# Patient Record
Sex: Female | Born: 1935 | Race: White | Hispanic: No | Smoking: Never smoker
Health system: Southern US, Community
[De-identification: ages and names within clinical notes are randomized; demographics above are authoritative.]

## PROBLEM LIST (undated history)

## (undated) DIAGNOSIS — E119 Type 2 diabetes mellitus without complications: Secondary | ICD-10-CM

## (undated) DIAGNOSIS — K219 Gastro-esophageal reflux disease without esophagitis: Secondary | ICD-10-CM

## (undated) DIAGNOSIS — R203 Hyperesthesia: Secondary | ICD-10-CM

## (undated) DIAGNOSIS — J302 Other seasonal allergic rhinitis: Secondary | ICD-10-CM

## (undated) DIAGNOSIS — C50919 Malignant neoplasm of unspecified site of unspecified female breast: Secondary | ICD-10-CM

## (undated) DIAGNOSIS — Z803 Family history of malignant neoplasm of breast: Secondary | ICD-10-CM

## (undated) DIAGNOSIS — M1990 Unspecified osteoarthritis, unspecified site: Secondary | ICD-10-CM

## (undated) DIAGNOSIS — E538 Deficiency of other specified B group vitamins: Secondary | ICD-10-CM

## (undated) DIAGNOSIS — Z923 Personal history of irradiation: Secondary | ICD-10-CM

## (undated) DIAGNOSIS — I493 Ventricular premature depolarization: Secondary | ICD-10-CM

## (undated) DIAGNOSIS — C50911 Malignant neoplasm of unspecified site of right female breast: Secondary | ICD-10-CM

## (undated) DIAGNOSIS — Z800 Family history of malignant neoplasm of digestive organs: Secondary | ICD-10-CM

## (undated) DIAGNOSIS — I491 Atrial premature depolarization: Secondary | ICD-10-CM

## (undated) HISTORY — DX: Family history of malignant neoplasm of breast: Z80.3

## (undated) HISTORY — DX: Deficiency of other specified B group vitamins: E53.8

## (undated) HISTORY — DX: Atrial premature depolarization: I49.1

## (undated) HISTORY — DX: Ventricular premature depolarization: I49.3

## (undated) HISTORY — PX: CATARACT EXTRACTION: SUR2

## (undated) HISTORY — DX: Family history of malignant neoplasm of digestive organs: Z80.0

---
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
## 1977-04-05 HISTORY — PX: ABDOMINAL HYSTERECTOMY: SHX81

## 1998-06-05 ENCOUNTER — Other Ambulatory Visit: Admission: RE | Admit: 1998-06-05 | Discharge: 1998-06-05 | Payer: Self-pay | Admitting: Obstetrics and Gynecology

## 1998-10-02 ENCOUNTER — Ambulatory Visit (HOSPITAL_COMMUNITY): Admission: RE | Admit: 1998-10-02 | Discharge: 1998-10-02 | Payer: Self-pay | Admitting: Cardiovascular Disease

## 1999-06-08 ENCOUNTER — Other Ambulatory Visit: Admission: RE | Admit: 1999-06-08 | Discharge: 1999-06-08 | Payer: Self-pay | Admitting: Obstetrics and Gynecology

## 2000-06-09 ENCOUNTER — Other Ambulatory Visit: Admission: RE | Admit: 2000-06-09 | Discharge: 2000-06-09 | Payer: Self-pay | Admitting: Obstetrics and Gynecology

## 2001-02-17 ENCOUNTER — Encounter: Payer: Self-pay | Admitting: Internal Medicine

## 2001-02-17 ENCOUNTER — Ambulatory Visit (HOSPITAL_COMMUNITY): Admission: RE | Admit: 2001-02-17 | Discharge: 2001-02-17 | Payer: Self-pay | Admitting: Internal Medicine

## 2001-06-02 ENCOUNTER — Encounter: Payer: Self-pay | Admitting: Internal Medicine

## 2001-06-02 ENCOUNTER — Ambulatory Visit (HOSPITAL_COMMUNITY): Admission: RE | Admit: 2001-06-02 | Discharge: 2001-06-02 | Payer: Self-pay | Admitting: Internal Medicine

## 2001-06-13 ENCOUNTER — Other Ambulatory Visit: Admission: RE | Admit: 2001-06-13 | Discharge: 2001-06-13 | Payer: Self-pay | Admitting: Obstetrics and Gynecology

## 2001-06-29 ENCOUNTER — Encounter: Payer: Self-pay | Admitting: Surgery

## 2001-06-29 ENCOUNTER — Ambulatory Visit (HOSPITAL_COMMUNITY): Admission: RE | Admit: 2001-06-29 | Discharge: 2001-06-29 | Payer: Self-pay | Admitting: Cardiology

## 2001-06-30 ENCOUNTER — Encounter: Payer: Self-pay | Admitting: Surgery

## 2001-06-30 ENCOUNTER — Encounter: Admission: RE | Admit: 2001-06-30 | Discharge: 2001-06-30 | Payer: Self-pay | Admitting: Surgery

## 2001-07-05 ENCOUNTER — Encounter: Payer: Self-pay | Admitting: Surgery

## 2001-07-07 ENCOUNTER — Inpatient Hospital Stay (HOSPITAL_COMMUNITY): Admission: RE | Admit: 2001-07-07 | Discharge: 2001-07-09 | Payer: Self-pay | Admitting: Surgery

## 2001-07-07 ENCOUNTER — Encounter (INDEPENDENT_AMBULATORY_CARE_PROVIDER_SITE_OTHER): Payer: Self-pay | Admitting: *Deleted

## 2001-07-07 HISTORY — PX: CHOLECYSTECTOMY, LAPAROSCOPIC: SHX56

## 2001-07-07 HISTORY — PX: LAPAROSCOPIC APPENDECTOMY: SHX408

## 2001-09-11 ENCOUNTER — Encounter: Payer: Self-pay | Admitting: Emergency Medicine

## 2001-09-11 ENCOUNTER — Inpatient Hospital Stay (HOSPITAL_COMMUNITY): Admission: EM | Admit: 2001-09-11 | Discharge: 2001-09-12 | Payer: Self-pay | Admitting: Emergency Medicine

## 2002-12-24 ENCOUNTER — Emergency Department (HOSPITAL_COMMUNITY): Admission: EM | Admit: 2002-12-24 | Discharge: 2002-12-24 | Payer: Self-pay | Admitting: Emergency Medicine

## 2003-03-06 ENCOUNTER — Ambulatory Visit (HOSPITAL_COMMUNITY): Admission: RE | Admit: 2003-03-06 | Discharge: 2003-03-06 | Payer: Self-pay | Admitting: Internal Medicine

## 2004-02-07 ENCOUNTER — Ambulatory Visit: Payer: Self-pay | Admitting: Internal Medicine

## 2004-03-10 ENCOUNTER — Ambulatory Visit: Payer: Self-pay | Admitting: Cardiology

## 2004-04-05 HISTORY — PX: ESOPHAGEAL DILATION: SHX303

## 2004-06-01 ENCOUNTER — Ambulatory Visit: Payer: Self-pay | Admitting: Internal Medicine

## 2004-06-15 ENCOUNTER — Ambulatory Visit: Payer: Self-pay | Admitting: Internal Medicine

## 2004-06-18 ENCOUNTER — Ambulatory Visit: Payer: Self-pay | Admitting: Internal Medicine

## 2004-06-21 ENCOUNTER — Emergency Department (HOSPITAL_COMMUNITY): Admission: EM | Admit: 2004-06-21 | Discharge: 2004-06-21 | Payer: Self-pay | Admitting: Emergency Medicine

## 2004-06-26 ENCOUNTER — Encounter: Admission: RE | Admit: 2004-06-26 | Discharge: 2004-06-26 | Payer: Self-pay | Admitting: Internal Medicine

## 2004-06-30 ENCOUNTER — Ambulatory Visit: Payer: Self-pay | Admitting: Internal Medicine

## 2004-06-30 ENCOUNTER — Encounter (INDEPENDENT_AMBULATORY_CARE_PROVIDER_SITE_OTHER): Payer: Self-pay | Admitting: *Deleted

## 2004-06-30 ENCOUNTER — Ambulatory Visit (HOSPITAL_COMMUNITY): Admission: RE | Admit: 2004-06-30 | Discharge: 2004-06-30 | Payer: Self-pay | Admitting: Internal Medicine

## 2004-09-15 ENCOUNTER — Ambulatory Visit: Payer: Self-pay | Admitting: Internal Medicine

## 2005-01-08 ENCOUNTER — Ambulatory Visit: Payer: Self-pay | Admitting: Internal Medicine

## 2005-03-17 ENCOUNTER — Ambulatory Visit: Payer: Self-pay | Admitting: Internal Medicine

## 2005-03-25 ENCOUNTER — Ambulatory Visit: Payer: Self-pay | Admitting: Cardiology

## 2005-09-02 ENCOUNTER — Ambulatory Visit: Payer: Self-pay | Admitting: Internal Medicine

## 2005-09-16 ENCOUNTER — Ambulatory Visit: Payer: Self-pay | Admitting: Internal Medicine

## 2005-09-21 ENCOUNTER — Ambulatory Visit: Payer: Self-pay | Admitting: Internal Medicine

## 2005-12-07 ENCOUNTER — Ambulatory Visit: Payer: Self-pay | Admitting: Internal Medicine

## 2005-12-20 ENCOUNTER — Ambulatory Visit: Payer: Self-pay

## 2005-12-20 ENCOUNTER — Encounter: Payer: Self-pay | Admitting: Cardiovascular Disease

## 2006-01-05 ENCOUNTER — Ambulatory Visit: Payer: Self-pay | Admitting: Cardiology

## 2006-02-17 ENCOUNTER — Ambulatory Visit: Payer: Self-pay | Admitting: Internal Medicine

## 2006-03-31 ENCOUNTER — Ambulatory Visit: Payer: Self-pay | Admitting: Cardiology

## 2006-04-19 ENCOUNTER — Ambulatory Visit: Payer: Self-pay | Admitting: Internal Medicine

## 2006-04-19 LAB — CONVERTED CEMR LAB: Vitamin B-12: 373 pg/mL (ref 211–911)

## 2006-04-27 ENCOUNTER — Ambulatory Visit: Payer: Self-pay | Admitting: Internal Medicine

## 2006-05-26 ENCOUNTER — Ambulatory Visit: Payer: Self-pay | Admitting: Internal Medicine

## 2006-06-02 ENCOUNTER — Ambulatory Visit: Payer: Self-pay | Admitting: Cardiology

## 2006-06-02 LAB — CONVERTED CEMR LAB
Alkaline Phosphatase: 55 units/L (ref 39–117)
Bilirubin, Direct: 0.2 mg/dL (ref 0.0–0.3)
LDL Cholesterol: 105 mg/dL — ABNORMAL HIGH (ref 0–99)
Total CHOL/HDL Ratio: 3.9
VLDL: 23 mg/dL (ref 0–40)

## 2006-06-10 ENCOUNTER — Encounter (INDEPENDENT_AMBULATORY_CARE_PROVIDER_SITE_OTHER): Payer: Self-pay | Admitting: *Deleted

## 2006-06-10 ENCOUNTER — Encounter: Admission: RE | Admit: 2006-06-10 | Discharge: 2006-06-10 | Payer: Self-pay | Admitting: Obstetrics and Gynecology

## 2006-06-10 ENCOUNTER — Encounter (INDEPENDENT_AMBULATORY_CARE_PROVIDER_SITE_OTHER): Payer: Self-pay | Admitting: Diagnostic Radiology

## 2006-06-13 HISTORY — PX: BREAST BIOPSY: SHX20

## 2006-06-23 ENCOUNTER — Ambulatory Visit: Payer: Self-pay | Admitting: Internal Medicine

## 2006-06-23 ENCOUNTER — Encounter: Admission: RE | Admit: 2006-06-23 | Discharge: 2006-06-23 | Payer: Self-pay | Admitting: Obstetrics and Gynecology

## 2006-06-30 ENCOUNTER — Encounter: Admission: RE | Admit: 2006-06-30 | Discharge: 2006-06-30 | Payer: Self-pay | Admitting: General Surgery

## 2006-07-04 ENCOUNTER — Ambulatory Visit (HOSPITAL_BASED_OUTPATIENT_CLINIC_OR_DEPARTMENT_OTHER): Admission: RE | Admit: 2006-07-04 | Discharge: 2006-07-04 | Payer: Self-pay | Admitting: General Surgery

## 2006-07-04 ENCOUNTER — Encounter (INDEPENDENT_AMBULATORY_CARE_PROVIDER_SITE_OTHER): Payer: Self-pay | Admitting: Specialist

## 2006-07-04 ENCOUNTER — Encounter: Admission: RE | Admit: 2006-07-04 | Discharge: 2006-07-04 | Payer: Self-pay | Admitting: General Surgery

## 2006-07-04 ENCOUNTER — Encounter (INDEPENDENT_AMBULATORY_CARE_PROVIDER_SITE_OTHER): Payer: Self-pay | Admitting: General Surgery

## 2006-07-04 HISTORY — PX: BREAST LUMPECTOMY: SHX2

## 2006-07-21 ENCOUNTER — Ambulatory Visit: Payer: Self-pay | Admitting: Oncology

## 2006-07-27 LAB — COMPREHENSIVE METABOLIC PANEL
AST: 14 U/L (ref 0–37)
Albumin: 4.2 g/dL (ref 3.5–5.2)
Alkaline Phosphatase: 61 U/L (ref 39–117)
BUN: 14 mg/dL (ref 6–23)
Calcium: 9.3 mg/dL (ref 8.4–10.5)
Chloride: 105 mEq/L (ref 96–112)
Potassium: 3.9 mEq/L (ref 3.5–5.3)
Sodium: 140 mEq/L (ref 135–145)
Total Protein: 6.6 g/dL (ref 6.0–8.3)

## 2006-07-27 LAB — CBC WITH DIFFERENTIAL/PLATELET
Basophils Absolute: 0.1 10*3/uL (ref 0.0–0.1)
EOS%: 3.1 % (ref 0.0–7.0)
Eosinophils Absolute: 0.2 10*3/uL (ref 0.0–0.5)
HGB: 13.1 g/dL (ref 11.6–15.9)
MCH: 32.4 pg (ref 26.0–34.0)
NEUT#: 2.5 10*3/uL (ref 1.5–6.5)
RBC: 4.04 10*6/uL (ref 3.70–5.32)
RDW: 13.7 % (ref 11.3–14.5)
lymph#: 1.8 10*3/uL (ref 0.9–3.3)

## 2006-07-28 ENCOUNTER — Ambulatory Visit: Payer: Self-pay | Admitting: Internal Medicine

## 2006-08-01 ENCOUNTER — Encounter: Admission: RE | Admit: 2006-08-01 | Discharge: 2006-08-01 | Payer: Self-pay | Admitting: Oncology

## 2006-08-22 DIAGNOSIS — I6529 Occlusion and stenosis of unspecified carotid artery: Secondary | ICD-10-CM | POA: Insufficient documentation

## 2006-08-30 ENCOUNTER — Ambulatory Visit: Payer: Self-pay | Admitting: Internal Medicine

## 2006-09-05 ENCOUNTER — Ambulatory Visit: Payer: Self-pay | Admitting: Oncology

## 2006-09-07 ENCOUNTER — Encounter: Payer: Self-pay | Admitting: Internal Medicine

## 2006-09-30 ENCOUNTER — Ambulatory Visit: Payer: Self-pay | Admitting: Internal Medicine

## 2006-10-18 ENCOUNTER — Telehealth (INDEPENDENT_AMBULATORY_CARE_PROVIDER_SITE_OTHER): Payer: Self-pay | Admitting: *Deleted

## 2006-10-31 ENCOUNTER — Ambulatory Visit: Payer: Self-pay | Admitting: Internal Medicine

## 2006-11-22 ENCOUNTER — Telehealth (INDEPENDENT_AMBULATORY_CARE_PROVIDER_SITE_OTHER): Payer: Self-pay | Admitting: *Deleted

## 2006-11-24 ENCOUNTER — Ambulatory Visit: Payer: Self-pay | Admitting: Internal Medicine

## 2006-11-24 ENCOUNTER — Ambulatory Visit: Payer: Self-pay | Admitting: Oncology

## 2006-11-24 LAB — CONVERTED CEMR LAB: Vitamin B-12: 497 pg/mL (ref 211–911)

## 2006-11-25 ENCOUNTER — Telehealth (INDEPENDENT_AMBULATORY_CARE_PROVIDER_SITE_OTHER): Payer: Self-pay | Admitting: *Deleted

## 2006-11-29 ENCOUNTER — Telehealth (INDEPENDENT_AMBULATORY_CARE_PROVIDER_SITE_OTHER): Payer: Self-pay | Admitting: *Deleted

## 2006-12-01 ENCOUNTER — Ambulatory Visit: Payer: Self-pay | Admitting: Internal Medicine

## 2006-12-30 ENCOUNTER — Ambulatory Visit: Payer: Self-pay | Admitting: Internal Medicine

## 2007-01-02 ENCOUNTER — Ambulatory Visit: Payer: Self-pay | Admitting: Internal Medicine

## 2007-01-02 DIAGNOSIS — R7989 Other specified abnormal findings of blood chemistry: Secondary | ICD-10-CM | POA: Insufficient documentation

## 2007-01-02 DIAGNOSIS — I10 Essential (primary) hypertension: Secondary | ICD-10-CM

## 2007-01-02 DIAGNOSIS — E782 Mixed hyperlipidemia: Secondary | ICD-10-CM | POA: Insufficient documentation

## 2007-01-02 DIAGNOSIS — D649 Anemia, unspecified: Secondary | ICD-10-CM

## 2007-01-09 ENCOUNTER — Encounter (INDEPENDENT_AMBULATORY_CARE_PROVIDER_SITE_OTHER): Payer: Self-pay | Admitting: *Deleted

## 2007-01-27 ENCOUNTER — Ambulatory Visit: Payer: Self-pay | Admitting: Internal Medicine

## 2007-01-31 ENCOUNTER — Encounter: Admission: RE | Admit: 2007-01-31 | Discharge: 2007-01-31 | Payer: Self-pay | Admitting: Oncology

## 2007-02-01 ENCOUNTER — Ambulatory Visit: Payer: Self-pay | Admitting: Internal Medicine

## 2007-02-06 ENCOUNTER — Ambulatory Visit: Payer: Self-pay | Admitting: Oncology

## 2007-02-14 ENCOUNTER — Ambulatory Visit: Payer: Self-pay | Admitting: Internal Medicine

## 2007-02-14 DIAGNOSIS — L658 Other specified nonscarring hair loss: Secondary | ICD-10-CM | POA: Insufficient documentation

## 2007-02-14 DIAGNOSIS — E039 Hypothyroidism, unspecified: Secondary | ICD-10-CM | POA: Insufficient documentation

## 2007-02-15 ENCOUNTER — Encounter (INDEPENDENT_AMBULATORY_CARE_PROVIDER_SITE_OTHER): Payer: Self-pay | Admitting: *Deleted

## 2007-02-27 ENCOUNTER — Ambulatory Visit: Payer: Self-pay | Admitting: Internal Medicine

## 2007-03-08 ENCOUNTER — Ambulatory Visit: Payer: Self-pay | Admitting: Cardiology

## 2007-03-14 ENCOUNTER — Ambulatory Visit: Payer: Self-pay

## 2007-03-14 ENCOUNTER — Encounter: Payer: Self-pay | Admitting: Cardiology

## 2007-03-28 ENCOUNTER — Ambulatory Visit: Payer: Self-pay | Admitting: Internal Medicine

## 2007-04-17 ENCOUNTER — Ambulatory Visit: Payer: Self-pay | Admitting: Cardiology

## 2007-04-17 LAB — CONVERTED CEMR LAB
BUN: 11 mg/dL (ref 6–23)
CO2: 31 meq/L (ref 19–32)
Calcium: 9.5 mg/dL (ref 8.4–10.5)
Creatinine, Ser: 1 mg/dL (ref 0.4–1.2)
Glucose, Bld: 118 mg/dL — ABNORMAL HIGH (ref 70–99)
TSH: 1.3 microintl units/mL (ref 0.35–5.50)

## 2007-04-20 ENCOUNTER — Ambulatory Visit: Payer: Self-pay | Admitting: Cardiology

## 2007-04-27 ENCOUNTER — Ambulatory Visit: Payer: Self-pay | Admitting: Internal Medicine

## 2007-04-27 DIAGNOSIS — K5730 Diverticulosis of large intestine without perforation or abscess without bleeding: Secondary | ICD-10-CM | POA: Insufficient documentation

## 2007-04-27 DIAGNOSIS — K219 Gastro-esophageal reflux disease without esophagitis: Secondary | ICD-10-CM

## 2007-04-27 DIAGNOSIS — K222 Esophageal obstruction: Secondary | ICD-10-CM | POA: Insufficient documentation

## 2007-05-03 ENCOUNTER — Ambulatory Visit: Payer: Self-pay | Admitting: Cardiology

## 2007-05-04 ENCOUNTER — Ambulatory Visit: Payer: Self-pay | Admitting: Internal Medicine

## 2007-05-09 ENCOUNTER — Ambulatory Visit (HOSPITAL_COMMUNITY): Admission: RE | Admit: 2007-05-09 | Discharge: 2007-05-09 | Payer: Self-pay | Admitting: Internal Medicine

## 2007-05-15 ENCOUNTER — Ambulatory Visit: Payer: Self-pay | Admitting: Oncology

## 2007-05-16 ENCOUNTER — Encounter: Payer: Self-pay | Admitting: Internal Medicine

## 2007-05-16 ENCOUNTER — Ambulatory Visit: Payer: Self-pay | Admitting: Internal Medicine

## 2007-05-18 LAB — CBC WITH DIFFERENTIAL/PLATELET
Eosinophils Absolute: 0.1 10*3/uL (ref 0.0–0.5)
LYMPH%: 35 % (ref 14.0–48.0)
MONO#: 0.4 10*3/uL (ref 0.1–0.9)
NEUT#: 2.7 10*3/uL (ref 1.5–6.5)
Platelets: 144 10*3/uL — ABNORMAL LOW (ref 145–400)
RBC: 3.59 10*6/uL — ABNORMAL LOW (ref 3.70–5.32)
RDW: 12.5 % (ref 11.3–14.5)
WBC: 4.9 10*3/uL (ref 3.9–10.0)
lymph#: 1.7 10*3/uL (ref 0.9–3.3)

## 2007-05-19 LAB — COMPREHENSIVE METABOLIC PANEL
AST: 14 U/L (ref 0–37)
CO2: 27 mEq/L (ref 19–32)
Chloride: 107 mEq/L (ref 96–112)
Glucose, Bld: 120 mg/dL — ABNORMAL HIGH (ref 70–99)
Potassium: 4.1 mEq/L (ref 3.5–5.3)
Sodium: 140 mEq/L (ref 135–145)

## 2007-05-25 ENCOUNTER — Encounter: Payer: Self-pay | Admitting: Internal Medicine

## 2007-05-30 ENCOUNTER — Ambulatory Visit: Payer: Self-pay | Admitting: Internal Medicine

## 2007-05-30 LAB — CONVERTED CEMR LAB: Vitamin B-12: 428 pg/mL (ref 211–911)

## 2007-06-01 ENCOUNTER — Ambulatory Visit: Payer: Self-pay | Admitting: Internal Medicine

## 2007-06-01 LAB — CONVERTED CEMR LAB
HDL: 48 mg/dL (ref >39.0)
Total CHOL/HDL Ratio: 3.8
Triglycerides: 183 mg/dL — ABNORMAL HIGH (ref 0–149)

## 2007-06-07 ENCOUNTER — Encounter: Admission: RE | Admit: 2007-06-07 | Discharge: 2007-06-07 | Payer: Self-pay | Admitting: Obstetrics and Gynecology

## 2007-06-08 ENCOUNTER — Ambulatory Visit: Payer: Self-pay | Admitting: Internal Medicine

## 2007-06-27 ENCOUNTER — Ambulatory Visit: Payer: Self-pay | Admitting: Internal Medicine

## 2007-07-10 ENCOUNTER — Ambulatory Visit: Payer: Self-pay | Admitting: Oncology

## 2007-07-18 ENCOUNTER — Ambulatory Visit: Payer: Self-pay | Admitting: Cardiology

## 2007-07-26 ENCOUNTER — Ambulatory Visit: Payer: Self-pay | Admitting: Cardiology

## 2007-07-27 ENCOUNTER — Ambulatory Visit: Payer: Self-pay | Admitting: Internal Medicine

## 2007-08-29 ENCOUNTER — Ambulatory Visit: Payer: Self-pay | Admitting: Gastroenterology

## 2007-08-29 ENCOUNTER — Ambulatory Visit: Payer: Self-pay | Admitting: Cardiology

## 2007-08-29 DIAGNOSIS — E538 Deficiency of other specified B group vitamins: Secondary | ICD-10-CM

## 2007-09-29 ENCOUNTER — Ambulatory Visit: Payer: Self-pay | Admitting: Gastroenterology

## 2007-10-20 ENCOUNTER — Encounter: Payer: Self-pay | Admitting: Internal Medicine

## 2007-10-30 ENCOUNTER — Ambulatory Visit: Payer: Self-pay | Admitting: Internal Medicine

## 2007-11-30 ENCOUNTER — Ambulatory Visit: Payer: Self-pay | Admitting: Internal Medicine

## 2007-12-29 ENCOUNTER — Ambulatory Visit: Payer: Self-pay | Admitting: Internal Medicine

## 2008-01-03 ENCOUNTER — Ambulatory Visit: Payer: Self-pay | Admitting: Oncology

## 2008-01-05 LAB — COMPREHENSIVE METABOLIC PANEL
Albumin: 3.9 g/dL (ref 3.5–5.2)
Alkaline Phosphatase: 42 U/L (ref 39–117)
BUN: 13 mg/dL (ref 6–23)
CO2: 26 mEq/L (ref 19–32)
Glucose, Bld: 118 mg/dL — ABNORMAL HIGH (ref 70–99)
Potassium: 4.3 mEq/L (ref 3.5–5.3)
Total Bilirubin: 0.6 mg/dL (ref 0.3–1.2)

## 2008-01-05 LAB — CBC WITH DIFFERENTIAL/PLATELET
Basophils Absolute: 0 10*3/uL (ref 0.0–0.1)
Eosinophils Absolute: 0.1 10*3/uL (ref 0.0–0.5)
HGB: 12.1 g/dL (ref 11.6–15.9)
LYMPH%: 31.9 % (ref 14.0–48.0)
MCV: 94.1 fL (ref 81.0–101.0)
MONO%: 10 % (ref 0.0–13.0)
NEUT#: 3.3 10*3/uL (ref 1.5–6.5)
Platelets: 143 10*3/uL — ABNORMAL LOW (ref 145–400)

## 2008-01-05 LAB — CANCER ANTIGEN 27.29: CA 27.29: 24 U/mL (ref 0–39)

## 2008-01-11 ENCOUNTER — Encounter: Payer: Self-pay | Admitting: Internal Medicine

## 2008-01-26 ENCOUNTER — Ambulatory Visit: Payer: Self-pay | Admitting: Internal Medicine

## 2008-02-22 ENCOUNTER — Ambulatory Visit: Payer: Self-pay | Admitting: Cardiology

## 2008-02-26 ENCOUNTER — Ambulatory Visit: Payer: Self-pay | Admitting: Internal Medicine

## 2008-03-27 ENCOUNTER — Ambulatory Visit: Payer: Self-pay | Admitting: Internal Medicine

## 2008-04-26 ENCOUNTER — Ambulatory Visit: Payer: Self-pay | Admitting: Internal Medicine

## 2008-05-09 ENCOUNTER — Ambulatory Visit: Payer: Self-pay | Admitting: Internal Medicine

## 2008-05-15 ENCOUNTER — Encounter: Admission: RE | Admit: 2008-05-15 | Discharge: 2008-05-15 | Payer: Self-pay | Admitting: Otolaryngology

## 2008-05-15 ENCOUNTER — Encounter: Payer: Self-pay | Admitting: Internal Medicine

## 2008-06-07 ENCOUNTER — Encounter (INDEPENDENT_AMBULATORY_CARE_PROVIDER_SITE_OTHER): Payer: Self-pay | Admitting: Diagnostic Radiology

## 2008-06-07 ENCOUNTER — Encounter: Admission: RE | Admit: 2008-06-07 | Discharge: 2008-06-07 | Payer: Self-pay | Admitting: Oncology

## 2008-06-07 ENCOUNTER — Encounter: Payer: Self-pay | Admitting: Oncology

## 2008-06-07 ENCOUNTER — Encounter: Payer: Self-pay | Admitting: Internal Medicine

## 2008-06-13 ENCOUNTER — Ambulatory Visit: Payer: Self-pay | Admitting: Oncology

## 2008-06-13 LAB — COMPREHENSIVE METABOLIC PANEL
ALT: 16 U/L (ref 0–35)
Albumin: 3.6 g/dL (ref 3.5–5.2)
Alkaline Phosphatase: 40 U/L (ref 39–117)
Glucose, Bld: 96 mg/dL (ref 70–99)
Potassium: 4.2 mEq/L (ref 3.5–5.3)
Sodium: 138 mEq/L (ref 135–145)
Total Bilirubin: 0.6 mg/dL (ref 0.3–1.2)
Total Protein: 6.4 g/dL (ref 6.0–8.3)

## 2008-06-13 LAB — CBC WITH DIFFERENTIAL/PLATELET
Basophils Absolute: 0 10*3/uL (ref 0.0–0.1)
EOS%: 2 % (ref 0.0–7.0)
Eosinophils Absolute: 0.1 10*3/uL (ref 0.0–0.5)
HGB: 12.4 g/dL (ref 11.6–15.9)
MCH: 32.6 pg (ref 25.1–34.0)
MCV: 93.1 fL (ref 79.5–101.0)
MONO%: 10.4 % (ref 0.0–14.0)
NEUT#: 3.2 10*3/uL (ref 1.5–6.5)
RBC: 3.82 10*6/uL (ref 3.70–5.45)
RDW: 12.9 % (ref 11.2–14.5)
lymph#: 2.2 10*3/uL (ref 0.9–3.3)

## 2008-06-13 LAB — LACTATE DEHYDROGENASE: LDH: 135 U/L (ref 94–250)

## 2008-06-14 ENCOUNTER — Encounter: Admission: RE | Admit: 2008-06-14 | Discharge: 2008-06-14 | Payer: Self-pay | Admitting: Oncology

## 2008-06-21 ENCOUNTER — Encounter: Payer: Self-pay | Admitting: Internal Medicine

## 2008-06-24 ENCOUNTER — Encounter: Admission: RE | Admit: 2008-06-24 | Discharge: 2008-06-24 | Payer: Self-pay | Admitting: Surgery

## 2008-06-26 HISTORY — PX: BREAST LUMPECTOMY: SHX2

## 2008-06-27 HISTORY — PX: BREAST BIOPSY: SHX20

## 2008-06-28 ENCOUNTER — Encounter (INDEPENDENT_AMBULATORY_CARE_PROVIDER_SITE_OTHER): Payer: Self-pay | Admitting: Surgery

## 2008-06-28 ENCOUNTER — Encounter: Payer: Self-pay | Admitting: Cardiology

## 2008-06-28 ENCOUNTER — Ambulatory Visit (HOSPITAL_BASED_OUTPATIENT_CLINIC_OR_DEPARTMENT_OTHER): Admission: RE | Admit: 2008-06-28 | Discharge: 2008-06-28 | Payer: Self-pay | Admitting: Surgery

## 2008-06-28 ENCOUNTER — Encounter: Admission: RE | Admit: 2008-06-28 | Discharge: 2008-06-28 | Payer: Self-pay | Admitting: Surgery

## 2008-07-17 ENCOUNTER — Encounter: Payer: Self-pay | Admitting: Cardiology

## 2008-07-17 ENCOUNTER — Encounter: Payer: Self-pay | Admitting: Internal Medicine

## 2008-07-17 LAB — CBC WITH DIFFERENTIAL/PLATELET
Basophils Absolute: 0 10*3/uL (ref 0.0–0.1)
Eosinophils Absolute: 0.1 10*3/uL (ref 0.0–0.5)
HCT: 34.9 % (ref 34.8–46.6)
HGB: 12.1 g/dL (ref 11.6–15.9)
MCH: 32.7 pg (ref 25.1–34.0)
MONO#: 0.6 10*3/uL (ref 0.1–0.9)
NEUT#: 3 10*3/uL (ref 1.5–6.5)
NEUT%: 55.6 % (ref 38.4–76.8)
RDW: 13 % (ref 11.2–14.5)
WBC: 5.3 10*3/uL (ref 3.9–10.3)
lymph#: 1.7 10*3/uL (ref 0.9–3.3)

## 2008-07-17 LAB — COMPREHENSIVE METABOLIC PANEL
Albumin: 4 g/dL (ref 3.5–5.2)
BUN: 12 mg/dL (ref 6–23)
CO2: 25 mEq/L (ref 19–32)
Calcium: 9.3 mg/dL (ref 8.4–10.5)
Chloride: 103 mEq/L (ref 96–112)
Creatinine, Ser: 1.03 mg/dL (ref 0.40–1.20)
Glucose, Bld: 149 mg/dL — ABNORMAL HIGH (ref 70–99)
Potassium: 3.8 mEq/L (ref 3.5–5.3)

## 2008-07-17 LAB — CANCER ANTIGEN 27.29: CA 27.29: 26 U/mL (ref 0–39)

## 2008-07-24 ENCOUNTER — Ambulatory Visit: Payer: Self-pay | Admitting: Genetic Counselor

## 2008-08-02 ENCOUNTER — Telehealth: Payer: Self-pay | Admitting: Cardiology

## 2008-08-07 ENCOUNTER — Encounter: Admission: RE | Admit: 2008-08-07 | Discharge: 2008-08-07 | Payer: Self-pay | Admitting: Obstetrics and Gynecology

## 2008-08-07 ENCOUNTER — Encounter: Payer: Self-pay | Admitting: Internal Medicine

## 2008-08-14 ENCOUNTER — Telehealth (INDEPENDENT_AMBULATORY_CARE_PROVIDER_SITE_OTHER): Payer: Self-pay | Admitting: *Deleted

## 2008-08-15 ENCOUNTER — Ambulatory Visit: Payer: Self-pay | Admitting: Internal Medicine

## 2008-08-15 LAB — CONVERTED CEMR LAB: Vit D, 25-Hydroxy: 25 ng/mL — ABNORMAL LOW (ref 30–89)

## 2008-08-18 LAB — CONVERTED CEMR LAB: Calcium: 9.3 mg/dL (ref 8.4–10.5)

## 2008-08-19 ENCOUNTER — Encounter (INDEPENDENT_AMBULATORY_CARE_PROVIDER_SITE_OTHER): Payer: Self-pay | Admitting: *Deleted

## 2008-08-19 DIAGNOSIS — I4949 Other premature depolarization: Secondary | ICD-10-CM | POA: Insufficient documentation

## 2008-10-04 ENCOUNTER — Ambulatory Visit: Payer: Self-pay | Admitting: Genetic Counselor

## 2008-10-09 ENCOUNTER — Ambulatory Visit: Payer: Self-pay | Admitting: Oncology

## 2008-10-09 LAB — CBC WITH DIFFERENTIAL/PLATELET
Basophils Absolute: 0 10*3/uL (ref 0.0–0.1)
Eosinophils Absolute: 0.2 10*3/uL (ref 0.0–0.5)
HGB: 12.3 g/dL (ref 11.6–15.9)
MCV: 94 fL (ref 79.5–101.0)
MONO#: 0.6 10*3/uL (ref 0.1–0.9)
MONO%: 10.2 % (ref 0.0–14.0)
NEUT#: 2.8 10*3/uL (ref 1.5–6.5)
RDW: 13.3 % (ref 11.2–14.5)
WBC: 5.4 10*3/uL (ref 3.9–10.3)

## 2008-10-10 LAB — CANCER ANTIGEN 27.29: CA 27.29: 24 U/mL (ref 0–39)

## 2008-10-10 LAB — VITAMIN D 25 HYDROXY (VIT D DEFICIENCY, FRACTURES): Vit D, 25-Hydroxy: 31 ng/mL (ref 30–89)

## 2008-10-16 ENCOUNTER — Encounter: Payer: Self-pay | Admitting: Cardiology

## 2008-10-16 ENCOUNTER — Telehealth: Payer: Self-pay | Admitting: Internal Medicine

## 2008-11-04 ENCOUNTER — Ambulatory Visit: Payer: Self-pay | Admitting: Internal Medicine

## 2008-11-05 LAB — CONVERTED CEMR LAB: Vitamin B-12: 335 pg/mL (ref 211–911)

## 2008-11-06 ENCOUNTER — Ambulatory Visit: Payer: Self-pay | Admitting: Internal Medicine

## 2008-11-28 ENCOUNTER — Encounter (INDEPENDENT_AMBULATORY_CARE_PROVIDER_SITE_OTHER): Payer: Self-pay | Admitting: *Deleted

## 2008-12-10 ENCOUNTER — Ambulatory Visit: Payer: Self-pay | Admitting: Internal Medicine

## 2009-01-09 ENCOUNTER — Ambulatory Visit: Payer: Self-pay | Admitting: Internal Medicine

## 2009-02-05 ENCOUNTER — Encounter: Payer: Self-pay | Admitting: Internal Medicine

## 2009-02-07 ENCOUNTER — Ambulatory Visit: Payer: Self-pay | Admitting: Internal Medicine

## 2009-02-24 ENCOUNTER — Ambulatory Visit: Payer: Self-pay | Admitting: Cardiology

## 2009-03-10 ENCOUNTER — Ambulatory Visit: Payer: Self-pay | Admitting: Internal Medicine

## 2009-04-03 ENCOUNTER — Ambulatory Visit: Payer: Self-pay | Admitting: Oncology

## 2009-04-07 ENCOUNTER — Ambulatory Visit: Payer: Self-pay | Admitting: Internal Medicine

## 2009-04-08 ENCOUNTER — Encounter: Payer: Self-pay | Admitting: Internal Medicine

## 2009-04-08 LAB — COMPREHENSIVE METABOLIC PANEL
AST: 15 U/L (ref 0–37)
Albumin: 4 g/dL (ref 3.5–5.2)
Alkaline Phosphatase: 55 U/L (ref 39–117)
BUN: 16 mg/dL (ref 6–23)
Calcium: 9 mg/dL (ref 8.4–10.5)
Creatinine, Ser: 1.01 mg/dL (ref 0.40–1.20)
Glucose, Bld: 166 mg/dL — ABNORMAL HIGH (ref 70–99)
Potassium: 3.9 mEq/L (ref 3.5–5.3)

## 2009-04-08 LAB — CBC WITH DIFFERENTIAL/PLATELET
Basophils Absolute: 0 10*3/uL (ref 0.0–0.1)
EOS%: 3.7 % (ref 0.0–7.0)
Eosinophils Absolute: 0.2 10*3/uL (ref 0.0–0.5)
HCT: 35 % (ref 34.8–46.6)
HGB: 12.2 g/dL (ref 11.6–15.9)
MCH: 32.8 pg (ref 25.1–34.0)
MCV: 93.7 fL (ref 79.5–101.0)
MONO%: 10.1 % (ref 0.0–14.0)
NEUT#: 2.8 10*3/uL (ref 1.5–6.5)
NEUT%: 47.8 % (ref 38.4–76.8)
Platelets: 170 10*3/uL (ref 145–400)

## 2009-04-10 ENCOUNTER — Ambulatory Visit: Payer: Self-pay | Admitting: Internal Medicine

## 2009-04-24 ENCOUNTER — Encounter: Payer: Self-pay | Admitting: Internal Medicine

## 2009-05-08 ENCOUNTER — Telehealth: Payer: Self-pay | Admitting: Internal Medicine

## 2009-05-12 ENCOUNTER — Ambulatory Visit: Payer: Self-pay | Admitting: Internal Medicine

## 2009-05-13 LAB — CONVERTED CEMR LAB: Vitamin B-12: 478 pg/mL (ref 211–911)

## 2009-06-09 ENCOUNTER — Encounter: Admission: RE | Admit: 2009-06-09 | Discharge: 2009-06-09 | Payer: Self-pay | Admitting: Oncology

## 2009-06-10 ENCOUNTER — Ambulatory Visit: Payer: Self-pay | Admitting: Internal Medicine

## 2009-06-11 ENCOUNTER — Ambulatory Visit: Payer: Self-pay | Admitting: Internal Medicine

## 2009-06-11 DIAGNOSIS — E1169 Type 2 diabetes mellitus with other specified complication: Secondary | ICD-10-CM | POA: Insufficient documentation

## 2009-06-11 DIAGNOSIS — E119 Type 2 diabetes mellitus without complications: Secondary | ICD-10-CM

## 2009-06-11 LAB — CONVERTED CEMR LAB
Cholesterol: 198 mg/dL (ref 0–200)
Glucose, Bld: 132 mg/dL — ABNORMAL HIGH (ref 70–99)
LDL Cholesterol: 117 mg/dL — ABNORMAL HIGH (ref 0–99)
Triglycerides: 142 mg/dL (ref 0.0–149.0)

## 2009-06-20 ENCOUNTER — Encounter: Payer: Self-pay | Admitting: Internal Medicine

## 2009-06-24 ENCOUNTER — Telehealth: Payer: Self-pay | Admitting: Cardiology

## 2009-07-02 ENCOUNTER — Telehealth: Payer: Self-pay | Admitting: Internal Medicine

## 2009-07-03 ENCOUNTER — Ambulatory Visit: Payer: Self-pay | Admitting: Internal Medicine

## 2009-07-03 DIAGNOSIS — K5900 Constipation, unspecified: Secondary | ICD-10-CM | POA: Insufficient documentation

## 2009-07-03 DIAGNOSIS — K6289 Other specified diseases of anus and rectum: Secondary | ICD-10-CM

## 2009-07-04 LAB — CONVERTED CEMR LAB
Basophils Relative: 0.8 % (ref 0.0–3.0)
Eosinophils Relative: 2.1 % (ref 0.0–5.0)
HCT: 38.9 % (ref 36.0–46.0)
Hemoglobin: 13.1 g/dL (ref 12.0–15.0)
Lymphs Abs: 1.7 10*3/uL (ref 0.7–4.0)
Monocytes Relative: 10.1 % (ref 3.0–12.0)
Platelets: 165 10*3/uL (ref 150.0–400.0)
RBC: 4.04 M/uL (ref 3.87–5.11)
WBC: 5.5 10*3/uL (ref 4.5–10.5)

## 2009-07-11 ENCOUNTER — Ambulatory Visit: Payer: Self-pay | Admitting: Internal Medicine

## 2009-07-30 ENCOUNTER — Ambulatory Visit: Payer: Self-pay | Admitting: Internal Medicine

## 2009-08-07 ENCOUNTER — Encounter: Payer: Self-pay | Admitting: Internal Medicine

## 2009-08-11 ENCOUNTER — Ambulatory Visit: Payer: Self-pay | Admitting: Internal Medicine

## 2009-08-27 ENCOUNTER — Encounter: Payer: Self-pay | Admitting: Internal Medicine

## 2009-09-09 ENCOUNTER — Ambulatory Visit: Payer: Self-pay | Admitting: Internal Medicine

## 2009-10-02 ENCOUNTER — Ambulatory Visit: Payer: Self-pay | Admitting: Oncology

## 2009-10-07 LAB — CBC WITH DIFFERENTIAL/PLATELET
BASO%: 0.6 % (ref 0.0–2.0)
Eosinophils Absolute: 0.2 10*3/uL (ref 0.0–0.5)
HCT: 34.6 % — ABNORMAL LOW (ref 34.8–46.6)
MCHC: 35.1 g/dL (ref 31.5–36.0)
MONO#: 0.6 10*3/uL (ref 0.1–0.9)
NEUT#: 2.5 10*3/uL (ref 1.5–6.5)
Platelets: 145 10*3/uL (ref 145–400)
RBC: 3.7 10*6/uL (ref 3.70–5.45)
WBC: 5.1 10*3/uL (ref 3.9–10.3)
lymph#: 1.8 10*3/uL (ref 0.9–3.3)

## 2009-10-08 LAB — COMPREHENSIVE METABOLIC PANEL
ALT: 13 U/L (ref 0–35)
AST: 17 U/L (ref 0–37)
Alkaline Phosphatase: 62 U/L (ref 39–117)
Sodium: 141 mEq/L (ref 135–145)
Total Bilirubin: 0.7 mg/dL (ref 0.3–1.2)
Total Protein: 6.5 g/dL (ref 6.0–8.3)

## 2009-10-08 LAB — VITAMIN D 25 HYDROXY (VIT D DEFICIENCY, FRACTURES): Vit D, 25-Hydroxy: 35 ng/mL (ref 30–89)

## 2009-10-10 ENCOUNTER — Ambulatory Visit: Payer: Self-pay | Admitting: Internal Medicine

## 2009-10-14 ENCOUNTER — Encounter: Payer: Self-pay | Admitting: Cardiology

## 2009-10-21 ENCOUNTER — Telehealth: Payer: Self-pay | Admitting: Internal Medicine

## 2009-10-28 ENCOUNTER — Ambulatory Visit: Payer: Self-pay | Admitting: Internal Medicine

## 2009-11-11 ENCOUNTER — Ambulatory Visit: Payer: Self-pay | Admitting: Internal Medicine

## 2009-11-12 LAB — CONVERTED CEMR LAB: Vitamin B-12: 425 pg/mL (ref 211–911)

## 2009-11-13 ENCOUNTER — Ambulatory Visit: Payer: Self-pay | Admitting: Internal Medicine

## 2009-12-10 ENCOUNTER — Ambulatory Visit: Payer: Self-pay | Admitting: Internal Medicine

## 2009-12-10 DIAGNOSIS — B0229 Other postherpetic nervous system involvement: Secondary | ICD-10-CM

## 2009-12-11 ENCOUNTER — Encounter: Payer: Self-pay | Admitting: Internal Medicine

## 2009-12-11 ENCOUNTER — Encounter: Admission: RE | Admit: 2009-12-11 | Discharge: 2009-12-11 | Payer: Self-pay | Admitting: Otolaryngology

## 2009-12-12 ENCOUNTER — Ambulatory Visit: Payer: Self-pay | Admitting: Internal Medicine

## 2009-12-19 ENCOUNTER — Telehealth: Payer: Self-pay | Admitting: Internal Medicine

## 2009-12-22 ENCOUNTER — Ambulatory Visit: Payer: Self-pay | Admitting: Internal Medicine

## 2009-12-22 DIAGNOSIS — R42 Dizziness and giddiness: Secondary | ICD-10-CM

## 2009-12-24 ENCOUNTER — Ambulatory Visit: Payer: Self-pay | Admitting: Cardiology

## 2009-12-25 ENCOUNTER — Telehealth: Payer: Self-pay | Admitting: Internal Medicine

## 2009-12-30 ENCOUNTER — Encounter: Admission: RE | Admit: 2009-12-30 | Discharge: 2009-12-30 | Payer: Self-pay | Admitting: Internal Medicine

## 2010-01-01 ENCOUNTER — Encounter: Payer: Self-pay | Admitting: Internal Medicine

## 2010-01-06 ENCOUNTER — Encounter: Payer: Self-pay | Admitting: Internal Medicine

## 2010-01-06 ENCOUNTER — Encounter
Admission: RE | Admit: 2010-01-06 | Discharge: 2010-03-25 | Payer: Self-pay | Source: Home / Self Care | Attending: Internal Medicine | Admitting: Internal Medicine

## 2010-01-15 ENCOUNTER — Ambulatory Visit: Payer: Self-pay | Admitting: Internal Medicine

## 2010-01-31 ENCOUNTER — Encounter: Payer: Self-pay | Admitting: Internal Medicine

## 2010-02-03 ENCOUNTER — Encounter: Payer: Self-pay | Admitting: Internal Medicine

## 2010-02-13 ENCOUNTER — Ambulatory Visit: Payer: Self-pay | Admitting: Internal Medicine

## 2010-02-19 ENCOUNTER — Encounter: Payer: Self-pay | Admitting: Internal Medicine

## 2010-03-02 ENCOUNTER — Encounter: Payer: Self-pay | Admitting: Cardiology

## 2010-03-02 ENCOUNTER — Ambulatory Visit: Payer: Self-pay | Admitting: Cardiology

## 2010-03-13 ENCOUNTER — Ambulatory Visit: Payer: Self-pay | Admitting: Internal Medicine

## 2010-04-14 ENCOUNTER — Ambulatory Visit
Admission: RE | Admit: 2010-04-14 | Discharge: 2010-04-14 | Payer: Self-pay | Source: Home / Self Care | Attending: Internal Medicine | Admitting: Internal Medicine

## 2010-05-03 LAB — CONVERTED CEMR LAB
Cholesterol, target level: 200 mg/dL
Cholesterol: 217 mg/dL (ref 0–200)
HDL: 56.2 mg/dL (ref >39.0)
Hgb A1c MFr Bld: 5.6 % (ref 4.6–6.0)
LDL Goal: 160 mg/dL
Triglycerides: 199 mg/dL — ABNORMAL HIGH (ref 0–149)
VLDL: 40 mg/dL (ref 0–40)

## 2010-05-06 ENCOUNTER — Other Ambulatory Visit: Payer: Self-pay

## 2010-05-06 ENCOUNTER — Ambulatory Visit: Admit: 2010-05-06 | Payer: Self-pay | Admitting: Internal Medicine

## 2010-05-07 NOTE — Assessment & Plan Note (Signed)
Summary: B12 SHOT  Nurse Visit   Allergies: 1)  ! Levaquin 2)  ! Pcn 3)  ! Cephalexin 4)  ! Macrodantin 5)  ! Prevacid 6)  ! Vioxx 7)  ! * Codiene 8)  ! * Statins  Medication Administration  Injection # 1:    Medication: Vit B12 1000 mcg    Diagnosis: B12 DEFICIENCY (ICD-266.2)    Route: IM    Site: L deltoid    Exp Date: 07/2011    Lot #: [id number]    Mfr: [employer]    Comments: pt to schedule next monthly b12 at front desk, pt also ask for refill.  I gave Ronny Bacon the information to refill    Patient tolerated injection without complications    Given by: Chales Abrahams CMA Duncan Dull) (December 12, 2009 10:42 AM)  Orders Added: 1)  Vit B12 1000 mcg [J3420]

## 2010-05-07 NOTE — Assessment & Plan Note (Signed)
Summary: MONTHLY B12 SHOT...LSW.  Nurse Visit   Allergies: 1)  ! Levaquin 2)  ! Pcn 3)  ! Cephalexin 4)  ! Macrodantin 5)  ! Prevacid 6)  ! Vioxx 7)  ! * Codiene 8)  ! * Statins  Medication Administration  Injection # 1:    Medication: Vit B12 1000 mcg    Diagnosis: B12 DEFICIENCY (ICD-266.2)    Route: IM    Site: L deltoid    Exp Date: 05/07/2011    Lot #: [id number]    Mfr: [employer]    Patient tolerated injection without complications    Given by: Harlow Mares CMA (AAMA) (Aug 11, 2009 10:08 AM)  Orders Added: 1)  Vit B12 1000 mcg [J3420]

## 2010-05-07 NOTE — Progress Notes (Signed)
Summary: labwork  Phone Note Call from Patient Call back at Home Phone [phone]   Caller: Patient Call For: Dr. Juanda Chance Reason for Call: Talk to Nurse Summary of Call: would like to have labwork for B12 levels before getting Februay injection Initial call taken by: Vallarie Mare,  May 08, 2009 2:32 PM  Follow-up for Phone Call        OK. B 12 level can be drawn prior to her injection. Orders in IDX and patient advised. Follow-up by: Hortense Ramal CMA Duncan Dull),  May 08, 2009 4:46 PM

## 2010-05-07 NOTE — Assessment & Plan Note (Signed)
Summary: RECTAL PAIN, BURNING AND ITCHING           Tami Jacobson   History of Present Illness Visit Type: Follow-up Visit Primary GI MD: [name] MD Primary Provider: Newt Lukes MD Requesting Provider: n/a Chief Complaint: Rectal pain, burning & itching : hemorrhoid History of Present Illness:   This is a 77 year old white female with vitamin B12 deficiency, irritable bowel syndrome and most recently rectal pain and itching. She describes intense pruritus at the rectal orifice, especially at night. She has used Analpram cream twice a day with some improvement. She called last week in distress complaining of rectal soreness. She has had burning with bowel movements. Her bowel habits are regular, taking fiber supplements and MiraLax. She had Hemoccult-positive stool on a home screening by Dr. Ambrose Mantle earlier this year. An exam by the physicians assistant in our office did not reproduce  positive results. The stools were Hemoccult negative at that point. She has a positive family history of colon cancer and her last colonoscopy in February 2009 showed diverticulosis but no polyps. There is a history of breast cancer in 2008.   GI Review of Systems      Denies abdominal pain, acid reflux, belching, bloating, chest pain, dysphagia with liquids, dysphagia with solids, heartburn, loss of appetite, nausea, vomiting, vomiting blood, weight loss, and  weight gain.      Reports hemorrhoids and  rectal pain.     Denies anal fissure, black tarry stools, change in bowel habit, constipation, diarrhea, diverticulosis, fecal incontinence, heme positive stool, irritable bowel syndrome, jaundice, light color stool, liver problems, and  rectal bleeding.    Current Medications (verified): 1)  Toprol Xl 50 Mg  Tb24 (Metoprolol Succinate) .Marland Kitchen.. 1 Tab By Mouth Once Daily 2)  Sm Vitamin B-12 Tr 1000 Mcg  Tbcr (Cyanocobalamin) .Marland Kitchen.. 1042mcg/1cc Im Qmonth 3)  Prilosec 20 Mg  Cpdr (Omeprazole) .... Take 1 Tablet By  Mouth Once A Day 4)  Flecainide Acetate 50 Mg Tabs (Flecainide Acetate) .Marland Kitchen.. 1 Tab By Mouth Two Times A Day 5)  Anastrozole 1 Mg Tabs (Anastrozole) .Marland Kitchen.. 1 Tab By Mouth Once Daily 6)  Analpram-Hc 1-2.5 % Crea (Hydrocortisone Ace-Pramoxine) .... Apply To Affected Area of Rectum Two Times A Day As Needed 7)  Clobetasol Propionate 0.05 % Crea (Clobetasol Propionate) .... Use On Rash As Needed  Allergies (verified): 1)  ! Levaquin 2)  ! Pcn 3)  ! Cephalexin 4)  ! Macrodantin 5)  ! Prevacid 6)  ! Vioxx 7)  ! * Codiene 8)  ! * Statins  Past History:  Past Medical History: Reviewed history from 06/10/2009 and no changes required. 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 Cath neg 2000; esophageal stricture 2006 Colon polypectomy Lumpectomy X2 Hysterectomy - 1979 Appendectomy - 2002 Tonsillectomy - 1942 Gall Bladder - 2002  Family History: Reviewed history from 06/08/2007 and no changes required. family history coronary artery bypass graft family history DVT family history neoplasm, colon father suicide sister breast cancer has three sisters with breast cancer mother colon cancer sister diverticulitis maternal uncle CABG  Social History: Reviewed history from 06/10/2009 and no changes required.  Never Smoked Alcohol use-no Retired [employer] operational officer Married, lives with spouse Regular Exercise - yes Drug Use - no  Review of Systems       The patient  complains of allergy/sinus, anemia, arthritis/joint pain, back pain, heart rhythm changes, and sleeping problems.  The patient denies anxiety-new, blood in urine, breast changes/lumps, change in vision, confusion, cough, coughing up blood, depression-new, fainting, fatigue, fever, headaches-new, hearing problems, heart murmur, itching, menstrual pain, muscle pains/cramps, night sweats, nosebleeds, pregnancy symptoms, shortness of breath, skin rash, sore throat, swelling of feet/legs, swollen lymph glands, thirst - excessive , urination - excessive , urination changes/pain, urine leakage, vision changes, and voice change.         Pertinent positive and negative review of systems were noted in the above HPI. All other ROS was otherwise negative.   Vital Signs:  Patient profile:   77 year old female Height:      63 inches Weight:      160 pounds BMI:     28.45 Pulse rate:   72 / minute Pulse rhythm:   regular BP sitting:   110 / 58  (left arm) Cuff size:   regular  Vitals Entered By: June McMurray CMA Duncan Dull) (October 28, 2009 10:10 AM)  Physical Exam  General:  Well developed, well nourished, no acute distress. Head:  small circular area on the occipital area on the base of the scalp which looks like psoriasis. It measures 3 cm in diameter and it is scaly and red. Eyes:  PERRLA, no icterus. Mouth:  No deformity or lesions, dentition normal. Neck:  Supple; no masses or thyromegaly. Lungs:  Clear throughout to auscultation. Heart:  Regular rate and rhythm; no murmurs, rubs,  or bruits. Abdomen:  Soft, nontender and nondistended. No masses, hepatosplenomegaly or hernias noted. Normal bowel sounds. Rectal:  rectal and anoscopic exam reveals excoriated dry perianal mucosa outside of the rectal os with no active bleeding. There is no exudate or weeping. Anal canal is normal, there are 2 small first-grade hemorrhoids internally which are not active. A small amount of stool specimen is Hemoccult  negative. Extremities:  No clubbing, cyanosis, edema or deformities noted. Skin:  Intact without significant lesions or rashes. Psych:  Alert and cooperative. Normal mood and affect.   Impression & Recommendations:  Problem # 1:  FAMILY HX COLON CANCER (ICD-V16.0)  Hemoccult-positive stool on recent home  exam by Dr. Betsy Pries, we were not able to reproduce it in  the office. Possibly related to anal irritation. Patient is quite concerned about the possibility of colon polyps or colon cancer and for that reason we will go ahead and schedule her for a colonoscopy.  Orders: Colonoscopy (Colon)  Problem # 2:  RECTAL PAIN (ZOX-096.04)  Patient has rectal pruritus and irritation with  excoriation around the rectum with a similar lesion at  the  base of the skull. We need to rule out psoriasis around the rectum. Patient has an appointment with Dr. Karlyn Agee in dermatology for her skin lesion on the head. I have asked her to bring it to Dr. Edward Qualia attention as well. She will continue on Analpram cream 3 times a day and use Anusol-HC suppositories for small hemorrhoids.  Orders: Colonoscopy (Colon)  Patient Instructions: 1)  continue Analpram cream 2.5% 3 times a day. 2)  Anusol-HC suppositories one q.h.s. 3)  Colonoscopy. 4)  Keep appointment with Dr. Yetta Barre for skin lesions to rule out psoriasis. 5)  Copy sent to : Dr T.Henley, Dr D.Jones,  Derm 6)  The medication list was reviewed and reconciled.  All changed / newly prescribed medications were explained.  A complete medication list was provided to the patient / caregiver. Prescriptions: ANUSOL-HC 25 MG SUPP (HYDROCORTISONE ACETATE) Insert 1 suppository into rectum at bedtime  #12 x 1   Entered by:   Lamona Curl CMA (AAMA)   Authorized by:   Hart Carwin MD   Signed by:   Lamona Curl CMA (AAMA) on 10/28/2009   Method used:   Electronically to        [facility] (retail)       [address]       [place], [place]  [address]       Ph: [phone]       Fax: [phone]   RxID:   [id number] ANALPRAM-HC 1-2.5 % CREA (HYDROCORTISONE ACE-PRAMOXINE) Apply to affected area of rectum three times a day as needed  #30 grams x 1   Entered by:   Lamona Curl CMA (AAMA)   Authorized by:   Hart Carwin MD   Signed by:   Lamona Curl CMA (AAMA) on 10/28/2009   Method used:   Electronically to        [facility] (retail)       [address]       [place], [place]  [address]       Ph: [phone]       Fax: [phone]   RxID:   [id number] DULCOLAX 5 MG  TBEC (BISACODYL) Day before procedure take 2 at 3pm and 2 at 8pm.  #4 x 0   Entered by:   Lamona Curl CMA (AAMA)   Authorized by:   Hart Carwin MD   Signed by:   Lamona Curl CMA (AAMA) on 10/28/2009   Method used:   Electronically to        [facility] (retail)       [address]       [place], [place]  [address]       Ph: [phone]       Fax: [phone]   RxID:   [id number] REGLAN 10 MG  TABS (METOCLOPRAMIDE HCL) As per prep instructions.  #2 x 0   Entered by:   Lamona Curl CMA (AAMA)   Authorized by:   Hart Carwin MD   Signed by:   Lamona Curl CMA (AAMA) on 10/28/2009   Method used:   Electronically to        [facility] (retail)       [address]       [place], [place]  [address]       Ph: [phone]       Fax: [phone]   RxID:   [id number] MIRALAX   POWD (POLYETHYLENE GLYCOL 3350) As per prep  instructions.  #255 grams x 0   Entered by:   Lamona Curl CMA (AAMA)   Authorized by:   Hart Carwin MD   Signed by:   Lamona Curl CMA (AAMA) on 10/28/2009   Method used:   Electronically to        [facility] (retail)       [address]  [place], [place]  [address]       Ph: [phone]       Fax:  [phone]   RxID:   [id number]

## 2010-05-07 NOTE — Progress Notes (Signed)
Summary: MRI  ---- Converted from flag ---- ---- 12/25/2009 10:09 AM, Verdell Face wrote: Valentina Gu,  Please call Marzelle @[phone] regarding MRI. She said she spoke w/you yesterday & you told her to call her back re: MRI.  Thanks, Elnita Maxwell ------------------------------  Phone Note Other Incoming   Summary of Call: ok - will clarify order  for open MRI brain (ordered 9/21) - thanks Initial call taken by: Newt Lukes MD,  December 25, 2009 11:51 AM    Additional Follow-up for Phone Call Additional follow up Details #2::    Called pt back she states she would like to go ahead and get the MRI done, but she wanted to let md know that she will need a open MRI she is claustophobic Follow-up by: Orlan Leavens RMA,  December 25, 2009 11:17 AM

## 2010-05-07 NOTE — Miscellaneous (Signed)
Summary: PT/[place]  PT/[place]   Imported By: Sherian Rein 01/13/2010 08:58:02  _____________________________________________________________________  External Attachment:    Type:   Image     Comment:   External Document

## 2010-05-07 NOTE — Letter (Signed)
Summary: [facility]   Imported By: Lanelle Bal 04/01/2010 09:33:01  _____________________________________________________________________  External Attachment:    Type:   Image     Comment:   External Document

## 2010-05-07 NOTE — Progress Notes (Signed)
Summary: TRIAGE  Phone Note Call from Patient Call back at Home Phone [phone]   Caller: Patient Call For: Dr. Juanda Chance Reason for Call: Talk to Nurse Summary of Call: rectal pain, itching, burning. Initial call taken by: Vallarie Mare,  October 21, 2009 9:38 AM  Follow-up for Phone Call        Last OV 07-30-09.  Pt. c/o 2 monthes of rectal itching,pain and burning. Rectum is very raw. Pt. states she gets a bit better, but then it gets worse again. Pt. sees occ. blood on the tissue. Pt. has used Analpram cream, done sitz baths and tried to use Calmoseptine ointment. She takes Benefiber QAM and Metamucil QPM. "I would like for her to check me!"   DR.Jimi Giza PLEASE ADVISE  Follow-up by: Laureen Ochs LPN,  October 21, 2009 10:49 AM  Additional Follow-up for Phone Call Additional follow up Details #1::        OK I will check her but not an emergency. First available Additional Follow-up by: Hart Carwin MD,  October 21, 2009 1:04 PM     Appended Document: Domingo Dimes Above MD orders reviewed with patient. She will see Dr.Nyiah Pianka on 10-28-09 at 10am. Pt. instructed to call back as needed.

## 2010-05-07 NOTE — Assessment & Plan Note (Signed)
Summary: MONTHLY B-12 SHOT...LSW.  Nurse Visit   Allergies: 1)  ! Levaquin 2)  ! Pcn 3)  ! Cephalexin 4)  ! Macrodantin 5)  ! Prevacid 6)  ! Vioxx 7)  ! * Codiene 8)  ! * Statins  Medication Administration  Injection # 1:    Medication: Vit B12 1000 mcg    Diagnosis: B12 DEFICIENCY (ICD-266.2)    Route: IM    Site: R deltoid    Exp Date: 2/13    Lot #: [id number]    Mfr: [employer]    Patient tolerated injection without complications    Given by: Milford Cage NCMA (July 11, 2009 10:30 AM)  Orders Added: 1)  Vit B12 1000 mcg [J3420]

## 2010-05-07 NOTE — Progress Notes (Signed)
Summary: speak to nurse/calling about a Rx  Phone Note Call from Patient Call back at Home Phone [phone]   Reason for Call: Talk to Nurse Summary of Call: request to speak to nurse, would not state why Initial call taken by: Migdalia Dk,  June 24, 2009 8:38 AM  Follow-up for Phone Call        spoke with pt, when she received her toprol from [facility] it was metoprolol instead. pt does not want the generic. will resend the script with dispense as written. Deliah Goody, RN  June 24, 2009 12:11 PM      Prescriptions: TOPROL XL 50 MG  TB24 (METOPROLOL SUCCINATE) 1 tab by mouth once daily  #90 x 3   Entered by:   Deliah Goody, RN   Authorized by:   Ferman Hamming, MD, [facility]   Signed by:   Deliah Goody, RN on 06/24/2009   Method used:   Electronically to        [facility] (mail-order)             ,          Ph: [phone]       Fax: [phone]   RxID:   [id number]

## 2010-05-07 NOTE — Letter (Signed)
Summary: [facility] Instructions  [facility]  [address] [place], [place] [address]   Phone: [phone]  Fax: [phone]       Tami Jacobson    1927-08-15    MRN: [id number]       Procedure Day /Date: 11/13/09 Thursday     Arrival Time: 1:30 pm     Procedure Time: 2:30 pm     Location of Procedure:                    _x _  [facility] (4th Floor)  PREPARATION FOR COLONOSCOPY WITH MIRALAX  Starting 5 days prior to your procedure (11/09/09) do not eat nuts, seeds, popcorn, corn, beans, peas,  salads, or any raw vegetables.  Do not take any fiber supplements (e.g. Metamucil, Citrucel, and Benefiber). ____________________________________________________________________________________________________   THE DAY BEFORE YOUR PROCEDURE         DATE: 11/12/09 DAY: Wednesday  1   Drink clear liquids the entire day-NO SOLID FOOD  2   Do not drink anything colored red or purple.  Avoid juices with pulp.  No orange juice.  3   Drink at least 64 oz. (8 glasses) of fluid/clear liquids during the day to prevent dehydration and help the prep work efficiently.  CLEAR LIQUIDS INCLUDE: Water Jello Ice Popsicles Tea (sugar ok, no milk/cream) Powdered fruit flavored drinks Coffee (sugar ok, no milk/cream) Gatorade Juice: apple, white grape, white cranberry  Lemonade Clear bullion, consomm, broth Carbonated beverages (any kind) Strained chicken noodle soup Hard Candy  4   Mix the entire bottle of Miralax with 64 oz. of Gatorade/Powerade in the morning and put in the refrigerator to chill.  5   At 3:00 pm take 2 Dulcolax/Bisacodyl tablets.  6   At 4:30 pm take one Reglan/Metoclopramide tablet.  7  Starting at 5:00 pm drink one 8 oz glass of the Miralax mixture every 15-20 minutes until you have finished drinking the entire 64 oz.  You should finish drinking prep around 7:30 or 8:00 pm.  8   If you are nauseated, you may take the 2nd Reglan/Metoclopramide  tablet at 6:30 pm.        9    At 8:00 pm take 2 more DULCOLAX/Bisacodyl tablets.        THE DAY OF YOUR PROCEDURE      DATE:  11/13/09 DAY: Thursday  You may drink clear liquids until 12:30 pm  (2 HOURS BEFORE PROCEDURE).   MEDICATION INSTRUCTIONS  Unless otherwise instructed, you should take regular prescription medications with a small sip of water as early as possible the morning of your procedure.        OTHER INSTRUCTIONS  You will need a responsible adult at least 75 years of age to accompany you and drive you home.   This person must remain in the waiting room during your procedure.  Wear loose fitting clothing that is easily removed.  Leave jewelry and other valuables at home.  However, you may wish to bring a book to read or an iPod/MP3 player to listen to music as you wait for your procedure to start.  Remove all body piercing jewelry and leave at home.  Total time from sign-in until discharge is approximately 2-3 hours.  You should go home directly after your procedure and rest.  You can resume normal activities the day after your procedure.  The day of your procedure you should not:   Drive   Make  legal decisions   Operate machinery   Drink alcohol   Return to work  You will receive specific instructions about eating, activities and medications before you leave.   The above instructions have been reviewed and explained to me by   Lamona Curl CMA Duncan Dull)  October 28, 2009 11:25 AM     I fully understand and can verbalize these instructions _____________________________ Date 10/28/09

## 2010-05-07 NOTE — Assessment & Plan Note (Signed)
Summary: MONTHLY B12 SHOT...LSW.  Nurse Visit   Allergies: 1)  ! Levaquin 2)  ! Pcn 3)  ! Cephalexin 4)  ! Macrodantin 5)  ! Prevacid 6)  ! Vioxx 7)  ! * Codiene 8)  ! * Statins  Medication Administration  Injection # 1:    Medication: Vit B12 1000 mcg    Diagnosis: B12 DEFICIENCY (ICD-266.2)    Route: IM    Site: L deltoid    Exp Date: 10/12    Lot #: [id number]    Mfr: [employer]    Patient tolerated injection without complications    Given by: Hortense Ramal CMA Duncan Dull) (April 10, 2009 9:35 AM)  Orders Added: 1)  Vit B12 1000 mcg [J3420]

## 2010-05-07 NOTE — Assessment & Plan Note (Signed)
Summary: monthly b12 inj.  Nurse Visit   Allergies: 1)  ! Levaquin 2)  ! Pcn 3)  ! Cephalexin 4)  ! Macrodantin 5)  ! Prevacid 6)  ! Vioxx 7)  ! * Codiene 8)  ! * Statins  Medication Administration  Injection # 1:    Medication: Vit B12 1000 mcg    Diagnosis: B12 DEFICIENCY (ICD-266.2)    Route: IM    Site: L deltoid    Exp Date: 11/12    Lot #: [id number]    Mfr: [employer]    Patient tolerated injection without complications    Given by: Milford Cage NCMA (May 12, 2009 9:50 AM)  Orders Added: 1)  Vit B12 1000 mcg [J3420]

## 2010-05-07 NOTE — Procedures (Signed)
Summary: Colonoscopy  Patient: Tami Jacobson Note: All result statuses are Final unless otherwise noted.  Tests: (1) Colonoscopy (COL)   COL Colonoscopy           DONE     [facility]     [address]     [place], [place]  [address]           COLONOSCOPY PROCEDURE REPORT           PATIENT:  [name], [name]  MR#:  [id number]     BIRTHDATE:  December 11, 1927, 82 yrs. old  GENDER:  female     ENDOSCOPIST:  Hedwig Morton. Juanda Chance, MD     REF. BY:  Tracey Harries, M.D.     PROCEDURE DATE:  11/13/2009     PROCEDURE:  Colonoscopy 08657     ASA CLASS:  Class II     INDICATIONS:  F hx of colon cancer,     heme positive stool on home test     pruritus recti     MEDICATIONS:   Versed 9 mg, Fentanyl 75 mcg           DESCRIPTION OF PROCEDURE:   After the risks benefits and     alternatives of the procedure were thoroughly explained, informed     consent was obtained.  Digital rectal exam was performed and     revealed no rectal masses.   The LB [id number] endoscope     was introduced through the anus and advanced to the cecum, which     was identified by both the appendix and ileocecal valve, without     limitations.  The quality of the prep was excellent, using     MiraLax.  The instrument was then slowly withdrawn as the colon     was fully examined.     <<PROCEDUREIMAGES>>           FINDINGS:  Moderate diverticulosis was found in the sigmoid colon     (see image4). narrow lumen of the sigmoid colon, unable to pass     adult scope, but pediatric scope passed  This was otherwise a     normal examination of the colon (see image5, image3, image2, and     image1).   Retroflexed views in the rectum revealed no     abnormalities.    The scope was then withdrawn from the patient     and the procedure completed.           COMPLICATIONS:  None     ENDOSCOPIC IMPRESSION:     1) Moderate diverticulosis in the sigmoid colon     2) Otherwise normal examination     nothing to account for  heme positive stool,     suspect ano-rwctal source     RECOMMENDATIONS:     continue Analpram cream 2.5 %, 15 gm, apply bid, 2 refills     REPEAT EXAM:  In 10 year(s) for.           ______________________________     Hedwig Morton. Juanda Chance, MD           CC:           n.     eSIGNED:   Hedwig Morton. Brodie at 11/13/2009 03:15 PM           [name], [id number]  Note: An exclamation mark (!) indicates a result that was not dispersed into the flowsheet. Document Creation Date: 11/13/2009 3:15 PM _______________________________________________________________________  (  1) Order result status: Final Collection or observation date-time: 11/13/2009 15:05 Requested date-time:  Receipt date-time:  Reported date-time:  Referring Physician:   Ordering Physician: [name] [phone]) Specimen Source:  Source: [name] Order Number: [id number] Lab site:   Appended Document: Colonoscopy    Clinical Lists Changes  Observations: Added new observation of COLONNXTDUE: 11/04/2019 (11/13/2009 16:45)

## 2010-05-07 NOTE — Letter (Signed)
Summary: [facility]  [facility]   Imported By: Lennie Odor 03/04/2010 14:30:24  _____________________________________________________________________  External Attachment:    Type:   Image     Comment:   External Document

## 2010-05-07 NOTE — Miscellaneous (Signed)
Summary: Discharge from PT/[place]  Discharge from PT/[place]   Imported By: Sherian Rein 02/11/2010 09:59:50  _____________________________________________________________________  External Attachment:    Type:   Image     Comment:   External Document

## 2010-05-07 NOTE — Assessment & Plan Note (Signed)
Summary: B12 SHOT  Nurse Visit   Allergies: 1)  ! Levaquin 2)  ! Pcn 3)  ! Cephalexin 4)  ! Macrodantin 5)  ! Prevacid 6)  ! Vioxx 7)  ! * Codiene 8)  ! * Statins  Medication Administration  Injection # 1:    Medication: Vit B12 1000 mcg    Diagnosis: B12 DEFICIENCY (ICD-266.2)    Route: IM    Site: L deltoid    Exp Date: 4/13    Lot #: [id number]    Mfr: [employer]    Patient tolerated injection without complications    Given by: Karen Kitchens Nelson-Smith CMA (AAMA) (November 11, 2009 10:04 AM)  Orders Added: 1)  Vit B12 1000 mcg [J3420]

## 2010-05-07 NOTE — Assessment & Plan Note (Signed)
Summary: MONTHLY B12 SHOT  Nurse Visit   Allergies: 1)  ! Levaquin 2)  ! Pcn 3)  ! Cephalexin 4)  ! Macrodantin 5)  ! Prevacid 6)  ! Vioxx 7)  ! * Codiene 8)  ! * Statins  Medication Administration  Injection # 1:    Medication: Vit B12 1000 mcg    Diagnosis: B12 DEFICIENCY (ICD-266.2)    Route: IM    Site: R deltoid    Exp Date: 01/04/2012    Lot #: [id number]    Mfr: [employer]    Patient tolerated injection without complications    Given by: Graciella Freer RN (April 14, 2010 10:45 AM)  Orders Added: 1)  Vit B12 1000 mcg [J3420]  Patient taken to scheduler for next B12 injection. Lab entered for next serum B12 level. Graciella Freer RN  April 14, 2010 10:55 AM

## 2010-05-07 NOTE — Assessment & Plan Note (Signed)
Summary: STILL HAVING NAUSEA, DIZZINESS, EARACHE /NWS   Vital Signs:  Patient profile:   77 year old female Height:      63 inches (160.02 cm) Weight:      151.12 pounds (68.69 kg) O2 Sat:      94 % on Room air Temp:     98.2 degrees F (36.78 degrees C) oral Pulse rate:   76 / minute BP sitting:   140 / 72  (left arm) Cuff size:   regular  Vitals Entered By: Orlan Leavens RMA (December 22, 2009 11:17 AM)  O2 Flow:  Room air CC: Ongoing dizziness and (L) earache Is Patient Diabetic? Yes Did you bring your meter with you today? No Pain Assessment Patient in pain? yes     Location: (L) ear Type: sharp/aching   Primary Care Provider:  Newt Lukes MD  CC:  Ongoing dizziness and (L) earache.  History of Present Illness: here for f/u head pain - onset 16d ago - seen here 10d ago for same pain located behind and inside left ear course is progressively worse since onset- pain is constant and pain is severe 9/10 -  symptoms not improved with aleve, meclizine or gabapentin (for poss herpetic pain) saw ENT 17d ago - s/p zpack for throat w/o change or improvement in ear symptoms and pain mild hearing change "plugged and full feeling" - no central HA or vision change - no fever or rash or blisters - a/w nausea and vertigo with any head movement - dizziness improved with rest  reviewed chronic med issues: 1) breast cancer - initial dx 3/08 on left - then right side 3/10 - tol armidex following lumpectomy - s/p XRT - follows with onc regularly - mammo at breast center -  2) HTN - reports compliance with ongoing medical treatment and no changes in medication dose or frequency. denies adverse side effects related to current therapy. symptoms of PVCs managed with flecanide  3) GERD with B12 defic - follows closely with GI - regular shots for B12 replacment and takes PPI once daily - reports compliance with ongoing medical treatment and no changes in medication dose or frequency.  denies adverse side effects related to current therapy.   4) hx hypothyroid - on synthroid > 18yr but none in past 6-57yr - denies sking or weight changes , no depression or mood changes- normal TSH 06/2009  5) dyslipidemia - intol of all statins - no specific med tx at this time - tries to monitor with diet and exercise, esp with spouse's DM diet  Current Medications (verified): 1)  Toprol Xl 50 Mg  Tb24 (Metoprolol Succinate) .Marland Kitchen.. 1 Tab By Mouth Once Daily 2)  Sm Vitamin B-12 Tr 1000 Mcg  Tbcr (Cyanocobalamin) .Marland Kitchen.. 1089mcg/1cc Im Qmonth 3)  Prilosec 20 Mg  Cpdr (Omeprazole) .... Take 1 Tablet By Mouth Once A Day 4)  Flecainide Acetate 50 Mg Tabs (Flecainide Acetate) .Marland Kitchen.. 1 Tab By Mouth Two Times A Day 5)  Anastrozole 1 Mg Tabs (Anastrozole) .Marland Kitchen.. 1 Tab By Mouth Once Daily 6)  Analpram-Hc 1-2.5 % Crea (Hydrocortisone Ace-Pramoxine) .... Apply To Affected Area of Rectum Three Times A Day As Needed 7)  Clobetasol Propionate 0.05 % Crea (Clobetasol Propionate) .... Use On Rash As Needed 8)  Anusol-Hc 25 Mg Supp (Hydrocortisone Acetate) .... Insert 1 Suppository Into Rectum At Bedtime 9)  Analpram-Hc Singles 1-2.5 %  Crea (Hydrocortisone Ace-Pramoxine) .... Apply To Rectum 2-3 Times Per Day Analpram Cream 2.5% ,15gm ,  Apply Two Times A Day 10)  Aleve 220 Mg Tabs (Naproxen Sodium) .... Take As Needed 11)  Gabapentin 100 Mg Caps (Gabapentin) .Marland Kitchen.. 1 By Mouth Three Times A Day (Or As Directed) 12)  Meclizine Hcl 12.5 Mg Tabs (Meclizine Hcl) .Marland Kitchen.. 1 By Mouth Every 4 Hours As Needed  For Dizzy Symptoms  Allergies (verified): 1)  ! Levaquin 2)  ! Pcn 3)  ! Cephalexin 4)  ! Macrodantin 5)  ! Prevacid 6)  ! Vioxx 7)  ! * Codiene 8)  ! * Statins  Past History:  Past Medical History: PREMATURE VENTRICULAR CONTRACTIONS  B12 DEFICIENCY  CARCINOMA IN SITU, BREAST R 3/08, L 3/10 ESOPHAGEAL STRICTURE, hx of DIVERTICULOSIS, COLON  HYPOTHYROID, hx of HYPERTENSION HYPERLIPIDEMIA -  intol of all  statins ANEMIA-NOS    MD roster: onc - [facility] - D brodie card -crenshaw gyn -henley uro -peterson gen surg - newman ent - newman  Review of Systems  The patient denies fever, vision loss, hoarseness, syncope, abdominal pain, suspicious skin lesions, transient blindness, and enlarged lymph nodes.    Physical Exam  General:  alert, well-developed, well-nourished, and cooperative to examination.   dtr at side Eyes:  vision grossly intact; pupils equal, round and reactive to light.  conjunctiva and lids normal.   no nystgmus at rest Ears:  normal pinnae bilaterally, without erythema, or swelling;  tenderness to palpation of left tragus. R TM clear, without effusion, or cerumen impaction. L TM with redness, no hazy or effusion evident; Hearing grossly normal bilaterally - Mouth:  teeth and gums in good repair; mucous membranes moist, without lesions or ulcers. oropharynx clear without exudate, no erythema.  Lungs:  normal respiratory effort, no intercostal retractions or use of accessory muscles; normal breath sounds bilaterally - no crackles and no wheezes.    Heart:  normal rate, regular rhythm, no murmur, and no rub. BLE without edema.  Neurologic:  alert & oriented X3 and cranial nerves II-XII symetrically intact.  strength normal in all extremities, sensation intact to light touch, and gait cautious due to vertigo (no nystagmus) speech fluent without dysarthria or aphasia; follows commands with good comprehension.    Impression & Recommendations:  Problem # 1:  DIZZINESS (ICD-780.4) in setting of progressive ear pain - now a/w nausea and vertigo (prev just left ear pain) s/p zpack - no obvious infx souce - no relief of pain or dizziness w/ gabapentin, meclizine tx w/ pred pack for eustatian dysfx (hold 2nd course abx given nontox status and intol of various abx) check head ct -  recent ENT eval for same - necck ct 11/11/09 reviewed - no abn-  refer to PT for vestib rehab given  progressive dizzy symptoms  reviewed w/ pt and dtr who agree to same consider re-eval by ENT (newman) if still not improved The following medications were removed from the medication list:    Meclizine Hcl 12.5 Mg Tabs (Meclizine hcl) .Marland Kitchen... 1 by mouth every 4 hours as needed  for dizzy symptoms  Orders: Prescription Created Electronically [phone]) Physical Therapy Referral (PT) Misc. Referral (Misc. Ref)  Problem # 2:  HYPERTENSION, ESSENTIAL NOS (ICD-401.9)  Her updated medication list for this problem includes:    Toprol Xl 50 Mg Tb24 (Metoprolol succinate) .Marland Kitchen... 1 tab by mouth once daily  BP today: 140/72 Prior BP: 120/72 (12/10/2009)  Prior 10 Yr Risk Heart Disease: Not enough information (06/08/2007)  Labs Reviewed: K+: 3.9 (04/17/2007) Creat: : 1.0 (04/17/2007)   Chol: 198 (  06/11/2009)   HDL: 53.10 (06/11/2009)   LDL: 117 (06/11/2009)   TG: 142.0 (06/11/2009)  Problem # 3:  HYPERLIPIDEMIA (ICD-272.2)  intol of all statins - cont diet and exercise efforts to control same Labs Reviewed: SGOT: 26 (06/02/2006)   SGPT: 23 (06/02/2006)  Lipid Goals: Chol Goal: 200 (01/02/2007)   HDL Goal: 40 (01/02/2007)   LDL Goal: 130 (06/08/2007)   TG Goal: 150 (01/02/2007)  Prior 10 Yr Risk Heart Disease: Not enough information (06/08/2007)   HDL:53.10 (06/11/2009), 48.0 (06/01/2007)  LDL:117 (06/11/2009), 97 (16/01/9603)  Chol:198 (06/11/2009), 182 (06/01/2007)  Trig:142.0 (06/11/2009), 183 (06/01/2007)  Problem # 4:  DIABETES MELLITUS, TYPE II (ICD-250.00) hx same - diet controlled Labs Reviewed: Creat: 1.0 (04/17/2007)    Reviewed HgBA1c results: 5.6 (01/02/2007)  Complete Medication List: 1)  Toprol Xl 50 Mg Tb24 (Metoprolol succinate) .Marland Kitchen.. 1 tab by mouth once daily 2)  Sm Vitamin B-12 Tr 1000 Mcg Tbcr (Cyanocobalamin) .Marland Kitchen.. 1063mcg/1cc im qmonth 3)  Prilosec 20 Mg Cpdr (Omeprazole) .... Take 1 tablet by mouth once a day 4)  Flecainide Acetate 50 Mg Tabs (Flecainide acetate) .Marland Kitchen..  1 tab by mouth two times a day 5)  Anastrozole 1 Mg Tabs (Anastrozole) .Marland Kitchen.. 1 tab by mouth once daily 6)  Analpram-hc 1-2.5 % Crea (Hydrocortisone ace-pramoxine) .... Apply to affected area of rectum three times a day as needed 7)  Clobetasol Propionate 0.05 % Crea (Clobetasol propionate) .... Use on rash as needed 8)  Anusol-hc 25 Mg Supp (Hydrocortisone acetate) .... Insert 1 suppository into rectum at bedtime 9)  Analpram-hc Singles 1-2.5 % Crea (Hydrocortisone ace-pramoxine) .... Apply to rectum 2-3 times per day analpram cream 2.5% ,15gm , apply two times a day 10)  Aleve 220 Mg Tabs (Naproxen sodium) .... Take as needed 11)  Prednisone (pak) 5 Mg Tabs (Prednisone) .... As directed x 6 days  Patient Instructions: 1)  it was good to see you today. 2)  prior ct neck 12/11/09 and ct sinus 05/15/08 reviewed today 3)  stop gabapentin and meclizine 4)  we'll make referral to physcial therapy vestibular program and for head ct scan. Our office will contact you regarding these appointments once made.  5)  use pred pak for inflammation as discussed - your prescription has been electronically submitted to your pharmacy. Please take as directed. Contact our office if you believe you're having problems with the medication(s).  6)  will call once head ct results reviewed 7)  if your symptoms continue to worsen (pain, fever, etc), or if you are unable take anything by mouth (pills, fluids, etc), you should go to the emergency room for further evaluation and treatment.  Prescriptions: PREDNISONE (PAK) 5 MG TABS (PREDNISONE) as directed x 6 days  #1 x 0   Entered and Authorized by:   Newt Lukes MD   Signed by:   Newt Lukes MD on 12/22/2009   Method used:   Electronically to        [facility] (retail)       [address]       [place], [place]  [address]       Ph: [phone]       Fax: [phone]   RxID:   [id number]

## 2010-05-07 NOTE — Assessment & Plan Note (Signed)
Summary: RECTAL PAIN, POSITIVE HEMOCCULTS       (DR.BRODIE PT.)    DEB...   History of Present Illness Visit Type: Follow-up Visit Primary GI MD: [name] MD Primary Provider: Newt Lukes MD Requesting Provider: n/a Chief Complaint: Rectal pain and positive hemoccults History of Present Illness:   Patient followed by Dr. Juanda Chance for history of diverticulosis, esophageal stricture, constipation and family history of colon cancer and intermittent rectal pain. Last seen Jan. 2009. She is here today for recurrent rectal pain, started two weeks ago. Pain is dull, predominantly left sided. It is constant during the day, interestinly it is not related to defecation.  Saw GYN two weeks ago, told heme positive but patient tells me she was having problems with hemorrhoids at the time.   Patient has chronic constipation and takes Miralax when needed. Had been doing okay without Miralax until a few months ago. She currently takes Miralax 2-3 times a week. Gets daily urge to defecate but doesn't adequately empty her bowels.  Stools volume is small.  Feels like "something is in the way". She has seen a small amount of blood when wiping.  Left breast cancer in 2008 and then right breast in 2010.  On oral chemo.   GI Review of Systems      Denies abdominal pain, acid reflux, belching, bloating, chest pain, dysphagia with liquids, dysphagia with solids, heartburn, loss of appetite, nausea, vomiting, vomiting blood, weight loss, and  weight gain.      Reports hemorrhoids, rectal bleeding, and  rectal pain.     Denies anal fissure, black tarry stools, change in bowel habit, constipation, diarrhea, diverticulosis, fecal incontinence, heme positive stool, irritable bowel syndrome, jaundice, light color stool, and  liver problems.    Current Medications (verified): 1)  Toprol Xl 50 Mg  Tb24 (Metoprolol Succinate) .Marland Kitchen.. 1 Tab By Mouth Once Daily 2)  Sm Vitamin B-12 Tr 1000 Mcg  Tbcr (Cyanocobalamin)  .Marland Kitchen.. 1047mcg/1cc Im Qmonth 3)  Prilosec 20 Mg  Cpdr (Omeprazole) .... Take 1 Tablet By Mouth Once A Day 4)  Flecainide Acetate 50 Mg Tabs (Flecainide Acetate) .Marland Kitchen.. 1 Tab By Mouth Two Times A Day 5)  Anastrozole 1 Mg Tabs (Anastrozole) .Marland Kitchen.. 1 Tab By Mouth Once Daily 6)  Analpram-Hc 1-2.5 % Crea (Hydrocortisone Ace-Pramoxine) .... Apply To Affected Area of Rectum Two Times A Day As Needed 7)  Clobetasol Propionate 0.05 % Crea (Clobetasol Propionate) .... Use On Rash As Needed  Allergies (verified): 1)  ! Levaquin 2)  ! Pcn 3)  ! Cephalexin 4)  ! Macrodantin 5)  ! Prevacid 6)  ! Vioxx 7)  ! * Codiene 8)  ! * Statins  Past History:  Past Medical History: Last updated: 06/10/2009 PREMATURE VENTRICULAR CONTRACTIONS  B12 DEFICIENCY (ICD-266.2) HYPERLIPIDEMIA (ICD-272.2) CARCINOMA IN SITU, BREAST (ICD-233.0) HEMORRHOIDS, INTERNAL (ICD-455.0) ESOPHAGEAL STRICTURE (ICD-530.3) DIVERTICULOSIS, COLON (ICD-562.10) OTHER ALOPECIA (ICD-704.09) HYPOTHYROIDISM (ICD-244.9) HYPERTENSION, ESSENTIAL NOS (ICD-401.9) HYPERLIPIDEMIA (ICD-272.2) ANEMIA-NOS (ICD-285.9)  MD rooster: onc - [facility] - D brodie card -crenshaw gyn -henley uro -peterson gen surg - newman  Past Surgical History: Last updated: 02/24/2009 Cath neg 2000; esophageal stricture 2006 Colon polypectomy Lumpectomy X2 Hysterectomy - 1979 Appendectomy - 2002 Tonsillectomy - 1942 Gall Bladder - 2002  Family History: Last updated: 06/08/2007 family history coronary artery bypass graft family history DVT family history neoplasm, colon father suicide sister breast cancer has three sisters with breast cancer mother colon cancer sister diverticulitis maternal uncle CABG  Social History: Last updated:  06/10/2009 Never Smoked Alcohol use-no Retired [employer] operational officer Married, lives with spouse Regular Exercise - yes Drug Use - no  Review of Systems       The patient complains of arthritis/joint pain and shortness of  breath.  The patient denies allergy/sinus, anemia, anxiety-new, blood in urine, breast changes/lumps, change in vision, confusion, cough, coughing up blood, depression-new, fainting, fatigue, fever, headaches-new, hearing problems, heart murmur, heart rhythm changes, itching, menstrual pain, muscle pains/cramps, night sweats, nosebleeds, pregnancy symptoms, skin rash, sleeping problems, sore throat, swelling of feet/legs, swollen lymph glands, thirst - excessive , urination - excessive , urination changes/pain, urine leakage, vision changes, and voice change.    Vital Signs:  Patient profile:   77 year old female Height:      63 inches Weight:      160 pounds BMI:     28.45 BSA:     1.76 Pulse rate:   62 / minute Pulse rhythm:   regular BP sitting:   140 / 68  (left arm)  Vitals Entered By: Merri Ray CMA (AAMA) (July 03, 2009 10:20 AM)  Physical Exam  General:  Well developed, well nourished, no acute distress. Head:  Normocephalic and atraumatic. Eyes:  Conjunctiva pink, no icterus.  Mouth:  No oral lesions. Tongue moist.  Neck:  no obvious masses  Lungs:  Clear throughout to auscultation. Heart:  Regular rate and rhythm; no murmurs, rubs,  or bruits. Abdomen:  Abdomen soft, nontender, nondistended. No obvious masses or hepatomegaly.Normal bowel sounds.  Rectal:  No hemorrhoids seen. No external or internal masses appreciated. Small amount light brown, heme negative stool Extremities:  No palmar erythema, no edema.  Neurologic:  Alert and  oriented x4;  grossly normal neurologically. Skin:  Intact without significant lesions or rashes. Cervical Nodes:  No significant cervical adenopathy. Psych:  Alert and cooperative. Normal mood and affect.   Impression & Recommendations:  Problem # 1:  RECTAL PAIN (ZOX-096.04) Assessment Comment Only Rectal pain and has seen small amount of blood on tissue. Tender just inside anus (left side). Nothing obvious on exam. Maybe there  is a small fissure there that I was unable to visualize.  Will treat constipation.  Needs to use Analpram intrarectally (sounds like she wasn't ). Bleeding likely perianal. Will check CBC though suspect it will be fine. See #2. Follow up with Dr. Juanda Chance in a few weeks. If pain doesn't resolve, consider flexible sigmoidoscopy. Orders: TLB-CBC Platelet - w/Differential ([id number])  Problem # 2:  CONSTIPATION (ICD-564.00) History of functional constipation. She complains of incomplete bowel evacuation. Digital rectal exam negative. She had a normal colonoscopy to cecum with good prep in 2009. Recently stopped Metamucil. Advised resumption of fiber, she will try Benefiber.  Increase Miralax to once daily.  Problem # 3:  CARCINOMA IN SITU, BREAST (ICD-233.0) Assessment: Comment Only Left Breast in 2008 and right breast in 2010.   Problem # 4:  FAMILY HX COLON CANCER (ICD-V16.0) Assessment: Comment Only Up to date on screening. Next exam per Dr. Juanda Chance.   Patient Instructions: 1)  Your physician has requested that you have the following labwork done today: Please go to basement level lab. 2)  Start daily Benefber 3)  Increase Miralax to once daily.  4)  Analpram with applicator  once a ngith for 7-10 days.  5)  Irritable Bowel Syndrome brochure. 6)  Copy sent to : Otho Najjar, MD 7)  The medication list was reviewed and reconciled.  All changed / newly prescribed medications were  explained.  A complete medication list was provided to the patient / caregiver.

## 2010-05-07 NOTE — Assessment & Plan Note (Signed)
Summary: MONTHLY B12 SHOT...LSW.  Nurse Visit   Allergies: 1)  ! Levaquin 2)  ! Pcn 3)  ! Cephalexin 4)  ! Macrodantin 5)  ! Prevacid 6)  ! Vioxx 7)  ! * Codiene 8)  ! * Statins  Medication Administration  Injection # 1:    Medication: Vit B12 1000 mcg    Diagnosis: ANEMIA-NOS (ICD-285.9)    Route: IM    Site: R deltoid    Exp Date: 11/2010    Lot #: [id number]    Mfr: [employer]    Comments: pt to schedule next monthly B12 at front desk    Patient tolerated injection without complications    Given by: Chales Abrahams CMA Duncan Dull) (June 10, 2009 9:14 AM)  Orders Added: 1)  Vit B12 1000 mcg [J3420]

## 2010-05-07 NOTE — Assessment & Plan Note (Signed)
Summary: b12 injection  Nurse Visit   Allergies: 1)  ! Levaquin 2)  ! Pcn 3)  ! Cephalexin 4)  ! Macrodantin 5)  ! Prevacid 6)  ! Vioxx 7)  ! * Codiene 8)  ! * Statins  Medication Administration  Injection # 1:    Medication: Vit B12 1000 mcg    Diagnosis: B12 DEFICIENCY (ICD-266.2)    Route: IM    Site: L deltoid    Exp Date: 10/2011    Lot #: [id number]    Mfr: [employer]    Comments: pt to schedule next monthly b 12 at front desk    Patient tolerated injection without complications    Given by: Chales Abrahams CMA Duncan Dull) (January 15, 2010 9:53 AM)  Orders Added: 1)  Vit B12 1000 mcg [J3420]

## 2010-05-07 NOTE — Miscellaneous (Signed)
Summary: Prilosec Rx  Prescription faxed to [facility] per her request @ fax [phone]. Dottie Nelson-Smith CMA (AAMA)  December 12, 2009 11:24 AM   Clinical Lists Changes  Medications: Changed medication from PRILOSEC 20 MG  CPDR (OMEPRAZOLE) Take 1 tablet by mouth once a day to PRILOSEC 20 MG  CPDR (OMEPRAZOLE) Take 1 tablet by mouth once a day - Signed Rx of PRILOSEC 20 MG  CPDR (OMEPRAZOLE) Take 1 tablet by mouth once a day;  #90 x 2;  Signed;  Entered by: Lamona Curl CMA (AAMA);  Authorized by: Hart Carwin MD;  Method used: Printed then faxed to [facility] [address] [place], [place], [place]  [address], Ph: [phone], Fax: [phone]    Prescriptions: PRILOSEC 20 MG  CPDR (OMEPRAZOLE) Take 1 tablet by mouth once a day  #90 x 2   Entered by:   Lamona Curl CMA (AAMA)   Authorized by:   Hart Carwin MD   Signed by:   Lamona Curl CMA (AAMA) on 12/12/2009   Method used:   Printed then faxed to ...       [facility]       [address]       [place], [place]  [address]       Ph: [phone]       Fax: [phone]   RxID:   [id number]

## 2010-05-07 NOTE — Letter (Signed)
Summary: [facility]   Imported By: Lanelle Bal 09/11/2009 13:11:22  _____________________________________________________________________  External Attachment:    Type:   Image     Comment:   External Document

## 2010-05-07 NOTE — Miscellaneous (Signed)
Summary: Prilosec refill  Clinical Lists Changes  Medications: Changed medication from PRILOSEC 20 MG  CPDR (OMEPRAZOLE) Take 1 tablet by mouth once a day to PRILOSEC 20 MG  CPDR (OMEPRAZOLE) Take 1 tablet by mouth once a day - Signed Rx of PRILOSEC 20 MG  CPDR (OMEPRAZOLE) Take 1 tablet by mouth once a day;  #90 x 3;  Signed;  Entered by: Christie Nottingham CMA (AAMA);  Authorized by: Hart Carwin MD;  Method used: Printed then faxed to [facility] [address], [place], [place], [place]  [address], Ph: [phone], Fax: [phone]    Prescriptions: PRILOSEC 20 MG  CPDR (OMEPRAZOLE) Take 1 tablet by mouth once a day  #90 x 3   Entered by:   Christie Nottingham CMA (AAMA)   Authorized by:   Hart Carwin MD   Signed by:   Christie Nottingham CMA (AAMA) on 09/09/2009   Method used:   Printed then faxed to ...       [facility]       [address]       [place], [place]  [address]       Ph: [phone]       Fax: [phone]   RxID:   [id number]   Appended Document: Prilosec refill d/c'ed this prescription at [facility] today as patient would rather have prescription from [facility] mail in pharmacy.

## 2010-05-07 NOTE — Assessment & Plan Note (Signed)
Summary: flu /kdc   Vital Signs:  Patient profile:   77 year old female Height:      63 inches Weight:      159 pounds O2 Sat:      98 % on Room air Temp:     98.2 degrees F oral Pulse rate:   72 / minute Resp:     17 per minute BP sitting:   150 / 90  (left arm)  Vitals Entered By: Jeremy Johann CMA (April 07, 2009 12:41 PM)  O2 Flow:  Room air CC: cough, chest congestion x5d, URI symptoms Comments REVIEWED MED LIST, PATIENT AGREED DOSE AND INSTRUCTION CORRECT    CC:  cough, chest congestion x5d, and URI symptoms.  History of Present Illness: See BP ; she has been taking decongestants for present illness. BP usually 120/60+  @ home. Onset 12/29/201 as URI .  The patient reports nasal congestion, clear nasal discharge, productive cough (swallowed), and sick contact( husband), but denies sore throat and earache.  Associated symptoms include wheezing.  The patient denies fever, stiff neck, dyspnea, rash, vomiting, and diarrhea.  The patient also reports itchy watery eyes, sneezing, and headache.  The patient denies muscle aches and severe fatigue.  The patient denies the following risk factors for Strep sinusitis: unilateral facial pain, unilateral nasal discharge, tooth pain, Strep exposure, and tender adenopathy.    Allergies: 1)  ! Levaquin 2)  ! Pcn 3)  ! Cephalexin 4)  ! Macrodantin 5)  ! Prevacid 6)  ! Vioxx 7)  ! * Codiene 8)  ! * Statins  Physical Exam  General:  well-nourished,in no acute distress; alert,appropriate and cooperative throughout examination Ears:  External ear exam shows no significant lesions or deformities.  Otoscopic examination reveals clear canals, tympanic membranes are intact bilaterally without bulging, retraction, inflammation or discharge. Hearing is grossly normal bilaterally. Some wax bilaterally Nose:  External nasal examination shows no deformity or inflammation. Nasal mucosa are pink and moist without lesions or exudates. Mouth:  Oral  mucosa and oropharynx without lesions or exudates.  Teeth in good repair.Minimal hoarseness Lungs:  Normal respiratory effort, chest expands symmetrically. Lungs are clear to auscultation, no  wheezes. Cervical Nodes:  No lymphadenopathy noted Axillary Nodes:  No palpable lymphadenopathy   Impression & Recommendations:  Problem # 1:  URI (ICD-465.9)  Her updated medication list for this problem includes:    Benzonatate 100 Mg Caps (Benzonatate) .Marland Kitchen... 1 q 8 hrs as needed cough  Problem # 2:  BRONCHITIS-ACUTE (ICD-466.0)  Her updated medication list for this problem includes:    Smz-tmp Ds 800-160 Mg Tabs (Sulfamethoxazole-trimethoprim) .Marland Kitchen... 1 two times a day woth 8 0z water    Benzonatate 100 Mg Caps (Benzonatate) .Marland Kitchen... 1 q 8 hrs as needed cough  Complete Medication List: 1)  Toprol Xl 50 Mg Tb24 (Metoprolol succinate) .Marland Kitchen.. 1 tab by mouth once daily 2)  Sm Vitamin B-12 Tr 1000 Mcg Tbcr (Cyanocobalamin) .Marland Kitchen.. 1065mcg/1cc im qmonth 3)  Prilosec 20 Mg Cpdr (Omeprazole) .... Take 1 tablet by mouth once a day 4)  Flecainide Acetate 50 Mg Tabs (Flecainide acetate) .Marland Kitchen.. 1 tab by mouth two times a day 5)  Anastrozole 1 Mg Tabs (Anastrozole) .Marland Kitchen.. 1 tab by mouth once daily 6)  Analpram-hc 1-2.5 % Crea (Hydrocortisone ace-pramoxine) .... Apply to affected area of rectum two times a day as needed 7)  Smz-tmp Ds 800-160 Mg Tabs (Sulfamethoxazole-trimethoprim) .Marland Kitchen.. 1 two times a day woth 8 0z water 8)  Benzonatate 100 Mg Caps (Benzonatate) .Marland Kitchen.. 1 q 8 hrs as needed cough  Patient Instructions: 1)  Drink as much fluid as you can tolerate for the next few days. Neti Rinse or pot once daily as needed followed by Nasonex  until sinuses clear  Prescriptions: BENZONATATE 100 MG CAPS (BENZONATATE) 1 q 8 hrs as needed cough  #15 x 0   Entered and Authorized by:   Marga Melnick MD   Signed by:   Marga Melnick MD on 04/07/2009   Method used:   Faxed to ...       [facility]       [address]       [place], [place]  [address]       Ph: [phone]       Fax: [phone]   RxID:   [id number] SMZ-TMP DS 800-160 MG TABS (SULFAMETHOXAZOLE-TRIMETHOPRIM) 1 two times a day woth 8 0z water  #14 x 0   Entered and Authorized by:   Marga Melnick MD   Signed by:   Marga Melnick MD on 04/07/2009   Method used:   Faxed to ...       [facility]       [address]       [place], [place]  [address]       Ph: [phone]       Fax: [phone]   RxID:   [id number]

## 2010-05-07 NOTE — Progress Notes (Signed)
  Phone Note Call from Patient Call back at Home Phone [phone]   Caller: Daughter Summary of Call: Pt's daughter Jacki Cones called stating that pt is experiencing severe dizziness and nausea and is unable to get out of bed.  Pt is asking for MD's advisement Initial call taken by: Brenton Grills MA,  December 19, 2009 10:38 AM  Follow-up for Phone Call        can try low dose meclizine -erx done- if not improved with meds or if getting worse (pain, fever, unable take anything by mouth, etc) should go to the emergency room for further evaluation and treatment.  Follow-up by: Newt Lukes MD,  December 19, 2009 12:22 PM  Additional Follow-up for Phone Call Additional follow up Details #1::        informed daughter of MD's advisement and that rx was sent in Additional Follow-up by: Brenton Grills MA,  December 19, 2009 1:29 PM    New/Updated Medications: MECLIZINE HCL 12.5 MG TABS (MECLIZINE HCL) 1 by mouth every 4 hours as needed  for dizzy symptoms Prescriptions: MECLIZINE HCL 12.5 MG TABS (MECLIZINE HCL) 1 by mouth every 4 hours as needed  for dizzy symptoms  #30 x 0   Entered and Authorized by:   Newt Lukes MD   Signed by:   Newt Lukes MD on 12/19/2009   Method used:   Electronically to        [facility]ret[facility])       [address]       [place], [place]  [address]       Ph: [phone]       Fax: [phone]   RxID:   [id number]

## 2010-05-07 NOTE — Letter (Signed)
[facility]   Imported By: Roderic Ovens 11/14/2009 11:27:33  _____________________________________________________________________  External Attachment:    Type:   Image     Comment:   External Document

## 2010-05-07 NOTE — Letter (Signed)
[facility]   Imported By: Lanelle Bal 05/01/2009 16:12:26  _____________________________________________________________________  External Attachment:    Type:   Image     Comment:   External Document

## 2010-05-07 NOTE — Assessment & Plan Note (Signed)
Summary: MONTHLY B12 SHOT...LSW.  Nurse Visit   Medication Administration  Injection # 1:    Medication: Vit B12 1000 mcg    Diagnosis: B12 DEFICIENCY (ICD-266.2)    Route: IM    Site: R deltoid    Exp Date: 11/2011    Lot #: [id number]    Mfr: [employer]    Comments: PT WILL RETRUN ON 12/9 FOR NXT INJECTION    Patient tolerated injection without complications    Given by: Francee Piccolo CMA Duncan Dull) (February 13, 2010 10:48 AM)  Orders Added: 1)  Vit B12 1000 mcg [J3420]

## 2010-05-07 NOTE — Assessment & Plan Note (Signed)
Summary: MONTHLY B12 SHOT...LSW.  Nurse Visit   Allergies: 1)  ! Levaquin 2)  ! Pcn 3)  ! Cephalexin 4)  ! Macrodantin 5)  ! Prevacid 6)  ! Vioxx 7)  ! * Codiene 8)  ! * Statins  Medication Administration  Injection # 1:    Medication: Vit B12 1000 mcg    Diagnosis: B12 DEFICIENCY (ICD-266.2)    Route: IM    Site: R deltoid    Exp Date: 07/05/2011    Lot #: [id number]    Comments: [employer]     Patient tolerated injection without complications    Given by: Christie Nottingham CMA (AAMA) (September 09, 2009 9:58 AM)  Orders Added: 1)  Vit B12 1000 mcg [J3420]

## 2010-05-07 NOTE — Letter (Signed)
Summary: [facility]  [facility]   Imported By: Sherian Rein 08/11/2009 09:50:34  _____________________________________________________________________  External Attachment:    Type:   Image     Comment:   External Document

## 2010-05-07 NOTE — Letter (Signed)
Summary: Daiva Huge MD  Daiva Huge MD   Imported By: Lester [place] 01/07/2010 09:20:56  _____________________________________________________________________  External Attachment:    Type:   Image     Comment:   External Document

## 2010-05-07 NOTE — Assessment & Plan Note (Signed)
Summary: 6 MTH FU STC   Vital Signs:  Patient profile:   77 year old female Height:      63 inches (160.02 cm) Weight:      154.12 pounds (70.05 kg) O2 Sat:      98 % on Room air Temp:     97.6 degrees F (36.44 degrees C) oral Pulse rate:   70 / minute BP sitting:   120 / 72  (left arm) Cuff size:   regular  Vitals Entered By: Orlan Leavens RMA (December 10, 2009 10:22 AM)  O2 Flow:  Room air CC: 6 month follow-up Is Patient Diabetic? No Pain Assessment Patient in pain? yes     Location: (L) ear Type: sharp Comments Pt states she saw ENT yesterday was rx z-pack, bu t ear is in very painful x's 5 days   Primary Care Provider:  Newt Lukes MD  CC:  6 month follow-up.  History of Present Illness: here for f/u  1) breast cancer - initial dx 3/08 on left - then right side 3/10 - tol armidex following lumpectomy - s/p XRT - follows with onc regularly - mammo at breast center -  2) HTN - reports compliance with ongoing medical treatment and no changes in medication dose or frequency. denies adverse side effects related to current therapy. symptoms of PVCs managed with flecanide  3) GERD with B12 defic - follows closely with GI - regular shots for B12 replacment and takes PPI once daily - reports compliance with ongoing medical treatment and no changes in medication dose or frequency. denies adverse side effects related to current therapy.   4) hx hypothyroid - on synthroid > 37yr but none in past 6-23yr - denies sking or weight changes , no depression or mood changes- normal TSH 06/2009  5) dyslipidemia - intol of all statins - no specific med tx at this time - tries to monitor with diet and exercise, esp with spouse's DM diet   c/o new head pain - located behind left ear - also now involves the left ear onset 5 days ago - course is progressively worse - pain is described at shooting burning stabs of pain - nearly constant now pain is severe 9/10 - not improved with  aleve saw ENT yest for same - told ear ok but given zpack for throat..? (per pt) no hearing change - no central HA or vision change - +hx similar pain when had shingles in same location in 1960s-- no fever or rash or blisters -  Clinical Review Panels:  Lipid Management   Cholesterol:  198 (06/11/2009)   LDL (bad choesterol):  117 (06/11/2009)   HDL (good cholesterol):  53.10 (06/11/2009)  Diabetes Management   HgBA1C:  5.6 (01/02/2007)   Creatinine:  1.0 (04/17/2007)   Last Flu Vaccine:  Historical (01/03/2009)   Last Pneumovax:  Pneumovax (02/16/2002)  CBC   WBC:  5.5 (07/03/2009)   RBC:  4.04 (07/03/2009)   Hgb:  13.1 (07/03/2009)   Hct:  38.9 (07/03/2009)   Platelets:  165.0 (07/03/2009)   MCV  96.4 (07/03/2009)   MCHC  33.5 (07/03/2009)   RDW  13.6 (07/03/2009)   PMN:  56.1 (07/03/2009)   Lymphs:  30.9 (07/03/2009)   Monos:  10.1 (07/03/2009)   Eosinophils:  2.1 (07/03/2009)   Basophil:  0.8 (07/03/2009)   Current Medications (verified): 1)  Toprol Xl 50 Mg  Tb24 (Metoprolol Succinate) .Marland Kitchen.. 1 Tab By Mouth Once Daily 2)  Sm  Vitamin B-12 Tr 1000 Mcg  Tbcr (Cyanocobalamin) .Marland Kitchen.. 1044mcg/1cc Im Qmonth 3)  Prilosec 20 Mg  Cpdr (Omeprazole) .... Take 1 Tablet By Mouth Once A Day 4)  Flecainide Acetate 50 Mg Tabs (Flecainide Acetate) .Marland Kitchen.. 1 Tab By Mouth Two Times A Day 5)  Anastrozole 1 Mg Tabs (Anastrozole) .Marland Kitchen.. 1 Tab By Mouth Once Daily 6)  Analpram-Hc 1-2.5 % Crea (Hydrocortisone Ace-Pramoxine) .... Apply To Affected Area of Rectum Three Times A Day As Needed 7)  Clobetasol Propionate 0.05 % Crea (Clobetasol Propionate) .... Use On Rash As Needed 8)  Anusol-Hc 25 Mg Supp (Hydrocortisone Acetate) .... Insert 1 Suppository Into Rectum At Bedtime 9)  Analpram-Hc Singles 1-2.5 %  Crea (Hydrocortisone Ace-Pramoxine) .... Apply To Rectum 2-3 Times Per Day Analpram Cream 2.5% ,15gm , Apply Two Times A Day 10)  Azithromycin 250 Mg Tabs (Azithromycin) .... Take 1 By Mouth Once  Daily For 5 Days 11)  Aleve 220 Mg Tabs (Naproxen Sodium) .... Take As Needed  Allergies (verified): 1)  ! Levaquin 2)  ! Pcn 3)  ! Cephalexin 4)  ! Macrodantin 5)  ! Prevacid 6)  ! Vioxx 7)  ! * Codiene 8)  ! * Statins  Past History:  Past Medical History: PREMATURE VENTRICULAR CONTRACTIONS  B12 DEFICIENCY  CARCINOMA IN SITU, BREAST R 3/08, L 3/10 ESOPHAGEAL STRICTURE, hx of DIVERTICULOSIS, COLON  HYPOTHYROID, hx of HYPERTENSION HYPERLIPIDEMIA -  intol of all statins ANEMIA-NOS   MD roster: onc - [facility] - D brodie card -crenshaw gyn -henley uro -peterson gen surg - newman ent - newman  Review of Systems  The patient denies fever, weight loss, chest pain, peripheral edema, and abdominal pain.    Physical Exam  General:  alert, well-developed, well-nourished, and cooperative to examination.    Ears:  normal pinnae bilaterally, without erythema, swelling, or tenderness to palpation. TMs clear, without effusion, or cerumen impaction. Hearing grossly normal bilaterally - Mouth:  teeth and gums in good repair; mucous membranes moist, without lesions or ulcers. oropharynx clear without exudate, no erythema.  Lungs:  normal respiratory effort, no intercostal retractions or use of accessory muscles; normal breath sounds bilaterally - no crackles and no wheezes.    Heart:  normal rate, regular rhythm, no murmur, and no rub. BLE without edema.  Neurologic:  alert & oriented X3 and cranial nerves II-XII symetrically intact.  strength normal in all extremities, sensation intact to light touch, and gait normal. speech fluent without dysarthria or aphasia; follows commands with good comprehension.  Skin:  no vesicles or rash along site of pain behind left ear or involving pinnae Psych:  Oriented X3, memory intact for recent and remote, normally interactive, good eye contact, not anxious appearing, not depressed appearing, and not agitated.      Impression &  Recommendations:  Problem # 1:  POSTHERPETIC NEURALGIA (ICD-053.19)  despite lack of typical rash, pt pain syndrome very c/w herpetic neuralgia - esp with hx same (remote) start neurontin for neuropathic pain and watch for vesicles - will add valtrex if any appear - ok to use aleve if desired and cont zpack as per ENT - no neuro deficits on exam   Orders: Prescription Created Electronically [phone])  Problem # 2:  HYPERTENSION, ESSENTIAL NOS (ICD-401.9)  Her updated medication list for this problem includes:    Toprol Xl 50 Mg Tb24 (Metoprolol succinate) .Marland Kitchen... 1 tab by mouth once daily  BP today: 120/72 Prior BP: 110/58 (10/28/2009)  Prior 10 Yr Risk  Heart Disease: Not enough information (06/08/2007)  Labs Reviewed: K+: 3.9 (04/17/2007) Creat: : 1.0 (04/17/2007)   Chol: 198 (06/11/2009)   HDL: 53.10 (06/11/2009)   LDL: 117 (06/11/2009)   TG: 142.0 (06/11/2009)  Problem # 3:  HYPERLIPIDEMIA (ICD-272.2) intol of all statins - cont diet and exercise efforts to control same Labs Reviewed: SGOT: 26 (06/02/2006)   SGPT: 23 (06/02/2006)  Lipid Goals: Chol Goal: 200 (01/02/2007)   HDL Goal: 40 (01/02/2007)   LDL Goal: 130 (06/08/2007)   TG Goal: 150 (01/02/2007)  Prior 10 Yr Risk Heart Disease: Not enough information (06/08/2007)   HDL:53.10 (06/11/2009), 48.0 (06/01/2007)  LDL:117 (06/11/2009), 97 (29/56/2130)  Chol:198 (06/11/2009), 182 (06/01/2007)  Trig:142.0 (06/11/2009), 183 (06/01/2007)  Problem # 4:  CARCINOMA IN SITU, BREAST (ICD-233.0)  Left Breast in 06/2006 and right breast in 06/2008.  cont to follow with onc as ongoing - cont armidex  Problem # 5:  PREMATURE VENTRICULAR CONTRACTIONS (ICD-427.69)  Her updated medication list for this problem includes:    Toprol Xl 50 Mg Tb24 (Metoprolol succinate) .Marland Kitchen... 1 tab by mouth once daily    Flecainide Acetate 50 Mg Tabs (Flecainide acetate) .Marland Kitchen... 1 tab by mouth two times a day  well controlled symptoms - cont same per  cards  Echocardiogram:  -  Overall left ventricular systolic function was vigorous. Left         ventricular ejection fraction was estimated , range being 70         % to 75 %. There were no left ventricular regional wall         motion abnormalities. Left ventricular wall thickness was         mildly increased. Features were consistent with mild         diastolic dysfunction.   -  There was overall mild pulmonic regurgitation. It is possible         that there are two distinct jets, one very eccentric, the         other central. Could also consider the possibility of a         remnant PDA or prominent flow seen in a coronary. Does not         look significantly different compared to prior study from         2007. (03/14/2007)  Labs Reviewed: Na: 142 (04/17/2007)   K+: 3.9 (04/17/2007)   CL: 106 (04/17/2007)   HCO3: 31 (04/17/2007) Mg: 2.0 (04/17/2007)   Ca: 9.3 (08/15/2008)   TSH: 2.15 (06/01/2007)   HCO3: 31 (04/17/2007)  Complete Medication List: 1)  Toprol Xl 50 Mg Tb24 (Metoprolol succinate) .Marland Kitchen.. 1 tab by mouth once daily 2)  Sm Vitamin B-12 Tr 1000 Mcg Tbcr (Cyanocobalamin) .Marland Kitchen.. 1068mcg/1cc im qmonth 3)  Prilosec 20 Mg Cpdr (Omeprazole) .... Take 1 tablet by mouth once a day 4)  Flecainide Acetate 50 Mg Tabs (Flecainide acetate) .Marland Kitchen.. 1 tab by mouth two times a day 5)  Anastrozole 1 Mg Tabs (Anastrozole) .Marland Kitchen.. 1 tab by mouth once daily 6)  Analpram-hc 1-2.5 % Crea (Hydrocortisone ace-pramoxine) .... Apply to affected area of rectum three times a day as needed 7)  Clobetasol Propionate 0.05 % Crea (Clobetasol propionate) .... Use on rash as needed 8)  Anusol-hc 25 Mg Supp (Hydrocortisone acetate) .... Insert 1 suppository into rectum at bedtime 9)  Analpram-hc Singles 1-2.5 % Crea (Hydrocortisone ace-pramoxine) .... Apply to rectum 2-3 times per day analpram cream 2.5% ,15gm , apply two times a day  10)  Azithromycin 250 Mg Tabs (Azithromycin) .... Take 1 by mouth once daily for 5  days 11)  Aleve 220 Mg Tabs (Naproxen sodium) .... Take as needed 12)  Gabapentin 100 Mg Caps (Gabapentin) .Marland Kitchen.. 1 by mouth three times a day (or as directed)  Patient Instructions: 1)  it was good to see you today. 2)  use gabapentin for the nerve pain and let us know if you see any shingles blisters in this affected area or if different pain medication is needed 3)  your prescription has been electronically submitted to your pharmacy. Please take as directed. Contact our office if you believe you're having problems with the medication(s).  4)  Please schedule a follow-up appointment in 6 months for review, sooner if problems.  will check cholesterol and thyroid at that time so come in fasting Prescriptions: GABAPENTIN 100 MG CAPS (GABAPENTIN) 1 by mouth three times a day (or as directed)  #90 x 1   Entered and Authorized by:   Newt Lukes MD   Signed by:   Newt Lukes MD on 12/10/2009   Method used:   Electronically to        [facility] (ret[facility])       [address]       [place], [place]  [address]       Ph: [phone]       Fax: [phone]   RxID:   [id number]

## 2010-05-07 NOTE — Assessment & Plan Note (Signed)
Summary: MONTHLY B12/266.2//SP  Nurse Visit   Allergies: 1)  ! Levaquin 2)  ! Pcn 3)  ! Cephalexin 4)  ! Macrodantin 5)  ! Prevacid 6)  ! Vioxx 7)  ! * Codiene 8)  ! * Statins  Medication Administration  Injection # 1:    Medication: Vit B12 1000 mcg    Diagnosis: B12 DEFICIENCY (ICD-266.2)    Route: IM    Site: L deltoid    Exp Date: 10/13    Lot #: [id number]    Mfr: [employer]    Patient tolerated injection without complications    Given by: Lamona Curl CMA (AAMA) (March 13, 2010 10:37 AM)  Orders Added: 1)  Vit B12 1000 mcg [J3420]

## 2010-05-07 NOTE — Assessment & Plan Note (Signed)
Summary: MONTHLY B12 SHOT...LSW.  Nurse Visit   Allergies: 1)  ! Levaquin 2)  ! Pcn 3)  ! Cephalexin 4)  ! Macrodantin 5)  ! Prevacid 6)  ! Vioxx 7)  ! * Codiene 8)  ! * Statins  Medication Administration  Injection # 1:    Medication: Vit B12 1000 mcg    Diagnosis: B12 DEFICIENCY (ICD-266.2)    Route: IM    Site: L deltoid    Exp Date: 4/13    Lot #: [id number]    Mfr: [employer]    Patient tolerated injection without complications    Given by: Milford Cage NCMA (October 10, 2009 10:52 AM)  Orders Added: 1)  Vit B12 1000 mcg [J3420]

## 2010-05-07 NOTE — Progress Notes (Signed)
Summary: Triage-Rectal pain  Phone Note Call from Patient Call back at Home Phone [phone]   Caller: Patient Call For: Dr. Juanda Chance Reason for Call: Talk to Nurse Summary of Call: Dr. Ebony Hail office called and informed pt. he found some blood in hemmocult. Would like to discuss. Initial call taken by: Karna Christmas,  July 02, 2009 4:35 PM  Follow-up for Phone Call        Pt. states Dr.Henley wants her to see Dr.Anara Cowman. Pt. also states she has a hemorrhoid, "On my left side of my rectum, on the outside."  Has occ. blood with bm's, increased rectal pain, burning and itching. "It's very painful to the touch, for about a month now" Does feel better today, has been using Analpram cream every day for two years. Wants to be seen ASAP. Pt. will see Willette Cluster NP  on 07-03-09 at 10am. Pt. will have Dr.Henley fax notes over. Follow-up by: Laureen Ochs LPN,  July 02, 2009 4:45 PM

## 2010-05-07 NOTE — Assessment & Plan Note (Signed)
Summary: F1Y/DM   Referring Provider:  n/a Primary Provider:  Newt Lukes MD   History of Present Illness: Tami Jacobson is a pleasant  female who has a history of PACs and PVCs with normal LV function.  She had significant palpitations in the past despite beta-blockade.  She is therefore on flecainide 50 mg p.o. b.i.d. A previous catheterization in June 2000 showed normal coronary arteries and normal LV function. Last Myoview in October of 2004 showed an ejection fraction of 70% with apical thinning and probable normal perfusion. Last echocardiogram in December of 2008 showed normal LV function with an ejection fraction of 70-75%, trivial mitral regurgitation and tricuspid regurgitation. Carotid Dopplers in September of 2007 showed normal carotid arteries.  I last saw her in November of 2010. Since then she has dyspnea with more extreme activities but not with routine activities. It is relieved with rest. It is not associated with chest pain. NO Orthopnea, PND, pedal edema, syncope or exertional chest pain. She occasionally feels her heart "skip" but this is predominantly when she drinks significant amounts of caffeine. They are nonsustained and not particularly bothersome.  Current Medications (verified): 1)  Toprol Xl 50 Mg  Tb24 (Metoprolol Succinate) .Marland Kitchen.. 1 Tab By Mouth Once Daily 2)  Sm Vitamin B-12 Tr 1000 Mcg  Tbcr (Cyanocobalamin) .Marland Kitchen.. 1010mcg/1cc Im Qmonth 3)  Prilosec 20 Mg  Cpdr (Omeprazole) .... Take 1 Tablet By Mouth Once A Day 4)  Flecainide Acetate 50 Mg Tabs (Flecainide Acetate) .Marland Kitchen.. 1 Tab By Mouth Two Times A Day 5)  Anastrozole 1 Mg Tabs (Anastrozole) .Marland Kitchen.. 1 Tab By Mouth Once Daily 6)  Analpram-Hc 1-2.5 % Crea (Hydrocortisone Ace-Pramoxine) .... Apply To Affected Area of Rectum Three Times A Day As Needed  Allergies: 1)  ! Levaquin 2)  ! Pcn 3)  ! Cephalexin 4)  ! Macrodantin 5)  ! Prevacid 6)  ! Vioxx 7)  ! * Codiene 8)  ! * Statins  Past History:  Past Medical  History: Reviewed history from 12/22/2009 and no changes required. 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 Cath neg 2000; esophageal stricture 2006 Colon polypectomy Lumpectomy X2 Hysterectomy - 1979 Appendectomy - 2002 Tonsillectomy - 1942 Gall Bladder - 2002  Social History: Reviewed history from 06/10/2009 and no changes required. 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 Remaining systems are negative.   Vital Signs:  Patient profile:   77 year old female Height:      63 inches Weight:      148 pounds BMI:     26.31 Pulse rate:   69 / minute Resp:     16 per minute BP sitting:   115 / 53  (left arm)  Vitals Entered By: Kem Parkinson (March 02, 2010 9:44 AM)  Physical Exam  General:  Well-developed well-nourished in no acute distress.  Skin is warm and dry.  HEENT is normal.  Neck is supple. No thyromegaly.  Chest is clear to auscultation with normal expansion.  Cardiovascular exam is regular rate and rhythm.  Abdominal exam nontender or distended. No masses palpated. Extremities show no edema. neuro grossly intact    EKG  Procedure date:  03/02/2010  Findings:      Sinus rhythm, first degree AV block, RV conduction delay, cannot rule out prior inferior infarct.  Impression &  Recommendations:  Problem # 1:  HYPERTENSION, ESSENTIAL NOS (ICD-401.9) Blood pressure controlled on present medications. Will continue. Her updated medication list for this problem includes:    Toprol Xl 50 Mg Tb24 (Metoprolol succinate) .Marland Kitchen... 1 tab by mouth once daily  Problem # 2:  DIABETES MELLITUS, TYPE II (ICD-250.00)  Problem # 3:  HYPERLIPIDEMIA (ICD-272.2)  Problem # 4:  PREMATURE VENTRICULAR CONTRACTIONS (ICD-427.69) Continue Toprol and flecainide. Palpitations controlled on these medications. Her updated medication list for this problem includes:    Toprol Xl 50 Mg Tb24 (Metoprolol succinate) .Marland Kitchen... 1 tab by mouth once daily    Flecainide Acetate 50 Mg Tabs (Flecainide acetate) .Marland Kitchen... 1 tab by mouth two times a day  Problem # 5:  HYPOTHYROIDISM (ICD-244.9)

## 2010-05-07 NOTE — Miscellaneous (Signed)
Summary: analpram order  Clinical Lists Changes  Medications: Added new medication of ANALPRAM-HC SINGLES 1-2.5 %  CREA (HYDROCORTISONE ACE-PRAMOXINE) Apply to rectum 2-3 times per day analpram cream 2.5% ,15gm , apply two times a day - Signed Rx of ANALPRAM-HC SINGLES 1-2.5 %  CREA (HYDROCORTISONE ACE-PRAMOXINE) Apply to rectum 2-3 times per day analpram cream 2.5% ,15gm , apply two times a day;  #1 x 2;  Signed;  Entered by: Alease Frame RN;  Authorized by: Hart Carwin MD;  Method used: Electronically to [facility] [address] [place], [place], [place]  [address], Ph: [phone], Fax: [phone] Observations: Added new observation of ALLERGY REV: Done (11/13/2009 15:37)    Prescriptions: ANALPRAM-HC SINGLES 1-2.5 %  CREA (HYDROCORTISONE ACE-PRAMOXINE) Apply to rectum 2-3 times per day analpram cream 2.5% ,15gm , apply two times a day  #1 x 2   Entered by:   Alease Frame RN   Authorized by:   Hart Carwin MD   Signed by:   Alease Frame RN on 11/13/2009   Method used:   Electronically to        [facility] (retail)       [address]       [place], [place]  [address]       Ph: [phone]       Fax: [phone]   RxID:   [id number]

## 2010-05-07 NOTE — Assessment & Plan Note (Signed)
Summary: NEW / MEDICARE/ TRANSFER FROM DR HOPPER/ NWS #   Vital Signs:  Patient profile:   77 year old female Height:      63 inches (160.02 cm) Weight:      162.2 pounds (73.73 kg) O2 Sat:      97 % on Room air Temp:     97.2 degrees F (36.22 degrees C) oral Pulse rate:   66 / minute BP sitting:   128 / 68  (left arm) Cuff size:   regular  Vitals Entered By: Orlan Leavens (June 10, 2009 9:40 AM)  O2 Flow:  Room air CC: New patient Is Patient Diabetic? No Pain Assessment Patient in pain? no        Primary Care Provider:  Newt Lukes MD  CC:  New patient.  History of Present Illness: new pt to me - know to our division as recently followed by colleuge dr. hopper at [facility]  1) breast cancer - initial dx 3/08 on left - then right side 3/10 - tol armidex following lumpectomy - s/p XRT - follows with onc regularly - mammo at breast center -  2) HTN - reports compliance with ongoing medical treatment and no changes in medication dose or frequency. denies adverse side effects related to current therapy. symptoms of PVCs managed with flecanide  3) GERD with B12 defic - follows closely with GI - regular shots for B12 replacment and takes PPI once daily - reports compliance with ongoing medical treatment and no changes in medication dose or frequency. denies adverse side effects related to current therapy.   4) hx hypothyroid - on synthroid > 30yr but none in past 6-60yr - denies sking or weight changes , no depression or mood changes- would like lab check on status of thyroid condition  5) dyslipidemia - intol of all statins - no specific med tx at this time but would like chol lab check - not fasting this AM   Clinical Review Panels:  Prevention   Last Mammogram:  No specific mammographic evidence of malignancy.  Location: [facility].    (06/09/2009)   Last Colonoscopy:  Location:  [facility].  Results: Diverticulosis.    (05/16/2007)  Immunizations   Last Tetanus Booster:  Td (06/10/2009)   Last Flu Vaccine:  Historical (01/03/2009)   Last Pneumovax:  Pneumovax (02/16/2002)  Lipid Management   Cholesterol:  182 (06/01/2007)   LDL (bad choesterol):  97 (06/01/2007)   HDL (good cholesterol):  48.0 (06/01/2007)  Complete Metabolic Panel   Glucose:  118 (04/17/2007)   Sodium:  142 (04/17/2007)   Potassium:  3.9 (04/17/2007)   Chloride:  106 (04/17/2007)   CO2:  31 (04/17/2007)   BUN:  11 (04/17/2007)   Creatinine:  1.0 (04/17/2007)   Albumin:  3.9 (06/02/2006)   Total Protein:  7.0 (06/02/2006)   Calcium:  9.3 (08/15/2008)   Total Bili:  1.0 (06/02/2006)   Alk Phos:  55 (06/02/2006)   SGPT (ALT):  23 (06/02/2006)   SGOT (AST):  26 (06/02/2006)   Current Medications (verified): 1)  Toprol Xl 50 Mg  Tb24 (Metoprolol Succinate) .Marland Kitchen.. 1 Tab By Mouth Once Daily 2)  Sm Vitamin B-12 Tr 1000 Mcg  Tbcr (Cyanocobalamin) .Marland Kitchen.. 1019mcg/1cc Im Qmonth 3)  Prilosec 20 Mg  Cpdr (Omeprazole) .... Take 1 Tablet By Mouth Once A Day 4)  Flecainide Acetate 50 Mg Tabs (Flecainide Acetate) .Marland Kitchen.. 1 Tab By Mouth Two Times A Day 5)  Anastrozole  1 Mg Tabs (Anastrozole) .Marland Kitchen.. 1 Tab By Mouth Once Daily 6)  Analpram-Hc 1-2.5 % Crea (Hydrocortisone Ace-Pramoxine) .... Apply To Affected Area of Rectum Two Times A Day As Needed  Allergies (verified): 1)  ! Levaquin 2)  ! Pcn 3)  ! Cephalexin 4)  ! Macrodantin 5)  ! Prevacid 6)  ! Vioxx 7)  ! * Codiene 8)  ! * Statins  Past History:  Past Medical History: PREMATURE VENTRICULAR CONTRACTIONS  B12 DEFICIENCY (ICD-266.2) HYPERLIPIDEMIA (ICD-272.2) CARCINOMA IN SITU, BREAST (ICD-233.0) HEMORRHOIDS, INTERNAL (ICD-455.0) ESOPHAGEAL STRICTURE (ICD-530.3) DIVERTICULOSIS, COLON (ICD-562.10) OTHER ALOPECIA (ICD-704.09) HYPOTHYROIDISM (ICD-244.9) HYPERTENSION, ESSENTIAL NOS (ICD-401.9) HYPERLIPIDEMIA (ICD-272.2) ANEMIA-NOS (ICD-285.9)  MD rooster: onc - [facility] - D  brodie card -[facility]shaw gyn -[facility] uro -[facility] gen surg - newman  Family History: Reviewed history from 06/08/2007 and no changes required. 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 I have reviewed all other systems and they were negative.   Physical Exam  General:  alert, well-developed, well-nourished, and cooperative to examination.    Head:  Normocephalic and atraumatic without obvious abnormalities. No apparent alopecia or balding. Eyes:  vision grossly intact; pupils equal, round and reactive to light.  conjunctiva and lids normal.    Ears:  normal pinnae bilaterally, without erythema, swelling, or tenderness to palpation. TMs clear, without effusion, or cerumen impaction. Hearing grossly normal bilaterally  Mouth:  teeth and gums in good repair; mucous membranes moist, without lesions or ulcers. oropharynx clear without exudate, no erythema.  Lungs:  normal respiratory effort, no intercostal retractions or use of accessory muscles; normal breath sounds bilaterally - no crackles and no wheezes.    Heart:  normal rate, regular rhythm, no murmur, and no rub. BLE without edema.  Abdomen:  soft, non-tender, normal bowel sounds, no distention; no masses and no appreciable hepatomegaly or splenomegaly.   Genitalia:  defer to gyn Msk:  No deformity or scoliosis noted of thoracic or lumbar spine.   Neurologic:  alert & oriented X3 and cranial nerves II-XII symetrically intact.  strength normal in all extremities, sensation intact to light touch, and gait normal. speech fluent without dysarthria or aphasia; follows commands with  good comprehension.  Skin:  no rashes, vesicles, ulcers, or erythema. No nodules or irregularity to palpation.  Psych:  Oriented X3, memory intact for recent and remote, normally interactive, good eye contact, not anxious appearing, not depressed appearing, and not agitated.      Impression & Recommendations:  Problem # 1:  HYPERLIPIDEMIA (ICD-272.2)  will plan for FLP in AM when returns fasting -  intol of statins - consider alt tx as needed   Labs Reviewed: SGOT: 26 (06/02/2006)   SGPT: 23 (06/02/2006)  Lipid Goals: Chol Goal: 200 (01/02/2007)   HDL Goal: 40 (01/02/2007)   LDL Goal: 130 (06/08/2007)   TG Goal: 150 (01/02/2007)  Prior 10 Yr Risk Heart Disease: Not enough information (06/08/2007)   HDL:48.0 (06/01/2007), 56.2 (01/02/2007)  LDL:97 (06/01/2007), DEL (54/12/8117)  Chol:182 (06/01/2007), 217 (01/02/2007)  Trig:183 (06/01/2007), 199 (01/02/2007)  Problem # 2:  PREMATURE VENTRICULAR CONTRACTIONS (ICD-427.69)  well controlled symptoms - cont same per cards Her updated medication list  for this problem includes:    Toprol Xl 50 Mg Tb24 (Metoprolol succinate) .Marland Kitchen... 1 tab by mouth once daily    Flecainide Acetate 50 Mg Tabs (Flecainide acetate) .Marland Kitchen... 1 tab by mouth two times a day  Echocardiogram:  -  Overall left ventricular systolic function was vigorous. Left         ventricular ejection fraction was estimated , range being 70         % to 75 %. There were no left ventricular regional wall         motion abnormalities. Left ventricular wall thickness was         mildly increased. Features were consistent with mild         diastolic dysfunction.   -  There was overall mild pulmonic regurgitation. It is possible         that there are two distinct jets, one very eccentric, the         other central. Could also consider the possibility of a         remnant PDA or prominent flow seen in a coronary. Does not         look significantly different compared to prior study  from         2007. (03/14/2007)  Labs Reviewed: Na: 142 (04/17/2007)   K+: 3.9 (04/17/2007)   CL: 106 (04/17/2007)   HCO3: 31 (04/17/2007) Mg: 2.0 (04/17/2007)   Ca: 9.3 (08/15/2008)   TSH: 2.15 (06/01/2007)   HCO3: 31 (04/17/2007)  Problem # 3:  CARCINOMA IN SITU, BREAST (ICD-233.0) on anastrozole - med mgmt as per onc ongoing - mammo just done - no abn identified  Problem # 4:  HYPOTHYROIDISM (ICD-244.9)  hx same but off meds for > 62yr - check TSH with labs in AM  Labs Reviewed: TSH: 2.15 (06/01/2007)    HgBA1c: 5.6 (01/02/2007) Chol: 182 (06/01/2007)   HDL: 48.0 (06/01/2007)   LDL: 97 (06/01/2007)   TG: 183 (06/01/2007)  Problem # 5:  HYPERTENSION, ESSENTIAL NOS (ICD-401.9)  Her updated medication list for this problem includes:    Toprol Xl 50 Mg Tb24 (Metoprolol succinate) .Marland Kitchen... 1 tab by mouth once daily  BP today: 128/68 Prior BP: 150/90 (04/07/2009)  Prior 10 Yr Risk Heart Disease: Not enough information (06/08/2007)  Labs Reviewed: K+: 3.9 (04/17/2007) Creat: : 1.0 (04/17/2007)   Chol: 182 (06/01/2007)   HDL: 48.0 (06/01/2007)   LDL: 97 (06/01/2007)   TG: 183 (06/01/2007)  Problem # 6:  SCREENING FOR DIABETES MELLITUS (ICD-V77.1) check glc in AM at pt request  Complete Medication List: 1)  Toprol Xl 50 Mg Tb24 (Metoprolol succinate) .Marland Kitchen.. 1 tab by mouth once daily 2)  Sm Vitamin B-12 Tr 1000 Mcg Tbcr (Cyanocobalamin) .Marland Kitchen.. 1074mcg/1cc im qmonth 3)  Prilosec 20 Mg Cpdr (Omeprazole) .... Take 1 tablet by mouth once a day 4)  Flecainide Acetate 50 Mg Tabs (Flecainide acetate) .Marland Kitchen.. 1 tab by mouth two times a day 5)  Anastrozole 1 Mg Tabs (Anastrozole) .Marland Kitchen.. 1 tab by mouth once daily 6)  Analpram-hc 1-2.5 % Crea (Hydrocortisone ace-pramoxine) .... Apply to affected area of rectum two times a day as needed  Other Orders: TD Toxoids IM 7 YR + (81191) Admin 1st Vaccine (47829)  Patient Instructions: 1)  it was good to see you today. 2)  contine all medications as  ongoing - no changes - 3)  labs in AM - lipids (272.2), TSH (244.9), and glucose (v77.1) - - your  results will be posted on the phone tree for review in 48-72 hours from the time of test completion; call [phone] and enter your 9 digit MRN (listed above on this page, just below your name); if any changes need to be made or there are abnormal results, you will be contacted directly.  4)  Please schedule a follow-up appointment in 6 months for review, sooner if problems.    Immunization History:  Influenza Immunization History:    Influenza:  historical (01/03/2009)  Immunizations Administered:  Tetanus Vaccine:    Vaccine Type: Td    Site: left deltoid    Mfr: Sanofi Pasteur    Dose: 0.5 ml    Route: IM    Given by: Orlan Leavens    Exp. Date: 02/18/2011    Lot #: [id number]    VIS given: 06/10/09    Colonoscopy  Procedure date:  05/16/2007  Findings:      Location:  [facility].  Results: Diverticulosis.         EGD  Procedure date:  05/16/2007  Findings:      Location: [facility]  Findings: Stricture:    Mammogram  Procedure date:  06/09/2009  Findings:      No specific mammographic evidence of malignancy.  Location: [facility].     Bone Density  Procedure date:  08/07/2008  Findings:      Location:  [facility].    AP Lumbar spine T-score -1.3 Z-score 1.3  Left femur neck T-score -1.5 Z-score 0.7

## 2010-05-07 NOTE — Assessment & Plan Note (Signed)
Summary: F/U Rectal pain, saw NP   History of Present Illness Visit Type: Follow-up Visit Primary GI MD: [name] MD Primary Provider: Newt Lukes MD Requesting Provider: n/a Chief Complaint: Follow up, some rectal pain, irritation History of Present Illness:   Ms Larocque is a 77 year old female with a history of diverticulosis, esophageal stricture, constipation and a family history of colon cancer and intermittent rectal pain. Patient was recently seen as an acute work in by Willette Cluster, NP for recurrent rectal pain which had occurred for two weeks. The pain was described as dull and constant and was predominantly left sided. Patient stated that at her GYN appointment previously she was found to be heme positive. However patient told us that she was having problems with hemorrhoids at the time. Patient has chronic constipation and was taking Miralax when needed; normally 2-3 times a week. Patient stated that she had a daily urge to defecate but felt that she was not adequately emptying her bowels. She has the feeling of "something being in the way". After patient's visit with Gunnar Fusi, she was asked to begin benefiber and increase her Miralax to 1 scoop daily. She also also given analpram to use for rectal irritation. A blood count completed at the time of her visit was normal. Of note, patient had left breast cancer in 2008 and then right breast cancer in 2010.  She is currently on oral chemotherapy. Patient comes today for a follow up of her pain. She continues to have some rectal pain and irritation but now denies any rectal bleeding. Her bowel habits are regular taking Benefiber 2 teaspoons in the morning and Metamucil 1 tablespoon at night.    GI Review of Systems      Denies abdominal pain, acid reflux, belching, bloating, chest pain, dysphagia with liquids, dysphagia with solids, heartburn, loss of appetite, nausea, vomiting, vomiting blood, weight loss, and  weight gain.     Reports rectal pain.     Denies anal fissure, black tarry stools, change in bowel habit, constipation, diarrhea, diverticulosis, fecal incontinence, heme positive stool, hemorrhoids, irritable bowel syndrome, jaundice, light color stool, liver problems, and  rectal bleeding.    Current Medications (verified): 1)  Toprol Xl 50 Mg  Tb24 (Metoprolol Succinate) .Marland Kitchen.. 1 Tab By Mouth Once Daily 2)  Sm Vitamin B-12 Tr 1000 Mcg  Tbcr (Cyanocobalamin) .Marland Kitchen.. 1076mcg/1cc Im Qmonth 3)  Prilosec 20 Mg  Cpdr (Omeprazole) .... Take 1 Tablet By Mouth Once A Day 4)  Flecainide Acetate 50 Mg Tabs (Flecainide Acetate) .Marland Kitchen.. 1 Tab By Mouth Two Times A Day 5)  Anastrozole 1 Mg Tabs (Anastrozole) .Marland Kitchen.. 1 Tab By Mouth Once Daily 6)  Analpram-Hc 1-2.5 % Crea (Hydrocortisone Ace-Pramoxine) .... Apply To Affected Area of Rectum Two Times A Day As Needed 7)  Clobetasol Propionate 0.05 % Crea (Clobetasol Propionate) .... Use On Rash As Needed  Allergies (verified): 1)  ! Levaquin 2)  ! Pcn 3)  ! Cephalexin 4)  ! Macrodantin 5)  ! Prevacid 6)  ! Vioxx 7)  ! * Codiene 8)  ! * Statins  Past History:  Past Medical History: Last updated: 06/10/2009 PREMATURE VENTRICULAR CONTRACTIONS  B12 DEFICIENCY (ICD-266.2) HYPERLIPIDEMIA (ICD-272.2) CARCINOMA IN SITU, BREAST (ICD-233.0) HEMORRHOIDS, INTERNAL (ICD-455.0) ESOPHAGEAL STRICTURE (ICD-530.3) DIVERTICULOSIS, COLON (ICD-562.10) OTHER ALOPECIA (ICD-704.09) HYPOTHYROIDISM (ICD-244.9) HYPERTENSION, ESSENTIAL NOS (ICD-401.9) HYPERLIPIDEMIA (ICD-272.2) ANEMIA-NOS (ICD-285.9)  MD rooster: onc - [facility] - D Eylin Pontarelli card -[facility] -[facility] -[facility] - [facility]  Past Surgical History: Last updated: 02/24/2009 Cath neg 2000; esophageal stricture 2006 Colon polypectomy Lumpectomy X2 Hysterectomy - 1979 Appendectomy - 2002 Tonsillectomy - 1942 Gall Bladder - 2002  Family History: Last updated: 06/08/2007 family history coronary artery bypass  graft family history DVT family history neoplasm, colon father suicide sister breast cancer has three sisters with breast cancer mother colon cancer sister diverticulitis maternal uncle CABG  Social History: Last updated: 06/10/2009 Never Smoked Alcohol use-no Retired Dipl[employer] Services operational officer Married, lives with spouse Regular Exercise - yes Drug Use - no  Review of Systems  The patient denies allergy/sinus, anemia, anxiety-new, arthritis/joint pain, back pain, blood in urine, breast changes/lumps, change in vision, confusion, cough, coughing up blood, depression-new, fainting, fatigue, fever, headaches-new, hearing problems, heart murmur, heart rhythm changes, itching, menstrual pain, muscle pains/cramps, night sweats, nosebleeds, pregnancy symptoms, shortness of breath, skin rash, sleeping problems, sore throat, swelling of feet/legs, swollen lymph glands, thirst - excessive , urination - excessive , urination changes/pain, urine leakage, vision changes, and voice change.         Pertinent positive and negative review of systems were noted in the above HPI. All other ROS was otherwise negative.   Vital Signs:  Patient profile:   77 year old female Height:      63 inches Weight:      160 pounds BMI:     28.45 BSA:     1.76 Pulse rate:   60 / minute Pulse rhythm:   regular BP sitting:   126 / 62  (left arm)  Vitals Entered By: Merri Ray CMA Duncan Dull) (July 30, 2009 8:40 AM)  Physical Exam  General:  Well developed, well nourished, no acute distress. Eyes:  PERRLA, no icterus. Mouth:  No deformity or lesions, dentition normal. Neck:  Supple; no masses or thyromegaly. Lungs:  Clear throughout to auscultation. Heart:  Regular rate and rhythm; no murmurs, rubs,  or bruits. Abdomen:  Soft, nontender and nondistended. No masses, hepatosplenomegaly or hernias noted. Normal bowel sounds. Rectal:  normal perianal area, anoscopic exam reveals small first grade hemorrhoids within the anal  canal. There is no active bleeding and there is no definite fissure. Rectal mucosa appears normal, stool is Hemoccult negative. Extremities:  No clubbing, cyanosis, edema or deformities noted. Skin:  Intact without significant lesions or rashes. Psych:  Alert and cooperative. Normal mood and affect.   Impression & Recommendations:  Problem # 1:  FAMILY HX COLON CANCER (ICD-V16.0) Patient will be due for a recall colonoscopy in February 2014.  Problem # 2:  RECTAL PAIN (QMV-784.69) Patient has periodic rectal irritation due to hemorrhoids and nonspecific irritation from straining. She will alternate Analpram cream with  Calmoseptine ointment.  Problem # 3:  B12 DEFICIENCY (ICD-266.2) She is on monthly B12 injections. She will continue on 1,000 mcg IM monthly.I have informed pt that  the drug manufacturing company stopped making B12 and there is a impending shortage of B12  Patient Instructions: 1)  Calmoseptine ointment p.r.n. rectal irritation. 2)  Analpram cream p.r.n. rectal irritation. 3)  Recall colonoscopy February 2014. 4)  Continue Vitamin B12 1,000 mcg IM monthly. 5)  continue fiber supplements- Benefiber and Metamucil. 6)  Copy sent to : Dr Felicity Coyer 7)  The medication list was reviewed and reconciled.  All changed / newly prescribed medications were explained.  A complete medication list was provided to the patient / caregiver.

## 2010-05-13 ENCOUNTER — Other Ambulatory Visit: Payer: Self-pay | Admitting: Obstetrics and Gynecology

## 2010-05-13 DIAGNOSIS — Z1239 Encounter for other screening for malignant neoplasm of breast: Secondary | ICD-10-CM

## 2010-05-15 ENCOUNTER — Encounter (INDEPENDENT_AMBULATORY_CARE_PROVIDER_SITE_OTHER): Payer: Self-pay | Admitting: *Deleted

## 2010-05-15 ENCOUNTER — Encounter (INDEPENDENT_AMBULATORY_CARE_PROVIDER_SITE_OTHER): Payer: Medicare Other

## 2010-05-15 ENCOUNTER — Other Ambulatory Visit: Payer: 59

## 2010-05-15 ENCOUNTER — Other Ambulatory Visit: Payer: Self-pay | Admitting: Internal Medicine

## 2010-05-15 ENCOUNTER — Encounter: Payer: Self-pay | Admitting: Internal Medicine

## 2010-05-15 DIAGNOSIS — E538 Deficiency of other specified B group vitamins: Secondary | ICD-10-CM

## 2010-05-15 LAB — VITAMIN B12: Vitamin B-12: 625 pg/mL (ref 211–911)

## 2010-05-21 NOTE — Assessment & Plan Note (Signed)
Summary: MONTHLY B12 SHOT....YF  Nurse Visit   Allergies: 1)  ! Levaquin 2)  ! Pcn 3)  ! Cephalexin 4)  ! Macrodantin 5)  ! Prevacid 6)  ! Vioxx 7)  ! * Codiene 8)  ! * Statins  Medication Administration  Injection # 1:    Medication: Vit B12 1000 mcg    Diagnosis: B12 DEFICIENCY (ICD-266.2)    Route: IM    Site: L deltoid    Exp Date: 02/2012    Lot #: [id number]    Mfr: American Regent    Patient tolerated injection without complications    Given by: Milford Cage NCMA (May 15, 2010 11:30 AM)  Orders Added: 1)  Vit B12 1000 mcg [J3420]

## 2010-06-12 ENCOUNTER — Encounter: Payer: Self-pay | Admitting: Internal Medicine

## 2010-06-12 ENCOUNTER — Encounter (INDEPENDENT_AMBULATORY_CARE_PROVIDER_SITE_OTHER): Payer: Medicare Other

## 2010-06-12 DIAGNOSIS — E538 Deficiency of other specified B group vitamins: Secondary | ICD-10-CM

## 2010-06-15 ENCOUNTER — Ambulatory Visit (INDEPENDENT_AMBULATORY_CARE_PROVIDER_SITE_OTHER): Payer: Medicare Other | Admitting: Internal Medicine

## 2010-06-15 ENCOUNTER — Encounter: Payer: Self-pay | Admitting: Internal Medicine

## 2010-06-15 ENCOUNTER — Other Ambulatory Visit: Payer: Medicare Other

## 2010-06-15 ENCOUNTER — Other Ambulatory Visit: Payer: Self-pay | Admitting: Internal Medicine

## 2010-06-15 DIAGNOSIS — E039 Hypothyroidism, unspecified: Secondary | ICD-10-CM

## 2010-06-15 DIAGNOSIS — E119 Type 2 diabetes mellitus without complications: Secondary | ICD-10-CM

## 2010-06-15 DIAGNOSIS — I10 Essential (primary) hypertension: Secondary | ICD-10-CM

## 2010-06-15 LAB — HEMOGLOBIN A1C: Hgb A1c MFr Bld: 6.6 % — ABNORMAL HIGH (ref 4.6–6.5)

## 2010-06-15 LAB — TSH: TSH: 2.56 u[IU]/mL (ref 0.35–5.50)

## 2010-06-16 ENCOUNTER — Ambulatory Visit
Admission: RE | Admit: 2010-06-16 | Discharge: 2010-06-16 | Disposition: A | Discharge status: Home / Self Care | Payer: Medicare Other | Source: Ambulatory Visit | Attending: Obstetrics and Gynecology | Admitting: Obstetrics and Gynecology

## 2010-06-16 DIAGNOSIS — Z1239 Encounter for other screening for malignant neoplasm of breast: Secondary | ICD-10-CM

## 2010-06-16 NOTE — Assessment & Plan Note (Signed)
Summary: Vit B 12 injection, Angeldejesus Callaham/Regina  Nurse Visit   Allergies: 1)  ! Levaquin 2)  ! Pcn 3)  ! Cephalexin 4)  ! Macrodantin 5)  ! Prevacid 6)  ! Vioxx 7)  ! * Codiene 8)  ! * Statins  Medication Administration  Injection # 1:    Medication: Vit B12 1000 mcg    Diagnosis: B12 DEFICIENCY (ICD-266.2)    Route: IM    Site: R deltoid    Exp Date: 11/13    Lot #: [id number]    Mfr: American Regent    Patient tolerated injection without complications    Given by: Lamona Curl CMA (AAMA) (June 12, 2010 9:38 AM)  Orders Added: 1)  Vit B12 1000 mcg [J3420]

## 2010-06-23 NOTE — Assessment & Plan Note (Signed)
Summary: 6 MOS FU/ # / CD   Vital Signs:  Patient profile:   77 year old female Weight:      155.4 pounds (70.64 kg) O2 Sat:      95 % on Room air Temp:     98.8 degrees F (37.11 degrees C) oral Pulse rate:   65 / minute BP sitting:   112 / 72  (left arm) Cuff size:   regular  Vitals Entered By: Orlan Leavens RMA (June 15, 2010 8:46 AM)  O2 Flow:  Room air CC: 6 month follow-up Is Patient Diabetic? No Pain Assessment Patient in pain? no        Primary Care Provider:  Newt Lukes MD  CC:  6 month follow-up.  History of Present Illness: here for f/u   1) breast cancer - initial dx 3/08 on left - then right side 3/10 - tol armidex following lumpectomy - s/p XRT - follows with onc regularly - mammo at breast center -  2) HTN - reports compliance with ongoing medical treatment and no changes in medication dose or frequency. denies adverse side effects related to current therapy. symptoms of PVCs managed with flecanide  3) GERD with B12 defic - follows closely with GI - regular shots for B12 replacment and takes PPI once daily - reports compliance with ongoing medical treatment and no changes in medication dose or frequency. denies adverse side effects related to current therapy.   4) hx hypothyroid - on synthroid > 5yr but none in past 6-105yr - denies sking or weight changes , no depression or mood changes- normal TSH 06/2009  5) dyslipidemia - intol of all statins - no specific med tx at this time - tries to monitor with diet and exercise, esp with spouse's DM diet  6) left ear and head pain summer 2011- mri brain unremarkable - following with ent - tube in ear by ent and PT -   7) DM2 - diet controlled - infreq home cbg check - reports compliance with ongoing medical treatment and no changes in medication dose or frequency. denies adverse side effects related to current therapy.   Clinical Review Panels:  Immunizations   Last Tetanus Booster:  Td (06/10/2009)  Last Flu Vaccine:  Historical (01/03/2009)   Last Pneumovax:  Pneumovax (02/16/2002)  Lipid Management   Cholesterol:  198 (06/11/2009)   LDL (bad choesterol):  117 (06/11/2009)   HDL (good cholesterol):  53.10 (06/11/2009)  CBC   WBC:  5.5 (07/03/2009)   RBC:  4.04 (07/03/2009)   Hgb:  13.1 (07/03/2009)   Hct:  38.9 (07/03/2009)   Platelets:  165.0 (07/03/2009)   MCV  96.4 (07/03/2009)   MCHC  33.5 (07/03/2009)   RDW  13.6 (07/03/2009)   PMN:  56.1 (07/03/2009)   Lymphs:  30.9 (07/03/2009)   Monos:  10.1 (07/03/2009)   Eosinophils:  2.1 (07/03/2009)   Basophil:  0.8 (07/03/2009)  Complete Metabolic Panel   Glucose:  132 (06/11/2009)   Sodium:  142 (04/17/2007)   Potassium:  3.9 (04/17/2007)   Chloride:  106 (04/17/2007)   CO2:  31 (04/17/2007)   BUN:  11 (04/17/2007)   Creatinine:  1.0 (04/17/2007)   Albumin:  3.9 (06/02/2006)   Total Protein:  7.0 (06/02/2006)   Calcium:  9.3 (08/15/2008)   Total Bili:  1.0 (06/02/2006)   Alk Phos:  55 (06/02/2006)   SGPT (ALT):  23 (06/02/2006)   SGOT (AST):  26 (06/02/2006)   Current Medications (verified):  1)  Toprol Xl 50 Mg  Tb24 (Metoprolol Succinate) .Marland Kitchen.. 1 Tab By Mouth Once Daily 2)  Sm Vitamin B-12 Tr 1000 Mcg  Tbcr (Cyanocobalamin) .Marland Kitchen.. 1082mcg/1cc Im Qmonth 3)  Prilosec 20 Mg  Cpdr (Omeprazole) .... Take 1 Tablet By Mouth Once A Day 4)  Flecainide Acetate 50 Mg Tabs (Flecainide Acetate) .Marland Kitchen.. 1 Tab By Mouth Two Times A Day 5)  Anastrozole 1 Mg Tabs (Anastrozole) .Marland Kitchen.. 1 Tab By Mouth Once Daily 6)  Analpram-Hc 1-2.5 % Crea (Hydrocortisone Ace-Pramoxine) .... Apply To Affected Area of Rectum Three Times A Day As Needed 7)  Cyanocobalamin 1000 Mcg/ml Soln (Cyanocobalamin) .... Take 1 Injection  Every Month  Allergies (verified): 1)  ! Levaquin 2)  ! Pcn 3)  ! Cephalexin 4)  ! Macrodantin 5)  ! Prevacid 6)  ! Vioxx 7)  ! * Codiene 8)  ! * Statins  Past History:  Past Medical History: PREMATURE VENTRICULAR  CONTRACTIONS  B12 DEFICIENCY  CARCINOMA IN SITU, BREAST R 3/08, L 3/10 ESOPHAGEAL STRICTURE, hx  DIVERTICULOSIS, COLON  HYPOTHYROID, hx of HYPERTENSION HYPERLIPIDEMIA -  intol of all statins ANEMIA-NOS    MD roster: onc - [facility] - D [facility] card -[facility] gyn -[facility] uro -[facility] gen surg - [facility] ent - [facility]  Review of Systems  The patient denies weight loss, chest pain, and abdominal pain.    Physical Exam  General:  alert, well-developed, well-nourished, and cooperative to examination.   spouse at side Lungs:  normal respiratory effort, no intercostal retractions or use of accessory muscles; normal breath sounds bilaterally - no crackles and no wheezes.    Heart:  normal rate, regular rhythm, no murmur, and no rub. BLE without edema.  Neurologic:  alert & oriented X3 and cranial nerves II-XII symetrically intact.  strength normal in all extremities, sensation intact to light touch, and gait cautious due to vertigo (no nystagmus) speech fluent without dysarthria or aphasia; follows commands with good comprehension.  Psych:  Oriented X3, memory intact for recent and remote, normally interactive, good eye contact, not anxious appearing, not depressed appearing, and not agitated.      Impression & Recommendations:  Problem # 1:  DIABETES MELLITUS, TYPE II (ICD-250.00)  Orders: TLB-A1C / Hgb A1C (Glycohemoglobin) (83036-A1C)  hx same - diet controlled  Labs Reviewed: Creat: 1.0 (04/17/2007)    Reviewed HgBA1c results: 5.6 (01/02/2007)  Problem # 2:  HYPOTHYROIDISM (ICD-244.9)  Orders: TLB-TSH (Thyroid Stimulating Hormone) (84443-TSH)  hx same but off meds for > 4yr - check TSH with labs in AM  Labs Reviewed: TSH: 1.45 (06/11/2009)    HgBA1c: 5.6 (01/02/2007) Chol: 198 (06/11/2009)   HDL: 53.10 (06/11/2009)   LDL: 117 (06/11/2009)   TG: 142.0 (06/11/2009)  Problem # 3:  HYPERTENSION, ESSENTIAL NOS (ICD-401.9)  Her updated medication list for this  problem includes:    Toprol Xl 50 Mg Tb24 (Metoprolol succinate) .Marland Kitchen... 1 tab by mouth once daily  Blood pressure controlled on present medications. Will continue.  BP today: 112/72 Prior BP: 115/53 (03/02/2010)  Prior 10 Yr Risk Heart Disease: Not enough information (06/08/2007)  Labs Reviewed: K+: 3.9 (04/17/2007) Creat: : 1.0 (04/17/2007)   Chol: 198 (06/11/2009)   HDL: 53.10 (06/11/2009)   LDL: 117 (06/11/2009)   TG: 142.0 (06/11/2009)  Complete Medication List: 1)  Toprol Xl 50 Mg Tb24 (Metoprolol succinate) .Marland Kitchen.. 1 tab by mouth once daily 2)  Sm Vitamin B-12 Tr 1000 Mcg Tbcr (Cyanocobalamin) .Marland Kitchen.. 1020mcg/1cc im qmonth 3)  Prilosec  20 Mg Cpdr (Omeprazole) .... Take 1 tablet by mouth once a day 4)  Flecainide Acetate 50 Mg Tabs (Flecainide acetate) .Marland Kitchen.. 1 tab by mouth two times a day 5)  Anastrozole 1 Mg Tabs (Anastrozole) .Marland Kitchen.. 1 tab by mouth once daily 6)  Analpram-hc 1-2.5 % Crea (Hydrocortisone ace-pramoxine) .... Apply to affected area of rectum three times a day as needed 7)  Cyanocobalamin 1000 Mcg/ml Soln (Cyanocobalamin) .... Take 1 injection  every month 8)  Aleve 220 Mg Tabs (Naproxen sodium) .Marland Kitchen.. 1-2 by mouth two times a day as needed for arthritis  Patient Instructions: 1)  it was good to see you today. 2)  test(s) ordered today - your results will be posted on the phone tree for review in 48-72 hours from the time of test completion; call [phone] and enter your 9 digit MRN (listed above on this page, just below your name); if any changes need to be made or there are abnormal results, you will be contacted directly.  3)  ok to use aleve as needed for arthritis symptoms  4)  Please schedule a follow-up appointment in 6-12 months for check on thyroid, sugar and blood pressure review, call sooner if problems.    Orders Added: 1)  Est. Patient Level IV [56213] 2)  TLB-TSH (Thyroid Stimulating Hormone) [84443-TSH] 3)  TLB-A1C / Hgb A1C (Glycohemoglobin)  [83036-A1C]  Appended Document: 6 MOS FU/ # / CD    Clinical Lists Changes  Medications: Removed medication of CYANOCOBALAMIN 1000 MCG/ML SOLN (CYANOCOBALAMIN) take 1 injection  every month

## 2010-07-09 ENCOUNTER — Ambulatory Visit (INDEPENDENT_AMBULATORY_CARE_PROVIDER_SITE_OTHER): Payer: Medicare Other | Admitting: Internal Medicine

## 2010-07-09 DIAGNOSIS — E538 Deficiency of other specified B group vitamins: Secondary | ICD-10-CM

## 2010-07-09 MED ORDER — CYANOCOBALAMIN 1000 MCG/ML IJ SOLN
1000.0000 ug | INTRAMUSCULAR | Status: DC
  Administered 2010-07-09 – 2011-04-22 (×11): 1000 ug via INTRAMUSCULAR

## 2010-07-16 ENCOUNTER — Other Ambulatory Visit: Payer: Self-pay | Admitting: Oncology

## 2010-07-16 ENCOUNTER — Encounter (HOSPITAL_BASED_OUTPATIENT_CLINIC_OR_DEPARTMENT_OTHER): Payer: Medicare Other | Admitting: Oncology

## 2010-07-16 DIAGNOSIS — Z170 Estrogen receptor positive status [ER+]: Secondary | ICD-10-CM

## 2010-07-16 DIAGNOSIS — C50319 Malignant neoplasm of lower-inner quadrant of unspecified female breast: Secondary | ICD-10-CM

## 2010-07-16 LAB — CBC WITH DIFFERENTIAL/PLATELET
BASO%: 1.1 % (ref 0.0–2.0)
Basophils Absolute: 0.1 10*3/uL (ref 0.0–0.1)
EOS%: 1.9 % (ref 0.0–7.0)
Eosinophils Absolute: 0.1 10*3/uL (ref 0.0–0.5)
HCT: 37.8 % (ref 34.8–46.6)
HGB: 13.2 g/dL (ref 11.6–15.9)
MONO#: 0.5 10*3/uL (ref 0.1–0.9)
MONO%: 8.7 % (ref 0.0–14.0)
NEUT#: 2.8 10*3/uL (ref 1.5–6.5)
NEUT%: 51.9 % (ref 38.4–76.8)
Platelets: 155 10*3/uL (ref 145–400)
RBC: 4.1 10*6/uL (ref 3.70–5.45)
WBC: 5.3 10*3/uL (ref 3.9–10.3)

## 2010-07-16 LAB — COMPREHENSIVE METABOLIC PANEL
AST: 17 U/L (ref 0–37)
Albumin: 3.9 g/dL (ref 3.5–5.2)
Alkaline Phosphatase: 73 U/L (ref 39–117)
BUN: 16 mg/dL (ref 6–23)
Glucose, Bld: 117 mg/dL — ABNORMAL HIGH (ref 70–99)
Potassium: 4.3 mEq/L (ref 3.5–5.3)
Sodium: 140 mEq/L (ref 135–145)
Total Bilirubin: 0.6 mg/dL (ref 0.3–1.2)

## 2010-07-24 ENCOUNTER — Encounter (HOSPITAL_BASED_OUTPATIENT_CLINIC_OR_DEPARTMENT_OTHER): Payer: Medicare Other | Admitting: Oncology

## 2010-07-24 ENCOUNTER — Other Ambulatory Visit: Payer: Self-pay | Admitting: Oncology

## 2010-07-24 DIAGNOSIS — Z853 Personal history of malignant neoplasm of breast: Secondary | ICD-10-CM

## 2010-07-24 DIAGNOSIS — C50319 Malignant neoplasm of lower-inner quadrant of unspecified female breast: Secondary | ICD-10-CM

## 2010-07-24 DIAGNOSIS — Z170 Estrogen receptor positive status [ER+]: Secondary | ICD-10-CM

## 2010-07-24 DIAGNOSIS — C50919 Malignant neoplasm of unspecified site of unspecified female breast: Secondary | ICD-10-CM

## 2010-08-05 ENCOUNTER — Telehealth: Payer: Self-pay | Admitting: Internal Medicine

## 2010-08-05 NOTE — Telephone Encounter (Signed)
Patient calling to report for the last week, she has had acid reflux and burning. She had been on Prilosec daily but she stopped it for a few days and tried ranitidine, liquid antacid and Tums. This did not help and she restarted the Prilosec daily. C/O a lot of burping and after she eats, she "feels yucky and has nausea." Denies vomiting. Patient will increase Prilosec to BID. She states she is coming for a Vit B 12 shot on Monday and has OV on 08/21/10. Please, advise if any other suggestions.

## 2010-08-05 NOTE — Telephone Encounter (Signed)
Reviewed and agree with Prelosec 20 mg po bid

## 2010-08-06 NOTE — Telephone Encounter (Signed)
Patient notified that Dr. Juanda Chance does agree for her to increase the Prilosec to BID. Patient to call for further problems.

## 2010-08-10 ENCOUNTER — Ambulatory Visit (INDEPENDENT_AMBULATORY_CARE_PROVIDER_SITE_OTHER): Payer: Medicare Other | Admitting: Internal Medicine

## 2010-08-10 ENCOUNTER — Ambulatory Visit
Admission: RE | Admit: 2010-08-10 | Discharge: 2010-08-10 | Disposition: A | Discharge status: Home / Self Care | Payer: Medicare Other | Source: Ambulatory Visit | Attending: Oncology | Admitting: Oncology

## 2010-08-10 DIAGNOSIS — C50919 Malignant neoplasm of unspecified site of unspecified female breast: Secondary | ICD-10-CM

## 2010-08-10 DIAGNOSIS — E538 Deficiency of other specified B group vitamins: Secondary | ICD-10-CM

## 2010-08-11 ENCOUNTER — Other Ambulatory Visit: Payer: Self-pay | Admitting: *Deleted

## 2010-08-11 MED ORDER — METOPROLOL SUCCINATE ER 50 MG PO TB24: 50.0000 mg | ORAL_TABLET | Freq: Every day | ORAL | Status: DC

## 2010-08-12 ENCOUNTER — Telehealth: Payer: Self-pay | Admitting: Cardiology

## 2010-08-12 NOTE — Telephone Encounter (Signed)
Pt wants to know why her medication Toprol  was changed to gentric metoprolol  Wasn't notified of this. Pt aware that debra is not in the office today.

## 2010-08-12 NOTE — Telephone Encounter (Signed)
Spoke with pt, questions regarding toprol answered Deliah Goody

## 2010-08-18 NOTE — Op Note (Signed)
NAMECHOUA, IKNER              ACCOUNT NO.:  [id number]   MEDICAL RECORD NO.:  [id number]          PATIENT TYPE:  AMB   LOCATION:  [facility]                          FACILITY:  [facility]   PHYSICIAN:  Sandria Bales. Ezzard Standing, M.D.  DATE OF BIRTH:  05-18-1927   DATE OF PROCEDURE:  06/26/2008  DATE OF DISCHARGE:                               OPERATIVE REPORT   Date of Surgery ?   PREOPERATIVE DIAGNOSIS:  Right breast carcinoma 9 o'clock position.   POSTOPERATIVE DIAGNOSIS:  Right breast carcinoma 9 o'clock position,  negative sentinel lymph node biopsy (touch prep negative per Dr. Delila Spence)   PROCEDURE:  Right breast needle localized lumpectomy, right axillary  sentinel lymph node biopsy.   SURGEON:  Sandria Bales. Ezzard Standing, MD   FIRST ASSISTANT:  Dannielle Burn, PA student.   ANESTHESIA:  General endotracheal with 30 mL of 0.25% Marcaine.   COMPLICATIONS:  None.   INDICATION FOR PROCEDURE:  Ms. Sigman is a 77 year old white female,  patient of Dr. Marga Melnick, who actually had a left breast carcinoma  treated by Dr. Kendrick Ranch in March 2008 with a lumpectomy.  She saw Dr.  Darnelle Catalan who had her on tamoxifen, and saw Dr. Margaretmary Dys, but the  decision was not to do any further radiation therapy.   With this new breast cancer, I reviewed the options for treatment which  include mastectomy and lumpectomy.  Again, I think she is a good  candidate for lumpectomy and talked about the indications and potential  complications of lumpectomy and sentinel lymph node biopsy.   The risks include bleeding, infection, nerve injury, recurrence of the  tumor, and if her axillary sentinel is positive, then I would go ahead  and do full axillary node dissection on her.   OPERATIVE NOTE:  The patient was placed in a supine position after  general endotracheal anesthetic.  She had a wire coming out of her right  breast.  I marked the right breast.  Her right breast had been injected  with 1 mCi of technetium  sulfa colloid just using the NeoProbe alone.  I  found a fairly hot spot in the axilla, so I did not inject her breast  with methylene blue.   I draped her breast and axilla, did a time-out identifying the patient  and procedure.   I started my dissection in the axilla where I identified a hot lymph  node with counts of close to 600, background was maybe 5-10, and this  was sent for touch preps.  Touch prep per Dr. Tammi Sou was negative.  I controlled hemostasis primarily with Bovie electrocautery.   I then turned my attention to the right breast to clip specimen and  specimen were about 4 cm in from the skin.  The wire actually came out  fairly lateral at about maybe 8 o'clock position of the right breast.  I  cut out a block of breast tissue approximately 6-7 cm long and about 4  cm in diameter and went down to the chest wall.   I marked this specimen with a paint for orientation.  Specimen mammogram  confirmed the clip and the wire were well within the middle of the  specimen.   I took out 2 additional pieces of tissue, one was in axillary fat pad  which was not, this will be sent separately and then I went and took the  cranial margin of what I had excised because there was just some firm  breast tissue that it did not look grossly malignant.   This was marked also with paint and a long suture medial.   Each wound was irrigated with saline, infiltrated with 30 mL of 0.25%  Marcaine plain and subcutaneous tissues were closed with 3-0 Vicryl  suture and the skin closed with 5-0 Monocryl suture, painted with  Dermabond, and sterilely dressed with 4x4s and a pressure dressing of an  Ace.   The patient tolerated the procedure well.  Sponge and needle counts were  correct at the end of the case.      Sandria Bales. Ezzard Standing, M.D.  Electronically Signed     DHN/MEDQ  D:  06/28/2008  T:  06/29/2008  Job:  [id number]   cc:   Titus Dubin. Alwyn Ren, MD,FACP,FCCP  Malachi Pro. Ambrose Mantle,  M.D.  Maretta Bees. Vonita Moss, M.D.  Valentino Hue. Magrinat, M.D.  Artist Pais Kathrynn Running, M.D.  Norva Pavlov, M.D.

## 2010-08-18 NOTE — Assessment & Plan Note (Signed)
[facility]                            CARDIOLOGY OFFICE NOTE   WAYNESHA, RAMMEL                     MRN:          [id number]  DATE:05/03/2007                            DOB:          1928-03-31    Mrs. Mullenax is very pleasant 77 year old female who I have seen the  past for palpitations and question pulmonic valve abnormality.  Her LV  function is normal.  When I last saw her on April 17, 2007, she was  having worsen palpitations.  We did check electrolytes in her potassium  was 3.9 with magnesium 2.0.  TSH was normal.  We did a Holter monitor  and this showed sinus with occasional PAC and PVC.  Her heart rate was  in the upper 50s to 60 range.  Since then she does continue to have  occasional palpitations that are described as flutter.  She did have  these while the monitor was in place and we did see PVCs.  There are no  sustained arrhythmias.  She has not had syncope, shortness of breath or  chest pain.  She occasionally feels fatigued.  Her medications include  50 mg p.o. b.i.d. vitamin B12 injections and Zantac 150 mg p.o. daily.   PHYSICAL EXAM:  Today shows a blood pressure 108/62 and pulse is 65.  She weighs 149 pounds.  HEENT:  Normal.  NECK:  Supple.  CHEST:  Clear.  CARDIOVASCULAR:  Regular rate.  Her abdominal exam is benign.  EXTREMITIES:  Show no edema.   DIAGNOSIS:  1. Palpitations - Holter monitor shows this is secondary to PVCs.  She      will continue on the higher dose of Toprol and this has improved      her symptoms although not completely alleviating.  I have reassured      her that the setting of normal LV function, these were benign.  If      they become more problematic in the future then we could consider      the addition of an antiarrhythmic such as flecainide.  However, I      would like to avoid that if possible.  2. Premature ventricular contractions - she will continue on beta      blocker as described  in #1.  3. Question of pulmonic valve abnormality previously - her repeat      echocardiogram showed no significant change and right heart was      normal.  I do not think we need pursue this further.  4. History of hypothyroidism - a TSH was normal.  5. Hyperlipidemia - per primary care physician.   She will see [place] back in 6 months.     Madolyn Frieze Jens Som, MD, [facility]  Electronically Signed    BSC/MedQ  DD: 05/03/2007  DT: 05/03/2007  Job #: [id number]

## 2010-08-18 NOTE — Assessment & Plan Note (Signed)
[facility]                            CARDIOLOGY OFFICE NOTE   [name], [name]                     MRN:          [id number]  DATE:08/29/2007                            DOB:          1927-05-12    Tami Jacobson is a pleasant female who has a history of palpitations felt  secondary to PACs and PVCs with normal LV function.  When I last saw  her, she continued to complain of palpitations despite beta blockade.  We therefore added flecainide 50 mg p.o. b.i.d.  An exercise treadmill  on July 26, 2007 showed no exercise-induced arrhythmias.  We did  decrease her Toprol at that time from 50 mg p.o. b.i.d. to 50 mg p.o.  daily secondary to fatigue.  Note, the patient has had a TSH and  electrolytes checked previously that were normal.  Since I started her  on the flecainide, she feels much better.  She has not had any  palpitations since then.  There is no chest pain, shortness of breath,  presyncope or pedal edema.   MEDICATIONS:  1. Vitamin B12 injections.  2. Prilosec.  3. Toprol-XL 50 mg daily.  4. Flecainide 50 mg p.o. b.i.d.   PHYSICAL EXAMINATION:  VITAL SIGNS:  Blood pressure of 113/62, pulse 57.  She weighs 150 pounds.  HEENT:  Normal.  NECK:  Supple.  CHEST:  Clear.  CARDIOVASCULAR:  Regular rhythm.  ABDOMEN:  No tenderness.  EXTREMITIES:  No edema.   DIAGNOSES:  1. Palpitations secondary to premature atrial contractions and      premature ventricular contractions.  The patient will continue on      her Toprol-XL at 50 mg daily and flecainide 50 mg by mouth twice a      day.  The flecainide has markedly improved her symptoms.  Note,  a      previous exercise treadmill has shown no exercise-induced      arrhythmias after initiating the flecainide.  2. Premature ventricular contractions.  As per #.  3. Question history of pulmonic valve abnormality.  Her repeat      echocardiogram recently showed no significant change, and her  right      heart was normal.  We will not pursue this further.  4. Hypothyroidism.  Management per her primary care physician.  5. Hyperlipidemia.  Management per her primary care physician.   We will see her back in 6 months.     Madolyn Frieze Jens Som, MD, [facility]  Electronically Signed    BSC/MedQ  DD: 08/29/2007  DT: 08/29/2007  Job #: [id number]

## 2010-08-18 NOTE — Assessment & Plan Note (Signed)
[facility]                         GASTROENTEROLOGY OFFICE NOTE   ADAJAH, COCKING                     MRN:          [id number]  DATE:05/04/2007                            DOB:          1927-05-06    Tami Jacobson is a very nice 77 year old white female with a family  history of colon cancer, severe diverticulosis of the left colon,  history of gastroesophageal reflux disease with esophageal stricture,  dilated November 2002, last dilatation March 2008.  She was just  diagnosed with intraductal carcinoma of the breast in March 2008, but  has not had any treatment.  She has left carotid bruit, hypothyroidism,  hyperlipidemia, and mitral valve prolapse.  We have done numerous  colonoscopies on her, the last one was in 2003.  Flexible sigmoidoscopy  was done in December 2004, confirming presence of internal hemorrhoids.   She is here today with 2 concerns:  One is epigastric pain for the past  3 to 4 weeks which occurs regardless of her taking Ranitidine 150 mg a  day.  She has most of the pain before meals.  The pain sometimes bothers  her when she goes to bed.  There has been some nausea, but no vomiting.  Tami Jacobson had a laparoscopic cholecystectomy and internal appendectomy  in April 2003.  She has had some intentional weight loss over the past  year of about 15 to 20 pounds.   Another concern of hers is rectal pain which has bothered her when she  is having a bowel movement which are somewhat constipated.  She has  taken MiraLax for the constipation.  We have seen internal hemorrhoids  on the last exam in 2004.  She is due for colonoscopy this year.   MEDICATIONS:  1. Toprol XL 50 mg p.o. b.i.d.  2. Ranitidine 150  mg daily.  3. B12 injection monthly.  4. Tamoxifen 20 mg daily.   PHYSICAL EXAMINATION:  Blood pressure 126/72, pulse 78, and weight 148  pounds.  She was somewhat anxious, teary eyed.  LUNGS:  Clear to auscultation.  NECK:   Supple with no lymphadenopathy.  COR:  Normal S1, normal S2.  ABDOMEN:  Soft but tender in the midline and the epigastrium above the  umbilicus.  The pain did not radiate to left or right upper quadrants.  Liver edge was at costal margin.  Post laparoscopic cholecystectomy  scars.  Lower abdomen was normal.  No pain or distention.  RECTAL EXAM/ANOSCOPIC EXAM:  She had external hemorrhoidal tag, normal  rectal tone, and also 1st grade, bluish internal hemorrhoids which were  somewhat edematous.  There was no bleeding, stool was Hemoccult  negative.   IMPRESSION:  44. A 77 year old white female with symptomatic internal hemorrhoids      which were 1st grade hemorrhoids.  These are chronic.  2. Functional constipation.  3. Severe left colon diverticulosis confirmed on colonoscopy 2004.  4. Family history of colon cancer in a direct relative.  Patient due      for colonoscopy this year.  5. Onset of epigastric pain, not responsive to Zantac.  Rule out  functional pain, rule out peptic ulcer.  6. Status post remote cholecystectomy.  7. History of esophageal stricture.  8. Newly diagnosed intraductal breast carcinoma in March of 2008.  The      patient is currently on Tamoxifen.   PLAN:  1. Upper abdominal ultrasound to look at the pancreas.  2. Esophagogastroduodenoscopy and colonoscopy scheduled.  3. Switch from Zantac to Prilosec 20 mg daily.  4. Anusol HC suppositories two h.s.  I would consider banding or      injecting internal hemorrhoids at the time of colonoscopy.     Hedwig Morton. Juanda Chance, MD  Electronically Signed    DMB/MedQ  DD: 05/04/2007  DT: 05/04/2007  Job #: [id number]   cc:   Titus Dubin. Alwyn Ren, MD,FACP,FCCP

## 2010-08-18 NOTE — Procedures (Signed)
[facility]                              EXERCISE TREADMILL   ZALEA, PETE                     MRN:          [id number]  DATE:07/26/2007                            DOB:          May 10, 1927    Mrs. Prest is a 77 year old female who has been having problems with  palpitations secondary to PVCs.  This is indeed despite Toprol therapy.  We, therefore, recently added flecainide to see if this would improve  her symptoms.  This study is performed to exclude exercise-induced  arrhythmia following and the addition of flecainide.   The patient exercised for a duration of 3 minutes and 32 seconds on  Bruce protocol which is equivalent of 5.2 METS.  Her heart rate  increased from resting of 59 to a maximum of 86 which was 60% of her  predicted maximum.  Blood pressure increased from 47/61 to 179/59.  There was no chest pain during the study.  There is no exercise-induced  arrhythmias.  There were no ST changes.  The study was terminated  secondary to fatigue.   FINAL INTERPRETATION:  Exercise treadmill with no evidence of exercise-  induced arrhythmias.  There was also no chest pain and no electrographic  changes.   The patient was also complaining of some fatigue today.  I have asked  her to decrease Toprol to 50 mg once daily to see if this improves her  symptoms.  She will follow up in May as scheduled.     Madolyn Frieze Jens Som, MD, [facility]  Electronically Signed    BSC/MedQ  DD: 07/26/2007  DT: 07/26/2007  Job #: [id number]

## 2010-08-18 NOTE — Assessment & Plan Note (Signed)
[facility]                            CARDIOLOGY OFFICE NOTE   ALIAYAH, TYER                     MRN:          [id number]  DATE:02/22/2008                            DOB:          [birth year]    Mrs. Batton is a pleasant 77 year old female who has a history of PACs  and PVCs with normal LV function.  He recalls significant palpitations  in the past despite beta-blockade.  We therefore back in the spring  added flecainide 50 mg p.o. b.i.d. and her symptoms have improved  markedly.  She occasionally feels her heart skip, but these are not  sustained and they are bearable.  They are much improved compared to  previous.  She denies any dyspnea, chest pain, or syncope.  There is no  pedal edema.   MEDICATIONS:  1. Vitamin B12 injections.  2. Prilosec.  3. Toprol 50 mg daily.  4. Flecainide 50 mg p.o. b.i.d.   PHYSICAL EXAMINATION:  VITAL SIGNS:  Today shows a blood pressure  100/60, pulse is 57.  She weighs 150 pounds.  HEENT:  Normal.  NECK:  Supple.  CHEST:  Clear.  CARDIOVASCULAR:  Regular rate and rhythm.  ABDOMEN:  No tenderness.  EXTREMITIES:  No edema.   Her electrocardiogram shows a sinus rhythm at a rate of 57.  The axis is  normal.  There are no significant ST changes noted.   DIAGNOSES:  1. Palpitations - these are much improved on the present medication.      She will continue with her Toprol and flecainide.  2. Premature ventricular contractions - these are much improved on the      present medication.  She will continue with her Toprol and      flecainide.  3. Question history of pulmonic valve abnormality - her repeat      echocardiogram recently showed no significant change and her right      heart was normal and we are not pursuing this further.  4. Hypothyroidism - management per her primary care physician.  5. Hyperlipidemia - management per her primary care physician.   We will see her back in 1 year.     Madolyn Frieze Jens Som, MD, [facility]  Electronically Signed    BSC/MedQ  DD: 02/22/2008  DT: 02/22/2008  Job #: [id number]

## 2010-08-18 NOTE — Assessment & Plan Note (Signed)
[facility]                            CARDIOLOGY OFFICE NOTE   Tami, Jacobson                     MRN:          [id number]  DATE:03/08/2007                            DOB:          10/15/1927    Tami Jacobson returns for followup today.  She had an echocardiogram  performed on December 20, 2005 that showed normal LV function.  There  was mild mitral regurgitation, mild aortic root dilatation.  Dr. Eden Emms  interpreted the study and was concerned about a possible color flow jet  distal to the pulmonic valve.  I did review this and we have elected to  plan followup echocardiogram 12 months after the last one.  Note, there  is a question of a history of mitral valve prolapse as well.  When I  last saw her she had been having problems with visual disturbances.  She  was seen by Dr. Nash Shearer and does not recall any significant need for  further evaluation per her report.  I did not have those records  available.  Since I last saw her she has been diagnosed with breast  cancer and had a lumpectomy.  She has otherwise done well.  No dyspnea,  chest pain, recurrent palpitations, pedal edema or syncope.   PRESENT MEDICATIONS:  1. Toprol 50 mg p.o. daily.  2. Vitamin B-12.  3. Zantac.  4. Trimethoprim.   PHYSICAL EXAMINATION:  Shows a blood pressure of 137/65 and her pulse is  68.  She weighs 146 pounds.  HEENT:  Normal.  NECK:  Supple.  CHEST:  Clear.  CARDIOVASCULAR:  Reveals a regular rate and rhythm.  ABDOMINAL:  Shows no tenderness.  EXTREMITIES:  Show no edema.   DIAGNOSES:  1. Question pulmonic valve abnormality -- We will plan to repeat her      echocardiogram.  I did review the previous echocardiogram, was not      convinced there was any significant abnormality.  This would also      help re-assess the mitral valve.  2. History of visual disturbances, question transient ischemic attacks      -- Apparently this has been evaluated by  Neurology and the issue      has resolved.  3. History of palpitations.  4. History of hypothyroidism.  5. Hyperlipidemia.   We will see her back on an as-needed basis.     Madolyn Frieze Jens Som, MD, [facility]  Electronically Signed    BSC/MedQ  DD: 03/08/2007  DT: 03/08/2007  Job #: [id number]

## 2010-08-18 NOTE — Assessment & Plan Note (Signed)
[facility]                            CARDIOLOGY OFFICE NOTE   ALIZZA, SACRA                     MRN:          [id number]  DATE:07/18/2007                            DOB:          02-Oct-1927    Tami Jacobson is a pleasant female whom I have seen in the past for  palpitations and questionable pulmonic valve abnormality.  Her LV  function is preserved.  A previous Holter monitor has shown PACs and  PVCs that correlate with her palpitations.  I last saw her on May 03, 2007.  Over the past 2 weeks she has had worsening palpitations.  The continue to be described as a skip.  Approximately 10 days ago she  had a prolonged period of time where she was experiencing palpitations.  These are continued to be described as a skip.  There is no chest pain,  shortness of breath, presyncope or syncope.  Her medications at present  include Toprol 50 mg p.o. b.i.d., vitamin B12 injections, and Prilosec.   PHYSICAL EXAM:  Today shows a blood pressure of 108/66 and pulse is 63.  She weighs 150 pounds.  HEENT:  Normal.  NECK:  Supple.  CHEST:  Clear.  CARDIOVASCULAR:  Regular rate.  ABDOMEN:  Exam shows no tenderness.  EXTREMITIES:  Showed no edema.   Her electrocardiogram today shows a sinus rhythm at a rate of 63.  There  are no significant ST changes.   DIAGNOSES:  1. Continued palpitations - Tami Jacobson is continued to be bothered      with palpitations.  We have documented previously that these are      PACs and PVCs.  She is on 50 mg Toprol twice daily and blood      pressure is borderline.  I would be hesitant to increase this      further.  Given that she continues to be symptomatic, she would      like to try flecainide 50 mg p.o. b.i.d.  Hopefully, this will      suppress her PACs and PVCs.  I will schedule her for a treadmill      early next week to exclude exercise-induced arrhythmias given the      addition of the flecainide.  Note, her  previous TSH, electrolytes      were normal.  Also note her LV function is normal.  There is no      history of documented coronary disease.  2. Premature ventricular contractions - as per above.  3. Question pulmonic valve abnormality - her repeat echocardiogram      recently showed no significant change and the right heart was      normal.  We will not pursue this further.  4. Hypothyroidism.  5. Hyperlipidemia.   She will see [place] back in 6 to 8 weeks.     Madolyn Frieze Jens Som, MD, [facility]  Electronically Signed    BSC/MedQ  DD: 07/18/2007  DT: 07/18/2007  Job #: [id number]

## 2010-08-18 NOTE — Assessment & Plan Note (Signed)
[facility]                            CARDIOLOGY OFFICE NOTE   ANASHA, PERFECTO                     MRN:          [id number]  DATE:04/17/2007                            DOB:          01-23-1928    Mrs. Curnutt is very pleasant female that I have seen in the past for a  question pulmonic valve abnormality as well as palpitations.  I last saw  on March 08, 2007.  At that time we repeated her echocardiogram.  This  was interpreted by Dr. Diona Browner.  LV function was vigorous with an  ejection fraction of 70-75%.  There was mild thickening of the mitral  valve involving the anterior and posterior leaflets with trivial mitral  regurgitation.  There was mild pulmonic insufficiency.  It was felt that  there were possibly 2 distinct jets with 1 being various eccentric.  They also stated that it could be a possible remnant of a PDA or a  problem with flow in the coronary artery.  However it was unchanged.  Of  note, the right atrium was normal in size as was the right ventricle.  The patient has scheduled followup today.  She is having worsening  palpitations.  She describes these as a heart stop followed by near-  syncope.  They are nonsustained.  There is no associated dyspnea on  exertion, orthopnea, PND, syncope or chest pain.  She did present to the  office on Thursday of last week due to these episodes.  Electrocardiogram was performed on April 13, 2007.  She had a normal  sinus rhythm at a rate of 60.  Prior inferior infarct cannot be  excluded.   NOTE:  The inferior Qs have been present previously.  Also note that she  has had a cardiac catheterization in June 2000 that showed normal  coronary arteries in the last Myoview in October 2004 showed an ejection  fraction of 70% and probable normal perfusion.   PRESENT MEDICATIONS:  1. Include Toprol 50 mg p.o. daily.  2. Vitamin B12.  3. Zantac 150 mg p.o. daily.   PHYSICAL EXAM:  Today shows a  blood pressure of 130/70 and pulse is 64.  She weighs 148 pounds.  HEENT:  Normal.  NECK:  Supple.  CHEST:  Clear.  CARDIOVASCULAR:  Regular rate and rhythm.  ABDOMEN:  Exam shows no tenderness.  EXTREMITIES:  Show no edema.   DIAGNOSIS:  1. Recurrent palpitations - These sound to be probable premature      ventricular contractions.  She is having these on a daily basis.      We will therefore proceed with 48-hour Holter monitor to more fully      evaluate.  Note:  She has also increased her Toprol to 50 mg p.o.      b.i.d. and I think this is appropriate.  We can increase this      further as needed for symptomatic relief as tolerated by pulse and      blood pressure.  Note:  We will also plan to repeat TSH, magnesium  and potassium.  She did have an echocardiogram recently showed      normal LV function.  I have explained that in the setting of normal      LV function PVCs are benign.  2. Question of pulmonic valve abnormality - her repeat echocardiogram      did not show any significant change.  I do not think we need to      pursue this further.  3. History of hypothyroidism.  4. Hyperlipidemia - per primary care physician.   I will see back in 6 weeks.     Madolyn Frieze Jens Som, MD, [facility]  Electronically Signed    BSC/MedQ  DD: 04/17/2007  DT: 04/17/2007  Job #: [id number]

## 2010-08-21 ENCOUNTER — Ambulatory Visit (INDEPENDENT_AMBULATORY_CARE_PROVIDER_SITE_OTHER): Payer: Medicare Other | Admitting: Internal Medicine

## 2010-08-21 ENCOUNTER — Encounter: Payer: Self-pay | Admitting: Internal Medicine

## 2010-08-21 VITALS — BP 128/64 | HR 58 | Ht 63.0 in | Wt 154.0 lb

## 2010-08-21 DIAGNOSIS — R195 Other fecal abnormalities: Secondary | ICD-10-CM

## 2010-08-21 NOTE — Assessment & Plan Note (Signed)
[place] [facility]                              CARDIOLOGY OFFICE NOTE   Tami Jacobson, Tami Jacobson                     MRN:          [id number]  DATE:01/05/2006                            DOB:          Sep 27, 1927    HISTORY OF PRESENT ILLNESS:  Tami Jacobson is a very pleasant female who has  a history of atypical chest pain, palpitations with a negative evaluation in  the past.  In August, she had transient blurred vision times 15 minutes.  There was no dysarthria, loss of strength or sensation to her extremities or  other cardiac symptoms.  There was no palpitations.  She also had two  separate episodes on a day in September.  She has been seen by an  ophthalmologist (Dr. Harlin Heys) and felt potentially to have a TIA.  Note,  each episode was similar and occurred in the same location with no other  associated symptoms.  She had carotid Doppler's performed which showed  normal coronary arteries bilaterally on December 20, 2005.  An  echocardiogram was performed on December 20, 2005, that showed normal LV  function with mild LVH.  There was mild aortic root dilatation.  Dr. Eden Emms  interpreted the study and said that there was a color flow signal just  distal and perpendicular to the pulmonary valve, but looked different.  She has had no further problems since then.  Dr. Alwyn Ren asked that we  evaluated based on the echocardiogram.   MEDICATIONS:  1. Toprol 50 mg p.o. daily.  2. Zantac 150 mg p.o. daily.  3. Vitamin B12 and fish oil.   PHYSICAL EXAMINATION:  VITAL SIGNS:  Blood pressure 138/64, pulse 63, weight  163 pounds.  NECK:  Supple.  No bruits.  CHEST:  Clear.  CARDIOVASCULAR:  Regular rate and rhythm.  I could not appreciate murmurs,  rubs or gallops.  ABDOMEN:  No pulsatile masses.  No bruits.  EXTREMITIES:  No edema.   STUDIES:  Electrocardiogram shows a sinus rhythm at a rate of 63.  Prior  inferior infarct could not be excluded.  It is  unchanged from previous.   DIAGNOSES:  1. Question recent transient ischemic attack.  2. History of palpitations.  3. History of hyperthyroidism.   PLAN:  Tami Jacobson presents with transient digital disturbances.  There is  a question of a TIA.  Carotid Doppler's are normal.  Echocardiogram showed  normal LV function.  There is question of an abnormality of the pulmonic  valve.  I will review the echocardiogram and make a decision about further  need for a TEE, etc.  We will also have neurologist evaluate the patient for  question of TIA.  I have asked her to continue with the present medications  but begin aspirin  325 mg p.o. daily.  I will see her back in approximately three months after  she is seen by the neurologist.  She may need a MRI or CT, but I will leave  this to them.            ______________________________  Madolyn Frieze. Jens Som, MD, [facility]  BSC/MedQ  DD:  01/05/2006  DT:  01/06/2006  Job #:  [id number]   cc:   Titus Dubin. Alwyn Ren, MD,FACP,FCCP

## 2010-08-21 NOTE — Op Note (Signed)
. [facility]  Patient:    Tami Jacobson, Tami Jacobson Visit Number: [id number] MRN: [id number]          Service Type: DSU Location: [id number] Attending Physician:  Shelly Rubenstein Dictated by:   Abigail Miyamoto, M.D. Proc. Date: 07/07/01 Admit Date:  07/07/2001                             Operative Report  PREOPERATIVE DIAGNOSES: 1. Biliary dyskinesia. 2. Chronic appendicitis.  POSTOPERATIVE DIAGNOSES: 1. Biliary dyskinesia. 2. Chronic appendicitis.  PROCEDURES: 1. Laparoscopic cholecystectomy. 2. Laparoscopic appendectomy.  SURGEON: Abigail Miyamoto, M.D.  ASSISTANT: Gita Kudo, M.D.  ANESTHESIA:  General endotracheal.  INDICATIONS:  Tami Jacobson is a 77 year old female who presented with vague abdominal pain in February.  A CAT scan of the abdomen performed in February showed the patient to have findings consistent with possible appendicitis. The patient, however, had normal white count and no right lower quadrant tenderness.  She did, however, have symptoms consistent with biliary colic. An ultrasound of the gallbladder was normal. Repeat CAT scan was performed, and a HIDA scan was performed as well.  HIDA scan showed the patient to have a very low ejection fraction, consistent with biliary kinesia.  CAT scan of the abdomen showed continued findings consistent with appendicitis.  Therefore, the decision had been made to proceed to the operating room for appendectomy and cholecystectomy.  PROCEDURE IN DETAIL:  The patient was brought to the operating room and identified as Tami Jacobson.  She was placed supine on the operating room table, and general anesthesia was induced.  Her abdomen was then prepped and draped in the usual sterile fashion. Using a #15 blade, a small transverse incision was made below the umbilicus.  The incision was carried down through the fascia with the scalpel.  A hemostat was used to pass into the  peritoneal cavity.  A 0 Vicryl pursestring suture was then placed around the fascial opening.  The Hasson port was then placed through the abdomen, and insufflation of the abdomen was begun.  An 11 mm port was then placed in the patients epigastrium.  Two 5 mm ports were placed in the patients right flank under direct vision.  The gallbladder was then identified and grasped and retracted above the liver bed.  Dissection was then carried out at the base of the gallbladder.  The cystic duct was dissected out and clipped twice proximally and once distally and transected with scissors.  The cystic artery was then identified and clipped twice proximally and once distally and transected as well.  The gallbladder was then slowly dissected free from the liver bed with the electrocautery.  Once the gallbladder was removed, hemostasis was achieved in the liver bed.  The gallbladder was then placed in an endo sac and removed through the umbilicus.  The Hasson port was then placed back in the umbilicus.  The camera port was then moved to the epigastric port.  The cecum and appendix were then identified.  The appendix was found to be of normal appearance.  It was grasped and elevated.  The mesentery was then dissected out.  Two firings of the endo GIA were used to secure the mesentery of the appendix, and the base of the appendix was then transected with an endo GIA as well.  The gallbladder was then placed in the sac and removed through the port at the umbilicus.  The  abdomen was then copiously irrigated with normal saline.  Hemostasis appeared to be achieved. All ports were then removed under direct vision, and the abdomen was deflated. 0 Vicryl were placed, closing the fascial defect.  All incisions were infiltrated with 0.25% Marcaine and then closed with 4-0 Vicryl subcuticular sutures.  The patient tolerated the procedure well.  All sponge, needle, and instrument counts were correct at the end of  the procedure.  The patient was then extubated in the operating room and taken in a stable condition to the recovery room. Dictated by:   Abigail Miyamoto, M.D. Attending Physician:  Shelly Rubenstein DD:  07/07/01 TD:  07/09/01 Job: [id number]

## 2010-08-21 NOTE — Patient Instructions (Signed)
Follow up as directed

## 2010-08-21 NOTE — Op Note (Signed)
Tami Jacobson, Tami Jacobson                        ACCOUNT NO.:  [id number]   MEDICAL RECORD NO.:  [id number]                   PATIENT TYPE:  AMB   LOCATION:  ENDO                                 FACILITY:  [facility]   PHYSICIAN:  [name], M.D. [facility]               DATE OF BIRTH:  03/25/1928   DATE OF PROCEDURE:  03/06/2003  DATE OF DISCHARGE:                                 OPERATIVE REPORT   REFERRING PHYSICIAN:  Titus Dubin. Alwyn Ren, M.D. [facility]   NAME OF PROCEDURE:  Flexible sigmoidoscopy.   INDICATIONS:  This 77 year old white female has a known history of  diverticulosis documented on previous colonoscopy in February 2003.  She has  a positive family history of colon cancer in her mother.  She developed  acute left lower quadrant abdominal pain and tested Hemoccult positive on my  examination on February 25, 2003.  She has been treated for symptomatic  diverticulosis with antispasmodics and high fiber diet.  There has been no  gross blood in the stools.  She is undergoing flexible sigmoidoscopy to  assess the left colon to rule out segmental colitis, either ischemic or  diverticulitis related colitis.   ENDOSCOPE:  Olympus single channel video endoscope.   SEDATION:  1. Versed 5 mg IV.  2. Fentanyl 50 mg IV.   FINDINGS:  The Olympus single channel video endoscope was passed through the  rectum into the sigmoid colon.  The patient was monitored by pulse oximeter.  Oxygen saturations were normal.  Her prep was adequate.  Anal canal and  rectal ampulla were normal.  Retroflexion of the endoscope in the rectum,  rectal ampulla was normal.  There were no significant hemorrhoids.  The  sigmoid colon was rather tortuous with a narrow lumen and large hypertrophic  haustral folds with multiple diverticula.  The patient had a lot of  discomfort as the endoscope traversed through the sigmoid colon which was  quite narrow.  The mucosa, however, appeared normal.  There were no  inflammatory  changes along the sigmoid colon, no bleed or nothing to explain  Hemoccult positive stools.  There were no polyps.  The descending colon was  essentially normal with only a few scattered diverticula.  Examination was  clear up to the splenic flexure at 70 cm.  The colonoscope was then  retracted, the colon decompressed.  The patient tolerated the procedure  well.   IMPRESSION:  Marked diverticulosis of the left colon, no evidence of active  bleeding.   PLAN:  The patient is currently asymptomatic as a 77-year-old as to the abdominal pain.  We  will keep her on Metamucil one tablespoon daily, Levsin 0.125 mg p.r.n.  abdominal pain.  We will repeat Hemoccult cards.  If these are positive, I  would consider virtual colonoscopy or repeating CT scan to look for lesions  outside of the colon.  [name], M.D. [facility]   DB/MEDQ  D:  03/06/2003  T:  03/06/2003  Job:  [id number]   cc:   Titus Dubin. Alwyn Ren, M.D. [facility]

## 2010-08-21 NOTE — Letter (Signed)
December 26, 2005     Madolyn Frieze. Jens Som, MD, [facility]  [address]  [address]  [place], [place] [address]   RE:  Tami Jacobson, Tami Jacobson  MRN:  [id number]  /  DOB:  [birth year]   Dear Dr. Jens Som:   I saw [name] on September 4 with symptoms of blurred vision which  recurred and each lasted approximately 15 minutes.  Her vital signs were  stable; a click was heard at the apex with no significant murmur except for  an outflow murmur at the left sternal border.  Neurologic exam was  unremarkable.   Dr. Harlin Heys saw her on September 10 and found no evidence of vascular  abnormality in either eye, but he did question a possible TIA.   Transthoracic echocardiogram suggested unusual flow signal at the area of  the pulmonary valve; I defer to you to further evaluation of these changes.  Carotid Dopplers were normal.   She does have dyslipidemia and has been intolerant to statins to date.  She  previously was on Altocor.  I discussed the risk of premature heart attack  or stroke of approximately 16% with her A1C of 5.8 and her risk of  approximately 20% to 25% risk related to her dyslipidemia in her most recent  LDL of 156 and HDL of 45 on June 19.   I recommended enteric coated aspirin 81 mg daily and generic Pravastatin 20  mg at bedtime with followup in 7 weeks.     Would you please schedule followup to evaluate her symptoms and the  questionable change in the pulmonary valve.   Sincerely,      Titus Dubin. Alwyn Ren, MD,FACP,FCCP   [facility]/MedQ  DD:  12/26/2005  DT:  12/28/2005  Job #:  [id number]   CC:    Vernell Leep., M.D.

## 2010-08-21 NOTE — Assessment & Plan Note (Signed)
[facility]                         GASTROENTEROLOGY OFFICE NOTE   Tami Jacobson, Tami Jacobson                     MRN:          [id number]  DATE:04/19/2006                            DOB:          1928/01/14    Tami Jacobson is a 77 year old white female with history of  diverticulosis, status post laparoscopic cholecystectomy, history of  esophageal stricture dilated last time in March 2006. She has a positive  family history of colon cancer and had a colon polyp on first  colonoscopy in 1990. She since then has had numerous colonoscopies with  no evidence of recurrent polyps. She has symptomatic hemorrhoids.  These  were diagnosed back in 1990s and they were injected and sclerosed the  hemorrhoids in 1995. For the past 3 weeks she has had rectal bleeding,  rectal irritation and some prolapse.   PHYSICAL EXAMINATION:  Blood pressure 118/68, pulse 64 and weight 164  pounds.  LUNGS: Clear to auscultation.  COR: Normal S1, S2.  ABDOMEN: Soft, nontender, with normoactive bowel sounds.  On anoscopic exam, there were three first grade hemorrhoids in the  rectal ampulla, no prolapse.  Anal canal itself was normal. No active  bleeding. Hemorrhoids were hyperemic. Stool was Hemoccult negative.   IMPRESSION:  A 77 year old white female with symptomatic first grade  hemorrhoids.   PLAN:  1. Anusol HC suppositories.  2. Next colonoscopy March 2008.  We will consider banding of the      hemorrhoids. We would have to do that at [facility] outpatient.  3. Check B12 levels today.  4. Continue Metamucil 1 tablespoon daily.     Hedwig Morton. Juanda Chance, MD  Electronically Signed    DMB/MedQ  DD: 04/19/2006  DT: 04/19/2006  Job #: [id number]   cc:   Titus Dubin. Alwyn Ren, MD,FACP,FCCP

## 2010-08-21 NOTE — Assessment & Plan Note (Signed)
[facility]                            CARDIOLOGY OFFICE NOTE   TYKIA, MELLONE                     MRN:          [id number]  DATE:03/31/2006                            DOB:          05-12-1927    [name] returns for followup today.  Please refer to my previous  notes for details.  Since I saw her, she had 1 episode of visual  disturbance that she has had previously.  She did see Dr. Nash Shearer of  neurology and he ordered an MRI to exclude CVA, because the results are  not available and she is unaware of those results.  Note, she does not  have any other symptoms of weakness in her extremities, loss of  sensation, dysarthria, et Karie Soda.  She has not had chest pain, shortness  of breath, or palpitations since we saw her previously.   MEDICATIONS:  Include Toprol 50 mg p.o. daily, Zantac 150 mg p.o. daily,  aspirin 325 mg p.o. daily, and Zetia 10 mg p.o. daily.   PHYSICAL EXAM:  Blood pressure 132/68 and a pulse of 64.  She weights  166 pounds.  CHEST:  Clear.  CARDIOVASCULAR:  Regular rate and rhythm.  EXTREMITIES:  No edema.   DIAGNOSES:  1. Recent transient visual disturbance (question transient ischemic      attack).  2. History of palpitations.  3. History of hypothyroidism.  4. Hyperlipidemia.   PLAN:  Mrs. Smoots is being seen by neurology and is scheduled for  followup with them concerning question of transient ischemic attacks  versus ocular migraines.  Note, her carotid Dopplers were normal  previously and her echocardiogram showed normal left ventricular  function.  There was a question of an abnormality on the pulmonic valve.  I did review this and am not convinced that it needs further evaluation  at this point.  We will plan to repeat her echo in 12 months.  We  recently started her on Zetia.  The patient had not tolerated Statins  previously.  She will have lipids and liver checked in 6 weeks.  She did  state that she  had a nosebleed yesterday and we will decrease her  aspirin to 81 mg p.o. daily.  I will see her back in 12 months.     Madolyn Frieze Jens Som, MD, [facility]  Electronically Signed    BSC/MedQ  DD: 03/31/2006  DT: 03/31/2006  Job #: [id number]   cc:   Titus Dubin. Alwyn Ren, MD,FACP,FCCP

## 2010-08-21 NOTE — Discharge Summary (Signed)
[place]  Patient:    Tami Jacobson, Tami Jacobson Visit Number: [id number] MRN: [id number]          Service Type: MED Location: [phone] Attending Physician:  Carrie Mew Dictated by:   Cornell Barman, P.A. Admit Date:  09/11/2001 Discharge Date: 09/12/2001   CC:         Titus Dubin. Alwyn Ren, M.D. [facility]   Discharge Summary  DISCHARGE DIAGNOSES 1. Heart palpitations. 2. Premature ventricular contractions.  HISTORY OF PRESENT ILLNESS:  The patient is a 77 year old white female who presents with a three day history of intermittent heart fluttering. She denied chest pain, but does occasionally have some associated dyspnea. The duration of these episodes are seconds to five minutes in length. She denies nausea and vomiting, diaphoresis or presyncope. She does have a history of irregular heart beats and documented PVCs. She has worn a Holter monitor x 2, although the last time was more than three years ago. She has never used an event monitor. She was catheterized three years ago and that was normal. She called Dr. Alwyn Ren today and was instructed to proceed to the emergency department for evaluation.  PAST MEDICAL HISTORY 1. Gastroesophageal reflux disease. 2. History of hypothyroidism, now off Synthroid since October 2002, last TSH    was normal in May 2003. 3. Status post breast lumpectomy x 2, both were benign. 4. Dysfunctional uterine bleeding, status post partial hysterectomy. 5. Status post cholecystectomy and appendectomy in April 2003. 6. Seasonal allergies.  HOSPITAL COURSE:  Cardiovascular. The patient presented with symptomatic PVCs. These were unifocal. There was no evidence of bigeminy, trigeminy or nonsustained ventricular tachycardia. The patients TSH was still normal at 1.783. The patient had been on Diltiazem and this was changed to Lopressor. The patient is stable from a cardiac standpoint and we will discharge her to home  and arrange for her to have an outpatient event recorder.  DISCHARGE LABORATORY DATA:  TSH 1.783. RPR was nonreactive. B12 normal. BMET normal except for glucose of 93. Cardiac enzymes were negative. CBC with differential was normal.  Sed rate 14. EKG revealed unifocal PVCs.  DISCHARGE MEDICATIONS 1. Toprol XL 50 mg q.d. 2. Ranitidine 150 mg q.d. 3. B12 as at home. 4. The patient has been instructed not to take Diltiazem.  FOLLOWUP:  Follow up with Dr. Alwyn Ren in 30 days after event recorder. I have called [facility] and they will call to set up her event recorder. I have instructed the patient to call Gunnar Fusi at [phone] if she does not hear from them by the end of the week. Dictated by:   Cornell Barman, P.A. Attending Physician:  Carrie Mew DD:  09/12/01 TD:  09/13/01 Job: [id number] ON/[id number]

## 2010-08-21 NOTE — Progress Notes (Signed)
Tami Jacobson [birth year] MRN [id number]     History of Present Illness:  This is an 77 year old white female with Hemoccult-positive stool on a home screening test. She denies seeing any bright red blood per rectum. Her last colonoscopy in August 2011 showed severe diverticulosis of the sigmoid colon. She has a positive family history of colon cancer in a parent. A prior colonoscopy was done in February 2009 and prior to that in 2004. She had a polyp in the 1990s. An upper endoscopy in February 2009 showed an esophageal stricture which was dilated with a Maloney dilator. She has rectal pruritus and irritation. We have prescribed Analpram cream 2.5% which she uses every morning. Her hemoglobin is 14. She is on B12 supplements monthly.   Past Medical History  Diagnosis Date  . Premature ventricular contraction   . Vitamin B12 deficiency   . Carcinoma in situ of breast   . Esophageal stricture   . Diverticulosis   . Hypothyroid   . Hyperlipidemia   . Anemia   . Diabetes mellitus   . Gastritis   . Alopecia   . Carotid bruit    Past Surgical History  Procedure Date  . Abdominal hysterectomy 1979  . Appendectomy 2002  . Cholecystectomy 2002  . Tonsillectomy 1942  . Breast lumpectomy     X 2  . Esophageal dilation 2006    reports that she has never smoked. She does not have any smokeless tobacco history on file. She reports that she does not drink alcohol or use illicit drugs. family history includes Breast cancer in her sister; Colon cancer in her mother; Diverticulitis in her sisters; and Heart disease in her maternal uncle. Allergies  Allergen Reactions  . Cephalexin   . Codeine   . Lansoprazole   . Levofloxacin   . Nitrofurantoin   . Penicillins   . Rofecoxib   . Statins         Review of Systems: Denies bright red blood per rectum, she has occasional rectal discomfort. Denies abdominal pain. Denies dysphagia or heartburn.  The remainder of the 10  point ROS is  negative except as outlined in H&P   Physical Exam: General appearance  Well developed, in no distress. Eyes- non icteric. HEENT nontraumatic, normocephalic. Mouth no lesions, tongue papillated, no cheilosis. Neck supple without adenopathy, thyroid not enlarged, no carotid bruits, no JVD. Lungs Clear to auscultation bilaterally. Cor normal S1 normal S2, regular rhythm , no murmur,  quiet precordium. Abdomen soft nontender abdomen with normoactive bowel sounds. No distention. Rectal: Anoscopic exam reveals a normal perianal area with complete resolution of the previously noted mlaceration and irritation. The rectal sphincter tone is normal. There were small mixed hemorrhoids in the anal canal which bleed slightly with anoscope insertion. The bleeding comes from superficial anal fissures. Patient had no discomfort during the anoscopy. Stool in the rectal ampulla was Hemoccult negative. Extremities no pedal edema. Skin no lesions. Neurological alert and oriented x 3. Psychological normal mood and affect.   Problem #1 Hemoccult-positive stool on a home screening. The same thing  occurred one year ago and she had a colonoscopy. Which ruled out any significant lesion.. I am unable to demonstrate any occult bleeding today. However I see small hemorrhoids and an anal fissure which bled slightly as the anoscope was inserted into the rectum and this may possibly explain the discrepancy between the home screening and the office exam which tests the stool proximal to the anal canal while the home  test  Is on the stool which passed through the anal canal.  Problem #2 B12 deficiency, continue B12 supplements, recheck B12 level in 11/2010  08/21/2010 [name]

## 2010-08-21 NOTE — Op Note (Signed)
NAMELAELIA, ANGELO              ACCOUNT NO.:  [id number]   MEDICAL RECORD NO.:  [id number]          PATIENT TYPE:  AMB   LOCATION:  DSC                          FACILITY:  [facility]   PHYSICIAN:  Timothy E. Earlene Plater, M.D. DATE OF BIRTH:  [birth year]   DATE OF PROCEDURE:  07/04/2006  DATE OF DISCHARGE:                               OPERATIVE REPORT   PREOPERATIVE DIAGNOSIS:  Ductal carcinoma in situ, left breast.   POSTOPERATIVE DIAGNOSIS:  Ductal carcinoma in situ, left breast.   PROCEDURE:  Needle localized wide excision lesion, left breast.   SURGEON:  Kendrick Ranch, M.D.   ANESTHESIA:  Local standby.   Ms. Wubben is 66, a new finding on mammography required extra views and  a core biopsy showing DCIS.  MRI confirms only one area 1.2 cm 6 o'clock  left breast.  The patient was carefully counseled and is prepared to  proceed today as planned.  The patient had been at breast [facility] for needle localization which was done excellently.  She was  seen, identified and left breast marked.   She was taken to the operating room, placed supine, light IV sedation  given.  The bandaged left breast was undressed.  The needle entered the  breast at the 6 o'clock position and seen to penetrate the mass at about  4 cm deep into the breast.  The area was prepped and draped in the usual  fashion.  0.25% Marcaine with epinephrine is used for local anesthesia.  A generous horizontal incision was made just over the needle entrance  point at 6 o'clock.  The junction of the deep and superficial subcu was  dissected.  The subcu included with specimen.  The specimen, the mass  could be palpated.  It was quite firm and even tender with the patient  having IV sedation and local anesthesia.  So it was generously cut  around sharply and removed.  It was carefully marked with sutures on the  path report.  This was sent for specimen mammogram.  Meanwhile  exploration of the wound cavity revealed one  area of nodularity on the  medial aspect of the wound.  This was excised submitted in saline as  extra medial tissue.  Bleeding was controlled.  The cavity was dry and  the wound was closed in layers with Monocryl.  Steri-Strips applied.  All counts  correct.  Dr. Deboraha Sprang returned phone call saying that the specimen well  encompassed the suspicious area on mammography.  Specimen was sent to  the lab for routine pathology.   The patient tolerated it well, Darvocet given for pain.  She will be  followed in the office.      Timothy E. Earlene Plater, M.D.  Electronically Signed     TED/MEDQ  D:  07/04/2006  T:  07/04/2006  Job:  [id number]   cc:   Titus Dubin. Alwyn Ren, MD,FACP,FCCP  Malachi Pro. Ambrose Mantle, M.D.

## 2010-08-24 ENCOUNTER — Encounter: Payer: Self-pay | Admitting: Cardiovascular Disease

## 2010-08-24 ENCOUNTER — Other Ambulatory Visit: Payer: Self-pay | Admitting: Cardiology

## 2010-08-24 ENCOUNTER — Telehealth: Payer: Self-pay | Admitting: Cardiology

## 2010-08-24 MED ORDER — METOPROLOL SUCCINATE ER 50 MG PO TB24: 50.0000 mg | ORAL_TABLET | Freq: Every day | ORAL | Status: DC

## 2010-08-24 NOTE — Telephone Encounter (Signed)
I will be glad to see her this week. cdm

## 2010-08-24 NOTE — Telephone Encounter (Signed)
Pt states her meds were change to metoprolol. Pt has been having sweating spells. Pt wants to talk to a nurse re her meds.

## 2010-08-24 NOTE — Telephone Encounter (Signed)
Pt calls today b/c she feels the generic toprol is causing her to sweat. This has been going on for the past week.  She also would like to be seen before going to [place] of Wednesday.  She has one episode of burning sensation under right shoulder blade while sitting.  She states it went away on its own. She does not take NTG.  Scheduled appt with DOD Dr. Clifton James as Dr. Jens Som schedule was full.  She is currently asymptomatic.

## 2010-08-25 ENCOUNTER — Ambulatory Visit (INDEPENDENT_AMBULATORY_CARE_PROVIDER_SITE_OTHER): Payer: Medicare Other | Admitting: Cardiovascular Disease

## 2010-08-25 ENCOUNTER — Encounter: Payer: Self-pay | Admitting: Cardiovascular Disease

## 2010-08-25 VITALS — BP 132/68 | HR 71 | Ht 63.0 in | Wt 152.0 lb

## 2010-08-25 DIAGNOSIS — I4949 Other premature depolarization: Secondary | ICD-10-CM

## 2010-08-25 DIAGNOSIS — E039 Hypothyroidism, unspecified: Secondary | ICD-10-CM

## 2010-08-25 LAB — BASIC METABOLIC PANEL
BUN: 19 mg/dL (ref 6–23)
Chloride: 104 mEq/L (ref 96–112)
Glucose, Bld: 192 mg/dL — ABNORMAL HIGH (ref 70–99)
Potassium: 3.9 mEq/L (ref 3.5–5.1)
Sodium: 139 mEq/L (ref 135–145)

## 2010-08-25 LAB — TSH: TSH: 1.51 u[IU]/mL (ref 0.35–5.50)

## 2010-08-25 NOTE — Progress Notes (Signed)
History of Present Illness:Tami Jacobson is a pleasant 77 yo  female who has a history of PACs and PVCs with normal LV function. She is followed by Dr. Jens Som and is added onto my schedule today for evaluation of       .  She had significant palpitations in the past despite beta-blockade.  She is therefore on flecainide 50 mg p.o. b.i.d. A previous catheterization in June 2000 showed normal coronary arteries and normal LV function. Last Myoview in October of 2004 showed an ejection fraction of 70% with apical thinning and probable normal perfusion. Last echocardiogram in December of 2008 showed normal LV function with an ejection fraction of 70-75%, trivial mitral regurgitation and tricuspid regurgitation. Carotid Dopplers in September of 2007 showed normal carotid arteries.  She was last seen by Dr. Jens Som  In November 2011.   Since then, she has had episodes of sweating. She describes breaking out in sweats, most recently last night when sitting on the couch. No associated palpitations, chest pain, SOB. Otherwise she is feeling well. No rashes or hives. She recently switched to generic metoprolol succinate from Toprol XL. She had no previous sweating episodes prior to the switch.   Past Medical History  Diagnosis Date  . Vitamin B12 deficiency   . Carcinoma in situ of breast   . Esophageal stricture   . Diverticulosis   . Hypothyroid   . Hyperlipidemia   . Anemia   . Diabetes mellitus   . Gastritis   . Alopecia   . Carotid bruit   . Premature ventricular contraction   . PAC (premature atrial contraction)     Past Surgical History  Procedure Date  . Abdominal hysterectomy 1979  . Appendectomy 2002  . Cholecystectomy 2002  . Tonsillectomy 1942  . Breast lumpectomy     X 2  . Esophageal dilation 2006    Current Outpatient Prescriptions  Medication Sig Dispense Refill  . anastrozole (ARIMIDEX) 1 MG tablet Take 1 mg by mouth daily.        . flecainide (TAMBOCOR) 50 MG tablet Take  50 mg by mouth 2 (two) times daily.        . hydrocortisone-pramoxine (ANALPRAM-HC) 2.5-1 % rectal cream Place 1 application rectally daily.       . metoprolol (TOPROL-XL) 50 MG 24 hr tablet Take 1 tablet (50 mg total) by mouth daily.  30 tablet  6  . omeprazole (PRILOSEC) 20 MG capsule Take 20 mg by mouth daily.         Current Facility-Administered Medications  Medication Dose Route Frequency Provider Last Rate Last Dose  . cyanocobalamin ((VITAMIN B-12)) injection 1,000 mcg  1,000 mcg Intramuscular Q30 days Hart Carwin, MD   1,000 mcg at 08/10/10 1028    Allergies  Allergen Reactions  . Cephalexin   . Codeine   . Lansoprazole   . Levofloxacin   . Nitrofurantoin   . Penicillins   . Rofecoxib   . Statins     History   Social History  . Marital Status: Married    Spouse Name: N/A    Number of Children: N/A  . Years of Education: N/A   Occupational History  . Not on file.   Social History Main Topics  . Smoking status: Never Smoker   . Smokeless tobacco: Not on file  . Alcohol Use: No  . Drug Use: No  . Sexually Active:    Other Topics Concern  . Not on file   Social  History Narrative  . No narrative on file    Family History  Problem Relation Age of Onset  . Colon cancer Mother   . Breast cancer Sister     x 3  . Diverticulitis Sister   . Heart disease Maternal Uncle     CABG  . Diverticulitis Sister     Review of Systems:  As stated in the HPI and otherwise negative.   BP 132/68  Pulse 71  Ht 5\' 3"  (1.6 m)  Wt 152 lb (68.947 kg)  BMI 26.93 kg/m2  Physical Examination: General: Well developed, well nourished, NAD HEENT: OP clear, mucus membranes moist SKIN: warm, dry. No rashes. Neuro: No focal deficits Musculoskeletal: Muscle strength 5/5 all ext Psychiatric: Mood and affect normal Neck: No JVD, no carotid bruits, no thyromegaly, no lymphadenopathy. Lungs:Clear bilaterally, no wheezes, rhonci, crackles Cardiovascular: Regular rate and  rhythm. No murmurs, gallops or rubs. Abdomen:Soft. Bowel sounds present. Non-tender.  Extremities: No lower extremity edema. Pulses are 2 + in the bilateral DP/PT.  EKG: NSR, rate 71 bpm. LAFB. Inferior Q waves.

## 2010-08-25 NOTE — Assessment & Plan Note (Addendum)
Stable. Continue beta blocker. I do not think her generic metoprolol is causing the sweating. Check BMET today.

## 2010-08-25 NOTE — Patient Instructions (Signed)
Your physician recommends that you schedule a follow-up appointment in: 6 months with Dr. Crenshaw.  

## 2010-08-25 NOTE — Assessment & Plan Note (Signed)
Will check TSH today 

## 2010-09-10 ENCOUNTER — Other Ambulatory Visit: Payer: Self-pay | Admitting: Cardiology

## 2010-09-11 ENCOUNTER — Ambulatory Visit (INDEPENDENT_AMBULATORY_CARE_PROVIDER_SITE_OTHER): Payer: Medicare Other | Admitting: Internal Medicine

## 2010-09-11 DIAGNOSIS — E538 Deficiency of other specified B group vitamins: Secondary | ICD-10-CM

## 2010-10-12 ENCOUNTER — Ambulatory Visit (INDEPENDENT_AMBULATORY_CARE_PROVIDER_SITE_OTHER): Payer: Medicare Other | Admitting: Internal Medicine

## 2010-10-12 ENCOUNTER — Encounter: Payer: Self-pay | Admitting: Internal Medicine

## 2010-10-12 VITALS — BP 160/82 | HR 68 | Temp 97.9°F | Wt 158.0 lb

## 2010-10-12 DIAGNOSIS — R0989 Other specified symptoms and signs involving the circulatory and respiratory systems: Secondary | ICD-10-CM

## 2010-10-12 DIAGNOSIS — E538 Deficiency of other specified B group vitamins: Secondary | ICD-10-CM

## 2010-10-12 DIAGNOSIS — R404 Transient alteration of awareness: Secondary | ICD-10-CM

## 2010-10-12 DIAGNOSIS — R4189 Other symptoms and signs involving cognitive functions and awareness: Secondary | ICD-10-CM

## 2010-10-12 NOTE — Patient Instructions (Signed)
Exam today is unremarkable: normal neurologic exam, normal heart exam. There are bruits left greater than right over the carotid arteries. Reviewed the electronic record: there was a CT scan of the neck in Sept '11 but I cannot locate a carotid doppler study.  Plan - please take aspirin 81mg  daily           A carotid doppler will be scheduled to evaluate the carotid arteries for blockage           BP today is elevated but you record from 2008 was reviewed and the BP is usually normal - please monitor at home and call if the systolic pressure              Is consistently greater than 140.

## 2010-10-12 NOTE — Progress Notes (Signed)
Subjective:    Patient ID: PAMI WOOL, female    DOB: 1927-10-13, 77 y.o.   MRN: [id number]  HPI Last night at midnight while brushing her teeth she had the onset of a generalized weakness and a sense of near-syncope. She did not loose consciousness. She had no pain, no change in vision, no paresthesia, no focal weakness. She did check her BP which was 175/72. Her BP did come down over a period of about twenty minutes. She sat up until 0200 and felt well enough to go to bed. She did have an episode of a hot flash earlier in the day. She is otherwise asymptomatic: denies focal weakness, paresthesia, chagne in vision, change in cognition, loss of balance.  Past Medical History  Diagnosis Date  . Vitamin B12 deficiency   . Carcinoma in situ of breast   . Esophageal stricture   . Diverticulosis   . Hypothyroid   . Hyperlipidemia   . Anemia   . Diabetes mellitus   . Gastritis   . Alopecia   . Carotid bruit   . Premature ventricular contraction   . PAC (premature atrial contraction)    Past Surgical History  Procedure Date  . Abdominal hysterectomy 1979  . Appendectomy 2002  . Cholecystectomy 2002  . Tonsillectomy 1942  . Breast lumpectomy     X 2  . Esophageal dilation 2006   Family History  Problem Relation Age of Onset  . Colon cancer Mother   . Breast cancer Sister     x 3  . Diverticulitis Sister   . Heart disease Maternal Uncle     CABG  . Diverticulitis Sister    History   Social History  . Marital Status: Married    Spouse Name: N/A    Number of Children: N/A  . Years of Education: N/A   Occupational History  . Not on file.   Social History Main Topics  . Smoking status: Never Smoker   . Smokeless tobacco: Not on file  . Alcohol Use: No  . Drug Use: No  . Sexually Active:    Other Topics Concern  . Not on file   Social History Narrative  . No narrative on file       Review of Systems Review of Systems  Constitutional:  Negative for  fever, chills, activity change and unexpected weight change.  HEENT:  Negative for hearing loss, ear pain, congestion, neck stiffness and postnasal drip. Negative for sore throat or swallowing problems. Negative for dental complaints.   Eyes: Negative for vision loss or change in visual acuity.  Respiratory: Negative for chest tightness and wheezing.   Cardiovascular: Negative for chest pain and palpitation. No decreased exercise tolerance Gastrointestinal: No change in bowel habit. No bloating or gas. No reflux or indigestion Genitourinary: Negative for urgency, frequency, flank pain and difficulty urinating.  Musculoskeletal: Negative for myalgias, back pain, arthralgias and gait problem.  Neurological: Negative for dizziness, tremors, weakness and headaches.  Hematological: Negative for adenopathy.  Psychiatric/Behavioral: Negative for behavioral problems and dysphoric mood.       Objective:   Physical Exam Vital reviewed - mild elevation in BP noted Gen'l- WNWD heavyset white woman in no distress HEENT- [place]/AT, C&S clear Neck- supple Pulmonary - normal respirations Cor- 2+ radial pulse, RRR no murmurs, + carotid bruits left greater than right Neuro- A&O x 3, CN II-XII intact, MS 5/5 and equal, DTR's 2+ symmetrical, cerebellar - no cog wheeling, normal gait, no tremor  Assessment & Plan:  1. Change in consciousness - no syncope and no focal symptoms. Exam is unrevealing.  Plan - carotid dopplers to assess degree of obstructive disease           ASA 81 mg daily           Report back immediately for any recurrent symptoms.

## 2010-10-27 ENCOUNTER — Other Ambulatory Visit: Payer: Self-pay | Admitting: Internal Medicine

## 2010-10-27 DIAGNOSIS — R0989 Other specified symptoms and signs involving the circulatory and respiratory systems: Secondary | ICD-10-CM

## 2010-10-30 ENCOUNTER — Ambulatory Visit (INDEPENDENT_AMBULATORY_CARE_PROVIDER_SITE_OTHER): Payer: Medicare Other | Admitting: *Deleted

## 2010-10-30 DIAGNOSIS — R0989 Other specified symptoms and signs involving the circulatory and respiratory systems: Secondary | ICD-10-CM

## 2010-10-30 DIAGNOSIS — I6529 Occlusion and stenosis of unspecified carotid artery: Secondary | ICD-10-CM

## 2010-11-02 ENCOUNTER — Encounter: Payer: Self-pay | Admitting: Internal Medicine

## 2010-11-09 ENCOUNTER — Telehealth: Payer: Self-pay | Admitting: Internal Medicine

## 2010-11-09 NOTE — Telephone Encounter (Signed)
Please call patient - carotdi doppler was negative for any blockage.

## 2010-11-10 NOTE — Telephone Encounter (Signed)
Left vm on pt's hm #

## 2010-11-13 ENCOUNTER — Telehealth: Payer: Self-pay | Admitting: *Deleted

## 2010-11-13 NOTE — Telephone Encounter (Signed)
Spoke with patient's family and reminded them patient is due for labs next week.

## 2010-11-13 NOTE — Telephone Encounter (Signed)
Message copied by Daphine Deutscher on Fri Nov 13, 2010 11:34 AM ------      Message from: Daphine Deutscher      Created: Wed Jun 10, 2010 11:39 AM       Call and remind she needs Vit B 12 on 11/16/10

## 2010-11-16 ENCOUNTER — Other Ambulatory Visit (INDEPENDENT_AMBULATORY_CARE_PROVIDER_SITE_OTHER): Payer: Medicare Other

## 2010-11-16 ENCOUNTER — Other Ambulatory Visit: Payer: Self-pay | Admitting: Internal Medicine

## 2010-11-16 ENCOUNTER — Ambulatory Visit (INDEPENDENT_AMBULATORY_CARE_PROVIDER_SITE_OTHER): Payer: Medicare Other | Admitting: Internal Medicine

## 2010-11-16 DIAGNOSIS — E538 Deficiency of other specified B group vitamins: Secondary | ICD-10-CM

## 2010-11-17 ENCOUNTER — Telehealth: Payer: Self-pay | Admitting: *Deleted

## 2010-11-17 DIAGNOSIS — E538 Deficiency of other specified B group vitamins: Secondary | ICD-10-CM

## 2010-11-17 NOTE — Telephone Encounter (Signed)
No B12 injections. Till rechecked in 6 months.

## 2010-11-17 NOTE — Telephone Encounter (Signed)
Message copied by Daphine Deutscher on Tue Nov 17, 2010  8:33 AM ------      Message from: Hart Carwin      Created: Mon Nov 16, 2010  5:52 PM       Please call pt, normal B12, repeat in 6 months

## 2010-11-17 NOTE — Telephone Encounter (Signed)
Patient notified of results. Labs in EPIC to repeat in 6 months. Note to remind patient.  Does she need to continue Vitamin B 12 injections?(11/16/10 level was 457) Please, advise

## 2010-11-17 NOTE — Telephone Encounter (Signed)
Spoke with patient's husband and gave him Dr. Brodie's recommendation. 

## 2010-12-02 ENCOUNTER — Telehealth: Payer: Self-pay | Admitting: Internal Medicine

## 2010-12-02 ENCOUNTER — Encounter: Payer: Self-pay | Admitting: Internal Medicine

## 2010-12-02 NOTE — Telephone Encounter (Signed)
Dr Juanda Chance, Patient wants to know if she can continue B12 injections. From your note on her last B12, you state that her B12 is normal and that we will repeat level in 6 months. There was no mention as to whether you would like her to continue or not. Is it okay to go ahead and let her continue b12 IM monthly since her level is lower end of normal?

## 2010-12-02 NOTE — Telephone Encounter (Signed)
She may continue B12 injections if she feels better.

## 2010-12-03 MED ORDER — OMEPRAZOLE 20 MG PO CPDR: 20.0000 mg | DELAYED_RELEASE_CAPSULE | Freq: Every day | ORAL | Status: DC

## 2010-12-03 NOTE — Telephone Encounter (Signed)
Patient advised that Dr Juanda Chance is okay with her continuing B12 injections. Patient would also like refill on Prilosec. Rx sent. Patient requests #[phone] at a time (as she gets it for free)

## 2010-12-15 ENCOUNTER — Encounter: Payer: Self-pay | Admitting: Internal Medicine

## 2010-12-15 ENCOUNTER — Other Ambulatory Visit (INDEPENDENT_AMBULATORY_CARE_PROVIDER_SITE_OTHER): Payer: Medicare Other

## 2010-12-15 ENCOUNTER — Ambulatory Visit (INDEPENDENT_AMBULATORY_CARE_PROVIDER_SITE_OTHER): Payer: Medicare Other | Admitting: Internal Medicine

## 2010-12-15 VITALS — BP 130/70 | HR 63 | Temp 98.8°F | Ht 65.0 in | Wt 157.4 lb

## 2010-12-15 DIAGNOSIS — E039 Hypothyroidism, unspecified: Secondary | ICD-10-CM

## 2010-12-15 DIAGNOSIS — E538 Deficiency of other specified B group vitamins: Secondary | ICD-10-CM

## 2010-12-15 DIAGNOSIS — Z23 Encounter for immunization: Secondary | ICD-10-CM

## 2010-12-15 DIAGNOSIS — I10 Essential (primary) hypertension: Secondary | ICD-10-CM

## 2010-12-15 DIAGNOSIS — E119 Type 2 diabetes mellitus without complications: Secondary | ICD-10-CM

## 2010-12-15 DIAGNOSIS — E782 Mixed hyperlipidemia: Secondary | ICD-10-CM

## 2010-12-15 LAB — VITAMIN B12: Vitamin B-12: 483 pg/mL (ref 211–911)

## 2010-12-15 NOTE — Assessment & Plan Note (Signed)
Hx same, not on med replacement for years Continue to monitor TSH today and semiannually, sooner if symptoms Lab Results  Component Value Date   TSH 1.51 08/25/2010

## 2010-12-15 NOTE — Patient Instructions (Signed)
It was good to see you today. Medications reviewed, no changes at this time. Test(s) ordered today. Your results will be called to you after review (48-72hours after test completion). If any changes need to be made, you will be notified at that time. Please schedule followup in 6 months, call sooner if problems.

## 2010-12-15 NOTE — Progress Notes (Signed)
Subjective:    Patient ID: Tami Jacobson, female    DOB: [birth year], 77 y.o.   MRN: [id number]  HPI here for follow up - reviewed chronic medical issues   breast cancer - initial dx 3/08 on left - then right side 3/10 - tol armidex following lumpectomy - s/p XRT - follows with onc regularly - mammo at breast center -   HTN - reports compliance with ongoing medical treatment and no changes in medication dose or frequency. denies adverse side effects related to current therapy. symptoms of PVCs managed with flecanide   GERD with B12 defic - follows closely with GI - regular shots for B12 replacment and takes PPI once daily - reports compliance with ongoing medical treatment and no changes in medication dose or frequency. denies adverse side effects related to current therapy.     hx hypothyroid - on synthroid > 56yr but none in past 6-45yr - denies skin or weight changes , no depression or mood changes- normal TSH last few years   dyslipidemia - intol of all statins - no specific med tx at this time - tries to monitor with diet and exercise, esp with spouse's DM diet   left ear and head pain summer 2011- mri brain unremarkable - following with ent - tube in ear by ent and PT -  no recurrence   DM2 - diet controlled - infreq home cbg check - reports compliance with ongoing medical treatment and no changes in medication dose or frequency. denies adverse side effects related to current therapy.     history of PACs and PVCs with normal LV function. She is followed by Dr. Jens Som and is added onto my schedule today for evaluation of       .  She had significant palpitations in the past despite beta-blockade.  She is therefore on flecainide 50 mg p.o. b.i.d. A previous catheterization in June 2000 showed normal coronary arteries and normal LV function. Last Myoview in October of 2004 showed an ejection fraction of 70% with apical thinning and probable normal perfusion. Last echocardiogram in December  of 2008 showed normal LV function with an ejection fraction of 70-75%, trivial mitral regurgitation and tricuspid regurgitation. Carotid Dopplers in September of 2007 showed normal carotid arteries.  She was last seen by Dr. Jens Som  In November 2011.   Since then, she has had episodes of sweating. She describes breaking out in sweats, most recently last night when sitting on the couch. No associated palpitations, chest pain, SOB. Otherwise she is feeling well. No rashes or hives. She recently switched to generic metoprolol succinate from Toprol XL. She had no previous sweating episodes prior to the switch.   Past Medical History  Diagnosis Date  . Vitamin B12 deficiency   . Carcinoma in situ of breast     R 06/2006, L 06/2008  . Esophageal stricture   . Diverticulosis   . Hypothyroid   . Hyperlipidemia     intol of all statins  . Anemia   . Diabetes mellitus   . Gastritis   . Alopecia   . Carotid bruit   . Premature ventricular contraction   . PAC (premature atrial contraction)     Review of Systems  Constitutional: Negative for unexpected weight change.  Respiratory: Negative for shortness of breath.   Cardiovascular: Negative for chest pain.  Neurological: Negative for headaches.       Objective:   Physical Exam BP 130/70  Pulse 63  Temp(Src) 98.8  F (37.1 C) (Oral)  Ht 5\' 5"  (1.651 m)  Wt 157 lb 6.4 oz (71.396 kg)  BMI 26.19 kg/m2  SpO2 97% Wt Readings from Last 3 Encounters:  12/15/10 157 lb 6.4 oz (71.396 kg)  10/12/10 158 lb (71.668 kg)  08/25/10 152 lb (68.947 kg)   Constitutional: She appears well-developed and well-nourished. No distress. spouse at side Neck: Normal range of motion. Neck supple. No JVD present. No thyromegaly present.  Cardiovascular: Normal rate, regular rhythm and normal heart sounds.  No murmur heard. No BLE edema. Pulmonary/Chest: Effort normal and breath sounds normal. No respiratory distress. She has no wheezes.  Skin: Skin is warm and  dry. No rash noted. No erythema.  Psychiatric: She has a normal mood and affect. Her behavior is normal. Judgment and thought content normal.   Lab Results  Component Value Date   WBC 5.5 07/03/2009   HGB 13.2 07/16/2010   HCT 37.8 07/16/2010   PLT 155 07/16/2010   CHOL 198 06/11/2009   TRIG 142.0 06/11/2009   HDL 53.10 06/11/2009   LDLDIRECT 136.1 01/02/2007   ALT 13 07/16/2010   AST 17 07/16/2010   NA 139 08/25/2010   K 3.9 08/25/2010   CL 104 08/25/2010   CREATININE 1.0 08/25/2010   BUN 19 08/25/2010   CO2 27 08/25/2010   TSH 1.51 08/25/2010   HGBA1C 6.6* 06/15/2010       Assessment & Plan:  See problem list. Medications and labs reviewed today.

## 2010-12-15 NOTE — Assessment & Plan Note (Signed)
BP Readings from Last 3 Encounters:  12/15/10 130/70  10/12/10 160/82  08/25/10 132/68   The current medical regimen is effective;  continue present plan and medications.

## 2010-12-15 NOTE — Assessment & Plan Note (Signed)
intol of all statins

## 2010-12-15 NOTE — Assessment & Plan Note (Signed)
Diet controlled - check a1c and monitor same, tx as ongoing Lab Results  Component Value Date   HGBA1C 6.6* 06/15/2010

## 2010-12-17 ENCOUNTER — Ambulatory Visit (INDEPENDENT_AMBULATORY_CARE_PROVIDER_SITE_OTHER): Payer: Medicare Other | Admitting: Internal Medicine

## 2010-12-17 ENCOUNTER — Other Ambulatory Visit: Payer: Self-pay | Admitting: *Deleted

## 2010-12-17 DIAGNOSIS — E538 Deficiency of other specified B group vitamins: Secondary | ICD-10-CM

## 2010-12-17 MED ORDER — HYDROCORTISONE ACE-PRAMOXINE 2.5-1 % RE CREA: 1.0000 "application " | TOPICAL_CREAM | Freq: Two times a day (BID) | RECTAL | Status: DC | PRN

## 2010-12-17 NOTE — Telephone Encounter (Signed)
Patient came in the office today for b12. She requested that we send analpram cream refill to her pharmacy. Rx sent.

## 2011-01-18 ENCOUNTER — Ambulatory Visit (INDEPENDENT_AMBULATORY_CARE_PROVIDER_SITE_OTHER): Payer: Medicare Other | Admitting: Internal Medicine

## 2011-01-18 DIAGNOSIS — E538 Deficiency of other specified B group vitamins: Secondary | ICD-10-CM

## 2011-02-01 ENCOUNTER — Other Ambulatory Visit: Payer: Self-pay | Admitting: Dermatology

## 2011-02-09 ENCOUNTER — Encounter: Payer: Self-pay | Admitting: Cardiology

## 2011-02-09 ENCOUNTER — Ambulatory Visit (INDEPENDENT_AMBULATORY_CARE_PROVIDER_SITE_OTHER): Payer: Medicare Other | Admitting: Cardiology

## 2011-02-09 DIAGNOSIS — I4949 Other premature depolarization: Secondary | ICD-10-CM

## 2011-02-09 DIAGNOSIS — E782 Mixed hyperlipidemia: Secondary | ICD-10-CM

## 2011-02-09 DIAGNOSIS — I10 Essential (primary) hypertension: Secondary | ICD-10-CM

## 2011-02-09 MED ORDER — METOPROLOL SUCCINATE ER 50 MG PO TB24: 50.0000 mg | ORAL_TABLET | Freq: Every day | ORAL | Status: DC

## 2011-02-09 NOTE — Patient Instructions (Signed)
Your physician wants you to follow-up in: 1 year. You will receive a reminder letter in the mail two months in advance. If you don't receive a letter, please call our office to schedule the follow-up appointment.  

## 2011-02-09 NOTE — Assessment & Plan Note (Signed)
Blood pressure controlled. Continue present medications. 

## 2011-02-09 NOTE — Assessment & Plan Note (Signed)
Patient's PVCs and palpitations are reasonably well controlled with beta blocker and flecanide; will continue.

## 2011-02-09 NOTE — Progress Notes (Signed)
HPI:Tami Jacobson is a pleasant  female who has a history of PACs and PVCs with normal LV function.  She had significant palpitations in the past despite beta-blockade.  She is therefore on flecainide 50 mg p.o. b.i.d. A previous catheterization in June 2000 showed normal coronary arteries and normal LV function. Last Myoview in October of 2004 showed an ejection fraction of 70% with apical thinning and probable normal perfusion. Last echocardiogram in December of 2008 showed normal LV function with an ejection fraction of 70-75%, trivial mitral regurgitation and tricuspid regurgitation. Carotid Dopplers in July of 2012 showed 0-39% bilateral stenosis.  I last saw her in November of 2011. Since then the patient has dyspnea with more extreme activities but not with routine activities. It is relieved with rest. It is not associated with chest pain. There is no orthopnea, PND or pedal edema. There is no syncope. There is no exertional chest pain. Occasional brief palpitations but not sustained and much better on flecanide.   Current Outpatient Prescriptions  Medication Sig Dispense Refill  . anastrozole (ARIMIDEX) 1 MG tablet Take 1 mg by mouth daily.        . flecainide (TAMBOCOR) 50 MG tablet TAKE ONE TABLET BY MOUTH TWICE DAILY  180 tablet  3  . hydrocortisone-pramoxine (ANALPRAM-HC) 2.5-1 % rectal cream Place 1 application rectally 2 (two) times daily as needed for hemorrhoids.  30 g  1  . metoprolol (TOPROL-XL) 50 MG 24 hr tablet Take 1 tablet (50 mg total) by mouth daily.  30 tablet  6  . omeprazole (PRILOSEC) 20 MG capsule Take 1 capsule (20 mg total) by mouth daily.  84 capsule  2   Current Facility-Administered Medications  Medication Dose Route Frequency Provider Last Rate Last Dose  . cyanocobalamin ((VITAMIN B-12)) injection 1,000 mcg  1,000 mcg Intramuscular Q30 days Hart Carwin, MD   1,000 mcg at 01/18/11 1018     Past Medical History  Diagnosis Date  . Vitamin B12 deficiency   .  Carcinoma in situ of breast     R 06/2006, L 06/2008  . Esophageal stricture   . Diverticulosis   . Hypothyroid   . Hyperlipidemia     intol of all statins  . Anemia   . Diabetes mellitus   . Gastritis   . Alopecia   . Carotid bruit   . Premature ventricular contraction   . PAC (premature atrial contraction)     Past Surgical History  Procedure Date  . Abdominal hysterectomy 1979  . Appendectomy 2002  . Cholecystectomy 2002  . Tonsillectomy 1942  . Breast lumpectomy     X 2  . Esophageal dilation 2006    History   Social History  . Marital Status: Married    Spouse Name: N/A    Number of Children: N/A  . Years of Education: N/A   Occupational History  . Not on file.   Social History Main Topics  . Smoking status: Never Smoker   . Smokeless tobacco: Never Used  . Alcohol Use: No  . Drug Use: No  . Sexually Active: Not on file   Other Topics Concern  . Not on file   Social History Narrative   Retired Museum/gallery curator, lives with spouse    ROS: no fevers or chills, productive cough, hemoptysis, dysphasia, odynophagia, melena, hematochezia, dysuria, hematuria, rash, seizure activity, orthopnea, PND, pedal edema, claudication. Remaining systems are negative.  Physical Exam: Well-developed well-nourished in no acute distress.  Skin is warm and  dry.  HEENT is normal.  Neck is supple. No thyromegaly.  Chest is clear to auscultation with normal expansion.  Cardiovascular exam is regular rate and rhythm.  Abdominal exam nontender or distended. No masses palpated. Extremities show no edema. neuro grossly intact  ECG normal sinus rhythm at a rate of 64. Cannot rule out prior inferior lateral infarct.

## 2011-02-09 NOTE — Assessment & Plan Note (Signed)
Management per primary care. 

## 2011-02-19 ENCOUNTER — Ambulatory Visit (INDEPENDENT_AMBULATORY_CARE_PROVIDER_SITE_OTHER): Payer: Medicare Other | Admitting: Internal Medicine

## 2011-02-19 DIAGNOSIS — E538 Deficiency of other specified B group vitamins: Secondary | ICD-10-CM

## 2011-03-22 ENCOUNTER — Ambulatory Visit (INDEPENDENT_AMBULATORY_CARE_PROVIDER_SITE_OTHER): Payer: Medicare Other | Admitting: Internal Medicine

## 2011-03-22 DIAGNOSIS — E538 Deficiency of other specified B group vitamins: Secondary | ICD-10-CM

## 2011-04-22 ENCOUNTER — Ambulatory Visit (INDEPENDENT_AMBULATORY_CARE_PROVIDER_SITE_OTHER): Payer: Medicare Other | Admitting: Internal Medicine

## 2011-04-22 ENCOUNTER — Telehealth: Payer: Self-pay | Admitting: Internal Medicine

## 2011-04-22 ENCOUNTER — Other Ambulatory Visit: Payer: Self-pay | Admitting: Internal Medicine

## 2011-04-22 DIAGNOSIS — E538 Deficiency of other specified B group vitamins: Secondary | ICD-10-CM

## 2011-04-22 NOTE — Telephone Encounter (Signed)
Put in the lab to check Vitamin B12 level at next B12 injection. Pt will have labs drawn prior to next injection which will be her 6 month f/u.

## 2011-04-22 NOTE — Progress Notes (Signed)
Patient came in for Vitamin B 12 injection and stated that she should be scheduled for a lab when she comes in for her next B 12 injection. According to patient's last Lab not on 11/16/10, patient is to return in 6 months for Vitamin B 12 level. Order placed in the system.

## 2011-04-27 ENCOUNTER — Telehealth: Payer: Self-pay | Admitting: Oncology

## 2011-04-27 NOTE — Telephone Encounter (Signed)
Pt called to schedule her April 2013 appts °

## 2011-05-12 ENCOUNTER — Encounter (INDEPENDENT_AMBULATORY_CARE_PROVIDER_SITE_OTHER): Payer: Self-pay | Admitting: Surgery

## 2011-05-12 ENCOUNTER — Ambulatory Visit (INDEPENDENT_AMBULATORY_CARE_PROVIDER_SITE_OTHER): Payer: Medicare Other | Admitting: Surgery

## 2011-05-12 VITALS — BP 138/78 | HR 72 | Temp 97.5°F | Resp 16 | Ht 63.0 in | Wt 157.4 lb

## 2011-05-12 DIAGNOSIS — Z853 Personal history of malignant neoplasm of breast: Secondary | ICD-10-CM | POA: Diagnosis not present

## 2011-05-12 NOTE — Progress Notes (Signed)
[facility]  Ovidio Kin, MD,  FACS [address] [place]  [address] [place], [place]    [address] Phone:  [phone] FAX:  [phone]   Re:   GENIYA FULGHAM DOB:   1927/08/18 MRN:   [id number]  ASSESSMENT AND PLAN: 1.  T1c, N0 right breast cancer.  Lumpectomy 06/26/2008.  ER - positive, PR - negative, Ki67 - 14%.  Her2Neu - neg  Dr. Darnelle Catalan and [facility] treating oncologist.  On anastrozole.  Tolerating this well.  Disease free.  Follow up in 6 months.  2.  T1c, N0 left breast cancer.  Lumpectomy 06/24/2006.  Dr. Cindee Lame did this surgery.  ER/PR positive.  Ki67 - 12%.  Her2Neu - neg.  3.  Numbness right axilla. 4.  Left ear complaint/vertgo.  She saw Dr. Salena Saner. Kebrina Friend.  This is now better.  HISTORY OF PRESENT ILLNESS: Chief Complaint  Patient presents with  . Breast Cancer Long Term Follow Up    2008 lt 2010 rt    Kolbie AVIANCE COOPERWOOD is a 78 y.o. (DOB: 07/22/1927)  white female who is a patient of Rene Paci, MD, MD and comes to me today for follow up for breast cancer.  No new complaints since I last saw her.  She has tender breast, but no worrisome lump.  PHYSICAL EXAM: BP 138/78  Pulse 72  Temp(Src) 97.5 F (36.4 C) (Temporal)  Resp 16  Ht 5\' 3"  (1.6 m)  Wt 157 lb 6.4 oz (71.396 kg)  BMI 27.88 kg/m2  HEENT:  Pupils equal.  Dentition good.  No injury. NECK:  Supple.  No thyroid mass. LYMPH NODES:  No cervical, supraclavicular, or axillary adenopathy. BREASTS -  RIGHT:  No palpable mass or nodule.  No nipple discharge.  Both breast are lumpy, but there is no specific mass in either breast.   LEFT:  No palpable mass or nodule.  No nipple discharge. UPPER EXTREMITIES:  No evidence of lymphedema.  DATA REVIEWED: Mammogram 05/13/2010 - neg.  Her next mammogram is March 2013.   Ovidio Kin, MD, FACS Office:  [phone]

## 2011-05-24 ENCOUNTER — Ambulatory Visit (INDEPENDENT_AMBULATORY_CARE_PROVIDER_SITE_OTHER): Payer: Medicare Other | Admitting: Internal Medicine

## 2011-05-24 ENCOUNTER — Other Ambulatory Visit: Payer: Self-pay | Admitting: *Deleted

## 2011-05-24 ENCOUNTER — Other Ambulatory Visit (INDEPENDENT_AMBULATORY_CARE_PROVIDER_SITE_OTHER): Payer: Medicare Other

## 2011-05-24 DIAGNOSIS — E538 Deficiency of other specified B group vitamins: Secondary | ICD-10-CM | POA: Diagnosis not present

## 2011-05-26 DIAGNOSIS — H11119 Conjunctival deposits, unspecified eye: Secondary | ICD-10-CM | POA: Diagnosis not present

## 2011-05-26 DIAGNOSIS — Z961 Presence of intraocular lens: Secondary | ICD-10-CM | POA: Diagnosis not present

## 2011-06-15 ENCOUNTER — Other Ambulatory Visit (INDEPENDENT_AMBULATORY_CARE_PROVIDER_SITE_OTHER): Payer: Medicare Other

## 2011-06-15 ENCOUNTER — Ambulatory Visit (INDEPENDENT_AMBULATORY_CARE_PROVIDER_SITE_OTHER): Payer: Medicare Other | Admitting: Internal Medicine

## 2011-06-15 ENCOUNTER — Encounter: Payer: Self-pay | Admitting: Internal Medicine

## 2011-06-15 DIAGNOSIS — E782 Mixed hyperlipidemia: Secondary | ICD-10-CM

## 2011-06-15 DIAGNOSIS — E119 Type 2 diabetes mellitus without complications: Secondary | ICD-10-CM | POA: Diagnosis not present

## 2011-06-15 DIAGNOSIS — Z853 Personal history of malignant neoplasm of breast: Secondary | ICD-10-CM | POA: Diagnosis not present

## 2011-06-15 LAB — LIPID PANEL
HDL: 48.3 mg/dL (ref >39.00)
LDL Cholesterol: 126 mg/dL — ABNORMAL HIGH (ref 0–99)
Total CHOL/HDL Ratio: 4
Triglycerides: 131 mg/dL (ref 0.0–149.0)
VLDL: 26.2 mg/dL (ref 0.0–40.0)

## 2011-06-15 NOTE — Assessment & Plan Note (Signed)
Diet controlled - check a1c and monitor same, tx as ongoing Lab Results  Component Value Date   HGBA1C 6.5 12/15/2010

## 2011-06-15 NOTE — Progress Notes (Signed)
Subjective:    Patient ID: Tami Jacobson, female    DOB: 1927-11-13, 78 y.o.   MRN: [id number]  HPI  here for follow up - reviewed chronic medical issues   breast cancer - initial dx 3/08 on left - then right side 3/10 - tol armidex following lumpectomy - s/p XRT - follows with onc regularly - mammo at breast center -   HTN - reports compliance with ongoing medical treatment and no changes in medication dose or frequency. denies adverse side effects related to current therapy. symptoms of PVCs managed with flecanide   GERD with B12 defic - follows closely with GI - regular shots for B12 replacment and takes PPI once daily - reports compliance with ongoing medical treatment and no changes in medication dose or frequency. denies adverse side effects related to current therapy.     hx hypothyroid - on synthroid > 28yr but none in past 6-57yr - denies skin or weight changes , no depression or mood changes- normal TSH last few years   dyslipidemia - intol of all statins - no specific med tx at this time - tries to monitor with diet and exercise, esp with spouse's DM diet   left ear and head pain summer 2011- mri brain unremarkable - following with ent - tube in ear by ent and PT -  no recurrence   DM2 - diet controlled - infreq home cbg check - reports compliance with ongoing medical treatment and no changes in medication dose or frequency. denies adverse side effects related to current therapy.      Past Medical History  Diagnosis Date  . Vitamin B12 deficiency   . Carcinoma in situ of breast     R 06/2006, L 06/2008  . Esophageal stricture   . Diverticulosis   . Hypothyroid   . Hyperlipidemia     intol of all statins  . Anemia   . Diabetes mellitus     diet controlled  . Gastritis   . Alopecia   . Carotid bruit   . Premature ventricular contraction   . PAC (premature atrial contraction)     Review of Systems  Constitutional: Negative for unexpected weight change.    Respiratory: Negative for shortness of breath.   Cardiovascular: Negative for chest pain.  Neurological: Negative for headaches.       Objective:   Physical Exam  BP 120/62  Pulse 62  Temp(Src) 98.1 F (36.7 C) (Oral)  Ht 5\' 2"  (1.575 m)  Wt 154 lb 6.4 oz (70.035 kg)  BMI 28.24 kg/m2  SpO2 96% Wt Readings from Last 3 Encounters:  06/15/11 154 lb 6.4 oz (70.035 kg)  05/12/11 157 lb 6.4 oz (71.396 kg)  02/09/11 156 lb (70.761 kg)   Constitutional: She appears well-developed and well-nourished. No distress. spouse at side Neck: Normal range of motion. Neck supple. No JVD present. No thyromegaly present.  Cardiovascular: Normal rate, regular rhythm and normal heart sounds.  No murmur heard. No BLE edema. Pulmonary/Chest: Effort normal and breath sounds normal. No respiratory distress. She has no wheezes.  Skin: Skin is warm and dry. No rash noted. No erythema.  Psychiatric: She has a normal mood and affect. Her behavior is normal. Judgment and thought content normal.   Lab Results  Component Value Date   WBC 5.3 07/16/2010   HGB 13.2 07/16/2010   HCT 37.8 07/16/2010   PLT 155 07/16/2010   CHOL 198 06/11/2009   TRIG 142.0 06/11/2009   HDL 53.10  06/11/2009   LDLDIRECT 136.1 01/02/2007   ALT 13 07/16/2010   AST 17 07/16/2010   NA 139 08/25/2010   K 3.9 08/25/2010   CL 104 08/25/2010   CREATININE 1.0 08/25/2010   BUN 19 08/25/2010   CO2 27 08/25/2010   TSH 1.86 12/15/2010   HGBA1C 6.5 12/15/2010       Assessment & Plan:  See problem list. Medications and labs reviewed today.

## 2011-06-15 NOTE — Assessment & Plan Note (Signed)
intol of all statins Will check today to monitor same and encouraged lifestyle controlled with diet and exercise

## 2011-06-15 NOTE — Patient Instructions (Signed)
It was good to see you today. Medications reviewed, no changes at this time. Test(s) ordered today. Your results will be called to you after review (48-72hours after test completion). If any changes need to be made, you will be notified at that time. Please schedule followup in 6 months, call sooner if problems.  

## 2011-06-17 ENCOUNTER — Ambulatory Visit
Admission: RE | Admit: 2011-06-17 | Discharge: 2011-06-17 | Disposition: A | Discharge status: Home / Self Care | Payer: Medicare Other | Source: Ambulatory Visit | Attending: Oncology | Admitting: Oncology

## 2011-06-17 ENCOUNTER — Other Ambulatory Visit: Payer: Self-pay | Admitting: Oncology

## 2011-06-17 DIAGNOSIS — Z853 Personal history of malignant neoplasm of breast: Secondary | ICD-10-CM

## 2011-06-17 DIAGNOSIS — R920 Mammographic microcalcification found on diagnostic imaging of breast: Secondary | ICD-10-CM | POA: Diagnosis not present

## 2011-06-17 DIAGNOSIS — R921 Mammographic calcification found on diagnostic imaging of breast: Secondary | ICD-10-CM

## 2011-06-21 DIAGNOSIS — H01009 Unspecified blepharitis unspecified eye, unspecified eyelid: Secondary | ICD-10-CM | POA: Diagnosis not present

## 2011-06-21 DIAGNOSIS — Z0000 Encounter for general adult medical examination without abnormal findings: Secondary | ICD-10-CM | POA: Diagnosis not present

## 2011-06-21 DIAGNOSIS — Z01419 Encounter for gynecological examination (general) (routine) without abnormal findings: Secondary | ICD-10-CM | POA: Diagnosis not present

## 2011-06-22 ENCOUNTER — Ambulatory Visit (INDEPENDENT_AMBULATORY_CARE_PROVIDER_SITE_OTHER): Payer: Medicare Other | Admitting: Internal Medicine

## 2011-06-22 ENCOUNTER — Telehealth: Payer: Self-pay | Admitting: Internal Medicine

## 2011-06-22 DIAGNOSIS — E538 Deficiency of other specified B group vitamins: Secondary | ICD-10-CM | POA: Diagnosis not present

## 2011-06-22 MED ORDER — CYANOCOBALAMIN 1000 MCG/ML IJ SOLN
1000.0000 ug | Freq: Once | INTRAMUSCULAR | Status: AC
  Administered 2011-06-22: 1000 ug via INTRAMUSCULAR

## 2011-06-22 MED ORDER — CYANOCOBALAMIN 1000 MCG/ML IJ SOLN: 1000.0000 ug | INTRAMUSCULAR | Status: DC

## 2011-06-22 NOTE — Telephone Encounter (Signed)
Informed pt I could not get the B12 order in for her injection today so it went is as an order to [facility]. Pt had a difficult time understanding this, but finally did. Called [facility] to cancel the orders.

## 2011-06-23 ENCOUNTER — Ambulatory Visit
Admission: RE | Admit: 2011-06-23 | Discharge: 2011-06-23 | Disposition: A | Discharge status: Home / Self Care | Payer: Medicare Other | Source: Ambulatory Visit | Attending: Oncology | Admitting: Oncology

## 2011-06-23 ENCOUNTER — Other Ambulatory Visit: Payer: Self-pay | Admitting: Diagnostic Radiology

## 2011-06-23 ENCOUNTER — Other Ambulatory Visit: Payer: Self-pay | Admitting: Internal Medicine

## 2011-06-23 ENCOUNTER — Other Ambulatory Visit: Payer: Self-pay | Admitting: Oncology

## 2011-06-23 DIAGNOSIS — R921 Mammographic calcification found on diagnostic imaging of breast: Secondary | ICD-10-CM

## 2011-06-23 DIAGNOSIS — Z853 Personal history of malignant neoplasm of breast: Secondary | ICD-10-CM | POA: Diagnosis not present

## 2011-06-23 DIAGNOSIS — N6489 Other specified disorders of breast: Secondary | ICD-10-CM | POA: Diagnosis not present

## 2011-06-23 DIAGNOSIS — N6089 Other benign mammary dysplasias of unspecified breast: Secondary | ICD-10-CM | POA: Diagnosis not present

## 2011-06-24 ENCOUNTER — Ambulatory Visit
Admission: RE | Admit: 2011-06-24 | Discharge: 2011-06-24 | Disposition: A | Discharge status: Home / Self Care | Payer: Medicare Other | Source: Ambulatory Visit | Attending: Oncology | Admitting: Oncology

## 2011-06-24 DIAGNOSIS — N6489 Other specified disorders of breast: Secondary | ICD-10-CM | POA: Diagnosis not present

## 2011-06-24 DIAGNOSIS — Z853 Personal history of malignant neoplasm of breast: Secondary | ICD-10-CM | POA: Diagnosis not present

## 2011-06-24 DIAGNOSIS — R921 Mammographic calcification found on diagnostic imaging of breast: Secondary | ICD-10-CM

## 2011-06-30 ENCOUNTER — Telehealth (INDEPENDENT_AMBULATORY_CARE_PROVIDER_SITE_OTHER): Payer: Self-pay | Admitting: Surgery

## 2011-06-30 ENCOUNTER — Ambulatory Visit (INDEPENDENT_AMBULATORY_CARE_PROVIDER_SITE_OTHER): Payer: Medicare Other | Admitting: Surgery

## 2011-06-30 ENCOUNTER — Encounter (INDEPENDENT_AMBULATORY_CARE_PROVIDER_SITE_OTHER): Payer: Self-pay | Admitting: Surgery

## 2011-06-30 ENCOUNTER — Other Ambulatory Visit (HOSPITAL_COMMUNITY): Payer: Self-pay | Admitting: *Deleted

## 2011-06-30 ENCOUNTER — Other Ambulatory Visit (INDEPENDENT_AMBULATORY_CARE_PROVIDER_SITE_OTHER): Payer: Self-pay | Admitting: Surgery

## 2011-06-30 VITALS — BP 138/72 | HR 76 | Temp 98.4°F | Resp 16 | Ht 63.0 in | Wt 156.0 lb

## 2011-06-30 DIAGNOSIS — Z853 Personal history of malignant neoplasm of breast: Secondary | ICD-10-CM

## 2011-06-30 NOTE — Progress Notes (Signed)
[facility]  Jethro Radke, MD,  FACS [address]  [address] [place], [place]    [address] Phone:  [phone] FAX:  [phone]   Re:   Kaedynce N Maners DOB:   08/24/1927 MRN:   [id number]  ASSESSMENT AND PLAN: 1.  T1c, N0 right breast cancer.  Lumpectomy 06/26/2008.  ER - positive, PR - negative, Ki67 - 14%.  Her2Neu - neg  Dr. Magrinat and [facility] treating oncologist.  On anastrozole.  Tolerating this well.   2.  T1c, N0 left breast cancer.  Lumpectomy 06/24/2006.  Dr. T. Davis did this surgery.  ER/PR positive.  Ki67 - 12%.  Her2Neu - neg.  3.  Numbness right axilla. 4.  Left ear complaint/vertgo.  She saw Dr. C. Jaidan Stachnik.  This is now better. 5.  Left breast atypical lobular hyperplasia.  12 o'clock left breast on mammogram 06/17/2011.  Discussed needle loc biopsy with patient.  Will schedule at a time convenient with her. 6.  Hypertension. 7.  GERD. 8.  Hypothyroid - replacement.  HISTORY OF PRESENT ILLNESS: Chief Complaint  Patient presents with  . Breast Problem    new pt- eval atypical lobular hyperplasia    Vineta N Tsosie is a 78 y.o. (DOB: 06/28/1927)  white female who is a patient of Valerie Leschber, MD, MD and comes to me today for follow up for breast cancer.    She had a mammogram on 06/17/2011 that showed some microcalcifications at 12 o'clock in the left breast.  This prompted a biopsy of the left breast.  Has a biopsy of her left breast that shows ADH.  PHYSICAL EXAM: BP 138/72  Pulse 76  Temp(Src) 98.4 F (36.9 C) (Temporal)  Resp 16  Ht 5' 3" (1.6 m)  Wt 156 lb (70.761 kg)  BMI 27.63 kg/m2  HEENT:  Pupils equal.  Dentition good.  No injury. NECK:  Supple.  No thyroid mass. LYMPH NODES:  No cervical, supraclavicular, or axillary adenopathy. LUNGS:  Clear. HEART:  RRR. BREASTS -  RIGHT:  No palpable mass or nodule.  No nipple discharge.  Both breast are lumpy, but there is no specific mass in either breast.   LEFT:   No palpable mass or nodule.  No nipple discharge. UPPER EXTREMITIES:  No evidence of lymphedema.  DATA REVIEWED: Mammogram 06/17/2011 and path report.  Daeron Carreno, MD, FACS Office:  [phone]  

## 2011-06-30 NOTE — Telephone Encounter (Signed)
I called the pt and scheduled her for an appointment for 4/10 at 12:15.

## 2011-07-01 ENCOUNTER — Encounter (HOSPITAL_COMMUNITY): Payer: Self-pay | Admitting: Pharmacy Technician

## 2011-07-01 ENCOUNTER — Encounter (HOSPITAL_COMMUNITY)
Admission: RE | Admit: 2011-07-01 | Discharge: 2011-07-01 | Disposition: A | Discharge status: Home / Self Care | Payer: Medicare Other | Source: Ambulatory Visit | Attending: Anesthesiology | Admitting: Anesthesiology

## 2011-07-01 ENCOUNTER — Encounter (HOSPITAL_COMMUNITY): Payer: Self-pay

## 2011-07-01 ENCOUNTER — Encounter (HOSPITAL_COMMUNITY)
Admission: RE | Admit: 2011-07-01 | Discharge: 2011-07-01 | Disposition: A | Discharge status: Home / Self Care | Payer: Medicare Other | Source: Ambulatory Visit | Attending: Surgery | Admitting: Surgery

## 2011-07-01 DIAGNOSIS — Z01812 Encounter for preprocedural laboratory examination: Secondary | ICD-10-CM | POA: Diagnosis not present

## 2011-07-01 DIAGNOSIS — N6089 Other benign mammary dysplasias of unspecified breast: Secondary | ICD-10-CM | POA: Diagnosis not present

## 2011-07-01 DIAGNOSIS — Z853 Personal history of malignant neoplasm of breast: Secondary | ICD-10-CM | POA: Diagnosis not present

## 2011-07-01 DIAGNOSIS — K219 Gastro-esophageal reflux disease without esophagitis: Secondary | ICD-10-CM | POA: Diagnosis not present

## 2011-07-01 DIAGNOSIS — E119 Type 2 diabetes mellitus without complications: Secondary | ICD-10-CM | POA: Diagnosis not present

## 2011-07-01 DIAGNOSIS — Z01811 Encounter for preprocedural respiratory examination: Secondary | ICD-10-CM | POA: Diagnosis not present

## 2011-07-01 DIAGNOSIS — Z01818 Encounter for other preprocedural examination: Secondary | ICD-10-CM | POA: Diagnosis not present

## 2011-07-01 DIAGNOSIS — I10 Essential (primary) hypertension: Secondary | ICD-10-CM | POA: Diagnosis not present

## 2011-07-01 HISTORY — DX: Unspecified osteoarthritis, unspecified site: M19.90

## 2011-07-01 HISTORY — DX: Gastro-esophageal reflux disease without esophagitis: K21.9

## 2011-07-01 LAB — CBC
Hemoglobin: 12.7 g/dL (ref 12.0–15.0)
MCH: 31.4 pg (ref 26.0–34.0)
MCHC: 34 g/dL (ref 30.0–36.0)
MCV: 92.3 fL (ref 78.0–100.0)
Platelets: 157 10*3/uL (ref 150–400)

## 2011-07-01 LAB — BASIC METABOLIC PANEL
BUN: 16 mg/dL (ref 6–23)
Chloride: 102 mEq/L (ref 96–112)
Creatinine, Ser: 1 mg/dL (ref 0.50–1.10)
GFR calc Af Amer: 60 mL/min — ABNORMAL LOW (ref >90)
Glucose, Bld: 168 mg/dL — ABNORMAL HIGH (ref 70–99)
Potassium: 4 mEq/L (ref 3.5–5.1)

## 2011-07-01 LAB — SURGICAL PCR SCREEN: MRSA, PCR: NEGATIVE

## 2011-07-01 MED ORDER — CEFAZOLIN SODIUM 1-5 GM-% IV SOLN
1.0000 g | INTRAVENOUS | Status: AC
  Administered 2011-07-02: 1 g via INTRAVENOUS
  Filled 2011-07-01: qty 50

## 2011-07-01 MED ORDER — CHLORHEXIDINE GLUCONATE 4 % EX LIQD: 1.0000 "application " | Freq: Once | CUTANEOUS | Status: DC

## 2011-07-01 NOTE — Pre-Procedure Instructions (Signed)
20 Tami Jacobson  07/01/2011   Your procedure is scheduled on:  March 29  Report to [facility] at After needle localization appt.  Call this number if you have problems the morning of surgery: [phone]   Remember:   Do not eat food:After Midnight.  May have clear liquids: up to 4 Hours before arrival.  Clear liquids include soda, tea, black coffee, apple or grape juice, broth.  Take these medicines the morning of surgery with A SIP OF WATER: Metoprolol, Prilosec, Anastrozle   Do not wear jewelry, make-up or nail polish.  Do not wear lotions, powders, or perfumes. You may wear deodorant.  Do not shave 48 hours prior to surgery.  Do not bring valuables to the hospital.  Contacts, dentures or bridgework may not be worn into surgery.  Leave suitcase in the car. After surgery it may be brought to your room.  For patients admitted to the hospital, checkout time is 11:00 AM the day of discharge.   Patients discharged the day of surgery will not be allowed to drive home.  Name and phone number of your driver: husband  Special Instructions: CHG Shower Use Special Wash: 1/2 bottle night before surgery and 1/2 bottle morning of surgery.   Please read over the following fact sheets that you were given: Pain Booklet, Coughing and Deep Breathing and Surgical Site Infection Prevention

## 2011-07-02 ENCOUNTER — Encounter (HOSPITAL_COMMUNITY): Payer: Self-pay | Admitting: Anesthesiology

## 2011-07-02 ENCOUNTER — Ambulatory Visit (HOSPITAL_COMMUNITY)
Admission: RE | Admit: 2011-07-02 | Discharge: 2011-07-02 | Disposition: A | Discharge status: Home / Self Care | Payer: Medicare Other | Source: Ambulatory Visit | Attending: Surgery | Admitting: Surgery

## 2011-07-02 ENCOUNTER — Ambulatory Visit
Admission: RE | Admit: 2011-07-02 | Discharge: 2011-07-02 | Disposition: A | Discharge status: Home / Self Care | Payer: Medicare Other | Source: Ambulatory Visit | Attending: Surgery | Admitting: Surgery

## 2011-07-02 ENCOUNTER — Ambulatory Visit (HOSPITAL_COMMUNITY): Payer: Medicare Other | Admitting: Anesthesiology

## 2011-07-02 ENCOUNTER — Encounter (HOSPITAL_COMMUNITY)
Admission: RE | Disposition: A | Discharge status: Home / Self Care | Payer: Self-pay | Source: Ambulatory Visit | Attending: Surgery

## 2011-07-02 DIAGNOSIS — E119 Type 2 diabetes mellitus without complications: Secondary | ICD-10-CM | POA: Insufficient documentation

## 2011-07-02 DIAGNOSIS — Z853 Personal history of malignant neoplasm of breast: Secondary | ICD-10-CM | POA: Insufficient documentation

## 2011-07-02 DIAGNOSIS — I10 Essential (primary) hypertension: Secondary | ICD-10-CM | POA: Diagnosis not present

## 2011-07-02 DIAGNOSIS — D4860 Neoplasm of uncertain behavior of unspecified breast: Secondary | ICD-10-CM | POA: Diagnosis not present

## 2011-07-02 DIAGNOSIS — N6019 Diffuse cystic mastopathy of unspecified breast: Secondary | ICD-10-CM | POA: Diagnosis not present

## 2011-07-02 DIAGNOSIS — N6089 Other benign mammary dysplasias of unspecified breast: Secondary | ICD-10-CM | POA: Diagnosis not present

## 2011-07-02 DIAGNOSIS — K219 Gastro-esophageal reflux disease without esophagitis: Secondary | ICD-10-CM | POA: Insufficient documentation

## 2011-07-02 DIAGNOSIS — Z01812 Encounter for preprocedural laboratory examination: Secondary | ICD-10-CM | POA: Insufficient documentation

## 2011-07-02 DIAGNOSIS — Z01818 Encounter for other preprocedural examination: Secondary | ICD-10-CM | POA: Insufficient documentation

## 2011-07-02 DIAGNOSIS — N62 Hypertrophy of breast: Secondary | ICD-10-CM | POA: Diagnosis not present

## 2011-07-02 HISTORY — PX: BREAST BIOPSY: SHX20

## 2011-07-02 HISTORY — PX: BREAST EXCISIONAL BIOPSY: SUR124

## 2011-07-02 LAB — GLUCOSE, CAPILLARY: Glucose-Capillary: 169 mg/dL — ABNORMAL HIGH (ref 70–99)

## 2011-07-02 SURGERY — BREAST BIOPSY WITH NEEDLE LOCALIZATION
Anesthesia: General | Site: Breast | Laterality: Left | Wound class: Clean

## 2011-07-02 MED ORDER — HYDROMORPHONE HCL PF 1 MG/ML IJ SOLN: 0.2500 mg | INTRAMUSCULAR | Status: DC | PRN

## 2011-07-02 MED ORDER — DEXTROSE 5 % IV SOLN
INTRAVENOUS | Status: DC | PRN
  Administered 2011-07-02: 11:00:00 via INTRAVENOUS

## 2011-07-02 MED ORDER — SUCCINYLCHOLINE CHLORIDE 20 MG/ML IJ SOLN
INTRAMUSCULAR | Status: DC | PRN
  Administered 2011-07-02: 30 mg via INTRAVENOUS

## 2011-07-02 MED ORDER — MIDAZOLAM HCL 5 MG/5ML IJ SOLN
INTRAMUSCULAR | Status: DC | PRN
  Administered 2011-07-02: 2 mg via INTRAVENOUS

## 2011-07-02 MED ORDER — LACTATED RINGERS IV SOLN
INTRAVENOUS | Status: DC
  Administered 2011-07-02: 11:00:00 via INTRAVENOUS

## 2011-07-02 MED ORDER — ONDANSETRON HCL 4 MG/2ML IJ SOLN
INTRAMUSCULAR | Status: DC | PRN
  Administered 2011-07-02: 4 mg via INTRAVENOUS

## 2011-07-02 MED ORDER — FENTANYL CITRATE 0.05 MG/ML IJ SOLN
INTRAMUSCULAR | Status: DC | PRN
  Administered 2011-07-02 (×2): 50 ug via INTRAVENOUS

## 2011-07-02 MED ORDER — LIDOCAINE HCL 4 % MT SOLN
OROMUCOSAL | Status: DC | PRN
  Administered 2011-07-02: 160 mL via TOPICAL

## 2011-07-02 MED ORDER — 0.9 % SODIUM CHLORIDE (POUR BTL) OPTIME
TOPICAL | Status: DC | PRN
  Administered 2011-07-02: 1000 mL

## 2011-07-02 MED ORDER — HYDROCODONE-ACETAMINOPHEN 5-325 MG PO TABS: 1.0000 | ORAL_TABLET | Freq: Four times a day (QID) | ORAL | Status: AC | PRN

## 2011-07-02 MED ORDER — PROPOFOL 10 MG/ML IV EMUL
INTRAVENOUS | Status: DC | PRN
  Administered 2011-07-02: 100 mg via INTRAVENOUS

## 2011-07-02 MED ORDER — EPHEDRINE SULFATE 50 MG/ML IJ SOLN
INTRAMUSCULAR | Status: DC | PRN
  Administered 2011-07-02: 5 mg via INTRAVENOUS

## 2011-07-02 MED ORDER — ONDANSETRON HCL 4 MG/2ML IJ SOLN: 4.0000 mg | Freq: Once | INTRAMUSCULAR | Status: DC | PRN

## 2011-07-02 MED ORDER — BUPIVACAINE LIPOSOME 1.3 % IJ SUSP
INTRAMUSCULAR | Status: DC | PRN
  Administered 2011-07-02: 20 mL

## 2011-07-02 MED ORDER — LACTATED RINGERS IV SOLN
INTRAVENOUS | Status: DC | PRN
  Administered 2011-07-02 (×2): via INTRAVENOUS

## 2011-07-02 MED ORDER — DEXAMETHASONE SODIUM PHOSPHATE 4 MG/ML IJ SOLN
INTRAMUSCULAR | Status: DC | PRN
  Administered 2011-07-02: 4 mg via INTRAVENOUS

## 2011-07-02 MED ORDER — BUPIVACAINE LIPOSOME 1.3 % IJ SUSP
20.0000 mL | Freq: Once | INTRAMUSCULAR | Status: DC
  Filled 2011-07-02: qty 20

## 2011-07-02 SURGICAL SUPPLY — 44 items
APPLIER CLIP 9.375 MED OPEN (MISCELLANEOUS)
BINDER BREAST LRG (GAUZE/BANDAGES/DRESSINGS) IMPLANT
BINDER BREAST XLRG (GAUZE/BANDAGES/DRESSINGS) ×2 IMPLANT
CANISTER SUCTION 2500CC (MISCELLANEOUS) ×2 IMPLANT
CHLORAPREP W/TINT 26ML (MISCELLANEOUS) ×2 IMPLANT
CLIP APPLIE 9.375 MED OPEN (MISCELLANEOUS) IMPLANT
CLOTH BEACON ORANGE TIMEOUT ST (SAFETY) ×2 IMPLANT
COVER SURGICAL LIGHT HANDLE (MISCELLANEOUS) ×2 IMPLANT
DECANTER SPIKE VIAL GLASS SM (MISCELLANEOUS) ×2 IMPLANT
DERMABOND ADVANCED (GAUZE/BANDAGES/DRESSINGS) ×1
DERMABOND ADVANCED .7 DNX12 (GAUZE/BANDAGES/DRESSINGS) ×1 IMPLANT
DEVICE DUBIN SPECIMEN MAMMOGRA (MISCELLANEOUS) ×2 IMPLANT
DRAPE LAPAROSCOPIC ABDOMINAL (DRAPES) ×2 IMPLANT
DRAPE UTILITY 15X26 (DRAPE) ×2 IMPLANT
ELECT CAUTERY BLADE 6.4 (BLADE) ×2 IMPLANT
ELECT COATED BLADE 2.86 ST (ELECTRODE) ×2 IMPLANT
ELECT REM PT RETURN 9FT ADLT (ELECTROSURGICAL) ×2
ELECTRODE REM PT RTRN 9FT ADLT (ELECTROSURGICAL) ×1 IMPLANT
GLOVE BIOGEL PI IND STRL 6.5 (GLOVE) ×1 IMPLANT
GLOVE BIOGEL PI INDICATOR 6.5 (GLOVE) ×1
GLOVE ECLIPSE 8.0 STRL XLNG CF (GLOVE) ×2 IMPLANT
GLOVE SURG SIGNA 7.5 PF LTX (GLOVE) ×4 IMPLANT
GLOVE SURG SS PI 6.5 STRL IVOR (GLOVE) ×2 IMPLANT
GOWN PREVENTION PLUS XLARGE (GOWN DISPOSABLE) ×2 IMPLANT
GOWN STRL NON-REIN LRG LVL3 (GOWN DISPOSABLE) ×2 IMPLANT
KIT BASIN OR (CUSTOM PROCEDURE TRAY) ×2 IMPLANT
KIT MARKER MARGIN INK (KITS) ×2 IMPLANT
KIT ROOM TURNOVER OR (KITS) ×2 IMPLANT
NEEDLE HYPO 25GX1X1/2 BEV (NEEDLE) ×2 IMPLANT
NS IRRIG 1000ML POUR BTL (IV SOLUTION) ×2 IMPLANT
PACK GENERAL/GYN (CUSTOM PROCEDURE TRAY) ×2 IMPLANT
PAD ARMBOARD 7.5X6 YLW CONV (MISCELLANEOUS) ×2 IMPLANT
SPONGE GAUZE 4X4 12PLY (GAUZE/BANDAGES/DRESSINGS) ×4 IMPLANT
SPONGE LAP 4X18 X RAY DECT (DISPOSABLE) ×2 IMPLANT
STAPLER VISISTAT 35W (STAPLE) IMPLANT
SUT MNCRL AB 4-0 PS2 18 (SUTURE) ×2 IMPLANT
SUT SILK 2 0 SH (SUTURE) IMPLANT
SUT VIC AB 3-0 SH 18 (SUTURE) ×2 IMPLANT
SUT VICRYL AB 3 0 TIES (SUTURE) IMPLANT
SYR CONTROL 10ML LL (SYRINGE) ×2 IMPLANT
TAPE CLOTH SURG 4X10 WHT LF (GAUZE/BANDAGES/DRESSINGS) ×2 IMPLANT
TOWEL OR 17X24 6PK STRL BLUE (TOWEL DISPOSABLE) ×2 IMPLANT
TOWEL OR 17X26 10 PK STRL BLUE (TOWEL DISPOSABLE) ×2 IMPLANT
WATER STERILE IRR 1000ML POUR (IV SOLUTION) IMPLANT

## 2011-07-02 NOTE — Discharge Instructions (Signed)
DISCHARGE INSTRUCTIONS TO PATIENT  Return to work on:  N/A  Activity:  Driving - may drive tomorrow, if doing well.   Lifting - no limit  Wound Care:   Leave bandage for 2 days.  Then may remove bandage and shower.  Diet:  As tolerated  Follow up appointment:  Call Dr. Allene Pyo office [facility]) at [phone] for an appointment in about 2 weeks.  Medications and dosages:  Resume your home medications.  You have a prescription for:  Vicodin.  Call Dr. Ezzard Standing or his office  [phone]) if you have:  Temperature greater than 100.4,  Persistent nausea and vomiting,  Severe uncontrolled pain,  Redness, tenderness, or signs of infection (pain, swelling, redness, odor or green/yellow discharge around the site),  Difficulty breathing, headache or visual disturbances,  Any other questions or concerns you may have after discharge.  In an emergency, call 911 or go to an Emergency Department at a nearby hospital.  Instructions Following General Anesthetic, Adult A nurse specialized in giving anesthesia (anesthetist) or a doctor specialized in giving anesthesia (anesthesiologist) gave you a medicine that made you sleep while a procedure was performed. For as long as 24 hours following this procedure, you may feel:  Dizzy.   Weak.   Drowsy.  AFTER THE PROCEDURE After surgery, you will be taken to the recovery area where a nurse will monitor your progress. You will be allowed to go home when you are awake, stable, taking fluids well, and without complications. For the first 24 hours following an anesthetic:  Have a responsible person with you.   Do not drive a car. If you are alone, do not take public transportation.   Do not drink alcohol.   Do not take medicine that has not been prescribed by your caregiver.   Do not sign important papers or make important decisions.   You may resume normal diet and activities as directed.   Change bandages (dressings) as  directed.   Only take over-the-counter or prescription medicines for pain, discomfort, or fever as directed by your caregiver.  If you have questions or problems that seem related to the anesthetic, call the hospital and ask for the anesthetist or anesthesiologist on call. SEEK IMMEDIATE MEDICAL CARE IF:   You develop a rash.   You have difficulty breathing.   You have chest pain.   You develop any allergic problems.  Document Released: 06/28/2000 Document Revised: 03/11/2011 Document Reviewed: 02/06/2007 [facility] Patient Information 2012 [place], [place].

## 2011-07-02 NOTE — Anesthesia Preprocedure Evaluation (Addendum)
Anesthesia Evaluation  Patient identified by MRN, date of birth, ID band Patient awake    Reviewed: Allergy & Precautions, H&P , NPO status , Patient's Chart, lab work & pertinent test results, reviewed documented beta blocker date and time   Airway Mallampati: II TM Distance: >3 FB Neck ROM: Full    Dental  (+) Teeth Intact and Dental Advisory Given   Pulmonary neg pulmonary ROS,          Cardiovascular hypertension, Pt. on home beta blockers and Pt. on medications + dysrhythmias Rhythm:regular Rate:Normal     Neuro/Psych negative neurological ROS  negative psych ROS   GI/Hepatic Neg liver ROS, GERD-  Medicated and Controlled,  Endo/Other  Diabetes mellitus-, Well Controlled, Type 2  Renal/GU negative Renal ROS  negative genitourinary   Musculoskeletal  (+) Arthritis -, Osteoarthritis,    Abdominal   Peds  Hematology negative hematology ROS (+)   Anesthesia Other Findings   Reproductive/Obstetrics negative OB ROS                         Anesthesia Physical Anesthesia Plan  ASA: III  Anesthesia Plan: General   Post-op Pain Management:    Induction: Intravenous  Airway Management Planned: Oral ETT and LMA  Additional Equipment:   Intra-op Plan:   Post-operative Plan: Extubation in OR  Informed Consent: I have reviewed the patients History and Physical, chart, labs and discussed the procedure including the risks, benefits and alternatives for the proposed anesthesia with the patient or authorized representative who has indicated his/her understanding and acceptance.   Dental advisory given  Plan Discussed with: CRNA, Anesthesiologist and Surgeon  Anesthesia Plan Comments:        Anesthesia Quick Evaluation

## 2011-07-02 NOTE — Preoperative (Signed)
Beta Blockers   Reason not to administer Beta Blockers:Not Applicable, Taken this am per patient 

## 2011-07-02 NOTE — Anesthesia Procedure Notes (Signed)
Procedure Name: Intubation Date/Time: 07/02/2011 11:45 AM Performed by: Tylyn Stankovich S Pre-anesthesia Checklist: Patient identified, Emergency Drugs available, Suction available, Patient being monitored and Timeout performed Patient Re-evaluated:Patient Re-evaluated prior to inductionOxygen Delivery Method: Circle system utilized Preoxygenation: Pre-oxygenation with 100% oxygen Intubation Type: IV induction Ventilation: Mask ventilation without difficulty Laryngoscope Size: Mac and 3 Grade View: Grade I Tube type: Oral Tube size: 7.0 mm Number of attempts: 1 Airway Equipment and Method: Stylet Placement Confirmation: ETT inserted through vocal cords under direct vision,  positive ETCO2 and breath sounds checked- equal and bilateral Secured at: 21 cm Tube secured with: Tape Dental Injury: Teeth and Oropharynx as per pre-operative assessment

## 2011-07-02 NOTE — Interval H&P Note (Signed)
History and Physical Interval Note:  07/02/2011 11:00 AM  Tami Jacobson  has presented today for surgery, with the diagnosis of left breast atypia  The various methods of treatment have been discussed with the patient and family. Her husband and daughter are with her today.  After consideration of risks, benefits and other options for treatment, the patient has consented to  Procedure(s) (LRB): BREAST BIOPSY WITH NEEDLE LOCALIZATION (Left) as a surgical intervention .  The patients' history has been reviewed, patient examined, no change in status, stable for surgery.    I have reviewed the patients' chart and labs.  Questions were answered to the patient's satisfaction.     Anapaula Severt H

## 2011-07-02 NOTE — Transfer of Care (Signed)
Immediate Anesthesia Transfer of Care Note  Patient: Tami Jacobson  Procedure(s) Performed: Procedure(s) (LRB): BREAST BIOPSY WITH NEEDLE LOCALIZATION (Left)  Patient Location: PACU  Anesthesia Type: General  Level of Consciousness: awake, alert  and oriented  Airway & Oxygen Therapy: Patient Spontanous Breathing and Patient connected to nasal cannula oxygen  Post-op Assessment: Report given to PACU RN and Post -op Vital signs reviewed and stable  Post vital signs: Reviewed and stable  Complications: No apparent anesthesia complications

## 2011-07-02 NOTE — Anesthesia Postprocedure Evaluation (Signed)
  Anesthesia Post-op Note  Patient: Tami Jacobson  Procedure(s) Performed: Procedure(s) (LRB): BREAST BIOPSY WITH NEEDLE LOCALIZATION (Left)  Patient Location: PACU  Anesthesia Type: General  Level of Consciousness: awake, alert , oriented and patient cooperative  Airway and Oxygen Therapy: Patient Spontanous Breathing and Patient connected to nasal cannula oxygen  Post-op Pain: mild  Post-op Assessment: Post-op Vital signs reviewed, Patient's Cardiovascular Status Stable, Respiratory Function Stable, Patent Airway, No signs of Nausea or vomiting and Pain level controlled  Post-op Vital Signs: stable  Complications: No apparent anesthesia complications

## 2011-07-02 NOTE — H&P (View-Only) (Signed)
[facility]  Ovidio Kin, MD,  FACS [address]  [address] [place], [place]    [address] Phone:  [phone] FAX:  [phone]   Re:   Tami Jacobson DOB:   [birth year] MRN:   [id number]  ASSESSMENT AND PLAN: 1.  T1c, N0 right breast cancer.  Lumpectomy 06/26/2008.  ER - positive, PR - negative, Ki67 - 14%.  Her2Neu - neg  Dr. Darnelle Catalan and [facility] treating oncologist.  On anastrozole.  Tolerating this well.   2.  T1c, N0 left breast cancer.  Lumpectomy 06/24/2006.  Dr. Cindee Lame did this surgery.  ER/PR positive.  Ki67 - 12%.  Her2Neu - neg.  3.  Numbness right axilla. 4.  Left ear complaint/vertgo.  She saw Dr. Salena Saner. Tami Jacobson.  This is now better. 5.  Left breast atypical lobular hyperplasia.  12 o'clock left breast on mammogram 06/17/2011.  Discussed needle loc biopsy with patient.  Will schedule at a time convenient with her. 6.  Hypertension. 7.  GERD. 8.  Hypothyroid - replacement.  HISTORY OF PRESENT ILLNESS: Chief Complaint  Patient presents with  . Breast Problem    new pt- eval atypical lobular hyperplasia    Tami Jacobson is a 78 y.o. (DOB: 1927/04/06)  white female who is a patient of Rene Paci, MD, MD and comes to me today for follow up for breast cancer.    She had a mammogram on 06/17/2011 that showed some microcalcifications at 12 o'clock in the left breast.  This prompted a biopsy of the left breast.  Has a biopsy of her left breast that shows ADH.  PHYSICAL EXAM: BP 138/72  Pulse 76  Temp(Src) 98.4 F (36.9 C) (Temporal)  Resp 16  Ht 5\' 3"  (1.6 m)  Wt 156 lb (70.761 kg)  BMI 27.63 kg/m2  HEENT:  Pupils equal.  Dentition good.  No injury. NECK:  Supple.  No thyroid mass. LYMPH NODES:  No cervical, supraclavicular, or axillary adenopathy. LUNGS:  Clear. HEART:  RRR. BREASTS -  RIGHT:  No palpable mass or nodule.  No nipple discharge.  Both breast are lumpy, but there is no specific mass in either breast.   LEFT:   No palpable mass or nodule.  No nipple discharge. UPPER EXTREMITIES:  No evidence of lymphedema.  DATA REVIEWED: Mammogram 06/17/2011 and path report.  Ovidio Kin, MD, FACS Office:  [phone]

## 2011-07-03 NOTE — Op Note (Signed)
[name], Tami Jacobson NO.:  [id number]  MEDICAL RECORD NO.:  [id number]  LOCATION:  [facility]                         FACILITY:  [facility]  PHYSICIAN:  Sandria Bales. Ezzard Standing, M.D.  DATE OF BIRTH:  1928-01-11  DATE OF PROCEDURE:  07/02/2011                              OPERATIVE REPORT   PREOPERATIVE DIAGNOSIS:  Atypical lobular hyperplasia at the 12 o'clock position in the left breast.  POSTOPERATIVE DIAGNOSIS:  Atypical lobular hyperplasia, 12 o'clock position in left breast; final pathology pending.  PROCEDURE:  Wire localization left breast biopsy.  SURGEON:  Sandria Bales. Ezzard Standing, M.D.  ASSISTANT:  No first assistant.  ANESTHESIA:  General endotracheal, supervised by Dr. Diamantina Monks.  Local was 20 cc of Exparel.  ESTIMATED BLOOD LOSS:  Minimal.  COMPLICATIONS:  None.  INDICATION FOR PROCEDURE:  [name] is as an 78 year old white female who sees Dr. Rene Paci.  She has had bilateral breast cancers with her left breast cancer in March 2008 and the right breast cancer in March 2010, both treated with lumpectomy.  She has been doing well and on a recent mammogram, noticed some microcalcifications in the trocar position of the left breast.  She underwent a biopsy, which showed evidence of a fibroadenoma and atypical lobular hyperplasia, comes in for wider excision of this area.  OPERATIVE NOTE: The patient had a wire placed at the [facility].  She presented to the [facility], was taken to room #2, where she had a general endotracheal anesthetic supervised by Dr. Diamantina Monks.  Her left breast was prepped with ChloraPrep, sterilely draped.    A time-out was held and the surgical checklist run.  I then did an elliptical incision excising a block of breast tissue about 4 x 5 cm, excising the wire in its entirety.  The specimen mammogram confirmed the wire and clip on the middle of the specimen. The specimen was painted with 6 color paint kit for  orientation purposes and sent to Pathology.  The wound was then irrigated.  Hemostasis controlled with Bovie electrocautery and 3-0 Vicryl pop offs.  Subcutaneous tissues and breasts were infiltrated about 20 mL of 0.25% Marcaine, then closed the wound with 3-0 Vicryl sutures and the skin with a 5-0 Vicryl painted with Dermabond.  She will check back with me in about 2 weeks for wound check.  Her sponge and needle counts were reported to be correct.  She was transferred to recovery room in good condition.   Sandria Bales. Ezzard Standing, M.D., FACS   DHN/MEDQ  D:  07/02/2011  T:  07/03/2011  Job:  [id number]  cc:   Vikki Ports A. Felicity Coyer, MD [address] [place], [place] [address]  Lowella Dell, M.D. Fax: [phone]  Madolyn Frieze. Jens Som, MD, [facility]  Dr. Kathrynn Running

## 2011-07-06 ENCOUNTER — Encounter (HOSPITAL_COMMUNITY): Payer: Self-pay | Admitting: Surgery

## 2011-07-08 ENCOUNTER — Other Ambulatory Visit (HOSPITAL_BASED_OUTPATIENT_CLINIC_OR_DEPARTMENT_OTHER): Payer: Medicare Other

## 2011-07-08 DIAGNOSIS — C50519 Malignant neoplasm of lower-outer quadrant of unspecified female breast: Secondary | ICD-10-CM | POA: Diagnosis not present

## 2011-07-08 DIAGNOSIS — Z170 Estrogen receptor positive status [ER+]: Secondary | ICD-10-CM | POA: Diagnosis not present

## 2011-07-08 DIAGNOSIS — C50319 Malignant neoplasm of lower-inner quadrant of unspecified female breast: Secondary | ICD-10-CM

## 2011-07-08 LAB — COMPREHENSIVE METABOLIC PANEL
ALT: 15 U/L (ref 0–35)
Albumin: 4.4 g/dL (ref 3.5–5.2)
CO2: 24 mEq/L (ref 19–32)
Calcium: 9.5 mg/dL (ref 8.4–10.5)
Chloride: 103 mEq/L (ref 96–112)
Creatinine, Ser: 1.05 mg/dL (ref 0.50–1.10)
Potassium: 4.1 mEq/L (ref 3.5–5.3)
Sodium: 139 mEq/L (ref 135–145)
Total Protein: 6.9 g/dL (ref 6.0–8.3)

## 2011-07-08 LAB — CBC WITH DIFFERENTIAL/PLATELET
Eosinophils Absolute: 0.2 10*3/uL (ref 0.0–0.5)
LYMPH%: 32 % (ref 14.0–49.7)
MONO#: 0.5 10*3/uL (ref 0.1–0.9)
NEUT#: 3.6 10*3/uL (ref 1.5–6.5)
Platelets: 165 10*3/uL (ref 145–400)
RBC: 4.18 10*6/uL (ref 3.70–5.45)
WBC: 6.3 10*3/uL (ref 3.9–10.3)
nRBC: 0 % (ref 0–0)

## 2011-07-14 ENCOUNTER — Ambulatory Visit (INDEPENDENT_AMBULATORY_CARE_PROVIDER_SITE_OTHER): Payer: Medicare Other | Admitting: Surgery

## 2011-07-14 ENCOUNTER — Encounter (INDEPENDENT_AMBULATORY_CARE_PROVIDER_SITE_OTHER): Payer: Self-pay | Admitting: Surgery

## 2011-07-14 VITALS — BP 122/76 | HR 76 | Temp 97.4°F | Resp 14 | Ht 63.0 in | Wt 152.6 lb

## 2011-07-14 DIAGNOSIS — N6019 Diffuse cystic mastopathy of unspecified breast: Secondary | ICD-10-CM | POA: Insufficient documentation

## 2011-07-14 DIAGNOSIS — Z853 Personal history of malignant neoplasm of breast: Secondary | ICD-10-CM

## 2011-07-14 NOTE — Progress Notes (Signed)
[facility]  Tami Kin, MD,  FACS [address]  [address] [place], [place]    [address] Phone:  [phone] FAX:  [phone]   Re:   Tami Jacobson DOB:   09/20/1927 MRN:   [id number]  ASSESSMENT AND PLAN: 1.  T1c, N0 right breast cancer.  Lumpectomy 06/26/2008.  ER - positive, PR - negative, Ki67 - 14%.  Her2Neu - neg  Dr. Darnelle Catalan and [facility] treating oncologist.  On anastrozole.  Tolerating this well.   2.  T1c, N0 left breast cancer.  Lumpectomy 06/24/2006.  Dr. Cindee Lame did this surgery.  ER/PR positive.  Ki67 - 12%.  Her2Neu - neg.   3.  Left breast atypical lobular hyperplasia.  12 o'clock left breast on mammogram 06/17/2011.  Open biopsy by me - 07/02/2011 - shows atypical lobular neoplasia, no cancerous changes.  She needs no further diagnostic test.  I will see her back in 6 months.  She sees Dr. Darnelle Catalan tomorrow.  4.  Numbness right axilla. 5.  Left ear complaint/vertgo.  She saw Dr. Salena Saner. Tami Jacobson.  This is now better. 6.  Hypertension. 7.  GERD. 8.  Hypothyroid - replacement.  HISTORY OF PRESENT ILLNESS: Tami Jacobson is a 78 y.o. (DOB: [birth year])  white female who is a patient of Rene Paci, MD, MD and comes to me today for follow up for of left breast biopsy.    She had a mammogram on 06/17/2011 that showed some microcalcifications at 12 o'clock in the left breast.  This prompted a core biopsy of the left breast.  The core biopsy of her left breast showed ADH.  I took her to the OR on 07/02/2011 and did a left breast biopsy, which showed atypical lobular hyperplasia.  She has done well and is in good spirits.  PHYSICAL EXAM: BP 122/76  Pulse 76  Temp(Src) 97.4 F (36.3 C) (Temporal)  Resp 14  Ht 5\' 3"  (1.6 m)  Wt 152 lb 9.6 oz (69.219 kg)  BMI 27.03 kg/m2  BREASTS -  RIGHT:  No palpable mass or nodule.  No nipple discharge.   LEFT:  Incision at 12 - 1 o'clock looks good.  DATA REVIEWED: Path report -  07/02/2011.  Tami Kin, MD, FACS Office:  [phone]

## 2011-07-15 ENCOUNTER — Telehealth: Payer: Self-pay | Admitting: *Deleted

## 2011-07-15 ENCOUNTER — Other Ambulatory Visit: Payer: Self-pay | Admitting: Oncology

## 2011-07-15 ENCOUNTER — Ambulatory Visit (INDEPENDENT_AMBULATORY_CARE_PROVIDER_SITE_OTHER): Payer: Medicare Other | Admitting: Surgery

## 2011-07-15 ENCOUNTER — Ambulatory Visit (HOSPITAL_BASED_OUTPATIENT_CLINIC_OR_DEPARTMENT_OTHER): Payer: Medicare Other | Admitting: Oncology

## 2011-07-15 VITALS — BP 120/69 | HR 65 | Temp 98.3°F | Ht 63.0 in | Wt 154.0 lb

## 2011-07-15 DIAGNOSIS — Z853 Personal history of malignant neoplasm of breast: Secondary | ICD-10-CM

## 2011-07-15 DIAGNOSIS — Z79811 Long term (current) use of aromatase inhibitors: Secondary | ICD-10-CM

## 2011-07-15 DIAGNOSIS — C50919 Malignant neoplasm of unspecified site of unspecified female breast: Secondary | ICD-10-CM

## 2011-07-15 DIAGNOSIS — D0590 Unspecified type of carcinoma in situ of unspecified breast: Secondary | ICD-10-CM | POA: Diagnosis not present

## 2011-07-15 DIAGNOSIS — Z170 Estrogen receptor positive status [ER+]: Secondary | ICD-10-CM

## 2011-07-15 NOTE — Progress Notes (Signed)
ID: Tami Jacobson   DOB: September 24, 1927  MR#: [id number]  JXB#:[id number]  HISTORY OF PRESENT ILLNESS: Tami Jacobson had a routine screening mammogram at [facility] 06/06/2006.  This showed new microcalcifications in the inner lower quadrant of the left breast.  The patient was referred to the [facility] and on 06/10/2006, she had a stereotactic biopsy, which showed ([id number]) a ductal carcinoma in situ, intermediate grade, which was strongly ER and PR positive (both at 100%).    With this information, the patient was referred to Dr. Kendrick Ranch and bilateral breast MRIs were obtained 06/23/2006.  This showed only the solitary mass in the left breast consistent with the known malignancy and accordingly Dr. Earlene Plater proceeded to excisional biopsy 07/04/2006.  The final pathology there ([id number]) showed in addition to the ductal carcinoma in situ a 1.2-cm area of invasive ductal carcinoma, grade 1, with very close, although negative margins.  No evidence of lymphovascular invasion.  The patient did not have a sentinel lymph node because she was not known to have invasive disease. Her subsequent history is as detailed below.  INTERVAL HISTORY: Since her last visit here now she had some calcifications develop in her left breast, leading to biopsy which showed LCIS, and definitive surgery with a needle localization lumpectomy 07/02/2011. Fortunately that also showed only lobular carcinoma in situ  REVIEW OF SYSTEMS: She has been a wonderful recovery. She tells me her left breast was always bigger than the right and now they're the same size, so she got "aplastic procedure at the same time". There has been no unusual pain, fever, swelling, redness, or other complication. Yesterday she mowed her yard and does not flower bed. A detailed review of systems was otherwise entirely stable  PAST MEDICAL HISTORY: Past Medical History  Diagnosis Date  . Vitamin B12 deficiency   . Carcinoma in  situ of breast     R 06/2006, L 06/2008  . Esophageal stricture   . Diverticulosis   . Hypothyroid   . Hyperlipidemia     intol of all statins  . Anemia   . Gastritis   . Alopecia   . Carotid bruit   . Premature ventricular contraction   . PAC (premature atrial contraction)   . Diabetes mellitus     controlled by diet  . GERD (gastroesophageal reflux disease)   . Arthritis   Significant for hypertension, GERD, history of palpitations, history of osteoarthritis, diverticular disease, history of ocular migraines, heart murmur, status post cholecystectomy, status post appendectomy, status post simple hysterectomy without salpingo-oophorectomy, status post tonsillectomy and adenoidectomy, status post mild to moderate hypercholesterolemia.    PAST SURGICAL HISTORY: Past Surgical History  Procedure Date  . Abdominal hysterectomy 1979  . Appendectomy 2002  . Cholecystectomy 2002  . Tonsillectomy 1942  . Esophageal dilation 2006  . Breast lumpectomy 2008, 2010    X 2  . Cataract extraction   . Breast biopsy 07/02/2011    Procedure: BREAST BIOPSY WITH NEEDLE LOCALIZATION;  Surgeon: Kandis Cocking, MD;  Location: [facility] OR;  Service: General;  Laterality: Left;  left breast atypical hyperplasia needle localization biopsy    FAMILY HISTORY Family History  Problem Relation Age of Onset  . Colon cancer Mother   . Breast cancer Sister     x 3  . Diverticulitis Sister   . Cancer Sister     breast  . Heart disease Maternal Uncle     CABG  . Diverticulitis Sister   .  Cancer Sister     leukemia  . Heart disease Brother   . Anesthesia problems Neg Hx   The patient's father died at the age of 52, she believes, from suicide.  The patient's mother died at the age of 77 in the setting of colon cancer, which had been diagnosed when she was 30.  She had a myocardial infarction and apparently a ruptured aneurysm as the cause of death.  The patient is 1 of 5 sisters; 2 sisters died from breast  cancer--1 was diagnosed age 110 and died at age 57, 1 was diagnosed age 50 and died at age 55.  A third sister was diagnosed at age 71 and is alive 2 years later with breast cancer, and of course, the patient now has just been diagnosed so there are 4 out of 5 sisters with breast cancer, but none before age 26.  There are also 2 brothers, neither with cancer.  GYNECOLOGIC HISTORY: She is GX P1, first pregnancy age 5.  She took Premarin for 29 years, stopping about 5 years ago.    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  She has been married to Lawrence by Elijah Birk or Tommy--for 54 years.  He used to work for [employer].  Her daughter, Harlan Stains, is a Manufacturing systems engineer..  The patient has a 73-year-old grandchild.  She attends [employer] in [place].    ADVANCED DIRECTIVES:  HEALTH MAINTENANCE: History  Substance Use Topics  . Smoking status: Never Smoker   . Smokeless tobacco: Never Used  . Alcohol Use: No     Colonoscopy:  PAP:  Bone density:  Lipid panel:  Allergies  Allergen Reactions  . Cephalexin   . Codeine   . Lansoprazole   . Levofloxacin   . Nitrofurantoin   . Rofecoxib   . Statins     Current Outpatient Prescriptions  Medication Sig Dispense Refill  . anastrozole (ARIMIDEX) 1 MG tablet Take 1 mg by mouth daily.        . clobetasol cream (TEMOVATE) 0.05 % 1 application as needed. Irritated skin      . cyanocobalamin (,VITAMIN B-12,) 1000 MCG/ML injection Inject 1,000 mcg into the muscle every 30 (thirty) days. Last injection 06/20/11      . flecainide (TAMBOCOR) 50 MG tablet TAKE ONE TABLET BY MOUTH TWICE DAILY  180 tablet  3  . hydrocortisone-pramoxine (ANALPRAM-HC) 2.5-1 % rectal cream Place 1 application rectally 2 (two) times daily as needed for hemorrhoids.  30 g  1  . metoprolol (TOPROL-XL) 50 MG 24 hr tablet Take 1 tablet (50 mg total) by mouth daily.  30 tablet  6  .  omeprazole (PRILOSEC) 20 MG capsule Take 1 capsule (20 mg total) by mouth daily.  84 capsule  2    OBJECTIVE: Elderly white woman who appears comfortable Filed Vitals:   07/15/11 1306  BP: 120/69  Pulse: 65  Temp: 98.3 F (36.8 C)     Body mass index is 27.28 kg/(m^2).    ECOG FS: 0  Sclerae unicteric Oropharynx clear No peripheral adenopathy Lungs no rales or rhonchi Heart regular rate and rhythm Abd benign MSK no focal spinal tenderness, no peripheral edema Neuro: nonfocal Breasts: Right breast, no suspicious findings, no evidence of local recurrence; left breast status post recent lumpectomy; the incision is healing very nicely. Cosmetic result is excellent. There is induration, no erythema. The left axilla  is clear  LAB RESULTS: Lab Results  Component Value Date   WBC 6.3 07/08/2011   NEUTROABS 3.6 07/08/2011   HGB 13.2 07/08/2011   HCT 38.8 07/08/2011   MCV 92.9 07/08/2011   PLT 165 07/08/2011      Chemistry      Component Value Date/Time   NA 139 07/08/2011 1252   K 4.1 07/08/2011 1252   CL 103 07/08/2011 1252   CO2 24 07/08/2011 1252   BUN 11 07/08/2011 1252   CREATININE 1.05 07/08/2011 1252      Component Value Date/Time   CALCIUM 9.5 07/08/2011 1252   ALKPHOS 80 07/08/2011 1252   AST 19 07/08/2011 1252   ALT 15 07/08/2011 1252   BILITOT 0.8 07/08/2011 1252       Lab Results  Component Value Date   LABCA2 18 10/07/2009    No components found with this basename: [id number]    No results found for this basename: INR:1;PROTIME:1 in the last 168 hours  Urinalysis No results found for this basename: colorurine, appearanceur, labspec, phurine, glucoseu, hgbur, bilirubinur, ketonesur, proteinur, urobilinogen, nitrite, leukocytesur    STUDIES: Dg Chest 2 View  07/01/2011  *RADIOLOGY REPORT*  Clinical Data: Preop  CHEST - 2 VIEW  Comparison: 06/24/2008  Findings: Cardiomediastinal silhouette is stable.  No acute infiltrate or pleural effusion.  No pulmonary edema.  Bony thorax is  stable.  IMPRESSION: No active disease.  No significant change.  Original Report Authenticated By: Natasha Mead, M.D.   Mm Breast Stereo Biopsy Left  06/23/2011  *RADIOLOGY REPORT*  Clinical Data:  Suspicious left breast 12 o'clock location calcifications. History of left lumpectomy for breast cancer with lumpectomy and subsequent chemotherapy but no radiation in 2008. Right lumpectomy for breast cancer 2010.  STEREOTACTIC-GUIDED VACUUM ASSISTED BIOPSY OF THE LEFT BREAST AND SPECIMEN RADIOGRAPH  I met with the patient and we discussed the procedure of stereotactic-guided biopsy, including benefits and alternatives. We discussed the high likelihood of a successful procedure. We discussed the risks of the procedure, including infection, bleeding, tissue injury, clip migration, and inadequate sampling. Informed, written consent was given.  Using sterile technique, 2% lidocaine, stereotactic guidance, and a 9 gauge vacuum assisted device, biopsy was performed of left breast calcifications in the 12 o'clock location using a superior to inferior approach.  Specimen radiograph was performed, showing calcifications in the biopsy samples.  Specimens with calcifications are identified for pathology.  At the conclusion of the procedure, a tissue marker clip was deployed into the biopsy cavity.  Follow-up 2-view mammogram confirmed clip placement at the biopsy site with a T shaped clip.  IMPRESSION: Stereotactic-guided biopsy of left breast calcifications 12 o'clock location, with placement of a T shaped clip.  Pathology is pending. No apparent complications.  Original Report Authenticated By: Harrel Lemon, M.D.   Mm Breast Surgical Specimen  07/02/2011  *RADIOLOGY REPORT*  Clinical Data:  Recently diagnosed left breast atypical lobular hyperplasia.  NEEDLE LOCALIZATION WITH MAMMOGRAPHIC GUIDANCE AND SPECIMEN RADIOGRAPH  The patient presents for needle localization prior to surgical excision of the area of recently  biopsied atypical lobular hyperplasia in the left breast.  I met with the patient and we discussed the procedure of needle localization including risks. Specifically, we discussed the risks of bleeding and infection. Informed written consent was given.  Using mammographic guidance, sterile technique, local anesthesia and a 5 cm localization needle, the recently placed biopsy marker clip in the 12 o'clock position of the left breast was localized using  a superior approach.  The images were marked for Dr. Ezzard Standing.  Specimen radiograph was performed at [facility], and demonstrates the wire tip, biopsy marker clip and several punctate calcifications are present in the tissue sample.  The specimen was marked for pathology.  IMPRESSION: Needle localization left breast.  No apparent complications.  Original Report Authenticated By: Darrol Angel, M.D.   Mm Breast Surgical Specimen  06/23/2011  *RADIOLOGY REPORT*  Clinical Data:  Suspicious left breast 12 o'clock location calcifications. History of left lumpectomy for breast cancer with lumpectomy and subsequent chemotherapy but no radiation in 2008. Right lumpectomy for breast cancer 2010.  STEREOTACTIC-GUIDED VACUUM ASSISTED BIOPSY OF THE LEFT BREAST AND SPECIMEN RADIOGRAPH  I met with the patient and we discussed the procedure of stereotactic-guided biopsy, including benefits and alternatives. We discussed the high likelihood of a successful procedure. We discussed the risks of the procedure, including infection, bleeding, tissue injury, clip migration, and inadequate sampling. Informed, written consent was given.  Using sterile technique, 2% lidocaine, stereotactic guidance, and a 9 gauge vacuum assisted device, biopsy was performed of left breast calcifications in the 12 o'clock location using a superior to inferior approach.  Specimen radiograph was performed, showing calcifications in the biopsy samples.  Specimens with calcifications are identified for  pathology.  At the conclusion of the procedure, a tissue marker clip was deployed into the biopsy cavity.  Follow-up 2-view mammogram confirmed clip placement at the biopsy site with a T shaped clip.  IMPRESSION: Stereotactic-guided biopsy of left breast calcifications 12 o'clock location, with placement of a T shaped clip.  Pathology is pending. No apparent complications.  Original Report Authenticated By: Harrel Lemon, M.D.   Mm Breast Wire Localization Left  07/02/2011  *RADIOLOGY REPORT*  Clinical Data:  Recently diagnosed left breast atypical lobular hyperplasia.  NEEDLE LOCALIZATION WITH MAMMOGRAPHIC GUIDANCE AND SPECIMEN RADIOGRAPH  The patient presents for needle localization prior to surgical excision of the area of recently biopsied atypical lobular hyperplasia in the left breast.  I met with the patient and we discussed the procedure of needle localization including risks. Specifically, we discussed the risks of bleeding and infection. Informed written consent was given.  Using mammographic guidance, sterile technique, local anesthesia and a 5 cm localization needle, the recently placed biopsy marker clip in the 12 o'clock position of the left breast was localized using a superior approach.  The images were marked for Dr. Ezzard Standing.  Specimen radiograph was performed at [facility], and demonstrates the wire tip, biopsy marker clip and several punctate calcifications are present in the tissue sample.  The specimen was marked for pathology.  IMPRESSION: Needle localization left breast.  No apparent complications.  Original Report Authenticated By: Darrol Angel, M.D.   Mm Digital Diagnostic Bilat  06/17/2011  *RADIOLOGY REPORT*  Clinical Data:  Patient presents for a bilateral diagnostic mammogram due to a history of a prior left malignant lumpectomy 2008 and right malignant lumpectomy 2010.  DIGITAL DIAGNOSTIC BILATERAL MAMMOGRAM WITH CAD  Comparison:  06/16/2010, 06/09/2009 and 06/07/2008 as  well as 06/10/2006  Findings:  Exam demonstrates heterogeneously dense fibroglandular tissue.  There are stable post lumpectomy changes of the inferior left breast and outer right breast.  There is a 5 x 5 mm group of microcalcifications of the upper outer quadrant of the left breast at approximately the 12 o'clock position.  These calcifications are coarse and heterogeneous. Mammographic images were processed with CAD.  IMPRESSION: Stable bilateral post lumpectomy changes.  Group  of indeterminate microcalcifications at the 12 o'clock position of the left breast.  Recommendations:  Recommend stereotactic needle biopsy of these indeterminate microcalcifications.  BI-RADS CATEGORY 4:  Suspicious abnormality - biopsy should be considered.  Biopsy scheduled for 06/23/2011 at 09:30 a.m.  Original Report Authenticated By: Elba Barman, M.D.   Mm Radiologist Eval And Mgmt  06/24/2011  *RADIOLOGY REPORT*  ESTABLISHED PATIENT OFFICE VISIT - LEVEL II [id number])  Chief Complaint:  The patient returns for biopsy results. Stereotactic left breast biopsy of calcifications were performed 06/23/2011  History:  History of bilateral breast cancer.  Left breast calcifications were biopsied yesterday.  The patient reports left breast soreness following the biopsy.  Exam:  Steri-Strips and banded are in place.  There is some dried blood at the biopsy site.  No palpable hematoma.  Today, the biopsy site was cleaned with alcohol, antibiotic ointment was placed, and new Steri-Strips were applied.  Assessment and Plan:  Pathology results reveal calcifications and atypical lobular hyperplasia.  Excisional biopsy is suggested. Surgical consultation has been scheduled with the patient's physician, Dr. Ezzard Standing, on Aug 11, 2011.  Original Report Authenticated By: Britta Mccreedy, M.D.    ASSESSMENT: 78 year old [place] woman with history of -  (1) Left lumpectomy in March 2008 for a 1.2 cm, grade 1, invasive ductal carcinoma.  Strongly  ER/PR positive, HER-2/neu negative, with borderline proliferation fraction.  Treated intermittently with tamoxifen.  (2) Right lumpectomy March 2010 for a T1cN0, grade 1, invasive ductal carcinoma, ER strongly positive, PR and HER-2/neu negative, with MIB-1 of 14%.  On Arimidex since April 2010 with excellent tolerance  (3) s/p excision of an area of lobular carcinoma in situ from the left beast 07/02/2011   PLAN: We had a long discussion regarding lobular carcinoma in situ, what it is, and but it isn't. She understands it is really a marker of risk. In that regard, it is of some concern that there was some change in her breast leading to this biopsy. In short her breasts remain active. She is on anastrozole, which certainly would keep the old cancer from coming back; however we do not have data on anastrozole regarding prevention  For that reason I think it would be best to switch her to exemestane. It should be about the same in terms of cost and side effects, but we do have data for prevention in addition to treatment with that particular aromatase inhibitor  I went ahead and wrote her a prescription and asked her to call us if there is any problem otherwise she will return to see [place] again in one year. She will have her mammogram before that visit. She knows to call for any other issues that may develop   Gertude Benito C    07/15/2011

## 2011-07-15 NOTE — Telephone Encounter (Signed)
left voice message to inform the patient of the new date and time of the 2014 appointment

## 2011-07-23 ENCOUNTER — Ambulatory Visit (INDEPENDENT_AMBULATORY_CARE_PROVIDER_SITE_OTHER): Payer: Medicare Other | Admitting: Internal Medicine

## 2011-07-23 DIAGNOSIS — E538 Deficiency of other specified B group vitamins: Secondary | ICD-10-CM

## 2011-07-23 MED ORDER — CYANOCOBALAMIN 1000 MCG/ML IJ SOLN
1000.0000 ug | Freq: Once | INTRAMUSCULAR | Status: AC
  Administered 2011-07-23: 1000 ug via INTRAMUSCULAR

## 2011-08-11 ENCOUNTER — Ambulatory Visit (INDEPENDENT_AMBULATORY_CARE_PROVIDER_SITE_OTHER): Payer: Medicare Other | Admitting: Surgery

## 2011-08-23 ENCOUNTER — Ambulatory Visit (INDEPENDENT_AMBULATORY_CARE_PROVIDER_SITE_OTHER): Payer: Medicare Other | Admitting: Internal Medicine

## 2011-08-23 DIAGNOSIS — E538 Deficiency of other specified B group vitamins: Secondary | ICD-10-CM | POA: Diagnosis not present

## 2011-08-23 MED ORDER — CYANOCOBALAMIN 1000 MCG/ML IJ SOLN
1000.0000 ug | INTRAMUSCULAR | Status: DC
  Administered 2011-08-23 – 2012-03-07 (×6): 1000 ug via INTRAMUSCULAR

## 2011-09-03 ENCOUNTER — Other Ambulatory Visit: Payer: Self-pay | Admitting: Internal Medicine

## 2011-09-03 MED ORDER — OMEPRAZOLE 20 MG PO CPDR: 20.0000 mg | DELAYED_RELEASE_CAPSULE | Freq: Every day | ORAL | Status: DC

## 2011-09-03 NOTE — Telephone Encounter (Signed)
rx sent

## 2011-09-24 ENCOUNTER — Ambulatory Visit (INDEPENDENT_AMBULATORY_CARE_PROVIDER_SITE_OTHER): Payer: Medicare Other | Admitting: Internal Medicine

## 2011-09-24 DIAGNOSIS — E538 Deficiency of other specified B group vitamins: Secondary | ICD-10-CM | POA: Diagnosis not present

## 2011-09-27 DIAGNOSIS — H5710 Ocular pain, unspecified eye: Secondary | ICD-10-CM | POA: Diagnosis not present

## 2011-09-30 ENCOUNTER — Other Ambulatory Visit: Payer: Self-pay | Admitting: *Deleted

## 2011-09-30 MED ORDER — TAMOXIFEN CITRATE 20 MG PO TABS: 20.0000 mg | ORAL_TABLET | Freq: Every day | ORAL | Status: DC

## 2011-09-30 NOTE — Telephone Encounter (Signed)
Per MD review requested for pt to stop exemustane x 4 weeks and then start tamoxifen.  Discussed above with pt with verbalized understanding-

## 2011-10-04 ENCOUNTER — Other Ambulatory Visit: Payer: Self-pay | Admitting: Cardiology

## 2011-10-13 ENCOUNTER — Other Ambulatory Visit: Payer: Self-pay | Admitting: *Deleted

## 2011-10-13 MED ORDER — TAMOXIFEN CITRATE 20 MG PO TABS: 20.0000 mg | ORAL_TABLET | Freq: Every day | ORAL | Status: DC

## 2011-10-25 ENCOUNTER — Ambulatory Visit (INDEPENDENT_AMBULATORY_CARE_PROVIDER_SITE_OTHER): Payer: Medicare Other | Admitting: Internal Medicine

## 2011-10-25 DIAGNOSIS — E538 Deficiency of other specified B group vitamins: Secondary | ICD-10-CM

## 2011-12-03 ENCOUNTER — Ambulatory Visit (INDEPENDENT_AMBULATORY_CARE_PROVIDER_SITE_OTHER): Payer: Medicare Other | Admitting: Internal Medicine

## 2011-12-03 DIAGNOSIS — H01009 Unspecified blepharitis unspecified eye, unspecified eyelid: Secondary | ICD-10-CM | POA: Diagnosis not present

## 2011-12-03 DIAGNOSIS — E538 Deficiency of other specified B group vitamins: Secondary | ICD-10-CM | POA: Diagnosis not present

## 2011-12-13 ENCOUNTER — Other Ambulatory Visit: Payer: Self-pay | Admitting: Cardiology

## 2011-12-14 ENCOUNTER — Ambulatory Visit (INDEPENDENT_AMBULATORY_CARE_PROVIDER_SITE_OTHER): Payer: Medicare Other | Admitting: Internal Medicine

## 2011-12-14 ENCOUNTER — Other Ambulatory Visit (INDEPENDENT_AMBULATORY_CARE_PROVIDER_SITE_OTHER): Payer: Medicare Other

## 2011-12-14 ENCOUNTER — Encounter: Payer: Self-pay | Admitting: Internal Medicine

## 2011-12-14 VITALS — BP 122/72 | HR 58 | Temp 97.5°F | Resp 14 | Wt 150.2 lb

## 2011-12-14 DIAGNOSIS — E119 Type 2 diabetes mellitus without complications: Secondary | ICD-10-CM

## 2011-12-14 DIAGNOSIS — Z853 Personal history of malignant neoplasm of breast: Secondary | ICD-10-CM

## 2011-12-14 DIAGNOSIS — E039 Hypothyroidism, unspecified: Secondary | ICD-10-CM

## 2011-12-14 DIAGNOSIS — Z23 Encounter for immunization: Secondary | ICD-10-CM

## 2011-12-14 DIAGNOSIS — I4949 Other premature depolarization: Secondary | ICD-10-CM

## 2011-12-14 LAB — HEMOGLOBIN A1C: Hgb A1c MFr Bld: 7.6 % — ABNORMAL HIGH (ref 4.6–6.5)

## 2011-12-14 MED ORDER — METFORMIN HCL ER 500 MG PO TB24: 500.0000 mg | ORAL_TABLET | Freq: Every day | ORAL | Status: DC

## 2011-12-14 NOTE — Progress Notes (Signed)
Subjective:    Patient ID: Tami Jacobson, female    DOB: 1927-12-25, 78 y.o.   MRN: [id number]  HPI  here for follow up - reviewed chronic medical issues   breast cancer - initial dx 3/08 on left, recurrent left 3/13 LCIS; also right side 3/10 - changed armidex to tamoxifen for prevention 4/13 following lumpectomy - s/p XRT in 2010 - follows with onc regularly - mammo at breast center -   HTN - reports compliance with ongoing medical treatment and no changes in medication dose or frequency. denies adverse side effects related to current therapy. symptoms of PVCs managed with flecanide   GERD with B12 defic - follows closely with GI - regular shots for B12 replacment and takes PPI once daily - reports compliance with ongoing medical treatment and no changes in medication dose or frequency. denies adverse side effects related to current therapy.     hx hypothyroid - on synthroid > 61yr but none in past 6-2yr - denies skin or weight changes , no depression or mood changes- normal TSH last few years   dyslipidemia - intol of all statins - no specific med tx at this time - tries to monitor with diet and exercise, esp with spouse's DM diet   left ear and head pain summer 2011- mri brain unremarkable - following with ent - tube in ear by ent and PT -  no recurrence   DM2 - diet controlled - infreq home cbg check - reports compliance with ongoing medical treatment and no changes in medication dose or frequency. denies adverse side effects related to current therapy.      Past Medical History  Diagnosis Date  . Vitamin B12 deficiency   . Carcinoma in situ of breast     R 06/2006, L 06/2008, L 06/2011 LCIS  . Esophageal stricture   . Diverticulosis   . Hypothyroid   . Hyperlipidemia     intol of all statins  . Anemia   . Gastritis   . Alopecia   . Carotid bruit   . Premature ventricular contraction   . PAC (premature atrial contraction)   . Diabetes mellitus     controlled by diet  .  GERD (gastroesophageal reflux disease)   . Arthritis     Review of Systems  Constitutional: Negative for unexpected weight change.  Respiratory: Negative for shortness of breath.   Cardiovascular: Negative for chest pain.  Neurological: Negative for headaches.       Objective:   Physical Exam  BP 122/72  Pulse 58  Temp 97.5 F (36.4 C) (Oral)  Resp 14  Wt 150 lb 4 oz (68.153 kg)  SpO2 95% Wt Readings from Last 3 Encounters:  12/14/11 150 lb 4 oz (68.153 kg)  07/15/11 154 lb (69.854 kg)  07/14/11 152 lb 9.6 oz (69.219 kg)   Constitutional: She appears well-developed and well-nourished. No distress. spouse at side Neck: Normal range of motion. Neck supple. No JVD present. No thyromegaly present.  Cardiovascular: Normal rate, regular rhythm and normal heart sounds.  No murmur heard. No BLE edema. Pulmonary/Chest: Effort normal and breath sounds normal. No respiratory distress. She has no wheezes.  Skin: Skin is warm and dry. No rash noted. No erythema.  Psychiatric: She has a normal mood and affect. Her behavior is normal. Judgment and thought content normal.   Lab Results  Component Value Date   WBC 6.3 07/08/2011   HGB 13.2 07/08/2011   HCT 38.8 07/08/2011  PLT 165 07/08/2011   CHOL 200 06/15/2011   TRIG 131.0 06/15/2011   HDL 48.30 06/15/2011   LDLDIRECT 136.1 01/02/2007   ALT 15 07/08/2011   AST 19 07/08/2011   NA 139 07/08/2011   K 4.1 07/08/2011   CL 103 07/08/2011   CREATININE 1.05 07/08/2011   BUN 11 07/08/2011   CO2 24 07/08/2011   TSH 1.86 12/15/2010   HGBA1C 7.0* 06/15/2011      Assessment & Plan:  See problem list. Medications and labs reviewed today.

## 2011-12-14 NOTE — Patient Instructions (Signed)
It was good to see you today. Medications reviewed and updated, no changes at this time. Test(s) ordered today. Your results will be called to you after review (48-72hours after test completion). If any changes need to be made, you will be notified at that time. Flu shot done today Please schedule followup in 6 months, call sooner if problems.

## 2011-12-14 NOTE — Addendum Note (Signed)
Addended by: Rene Paci A on: 12/14/2011 02:20 PM   Modules accepted: Orders

## 2011-12-14 NOTE — Assessment & Plan Note (Signed)
(  1) Left lumpectomy in March 2008 for a 1.2 cm, grade 1, invasive ductal carcinoma. Strongly ER/PR positive, HER-2/neu negative, with borderline proliferation fraction. Treated intermittently with tamoxifen.  (2) Right lumpectomy March 2010 for a T1cN0, grade 1, invasive ductal carcinoma, ER strongly positive, PR and HER-2/neu negative, with MIB-1 of 14%. On Arimidex since April 2010 with excellent tolerance  (3) s/p excision of an area of lobular carcinoma in situ from the left beast 07/02/2011  Changed Armidex to Tamoxifen 07/2011 for prophylaxis Continue following with onc and surg as ongoing - reviewed interval medical treatments

## 2011-12-14 NOTE — Assessment & Plan Note (Signed)
Diet controlled - check a1c and monitor same, tx as ongoing Lab Results  Component Value Date   HGBA1C 7.0* 06/15/2011

## 2011-12-14 NOTE — Assessment & Plan Note (Signed)
Controlled PVCs and palpitations on beta-blocker and flecainide -  continue same and follow with cards annually

## 2011-12-14 NOTE — Assessment & Plan Note (Signed)
Hx same, not on med replacement for years Continue to monitor TSH today and semiannually, sooner if symptoms Lab Results  Component Value Date   TSH 1.86 12/15/2010

## 2011-12-15 ENCOUNTER — Telehealth: Payer: Self-pay | Admitting: *Deleted

## 2011-12-15 ENCOUNTER — Encounter (INDEPENDENT_AMBULATORY_CARE_PROVIDER_SITE_OTHER): Payer: Self-pay | Admitting: Surgery

## 2011-12-15 ENCOUNTER — Ambulatory Visit (INDEPENDENT_AMBULATORY_CARE_PROVIDER_SITE_OTHER): Payer: Medicare Other | Admitting: Surgery

## 2011-12-15 VITALS — BP 120/78 | HR 60 | Temp 98.2°F | Resp 18 | Wt 149.4 lb

## 2011-12-15 DIAGNOSIS — Z853 Personal history of malignant neoplasm of breast: Secondary | ICD-10-CM

## 2011-12-15 DIAGNOSIS — N6019 Diffuse cystic mastopathy of unspecified breast: Secondary | ICD-10-CM

## 2011-12-15 NOTE — Telephone Encounter (Signed)
Patient arrived to office. She states she has had a problem with her vaginal/rectal area for 2 weeks. It started as itching and redness at the vaginal area and now is red and painful in the rectum.  She has done sitz baths and used Analpram cream without relief. Denies any discharge.  States usually the cream and sitz baths get rid of the problem.She also reports she has recently been told she is a diabetic. Offered OV this afternoon with Dr. Juanda Chance or Willette Cluster, NP but patient would prefer an OV tomorrow AM. Scheduled her at 9:30 tomorrow with Willette Cluster, NP.

## 2011-12-15 NOTE — Progress Notes (Signed)
[facility]  Ovidio Kin, MD,  FACS [address] [place]  [address] [place], [place]    [address] Phone:  [phone] FAX:  [phone]   Re:   Tami Jacobson DOB:   1927-12-31 MRN:   [id number]  ASSESSMENT AND PLAN: 1.  T1c, N0 right breast cancer.  Lumpectomy 06/26/2008.  ER - positive, PR - negative, Ki67 - 14%.  Her2Neu - neg  Dr. Darnelle Catalan and Kathrynn Running treating oncologist.  Switched to Tamoxifen 2013  Tolerating this well.  Disease free.  She will see me back in 6 months.   2.  T1c, N0 left breast cancer.  Lumpectomy 06/24/2006.  Dr. Cindee Lame did this surgery.  ER/PR positive.  Ki67 - 12%.  Her2Neu - neg.  3.  Left breast atypical lobular hyperplasia.  Open biopsy by me - 07/02/2011 - shows atypical lobular neoplasia, no cancerous changes.  4.  Numbness right axilla. 5.  Left ear complaint/vertgo.  She saw Dr. Salena Saner. Tami Jacobson.  This is now better. 6.  Hypertension. 7.  GERD. 8.  Hypothyroid - replacement. 9.  Diabetes mellitus - 2013.9 - just started on Metformin  HISTORY OF PRESENT ILLNESS: Tami Jacobson is a 78 y.o. (DOB: [birth year])  white female who is a patient of Rene Paci, MD and comes to me today for follow up for bilateral breast cancer.    She is doing well.  She was recently diagnosed with diabetes by Dr. Felicity Coyer and is started on Metformin.  She has also been switched from Arimidex to Tamoxifen that she says she will take for 10 years.  She has occasional breast soreness, but otherwise is okay.  PHYSICAL EXAM: BP 120/78  Pulse 60  Temp 98.2 F (36.8 C) (Oral)  Resp 18  Wt 149 lb 6 oz (67.756 kg)  HEENT:  Pupils equal.  Dentition good. NECK:  Supple.  No thyroid mass. LYMPH NODES:  No cervical, supraclavicular, or axillary adenopathy. BREASTS -  RIGHT:  No palpable mass or nodule.  No nipple discharge. Right nipple inverted.   LEFT:  Incision at 12 - 1 o'clock looks good. UPPER EXTREMITIES:  No evidence of lymphedema.  DATA  REVIEWED: None new.  Ovidio Kin, MD, FACS Office:  [phone]

## 2011-12-16 ENCOUNTER — Ambulatory Visit (INDEPENDENT_AMBULATORY_CARE_PROVIDER_SITE_OTHER): Payer: Medicare Other | Admitting: Nurse Practitioner

## 2011-12-16 DIAGNOSIS — K602 Anal fissure, unspecified: Secondary | ICD-10-CM | POA: Diagnosis not present

## 2011-12-16 NOTE — Progress Notes (Signed)
12/16/2011 RIVER MCKERCHER [id number] 1927/10/26   History of Present Illness:  Patient is an 78 year old female known to Dr. Juanda Chance for history of diverticulosis and a family history of colon cancer.  Colonoscopy by Dr. Juanda Chance August 2011 revealed moderate diverticulosis, luminal narrowing of the sigmoid.Exam otherwise normal to the cecum. Patient comes in today for evaluation of perianal pain, especially with bowel movements.. Bowel movements are normal. No rectal bleeding. No abdominal pain.   Current Medications, Allergies, Past Medical History, Past Surgical History, Family History and Social History were reviewed in [employer] record.   Physical Exam: General: Well developed , white female in no acute distress Head: Normocephalic and atraumatic Eyes:  sclerae anicteric, conjunctiva pink  Ears: Normal auditory acuity Lungs: Clear throughout to auscultation Heart: Regular rate and rhythm. Soft murmur heard. Abdomen: Soft, non tender and non distended. Small umbilical hernia. No masses, hepatomegaly or hernias noted. Normal Bowel sounds Rectal: anterior midline with mild edema and erythema.  Small amount of blood on Q-tip after inserting it just inside anus at anterior midline. Neurological: Alert oriented x 4, grossly nonfocal Psychological:  Alert and cooperative. Normal mood and affect  Assessment and Recommendations:   perianal pain. Suspect small anterior midilne fissure. Trial of Diltiazem gel BID for 3 weeks  Will give her Xylocaine jelly for local pain relief. Patient will call our office in 2-3 weeks if no improvement, or sooner, for worsening symptoms. Avoid constipation / straining

## 2011-12-16 NOTE — Patient Instructions (Addendum)
We called in a prescription for Diltiazem gel 2 %.  Use small amount rectally twice daily. This will help heal the fissure. You can use over the counter Balneol lotions cleanser after a bowel movement. We have given you information on Recticare Cream, over the counter. You can get that at most pharmacies. This will help with the pain, it has numbing properties in it. Avoid straining. Use a stool softner if you need to. Call our office if you don't see improvement.

## 2011-12-17 ENCOUNTER — Encounter: Payer: Self-pay | Admitting: Nurse Practitioner

## 2011-12-20 NOTE — Progress Notes (Signed)
Reviewed and agree with management plan. Breton Berns T. Yakelin Grenier MD FACG 

## 2012-01-03 ENCOUNTER — Ambulatory Visit (INDEPENDENT_AMBULATORY_CARE_PROVIDER_SITE_OTHER): Payer: Medicare Other | Admitting: Internal Medicine

## 2012-01-03 DIAGNOSIS — L821 Other seborrheic keratosis: Secondary | ICD-10-CM | POA: Diagnosis not present

## 2012-01-03 DIAGNOSIS — E538 Deficiency of other specified B group vitamins: Secondary | ICD-10-CM | POA: Diagnosis not present

## 2012-01-03 DIAGNOSIS — L570 Actinic keratosis: Secondary | ICD-10-CM | POA: Diagnosis not present

## 2012-01-03 MED ORDER — CYANOCOBALAMIN 1000 MCG/ML IJ SOLN
1000.0000 ug | INTRAMUSCULAR | Status: DC
  Administered 2012-01-03: 1000 ug via INTRAMUSCULAR

## 2012-01-07 DIAGNOSIS — H01009 Unspecified blepharitis unspecified eye, unspecified eyelid: Secondary | ICD-10-CM | POA: Diagnosis not present

## 2012-02-03 ENCOUNTER — Ambulatory Visit (INDEPENDENT_AMBULATORY_CARE_PROVIDER_SITE_OTHER): Payer: Medicare Other | Admitting: Internal Medicine

## 2012-02-03 DIAGNOSIS — E538 Deficiency of other specified B group vitamins: Secondary | ICD-10-CM | POA: Diagnosis not present

## 2012-02-07 ENCOUNTER — Encounter: Payer: Self-pay | Admitting: Internal Medicine

## 2012-02-07 DIAGNOSIS — Z961 Presence of intraocular lens: Secondary | ICD-10-CM | POA: Diagnosis not present

## 2012-02-07 DIAGNOSIS — E119 Type 2 diabetes mellitus without complications: Secondary | ICD-10-CM | POA: Diagnosis not present

## 2012-02-07 DIAGNOSIS — H52209 Unspecified astigmatism, unspecified eye: Secondary | ICD-10-CM | POA: Diagnosis not present

## 2012-02-07 DIAGNOSIS — H01009 Unspecified blepharitis unspecified eye, unspecified eyelid: Secondary | ICD-10-CM | POA: Diagnosis not present

## 2012-02-09 ENCOUNTER — Ambulatory Visit (INDEPENDENT_AMBULATORY_CARE_PROVIDER_SITE_OTHER): Payer: Medicare Other

## 2012-02-09 DIAGNOSIS — Z23 Encounter for immunization: Secondary | ICD-10-CM | POA: Diagnosis not present

## 2012-02-15 ENCOUNTER — Other Ambulatory Visit: Payer: Self-pay | Admitting: *Deleted

## 2012-02-15 ENCOUNTER — Other Ambulatory Visit: Payer: Self-pay | Admitting: Internal Medicine

## 2012-02-15 MED ORDER — OMEPRAZOLE 20 MG PO CPDR: 20.0000 mg | DELAYED_RELEASE_CAPSULE | Freq: Every day | ORAL | Status: DC

## 2012-02-15 NOTE — Telephone Encounter (Signed)
rx sent to mail in pharmacy. Rx sent to [facility] has been discontinued.

## 2012-03-06 DIAGNOSIS — M722 Plantar fascial fibromatosis: Secondary | ICD-10-CM | POA: Diagnosis not present

## 2012-03-07 ENCOUNTER — Ambulatory Visit (INDEPENDENT_AMBULATORY_CARE_PROVIDER_SITE_OTHER): Payer: Medicare Other | Admitting: Internal Medicine

## 2012-03-07 DIAGNOSIS — E538 Deficiency of other specified B group vitamins: Secondary | ICD-10-CM | POA: Diagnosis not present

## 2012-03-14 ENCOUNTER — Other Ambulatory Visit: Payer: Self-pay | Admitting: Internal Medicine

## 2012-03-14 ENCOUNTER — Other Ambulatory Visit (INDEPENDENT_AMBULATORY_CARE_PROVIDER_SITE_OTHER): Payer: Medicare Other

## 2012-03-14 ENCOUNTER — Telehealth: Payer: Self-pay | Admitting: *Deleted

## 2012-03-14 DIAGNOSIS — E119 Type 2 diabetes mellitus without complications: Secondary | ICD-10-CM

## 2012-03-14 NOTE — Telephone Encounter (Signed)
Dr Juanda Chance- Patient last got rx for analpram in 2012. She saw Willette Cluster, NP recently and it appears that patient was told to you diltiazem gel and recticare cream. She was to call back if no improvement in symptoms. Do you want me to refill analpram in addition to the diltiazem and recticare she is already supposed to be taking?

## 2012-03-14 NOTE — Telephone Encounter (Signed)
Pt in lab requesting blood work no order in comp. Reviewed chart pt had A1c check back in sept was elevated md started her on metformin & was told to have a1c check again in 3 months. Inform anita will put in a1c...lmb

## 2012-03-14 NOTE — Telephone Encounter (Signed)
Stop Diltiazem ointment and refill Analpram 2.5 % cream, apply tid

## 2012-03-15 MED ORDER — HYDROCORTISONE ACE-PRAMOXINE 2.5-1 % RE CREA: 1.0000 "application " | TOPICAL_CREAM | Freq: Three times a day (TID) | RECTAL | Status: DC

## 2012-03-15 NOTE — Telephone Encounter (Signed)
Patient states that she has not been using the Diliazem cream. She has been using the Analpram and states that she is doing much better. New analpram rx sent to local pharmacy.

## 2012-04-07 ENCOUNTER — Ambulatory Visit (INDEPENDENT_AMBULATORY_CARE_PROVIDER_SITE_OTHER): Payer: Medicare Other | Admitting: Internal Medicine

## 2012-04-07 DIAGNOSIS — E538 Deficiency of other specified B group vitamins: Secondary | ICD-10-CM | POA: Diagnosis not present

## 2012-04-10 ENCOUNTER — Encounter: Payer: Self-pay | Admitting: Cardiology

## 2012-04-10 ENCOUNTER — Ambulatory Visit (INDEPENDENT_AMBULATORY_CARE_PROVIDER_SITE_OTHER): Payer: Medicare Other | Admitting: Cardiology

## 2012-04-10 VITALS — BP 115/60 | HR 63 | Ht 63.0 in | Wt 144.8 lb

## 2012-04-10 DIAGNOSIS — R002 Palpitations: Secondary | ICD-10-CM | POA: Diagnosis not present

## 2012-04-10 MED ORDER — FLECAINIDE ACETATE 50 MG PO TABS: 50.0000 mg | ORAL_TABLET | Freq: Two times a day (BID) | ORAL | Status: DC

## 2012-04-10 NOTE — Assessment & Plan Note (Signed)
Patient's palpitations continued to be controlled. Continue beta blocker. We discussed discontinuing flecainide but she is concerned that her palpitations will worsen and we will therefore continue.

## 2012-04-10 NOTE — Assessment & Plan Note (Signed)
Blood pressure controlled. Continue present medications. 

## 2012-04-10 NOTE — Progress Notes (Signed)
HPI: Tami Jacobson is a pleasant female who has a history of PACs and PVCs with normal LV function. She had significant palpitations in the past despite beta-blockade. She is therefore on flecainide 50 mg p.o. b.i.d. A previous catheterization in June 2000 showed normal coronary arteries and normal LV function. Last Myoview in October of 2004 showed an ejection fraction of 70% with apical thinning and probable normal perfusion. Last echocardiogram in December of 2008 showed normal LV function with an ejection fraction of 70-75%, trivial mitral regurgitation and tricuspid regurgitation. Carotid Dopplers in July of 2012 showed 0-39% bilateral stenosis. I last saw her in November of 2012. Since then the patient has dyspnea with more extreme activities but not with routine activities. It is relieved with rest. It is not associated with chest pain. There is no orthopnea, PND or pedal edema. There is no syncope or palpitations. There is no exertional chest pain.    Current Outpatient Prescriptions  Medication Sig Dispense Refill  . clobetasol cream (TEMOVATE) 0.05 % 1 application as needed. Irritated skin      . cyanocobalamin (,VITAMIN B-12,) 1000 MCG/ML injection Inject 1,000 mcg into the muscle every 30 (thirty) days. Last injection 06/20/11      . flecainide (TAMBOCOR) 50 MG tablet       . hydrocortisone-pramoxine (ANALPRAM-HC) 2.5-1 % rectal cream Place 1 application rectally 3 (three) times daily.  30 g  1  . metFORMIN (GLUCOPHAGE XR) 500 MG 24 hr tablet Take 1 tablet (500 mg total) by mouth daily with breakfast.  90 tablet  3  . metoprolol succinate (TOPROL-XL) 50 MG 24 hr tablet TAKE ONE TABLET BY MOUTH EVERY DAY  30 tablet  12  . omeprazole (PRILOSEC) 20 MG capsule Take 1 capsule (20 mg total) by mouth daily.  180 capsule  0  . tamoxifen (NOLVADEX) 20 MG tablet          Past Medical History  Diagnosis Date  . Vitamin B12 deficiency   . Carcinoma in situ of breast     R 06/2006, L 06/2008, L  06/2011 LCIS  . Esophageal stricture   . Diverticulosis   . Hypothyroid   . Hyperlipidemia     intol of all statins  . Anemia   . Gastritis   . Alopecia   . Carotid bruit   . Premature ventricular contraction   . PAC (premature atrial contraction)   . Diabetes mellitus     controlled by diet  . GERD (gastroesophageal reflux disease)   . Arthritis     Past Surgical History  Procedure Date  . Abdominal hysterectomy 1979  . Appendectomy 2002  . Cholecystectomy 2002  . Tonsillectomy 1942  . Esophageal dilation 2006  . Breast lumpectomy 2008, 2010    X 2  . Cataract extraction   . Breast biopsy 07/02/2011    Procedure: BREAST BIOPSY WITH NEEDLE LOCALIZATION;  Surgeon: Kandis Cocking, MD;  Location: [facility] OR;  Service: General;  Laterality: Left;  left breast atypical hyperplasia needle localization biopsy    History   Social History  . Marital Status: Married    Spouse Name: N/A    Number of Children: N/A  . Years of Education: N/A   Occupational History  . Not on file.   Social History Main Topics  . Smoking status: Never Smoker   . Smokeless tobacco: Never Used  . Alcohol Use: No  . Drug Use: No  . Sexually Active: Not on file  Other Topics Concern  . Not on file   Social History Narrative   Retired Museum/gallery curator, lives with spouse    ROS: no fevers or chills, productive cough, hemoptysis, dysphasia, odynophagia, melena, hematochezia, dysuria, hematuria, rash, seizure activity, orthopnea, PND, pedal edema, claudication. Remaining systems are negative.  Physical Exam: Well-developed well-nourished in no acute distress.  Skin is warm and dry.  HEENT is normal.  Neck is supple.  Chest is clear to auscultation with normal expansion.  Cardiovascular exam is regular rate and rhythm.  Abdominal exam nontender or distended. No masses palpated. Extremities show no edema. neuro grossly intact  ECG sinus rhythm at a rate of 63. Left axis deviation. No ST  changes.

## 2012-04-10 NOTE — Patient Instructions (Addendum)
The current medical regimen is effective;  continue present plan and medications.  Follow up in 1 year with Dr Jens Som.  You will receive a letter in the mail 2 months before you are due.  Please call us when you receive this letter to schedule your follow up appointment.

## 2012-04-20 DIAGNOSIS — L940 Localized scleroderma [morphea]: Secondary | ICD-10-CM | POA: Diagnosis not present

## 2012-04-20 DIAGNOSIS — B373 Candidiasis of vulva and vagina: Secondary | ICD-10-CM | POA: Diagnosis not present

## 2012-05-11 ENCOUNTER — Telehealth: Payer: Self-pay | Admitting: *Deleted

## 2012-05-11 MED ORDER — RANITIDINE HCL 150 MG PO TABS: ORAL_TABLET | ORAL | Status: DC

## 2012-05-11 NOTE — Telephone Encounter (Signed)
Spoke with patient and gave her Dr. Regino Schultze recommendations. Rx sent to mail order pharmacy for Ranitidine.

## 2012-05-11 NOTE — Telephone Encounter (Signed)
I don't know about Merformin but Prilosec may decrease B12 absorption over period of time. She is on it because she had an esophageal stricture in 2009. We could switch her to Ranitidine 150mg , #30, 1 po qd, 3 refills  In place of Prilosec,hoping it will control her reflux. DB

## 2012-05-11 NOTE — Telephone Encounter (Signed)
Please see my response in the same telephone message.

## 2012-05-11 NOTE — Telephone Encounter (Signed)
Left a message for patient to call me. 

## 2012-05-11 NOTE — Telephone Encounter (Signed)
Spoke with patient and she will come for labs.  Patient states she has read some articles that Prilosec and Metformin used together can cause B 12 to be low. She wants to know if this could be causing her problem. Please, advise.

## 2012-05-11 NOTE — Addendum Note (Signed)
Addended by: Daphine Deutscher on: 05/11/2012 01:59 PM   Modules accepted: Orders

## 2012-05-11 NOTE — Telephone Encounter (Signed)
Message copied by Daphine Deutscher on Thu May 11, 2012  8:26 AM ------      Message from: Daphine Deutscher      Created: Mon May 24, 2011  3:50 PM       Call and remind patient Vit b 12 level due for db on 05/15/12

## 2012-05-11 NOTE — Telephone Encounter (Signed)
Spoke with patient and she will come for labs.  Patient states she has read some articles that Prilosec and Metformin used together can cause B 12 to be low. She wants to know if this could be causing her problem. Please, advise. 

## 2012-05-12 ENCOUNTER — Ambulatory Visit (INDEPENDENT_AMBULATORY_CARE_PROVIDER_SITE_OTHER): Payer: Medicare Other | Admitting: Internal Medicine

## 2012-05-12 ENCOUNTER — Other Ambulatory Visit (INDEPENDENT_AMBULATORY_CARE_PROVIDER_SITE_OTHER): Payer: Medicare Other

## 2012-05-12 DIAGNOSIS — E538 Deficiency of other specified B group vitamins: Secondary | ICD-10-CM

## 2012-05-12 MED ORDER — CYANOCOBALAMIN 1000 MCG/ML IJ SOLN
1000.0000 ug | Freq: Once | INTRAMUSCULAR | Status: AC
  Administered 2012-05-12: 1000 ug via INTRAMUSCULAR

## 2012-05-29 ENCOUNTER — Telehealth: Payer: Self-pay | Admitting: Internal Medicine

## 2012-05-29 ENCOUNTER — Telehealth: Payer: Self-pay

## 2012-05-29 ENCOUNTER — Other Ambulatory Visit: Payer: Self-pay | Admitting: Oncology

## 2012-05-29 MED ORDER — OMEPRAZOLE 20 MG PO CPDR: 20.0000 mg | DELAYED_RELEASE_CAPSULE | Freq: Two times a day (BID) | ORAL | Status: DC

## 2012-05-29 MED ORDER — OMEPRAZOLE 20 MG PO CPDR: 20.0000 mg | DELAYED_RELEASE_CAPSULE | Freq: Every day | ORAL | Status: DC

## 2012-05-29 NOTE — Telephone Encounter (Signed)
Pt states that her insurance co will not cover ranitadine unless the pt is taking plavix. Pt wants a script sent to the pharmacy for the prilosec that she was taking. Pt requests mail order script be sent for 84 tablets. Script sent to the pharmacy.

## 2012-05-29 NOTE — Addendum Note (Signed)
Addended by: Selinda Michaels R on: 05/29/2012 03:21 PM   Modules accepted: Orders

## 2012-05-29 NOTE — Telephone Encounter (Signed)
Pt wanted to know her B12 results. Reviewed results with pt and let her know they are normal. Pt also asked about the ranitadine script she had asked for. Let pt know she may want to call her mail order pharmacy because the script was sent to them 05/11/12. Pt verbalized understanding.

## 2012-05-30 ENCOUNTER — Telehealth: Payer: Self-pay | Admitting: Internal Medicine

## 2012-05-30 ENCOUNTER — Other Ambulatory Visit: Payer: Self-pay | Admitting: *Deleted

## 2012-05-30 NOTE — Telephone Encounter (Signed)
Spoke with patient and she states she went to [facility] and they had her prescription for Prilosec. She wants to know if it was sent to her mail order. Told patient it was sent for 84 tablets with 3 refills. Patient will call her mail order to see if they have sent to medication.

## 2012-06-14 ENCOUNTER — Encounter: Payer: Self-pay | Admitting: Internal Medicine

## 2012-06-14 ENCOUNTER — Other Ambulatory Visit (INDEPENDENT_AMBULATORY_CARE_PROVIDER_SITE_OTHER): Payer: Medicare Other

## 2012-06-14 ENCOUNTER — Ambulatory Visit (INDEPENDENT_AMBULATORY_CARE_PROVIDER_SITE_OTHER): Payer: Medicare Other | Admitting: Internal Medicine

## 2012-06-14 ENCOUNTER — Telehealth: Payer: Self-pay | Admitting: *Deleted

## 2012-06-14 VITALS — BP 130/68 | HR 64 | Temp 97.6°F | Wt 141.4 lb

## 2012-06-14 DIAGNOSIS — K219 Gastro-esophageal reflux disease without esophagitis: Secondary | ICD-10-CM | POA: Diagnosis not present

## 2012-06-14 DIAGNOSIS — Z0000 Encounter for general adult medical examination without abnormal findings: Secondary | ICD-10-CM | POA: Diagnosis not present

## 2012-06-14 DIAGNOSIS — I10 Essential (primary) hypertension: Secondary | ICD-10-CM | POA: Diagnosis not present

## 2012-06-14 DIAGNOSIS — E119 Type 2 diabetes mellitus without complications: Secondary | ICD-10-CM | POA: Diagnosis not present

## 2012-06-14 DIAGNOSIS — E538 Deficiency of other specified B group vitamins: Secondary | ICD-10-CM | POA: Diagnosis not present

## 2012-06-14 LAB — BASIC METABOLIC PANEL
BUN: 13 mg/dL (ref 6–23)
Calcium: 9.5 mg/dL (ref 8.4–10.5)
GFR: 62.8 mL/min (ref >60.00)
Glucose, Bld: 134 mg/dL — ABNORMAL HIGH (ref 70–99)
Sodium: 143 mEq/L (ref 135–145)

## 2012-06-14 LAB — MICROALBUMIN / CREATININE URINE RATIO
Microalb Creat Ratio: 0.8 mg/g (ref 0.0–30.0)
Microalb, Ur: 0.9 mg/dL (ref 0.0–1.9)

## 2012-06-14 MED ORDER — CYANOCOBALAMIN 1000 MCG/ML IJ SOLN
1000.0000 ug | Freq: Once | INTRAMUSCULAR | Status: AC
  Administered 2012-06-14: 1000 ug via INTRAMUSCULAR

## 2012-06-14 MED ORDER — FAMOTIDINE 20 MG PO TABS: 20.0000 mg | ORAL_TABLET | Freq: Two times a day (BID) | ORAL | Status: DC

## 2012-06-14 NOTE — Progress Notes (Signed)
Subjective:    Patient ID: Tami Jacobson, female    DOB: May 21, 1927, 79 y.o.   MRN: [id number]  HPI  Here for medicare wellness  Diet: heart healthy and diabetic  Physical activity: sedentary Depression/mood screen: negative Hearing: intact to whispered voice Visual acuity: grossly normal, performs annual eye exam  ADLs: capable Fall risk: none Home safety: good Cognitive evaluation: intact to orientation, naming, recall and repetition EOL planning: adv directives, full code/ I agree  I have personally reviewed and have noted 1. The patient's medical and social history 2. Their use of alcohol, tobacco or illicit drugs 3. Their current medications and supplements 4. The patient's functional ability including ADL's, fall risks, home safety risks and hearing or visual impairment. 5. Diet and physical activities 6. 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 denies adverse side effects related to current therapy. symptoms of PVCs managed with flecanide   GERD with B12 defic - follows closely with GI - regular shots for B12 replacment and takes PPI once daily - reports compliance with ongoing medical treatment and no changes in medication dose or frequency. denies adverse side effects related to current therapy.     hx hypothyroid - on synthroid > 83yr but none in past 6-38yr - denies skin or weight changes , no depression or mood changes- normal TSH last few years   dyslipidemia - intol of all statins - no specific med tx at this time - tries to monitor with diet and exercise, esp with spouse's DM  diet   left ear and head pain summer 2011- mri brain unremarkable - following with ent - tube in ear by ent and PT -  no recurrence   DM2, began metformin 12/2011 - infreq home cbg check - reports compliance with ongoing medical treatment and no changes in medication dose or frequency. denies adverse side effects related to current therapy.      Past Medical History  Diagnosis Date  . Vitamin B12 deficiency   . Carcinoma in situ of breast     R 06/2006, L 06/2008, L 06/2011 LCIS  . Esophageal stricture   . Diverticulosis   . Hypothyroid   . Hyperlipidemia     intol of all statins  . Anemia   . Gastritis   . Alopecia   . Carotid bruit   . Premature ventricular contraction   . PAC (premature atrial contraction)   . Diabetes mellitus     controlled by diet  . GERD (gastroesophageal reflux disease)   . Arthritis     Review of Systems  Constitutional: Positive for fatigue. Negative for fever and unexpected weight change.  Respiratory: Negative for shortness of breath and wheezing.   Cardiovascular: Negative for chest pain and palpitations.  Neurological: Negative for headaches.  No other specific complaints in a complete review of systems (except as listed in HPI above).      Objective:   Physical Exam  BP 130/68  Pulse 64  Temp(Src) 97.6 F (36.4 C) (Oral)  Wt 141 lb 6.4 oz (64.139 kg)  BMI 25.05 kg/m2  SpO2 97% Wt Readings  from Last 3 Encounters:  06/14/12 141 lb 6.4 oz (64.139 kg)  04/10/12 144 lb 12.8 oz (65.681 kg)  12/15/11 149 lb 6 oz (67.756 kg)   Constitutional: She appears well-developed and well-nourished. No distress. spouse at side Neck: Normal range of motion. Neck supple. No JVD present. No thyromegaly present.  Cardiovascular: Normal rate, regular rhythm and normal heart sounds. freq PVC appeciated  No murmur heard. No BLE edema. Pulmonary/Chest: Effort normal and breath sounds normal. No respiratory distress. She has no wheezes.  Skin: Skin is warm and  dry. No rash noted. No erythema.  Psychiatric: She has a normal mood and affect. Her behavior is normal. Judgment and thought content normal.   Lab Results  Component Value Date   WBC 6.3 07/08/2011   HGB 13.2 07/08/2011   HCT 38.8 07/08/2011   PLT 165 07/08/2011   CHOL 200 06/15/2011   TRIG 131.0 06/15/2011   HDL 48.30 06/15/2011   LDLDIRECT 136.1 01/02/2007   ALT 15 07/08/2011   AST 19 07/08/2011   NA 139 07/08/2011   K 4.1 07/08/2011   CL 103 07/08/2011   CREATININE 1.05 07/08/2011   BUN 11 07/08/2011   CO2 24 07/08/2011   TSH 1.63 12/14/2011   HGBA1C 6.2 03/14/2012      Assessment & Plan:  [place]/v70.0 - Today patient counseled on age appropriate routine health concerns for screening and prevention, each reviewed and up to date or declined. Immunizations reviewed and up to date or declined. Labs/ECG reviewed. Risk factors for depression reviewed and negative. Hearing function and visual acuity are intact. ADLs screened and addressed as needed. Functional ability and level of safety reviewed and appropriate. Education, counseling and referrals performed based on assessed risks today. Patient provided with a copy of personalized plan for preventive services.  Also see problem list. Medications and labs reviewed today.

## 2012-06-14 NOTE — Telephone Encounter (Signed)
Called pt concerning labs. Pt wanted to let md know will start taking pepcid. Also want to ask md can she start getting her B12 injection here instead of Dr. Juanda Chance office...Raechel Chute

## 2012-06-14 NOTE — Assessment & Plan Note (Signed)
Reports randitine not covered but omeprazole is covered -  ?absorbtion issues related to same Consider trying famotidine if covered by insurance

## 2012-06-14 NOTE — Telephone Encounter (Signed)
Okay to continue Pepcid. I have removed omeprazole from her med list Okay with me to receive B12 shots here. Please arrange nurse visit monthly for same

## 2012-06-14 NOTE — Assessment & Plan Note (Signed)
Controlled PVCs and palpitations symptoms on beta-blocker and flecainide -  continue same and follow with cards annually

## 2012-06-14 NOTE — Patient Instructions (Signed)
It was good to see you today. We have reviewed your prior records including labs and tests today Health Maintenance reviewed - all recommended immunizations and age-appropriate screenings are up-to-date.  Test(s) ordered today. Your results will be released to MyChart (or called to you) after review, usually within 72hours after test completion. If any changes need to be made, you will be notified at that same time. Try to have generic Pepcid approved in place of ranitidine to take instead of omeprazole for reflux symptoms Other medications reviewed and updated, no additional changes Your prescription(s) have been submitted to your pharmacy. Please take as directed and contact our office if you believe you are having problem(s) with the medication(s). Please schedule followup in 6 months for DM check, call sooner if problems. Health Maintenance, Females A healthy lifestyle and preventative care can promote health and wellness.  Maintain regular health, dental, and eye exams.  Eat a healthy diet. Foods like vegetables, fruits, whole grains, low-fat dairy products, and lean protein foods contain the nutrients you need without too many calories. Decrease your intake of foods high in solid fats, added sugars, and salt. Get information about a proper diet from your caregiver, if necessary.  Regular physical exercise is one of the most important things you can do for your health. Most adults should get at least 150 minutes of moderate-intensity exercise (any activity that increases your heart rate and causes you to sweat) each week. In addition, most adults need muscle-strengthening exercises on 2 or more days a week.   Maintain a healthy weight. The body mass index (BMI) is a screening tool to identify possible weight problems. It provides an estimate of body fat based on height and weight. Your caregiver can help determine your BMI, and can help you achieve or maintain a healthy weight. For adults 20  years and older:  A BMI below 18.5 is considered underweight.  A BMI of 18.5 to 24.9 is normal.  A BMI of 25 to 29.9 is considered overweight.  A BMI of 30 and above is considered obese.  Maintain normal blood lipids and cholesterol by exercising and minimizing your intake of saturated fat. Eat a balanced diet with plenty of fruits and vegetables. Blood tests for lipids and cholesterol should begin at age 4 and be repeated every 5 years. If your lipid or cholesterol levels are high, you are over 50, or you are a high risk for heart disease, you may need your cholesterol levels checked more frequently.Ongoing high lipid and cholesterol levels should be treated with medicines if diet and exercise are not effective.  If you smoke, find out from your caregiver how to quit. If you do not use tobacco, do not start.  If you are pregnant, do not drink alcohol. If you are breastfeeding, be very cautious about drinking alcohol. If you are not pregnant and choose to drink alcohol, do not exceed 1 drink per day. One drink is considered to be 12 ounces (355 mL) of beer, 5 ounces (148 mL) of wine, or 1.5 ounces (44 mL) of liquor.  Avoid use of street drugs. Do not share needles with anyone. Ask for help if you need support or instructions about stopping the use of drugs.  High blood pressure causes heart disease and increases the risk of stroke. Blood pressure should be checked at least every 1 to 2 years. Ongoing high blood pressure should be treated with medicines, if weight loss and exercise are not effective.  If you are  38 to 77 years old, ask your caregiver if you should take aspirin to prevent strokes.  Diabetes screening involves taking a blood sample to check your fasting blood sugar level. This should be done once every 3 years, after age 50, if you are within normal weight and without risk factors for diabetes. Testing should be considered at a younger age or be carried out more frequently if  you are overweight and have at least 1 risk factor for diabetes.  Breast cancer screening is essential preventative care for women. You should practice "breast self-awareness." This means understanding the normal appearance and feel of your breasts and may include breast self-examination. Any changes detected, no matter how small, should be reported to a caregiver. Women in their 36s and 30s should have a clinical breast exam (CBE) by a caregiver as part of a regular health exam every 1 to 3 years. After age 39, women should have a CBE every year. Starting at age 5, women should consider having a mammogram (breast X-ray) every year. Women who have a family history of breast cancer should talk to their caregiver about genetic screening. Women at a high risk of breast cancer should talk to their caregiver about having an MRI and a mammogram every year.  The Pap test is a screening test for cervical cancer. Women should have a Pap test starting at age 41. Between ages 67 and 83, Pap tests should be repeated every 2 years. Beginning at age 33, you should have a Pap test every 3 years as long as the past 3 Pap tests have been normal. If you had a hysterectomy for a problem that was not cancer or a condition that could lead to cancer, then you no longer need Pap tests. If you are between ages 68 and 74, and you have had normal Pap tests going back 10 years, you no longer need Pap tests. If you have had past treatment for cervical cancer or a condition that could lead to cancer, you need Pap tests and screening for cancer for at least 20 years after your treatment. If Pap tests have been discontinued, risk factors (such as a new sexual partner) need to be reassessed to determine if screening should be resumed. Some women have medical problems that increase the chance of getting cervical cancer. In these cases, your caregiver may recommend more frequent screening and Pap tests.  The human papillomavirus (HPV) test is  an additional test that may be used for cervical cancer screening. The HPV test looks for the virus that can cause the cell changes on the cervix. The cells collected during the Pap test can be tested for HPV. The HPV test could be used to screen women aged 50 years and older, and should be used in women of any age who have unclear Pap test results. After the age of 54, women should have HPV testing at the same frequency as a Pap test.  Colorectal cancer can be detected and often prevented. Most routine colorectal cancer screening begins at the age of 18 and continues through age 46. However, your caregiver may recommend screening at an earlier age if you have risk factors for colon cancer. On a yearly basis, your caregiver may provide home test kits to check for hidden blood in the stool. Use of a small camera at the end of a tube, to directly examine the colon (sigmoidoscopy or colonoscopy), can detect the earliest forms of colorectal cancer. Talk to your caregiver about this  at age 46, when routine screening begins. Direct examination of the colon should be repeated every 5 to 10 years through age 49, unless early forms of pre-cancerous polyps or small growths are found.  Hepatitis C blood testing is recommended for all people born from [birth year] through 1963 and any individual with known risks for hepatitis C.  Practice safe sex. Use condoms and avoid high-risk sexual practices to reduce the spread of sexually transmitted infections (STIs). Sexually active women aged 20 and younger should be checked for Chlamydia, which is a common sexually transmitted infection. Older women with new or multiple partners should also be tested for Chlamydia. Testing for other STIs is recommended if you are sexually active and at increased risk.  Osteoporosis is a disease in which the bones lose minerals and strength with aging. This can result in serious bone fractures. The risk of osteoporosis can be identified using a bone  density scan. Women ages 51 and over and women at risk for fractures or osteoporosis should discuss screening with their caregivers. Ask your caregiver whether you should be taking a calcium supplement or vitamin D to reduce the rate of osteoporosis.  Menopause can be associated with physical symptoms and risks. Hormone replacement therapy is available to decrease symptoms and risks. You should talk to your caregiver about whether hormone replacement therapy is right for you.  Use sunscreen with a sun protection factor (SPF) of 30 or greater. Apply sunscreen liberally and repeatedly throughout the day. You should seek shade when your shadow is shorter than you. Protect yourself by wearing long sleeves, pants, a wide-brimmed hat, and sunglasses year round, whenever you are outdoors.  Notify your caregiver of new moles or changes in moles, especially if there is a change in shape or color. Also notify your caregiver if a mole is larger than the size of a pencil eraser.  Stay current with your immunizations. Document Released: 10/05/2010 Document Revised: 06/14/2011 Document Reviewed: 10/05/2010 [facility] Patient Information 2013 [place], [place].

## 2012-06-14 NOTE — Assessment & Plan Note (Signed)
Diet controlled until 12/2011- now on metformin check a1c and monitor same, tx as ongoing Intol of statins Lab Results  Component Value Date   HGBA1C 6.2 03/14/2012

## 2012-06-14 NOTE — Assessment & Plan Note (Signed)
BP Readings from Last 3 Encounters:  06/14/12 130/68  04/10/12 115/60  12/15/11 120/78   The current medical regimen is effective;  continue present plan and medications.

## 2012-06-15 NOTE — Telephone Encounter (Signed)
Notified pt with md response. Transferred to schedulers to make nurse visit appt...lmb

## 2012-06-19 ENCOUNTER — Ambulatory Visit
Admission: RE | Admit: 2012-06-19 | Discharge: 2012-06-19 | Disposition: A | Discharge status: Home / Self Care | Payer: Medicare Other | Source: Ambulatory Visit | Attending: Oncology | Admitting: Oncology

## 2012-06-19 DIAGNOSIS — R928 Other abnormal and inconclusive findings on diagnostic imaging of breast: Secondary | ICD-10-CM | POA: Diagnosis not present

## 2012-06-22 DIAGNOSIS — Z0000 Encounter for general adult medical examination without abnormal findings: Secondary | ICD-10-CM | POA: Diagnosis not present

## 2012-06-22 DIAGNOSIS — Z01419 Encounter for gynecological examination (general) (routine) without abnormal findings: Secondary | ICD-10-CM | POA: Diagnosis not present

## 2012-06-22 DIAGNOSIS — B373 Candidiasis of vulva and vagina: Secondary | ICD-10-CM | POA: Diagnosis not present

## 2012-06-22 DIAGNOSIS — N9489 Other specified conditions associated with female genital organs and menstrual cycle: Secondary | ICD-10-CM | POA: Diagnosis not present

## 2012-07-03 DIAGNOSIS — H6120 Impacted cerumen, unspecified ear: Secondary | ICD-10-CM | POA: Diagnosis not present

## 2012-07-03 DIAGNOSIS — H902 Conductive hearing loss, unspecified: Secondary | ICD-10-CM | POA: Diagnosis not present

## 2012-07-11 ENCOUNTER — Other Ambulatory Visit (HOSPITAL_BASED_OUTPATIENT_CLINIC_OR_DEPARTMENT_OTHER): Payer: Medicare Other | Admitting: Lab

## 2012-07-11 DIAGNOSIS — Z853 Personal history of malignant neoplasm of breast: Secondary | ICD-10-CM

## 2012-07-11 LAB — CBC WITH DIFFERENTIAL/PLATELET
BASO%: 0.4 % (ref 0.0–2.0)
EOS%: 2 % (ref 0.0–7.0)
MCH: 31.9 pg (ref 25.1–34.0)
MCHC: 33.3 g/dL (ref 31.5–36.0)
MONO#: 0.6 10*3/uL (ref 0.1–0.9)
NEUT%: 49 % (ref 38.4–76.8)
RBC: 3.76 10*6/uL (ref 3.70–5.45)
RDW: 13.1 % (ref 11.2–14.5)
WBC: 5 10*3/uL (ref 3.9–10.3)
lymph#: 1.9 10*3/uL (ref 0.9–3.3)
nRBC: 0 % (ref 0–0)

## 2012-07-11 LAB — COMPREHENSIVE METABOLIC PANEL (CC13)
ALT: 14 U/L (ref 0–55)
AST: 15 U/L (ref 5–34)
Albumin: 3.5 g/dL (ref 3.5–5.0)
Alkaline Phosphatase: 43 U/L (ref 40–150)
BUN: 15.1 mg/dL (ref 7.0–26.0)
Potassium: 4.8 mEq/L (ref 3.5–5.1)
Sodium: 144 mEq/L (ref 136–145)

## 2012-07-17 ENCOUNTER — Ambulatory Visit (HOSPITAL_BASED_OUTPATIENT_CLINIC_OR_DEPARTMENT_OTHER): Payer: Medicare Other | Admitting: Oncology

## 2012-07-17 ENCOUNTER — Telehealth: Payer: Self-pay | Admitting: Oncology

## 2012-07-17 VITALS — BP 130/68 | HR 63 | Temp 98.2°F | Resp 20 | Ht 63.0 in | Wt 142.8 lb

## 2012-07-17 DIAGNOSIS — Z170 Estrogen receptor positive status [ER+]: Secondary | ICD-10-CM | POA: Diagnosis not present

## 2012-07-17 DIAGNOSIS — H6980 Other specified disorders of Eustachian tube, unspecified ear: Secondary | ICD-10-CM | POA: Diagnosis not present

## 2012-07-17 DIAGNOSIS — C50319 Malignant neoplasm of lower-inner quadrant of unspecified female breast: Secondary | ICD-10-CM | POA: Diagnosis not present

## 2012-07-17 DIAGNOSIS — C50519 Malignant neoplasm of lower-outer quadrant of unspecified female breast: Secondary | ICD-10-CM

## 2012-07-17 DIAGNOSIS — H8190 Unspecified disorder of vestibular function, unspecified ear: Secondary | ICD-10-CM | POA: Diagnosis not present

## 2012-07-17 DIAGNOSIS — C50919 Malignant neoplasm of unspecified site of unspecified female breast: Secondary | ICD-10-CM

## 2012-07-17 DIAGNOSIS — Z853 Personal history of malignant neoplasm of breast: Secondary | ICD-10-CM

## 2012-07-17 MED ORDER — TAMOXIFEN CITRATE 20 MG PO TABS: 20.0000 mg | ORAL_TABLET | Freq: Every day | ORAL | Status: DC

## 2012-07-17 NOTE — Progress Notes (Signed)
ID: Tami Jacobson   DOB: 1927/05/12  MR#: [id number]  AOZ#:[id number]  HISTORY OF PRESENT ILLNESS: Tami Jacobson had a routine screening mammogram at [facility] 06/06/2006.  This showed new microcalcifications in the inner lower quadrant of the left breast.  [facility] and on 06/10/2006, she had a stereotactic biopsy, which showed ([id number] and [id number]) a ductal carcinoma in situ, intermediate grade, which was strongly ER and PR positive (both at 100%).    With this information, the patient was referred to Dr. Kendrick Jacobson and bilateral breast MRIs were obtained 06/23/2006.  This showed only the solitary mass in the left breast consistent with the known malignancy and accordingly Dr. Earlene Jacobson proceeded to excisional biopsy 07/04/2006.  The final pathology there ([id number]) showed in addition to the ductal carcinoma in situ a 1.2-cm area of invasive ductal carcinoma, grade 1, with very close, although negative margins.  No evidence of lymphovascular invasion.  The patient did not have a sentinel lymph node because she was not known to have invasive disease. Her subsequent history is as detailed below.  INTERVAL HISTORY:  Tami Jacobson returns today for followup of her breast cancer. Since her last visit here she has lost 2 sisters-in-law and her sister in [place], who is 57, is not expected to survive the week.  REVIEW OF SYSTEMS: The patient herself is doing "fine". She is tolerating tamoxifen without any significant side effects and in particular hot flashes are few and far between and vaginal dryness is a minor issue for her. She does have some psoriasis which is not related. She has recently been found to have an elevated A1c, and started on metformin, which brought this down to 5.7 per her account. She now walks on regular basis. Otherwise a detailed review of systems today was noncontributory.  PAST MEDICAL HISTORY: Past Medical History  Diagnosis Date  .  Vitamin B12 deficiency   . Carcinoma in situ of breast     R 06/2006, L 06/2008, L 06/2011 LCIS  . Esophageal stricture   . Diverticulosis   . Hypothyroid   . Hyperlipidemia     intol of all statins  . Anemia   . Gastritis   . Alopecia   . Carotid bruit   . Premature ventricular contraction   . PAC (premature atrial contraction)   . Diabetes mellitus     controlled by diet  . GERD (gastroesophageal reflux disease)   . Arthritis   Significant for hypertension, GERD, history of palpitations, history of osteoarthritis, diverticular disease, history of ocular migraines, heart murmur, status post cholecystectomy, status post appendectomy, status post simple hysterectomy without salpingo-oophorectomy, status post tonsillectomy and adenoidectomy, status post mild to moderate hypercholesterolemia.    PAST SURGICAL HISTORY: Past Surgical History  Procedure Laterality Date  . Abdominal hysterectomy  1979  . Appendectomy  2002  . Cholecystectomy  2002  . Tonsillectomy  1942  . Esophageal dilation  2006  . Breast lumpectomy  2008, 2010    X 2  . Cataract extraction    . Breast biopsy  07/02/2011    Procedure: BREAST BIOPSY WITH NEEDLE LOCALIZATION;  Surgeon: Kandis Cocking, MD;  Location: [facility] OR;  Service: General;  Laterality: Left;  left breast atypical hyperplasia needle localization biopsy    FAMILY HISTORY Family History  Problem Relation Age of Onset  . Colon cancer Mother   . Breast cancer Sister     x 3  . Diverticulitis Sister   .  Cancer Sister     breast  . Heart disease Maternal Uncle     CABG  . Diverticulitis Sister   . Cancer Sister     leukemia  . Heart disease Brother   . Anesthesia problems Neg Hx   The patient's father died at the age of 48, she believes, from suicide.  The patient's mother died at the age of 2 in the setting of colon cancer, which had been diagnosed when she was 93.  She had a myocardial infarction and apparently a ruptured aneurysm as the cause  of death.  The patient is 1 of 5 sisters; 2 sisters died from breast cancer-1 was diagnosed age 2 and died at age 71, 1 was diagnosed age 49 and died at age 34.  A third sister was diagnosed at age 56 and is alive 2 years later with breast cancer, and of course, the patient now has just been diagnosed so there are 4 out of 5 sisters with breast cancer, but none before age 26.  There are also 2 brothers, neither with cancer.  GYNECOLOGIC HISTORY: She is GX P1, first pregnancy age 57.  She took Premarin for 29 years, stopping about 5 years ago.    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  She has been married to Tami Jacobson by Tami Jacobson-for 54 years.  He used to work for [employer].  Her daughter, Tami Jacobson, is a Manufacturing systems engineer..  The patient has a 43-year-old grandchild.  She attends [employer] in [place].    ADVANCED DIRECTIVES: In place  HEALTH MAINTENANCE: History  Substance Use Topics  . Smoking status: Never Smoker   . Smokeless tobacco: Never Used  . Alcohol Use: No     Colonoscopy:  PAP:  Bone density:  Lipid panel:  Allergies  Allergen Reactions  . Cephalexin   . Codeine   . Lansoprazole   . Levofloxacin   . Nitrofurantoin   . Rofecoxib   . Statins     Current Outpatient Prescriptions  Medication Sig Dispense Refill  . clobetasol cream (TEMOVATE) 0.05 % 1 application as needed. Irritated skin      . cyanocobalamin (,VITAMIN B-12,) 1000 MCG/ML injection Inject 1,000 mcg into the muscle every 30 (thirty) days. Last injection 06/20/11      . famotidine (PEPCID) 20 MG tablet Take 1 tablet (20 mg total) by mouth 2 (two) times daily.  180 tablet  1  . flecainide (TAMBOCOR) 50 MG tablet Take 1 tablet (50 mg total) by mouth 2 (two) times daily.  60 tablet  11  . hydrocortisone-pramoxine (ANALPRAM-HC) 2.5-1 % rectal cream Place 1 application rectally 3 (three) times daily.  30 g   1  . metFORMIN (GLUCOPHAGE XR) 500 MG 24 hr tablet Take 1 tablet (500 mg total) by mouth daily with breakfast.  90 tablet  3  . metoprolol succinate (TOPROL-XL) 50 MG 24 hr tablet TAKE ONE TABLET BY MOUTH EVERY DAY  30 tablet  12  . tamoxifen (NOLVADEX) 20 MG tablet        No current facility-administered medications for this visit.    OBJECTIVE: Elderly white woman who appears comfortable Filed Vitals:   07/17/12 1019  BP: 130/68  Pulse: 63  Temp: 98.2 F (36.8 C)  Resp: 20     Body mass index is 25.3 kg/(m^2).    ECOG FS: 0  Sclerae unicteric Oropharynx  clear No peripheral adenopathy Lungs no rales or rhonchi Heart regular rate and rhythm Abd soft, nontender, positive bowel sounds, no masses palpated MSK no focal spinal tenderness, no peripheral edema Neuro: nonfocal, well oriented, pleasant affect Breasts: Right breast no evidence of local recurrence; left breast status post  lumpectomy; the cosmetic result is excellent. The left axilla is clear  LAB RESULTS: Lab Results  Component Value Date   WBC 5.0 07/11/2012   NEUTROABS 2.5 07/11/2012   HGB 12.0 07/11/2012   HCT 36.0 07/11/2012   MCV 95.7 07/11/2012   PLT 123* 07/11/2012      Chemistry      Component Value Date/Time   NA 144 07/11/2012 0901   NA 143 06/14/2012 1010   K 4.8 07/11/2012 0901   K 5.0 06/14/2012 1010   CL 106 07/11/2012 0901   CL 108 06/14/2012 1010   CO2 28 07/11/2012 0901   CO2 31 06/14/2012 1010   BUN 15.1 07/11/2012 0901   BUN 13 06/14/2012 1010   CREATININE 0.9 07/11/2012 0901   CREATININE 0.9 06/14/2012 1010      Component Value Date/Time   CALCIUM 9.7 07/11/2012 0901   CALCIUM 9.5 06/14/2012 1010   ALKPHOS 43 07/11/2012 0901   ALKPHOS 80 07/08/2011 1252   AST 15 07/11/2012 0901   AST 19 07/08/2011 1252   ALT 14 07/11/2012 0901   ALT 15 07/08/2011 1252   BILITOT 0.70 07/11/2012 0901   BILITOT 0.8 07/08/2011 1252       Lab Results  Component Value Date   LABCA2 18 10/07/2009    No components found with this basename:  [id number]    No results found for this basename: INR,  in the last 168 hours  Urinalysis No results found for this basename: colorurine,  appearanceur,  labspec,  phurine,  glucoseu,  hgbur,  bilirubinur,  ketonesur,  proteinur,  urobilinogen,  nitrite,  leukocytesur    STUDIES: [facility] Digital Diagnostic Bilat  06/19/2012  *RADIOLOGY REPORT*  Clinical Data:  History malignant lumpectomy of the right breast in 2010 and malignant lumpectomy of the left breast in 2008.  She also had benign excisional biopsy for high risk lesion within the left breast (superiorly located) in 2013.  Follow-up evaluation.  DIGITAL DIAGNOSTIC BILATERAL MAMMOGRAM WITH CAD  Comparison: Previous examinations.  Findings:  ACR Breast Density Category 3: The breast tissue is heterogeneously dense.  There are postsurgical scarring changes located superiorly within the left breast related to the patient's high risk excisional biopsy.  There are stable scarring changes bilaterally related to the patient's lumpectomies.  There is no specific evidence for recurrent tumor or developing malignancy within either breast. There are stable benign-appearing calcifications noted bilaterally.  Mammographic images were processed with CAD.  IMPRESSION: No findings worrisome for recurrent tumor or developing malignancy.  RECOMMENDATION: Bilateral diagnostic mammography in 1 year.  I have discussed the findings and recommendations with the patient. Results were also provided in writing at the conclusion of the visit.  BI-RADS CATEGORY 2:  Benign finding(s).   Original Report Authenticated By: Rolla Plate, M.D.    ASSESSMENT: 79 y.o.  [place] woman with history of -  (1) Left lumpectomy in March 2008 for a 1.2 cm, grade 1, invasive ductal carcinoma.  Strongly ER/PR positive, HER-2/neu negative, with borderline proliferation fraction.  Treated intermittently with tamoxifen.  (2) Right lumpectomy March 2010 for a T1cN0, grade 1, invasive ductal  carcinoma, ER strongly positive, PR and HER-2/neu negative, with MIB-1 of 14%.   (  3) On Arimidex April 2010 to April 2013  (4) s/p excision of an area of lobular carcinoma in situ from the left beast 07/02/2011  (5) started tamoxifen April 2013   PLAN: Tami Jacobson is doing very well from a breast cancer point of view. She has one more year to go on her anti-estrogens, so we will see her in April of 2015 she will "graduate" from cancer followup. Otherwise she knows to call for any problems that may develop before then.   Delma Drone C    07/17/2012

## 2012-07-18 ENCOUNTER — Ambulatory Visit (INDEPENDENT_AMBULATORY_CARE_PROVIDER_SITE_OTHER): Payer: Medicare Other | Admitting: *Deleted

## 2012-07-18 DIAGNOSIS — E538 Deficiency of other specified B group vitamins: Secondary | ICD-10-CM | POA: Diagnosis not present

## 2012-07-18 MED ORDER — CYANOCOBALAMIN 1000 MCG/ML IJ SOLN
1000.0000 ug | Freq: Once | INTRAMUSCULAR | Status: AC
  Administered 2012-07-18: 1000 ug via INTRAMUSCULAR

## 2012-07-27 ENCOUNTER — Ambulatory Visit (INDEPENDENT_AMBULATORY_CARE_PROVIDER_SITE_OTHER): Payer: Medicare Other | Admitting: Surgery

## 2012-07-27 ENCOUNTER — Encounter (INDEPENDENT_AMBULATORY_CARE_PROVIDER_SITE_OTHER): Payer: Self-pay | Admitting: Surgery

## 2012-07-27 VITALS — BP 118/62 | HR 60 | Temp 97.8°F | Resp 18 | Ht 63.0 in | Wt 141.0 lb

## 2012-07-27 DIAGNOSIS — Z853 Personal history of malignant neoplasm of breast: Secondary | ICD-10-CM

## 2012-07-27 NOTE — Progress Notes (Signed)
[facility]  Ovidio Kin, MD,  FACS [address] [place]  [address] [place], [place]    [address] Phone:  [phone] FAX:  [phone]   Re:   Tami Jacobson DOB:   1927/10/15 MRN:   [id number]  ASSESSMENT AND PLAN: 1.  T1c, N0 right breast cancer.  Lumpectomy 06/26/2008.  ER - positive, PR - negative, Ki67 - 14%.  Her2Neu - neg  Dr. Darnelle Catalan and Kathrynn Running treating oncologist.  Switched to Tamoxifen 2013  Tolerating this well. Wil continue for 5 years - till 2015.  Disease free.  She will see me back in 6 months.   2.  T1c, N0 left breast cancer.  Lumpectomy 06/24/2006.  Dr. Cindee Lame did this surgery.  ER/PR positive.  Ki67 - 12%.  Her2Neu - neg.  3.  Left breast atypical lobular hyperplasia.  Open biopsy by me - 07/02/2011 - shows atypical lobular neoplasia, no cancerous changes.  4.  Numbness right axilla.  Unchanged. 5.  Left ear complaint/vertgo.    She sees Dr. Salena Saner. Shaelynn Dragos.  6.  Hypertension. 7.  GERD. 8.  Hypothyroid - replacement. 9.  Diabetes mellitus - 2013.9 - just started on Metformin  HISTORY OF PRESENT ILLNESS: Tami Jacobson is a 79 y.o. (DOB: [birth year])  white female who is a patient of Rene Paci, MD and comes to me today for follow up for bilateral breast cancer.    She is doing well.  Her sister, who was 75 and lives in [place], died last week.  She is not going to be able to go to the funeral.  She still has numbness in her right axilla, but otherwise has no complaint or concern.  She says that she has been going to a lot of doctor's appointments recently.  PHYSICAL EXAM: BP 118/62  Pulse 60  Temp(Src) 97.8 F (36.6 C)  Resp 18  Ht 5\' 3"  (1.6 m)  Wt 141 lb (63.957 kg)  BMI 24.98 kg/m2  HEENT:  Pupils equal.  Dentition good. NECK:  Supple.  No thyroid mass. LYMPH NODES:  No cervical, supraclavicular, or axillary adenopathy. BREASTS -  RIGHT:  No palpable mass or nodule.  No nipple discharge. Right nipple  inverted.   LEFT:  Incision at 12 - 1 o'clock looks good. UPPER EXTREMITIES:  No evidence of lymphedema.  She points to a patch of numbness in the right axilla.  DATA REVIEWED: [facility] - 06/19/2012 - negative  Ovidio Kin, MD, FACS Office:  [phone]

## 2012-08-08 DIAGNOSIS — M722 Plantar fascial fibromatosis: Secondary | ICD-10-CM | POA: Diagnosis not present

## 2012-08-17 ENCOUNTER — Ambulatory Visit (INDEPENDENT_AMBULATORY_CARE_PROVIDER_SITE_OTHER): Payer: Medicare Other | Admitting: *Deleted

## 2012-08-17 DIAGNOSIS — E538 Deficiency of other specified B group vitamins: Secondary | ICD-10-CM | POA: Diagnosis not present

## 2012-08-17 MED ORDER — CYANOCOBALAMIN 1000 MCG/ML IJ SOLN
1000.0000 ug | Freq: Once | INTRAMUSCULAR | Status: AC
  Administered 2012-08-17: 1000 ug via INTRAMUSCULAR

## 2012-09-19 ENCOUNTER — Ambulatory Visit (INDEPENDENT_AMBULATORY_CARE_PROVIDER_SITE_OTHER): Payer: Medicare Other | Admitting: *Deleted

## 2012-09-19 DIAGNOSIS — E538 Deficiency of other specified B group vitamins: Secondary | ICD-10-CM

## 2012-09-19 MED ORDER — CYANOCOBALAMIN 1000 MCG/ML IJ SOLN
1000.0000 ug | Freq: Once | INTRAMUSCULAR | Status: AC
  Administered 2012-09-19: 1000 ug via INTRAMUSCULAR

## 2012-10-19 ENCOUNTER — Ambulatory Visit (INDEPENDENT_AMBULATORY_CARE_PROVIDER_SITE_OTHER): Payer: Medicare Other | Admitting: *Deleted

## 2012-10-19 ENCOUNTER — Telehealth: Payer: Self-pay | Admitting: Internal Medicine

## 2012-10-19 DIAGNOSIS — E538 Deficiency of other specified B group vitamins: Secondary | ICD-10-CM

## 2012-10-19 MED ORDER — CYANOCOBALAMIN 1000 MCG/ML IJ SOLN
1000.0000 ug | Freq: Once | INTRAMUSCULAR | Status: AC
  Administered 2012-10-19: 1000 ug via INTRAMUSCULAR

## 2012-10-19 NOTE — Telephone Encounter (Signed)
Patient questions answered.

## 2012-10-30 ENCOUNTER — Other Ambulatory Visit: Payer: Self-pay | Admitting: Cardiology

## 2012-11-02 ENCOUNTER — Other Ambulatory Visit: Payer: Self-pay | Admitting: Internal Medicine

## 2012-11-02 ENCOUNTER — Telehealth: Payer: Self-pay | Admitting: Internal Medicine

## 2012-11-02 DIAGNOSIS — E538 Deficiency of other specified B group vitamins: Secondary | ICD-10-CM

## 2012-11-02 NOTE — Telephone Encounter (Signed)
Advised patient that she will be due for b12 levels to be drawn around 11/09/12. She verbalizes understanding.

## 2012-11-09 ENCOUNTER — Other Ambulatory Visit (INDEPENDENT_AMBULATORY_CARE_PROVIDER_SITE_OTHER): Payer: Medicare Other

## 2012-11-09 DIAGNOSIS — E538 Deficiency of other specified B group vitamins: Secondary | ICD-10-CM

## 2012-11-10 ENCOUNTER — Other Ambulatory Visit: Payer: Self-pay | Admitting: *Deleted

## 2012-11-10 DIAGNOSIS — E538 Deficiency of other specified B group vitamins: Secondary | ICD-10-CM

## 2012-11-13 ENCOUNTER — Telehealth: Payer: Self-pay | Admitting: Internal Medicine

## 2012-11-13 NOTE — Telephone Encounter (Signed)
Patient states she has to keep taking the B12 shots to feel good. She states her PCP can get the B12. Please, advise.

## 2012-11-13 NOTE — Telephone Encounter (Signed)
Please have her PCP to prescribe her B12 injections.

## 2012-11-14 ENCOUNTER — Encounter: Payer: Self-pay | Admitting: Physician Assistant

## 2012-11-14 ENCOUNTER — Ambulatory Visit (INDEPENDENT_AMBULATORY_CARE_PROVIDER_SITE_OTHER): Payer: Medicare Other | Admitting: Physician Assistant

## 2012-11-14 ENCOUNTER — Other Ambulatory Visit (INDEPENDENT_AMBULATORY_CARE_PROVIDER_SITE_OTHER): Payer: Medicare Other

## 2012-11-14 VITALS — BP 138/78 | HR 61 | Ht 63.0 in | Wt 140.0 lb

## 2012-11-14 DIAGNOSIS — K648 Other hemorrhoids: Secondary | ICD-10-CM

## 2012-11-14 DIAGNOSIS — Z853 Personal history of malignant neoplasm of breast: Secondary | ICD-10-CM

## 2012-11-14 DIAGNOSIS — R634 Abnormal weight loss: Secondary | ICD-10-CM

## 2012-11-14 DIAGNOSIS — F5000 Anorexia nervosa, unspecified: Secondary | ICD-10-CM

## 2012-11-14 DIAGNOSIS — Z800 Family history of malignant neoplasm of digestive organs: Secondary | ICD-10-CM | POA: Diagnosis not present

## 2012-11-14 DIAGNOSIS — K6289 Other specified diseases of anus and rectum: Secondary | ICD-10-CM

## 2012-11-14 LAB — BASIC METABOLIC PANEL
BUN: 12 mg/dL (ref 6–23)
Creatinine, Ser: 0.9 mg/dL (ref 0.4–1.2)
GFR: 61.95 mL/min (ref >60.00)
Glucose, Bld: 125 mg/dL — ABNORMAL HIGH (ref 70–99)
Potassium: 4.5 mEq/L (ref 3.5–5.1)

## 2012-11-14 MED ORDER — HYDROCORTISONE ACETATE 25 MG RE SUPP: RECTAL | Status: DC

## 2012-11-14 NOTE — Progress Notes (Signed)
Subjective:    Patient ID: Tami Jacobson, female    DOB: 1927-11-04, 79 y.o.   MRN: [id number]  HPI  Tami Jacobson is a very nice 79 year old white female known to Dr. [name]. She has family history of colon cancer in her mother. Patient last had colonoscopy in August of 2011 was noted to have moderate diverticulosis with luminal narrowing in the sigmoid colon otherwise negative exam. She was seen in the office in September of 2013 with rectal discomfort and found to have an anal fissure. She has history of breast cancer x2 and then had a lumpectomy for fibrocystic disease in 2013. She has been on tamoxifen again since 2013. Other problems include hypothyroidism, B12 deficiency, diabetes mellitus, chronic GERD and history of an esophageal stricture. Patient comes in today with complaints of 2 week history of double aching rectal discomfort. She says she has discomfort with the bowel movement and after a bowel movement internally. She has not noticed any bleeding. She has Analpram 2 use at home but has only been using this externally. He denies any changes in her bowel habits, has been having bowel movements regularly. She denies any abdominal pain but says that she has lost about 10 pounds over the past year that her appetite is generally poor. She says most of the time she can't find anything that really sounds appealing. She also complains of a generalized "jockey" feeling in her abdomen with eating She is concerned because of her strong family history of cancers. Labs from April  Are reviewed and were unremarkable.    Review of Systems  Constitutional: Positive for appetite change and unexpected weight change.  HENT: Negative.   Eyes: Negative.   Respiratory: Negative.   Gastrointestinal: Positive for rectal pain.  Endocrine: Negative.   Genitourinary: Negative.   Musculoskeletal: Negative.   Skin: Negative.   Allergic/Immunologic: Negative.   Neurological: Negative.   Hematological:  Negative.   Psychiatric/Behavioral: Negative.    Outpatient Prescriptions Prior to Visit  Medication Sig Dispense Refill  . clobetasol cream (TEMOVATE) 0.05 % 1 application as needed. Irritated skin      . cyanocobalamin (,VITAMIN B-12,) 1000 MCG/ML injection Inject 1,000 mcg into the muscle every 30 (thirty) days. Last injection 06/20/11      . flecainide (TAMBOCOR) 50 MG tablet Take 1 tablet (50 mg total) by mouth 2 (two) times daily.  60 tablet  11  . hydrocortisone-pramoxine (ANALPRAM-HC) 2.5-1 % rectal cream Place 1 application rectally 3 (three) times daily.  30 g  1  . metFORMIN (GLUCOPHAGE XR) 500 MG 24 hr tablet Take 1 tablet (500 mg total) by mouth daily with breakfast.  90 tablet  3  . metoprolol succinate (TOPROL-XL) 50 MG 24 hr tablet TAKE ONE TABLET BY MOUTH EVERY DAY  30 tablet  0  . omeprazole (PRILOSEC) 20 MG capsule Take 20 mg by mouth daily.      . tamoxifen (NOLVADEX) 20 MG tablet Take 1 tablet (20 mg total) by mouth daily.  90 tablet  12  . terconazole (TERAZOL 7) 0.4 % vaginal cream       . famotidine (PEPCID) 20 MG tablet Take 1 tablet (20 mg total) by mouth 2 (two) times daily.  180 tablet  1   No facility-administered medications prior to visit.   Allergies  Allergen Reactions  . Cephalexin   . Codeine   . Lansoprazole   . Levofloxacin   . Nitrofurantoin   . Rofecoxib   . Statins  Patient Active Problem List   Diagnosis Date Noted  . Anal fissure 12/16/2011  . Fibrocystic breast changes. Left.  Biopsy 07/02/2011. 07/14/2011  . History of breast cancer, Right, T1c, N0, lumpectomy 06/26/2008.   Left, T1c, N0, lumpectomy 06/24/2006. 05/12/2011  . DIZZINESS 12/22/2009  . POSTHERPETIC NEURALGIA 12/10/2009  . DIABETES MELLITUS, TYPE II 06/11/2009  . PREMATURE VENTRICULAR CONTRACTIONS 08/19/2008  . B12 DEFICIENCY 08/29/2007  . ESOPHAGEAL STRICTURE 04/27/2007  . GERD (gastroesophageal reflux disease) 04/27/2007  . DIVERTICULOSIS, COLON 04/27/2007  .  HYPOTHYROIDISM 02/14/2007  . OTHER ALOPECIA 02/14/2007  . HYPERLIPIDEMIA 01/02/2007  . ANEMIA-NOS 01/02/2007  . HYPERTENSION, ESSENTIAL NOS 01/02/2007  . CAROTID BRUIT 08/22/2006   History  Substance Use Topics  . Smoking status: Never Smoker   . Smokeless tobacco: Never Used  . Alcohol Use: No   family history includes Breast cancer in her sister; Cancer in her sisters; Colon cancer in her mother; Diverticulitis in her sisters; and Heart disease in her brother and maternal uncle.  There is no history of Anesthesia problems.      Objective:   Physical Exam Well-developed elderly white female in no acute distress, very pleasant blood pressure 130/78 pulse 60 one height 5 foot 3 weight 140. HEENT; nontraumatic normocephalic EOMI PERRLA sclera anicteric, Supple; no JVD, Cardiovascular; regular rate and rhythm with S1-S2 no murmur or gallop, Pulmonary; clear bilaterally, Abdomen ;soft basically nontender there is no palpable mass or hepatosplenomegaly bowel sounds are active, Rectal; exam no external lesion noted or hemorrhoids, on anoscopy she is nontender to exam and does have 1 internal hemorrhoid , somewhat friable. I cannot palpate a fissure on digital exam. Extremities; no clubbing cyanosis or edema skin warm and dry, Psych; mood and affect normal and appropriate.       Assessment & Plan:  #52 79 year old female with 2-3 week history of rectal discomfort. No fissure identified on exam today she does have an internal hemorrhoid. #2 positive family history of colon cancer in patient's mother-patient up-to-date with colonoscopy last exam August 2011 will need followup in 5 year interval #3 diverticular disease  #4 personal history of breast cancer x2, currently on tamoxifen #5 weight loss and anorexia-suspect this may be a side effect of tamoxifen, however with generalized vague dyspepsia will workup further #5 B12 deficiency #6 chronic GERD  Plan; start Anusol-HC suppositories at  bedtime x2 weeks then as needed She will use Analpram cream internal he after bowel movements and once or twice during the day over the next 2 weeks, then as needed She'll followup with Dr. Dickie La if her rectal discomfort is persisting and at that point may need to consider followup colonoscopy and treatment for internal hemorrhoids Check BMET today Schedule for CT of the abdomen and pelvis, further plans pending results of above.

## 2012-11-14 NOTE — Progress Notes (Signed)
Reviewed and agree.

## 2012-11-14 NOTE — Patient Instructions (Addendum)
Please go to the basement level to have your labs drawn.  We sent a prescription for Anusol HC suppositories at bedtime for 14 days. Use Analpram cream internally after a bowel movement.   You have been scheduled for a CT scan of the abdomen and pelvis at [facility] ([address]---this is in the same building as [facility]).   You are scheduled on 11-16-2012 at 8:15 am  You should arrive 15 minutes prior to your appointment time for registration. Please follow the written instructions below on the day of your exam:  WARNING: IF YOU ARE ALLERGIC TO IODINE/X-RAY DYE, PLEASE NOTIFY RADIOLOGY IMMEDIATELY AT [phone]! YOU WILL BE GIVEN A 13 HOUR PREMEDICATION PREP.  1) Do not eat or drink anything after 4:30 am  (4 hours prior to your test) 2) You have been given 2 bottles of oral contrast to drink. The solution may taste better if refrigerated, but do NOT add ice or any other liquid to this solution. Shake well before drinking.    Drink 1 bottle of contrast @ 6:30 am (2 hours prior to your exam)  Drink 1 bottle of contrast @ 7:30 am  (1 hour prior to your exam)  You may take any medications as prescribed with a small amount of water except for the following: Metformin, Glucophage, Glucovance, Avandamet, Riomet, Fortamet, Actoplus Met, Janumet, Glumetza or Metaglip. The above medications must be held the day of the exam AND 48 hours after the exam.  The purpose of you drinking the oral contrast is to aid in the visualization of your intestinal tract. The contrast solution may cause some diarrhea. Before your exam is started, you will be given a small amount of fluid to drink. Depending on your individual set of symptoms, you may also receive an intravenous injection of x-ray contrast/dye. Plan on being at [facility] for 30 minutes or long, depending on the type of exam you are having performed.  If you have any questions regarding your exam or if you need to  reschedule, you may call the CT department at [phone] between the hours of 8:00 am and 5:00 pm, Monday-Friday.  ________________________________________________________________________

## 2012-11-14 NOTE — Telephone Encounter (Signed)
Patient notified of Dr. Brodie's recommendation 

## 2012-11-16 ENCOUNTER — Ambulatory Visit (INDEPENDENT_AMBULATORY_CARE_PROVIDER_SITE_OTHER)
Admission: RE | Admit: 2012-11-16 | Discharge: 2012-11-16 | Disposition: A | Discharge status: Home / Self Care | Payer: Medicare Other | Source: Ambulatory Visit | Attending: Physician Assistant | Admitting: Physician Assistant

## 2012-11-16 DIAGNOSIS — F5000 Anorexia nervosa, unspecified: Secondary | ICD-10-CM | POA: Diagnosis not present

## 2012-11-16 DIAGNOSIS — R634 Abnormal weight loss: Secondary | ICD-10-CM | POA: Diagnosis not present

## 2012-11-16 DIAGNOSIS — Z853 Personal history of malignant neoplasm of breast: Secondary | ICD-10-CM

## 2012-11-16 DIAGNOSIS — R109 Unspecified abdominal pain: Secondary | ICD-10-CM | POA: Diagnosis not present

## 2012-11-16 MED ORDER — IOHEXOL 300 MG/ML  SOLN
100.0000 mL | Freq: Once | INTRAMUSCULAR | Status: AC | PRN
  Administered 2012-11-16: 100 mL via INTRAVENOUS

## 2012-11-17 ENCOUNTER — Telehealth: Payer: Self-pay | Admitting: *Deleted

## 2012-11-17 NOTE — Telephone Encounter (Signed)
Per md she can still get injection & she will monitor. Inform pt with md response. Transferred to schedulers to make appt for b12...lmb

## 2012-11-17 NOTE — Telephone Encounter (Signed)
Pt states Dr. Juanda Chance told her to stop taking B2 injections since her levels are back in normal range. Pt is not wanting to stop taking she states in order for her to feel good she has to keep taking. Wanting to know can md take over giving her injections...lmb

## 2012-11-20 ENCOUNTER — Ambulatory Visit (INDEPENDENT_AMBULATORY_CARE_PROVIDER_SITE_OTHER): Payer: Medicare Other | Admitting: *Deleted

## 2012-11-20 ENCOUNTER — Other Ambulatory Visit: Payer: Medicare Other

## 2012-11-20 DIAGNOSIS — E538 Deficiency of other specified B group vitamins: Secondary | ICD-10-CM | POA: Diagnosis not present

## 2012-11-20 MED ORDER — CYANOCOBALAMIN 1000 MCG/ML IJ SOLN
1000.0000 ug | Freq: Once | INTRAMUSCULAR | Status: AC
  Administered 2012-11-20: 1000 ug via INTRAMUSCULAR

## 2012-11-29 ENCOUNTER — Other Ambulatory Visit: Payer: Self-pay | Admitting: Cardiology

## 2012-12-05 ENCOUNTER — Ambulatory Visit: Payer: Medicare Other | Admitting: Internal Medicine

## 2012-12-20 ENCOUNTER — Ambulatory Visit: Payer: Medicare Other | Admitting: Internal Medicine

## 2012-12-26 ENCOUNTER — Other Ambulatory Visit: Payer: Self-pay | Admitting: Cardiology

## 2012-12-26 ENCOUNTER — Other Ambulatory Visit (INDEPENDENT_AMBULATORY_CARE_PROVIDER_SITE_OTHER): Payer: Medicare Other

## 2012-12-26 ENCOUNTER — Encounter: Payer: Self-pay | Admitting: Internal Medicine

## 2012-12-26 ENCOUNTER — Ambulatory Visit (INDEPENDENT_AMBULATORY_CARE_PROVIDER_SITE_OTHER): Payer: Medicare Other | Admitting: Internal Medicine

## 2012-12-26 VITALS — BP 140/72 | HR 57 | Temp 97.5°F | Wt 144.2 lb

## 2012-12-26 DIAGNOSIS — Z23 Encounter for immunization: Secondary | ICD-10-CM | POA: Diagnosis not present

## 2012-12-26 DIAGNOSIS — E119 Type 2 diabetes mellitus without complications: Secondary | ICD-10-CM

## 2012-12-26 DIAGNOSIS — E538 Deficiency of other specified B group vitamins: Secondary | ICD-10-CM

## 2012-12-26 DIAGNOSIS — E782 Mixed hyperlipidemia: Secondary | ICD-10-CM | POA: Diagnosis not present

## 2012-12-26 LAB — LIPID PANEL
HDL: 58.2 mg/dL (ref >39.00)
VLDL: 21.2 mg/dL (ref 0.0–40.0)

## 2012-12-26 LAB — LDL CHOLESTEROL, DIRECT: Direct LDL: 128.3 mg/dL

## 2012-12-26 MED ORDER — METFORMIN HCL ER 500 MG PO TB24: 500.0000 mg | ORAL_TABLET | Freq: Every day | ORAL | Status: DC

## 2012-12-26 MED ORDER — CYANOCOBALAMIN 1000 MCG/ML IJ SOLN
1000.0000 ug | Freq: Once | INTRAMUSCULAR | Status: AC
  Administered 2012-12-26: 1000 ug via INTRAMUSCULAR

## 2012-12-26 NOTE — Progress Notes (Signed)
Subjective:    Patient ID: Tami Jacobson, female    DOB: 08-29-1927, 79 y.o.   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 denies adverse side effects related to current therapy. symptoms of PVCs managed with flecanide   GERD with B12 defic - follows closely with GI - regular shots for B12 replacment and takes PPI once daily - reports compliance with ongoing medical treatment and no changes in medication dose or frequency. denies adverse side effects related to current therapy.     hx hypothyroid - on synthroid > 55yr but none in past 6-45yr - denies skin or weight changes , no depression or mood changes- normal TSH last few years   dyslipidemia - intol of all statins - no specific med tx at this time - tries to monitor with diet and exercise, esp with spouse's DM diet   left ear and head pain summer 2011- mri brain unremarkable - tx by ent (s/p tube in ear) and PT -  no recurrence   DM2, began metformin 12/2011 - infreq home cbg check - reports compliance with ongoing medical treatment and no changes in medication dose or frequency. denies adverse side effects related to current therapy.    Past Medical History  Diagnosis Date  . Vitamin B12 deficiency   . Carcinoma in situ of breast     R 06/2006, L 06/2008, L 06/2011 LCIS  . Esophageal stricture   . Diverticulosis   . Hypothyroid   . Hyperlipidemia     intol of all statins  . Anemia   . Gastritis   . Alopecia   . Carotid bruit   . Premature ventricular contraction   . PAC (premature atrial contraction)   . Diabetes mellitus     controlled by diet   . GERD (gastroesophageal reflux disease)   . Arthritis     Review of Systems  Constitutional: Positive for fatigue. Negative for fever and unexpected weight change.  Respiratory: Negative for shortness of breath and wheezing.   Cardiovascular: Negative for chest pain and palpitations.  Neurological: Negative for headaches.       Objective:   Physical Exam BP 140/72  Pulse 57  Temp(Src) 97.5 F (36.4 C) (Oral)  Wt 144 lb 3.2 oz (65.409 kg)  BMI 25.55 kg/m2  SpO2 97% Wt Readings from Last 3 Encounters:  12/26/12 144 lb 3.2 oz (65.409 kg)  11/14/12 140 lb (63.504 kg)  07/27/12 141 lb (63.957 kg)   Constitutional: Tami Jacobson appears well-developed and well-nourished. No distress. spouse at side Neck: Normal range of motion. Neck supple. No JVD present. No thyromegaly present.  Cardiovascular: Normal rate, regular rhythm and normal heart sounds. freq PVC appeciated  No murmur heard. No BLE edema. Pulmonary/Chest: Effort normal and breath sounds normal. No respiratory distress. Tami Jacobson has no wheezes.  Skin: Skin is warm and dry. No rash noted. No erythema.  Psychiatric: Tami Jacobson has a normal mood and affect. Her behavior is normal. Judgment and thought content normal.   Lab Results  Component Value Date   WBC 5.0 07/11/2012   HGB 12.0 07/11/2012  HCT 36.0 07/11/2012   PLT 123* 07/11/2012   CHOL 200 06/15/2011   TRIG 131.0 06/15/2011   HDL 48.30 06/15/2011   LDLDIRECT 136.1 01/02/2007   ALT 14 07/11/2012   AST 15 07/11/2012   NA 142 11/14/2012   K 4.5 11/14/2012   CL 104 11/14/2012   CREATININE 0.9 11/14/2012   BUN 12 11/14/2012   CO2 30 11/14/2012   TSH 1.63 12/14/2011   HGBA1C 5.7 06/14/2012   MICROALBUR 0.9 06/14/2012      Assessment & Plan:   see problem list. Medications and labs reviewed today.

## 2012-12-26 NOTE — Patient Instructions (Signed)
It was good to see you today. We have reviewed your prior records including labs and tests today Your annual flu shot was given and/or updated today. Test(s) ordered today. Your results will be released to MyChart (or called to you) after review, usually within 72hours after test completion. If any changes need to be made, you will be notified at that same time. Medications reviewed and updated, no changes recommended at this time. Please schedule followup in 6 months for diabetes mellitus check, call sooner if problems.

## 2012-12-26 NOTE — Assessment & Plan Note (Signed)
intol of all statins Will check today to monitor same  encouraged improvement in lifestyle control efforts with diet and exercise

## 2012-12-26 NOTE — Assessment & Plan Note (Signed)
Diet controlled until 12/2011- now on metformin check a1c and monitor same, tx as ongoing Intol of statins Lab Results  Component Value Date   HGBA1C 5.7 06/14/2012

## 2012-12-27 ENCOUNTER — Telehealth: Payer: Self-pay | Admitting: *Deleted

## 2012-12-27 NOTE — Telephone Encounter (Signed)
Pt called requesting lab results. Please advise.  

## 2012-12-27 NOTE — Telephone Encounter (Signed)
All labs ok - no changes recommended

## 2012-12-28 NOTE — Telephone Encounter (Signed)
Spoke with pt, advised of result note. 

## 2012-12-28 NOTE — Telephone Encounter (Signed)
Called pt x 3, line busy.

## 2013-01-01 DIAGNOSIS — L821 Other seborrheic keratosis: Secondary | ICD-10-CM | POA: Diagnosis not present

## 2013-01-01 DIAGNOSIS — L570 Actinic keratosis: Secondary | ICD-10-CM | POA: Diagnosis not present

## 2013-01-01 DIAGNOSIS — Z85828 Personal history of other malignant neoplasm of skin: Secondary | ICD-10-CM | POA: Diagnosis not present

## 2013-01-26 ENCOUNTER — Ambulatory Visit (INDEPENDENT_AMBULATORY_CARE_PROVIDER_SITE_OTHER): Payer: Medicare Other

## 2013-01-26 DIAGNOSIS — E538 Deficiency of other specified B group vitamins: Secondary | ICD-10-CM | POA: Diagnosis not present

## 2013-01-26 MED ORDER — CYANOCOBALAMIN 1000 MCG/ML IJ SOLN
1000.0000 ug | Freq: Once | INTRAMUSCULAR | Status: AC
  Administered 2013-01-26: 1000 ug via INTRAMUSCULAR

## 2013-01-27 ENCOUNTER — Other Ambulatory Visit: Payer: Self-pay | Admitting: Cardiology

## 2013-02-14 ENCOUNTER — Encounter: Payer: Self-pay | Admitting: Internal Medicine

## 2013-02-14 DIAGNOSIS — E119 Type 2 diabetes mellitus without complications: Secondary | ICD-10-CM | POA: Diagnosis not present

## 2013-02-14 DIAGNOSIS — H52209 Unspecified astigmatism, unspecified eye: Secondary | ICD-10-CM | POA: Diagnosis not present

## 2013-02-14 DIAGNOSIS — H5000 Unspecified esotropia: Secondary | ICD-10-CM | POA: Diagnosis not present

## 2013-02-14 DIAGNOSIS — Z961 Presence of intraocular lens: Secondary | ICD-10-CM | POA: Diagnosis not present

## 2013-02-14 LAB — HM DIABETES EYE EXAM

## 2013-02-20 ENCOUNTER — Telehealth: Payer: Self-pay | Admitting: *Deleted

## 2013-02-20 MED ORDER — FREESTYLE LANCETS MISC: Status: DC

## 2013-02-20 MED ORDER — GLUCOSE BLOOD VI STRP: ORAL_STRIP | Status: DC

## 2013-02-20 NOTE — Telephone Encounter (Signed)
Sent both strips & lancets to [facility]...Raechel Chute

## 2013-02-20 NOTE — Telephone Encounter (Signed)
Pt states she is needing rx for freestyle testing strips

## 2013-02-27 ENCOUNTER — Ambulatory Visit: Payer: Medicare Other

## 2013-03-05 ENCOUNTER — Ambulatory Visit (INDEPENDENT_AMBULATORY_CARE_PROVIDER_SITE_OTHER): Payer: Medicare Other | Admitting: Internal Medicine

## 2013-03-05 ENCOUNTER — Ambulatory Visit (INDEPENDENT_AMBULATORY_CARE_PROVIDER_SITE_OTHER)
Admission: RE | Admit: 2013-03-05 | Discharge: 2013-03-05 | Disposition: A | Discharge status: Home / Self Care | Payer: Medicare Other | Source: Ambulatory Visit | Attending: Internal Medicine | Admitting: Internal Medicine

## 2013-03-05 ENCOUNTER — Encounter: Payer: Self-pay | Admitting: Internal Medicine

## 2013-03-05 ENCOUNTER — Ambulatory Visit (INDEPENDENT_AMBULATORY_CARE_PROVIDER_SITE_OTHER): Payer: Medicare Other

## 2013-03-05 VITALS — BP 138/74 | HR 68 | Temp 97.7°F | Resp 16 | Ht 63.0 in | Wt 143.0 lb

## 2013-03-05 DIAGNOSIS — R05 Cough: Secondary | ICD-10-CM | POA: Insufficient documentation

## 2013-03-05 DIAGNOSIS — E538 Deficiency of other specified B group vitamins: Secondary | ICD-10-CM

## 2013-03-05 DIAGNOSIS — R059 Cough, unspecified: Secondary | ICD-10-CM | POA: Insufficient documentation

## 2013-03-05 DIAGNOSIS — J209 Acute bronchitis, unspecified: Secondary | ICD-10-CM | POA: Insufficient documentation

## 2013-03-05 MED ORDER — PROMETHAZINE-DM 6.25-15 MG/5ML PO SYRP: 5.0000 mL | ORAL_SOLUTION | Freq: Four times a day (QID) | ORAL | Status: DC | PRN

## 2013-03-05 MED ORDER — CYANOCOBALAMIN 1000 MCG/ML IJ SOLN
1000.0000 ug | Freq: Once | INTRAMUSCULAR | Status: AC
  Administered 2013-03-05: 1000 ug via INTRAMUSCULAR

## 2013-03-05 MED ORDER — AZITHROMYCIN 500 MG PO TABS: 500.0000 mg | ORAL_TABLET | Freq: Every day | ORAL | Status: DC

## 2013-03-05 NOTE — Progress Notes (Signed)
Subjective:    Patient ID: Tami Jacobson, female    DOB: 1927-07-09, 79 y.o.   MRN: [id number]  Cough This is a new problem. The current episode started 1 to 4 weeks ago. The problem has been gradually worsening. The problem occurs every few hours. The cough is productive of purulent sputum. Associated symptoms include a sore throat. Pertinent negatives include no chest pain, chills, ear congestion, ear pain, fever, headaches, heartburn, hemoptysis, myalgias, nasal congestion, postnasal drip, rash, rhinorrhea, shortness of breath, sweats, weight loss or wheezing. She has tried OTC cough suppressant for the symptoms. The treatment provided mild relief. There is no history of asthma, bronchiectasis, bronchitis, COPD, emphysema, environmental allergies or pneumonia.      Review of Systems  Constitutional: Negative.  Negative for fever, chills, weight loss, diaphoresis, activity change, appetite change, fatigue and unexpected weight change.  HENT: Positive for sore throat. Negative for ear pain, postnasal drip, rhinorrhea, sinus pressure, tinnitus, trouble swallowing and voice change.   Eyes: Negative.   Respiratory: Positive for cough. Negative for apnea, hemoptysis, choking, chest tightness, shortness of breath, wheezing and stridor.   Cardiovascular: Negative.  Negative for chest pain, palpitations and leg swelling.  Gastrointestinal: Negative.  Negative for heartburn, vomiting, abdominal pain, diarrhea and constipation.  Endocrine: Negative.   Genitourinary: Negative.   Musculoskeletal: Negative.  Negative for myalgias.  Skin: Negative.  Negative for rash.  Allergic/Immunologic: Negative.  Negative for environmental allergies.  Neurological: Negative.  Negative for dizziness, tremors, syncope, weakness, light-headedness and headaches.  Hematological: Negative.  Negative for adenopathy. Does not bruise/bleed easily.  Psychiatric/Behavioral: Negative.        Objective:   Physical Exam   Vitals reviewed. Constitutional: She is oriented to person, place, and time. She appears well-developed and well-nourished.  Non-toxic appearance. She does not have a sickly appearance. She does not appear ill. No distress.  HENT:  Head: Normocephalic and atraumatic.  Mouth/Throat: Oropharynx is clear and moist. No oropharyngeal exudate.  Eyes: Conjunctivae are normal. Right eye exhibits no discharge. Left eye exhibits no discharge. No scleral icterus.  Neck: Normal range of motion. Neck supple. No JVD present. No tracheal deviation present. No thyromegaly present.  Cardiovascular: Normal rate, regular rhythm, normal heart sounds and intact distal pulses.  Exam reveals no gallop and no friction rub.   No murmur heard. Pulmonary/Chest: Effort normal and breath sounds normal. No accessory muscle usage or stridor. Not tachypneic. No respiratory distress. She has no decreased breath sounds. She has no wheezes. She has no rhonchi. She has no rales. She exhibits no tenderness.  Abdominal: Soft. Bowel sounds are normal. She exhibits no distension and no mass. There is no tenderness. There is no rebound and no guarding.  Musculoskeletal: Normal range of motion. She exhibits no edema and no tenderness.  Lymphadenopathy:    She has no cervical adenopathy.  Neurological: She is oriented to person, place, and time.  Skin: Skin is warm and dry. No rash noted. She is not diaphoretic. No erythema. No pallor.  Psychiatric: She has a normal mood and affect. Her behavior is normal. Judgment and thought content normal.     Lab Results  Component Value Date   WBC 5.0 07/11/2012   HGB 12.0 07/11/2012   HCT 36.0 07/11/2012   PLT 123* 07/11/2012   GLUCOSE 125* 11/14/2012   CHOL 205* 12/26/2012   TRIG 106.0 12/26/2012   HDL 58.20 12/26/2012   LDLDIRECT 128.3 12/26/2012   LDLCALC 126* 06/15/2011   ALT  14 07/11/2012   AST 15 07/11/2012   NA 142 11/14/2012   K 4.5 11/14/2012   CL 104 11/14/2012   CREATININE 0.9 11/14/2012    BUN 12 11/14/2012   CO2 30 11/14/2012   TSH 1.63 12/14/2011   HGBA1C 6.3 12/26/2012   MICROALBUR 0.9 06/14/2012       Assessment & Plan:

## 2013-03-05 NOTE — Assessment & Plan Note (Signed)
I will check a CXR to see if there is PNA, mass, edema

## 2013-03-05 NOTE — Assessment & Plan Note (Signed)
I will treat the infection with Zpak and control the cough with phenergan-dm

## 2013-03-05 NOTE — Patient Instructions (Signed)
Acute Bronchitis Bronchitis is inflammation of the airways that extend from the windpipe into the lungs (bronchi). The inflammation often causes mucus to develop. This leads to a cough, which is the most common symptom of bronchitis.  In acute bronchitis, the condition usually develops suddenly and goes away over time, usually in a couple weeks. Smoking, allergies, and asthma can make bronchitis worse. Repeated episodes of bronchitis may cause further lung problems.  CAUSES Acute bronchitis is most often caused by the same virus that causes a cold. The virus can spread from person to person (contagious).  SIGNS AND SYMPTOMS   Cough.   Fever.   Coughing up mucus.   Body aches.   Chest congestion.   Chills.   Shortness of breath.   Sore throat.  DIAGNOSIS  Acute bronchitis is usually diagnosed through a physical exam. Tests, such as chest X-rays, are sometimes done to rule out other conditions.  TREATMENT  Acute bronchitis usually goes away in a couple weeks. Often times, no medical treatment is necessary. Medicines are sometimes given for relief of fever or cough. Antibiotics are usually not needed but may be prescribed in certain situations. In some cases, an inhaler may be recommended to help reduce shortness of breath and control the cough. A cool mist vaporizer may also be used to help thin bronchial secretions and make it easier to clear the chest.  HOME CARE INSTRUCTIONS  Get plenty of rest.   Drink enough fluids to keep your urine clear or pale yellow (unless you have a medical condition that requires fluid restriction). Increasing fluids may help thin your secretions and will prevent dehydration.   Only take over-the-counter or prescription medicines as directed by your health care provider.   Avoid smoking and secondhand smoke. Exposure to cigarette smoke or irritating chemicals will make bronchitis worse. If you are a smoker, consider using nicotine gum or skin  patches to help control withdrawal symptoms. Quitting smoking will help your lungs heal faster.   Reduce the chances of another bout of acute bronchitis by washing your hands frequently, avoiding people with cold symptoms, and trying not to touch your hands to your mouth, nose, or eyes.   Follow up with your health care provider as directed.  SEEK MEDICAL CARE IF: Your symptoms do not improve after 1 week of treatment.  SEEK IMMEDIATE MEDICAL CARE IF:  You develop an increased fever or chills.   You have chest pain.   You have severe shortness of breath.  You have bloody sputum.   You develop dehydration.  You develop fainting.  You develop repeated vomiting.  You develop a severe headache. MAKE SURE YOU:   Understand these instructions.  Will watch your condition.  Will get help right away if you are not doing well or get worse. Document Released: 04/29/2004 Document Revised: 11/22/2012 Document Reviewed: 09/12/2012 [employer] Patient Information 2014 [employer].  

## 2013-03-05 NOTE — Progress Notes (Signed)
Pre visit review using our clinic review tool, if applicable. No additional management support is needed unless otherwise documented below in the visit note. 

## 2013-03-25 ENCOUNTER — Other Ambulatory Visit: Payer: Self-pay | Admitting: Cardiology

## 2013-03-26 ENCOUNTER — Ambulatory Visit (INDEPENDENT_AMBULATORY_CARE_PROVIDER_SITE_OTHER): Payer: Medicare Other | Admitting: Internal Medicine

## 2013-03-26 ENCOUNTER — Encounter: Payer: Self-pay | Admitting: Internal Medicine

## 2013-03-26 VITALS — BP 130/62 | HR 68 | Temp 97.6°F | Wt 145.4 lb

## 2013-03-26 DIAGNOSIS — J309 Allergic rhinitis, unspecified: Secondary | ICD-10-CM

## 2013-03-26 DIAGNOSIS — J209 Acute bronchitis, unspecified: Secondary | ICD-10-CM

## 2013-03-26 MED ORDER — FLUTICASONE PROPIONATE 50 MCG/ACT NA SUSP: 2.0000 | Freq: Every day | NASAL | Status: DC

## 2013-03-26 NOTE — Progress Notes (Signed)
Subjective:    Patient ID: Tami Jacobson, female    DOB: 1928-01-15, 79 y.o.   MRN: [id number]  HPI  Here for followup on bronchitis. 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 denies adverse side effects related to current therapy. symptoms of PVCs managed with flecanide   GERD with B12 defic - follows closely with GI - regular shots for B12 replacment and takes PPI once daily - reports compliance with ongoing medical treatment and no changes in medication dose or frequency. denies adverse side effects related to current therapy.     hx hypothyroid - on synthroid > 46yr but none in past 6-47yr - denies skin or weight changes , no depression or mood changes- normal TSH last few years   dyslipidemia - intol of all statins - no specific med tx at this time - tries to monitor with diet and exercise, esp with spouse's DM diet   left ear and head pain summer 2011- mri brain unremarkable - tx by ent (s/p tube in ear) and PT -  no recurrence   DM2, began metformin 12/2011 - infreq home cbg check - reports compliance with ongoing medical treatment and no changes in medication dose or frequency. denies adverse side effects related to current therapy.    Past Medical History  Diagnosis Date  . Vitamin B12 deficiency   . Carcinoma in situ of breast     R 06/2006, L 06/2008, L 06/2011 LCIS  . Esophageal stricture   . Diverticulosis   . Hypothyroid   . Hyperlipidemia     intol of all statins  . Anemia   . Gastritis   . Alopecia   . Carotid bruit   . Premature  ventricular contraction   . PAC (premature atrial contraction)   . Diabetes mellitus     controlled by diet  . GERD (gastroesophageal reflux disease)   . Arthritis     Review of Systems  Constitutional: Negative for fever and fatigue.  HENT: Positive for postnasal drip and rhinorrhea.   Eyes: Positive for discharge (clear tears from eyes and R nostril).  Respiratory: Negative for cough and shortness of breath.   Cardiovascular: Negative for chest pain, palpitations and leg swelling.       Objective:   Physical Exam BP 130/62  Pulse 68  Temp(Src) 97.6 F (36.4 C) (Oral)  Wt 145 lb 6.4 oz (65.953 kg)  SpO2 93% Wt Readings from Last 3 Encounters:  03/26/13 145 lb 6.4 oz (65.953 kg)  03/05/13 143 lb (64.864 kg)  12/26/12 144 lb 3.2 oz (65.409 kg)   Constitutional: She appears well-developed and well-nourished. No distress.  Neck: Normal range of motion. Neck supple. No JVD present. No thyromegaly present.  Cardiovascular: Normal rate, regular rhythm and normal heart sounds.  No murmur heard. No BLE edema. Pulmonary/Chest: Effort normal and breath sounds normal. No respiratory distress. She has no wheezes.  Skin: Skin is warm and dry. No rash noted. No erythema.  Psychiatric: She has a normal mood and affect. Her behavior is normal. Judgment  and thought content normal.   Lab Results  Component Value Date   WBC 5.0 07/11/2012   HGB 12.0 07/11/2012   HCT 36.0 07/11/2012   PLT 123* 07/11/2012   GLUCOSE 125* 11/14/2012   CHOL 205* 12/26/2012   TRIG 106.0 12/26/2012   HDL 58.20 12/26/2012   LDLDIRECT 128.3 12/26/2012   LDLCALC 126* 06/15/2011   ALT 14 07/11/2012   AST 15 07/11/2012   NA 142 11/14/2012   K 4.5 11/14/2012   CL 104 11/14/2012   CREATININE 0.9 11/14/2012   BUN 12 11/14/2012   CO2 30 11/14/2012   TSH 1.63 12/14/2011   HGBA1C 6.3 12/26/2012   MICROALBUR 0.9 06/14/2012       Assessment & Plan:   Acute bronchitis 03/05/13 - resolved symptoms -  OV and CXR for same reviewed

## 2013-03-26 NOTE — Patient Instructions (Addendum)
It was good to see you today.  We have reviewed your prior records including labs and tests today  Medications reviewed and updated, start Flonase nose spray for allergy symptoms - no other changes recommended at this time.  Also ok for Claritin, Allegra or Zyrtec type medication if needed for allergy symptoms   Your prescription(s) have been submitted to your pharmacy. Please take as directed and contact our office if you believe you are having problem(s) with the medication(s).  Keep follow up in March 2015 as scheduled, call sooner if problems  Allergic Rhinitis Allergic rhinitis is when the mucous membranes in the nose respond to allergens. Allergens are particles in the air that cause your body to have an allergic reaction. This causes you to release allergic antibodies. Through a chain of events, these eventually cause you to release histamine into the blood stream (hence the use of antihistamines). Although meant to be protective to the body, it is this release that causes your discomfort, such as frequent sneezing, congestion and an itchy runny nose.  CAUSES  The pollen allergens may come from grasses, trees, and weeds. This is seasonal allergic rhinitis, or "hay fever." Other allergens cause year-round allergic rhinitis (perennial allergic rhinitis) such as house dust mite allergen, pet dander and mold spores.  SYMPTOMS   Nasal stuffiness (congestion).  Runny, itchy nose with sneezing and tearing of the eyes.  There is often an itching of the mouth, eyes and ears. It cannot be cured, but it can be controlled with medications. DIAGNOSIS  If you are unable to determine the offending allergen, skin or blood testing may find it. TREATMENT   Avoid the allergen.  Medications and allergy shots (immunotherapy) can help.  Hay fever may often be treated with antihistamines in pill or nasal spray forms. Antihistamines block the effects of histamine. There are over-the-counter medicines  that may help with nasal congestion and swelling around the eyes. Check with your caregiver before taking or giving this medicine. If the treatment above does not work, there are many new medications your caregiver can prescribe. Stronger medications may be used if initial measures are ineffective. Desensitizing injections can be used if medications and avoidance fails. Desensitization is when a patient is given ongoing shots until the body becomes less sensitive to the allergen. Make sure you follow up with your caregiver if problems continue. SEEK MEDICAL CARE IF:   You develop fever (more than 100.5 F (38.1 C).  You develop a cough that does not stop easily (persistent).  You have shortness of breath.  You start wheezing.  Symptoms interfere with normal daily activities. Document Released: 12/15/2000 Document Revised: 06/14/2011 Document Reviewed: 06/26/2008 [facility] Patient Information 2014 [place], [place].

## 2013-03-26 NOTE — Progress Notes (Signed)
Pre-visit discussion using our clinic review tool. No additional management support is needed unless otherwise documented below in the visit note.  

## 2013-04-09 ENCOUNTER — Ambulatory Visit (INDEPENDENT_AMBULATORY_CARE_PROVIDER_SITE_OTHER): Payer: Medicare Other | Admitting: *Deleted

## 2013-04-09 DIAGNOSIS — E538 Deficiency of other specified B group vitamins: Secondary | ICD-10-CM | POA: Diagnosis not present

## 2013-04-09 MED ORDER — CYANOCOBALAMIN 1000 MCG/ML IJ SOLN
1000.0000 ug | Freq: Once | INTRAMUSCULAR | Status: AC
  Administered 2013-04-09 (×2): 1000 ug via INTRAMUSCULAR

## 2013-04-12 ENCOUNTER — Telehealth: Payer: Self-pay | Admitting: Internal Medicine

## 2013-04-12 MED ORDER — OMEPRAZOLE 20 MG PO CPDR: 20.0000 mg | DELAYED_RELEASE_CAPSULE | Freq: Every day | ORAL | Status: DC

## 2013-04-12 NOTE — Telephone Encounter (Signed)
rx sent

## 2013-04-13 MED ORDER — OMEPRAZOLE 20 MG PO CPDR: 20.0000 mg | DELAYED_RELEASE_CAPSULE | Freq: Every day | ORAL | Status: DC

## 2013-04-13 NOTE — Telephone Encounter (Signed)
Rx for #90 with 1 refill sent to [facility] pharmacy. We were not advised on previous note that rx was to be sent anywhere but [facility].

## 2013-04-16 ENCOUNTER — Encounter: Payer: Self-pay | Admitting: Cardiology

## 2013-04-16 ENCOUNTER — Ambulatory Visit (INDEPENDENT_AMBULATORY_CARE_PROVIDER_SITE_OTHER): Payer: Medicare Other | Admitting: Cardiology

## 2013-04-16 VITALS — BP 144/50 | HR 67 | Ht 63.0 in | Wt 141.0 lb

## 2013-04-16 DIAGNOSIS — R0989 Other specified symptoms and signs involving the circulatory and respiratory systems: Secondary | ICD-10-CM | POA: Diagnosis not present

## 2013-04-16 DIAGNOSIS — I10 Essential (primary) hypertension: Secondary | ICD-10-CM | POA: Diagnosis not present

## 2013-04-16 NOTE — Assessment & Plan Note (Signed)
Symptoms controlled. Continue flecainide and Toprol.

## 2013-04-16 NOTE — Patient Instructions (Signed)
Your physician wants you to follow-up in: [place] will receive a reminder letter in the mail two months in advance. If you don't receive a letter, please call our office to schedule the follow-up appointment.   Your physician has requested that you have a carotid duplex. This test is an ultrasound of the carotid arteries in your neck. It looks at blood flow through these arteries that supply the brain with blood. Allow one hour for this exam. There are no restrictions or special instructions. CALL [phone] TO SCHEDULE

## 2013-04-16 NOTE — Progress Notes (Signed)
HPI: FU palpitations; history of PACs and PVCs with normal LV function. She had significant palpitations in the past despite beta-blockade. She is therefore on flecainide 50 mg p.o. b.i.d. A previous catheterization in June 2000 showed normal coronary arteries and normal LV function. Last Myoview in October of 2004 showed an ejection fraction of 70% with apical thinning and probable normal perfusion. Last echocardiogram in December of 2008 showed normal LV function with an ejection fraction of 70-75%, trivial mitral regurgitation and tricuspid regurgitation. Carotid Dopplers in July of 2012 showed 0-39% bilateral stenosis. I last saw her in Jan 2014. Since then the patient has dyspnea with more extreme activities but not with routine activities. It is relieved with rest. It is not associated with chest pain. There is no orthopnea, PND or pedal edema. There is no syncope or palpitations. There is no exertional chest pain.    Current Outpatient Prescriptions  Medication Sig Dispense Refill  . clobetasol cream (TEMOVATE) 2.70 % 1 application as needed. Irritated skin      . cyanocobalamin (,VITAMIN B-12,) 1000 MCG/ML injection Inject 1,000 mcg into the muscle every 30 (thirty) days.       . flecainide (TAMBOCOR) 50 MG tablet Take 1 tablet (50 mg total) by mouth 2 (two) times daily.  60 tablet  11  . fluticasone (FLONASE) 50 MCG/ACT nasal spray Place 2 sprays into both nostrils daily.  16 g  2  . glucose blood (FREESTYLE LITE) test strip Use to check blood sugar daily Dx 250.00  100 each  3  . hydrocortisone (ANUSOL-HC) 25 MG suppository Use 1 suppository at bedtime for 14 days.  14 suppository  2  . hydrocortisone-pramoxine (ANALPRAM-HC) 2.5-1 % rectal cream Place 1 application rectally 3 (three) times daily.  30 g  1  . Lancets (FREESTYLE) lancets Use to help check blood sugar daily Dx 250.00  100 each  3  . metFORMIN (GLUCOPHAGE XR) 500 MG 24 hr tablet Take 1 tablet (500 mg total) by mouth  daily with breakfast.  90 tablet  3  . metoprolol succinate (TOPROL-XL) 50 MG 24 hr tablet TAKE ONE TABLET BY MOUTH ONCE DAILY  30 tablet  6  . omeprazole (PRILOSEC) 20 MG capsule Take 1 capsule (20 mg total) by mouth daily.  90 capsule  1  . promethazine-dextromethorphan (PROMETHAZINE-DM) 6.25-15 MG/5ML syrup Take 5 mLs by mouth 4 (four) times daily as needed for cough.  118 mL  0  . tamoxifen (NOLVADEX) 20 MG tablet Take 1 tablet (20 mg total) by mouth daily.  90 tablet  12  . terconazole (TERAZOL 7) 0.4 % vaginal cream        No current facility-administered medications for this visit.     Past Medical History  Diagnosis Date  . Vitamin B12 deficiency   . Carcinoma in situ of breast     R 06/2006, L 06/2008, L 06/2011 LCIS  . Esophageal stricture   . Diverticulosis   . Hypothyroid   . Hyperlipidemia     intol of all statins  . Anemia   . Gastritis   . Alopecia   . Carotid bruit   . Premature ventricular contraction   . PAC (premature atrial contraction)   . Diabetes mellitus     controlled by diet  . GERD (gastroesophageal reflux disease)   . Arthritis     Past Surgical History  Procedure Laterality Date  . Abdominal hysterectomy  1979  . Appendectomy  2002  .  Cholecystectomy  2002  . Tonsillectomy  1942  . Esophageal dilation  2006  . Breast lumpectomy  2008, 2010    X 2  . Cataract extraction    . Breast biopsy  07/02/2011    Procedure: BREAST BIOPSY WITH NEEDLE LOCALIZATION;  Surgeon: Shann Medal, MD;  Location: [facility];  Service: General;  Laterality: Left;  left breast atypical hyperplasia needle localization biopsy    History   Social History  . Marital Status: Married    Spouse Name: N/A    Number of Children: N/A  . Years of Education: N/A   Occupational History  . Not on file.   Social History Main Topics  . Smoking status: Never Smoker   . Smokeless tobacco: Never Used  . Alcohol Use: No  . Drug Use: No  . Sexual Activity: Not on file   Other  Topics Concern  . Not on file   Social History Narrative   Retired Network engineer   Married, lives with spouse    ROS: no fevers or chills, productive cough, hemoptysis, dysphasia, odynophagia, melena, hematochezia, dysuria, hematuria, rash, seizure activity, orthopnea, PND, pedal edema, claudication. Remaining systems are negative.  Physical Exam: Well-developed well-nourished in no acute distress.  Skin is warm and dry.  HEENT is normal.  Neck is supple. Bilateral carotid bruits Chest is clear to auscultation with normal expansion.  Cardiovascular exam is regular rate and rhythm.  Abdominal exam nontender or distended. No masses palpated. Extremities show no edema. neuro grossly intact  ECG sinus rhythm at a rate of 67. RV conduction delay. Left axis deviation.

## 2013-04-16 NOTE — Assessment & Plan Note (Signed)
Patient with loud bilateral carotid bruits. Check carotid Dopplers.

## 2013-04-16 NOTE — Assessment & Plan Note (Signed)
Blood pressure controlled. Continue present medications. 

## 2013-04-26 ENCOUNTER — Other Ambulatory Visit: Payer: Self-pay | Admitting: Cardiology

## 2013-04-30 ENCOUNTER — Encounter: Payer: Self-pay | Admitting: Cardiology

## 2013-04-30 ENCOUNTER — Ambulatory Visit (HOSPITAL_COMMUNITY): Payer: Medicare Other | Attending: Cardiology

## 2013-04-30 DIAGNOSIS — E785 Hyperlipidemia, unspecified: Secondary | ICD-10-CM | POA: Insufficient documentation

## 2013-04-30 DIAGNOSIS — I6529 Occlusion and stenosis of unspecified carotid artery: Secondary | ICD-10-CM | POA: Insufficient documentation

## 2013-04-30 DIAGNOSIS — E119 Type 2 diabetes mellitus without complications: Secondary | ICD-10-CM | POA: Insufficient documentation

## 2013-04-30 DIAGNOSIS — I10 Essential (primary) hypertension: Secondary | ICD-10-CM | POA: Insufficient documentation

## 2013-04-30 DIAGNOSIS — R0989 Other specified symptoms and signs involving the circulatory and respiratory systems: Secondary | ICD-10-CM | POA: Insufficient documentation

## 2013-04-30 DIAGNOSIS — I658 Occlusion and stenosis of other precerebral arteries: Secondary | ICD-10-CM | POA: Diagnosis not present

## 2013-05-10 ENCOUNTER — Ambulatory Visit (INDEPENDENT_AMBULATORY_CARE_PROVIDER_SITE_OTHER): Payer: Medicare Other | Admitting: *Deleted

## 2013-05-10 ENCOUNTER — Telehealth: Payer: Self-pay | Admitting: *Deleted

## 2013-05-10 DIAGNOSIS — E538 Deficiency of other specified B group vitamins: Secondary | ICD-10-CM

## 2013-05-10 MED ORDER — CYANOCOBALAMIN 1000 MCG/ML IJ SOLN
1000.0000 ug | Freq: Once | INTRAMUSCULAR | Status: AC
  Administered 2013-05-10: 1000 ug via INTRAMUSCULAR

## 2013-05-10 NOTE — Telephone Encounter (Signed)
Message copied by Hulan Saas on Thu May 10, 2013  9:34 AM ------      Message from: Hulan Saas      Created: Fri Nov 10, 2012  8:56 AM       Call and remind patient due for B12 DB on 05/14/12. In EPIC ------

## 2013-05-10 NOTE — Telephone Encounter (Signed)
Patient will come for B12 level next week.

## 2013-05-11 ENCOUNTER — Telehealth: Payer: Self-pay | Admitting: Cardiology

## 2013-05-11 NOTE — Telephone Encounter (Signed)
Spoke with pt, she is concerned with the blockage she has in the carotid arteries and wonders if she should start taking apsirin daily. Will forward for dr Stanford Breed review

## 2013-05-11 NOTE — Telephone Encounter (Signed)
New Prob    Pt is inquiring about starting to take a baby aspirin daily. Please call.

## 2013-05-12 NOTE — Telephone Encounter (Signed)
Cerebrovascular disease very mild; ok to take ASA 81 mg daily Kirk Ruths

## 2013-05-14 MED ORDER — ASPIRIN EC 81 MG PO TBEC: 81.0000 mg | DELAYED_RELEASE_TABLET | Freq: Every day | ORAL | Status: DC

## 2013-05-14 NOTE — Telephone Encounter (Signed)
Spoke with pt, Aware of dr crenshaw's recommendations.  °

## 2013-05-16 ENCOUNTER — Other Ambulatory Visit: Payer: Self-pay | Admitting: Obstetrics and Gynecology

## 2013-05-16 DIAGNOSIS — Z853 Personal history of malignant neoplasm of breast: Secondary | ICD-10-CM

## 2013-05-23 ENCOUNTER — Encounter: Payer: Self-pay | Admitting: *Deleted

## 2013-05-28 ENCOUNTER — Other Ambulatory Visit: Payer: Self-pay | Admitting: Internal Medicine

## 2013-05-28 ENCOUNTER — Other Ambulatory Visit (INDEPENDENT_AMBULATORY_CARE_PROVIDER_SITE_OTHER): Payer: Medicare Other

## 2013-05-28 DIAGNOSIS — E538 Deficiency of other specified B group vitamins: Secondary | ICD-10-CM | POA: Diagnosis not present

## 2013-05-28 LAB — VITAMIN B12: Vitamin B-12: 445 pg/mL (ref 211–911)

## 2013-06-07 ENCOUNTER — Ambulatory Visit (INDEPENDENT_AMBULATORY_CARE_PROVIDER_SITE_OTHER): Payer: Medicare Other | Admitting: *Deleted

## 2013-06-07 DIAGNOSIS — E538 Deficiency of other specified B group vitamins: Secondary | ICD-10-CM | POA: Diagnosis not present

## 2013-06-07 MED ORDER — CYANOCOBALAMIN 1000 MCG/ML IJ SOLN
1000.0000 ug | Freq: Once | INTRAMUSCULAR | Status: AC
  Administered 2013-06-07: 1000 ug via INTRAMUSCULAR

## 2013-06-20 ENCOUNTER — Ambulatory Visit
Admission: RE | Admit: 2013-06-20 | Discharge: 2013-06-20 | Disposition: A | Discharge status: Home / Self Care | Payer: Medicare Other | Source: Ambulatory Visit | Attending: Obstetrics and Gynecology | Admitting: Obstetrics and Gynecology

## 2013-06-20 DIAGNOSIS — Z853 Personal history of malignant neoplasm of breast: Secondary | ICD-10-CM

## 2013-06-20 DIAGNOSIS — R922 Inconclusive mammogram: Secondary | ICD-10-CM | POA: Diagnosis not present

## 2013-06-25 DIAGNOSIS — Z0000 Encounter for general adult medical examination without abnormal findings: Secondary | ICD-10-CM | POA: Diagnosis not present

## 2013-06-25 DIAGNOSIS — L940 Localized scleroderma [morphea]: Secondary | ICD-10-CM | POA: Diagnosis not present

## 2013-06-25 DIAGNOSIS — Z01419 Encounter for gynecological examination (general) (routine) without abnormal findings: Secondary | ICD-10-CM | POA: Diagnosis not present

## 2013-06-25 DIAGNOSIS — C50919 Malignant neoplasm of unspecified site of unspecified female breast: Secondary | ICD-10-CM | POA: Diagnosis not present

## 2013-06-26 ENCOUNTER — Encounter: Payer: Self-pay | Admitting: Internal Medicine

## 2013-06-26 ENCOUNTER — Ambulatory Visit (INDEPENDENT_AMBULATORY_CARE_PROVIDER_SITE_OTHER): Payer: Medicare Other | Admitting: Internal Medicine

## 2013-06-26 ENCOUNTER — Other Ambulatory Visit (INDEPENDENT_AMBULATORY_CARE_PROVIDER_SITE_OTHER): Payer: Medicare Other

## 2013-06-26 VITALS — BP 130/62 | HR 59 | Temp 98.1°F | Wt 144.4 lb

## 2013-06-26 DIAGNOSIS — R5381 Other malaise: Secondary | ICD-10-CM | POA: Diagnosis not present

## 2013-06-26 DIAGNOSIS — E782 Mixed hyperlipidemia: Secondary | ICD-10-CM

## 2013-06-26 DIAGNOSIS — R5383 Other fatigue: Secondary | ICD-10-CM

## 2013-06-26 DIAGNOSIS — Z0000 Encounter for general adult medical examination without abnormal findings: Secondary | ICD-10-CM

## 2013-06-26 DIAGNOSIS — I6529 Occlusion and stenosis of unspecified carotid artery: Secondary | ICD-10-CM

## 2013-06-26 DIAGNOSIS — E119 Type 2 diabetes mellitus without complications: Secondary | ICD-10-CM

## 2013-06-26 LAB — CBC WITH DIFFERENTIAL/PLATELET
Basophils Absolute: 0 10*3/uL (ref 0.0–0.1)
Basophils Relative: 0.4 % (ref 0.0–3.0)
EOS PCT: 2.1 % (ref 0.0–5.0)
Eosinophils Absolute: 0.1 10*3/uL (ref 0.0–0.7)
HEMATOCRIT: 36.5 % (ref 36.0–46.0)
HEMOGLOBIN: 12.1 g/dL (ref 12.0–15.0)
LYMPHS ABS: 1.5 10*3/uL (ref 0.7–4.0)
Lymphocytes Relative: 26.9 % (ref 12.0–46.0)
MCHC: 33.3 g/dL (ref 30.0–36.0)
MCV: 95.9 fl (ref 78.0–100.0)
MONOS PCT: 8.3 % (ref 3.0–12.0)
Monocytes Absolute: 0.5 10*3/uL (ref 0.1–1.0)
Neutro Abs: 3.4 10*3/uL (ref 1.4–7.7)
Neutrophils Relative %: 62.3 % (ref 43.0–77.0)
Platelets: 131 10*3/uL — ABNORMAL LOW (ref 150.0–400.0)
RBC: 3.8 Mil/uL — AB (ref 3.87–5.11)
RDW: 13.6 % (ref 11.5–14.6)
WBC: 5.4 10*3/uL (ref 4.5–10.5)

## 2013-06-26 LAB — BASIC METABOLIC PANEL
BUN: 14 mg/dL (ref 6–23)
CALCIUM: 9.8 mg/dL (ref 8.4–10.5)
CHLORIDE: 105 meq/L (ref 96–112)
CO2: 29 meq/L (ref 19–32)
CREATININE: 0.9 mg/dL (ref 0.4–1.2)
GFR: 60.34 mL/min (ref >60.00)
GLUCOSE: 134 mg/dL — AB (ref 70–99)
Potassium: 4.6 mEq/L (ref 3.5–5.1)
Sodium: 143 mEq/L (ref 135–145)

## 2013-06-26 LAB — HEMOGLOBIN A1C: Hgb A1c MFr Bld: 6.1 % (ref 4.6–6.5)

## 2013-06-26 LAB — MICROALBUMIN / CREATININE URINE RATIO
Creatinine,U: 100.8 mg/dL
Microalb Creat Ratio: 0.5 mg/g (ref 0.0–30.0)
Microalb, Ur: 0.5 mg/dL (ref 0.0–1.9)

## 2013-06-26 LAB — LIPID PANEL
CHOLESTEROL: 205 mg/dL — AB (ref 0–200)
HDL: 54.7 mg/dL (ref >39.00)
LDL Cholesterol: 124 mg/dL — ABNORMAL HIGH (ref 0–99)
TRIGLYCERIDES: 133 mg/dL (ref 0.0–149.0)
Total CHOL/HDL Ratio: 4
VLDL: 26.6 mg/dL (ref 0.0–40.0)

## 2013-06-26 LAB — TSH: TSH: 1.79 u[IU]/mL (ref 0.35–5.50)

## 2013-06-26 NOTE — Progress Notes (Signed)
Subjective:    Patient ID: Tami Jacobson, female    DOB: 09/04/1927, 80 y.o.   MRN: [id number]  HPI Here for medicare wellness  Diet: heart healthy and diabetic  Physical activity: sedentary Depression/mood screen: negative Hearing: intact to whispered voice Visual acuity: grossly normal, performs annual eye exam  ADLs: capable Fall risk: none Home safety: good Cognitive evaluation: intact to orientation, naming, recall and repetition EOL planning: adv directives, full code/ I agree  I have personally reviewed and have noted 1. The patient's medical and social history 2. Their use of alcohol, tobacco or illicit drugs 3. Their current medications and supplements 4. The patient's functional ability including ADL's, fall risks, home safety risks and hearing or visual impairment. 5. Diet and physical activities 6. 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 denies adverse side effects related to current therapy. symptoms of PVCs managed with flecanide   GERD with B12 defic - follows closely with GI - regular shots for B12 replacment and takes PPI once daily - reports compliance with ongoing medical treatment and no changes in medication dose or frequency. denies adverse side effects related to current therapy.     hx hypothyroid - on synthroid > 35yr but none since 2005 - denies skin or weight changes , no depression or mood changes- normal TSH last few years   dyslipidemia - intol of all statins - no specific med tx at this time - tries to monitor with diet and exercise, esp with spouse's DM diet    DM2, began metformin 12/2011 - infreq home cbg check - reports compliance with ongoing medical treatment and no changes in medication dose or frequency. denies adverse side effects related to current therapy.      Past Medical History  Diagnosis Date  . Vitamin B12 deficiency   . Carcinoma in situ of breast     R 06/2006, L 06/2008, L 06/2011 LCIS  . Esophageal stricture   . Diverticulosis   . Hypothyroid   . Hyperlipidemia     intol of all statins  . Anemia   . Gastritis   . Alopecia   . Carotid bruit   . Premature ventricular contraction   . PAC (premature atrial contraction)   . Diabetes mellitus     controlled by diet  . GERD (gastroesophageal reflux disease)   . Arthritis    Family History  Problem Relation Age of Onset  . Colon cancer Mother   . Breast cancer Sister     x 3  . Diverticulitis Sister   . Cancer Sister     breast  . Heart disease Maternal Uncle     CABG  . Diverticulitis Sister   . Cancer Sister     leukemia  . Heart disease Brother   . Anesthesia problems Neg Hx    History  Substance Use Topics  . Smoking status: Never Smoker   . Smokeless tobacco: Never Used  . Alcohol Use: No    Review of Systems  Constitutional: Positive for fatigue. Negative for fever and unexpected weight change.  Respiratory: Negative for cough, shortness  of breath and wheezing.   Cardiovascular: Negative for chest pain, palpitations and leg swelling.  Gastrointestinal: Negative for nausea, abdominal pain and diarrhea.  Neurological: Negative for dizziness, weakness, light-headedness and headaches.  Psychiatric/Behavioral: Negative for dysphoric mood. The patient is not nervous/anxious.   All other systems reviewed and are negative.       Objective:   Physical Exam BP 130/62  Pulse 59  Temp(Src) 98.1 F (36.7 C) (Oral)  Wt 144 lb 6.4 oz (65.499 kg)  SpO2 96% Wt Readings from Last 3 Encounters:  06/26/13 144 lb 6.4 oz (65.499 kg)  04/16/13 141 lb (63.957 kg)   03/26/13 145 lb 6.4 oz (65.953 kg)   Constitutional: She appears well-developed and well-nourished. No distress. spouse at side HENT: normal TMs without cerumen/effusion - OP clear Neck: Normal range of motion. Neck supple. No JVD present. No thyromegaly present. B carotid bruits Cardiovascular: Normal rate, regular rhythm and normal heart sounds.  No murmur heard. No BLE edema. Pulmonary/Chest: Effort normal and breath sounds normal. No respiratory distress. She has no wheezes.  Skin: Skin is warm and dry. No rash noted. No erythema.  Psychiatric: She has a normal mood and affect. Her behavior is normal. Judgment and thought content normal.   Lab Results  Component Value Date   WBC 5.0 07/11/2012   HGB 12.0 07/11/2012   HCT 36.0 07/11/2012   PLT 123* 07/11/2012   CHOL 205* 12/26/2012   TRIG 106.0 12/26/2012   HDL 58.20 12/26/2012   LDLDIRECT 128.3 12/26/2012   ALT 14 07/11/2012   AST 15 07/11/2012   NA 142 11/14/2012   K 4.5 11/14/2012   CL 104 11/14/2012   CREATININE 0.9 11/14/2012   BUN 12 11/14/2012   CO2 30 11/14/2012   TSH 1.63 12/14/2011   HGBA1C 6.3 12/26/2012   MICROALBUR 0.9 06/14/2012      Assessment & Plan:  AWV/v70.0 - Today patient counseled on age appropriate routine health concerns for screening and prevention, each reviewed and up to date or declined. Immunizations reviewed and up to date or declined. Labs reviewed. Risk factors for depression reviewed and negative. Hearing function and visual acuity are intact. ADLs screened and addressed as needed. Functional ability and level of safety reviewed and appropriate. Education, counseling and referrals performed based on assessed risks today. Patient provided with a copy of personalized plan for preventive services.  Problem List Items Addressed This Visit   Carotid stenosis     Progressive ICAS on [place] 05/2013 r 60-79% No CVA symptoms  Work on risk reduction via med mgmt as tolerated    Relevant Orders      Lipid panel   DIABETES  MELLITUS, TYPE II      Diet controlled until 12/2011- then started metformin check a1c and monitor same, tx as ongoing Intol of statins Lab Results  Component Value Date   HGBA1C 6.3 12/26/2012      Relevant Orders      Hemoglobin A1c      Basic metabolic panel      Lipid panel      Microalbumin / creatinine urine ratio   HYPERLIPIDEMIA     intol of all statins i npast, but encouraged to reconsider trial given progressive ICAS on [place] 05/2013 Will check today to monitor same  encouraged improvement in lifestyle control efforts with diet and exercise    Relevant Orders      Lipid panel    Other Visit Diagnoses   Routine general medical examination  at a health care facility    -  Primary    Other malaise and fatigue        Relevant Orders       TSH       CBC with Differential       Fatigue - nonspecific symptoms/exam - check screening labs

## 2013-06-26 NOTE — Patient Instructions (Addendum)
It was good to see you today.  We have reviewed your prior records including labs and tests today  Health Maintenance reviewed - all recommended immunizations and age-appropriate screenings are up-to-date.  Test(s) ordered today. Your results will be released to [employer] [facility] (or called to you) after review, usually within 72hours after test completion. If any changes need to be made, you will be notified at that same time.  Medications reviewed and updated, no changes recommended at this time.  Please schedule followup in 6 months for semiannual exam and labs, call sooner if problems.  Diabetes and Small Vessel Disease Small vessel disease (microvascular disease) includes nephropathy, retinopathy, and neuropathy. People with diabetes are at risk for these problems, but keeping blood glucose (sugar) controlled is helpful in preventing problems. DIABETIC KIDNEY PROBLEMS (DIABETIC NEPHROPATHY)  Diabetic nephropathy occurs in many patients with diabetes.  Damage to the small vessels in the kidneys is the leading cause of end-stage renal disease (ESRD).  Protein in the urine (albuminuria) in the range of 30 to 300 mg/24 h (microalbuminuria) is a sign of the earliest stage of diabetic nephropathy.  Good blood glucose (sugar) and blood pressure control significantly reduce the progression of nephropathy. DIABETIC EYE PROBLEMS (DIABETIC RETINOPATHY)  Diabetic retinopathy is the most common cause of new cases of blindness in adults. It is related to the number of years you have had diabetes.  Common risk factors include high blood sugar (hyperglycemia), high blood pressure (hypertension), and poorly controlled blood lipids such as high blood cholesterol (hypercholesterolemia). DIABETIC NERVE PROBLEMS (DIABETIC NEUROPATHY) Diabetic neuropathy is the most common, long-term complication of diabetes. It is responsible for more than half of leg amputations not due to accidents. The main risk for developing  diabetic neuropathy seems to be uncontrolled blood sugars. Hyperglycemia damages the nerve fibers causing sensation (feeling) problems. The closer you can keep the following guidelines, the better chance you will have avoiding problems from small vessel disease.  Working toward near normal blood glucose or as normal as possible. You will need to keep your blood glucose and A1c at the target range prescribed by your caregiver.  Keep your blood pressure less than 120/80.  Keep your low-density lipoprotein (LDL) cholesterol (one of the fats in your blood) at less than 100 mg/dL. An LDL less than 70 mg/dL may be recommended for high risk patients. You cannot change your family history, but it is important to change the risk factors that you can. Risk factors you can control include:  Controlling high blood pressure.  Stopping smoking.  Using alcohol only in moderation. Generally, this means about one drink per day for women and two drinks per day for men.  Controlling your blood lipids (cholesterol and triglycerides).  Treating heart problems, if these are contributing to risk. SEEK MEDICAL CARE IF:   You are having problems keeping your blood glucose in goal range.  You notice a change in your vision or new problems with your vision.  You have wound or sore that does not heal.  Your blood pressure is above the target range. Document Released: 03/25/2003 Document Revised: 03/08/2012 Document Reviewed: 08/30/2008 [facility] Patient Information 2014 [place], [place].

## 2013-06-26 NOTE — Assessment & Plan Note (Signed)
Progressive ICAS on [place] 05/2013 r 60-79% No CVA symptoms  Work on risk reduction via med mgmt as tolerated

## 2013-06-26 NOTE — Assessment & Plan Note (Signed)
intol of all statins i npast, but encouraged to reconsider trial given progressive ICAS on [place] 05/2013 Will check today to monitor same  encouraged improvement in lifestyle control efforts with diet and exercise

## 2013-06-26 NOTE — Progress Notes (Signed)
Pre visit review using our clinic review tool, if applicable. No additional management support is needed unless otherwise documented below in the visit note. 

## 2013-06-26 NOTE — Assessment & Plan Note (Signed)
Diet controlled until 12/2011- then started metformin check a1c and monitor same, tx as ongoing Intol of statins Lab Results  Component Value Date   HGBA1C 6.3 12/26/2012

## 2013-06-27 ENCOUNTER — Ambulatory Visit (INDEPENDENT_AMBULATORY_CARE_PROVIDER_SITE_OTHER): Payer: Medicare Other | Admitting: Podiatry

## 2013-06-27 ENCOUNTER — Encounter: Payer: Self-pay | Admitting: Podiatry

## 2013-06-27 VITALS — BP 159/66 | HR 64 | Resp 12

## 2013-06-27 DIAGNOSIS — M79609 Pain in unspecified limb: Secondary | ICD-10-CM | POA: Diagnosis not present

## 2013-06-27 DIAGNOSIS — B351 Tinea unguium: Secondary | ICD-10-CM

## 2013-06-27 DIAGNOSIS — I6529 Occlusion and stenosis of unspecified carotid artery: Secondary | ICD-10-CM

## 2013-06-27 NOTE — Patient Instructions (Signed)
Diabetes and Foot Care Diabetes may cause you to have problems because of poor blood supply (circulation) to your feet and legs. This may cause the skin on your feet to become thinner, break easier, and heal more slowly. Your skin may become dry, and the skin may peel and crack. You may also have nerve damage in your legs and feet causing decreased feeling in them. You may not notice minor injuries to your feet that could lead to infections or more serious problems. Taking care of your feet is one of the most important things you can do for yourself.  HOME CARE INSTRUCTIONS  Wear shoes at all times, even in the house. Do not go barefoot. Bare feet are easily injured.  Check your feet daily for blisters, cuts, and redness. If you cannot see the bottom of your feet, use a mirror or ask someone for help.  Wash your feet with warm water (do not use hot water) and mild soap. Then pat your feet and the areas between your toes until they are completely dry. Do not soak your feet as this can dry your skin.  Apply a moisturizing lotion or petroleum jelly (that does not contain alcohol and is unscented) to the skin on your feet and to dry, brittle toenails. Do not apply lotion between your toes.  Trim your toenails straight across. Do not dig under them or around the cuticle. File the edges of your nails with an emery board or nail file.  Do not cut corns or calluses or try to remove them with medicine.  Wear clean socks or stockings every day. Make sure they are not too tight. Do not wear knee-high stockings since they may decrease blood flow to your legs.  Wear shoes that fit properly and have enough cushioning. To break in new shoes, wear them for just a few hours a day. This prevents you from injuring your feet. Always look in your shoes before you put them on to be sure there are no objects inside.  Do not cross your legs. This may decrease the blood flow to your feet.  If you find a minor scrape,  cut, or break in the skin on your feet, keep it and the skin around it clean and dry. These areas may be cleansed with mild soap and water. Do not cleanse the area with peroxide, alcohol, or iodine.  When you remove an adhesive bandage, be sure not to damage the skin around it.  If you have a wound, look at it several times a day to make sure it is healing.  Do not use heating pads or hot water bottles. They may burn your skin. If you have lost feeling in your feet or legs, you may not know it is happening until it is too late.  Make sure your health care provider performs a complete foot exam at least annually or more often if you have foot problems. Report any cuts, sores, or bruises to your health care provider immediately. SEEK MEDICAL CARE IF:   You have an injury that is not healing.  You have cuts or breaks in the skin.  You have an ingrown nail.  You notice redness on your legs or feet.  You feel burning or tingling in your legs or feet.  You have pain or cramps in your legs and feet.  Your legs or feet are numb.  Your feet always feel cold. SEEK IMMEDIATE MEDICAL CARE IF:   There is increasing redness,   swelling, or pain in or around a wound.  There is a red line that goes up your leg.  Pus is coming from a wound.  You develop a fever or as directed by your health care provider.  You notice a bad smell coming from an ulcer or wound. Document Released: 03/19/2000 Document Revised: 11/22/2012 Document Reviewed: 08/29/2012 Exit[employer] Patient Information 2014 [employer].  

## 2013-06-27 NOTE — Progress Notes (Signed)
   Subjective:    Patient ID: Tami Jacobson, female    DOB: January 11, 1928, 80 y.o.   MRN: [id number]  HPI PT STATED BOTH GREAT TOENAIL GREW BACK AND THEY BEEN HURTING FOR SEVERAL MONTHS. THE TOENAILS ARE GETTING WORSE. THE TOENAIL GET AGGRAVATED BY WEARING SHOES AND PRESSURE AND TRIED NO TREATMENT.  The hallux nails been removed previously for permanent correction.    Review of Systems  All other systems reviewed and are negative.       Objective:   Physical Exam  Orientated x3 space white female  Vascular: DP and PT pulses 2/4 bilaterally  Neurological: Sensation to 10 g monofilament wire intact 5/5 bilaterally. Vibratory sensation intact bilaterally. Ankle reflexes reactive bilaterally.  Dermatological: Residual hypertrophic nail tissue noted and hallux bilaterally. These nails have color changes, and are hypertrophic.  Musculoskeletal: HAV deformities bilaterally        Assessment & Plan:   Assessment: Satisfactory neurovascular status Recurrence of mycotic hallux toenails with symptoms  Plan: Nails x2 debrided without any bleeding. Reappoint when necessary at patient's request. After visit summary provided for diabetic footcare.

## 2013-06-28 ENCOUNTER — Encounter: Payer: Self-pay | Admitting: Podiatry

## 2013-06-29 DIAGNOSIS — H04569 Stenosis of unspecified lacrimal punctum: Secondary | ICD-10-CM | POA: Diagnosis not present

## 2013-06-29 DIAGNOSIS — H04229 Epiphora due to insufficient drainage, unspecified lacrimal gland: Secondary | ICD-10-CM | POA: Diagnosis not present

## 2013-07-09 ENCOUNTER — Ambulatory Visit (INDEPENDENT_AMBULATORY_CARE_PROVIDER_SITE_OTHER): Payer: Medicare Other | Admitting: *Deleted

## 2013-07-09 DIAGNOSIS — E538 Deficiency of other specified B group vitamins: Secondary | ICD-10-CM

## 2013-07-09 MED ORDER — CYANOCOBALAMIN 1000 MCG/ML IJ SOLN
1000.0000 ug | Freq: Once | INTRAMUSCULAR | Status: AC
  Administered 2013-07-09: 1000 ug via INTRAMUSCULAR

## 2013-07-11 ENCOUNTER — Telehealth: Payer: Self-pay | Admitting: Oncology

## 2013-07-11 NOTE — Telephone Encounter (Signed)
, °

## 2013-07-13 ENCOUNTER — Other Ambulatory Visit (HOSPITAL_BASED_OUTPATIENT_CLINIC_OR_DEPARTMENT_OTHER): Payer: Medicare Other

## 2013-07-13 DIAGNOSIS — C50919 Malignant neoplasm of unspecified site of unspecified female breast: Secondary | ICD-10-CM

## 2013-07-13 DIAGNOSIS — Z853 Personal history of malignant neoplasm of breast: Secondary | ICD-10-CM

## 2013-07-13 LAB — COMPREHENSIVE METABOLIC PANEL (CC13)
ALT: 11 U/L (ref 0–55)
ANION GAP: 7 meq/L (ref 3–11)
AST: 16 U/L (ref 5–34)
Albumin: 3.7 g/dL (ref 3.5–5.0)
Alkaline Phosphatase: 50 U/L (ref 40–150)
BUN: 14.2 mg/dL (ref 7.0–26.0)
CO2: 29 meq/L (ref 22–29)
CREATININE: 0.9 mg/dL (ref 0.6–1.1)
Calcium: 9.8 mg/dL (ref 8.4–10.4)
Chloride: 107 mEq/L (ref 98–109)
GLUCOSE: 142 mg/dL — AB (ref 70–140)
Potassium: 4.9 mEq/L (ref 3.5–5.1)
Sodium: 143 mEq/L (ref 136–145)
Total Bilirubin: 0.66 mg/dL (ref 0.20–1.20)
Total Protein: 6.7 g/dL (ref 6.4–8.3)

## 2013-07-13 LAB — CBC WITH DIFFERENTIAL/PLATELET
BASO%: 0.9 % (ref 0.0–2.0)
Basophils Absolute: 0 10*3/uL (ref 0.0–0.1)
EOS%: 3 % (ref 0.0–7.0)
Eosinophils Absolute: 0.2 10*3/uL (ref 0.0–0.5)
HEMATOCRIT: 37.4 % (ref 34.8–46.6)
HGB: 12.6 g/dL (ref 11.6–15.9)
LYMPH%: 34.2 % (ref 14.0–49.7)
MCH: 31.9 pg (ref 25.1–34.0)
MCHC: 33.6 g/dL (ref 31.5–36.0)
MCV: 95.1 fL (ref 79.5–101.0)
MONO#: 0.5 10*3/uL (ref 0.1–0.9)
MONO%: 10.4 % (ref 0.0–14.0)
NEUT#: 2.7 10*3/uL (ref 1.5–6.5)
NEUT%: 51.5 % (ref 38.4–76.8)
PLATELETS: 141 10*3/uL — AB (ref 145–400)
RBC: 3.94 10*6/uL (ref 3.70–5.45)
RDW: 13.5 % (ref 11.2–14.5)
WBC: 5.2 10*3/uL (ref 3.9–10.3)
lymph#: 1.8 10*3/uL (ref 0.9–3.3)

## 2013-07-16 ENCOUNTER — Other Ambulatory Visit: Payer: Medicare Other

## 2013-07-20 ENCOUNTER — Ambulatory Visit (HOSPITAL_BASED_OUTPATIENT_CLINIC_OR_DEPARTMENT_OTHER): Payer: Medicare Other | Admitting: Oncology

## 2013-07-20 VITALS — BP 120/66 | HR 69 | Temp 98.3°F | Resp 18 | Ht 63.0 in | Wt 145.3 lb

## 2013-07-20 DIAGNOSIS — Z853 Personal history of malignant neoplasm of breast: Secondary | ICD-10-CM | POA: Diagnosis not present

## 2013-07-20 NOTE — Progress Notes (Signed)
ID: VEE BAHE   DOB: 04-25-1927  MR#: [id number]  HCW#:[id number]  PCP: Gwendolyn Grant, MD GYN: SU:  OTHER MD:  HISTORY OF PRESENT ILLNESS: Tami Jacobson had a routine screening mammogram at [facility] 06/06/2006.  This showed new microcalcifications in the inner lower quadrant of the left breast.  The patient was referred to the [facility] and on 06/10/2006, she had a stereotactic biopsy, which showed ([id number] and [id number]) a ductal carcinoma in situ, intermediate grade, which was strongly ER and PR positive (both at 100%).    With this information, the patient was referred to Dr. Lennie Hummer and bilateral breast MRIs were obtained 06/23/2006.  This showed only the solitary mass in the left breast consistent with the known malignancy and accordingly Dr. Rosana Hoes proceeded to excisional biopsy 07/04/2006.  The final pathology there ([id number]) showed in addition to the ductal carcinoma in situ a 1.2-cm area of invasive ductal carcinoma, grade 1, with very close, although negative margins.  No evidence of lymphovascular invasion.  The patient did not have a sentinel lymph node because she was not known to have invasive disease. Her subsequent history is as detailed below.  INTERVAL HISTORY:  Tami Jacobson returns today for followup of her breast cancer. She is completing 5 years of antiestrogen therapy this month and is ready to "graduate". She is considering volunteering here  REVIEW OF SYSTEMS: Tami Jacobson is doing fine. She tolerated the tamoxifen with no side effects and she was aware of. Of course she lost her sister a year ago. She has 2 brothers and both of them lost their wives within the last 6 months. She just came back from a sitting one of them. Aside from this, she does not take walks for exercise regularly, but she keeps a small garden, does her housework and cooking, and has normal activity level for someone her age. A detailed review of systems today was otherwise entirely  noncontributory  PAST MEDICAL HISTORY: Past Medical History  Diagnosis Date  . Vitamin B12 deficiency   . Carcinoma in situ of breast     R 06/2006, L 06/2008, L 06/2011 LCIS  . Esophageal stricture   . Diverticulosis   . Hypothyroid   . Hyperlipidemia     intol of all statins  . Anemia   . Gastritis   . Alopecia   . Carotid bruit   . Premature ventricular contraction   . PAC (premature atrial contraction)   . Diabetes mellitus     controlled by diet  . GERD (gastroesophageal reflux disease)   . Arthritis   Significant for hypertension, GERD, history of palpitations, history of osteoarthritis, diverticular disease, history of ocular migraines, heart murmur, status post cholecystectomy, status post appendectomy, status post simple hysterectomy without salpingo-oophorectomy, status post tonsillectomy and adenoidectomy, status post mild to moderate hypercholesterolemia.    PAST SURGICAL HISTORY: Past Surgical History  Procedure Laterality Date  . Abdominal hysterectomy  1979  . Appendectomy  2002  . Cholecystectomy  2002  . Tonsillectomy  1942  . Esophageal dilation  2006  . Breast lumpectomy  2008, 2010    X 2  . Cataract extraction    . Breast biopsy  07/02/2011    Procedure: BREAST BIOPSY WITH NEEDLE LOCALIZATION;  Surgeon: Shann Medal, MD;  Location: [facility];  Service: General;  Laterality: Left;  left breast atypical hyperplasia needle localization biopsy    FAMILY HISTORY Family History  Problem Relation Age of Onset  . Colon cancer Mother   .  Breast cancer Sister     x 3  . Diverticulitis Sister   . Cancer Sister     breast  . Heart disease Maternal Uncle     CABG  . Diverticulitis Sister   . Cancer Sister     leukemia  . Heart disease Brother   . Anesthesia problems Neg Hx   The patient's father died at the age of 74, she believes, from suicide.  The patient's mother died at the age of 37 in the setting of colon cancer, which had been diagnosed when she was  23.  She had a myocardial infarction and apparently a ruptured aneurysm as the cause of death.  The patient is 79 of 5 sisters; 2 sisters died from breast cancer-1 was diagnosed age 59 and died at age 51, 43 was diagnosed age 84 and died at age 50.  A third sister was diagnosed at age 78 and is alive 2 years later with breast cancer, and of course, the patient now has just been diagnosed so there are 4 out of 5 sisters with breast cancer, but none before age 45.  There are also 2 brothers, neither with cancer.  GYNECOLOGIC HISTORY: She is GX P1, first pregnancy age 15.  She took Premarin for 29 years, stopping about 5 years ago.    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  She has been married to Yoe by Tom or Tommy-for 54 years.  He used to work for [employer].  Her daughter, Tami Jacobson, is a Print production planner..  The patient has a 76-year-old grandchild.  She attends [employer] in [place].    ADVANCED DIRECTIVES: In place  HEALTH MAINTENANCE: History  Substance Use Topics  . Smoking status: Never Smoker   . Smokeless tobacco: Never Used  . Alcohol Use: No     Colonoscopy:  PAP:  Bone density:  Lipid panel:  Allergies  Allergen Reactions  . Cephalexin   . Codeine   . Lansoprazole   . Levofloxacin   . Nitrofurantoin   . Rofecoxib   . Statins     Current Outpatient Prescriptions  Medication Sig Dispense Refill  . aspirin EC 81 MG tablet Take 1 tablet (81 mg total) by mouth daily.  90 tablet  3  . clobetasol cream (TEMOVATE) 1.61 % 1 application as needed. Irritated skin      . cyanocobalamin (,VITAMIN B-12,) 1000 MCG/ML injection Inject 1,000 mcg into the muscle every 30 (thirty) days.       . flecainide (TAMBOCOR) 50 MG tablet TAKE ONE TABLET BY MOUTH TWICE DAILY  60 tablet  5  . fluticasone (FLONASE) 50 MCG/ACT nasal spray Place 2 sprays into both nostrils daily.  16 g  2  .  glucose blood (FREESTYLE LITE) test strip Use to check blood sugar daily Dx 250.00  100 each  3  . hydrocortisone (ANUSOL-HC) 25 MG suppository Use 1 suppository at bedtime for 14 days.  14 suppository  2  . hydrocortisone-pramoxine (ANALPRAM-HC) 2.5-1 % rectal cream APPLY RECTALLY THREE TIMES DAILY  30 g  0  . Lancets (FREESTYLE) lancets Use to help check blood sugar daily Dx 250.00  100 each  3  . metFORMIN (GLUCOPHAGE XR) 500 MG 24 hr tablet Take 1 tablet (500 mg total) by mouth daily with breakfast.  90 tablet  3  . metoprolol succinate (TOPROL-XL) 50 MG 24 hr tablet TAKE  ONE TABLET BY MOUTH ONCE DAILY  30 tablet  6  . omeprazole (PRILOSEC) 20 MG capsule Take 1 capsule (20 mg total) by mouth daily.  90 capsule  1  . promethazine-dextromethorphan (PROMETHAZINE-DM) 6.25-15 MG/5ML syrup Take 5 mLs by mouth 4 (four) times daily as needed for cough.  118 mL  0  . tamoxifen (NOLVADEX) 20 MG tablet Take 1 tablet (20 mg total) by mouth daily.  90 tablet  12  . terconazole (TERAZOL 7) 0.4 % vaginal cream        No current facility-administered medications for this visit.    OBJECTIVE: Elderly white woman in no acute distress  Filed Vitals:   07/20/13 1505  BP: 120/66  Pulse: 69  Temp: 98.3 F (36.8 C)  Resp: 18     Body mass index is 25.75 kg/(m^2).    ECOG FS: 0  Sclerae unicteric, pupils round and equal  Oropharynx clear, slightly dry  No  cervical or supraclavicular adenopathy Lungs no rales or rhonchi Heart regular rate and rhythm Abd soft, nontender, positive bowel sounds, no masses palpated MSK no focal spinal tenderness, no peripheral edema Neuro: nonfocal, well oriented, pleasant affect Breasts: There is no evidence of recurrence in either breast. Both axillae are benign.  LAB RESULTS: Lab Results  Component Value Date   WBC 5.2 07/13/2013   NEUTROABS 2.7 07/13/2013   HGB 12.6 07/13/2013   HCT 37.4 07/13/2013   MCV 95.1 07/13/2013   PLT 141* 07/13/2013      Chemistry       Component Value Date/Time   NA 143 07/13/2013 0854   NA 143 06/26/2013 0941   K 4.9 07/13/2013 0854   K 4.6 06/26/2013 0941   CL 105 06/26/2013 0941   CL 106 07/11/2012 0901   CO2 29 07/13/2013 0854   CO2 29 06/26/2013 0941   BUN 14.2 07/13/2013 0854   BUN 14 06/26/2013 0941   CREATININE 0.9 07/13/2013 0854   CREATININE 0.9 06/26/2013 0941      Component Value Date/Time   CALCIUM 9.8 07/13/2013 0854   CALCIUM 9.8 06/26/2013 0941   ALKPHOS 50 07/13/2013 0854   ALKPHOS 80 07/08/2011 1252   AST 16 07/13/2013 0854   AST 19 07/08/2011 1252   ALT 11 07/13/2013 0854   ALT 15 07/08/2011 1252   BILITOT 0.66 07/13/2013 0854   BILITOT 0.8 07/08/2011 1252       Lab Results  Component Value Date   LABCA2 18 10/07/2009    No components found with this basename: [id number]    No results found for this basename: INR,  in the last 168 hours  Urinalysis No results found for this basename: colorurine,  appearanceur,  labspec,  phurine,  glucoseu,  hgbur,  bilirubinur,  ketonesur,  proteinur,  urobilinogen,  nitrite,  leukocytesur    STUDIES: Study Result    CLINICAL DATA: History of left lumpectomy for breast cancer 2008  with subsequent benign left excisional biopsy 2013. Right lumpectomy  for breast cancer 2010.  EXAM:  DIGITAL DIAGNOSTIC bilateral MAMMOGRAM WITH CAD  COMPARISON: Prior exams  ACR Breast Density Category c: The breast tissue is heterogeneously  dense, which may obscure small masses.  FINDINGS:  Bilateral lumpectomy changes are noted. No new suspicious finding is  seen in either breast.  Mammographic images were processed with CAD.  IMPRESSION:  No evidence for malignancy in either breast.  RECOMMENDATION:  Diagnostic mammogram is suggested in 1 year. (Code:DM-B-01Y)  I have discussed the  findings and recommendations with the patient.  Results were also provided in writing at the conclusion of the  visit. If applicable, a reminder letter will be sent to the patient  regarding the  next appointment.  BI-RADS CATEGORY 2: Benign finding(s).  Electronically Signed  By: Conchita Paris M.D.  On: 06/20/2013 11:54     ASSESSMENT: 80 y.o.  [place] woman with history of -  (1) Left lumpectomy in March 2008 for a 1.2 cm, grade 1, invasive ductal carcinoma.  Strongly ER/PR positive, HER-2/neu negative, with borderline proliferation fraction.  Treated intermittently with tamoxifen.  (2) Right lumpectomy March 2010 for a T1cN0, grade 1, invasive ductal carcinoma, ER strongly positive, PR and HER-2/neu negative, with MIB-1 of 14%.   (3) On Arimidex April 2010 to April 2013  (4) s/p excision of an area of lobular carcinoma in situ from the left beast 07/02/2011  (5) started tamoxifen April 2013, stopped April 2015   PLAN: Rhiannon has completed 5 years of antiestrogen including 3 years of an aromatase inhibitor. She understands we do not have evidence that continuing beyond 5 years for this combination is helpful, unhelpful, or makes no difference. Accordingly we are stopping.  She did tolerate atorvastatin is very well and they were very inexpensive for her. They cut the risk of recurrence in half and also reduced in half the risk of her developing a new breast cancer.  At this point I am comfortable releasing her to her primary care physician. She will need a yearly mammogram and a yearly physician breast exam. She knows we will be glad to see her at any point if the need arises, but as of now we are making no further routine appointment for her here.  Virgie Dad Deo Mehringer    07/20/2013

## 2013-07-23 ENCOUNTER — Encounter: Payer: Medicare Other | Admitting: Oncology

## 2013-08-09 ENCOUNTER — Ambulatory Visit (INDEPENDENT_AMBULATORY_CARE_PROVIDER_SITE_OTHER): Payer: Medicare Other

## 2013-08-09 ENCOUNTER — Ambulatory Visit: Payer: Medicare Other

## 2013-08-09 DIAGNOSIS — E538 Deficiency of other specified B group vitamins: Secondary | ICD-10-CM

## 2013-08-09 MED ORDER — CYANOCOBALAMIN 1000 MCG/ML IJ SOLN
1000.0000 ug | Freq: Once | INTRAMUSCULAR | Status: AC
  Administered 2013-08-09: 1000 ug via INTRAMUSCULAR

## 2013-09-10 ENCOUNTER — Ambulatory Visit (INDEPENDENT_AMBULATORY_CARE_PROVIDER_SITE_OTHER): Payer: Medicare Other

## 2013-09-10 DIAGNOSIS — E538 Deficiency of other specified B group vitamins: Secondary | ICD-10-CM | POA: Diagnosis not present

## 2013-09-10 MED ORDER — CYANOCOBALAMIN 1000 MCG/ML IJ SOLN
1000.0000 ug | Freq: Once | INTRAMUSCULAR | Status: AC
  Administered 2013-09-10: 1000 ug via INTRAMUSCULAR

## 2013-09-12 DIAGNOSIS — H5710 Ocular pain, unspecified eye: Secondary | ICD-10-CM | POA: Diagnosis not present

## 2013-09-20 DIAGNOSIS — D485 Neoplasm of uncertain behavior of skin: Secondary | ICD-10-CM | POA: Diagnosis not present

## 2013-09-20 DIAGNOSIS — L570 Actinic keratosis: Secondary | ICD-10-CM | POA: Diagnosis not present

## 2013-09-20 DIAGNOSIS — Z85828 Personal history of other malignant neoplasm of skin: Secondary | ICD-10-CM | POA: Diagnosis not present

## 2013-10-10 ENCOUNTER — Telehealth: Payer: Self-pay | Admitting: Internal Medicine

## 2013-10-10 ENCOUNTER — Ambulatory Visit: Payer: Medicare Other

## 2013-10-10 DIAGNOSIS — E538 Deficiency of other specified B group vitamins: Secondary | ICD-10-CM

## 2013-10-10 MED ORDER — CYANOCOBALAMIN 1000 MCG/ML IJ SOLN
1000.0000 ug | Freq: Once | INTRAMUSCULAR | Status: AC
  Administered 2013-10-10: 1000 ug via INTRAMUSCULAR

## 2013-10-10 MED ORDER — OMEPRAZOLE 20 MG PO CPDR: 20.0000 mg | DELAYED_RELEASE_CAPSULE | Freq: Every day | ORAL | Status: DC

## 2013-10-10 NOTE — Telephone Encounter (Signed)
Rx sent. She needs office visit for further refills. 

## 2013-10-16 ENCOUNTER — Telehealth: Payer: Self-pay | Admitting: Cardiology

## 2013-10-16 ENCOUNTER — Telehealth: Payer: Self-pay | Admitting: Internal Medicine

## 2013-10-16 DIAGNOSIS — I6322 Cerebral infarction due to unspecified occlusion or stenosis of basilar arteries: Secondary | ICD-10-CM

## 2013-10-16 NOTE — Telephone Encounter (Signed)
Pt states she got her meds refilled and she needs an OV scheduled to continue getting medications. Pt scheduled to see Dr. Olevia Perches 10/30/13@9 :30am. Pt aware of appt.

## 2013-10-16 NOTE — Telephone Encounter (Signed)
Is calling because she is supposed to have a cortoid doppler and wanted to know when will she have it .Marland Kitchen Please call   Thanks

## 2013-10-16 NOTE — Telephone Encounter (Signed)
Spoke with pt, carotid dopplers scheduled. 

## 2013-10-23 ENCOUNTER — Ambulatory Visit (HOSPITAL_COMMUNITY)
Admission: RE | Admit: 2013-10-23 | Discharge: 2013-10-23 | Disposition: A | Discharge status: Home / Self Care | Payer: Medicare Other | Source: Ambulatory Visit | Attending: Cardiovascular Disease | Admitting: Cardiovascular Disease

## 2013-10-23 DIAGNOSIS — I6529 Occlusion and stenosis of unspecified carotid artery: Secondary | ICD-10-CM | POA: Diagnosis not present

## 2013-10-23 DIAGNOSIS — I6322 Cerebral infarction due to unspecified occlusion or stenosis of basilar arteries: Secondary | ICD-10-CM | POA: Diagnosis not present

## 2013-10-23 DIAGNOSIS — I6521 Occlusion and stenosis of right carotid artery: Secondary | ICD-10-CM

## 2013-10-23 NOTE — Progress Notes (Signed)
Carotid Duplex Completed. Himani Corona, BS, RDMS, RVT  

## 2013-10-25 ENCOUNTER — Encounter: Payer: Self-pay | Admitting: Cardiology

## 2013-10-25 NOTE — Telephone Encounter (Signed)
This encounter was created in error - please disregard.

## 2013-10-25 NOTE — Telephone Encounter (Signed)
Pt says she have been waiting to hear from you,about the results of her dopplers.

## 2013-10-28 ENCOUNTER — Other Ambulatory Visit: Payer: Self-pay | Admitting: Cardiology

## 2013-10-30 ENCOUNTER — Ambulatory Visit (INDEPENDENT_AMBULATORY_CARE_PROVIDER_SITE_OTHER): Payer: Medicare Other | Admitting: Internal Medicine

## 2013-10-30 ENCOUNTER — Encounter: Payer: Self-pay | Admitting: Internal Medicine

## 2013-10-30 ENCOUNTER — Telehealth: Payer: Self-pay | Admitting: Internal Medicine

## 2013-10-30 VITALS — BP 140/62 | HR 72 | Ht 63.0 in | Wt 143.4 lb

## 2013-10-30 DIAGNOSIS — I6322 Cerebral infarction due to unspecified occlusion or stenosis of basilar arteries: Secondary | ICD-10-CM

## 2013-10-30 DIAGNOSIS — K219 Gastro-esophageal reflux disease without esophagitis: Secondary | ICD-10-CM | POA: Diagnosis not present

## 2013-10-30 DIAGNOSIS — K222 Esophageal obstruction: Secondary | ICD-10-CM

## 2013-10-30 MED ORDER — OMEPRAZOLE 20 MG PO CPDR: 20.0000 mg | DELAYED_RELEASE_CAPSULE | Freq: Every day | ORAL | Status: DC

## 2013-10-30 NOTE — Progress Notes (Signed)
Tami Jacobson Jun 12, 1927 [id number]  Note: This dictation was prepared with Dragon digital system. Any transcriptional errors that result from this procedure are unintentional.   History of Present Illness:  This is an 80 year old white female who is here to refill  Prilosec which she takes for gastroesophageal reflux and esophageal stricture. Her last upper endoscopy in February 2009 showed a mild stricture which was dilated. She currently denies any dysphagia or heartburn. She has a family history of colon cancer in her mother and has undergone numerous colonoscopies starting in 1997 at which time she had a polyp but subsequent colonoscopies in 1999, 2003, 2009 and in August 2011 did not show any polyps. She has severe diverticulosis of the sigmoid colon. She has been on lifelong supplements of B12 because of chronic B12 deficiency. Her last B12 level in February 2015 was 445.    Past Medical History  Diagnosis Date  . Vitamin B12 deficiency   . Carcinoma in situ of breast     R 06/2006, L 06/2008, L 06/2011 LCIS  . Esophageal stricture   . Diverticulosis   . Hypothyroid   . Hyperlipidemia     intol of all statins  . Anemia   . Gastritis   . Alopecia   . Carotid bruit   . Premature ventricular contraction   . PAC (premature atrial contraction)   . Diabetes mellitus     controlled by diet  . GERD (gastroesophageal reflux disease)   . Arthritis     Past Surgical History  Procedure Laterality Date  . Abdominal hysterectomy  1979  . Appendectomy  2002  . Cholecystectomy  2002  . Tonsillectomy  1942  . Esophageal dilation  2006  . Breast lumpectomy  2008, 2010    X 2  . Cataract extraction    . Breast biopsy  07/02/2011    Procedure: BREAST BIOPSY WITH NEEDLE LOCALIZATION;  Surgeon: Shann Medal, MD;  Location: [facility];  Service: General;  Laterality: Left;  left breast atypical hyperplasia needle localization biopsy    Allergies  Allergen Reactions  . Cephalexin   .  Codeine   . Lansoprazole   . Levofloxacin   . Nitrofurantoin   . Rofecoxib   . Statins     Family history and social history have been reviewed.  Review of Systems:   The remainder of the 10 point ROS is negative except as outlined in the H&P  Physical Exam: General Appearance Well developed, in no distress Eyes  Non icteric  HEENT  Non traumatic, normocephalic  Mouth No lesion, tongue papillated, no cheilosis Neck Supple without adenopathy, thyroid not enlarged, no carotid bruits, no JVD Lungs Clear to auscultation bilaterally COR Normal S1, normal S2, regular rhythm, no murmur, quiet precordium Abdomen soft nontender with normoactive bowel sounds. No fullness. Liver edge at costal margin Rectal not done Extremities  No pedal edema Skin No lesions Neurological Alert and oriented x 3 Psychological Normal mood and affect  Assessment and Plan:   Problem #91 80 year old white female with history of esophageal stricture and gastroesophageal reflux comes for refills of Prilosec. We will refill a 90 day supply through [facility]. We will also refill B12.  Problem #2 Colorectal screening. There is no recall planned due to age.    Tami Jacobson 10/30/2013

## 2013-10-30 NOTE — Patient Instructions (Addendum)
We have sent the following prescriptions to your mail in pharmacy: omeprazole  If you have not heard from your mail in pharmacy within 1 week or if you have not received your medication in the mail, please contact us at [phone] so we may find out why.   CC: Dr Asa Lente, Dr Jana Hakim

## 2013-10-30 NOTE — Telephone Encounter (Signed)
Dr. Olevia Perches do you want this pt to have B12 labs drawn? She states she saw you today and discussed this. Please advise.

## 2013-10-30 NOTE — Telephone Encounter (Signed)
We talked about it but I told her she did not need B12 level today since she had one in Feb.2015, next check Feb 2016.

## 2013-10-31 NOTE — Telephone Encounter (Signed)
Patient notified of recommendation. 

## 2013-11-12 ENCOUNTER — Ambulatory Visit (INDEPENDENT_AMBULATORY_CARE_PROVIDER_SITE_OTHER): Payer: Medicare Other

## 2013-11-12 DIAGNOSIS — E538 Deficiency of other specified B group vitamins: Secondary | ICD-10-CM

## 2013-11-12 MED ORDER — CYANOCOBALAMIN 1000 MCG/ML IJ SOLN
1000.0000 ug | Freq: Once | INTRAMUSCULAR | Status: AC
  Administered 2013-11-12: 1000 ug via INTRAMUSCULAR

## 2013-12-05 ENCOUNTER — Encounter: Payer: Self-pay | Admitting: Podiatry

## 2013-12-05 ENCOUNTER — Ambulatory Visit (INDEPENDENT_AMBULATORY_CARE_PROVIDER_SITE_OTHER): Payer: Medicare Other | Admitting: Podiatry

## 2013-12-05 VITALS — BP 150/67 | HR 65 | Resp 14 | Ht 63.0 in | Wt 140.0 lb

## 2013-12-05 DIAGNOSIS — M79676 Pain in unspecified toe(s): Secondary | ICD-10-CM

## 2013-12-05 DIAGNOSIS — M79609 Pain in unspecified limb: Secondary | ICD-10-CM

## 2013-12-05 DIAGNOSIS — B351 Tinea unguium: Secondary | ICD-10-CM | POA: Diagnosis not present

## 2013-12-05 NOTE — Progress Notes (Signed)
   Subjective:    Patient ID: Tami Jacobson, female    DOB: 12/30/1927, 80 y.o.   MRN: [id number]  HPI Comments: Pt request removal of B/L 1st toenails again, due tenderness in certain shoes.  Space presents complaining of painful hallux toenails which had been removed previously for permanent correction the   Review of Systems  All other systems reviewed and are negative.      Objective:   Physical Exam  Orientated x 69-year-old white female  The hallux toenails are brittle, hypertrophic , discolored with partial regrowth        Assessment & Plan:   Assessment: Symptomatic onychomycoses hallux nails bilaterally  Plan: Debrided hallux toenails without a bleeding  Reappoint at patient's request

## 2013-12-13 ENCOUNTER — Ambulatory Visit (INDEPENDENT_AMBULATORY_CARE_PROVIDER_SITE_OTHER): Payer: Medicare Other | Admitting: *Deleted

## 2013-12-13 DIAGNOSIS — E538 Deficiency of other specified B group vitamins: Secondary | ICD-10-CM | POA: Diagnosis not present

## 2013-12-13 MED ORDER — CYANOCOBALAMIN 1000 MCG/ML IJ SOLN
1000.0000 ug | Freq: Once | INTRAMUSCULAR | Status: AC
  Administered 2013-12-13: 1000 ug via INTRAMUSCULAR

## 2013-12-31 DIAGNOSIS — L821 Other seborrheic keratosis: Secondary | ICD-10-CM | POA: Diagnosis not present

## 2013-12-31 DIAGNOSIS — L408 Other psoriasis: Secondary | ICD-10-CM | POA: Diagnosis not present

## 2013-12-31 DIAGNOSIS — Z85828 Personal history of other malignant neoplasm of skin: Secondary | ICD-10-CM | POA: Diagnosis not present

## 2013-12-31 DIAGNOSIS — L570 Actinic keratosis: Secondary | ICD-10-CM | POA: Diagnosis not present

## 2013-12-31 DIAGNOSIS — L80 Vitiligo: Secondary | ICD-10-CM | POA: Diagnosis not present

## 2014-01-01 ENCOUNTER — Other Ambulatory Visit (INDEPENDENT_AMBULATORY_CARE_PROVIDER_SITE_OTHER): Payer: Medicare Other

## 2014-01-01 ENCOUNTER — Ambulatory Visit (INDEPENDENT_AMBULATORY_CARE_PROVIDER_SITE_OTHER): Payer: Medicare Other | Admitting: Internal Medicine

## 2014-01-01 ENCOUNTER — Encounter: Payer: Self-pay | Admitting: Internal Medicine

## 2014-01-01 VITALS — BP 130/70 | HR 66 | Temp 98.3°F | Ht 63.0 in | Wt 144.5 lb

## 2014-01-01 DIAGNOSIS — E782 Mixed hyperlipidemia: Secondary | ICD-10-CM

## 2014-01-01 DIAGNOSIS — E119 Type 2 diabetes mellitus without complications: Secondary | ICD-10-CM

## 2014-01-01 DIAGNOSIS — H5710 Ocular pain, unspecified eye: Secondary | ICD-10-CM | POA: Diagnosis not present

## 2014-01-01 DIAGNOSIS — E538 Deficiency of other specified B group vitamins: Secondary | ICD-10-CM | POA: Diagnosis not present

## 2014-01-01 DIAGNOSIS — I6322 Cerebral infarction due to unspecified occlusion or stenosis of basilar arteries: Secondary | ICD-10-CM

## 2014-01-01 DIAGNOSIS — Z23 Encounter for immunization: Secondary | ICD-10-CM | POA: Diagnosis not present

## 2014-01-01 LAB — HEMOGLOBIN A1C: Hgb A1c MFr Bld: 6.1 % (ref 4.6–6.5)

## 2014-01-01 NOTE — Assessment & Plan Note (Signed)
intol of all statins in past ICAS on [place] 05/2013 - monitor carotids as recommended  encouraged improvement in lifestyle control efforts with diet and exercise

## 2014-01-01 NOTE — Progress Notes (Signed)
Subjective:    Patient ID: Tami Jacobson, female    DOB: 02/03/1928, 80 y.o.   MRN: [id number]  HPI  Patient is here for follow up  Reviewed chronic medical issues and interval medical events  Past Medical History  Diagnosis Date  . Vitamin B12 deficiency   . Carcinoma in situ of breast     R 06/2006, L 06/2008, L 06/2011 LCIS  . Esophageal stricture   . Diverticulosis   . Hypothyroid   . Hyperlipidemia     intol of all statins  . Anemia   . Gastritis   . Alopecia   . Carotid bruit   . Premature ventricular contraction   . PAC (premature atrial contraction)   . Diabetes mellitus     controlled by diet  . GERD (gastroesophageal reflux disease)   . Arthritis     Review of Systems  Constitutional: Negative for fever and fatigue.  Respiratory: Negative for cough and shortness of breath.   Musculoskeletal: Positive for arthralgias. Negative for gait problem and joint swelling.  Skin: Negative for color change.  Neurological: Positive for numbness (B feet "asleep" intermittently).       Objective:   Physical Exam  BP 130/70  Pulse 66  Temp(Src) 98.3 F (36.8 C) (Oral)  Ht 5\' 3"  (1.6 m)  Wt 144 lb 8 oz (65.545 kg)  BMI 25.60 kg/m2  SpO2 94% Wt Readings from Last 3 Encounters:  01/01/14 144 lb 8 oz (65.545 kg)  12/05/13 140 lb (63.504 kg)  10/30/13 143 lb 6 oz (65.034 kg)   Constitutional: She appears well-developed and well-nourished. No distress.  Neck: B carotid bruits without change. Normal range of motion. Neck supple. No JVD present. No thyromegaly present.  Cardiovascular: Normal rate, regular rhythm and normal heart sounds.  No murmur heard. No BLE edema. Pulmonary/Chest: Effort normal and breath sounds normal. No respiratory distress. She has no wheezes.  MSkel: Back: full range of motion of thoracic and lumbar spine. Non tender to palpation. Negative straight leg raise. DTR's are symmetrically intact. Sensation intact in all dermatomes of the lower  extremities. Full strength to manual muscle testing. patient is able to heel toe walk without difficulty and ambulates with normal gait. Psychiatric: She has a normal mood and affect. Her behavior is normal. Judgment and thought content normal.   Lab Results  Component Value Date   WBC 5.2 07/13/2013   HGB 12.6 07/13/2013   HCT 37.4 07/13/2013   PLT 141* 07/13/2013   GLUCOSE 142* 07/13/2013   CHOL 205* 06/26/2013   TRIG 133.0 06/26/2013   HDL 54.70 06/26/2013   LDLDIRECT 128.3 12/26/2012   LDLCALC 124* 06/26/2013   ALT 11 07/13/2013   AST 16 07/13/2013   NA 143 07/13/2013   K 4.9 07/13/2013   CL 105 06/26/2013   CREATININE 0.9 07/13/2013   BUN 14.2 07/13/2013   CO2 29 07/13/2013   TSH 1.79 06/26/2013   HGBA1C 6.1 06/26/2013   MICROALBUR 0.5 06/26/2013    No results found.     Assessment & Plan:   Problem List Items Addressed This Visit   B12 DEFICIENCY      Poor oral absorption for years - on IM replacment Lab Results  Component Value Date   [id number] 829 05/28/2013       DIABETES MELLITUS, TYPE II - Primary      Diet controlled until 12/2011- then started metformin check a1c and monitor same, tx as ongoing Intol of statins Lab  Results  Component Value Date   HGBA1C 6.1 06/26/2013      HYPERLIPIDEMIA     intol of all statins in past ICAS on [place] 05/2013 - monitor carotids as recommended  encouraged improvement in lifestyle control efforts with diet and exercise

## 2014-01-01 NOTE — Assessment & Plan Note (Signed)
Diet controlled until 12/2011- then started metformin check a1c and monitor same, tx as ongoing Intol of statins Lab Results  Component Value Date   HGBA1C 6.1 06/26/2013

## 2014-01-01 NOTE — Progress Notes (Signed)
Pre visit review using our clinic review tool, if applicable. No additional management support is needed unless otherwise documented below in the visit note. 

## 2014-01-01 NOTE — Patient Instructions (Addendum)
It was good to see you today.  We have reviewed your prior records including labs and tests today  Your annual flu shot was given and/or updated today.  Test(s) ordered today. Your results will be released to [place] (or called to you) after review, usually within 72hours after test completion. If any changes need to be made, you will be notified at that same time.  Medications reviewed and updated, no changes recommended at this time. I have removed simvastatin from your list at this time due to side effects   Continue working with your other specialists as reviewed today  Please schedule followup in 6 months for diabetes mellitus check, call sooner if problems.  Diabetes and Standards of Medical Care Diabetes is complicated. You may find that your diabetes team includes a dietitian, nurse, diabetes educator, eye doctor, and more. To help everyone know what is going on and to help you get the care you deserve, the following schedule of care was developed to help keep you on track. Below are the tests, exams, vaccines, medicines, education, and plans you will need. HbA1c test This test shows how well you have controlled your glucose over the past 2-3 months. It is used to see if your diabetes management plan needs to be adjusted.   It is performed at least 2 times a year if you are meeting treatment goals.  It is performed 4 times a year if therapy has changed or if you are not meeting treatment goals. Blood pressure test  This test is performed at every routine medical visit. The goal is less than 140/90 mm Hg for most people, but 130/80 mm Hg in some cases. Ask your health care provider about your goal. Dental exam  Follow up with the dentist regularly. Eye exam  If you are diagnosed with type 1 diabetes as a child, get an exam upon reaching the age of 71 years or older and have had diabetes for 3-5 years. Yearly eye exams are recommended after that initial eye exam.  If you are  diagnosed with type 1 diabetes as an adult, get an exam within 5 years of diagnosis and then yearly.  If you are diagnosed with type 2 diabetes, get an exam as soon as possible after the diagnosis and then yearly. Foot care exam  Visual foot exams are performed at every routine medical visit. The exams check for cuts, injuries, or other problems with the feet.  A comprehensive foot exam should be done yearly. This includes visual inspection as well as assessing foot pulses and testing for loss of sensation.  Check your feet nightly for cuts, injuries, or other problems with your feet. Tell your health care provider if anything is not healing. Kidney function test (urine microalbumin)  This test is performed once a year.  Type 1 diabetes: The first test is performed 5 years after diagnosis.  Type 2 diabetes: The first test is performed at the time of diagnosis.  A serum creatinine and estimated glomerular filtration rate (eGFR) test is done once a year to assess the level of chronic kidney disease (CKD), if present. Lipid profile (cholesterol, HDL, LDL, triglycerides)  Performed every 5 years for most people.  The goal for LDL is less than 100 mg/dL. If you are at high risk, the goal is less than 70 mg/dL.  The goal for HDL is 40 mg/dL-50 mg/dL for men and 50 mg/dL-60 mg/dL for women. An HDL cholesterol of 60 mg/dL or higher gives some protection  against heart disease.  The goal for triglycerides is less than 150 mg/dL. Influenza vaccine, pneumococcal vaccine, and hepatitis B vaccine  The influenza vaccine is recommended yearly.  It is recommended that people with diabetes who are over 82 years old get the pneumonia vaccine. In some cases, two separate shots may be given. Ask your health care provider if your pneumonia vaccination is up to date.  The hepatitis B vaccine is also recommended for adults with diabetes. Diabetes self-management education  Education is recommended at  diagnosis and ongoing as needed. Treatment plan  Your treatment plan is reviewed at every medical visit. Document Released: 01/17/2009 Document Revised: 08/06/2013 Document Reviewed: 08/22/2012 [facility] Patient Information 2015 [place], [place]. This information is not intended to replace advice given to you by your health care provider. Make sure you discuss any questions you have with your health care provider.

## 2014-01-01 NOTE — Assessment & Plan Note (Signed)
Poor oral absorption for years - on IM replacment Lab Results  Component Value Date   VITAMINB12 445 05/28/2013

## 2014-01-02 ENCOUNTER — Telehealth: Payer: Self-pay | Admitting: *Deleted

## 2014-01-02 MED ORDER — METFORMIN HCL ER 500 MG PO TB24: 500.0000 mg | ORAL_TABLET | Freq: Every day | ORAL | Status: DC

## 2014-01-02 NOTE — Telephone Encounter (Signed)
Notified pt with A1c. Pt stated she is also needing refill on her metformin. Inform pt will send...Tami Jacobson

## 2014-01-02 NOTE — Telephone Encounter (Signed)
Left msg on triage stating someone call nad left msg on vm stating labs are ok. Pt is wanting to know the value of her A1c. Called pt back line has been busy x's 2 calls...Johny Chess

## 2014-01-14 ENCOUNTER — Ambulatory Visit (INDEPENDENT_AMBULATORY_CARE_PROVIDER_SITE_OTHER): Payer: Medicare Other

## 2014-01-14 DIAGNOSIS — E538 Deficiency of other specified B group vitamins: Secondary | ICD-10-CM

## 2014-01-14 MED ORDER — CYANOCOBALAMIN 1000 MCG/ML IJ SOLN
1000.0000 ug | Freq: Once | INTRAMUSCULAR | Status: AC
  Administered 2014-01-14: 1000 ug via INTRAMUSCULAR

## 2014-02-14 ENCOUNTER — Ambulatory Visit (INDEPENDENT_AMBULATORY_CARE_PROVIDER_SITE_OTHER): Payer: Medicare Other | Admitting: *Deleted

## 2014-02-14 DIAGNOSIS — E538 Deficiency of other specified B group vitamins: Secondary | ICD-10-CM | POA: Diagnosis not present

## 2014-02-14 MED ORDER — CYANOCOBALAMIN 1000 MCG/ML IJ SOLN
1000.0000 ug | Freq: Once | INTRAMUSCULAR | Status: AC
  Administered 2014-02-14: 1000 ug via INTRAMUSCULAR

## 2014-02-19 DIAGNOSIS — H01005 Unspecified blepharitis left lower eyelid: Secondary | ICD-10-CM | POA: Diagnosis not present

## 2014-02-19 DIAGNOSIS — H01001 Unspecified blepharitis right upper eyelid: Secondary | ICD-10-CM | POA: Diagnosis not present

## 2014-02-19 DIAGNOSIS — H01004 Unspecified blepharitis left upper eyelid: Secondary | ICD-10-CM | POA: Diagnosis not present

## 2014-02-19 DIAGNOSIS — H01002 Unspecified blepharitis right lower eyelid: Secondary | ICD-10-CM | POA: Diagnosis not present

## 2014-03-11 ENCOUNTER — Other Ambulatory Visit: Payer: Self-pay | Admitting: Internal Medicine

## 2014-03-18 ENCOUNTER — Ambulatory Visit (INDEPENDENT_AMBULATORY_CARE_PROVIDER_SITE_OTHER): Payer: Medicare Other | Admitting: *Deleted

## 2014-03-18 DIAGNOSIS — E538 Deficiency of other specified B group vitamins: Secondary | ICD-10-CM | POA: Diagnosis not present

## 2014-03-18 LAB — HM DIABETES EYE EXAM

## 2014-03-18 MED ORDER — CYANOCOBALAMIN 1000 MCG/ML IJ SOLN
1000.0000 ug | Freq: Once | INTRAMUSCULAR | Status: AC
  Administered 2014-03-18: 1000 ug via INTRAMUSCULAR

## 2014-04-12 DIAGNOSIS — H52203 Unspecified astigmatism, bilateral: Secondary | ICD-10-CM | POA: Diagnosis not present

## 2014-04-12 DIAGNOSIS — H5000 Unspecified esotropia: Secondary | ICD-10-CM | POA: Diagnosis not present

## 2014-04-12 DIAGNOSIS — Z961 Presence of intraocular lens: Secondary | ICD-10-CM | POA: Diagnosis not present

## 2014-04-12 DIAGNOSIS — H04223 Epiphora due to insufficient drainage, bilateral lacrimal glands: Secondary | ICD-10-CM | POA: Diagnosis not present

## 2014-04-17 NOTE — Progress Notes (Signed)
HPI: FU palpitations; history of PACs and PVCs with normal LV function. She had significant palpitations in the past despite beta-blockade. She is therefore on flecainide 50 mg p.o. b.i.d. A previous catheterization in June 2000 showed normal coronary arteries and normal LV function. Last Myoview in October of 2004 showed an ejection fraction of 70% with apical thinning and probable normal perfusion. Last echocardiogram in December of 2008 showed normal LV function with an ejection fraction of 70-75%, trivial mitral regurgitation and tricuspid regurgitation. Carotid Dopplers in July of 2015 showed 50-69% bilateral stenosis; fu recommended in one year. Since I last saw her, the patient has dyspnea with more extreme activities but not with routine activities. It is relieved with rest. It is not associated with chest pain. There is no orthopnea, PND or pedal edema. There is no syncope or palpitations. There is no exertional chest pain.   Current Outpatient Prescriptions  Medication Sig Dispense Refill  . aspirin EC 81 MG tablet Take 1 tablet (81 mg total) by mouth daily. 90 tablet 3  . clobetasol cream (TEMOVATE) 8.18 % 1 application as needed. Irritated skin    . cyanocobalamin (,VITAMIN B-12,) 1000 MCG/ML injection Inject 1,000 mcg into the muscle every 30 (thirty) days.     . flecainide (TAMBOCOR) 50 MG tablet TAKE ONE TABLET BY MOUTH TWICE DAILY 60 tablet 5  . fluticasone (FLONASE) 50 MCG/ACT nasal spray Place 2 sprays into both nostrils daily. 16 g 2  . glucose blood (FREESTYLE LITE) test strip Use to check blood sugar daily Dx 250.00 100 each 3  . hydrocortisone (ANUSOL-HC) 25 MG suppository Use 1 suppository at bedtime for 14 days. 14 suppository 2  . hydrocortisone-pramoxine (ANALPRAM-HC) 2.5-1 % rectal cream APPLY RECTALLY THREE TIMES DAILY 30 g 0  . Lancets (FREESTYLE) lancets Use to help check blood sugar daily Dx 250.00 100 each 3  . metFORMIN (GLUCOPHAGE XR) 500 MG 24 hr tablet  Take 1 tablet (500 mg total) by mouth daily with breakfast. 90 tablet 3  . metoprolol succinate (TOPROL-XL) 50 MG 24 hr tablet TAKE ONE TABLET BY MOUTH ONCE DAILY 30 tablet 5  . omeprazole (PRILOSEC) 20 MG capsule Take 1 capsule (20 mg total) by mouth daily. 90 capsule 3  . promethazine-dextromethorphan (PROMETHAZINE-DM) 6.25-15 MG/5ML syrup Take 5 mLs by mouth 4 (four) times daily as needed for cough. 118 mL 0  . terconazole (TERAZOL 7) 0.4 % vaginal cream      No current facility-administered medications for this visit.     Past Medical History  Diagnosis Date  . Vitamin B12 deficiency   . Carcinoma in situ of breast     R 06/2006, L 06/2008, L 06/2011 LCIS  . Esophageal stricture   . Diverticulosis   . Hypothyroid   . Hyperlipidemia     intol of all statins  . Anemia   . Gastritis   . Alopecia   . Carotid bruit   . Premature ventricular contraction   . PAC (premature atrial contraction)   . Diabetes mellitus     controlled by diet  . GERD (gastroesophageal reflux disease)   . Arthritis     Past Surgical History  Procedure Laterality Date  . Abdominal hysterectomy  1979  . Appendectomy  2002  . Cholecystectomy  2002  . Tonsillectomy  1942  . Esophageal dilation  2006  . Breast lumpectomy  2008, 2010    X 2  . Cataract extraction    . Breast  biopsy  07/02/2011    Procedure: BREAST BIOPSY WITH NEEDLE LOCALIZATION;  Surgeon: Shann Medal, MD;  Location: [facility];  Service: General;  Laterality: Left;  left breast atypical hyperplasia needle localization biopsy    History   Social History  . Marital Status: Married    Spouse Name: N/A    Number of Children: N/A  . Years of Education: N/A   Occupational History  . Not on file.   Social History Main Topics  . Smoking status: Never Smoker   . Smokeless tobacco: Never Used  . Alcohol Use: No  . Drug Use: No  . Sexual Activity: Not on file   Other Topics Concern  . Not on file   Social History Narrative    Retired Network engineer   Married, lives with spouse    ROS: no fevers or chills, productive cough, hemoptysis, dysphasia, odynophagia, melena, hematochezia, dysuria, hematuria, rash, seizure activity, orthopnea, PND, pedal edema, claudication. Remaining systems are negative.  Physical Exam: Well-developed well-nourished in no acute distress.  Skin is warm and dry.  HEENT is normal.  Neck is supple.  Chest is clear to auscultation with normal expansion.  Cardiovascular exam is regular rate and rhythm. 2/6 systolic murmur left sternal border. Abdominal exam nontender or distended. No masses palpated. Extremities show no edema. neuro grossly intact  ECG sinus rhythm at a rate of 66. RV conduction delay. Inferior infarct.

## 2014-04-18 ENCOUNTER — Ambulatory Visit (INDEPENDENT_AMBULATORY_CARE_PROVIDER_SITE_OTHER): Payer: Medicare Other | Admitting: Cardiology

## 2014-04-18 ENCOUNTER — Encounter: Payer: Self-pay | Admitting: *Deleted

## 2014-04-18 ENCOUNTER — Encounter: Payer: Self-pay | Admitting: Cardiology

## 2014-04-18 VITALS — BP 140/63 | HR 66 | Ht 63.0 in | Wt 143.3 lb

## 2014-04-18 DIAGNOSIS — I6523 Occlusion and stenosis of bilateral carotid arteries: Secondary | ICD-10-CM | POA: Diagnosis not present

## 2014-04-18 DIAGNOSIS — R011 Cardiac murmur, unspecified: Secondary | ICD-10-CM | POA: Diagnosis not present

## 2014-04-18 DIAGNOSIS — I10 Essential (primary) hypertension: Secondary | ICD-10-CM

## 2014-04-18 NOTE — Assessment & Plan Note (Signed)
Continue aspirin. Intolerant to statins. Follow-up carotid Dopplers July 2016.

## 2014-04-18 NOTE — Assessment & Plan Note (Signed)
Blood pressure controlled. Continue present medications. 

## 2014-04-18 NOTE — Assessment & Plan Note (Signed)
Check echocardiogram 

## 2014-04-18 NOTE — Patient Instructions (Signed)

## 2014-04-18 NOTE — Assessment & Plan Note (Signed)
Continue flecainide and beta blocker. Symptoms reasonably well controlled.

## 2014-04-22 ENCOUNTER — Ambulatory Visit (INDEPENDENT_AMBULATORY_CARE_PROVIDER_SITE_OTHER): Payer: Medicare Other

## 2014-04-22 DIAGNOSIS — E538 Deficiency of other specified B group vitamins: Secondary | ICD-10-CM

## 2014-04-22 MED ORDER — CYANOCOBALAMIN 1000 MCG/ML IJ SOLN
1000.0000 ug | Freq: Once | INTRAMUSCULAR | Status: AC
  Administered 2014-04-22: 1000 ug via INTRAMUSCULAR

## 2014-04-25 ENCOUNTER — Ambulatory Visit (HOSPITAL_COMMUNITY)
Admission: RE | Admit: 2014-04-25 | Discharge: 2014-04-25 | Disposition: A | Discharge status: Home / Self Care | Payer: Medicare Other | Source: Ambulatory Visit | Attending: Cardiology | Admitting: Cardiology

## 2014-04-25 DIAGNOSIS — R011 Cardiac murmur, unspecified: Secondary | ICD-10-CM | POA: Insufficient documentation

## 2014-04-25 DIAGNOSIS — E119 Type 2 diabetes mellitus without complications: Secondary | ICD-10-CM | POA: Diagnosis not present

## 2014-04-25 DIAGNOSIS — E785 Hyperlipidemia, unspecified: Secondary | ICD-10-CM | POA: Insufficient documentation

## 2014-04-25 NOTE — Progress Notes (Signed)
2D Echocardiogram Complete.  04/25/2014   Johnae Friley, RDCS 

## 2014-05-02 ENCOUNTER — Other Ambulatory Visit: Payer: Self-pay | Admitting: *Deleted

## 2014-05-02 MED ORDER — METOPROLOL SUCCINATE ER 50 MG PO TB24: 50.0000 mg | ORAL_TABLET | Freq: Every day | ORAL | Status: DC

## 2014-05-02 MED ORDER — FLECAINIDE ACETATE 50 MG PO TABS: 50.0000 mg | ORAL_TABLET | Freq: Two times a day (BID) | ORAL | Status: DC

## 2014-05-14 ENCOUNTER — Telehealth: Payer: Self-pay | Admitting: Internal Medicine

## 2014-05-14 ENCOUNTER — Other Ambulatory Visit: Payer: Self-pay | Admitting: *Deleted

## 2014-05-14 DIAGNOSIS — E538 Deficiency of other specified B group vitamins: Secondary | ICD-10-CM

## 2014-05-14 NOTE — Telephone Encounter (Signed)
Patient is due this month per phone note. Order in [place]. Line busy will try later.

## 2014-05-14 NOTE — Telephone Encounter (Signed)
Spoke with patient and gave her recommendation.

## 2014-05-16 ENCOUNTER — Other Ambulatory Visit: Payer: Self-pay | Admitting: Oncology

## 2014-05-16 DIAGNOSIS — Z853 Personal history of malignant neoplasm of breast: Secondary | ICD-10-CM

## 2014-05-17 ENCOUNTER — Other Ambulatory Visit (INDEPENDENT_AMBULATORY_CARE_PROVIDER_SITE_OTHER): Payer: Medicare Other

## 2014-05-17 DIAGNOSIS — E538 Deficiency of other specified B group vitamins: Secondary | ICD-10-CM | POA: Diagnosis not present

## 2014-05-17 LAB — VITAMIN B12: VITAMIN B 12: 391 pg/mL (ref 211–911)

## 2014-05-20 ENCOUNTER — Other Ambulatory Visit: Payer: Self-pay | Admitting: *Deleted

## 2014-05-20 DIAGNOSIS — E538 Deficiency of other specified B group vitamins: Secondary | ICD-10-CM

## 2014-05-27 ENCOUNTER — Ambulatory Visit (INDEPENDENT_AMBULATORY_CARE_PROVIDER_SITE_OTHER): Payer: Medicare Other | Admitting: *Deleted

## 2014-05-27 DIAGNOSIS — E538 Deficiency of other specified B group vitamins: Secondary | ICD-10-CM | POA: Diagnosis not present

## 2014-05-27 MED ORDER — CYANOCOBALAMIN 1000 MCG/ML IJ SOLN
1000.0000 ug | Freq: Once | INTRAMUSCULAR | Status: AC
  Administered 2014-05-27: 1000 ug via INTRAMUSCULAR

## 2014-06-24 ENCOUNTER — Ambulatory Visit
Admission: RE | Admit: 2014-06-24 | Discharge: 2014-06-24 | Disposition: A | Discharge status: Home / Self Care | Payer: Medicare Other | Source: Ambulatory Visit | Attending: Oncology | Admitting: Oncology

## 2014-06-24 ENCOUNTER — Telehealth: Payer: Self-pay | Admitting: *Deleted

## 2014-06-24 DIAGNOSIS — Z853 Personal history of malignant neoplasm of breast: Secondary | ICD-10-CM

## 2014-06-24 NOTE — Telephone Encounter (Signed)
INFORMED PT. THAT DR.MAGRINAT HAS RELEASED HER FROM THIS PRACTICE TO BE FOLLOWED BY HER PRIMARY CARE PHYSICIAN. SHE VOICES UNDERSTANDING.

## 2014-06-25 ENCOUNTER — Ambulatory Visit (INDEPENDENT_AMBULATORY_CARE_PROVIDER_SITE_OTHER): Payer: Medicare Other | Admitting: *Deleted

## 2014-06-25 DIAGNOSIS — E538 Deficiency of other specified B group vitamins: Secondary | ICD-10-CM

## 2014-06-25 MED ORDER — CYANOCOBALAMIN 1000 MCG/ML IJ SOLN
1000.0000 ug | Freq: Once | INTRAMUSCULAR | Status: AC
  Administered 2014-06-25: 1000 ug via INTRAMUSCULAR

## 2014-06-27 DIAGNOSIS — Z01419 Encounter for gynecological examination (general) (routine) without abnormal findings: Secondary | ICD-10-CM | POA: Diagnosis not present

## 2014-06-27 DIAGNOSIS — Z130 Encounter for screening for diseases of the blood and blood-forming organs and certain disorders involving the immune mechanism: Secondary | ICD-10-CM | POA: Diagnosis not present

## 2014-06-27 DIAGNOSIS — Z1389 Encounter for screening for other disorder: Secondary | ICD-10-CM | POA: Diagnosis not present

## 2014-06-27 DIAGNOSIS — L900 Lichen sclerosus et atrophicus: Secondary | ICD-10-CM | POA: Diagnosis not present

## 2014-07-02 ENCOUNTER — Ambulatory Visit (INDEPENDENT_AMBULATORY_CARE_PROVIDER_SITE_OTHER): Payer: Medicare Other | Admitting: Internal Medicine

## 2014-07-02 ENCOUNTER — Encounter: Payer: Self-pay | Admitting: Internal Medicine

## 2014-07-02 ENCOUNTER — Telehealth: Payer: Self-pay | Admitting: Internal Medicine

## 2014-07-02 ENCOUNTER — Other Ambulatory Visit (INDEPENDENT_AMBULATORY_CARE_PROVIDER_SITE_OTHER): Payer: Medicare Other

## 2014-07-02 VITALS — BP 140/70 | HR 60 | Temp 97.8°F | Resp 16 | Wt 143.0 lb

## 2014-07-02 DIAGNOSIS — E782 Mixed hyperlipidemia: Secondary | ICD-10-CM | POA: Diagnosis not present

## 2014-07-02 DIAGNOSIS — I6523 Occlusion and stenosis of bilateral carotid arteries: Secondary | ICD-10-CM

## 2014-07-02 DIAGNOSIS — I10 Essential (primary) hypertension: Secondary | ICD-10-CM

## 2014-07-02 DIAGNOSIS — E119 Type 2 diabetes mellitus without complications: Secondary | ICD-10-CM

## 2014-07-02 DIAGNOSIS — Z23 Encounter for immunization: Secondary | ICD-10-CM | POA: Diagnosis not present

## 2014-07-02 DIAGNOSIS — Z0000 Encounter for general adult medical examination without abnormal findings: Secondary | ICD-10-CM | POA: Diagnosis not present

## 2014-07-02 DIAGNOSIS — E538 Deficiency of other specified B group vitamins: Secondary | ICD-10-CM

## 2014-07-02 LAB — BASIC METABOLIC PANEL
BUN: 15 mg/dL (ref 6–23)
CALCIUM: 10.3 mg/dL (ref 8.4–10.5)
CO2: 25 meq/L (ref 19–32)
Chloride: 101 mEq/L (ref 96–112)
Creatinine, Ser: 0.85 mg/dL (ref 0.40–1.20)
GFR: 67.61 mL/min (ref >60.00)
Glucose, Bld: 130 mg/dL — ABNORMAL HIGH (ref 70–99)
POTASSIUM: 4 meq/L (ref 3.5–5.1)
Sodium: 138 mEq/L (ref 135–145)

## 2014-07-02 LAB — LIPID PANEL
CHOLESTEROL: 212 mg/dL — AB (ref 0–200)
HDL: 53.2 mg/dL (ref >39.00)
LDL Cholesterol: 136 mg/dL — ABNORMAL HIGH (ref 0–99)
NonHDL: 158.8
Total CHOL/HDL Ratio: 4
Triglycerides: 114 mg/dL (ref 0.0–149.0)
VLDL: 22.8 mg/dL (ref 0.0–40.0)

## 2014-07-02 LAB — MICROALBUMIN / CREATININE URINE RATIO
Creatinine,U: 93.4 mg/dL
Microalb Creat Ratio: 0.7 mg/g (ref 0.0–30.0)
Microalb, Ur: 0.7 mg/dL (ref 0.0–1.9)

## 2014-07-02 LAB — HEMOGLOBIN A1C: Hgb A1c MFr Bld: 6.1 % (ref 4.6–6.5)

## 2014-07-02 LAB — VITAMIN B12: Vitamin B-12: 899 pg/mL (ref 211–911)

## 2014-07-02 MED ORDER — FLUTICASONE PROPIONATE 50 MCG/ACT NA SUSP: 2.0000 | Freq: Every day | NASAL | Status: DC

## 2014-07-02 MED ORDER — FREESTYLE LANCETS MISC: Status: DC

## 2014-07-02 MED ORDER — GLUCOSE BLOOD VI STRP: ORAL_STRIP | Status: DC

## 2014-07-02 MED ORDER — METFORMIN HCL ER 500 MG PO TB24: 500.0000 mg | ORAL_TABLET | Freq: Every day | ORAL | Status: DC

## 2014-07-02 NOTE — Progress Notes (Signed)
Subjective:    Patient ID: Tami Jacobson, female    DOB: 23-Apr-1927, 81 y.o.   MRN: [id number]  HPI   Here for medicare wellness  Diet: heart healthy, diabetic Physical activity: sedentary Depression/mood screen: negative Hearing: intact to whispered voice Visual acuity: grossly normal, performs annual eye exam  ADLs: capable Fall risk: none Home safety: good Cognitive evaluation: intact to orientation, naming, recall and repetition EOL planning: adv directives, full code/ I agree  I have personally reviewed and have noted 1. The patient's medical and social history 2. Their use of alcohol, tobacco or illicit drugs 3. Their current medications and supplements 4. The patient's functional ability including ADL's, fall risks, home safety risks and hearing or visual impairment. 5. Diet and physical activities 6. Evidence for depression or mood disorders  Also reviewed chronic conditions, interval events and current concerns  Past Medical History  Diagnosis Date  . Vitamin B12 deficiency   . Carcinoma in situ of breast     R 06/2006, L 06/2008, L 06/2011 LCIS  . Esophageal stricture   . Diverticulosis   . Hypothyroid   . Hyperlipidemia     intol of all statins  . Anemia   . Gastritis   . Alopecia   . Carotid bruit   . Premature ventricular contraction   . PAC (premature atrial contraction)   . Diabetes mellitus     controlled by diet  . GERD (gastroesophageal reflux disease)   . Arthritis    Family History  Problem Relation Age of Onset  . Colon cancer Mother   . Breast cancer Sister     x 3  . Diverticulitis Sister   . Cancer Sister     breast  . Heart disease Maternal Uncle     CABG  . Diverticulitis Sister   . Cancer Sister     leukemia  . Heart disease Brother   . Anesthesia problems Neg Hx    History  Substance Use Topics  . Smoking status: Never Smoker   . Smokeless tobacco: Never Used  . Alcohol Use: No    Review of Systems    Constitutional: Negative for fatigue and unexpected weight change.  Respiratory: Negative for cough, shortness of breath and wheezing.   Cardiovascular: Negative for chest pain, palpitations and leg swelling.  Gastrointestinal: Negative for nausea, abdominal pain and diarrhea.  Neurological: Negative for dizziness, weakness, light-headedness and headaches.  Psychiatric/Behavioral: Negative for dysphoric mood. The patient is not nervous/anxious.   All other systems reviewed and are negative.      Objective:    Physical Exam  Constitutional: She appears well-developed and well-nourished. No distress.  Cardiovascular: Normal rate, regular rhythm and normal heart sounds.   No murmur heard. Pulmonary/Chest: Effort normal and breath sounds normal. No respiratory distress.  Musculoskeletal: She exhibits no edema.    BP 140/70 mmHg  Pulse 60  Temp(Src) 97.8 F (36.6 C) (Oral)  Resp 16  Wt 143 lb (64.864 kg)  SpO2 93% Wt Readings from Last 3 Encounters:  07/02/14 143 lb (64.864 kg)  04/18/14 143 lb 4.8 oz (65 kg)  01/01/14 144 lb 8 oz (65.545 kg)    Lab Results  Component Value Date   WBC 5.2 07/13/2013   HGB 12.6 07/13/2013   HCT 37.4 07/13/2013   PLT 141* 07/13/2013   GLUCOSE 142* 07/13/2013   CHOL 205* 06/26/2013   TRIG 133.0 06/26/2013   HDL 54.70 06/26/2013   LDLDIRECT 128.3 12/26/2012   LDLCALC  124* 06/26/2013   ALT 11 07/13/2013   AST 16 07/13/2013   NA 143 07/13/2013   K 4.9 07/13/2013   CL 105 06/26/2013   CREATININE 0.9 07/13/2013   BUN 14.2 07/13/2013   CO2 29 07/13/2013   TSH 1.79 06/26/2013   HGBA1C 6.1 01/01/2014   MICROALBUR 0.5 06/26/2013   Lab Results  Component Value Date   VITAMINB12 391 05/17/2014     Mm Diag Breast Tomo Bilateral  06/25/2014   ADDENDUM REPORT: 06/25/2014 09:32  ADDENDUM: c: The breast tissue is heterogeneously dense, which may obscure small masses.   Electronically Signed   By: Altamese Cabal M.D.   On: 06/25/2014 09:32    06/25/2014   CLINICAL DATA:  History of bilateral malignant lumpectomies. Followup evaluation.  EXAM: DIGITAL DIAGNOSTIC bilateral MAMMOGRAM WITH 3D TOMOSYNTHESIS AND CAD  COMPARISON:  Previous examinations the most recent of which is dated 06/20/2013.  ACR Breast Density Category  insert density C  FINDINGS: There are scarring changes present bilaterally related to the patient's lumpectomies. There are scattered benign-appearing calcifications present bilaterally which are stable. There are no findings worrisome for recurrent tumor or developing malignancy.  Mammographic images were processed with CAD.  IMPRESSION: No findings worrisome for recurrent tumor or developing malignancy.  RECOMMENDATION: Bilateral diagnostic mammography in 1 year.  I have discussed the findings and recommendations with the patient. Results were also provided in writing at the conclusion of the visit. If applicable, a reminder letter will be sent to the patient regarding the next appointment.  BI-RADS CATEGORY  2: Benign.  Electronically Signed: By: Altamese Cabal M.D. On: 06/24/2014 10:56       Assessment & Plan:   AWV/z00.00 - Today patient counseled on age appropriate routine health concerns for screening and prevention, each reviewed and up to date or declined. Immunizations reviewed and up to date or declined. Labs ordered and reviewed. Risk factors for depression reviewed and negative. Hearing function and visual acuity are intact. ADLs screened and addressed as needed. Functional ability and level of safety reviewed and appropriate. Education, counseling and referrals performed based on assessed risks today. Patient provided with a copy of personalized plan for preventive services.  Problem List Items Addressed This Visit    B12 deficiency    Poor oral absorption for years - on IM replacment Lab Results  Component Value Date   [id number] 222 05/17/2014        Diabetes mellitus type 2, controlled    Diet  controlled until 12/2011- then started metformin check a1c and monitor same, tx as ongoing Intol of statins Lab Results  Component Value Date   HGBA1C 6.1 01/01/2014        Relevant Medications   metFORMIN (GLUCOPHAGE-XR) 24 hr tablet   Essential hypertension    BP Readings from Last 3 Encounters:  07/02/14 140/70  04/18/14 140/63  01/01/14 130/70   The current medical regimen is effective;  continue present plan and medications.      HYPERLIPIDEMIA    intol of all statins in past ICAS on [place] 05/2013, stable at 10/2013 check - f/u in 52mo to monitor carotids as per recommendations encouraged improvement in lifestyle control efforts with diet and exercise       Other Visit Diagnoses    Need for prophylactic vaccination against Streptococcus pneumoniae (pneumococcus)    -  Primary    Relevant Orders    Pneumococcal conjugate vaccine 13-valent (Completed)    Routine general medical examination at a  health care facility            Gwendolyn Grant, MD

## 2014-07-02 NOTE — Assessment & Plan Note (Signed)
BP Readings from Last 3 Encounters:  07/02/14 140/70  04/18/14 140/63  01/01/14 130/70   The current medical regimen is effective;  continue present plan and medications.

## 2014-07-02 NOTE — Progress Notes (Signed)
Pre visit review using our clinic review tool, if applicable. No additional management support is needed unless otherwise documented below in the visit note. 

## 2014-07-02 NOTE — Assessment & Plan Note (Signed)
Poor oral absorption for years - on IM replacment Lab Results  Component Value Date   VITAMINB12 391 05/17/2014

## 2014-07-02 NOTE — Assessment & Plan Note (Signed)
intol of all statins in past ICAS on [place] 05/2013, stable at 10/2013 check - f/u in 30mo to monitor carotids as per recommendations encouraged improvement in lifestyle control efforts with diet and exercise

## 2014-07-02 NOTE — Telephone Encounter (Signed)
emmi emailed °

## 2014-07-02 NOTE — Patient Instructions (Signed)
It was good to see you today.  We have reviewed your prior records including labs and tests today  Health Maintenance reviewed - Prevnar 13 updated today. All other recommended immunizations and age-appropriate screenings are up-to-date.  Test(s) ordered today. Your results will be released to [place] (or called to you) after review, usually within 72hours after test completion. If any changes need to be made, you will be notified at that same time.  Medications reviewed and updated, no changes recommended at this time.  Please schedule followup in 6 months for semi annual exam and labs, call sooner if problems.

## 2014-07-02 NOTE — Assessment & Plan Note (Signed)
Diet controlled until 12/2011- then started metformin check a1c and monitor same, tx as ongoing Intol of statins Lab Results  Component Value Date   HGBA1C 6.1 01/01/2014

## 2014-07-08 DIAGNOSIS — H04223 Epiphora due to insufficient drainage, bilateral lacrimal glands: Secondary | ICD-10-CM | POA: Diagnosis not present

## 2014-07-08 DIAGNOSIS — H02105 Unspecified ectropion of left lower eyelid: Secondary | ICD-10-CM | POA: Diagnosis not present

## 2014-07-08 DIAGNOSIS — H02102 Unspecified ectropion of right lower eyelid: Secondary | ICD-10-CM | POA: Diagnosis not present

## 2014-07-22 ENCOUNTER — Telehealth: Payer: Self-pay | Admitting: Internal Medicine

## 2014-07-22 NOTE — Telephone Encounter (Signed)
I called the patient on 07/22/14 at 4:50 pm regarding their concerns in taking prilosec. Patient saw two news stories stating that prilosec could cause kidney damage. I told the patient there were no alternatives to take for this medication. The patient has decided to not take prilosec at this point. They have not taken for the past three days and feel fine.

## 2014-07-29 ENCOUNTER — Ambulatory Visit (INDEPENDENT_AMBULATORY_CARE_PROVIDER_SITE_OTHER): Payer: Medicare Other | Admitting: Geriatric Medicine

## 2014-07-29 DIAGNOSIS — E538 Deficiency of other specified B group vitamins: Secondary | ICD-10-CM | POA: Diagnosis not present

## 2014-07-29 MED ORDER — CYANOCOBALAMIN 1000 MCG/ML IJ SOLN
1000.0000 ug | Freq: Once | INTRAMUSCULAR | Status: AC
  Administered 2014-07-29: 1000 ug via INTRAMUSCULAR

## 2014-08-05 DIAGNOSIS — H04562 Stenosis of left lacrimal punctum: Secondary | ICD-10-CM | POA: Diagnosis not present

## 2014-08-05 DIAGNOSIS — H02105 Unspecified ectropion of left lower eyelid: Secondary | ICD-10-CM | POA: Diagnosis not present

## 2014-08-05 DIAGNOSIS — H02102 Unspecified ectropion of right lower eyelid: Secondary | ICD-10-CM | POA: Diagnosis not present

## 2014-08-05 DIAGNOSIS — H04561 Stenosis of right lacrimal punctum: Secondary | ICD-10-CM | POA: Diagnosis not present

## 2014-08-28 ENCOUNTER — Ambulatory Visit: Payer: Medicare Other

## 2014-08-29 ENCOUNTER — Ambulatory Visit (INDEPENDENT_AMBULATORY_CARE_PROVIDER_SITE_OTHER): Payer: Medicare Other | Admitting: *Deleted

## 2014-08-29 DIAGNOSIS — E538 Deficiency of other specified B group vitamins: Secondary | ICD-10-CM | POA: Diagnosis not present

## 2014-08-29 MED ORDER — CYANOCOBALAMIN 1000 MCG/ML IJ SOLN
1000.0000 ug | Freq: Once | INTRAMUSCULAR | Status: AC
  Administered 2014-08-29: 1000 ug via INTRAMUSCULAR

## 2014-09-30 ENCOUNTER — Ambulatory Visit (INDEPENDENT_AMBULATORY_CARE_PROVIDER_SITE_OTHER): Payer: Medicare Other

## 2014-09-30 DIAGNOSIS — E538 Deficiency of other specified B group vitamins: Secondary | ICD-10-CM | POA: Diagnosis not present

## 2014-09-30 MED ORDER — CYANOCOBALAMIN 1000 MCG/ML IJ SOLN
1000.0000 ug | Freq: Once | INTRAMUSCULAR | Status: AC
  Administered 2014-09-30: 1000 ug via INTRAMUSCULAR

## 2014-10-01 ENCOUNTER — Other Ambulatory Visit: Payer: Self-pay | Admitting: Cardiology

## 2014-10-01 ENCOUNTER — Other Ambulatory Visit: Payer: Self-pay | Admitting: Internal Medicine

## 2014-10-01 ENCOUNTER — Ambulatory Visit (INDEPENDENT_AMBULATORY_CARE_PROVIDER_SITE_OTHER): Payer: Medicare Other | Admitting: Internal Medicine

## 2014-10-01 VITALS — BP 130/68 | HR 80 | Temp 98.3°F | Resp 16 | Ht 63.0 in | Wt 143.0 lb

## 2014-10-01 DIAGNOSIS — H8113 Benign paroxysmal vertigo, bilateral: Secondary | ICD-10-CM | POA: Diagnosis not present

## 2014-10-01 DIAGNOSIS — H8110 Benign paroxysmal vertigo, unspecified ear: Secondary | ICD-10-CM | POA: Insufficient documentation

## 2014-10-01 DIAGNOSIS — I6523 Occlusion and stenosis of bilateral carotid arteries: Secondary | ICD-10-CM | POA: Diagnosis not present

## 2014-10-01 DIAGNOSIS — R0989 Other specified symptoms and signs involving the circulatory and respiratory systems: Secondary | ICD-10-CM

## 2014-10-01 MED ORDER — MECLIZINE HCL 25 MG PO TABS: 25.0000 mg | ORAL_TABLET | Freq: Three times a day (TID) | ORAL | Status: DC | PRN

## 2014-10-01 NOTE — Patient Instructions (Signed)

## 2014-10-01 NOTE — Progress Notes (Signed)
Subjective:  Patient ID: Tami Jacobson, female    DOB: 01/20/1928  Age: 81 y.o. MRN: [id number]  CC: Dizziness   HPI Tami Jacobson presents for the complaint that for the last 2 days when she lays her head down on a pillow she feels like the world is spinning around. She has had this same thing happen several years ago, she was treated for vertigo and improved.  Outpatient Prescriptions Prior to Visit  Medication Sig Dispense Refill  . aspirin EC 81 MG tablet Take 1 tablet (81 mg total) by mouth daily. 90 tablet 3  . clobetasol cream (TEMOVATE) 8.00 % 1 application as needed. Irritated skin    . cyanocobalamin (,VITAMIN B-12,) 1000 MCG/ML injection Inject 1,000 mcg into the muscle every 30 (thirty) days.     . flecainide (TAMBOCOR) 50 MG tablet Take 1 tablet (50 mg total) by mouth 2 (two) times daily. 60 tablet 11  . fluticasone (FLONASE) 50 MCG/ACT nasal spray Place 2 sprays into both nostrils daily. 16 g 2  . glucose blood (FREESTYLE LITE) test strip Use to check blood sugar once daily Dx E11.09 100 each 3  . Lancets (FREESTYLE) lancets Use to help check blood sugar daily Dx E11.09 100 each 3  . metFORMIN (GLUCOPHAGE XR) 500 MG 24 hr tablet Take 1 tablet (500 mg total) by mouth daily with breakfast. 90 tablet 3  . metoprolol succinate (TOPROL-XL) 50 MG 24 hr tablet Take 1 tablet (50 mg total) by mouth daily. Take with or immediately following a meal. 30 tablet 11  . omeprazole (PRILOSEC) 20 MG capsule Take 1 capsule (20 mg total) by mouth daily. 90 capsule 3   No facility-administered medications prior to visit.    ROS Review of Systems  Constitutional: Negative.  Negative for fever, chills, diaphoresis, appetite change and fatigue.  HENT: Negative.  Negative for congestion, sinus pressure, sore throat, tinnitus, trouble swallowing and voice change.   Eyes: Negative.  Negative for photophobia, redness and visual disturbance.  Respiratory: Negative.  Negative for cough,  choking, chest tightness, shortness of breath and stridor.   Cardiovascular: Negative.  Negative for chest pain, palpitations and leg swelling.  Gastrointestinal: Negative.  Negative for nausea, vomiting, abdominal pain, diarrhea, constipation and blood in stool.  Endocrine: Negative.   Genitourinary: Negative.   Musculoskeletal: Negative.   Skin: Negative.   Allergic/Immunologic: Negative.   Neurological: Positive for dizziness. Negative for seizures, syncope, facial asymmetry, speech difficulty, weakness, light-headedness and numbness.  Hematological: Negative.  Negative for adenopathy. Does not bruise/bleed easily.  Psychiatric/Behavioral: Negative.     Objective:  BP 130/68 mmHg  Pulse 80  Temp(Src) 98.3 F (36.8 C) (Oral)  Ht 5\' 3"  (1.6 m)  Wt 143 lb (64.864 kg)  BMI 25.34 kg/m2  SpO2 94%  BP Readings from Last 3 Encounters:  10/01/14 130/68  07/02/14 140/70  04/18/14 140/63    Wt Readings from Last 3 Encounters:  10/01/14 143 lb (64.864 kg)  07/02/14 143 lb (64.864 kg)  04/18/14 143 lb 4.8 oz (65 kg)    Physical Exam  Constitutional: She is oriented to person, place, and time. She appears well-developed and well-nourished. No distress.  HENT:  Head: Normocephalic and atraumatic.  Mouth/Throat: Oropharynx is clear and moist. No oropharyngeal exudate.  Eyes: Conjunctivae and EOM are normal. Pupils are equal, round, and reactive to light. Right eye exhibits no discharge. Left eye exhibits no discharge. No scleral icterus.  Neck: Normal range of motion. Neck supple. No  JVD present. No tracheal deviation present. No thyromegaly present.  Cardiovascular: Normal rate, regular rhythm, normal heart sounds and intact distal pulses.  Exam reveals no gallop and no friction rub.   No murmur heard. Pulmonary/Chest: Effort normal and breath sounds normal. No stridor. No respiratory distress. She has no wheezes. She has no rales. She exhibits no tenderness.  Abdominal: Soft. Bowel  sounds are normal. She exhibits no distension and no mass. There is no tenderness. There is no rebound and no guarding.  Musculoskeletal: Normal range of motion. She exhibits no edema or tenderness.  Lymphadenopathy:    She has no cervical adenopathy.  Neurological: She is alert and oriented to person, place, and time. She has normal strength. She displays no atrophy, no tremor and normal reflexes. No cranial nerve deficit or sensory deficit. She exhibits normal muscle tone. She displays a negative Romberg sign. She displays no seizure activity. Coordination and gait normal. She displays no Babinski's sign on the right side. She displays no Babinski's sign on the left side.  Reflex Scores:      Tricep reflexes are 0 on the right side and 0 on the left side.      Bicep reflexes are 1+ on the right side and 1+ on the left side.      Brachioradialis reflexes are 1+ on the right side and 1+ on the left side.      Patellar reflexes are 1+ on the right side and 1+ on the left side.      Achilles reflexes are 1+ on the right side and 1+ on the left side. Skin: Skin is warm and dry. No rash noted. She is not diaphoretic. No erythema. No pallor.  Psychiatric: She has a normal mood and affect. Her behavior is normal. Judgment and thought content normal.  Vitals reviewed.   Lab Results  Component Value Date   WBC 5.2 07/13/2013   HGB 12.6 07/13/2013   HCT 37.4 07/13/2013   PLT 141* 07/13/2013   GLUCOSE 130* 07/02/2014   CHOL 212* 07/02/2014   TRIG 114.0 07/02/2014   HDL 53.20 07/02/2014   LDLDIRECT 128.3 12/26/2012   LDLCALC 136* 07/02/2014   ALT 11 07/13/2013   AST 16 07/13/2013   NA 138 07/02/2014   K 4.0 07/02/2014   CL 101 07/02/2014   CREATININE 0.85 07/02/2014   BUN 15 07/02/2014   CO2 25 07/02/2014   TSH 1.79 06/26/2013   HGBA1C 6.1 07/02/2014   MICROALBUR 0.7 07/02/2014    Mm Diag Breast Tomo Bilateral  06/25/2014   ADDENDUM REPORT: 06/25/2014 09:32  ADDENDUM: c: The breast  tissue is heterogeneously dense, which may obscure small masses.   Electronically Signed   By: Altamese Cabal M.D.   On: 06/25/2014 09:32   06/25/2014   CLINICAL DATA:  History of bilateral malignant lumpectomies. Followup evaluation.  EXAM: DIGITAL DIAGNOSTIC bilateral MAMMOGRAM WITH 3D TOMOSYNTHESIS AND CAD  COMPARISON:  Previous examinations the most recent of which is dated 06/20/2013.  ACR Breast Density Category  insert density C  FINDINGS: There are scarring changes present bilaterally related to the patient's lumpectomies. There are scattered benign-appearing calcifications present bilaterally which are stable. There are no findings worrisome for recurrent tumor or developing malignancy.  Mammographic images were processed with CAD.  IMPRESSION: No findings worrisome for recurrent tumor or developing malignancy.  RECOMMENDATION: Bilateral diagnostic mammography in 1 year.  I have discussed the findings and recommendations with the patient. Results were also provided in writing at  the conclusion of the visit. If applicable, a reminder letter will be sent to the patient regarding the next appointment.  BI-RADS CATEGORY  2: Benign.  Electronically Signed: By: Altamese Cabal M.D. On: 06/24/2014 10:56    Assessment & Plan:   Tami Jacobson was seen today for dizziness.  Diagnoses and all orders for this visit:  BPPV (benign paroxysmal positional vertigo), bilateral - her exam and ROS are not suspicious for any neuro disease, will treat with meclizine Orders: -     meclizine (ANTIVERT) 25 MG tablet; Take 1 tablet (25 mg total) by mouth 3 (three) times daily as needed for dizziness.   I am having Tami Jacobson start on meclizine. I am also having her maintain her clobetasol cream, cyanocobalamin, aspirin EC, omeprazole, metoprolol succinate, flecainide, fluticasone, glucose blood, freestyle, and metFORMIN.  Meds ordered this encounter  Medications  . meclizine (ANTIVERT) 25 MG tablet    Sig: Take 1  tablet (25 mg total) by mouth 3 (three) times daily as needed for dizziness.    Dispense:  65 tablet    Refill:  0     Follow-up: Return in about 3 weeks (around 10/22/2014).  Scarlette Calico, MD

## 2014-10-01 NOTE — Progress Notes (Signed)
Pre visit review using our clinic review tool, if applicable. No additional management support is needed unless otherwise documented below in the visit note. 

## 2014-10-02 ENCOUNTER — Encounter: Payer: Self-pay | Admitting: Internal Medicine

## 2014-10-15 ENCOUNTER — Ambulatory Visit (HOSPITAL_COMMUNITY)
Admission: RE | Admit: 2014-10-15 | Discharge: 2014-10-15 | Disposition: A | Discharge status: Home / Self Care | Payer: Medicare Other | Source: Ambulatory Visit | Attending: Cardiology | Admitting: Cardiology

## 2014-10-15 DIAGNOSIS — R0989 Other specified symptoms and signs involving the circulatory and respiratory systems: Secondary | ICD-10-CM | POA: Diagnosis not present

## 2014-10-15 DIAGNOSIS — I6523 Occlusion and stenosis of bilateral carotid arteries: Secondary | ICD-10-CM | POA: Insufficient documentation

## 2014-10-25 ENCOUNTER — Encounter: Payer: Self-pay | Admitting: Internal Medicine

## 2014-10-30 ENCOUNTER — Ambulatory Visit (INDEPENDENT_AMBULATORY_CARE_PROVIDER_SITE_OTHER): Payer: Medicare Other

## 2014-10-30 DIAGNOSIS — E538 Deficiency of other specified B group vitamins: Secondary | ICD-10-CM | POA: Diagnosis not present

## 2014-10-30 MED ORDER — CYANOCOBALAMIN 1000 MCG/ML IJ SOLN
1000.0000 ug | Freq: Once | INTRAMUSCULAR | Status: AC
  Administered 2014-10-30: 1000 ug via SUBCUTANEOUS

## 2014-11-29 ENCOUNTER — Ambulatory Visit (INDEPENDENT_AMBULATORY_CARE_PROVIDER_SITE_OTHER): Payer: Medicare Other

## 2014-11-29 DIAGNOSIS — E538 Deficiency of other specified B group vitamins: Secondary | ICD-10-CM

## 2014-11-29 MED ORDER — CYANOCOBALAMIN 1000 MCG/ML IJ SOLN
1000.0000 ug | Freq: Once | INTRAMUSCULAR | Status: AC
  Administered 2014-11-29: 1000 ug via INTRAMUSCULAR

## 2014-12-02 ENCOUNTER — Ambulatory Visit: Payer: Medicare Other

## 2014-12-31 ENCOUNTER — Ambulatory Visit (INDEPENDENT_AMBULATORY_CARE_PROVIDER_SITE_OTHER): Payer: Medicare Other | Admitting: *Deleted

## 2014-12-31 DIAGNOSIS — E538 Deficiency of other specified B group vitamins: Secondary | ICD-10-CM

## 2014-12-31 MED ORDER — CYANOCOBALAMIN 1000 MCG/ML IJ SOLN
1000.0000 ug | Freq: Once | INTRAMUSCULAR | Status: AC
  Administered 2014-12-31: 1000 ug via INTRAMUSCULAR

## 2015-01-01 DIAGNOSIS — Z85828 Personal history of other malignant neoplasm of skin: Secondary | ICD-10-CM | POA: Diagnosis not present

## 2015-01-01 DIAGNOSIS — L814 Other melanin hyperpigmentation: Secondary | ICD-10-CM | POA: Diagnosis not present

## 2015-01-01 DIAGNOSIS — D1801 Hemangioma of skin and subcutaneous tissue: Secondary | ICD-10-CM | POA: Diagnosis not present

## 2015-01-01 DIAGNOSIS — L570 Actinic keratosis: Secondary | ICD-10-CM | POA: Diagnosis not present

## 2015-01-01 DIAGNOSIS — L821 Other seborrheic keratosis: Secondary | ICD-10-CM | POA: Diagnosis not present

## 2015-01-06 ENCOUNTER — Ambulatory Visit: Payer: Medicare Other | Admitting: Internal Medicine

## 2015-01-20 ENCOUNTER — Telehealth: Payer: Self-pay | Admitting: *Deleted

## 2015-01-20 NOTE — Telephone Encounter (Signed)
LM for the patient to call our office. I advised we received a refill request from [facility] for [facility] Pramoxine 2.5-1% cream.  Lm asking if she wants a refill on this cream.  Advised also on message that Dr. [name] has retired. She should call our office about getting established with another MD here.

## 2015-01-30 ENCOUNTER — Ambulatory Visit (INDEPENDENT_AMBULATORY_CARE_PROVIDER_SITE_OTHER): Payer: Medicare Other

## 2015-01-30 DIAGNOSIS — E538 Deficiency of other specified B group vitamins: Secondary | ICD-10-CM | POA: Diagnosis not present

## 2015-01-30 MED ORDER — CYANOCOBALAMIN 1000 MCG/ML IJ SOLN
1000.0000 ug | Freq: Once | INTRAMUSCULAR | Status: AC
  Administered 2015-01-30: 1000 ug via INTRAMUSCULAR

## 2015-02-20 ENCOUNTER — Ambulatory Visit: Payer: Medicare Other | Admitting: Family

## 2015-02-20 ENCOUNTER — Ambulatory Visit (INDEPENDENT_AMBULATORY_CARE_PROVIDER_SITE_OTHER)
Admission: RE | Admit: 2015-02-20 | Discharge: 2015-02-20 | Disposition: A | Discharge status: Home / Self Care | Payer: Medicare Other | Source: Ambulatory Visit | Attending: Internal Medicine | Admitting: Internal Medicine

## 2015-02-20 ENCOUNTER — Ambulatory Visit: Payer: Medicare Other | Admitting: Internal Medicine

## 2015-02-20 ENCOUNTER — Ambulatory Visit (INDEPENDENT_AMBULATORY_CARE_PROVIDER_SITE_OTHER): Payer: Medicare Other | Admitting: Internal Medicine

## 2015-02-20 ENCOUNTER — Other Ambulatory Visit (INDEPENDENT_AMBULATORY_CARE_PROVIDER_SITE_OTHER): Payer: Medicare Other

## 2015-02-20 ENCOUNTER — Encounter: Payer: Self-pay | Admitting: Internal Medicine

## 2015-02-20 VITALS — BP 122/64 | HR 72 | Temp 97.8°F | Ht 63.0 in | Wt 143.0 lb

## 2015-02-20 DIAGNOSIS — I6523 Occlusion and stenosis of bilateral carotid arteries: Secondary | ICD-10-CM

## 2015-02-20 DIAGNOSIS — J069 Acute upper respiratory infection, unspecified: Secondary | ICD-10-CM | POA: Diagnosis not present

## 2015-02-20 DIAGNOSIS — I10 Essential (primary) hypertension: Secondary | ICD-10-CM | POA: Diagnosis not present

## 2015-02-20 DIAGNOSIS — E119 Type 2 diabetes mellitus without complications: Secondary | ICD-10-CM

## 2015-02-20 DIAGNOSIS — R05 Cough: Secondary | ICD-10-CM | POA: Diagnosis not present

## 2015-02-20 DIAGNOSIS — B9789 Other viral agents as the cause of diseases classified elsewhere: Principal | ICD-10-CM

## 2015-02-20 LAB — HEMOGLOBIN A1C: Hgb A1c MFr Bld: 6.3 % (ref 4.6–6.5)

## 2015-02-20 NOTE — Progress Notes (Signed)
Pre visit review using our clinic review tool, if applicable. No additional management support is needed unless otherwise documented below in the visit note. 

## 2015-02-20 NOTE — Progress Notes (Signed)
Subjective:    Patient ID: Tami Jacobson, female    DOB: 1928-03-15, 81 y.o.   MRN: [id number]  HPI  Patient here for follow-up. Reviewed chronic medical issues, interval events current concerns. Currently feeling chest congestion or head cold since doing yard work over the weekend 5 days ago. Denies fever. Not any using over-the-counter symptomatically relief.   Past Medical History  Diagnosis Date  . Vitamin B12 deficiency   . Carcinoma in situ of breast     R 06/2006, L 06/2008, L 06/2011 LCIS  . Esophageal stricture   . Diverticulosis   . Hypothyroid   . Hyperlipidemia     intol of all statins  . Anemia   . Gastritis   . Alopecia   . Carotid bruit   . Premature ventricular contraction   . PAC (premature atrial contraction)   . Diabetes mellitus     controlled by diet  . GERD (gastroesophageal reflux disease)   . Arthritis     Review of Systems  Constitutional: Positive for fatigue. Negative for unexpected weight change.  HENT: Positive for congestion, sinus pressure and sneezing. Negative for sore throat.   Respiratory: Positive for cough and chest tightness. Negative for shortness of breath.   Cardiovascular: Negative for chest pain and leg swelling.       Objective:    Physical Exam  Constitutional: She appears well-developed and well-nourished. No distress.  Spouse at side  HENT:  Head: Normocephalic and atraumatic.  Right Ear: External ear normal.  Left Ear: External ear normal.  Nose: Nose normal.  Mouth/Throat: Oropharynx is clear and moist. No oropharyngeal exudate.  Eyes: Conjunctivae are normal.  Cardiovascular: Normal rate, regular rhythm and normal heart sounds.   No murmur heard. Pulmonary/Chest: Effort normal and breath sounds normal. No respiratory distress.  Musculoskeletal: She exhibits no edema.  Lymphadenopathy:    She has no cervical adenopathy.  Vitals reviewed.   BP 122/64 mmHg  Pulse 72  Temp(Src) 97.8 F (36.6 C) (Oral)  Ht  5\' 3"  (1.6 m)  Wt 143 lb (64.864 kg)  BMI 25.34 kg/m2  SpO2 93% Wt Readings from Last 3 Encounters:  02/20/15 143 lb (64.864 kg)  10/01/14 143 lb (64.864 kg)  07/02/14 143 lb (64.864 kg)    Lab Results  Component Value Date   WBC 5.2 07/13/2013   HGB 12.6 07/13/2013   HCT 37.4 07/13/2013   PLT 141* 07/13/2013   GLUCOSE 130* 07/02/2014   CHOL 212* 07/02/2014   TRIG 114.0 07/02/2014   HDL 53.20 07/02/2014   LDLDIRECT 128.3 12/26/2012   LDLCALC 136* 07/02/2014   ALT 11 07/13/2013   AST 16 07/13/2013   NA 138 07/02/2014   K 4.0 07/02/2014   CL 101 07/02/2014   CREATININE 0.85 07/02/2014   BUN 15 07/02/2014   CO2 25 07/02/2014   TSH 1.79 06/26/2013   HGBA1C 6.1 07/02/2014   MICROALBUR 0.7 07/02/2014    No results found.     Assessment & Plan:   URI with cough. Lung sounds clear but borderline hypoxemia. We'll check chest x-ray. Chest x-ray clear, continue symptomatic treatment with over-the-counter testing and hydration. If infiltrate or if worsening symptoms such as fever or productive sputum, will initiate antibiotics. Explain same to patient who understands and agrees  Problem List Items Addressed This Visit    Diabetes mellitus type 2, controlled ([place]) - Primary    Diet controlled until 12/2011- then started metformin check a1c and monitor same, tx as  ongoing Intol of statins Lab Results  Component Value Date   HGBA1C 6.1 07/02/2014        Relevant Orders   Hemoglobin A1c   Essential hypertension    BP Readings from Last 3 Encounters:  02/20/15 122/64  10/01/14 130/68  07/02/14 140/70   The current medical regimen is effective;  continue present plan and medications.       Other Visit Diagnoses    Viral URI with cough        Relevant Orders    DG Chest 2 View        Gwendolyn Grant, MD

## 2015-02-20 NOTE — Assessment & Plan Note (Signed)
Diet controlled until 12/2011- then started metformin check a1c and monitor same, tx as ongoing Intol of statins Lab Results  Component Value Date   HGBA1C 6.1 07/02/2014

## 2015-02-20 NOTE — Assessment & Plan Note (Signed)
BP Readings from Last 3 Encounters:  02/20/15 122/64  10/01/14 130/68  07/02/14 140/70   The current medical regimen is effective;  continue present plan and medications.

## 2015-02-20 NOTE — Patient Instructions (Signed)
It was good to see you today.  We have reviewed your prior records including labs and tests today  Test(s) ordered today. Your results will be released to [facility] (or called to you) after review, usually within 72hours after test completion. If any changes need to be made, you will be notified at that same time.  Medications reviewed and updated, no changes recommended at this time. Refill on medication(s) as discussed today.  We'll hold antibiotics at this time unless worsening symptoms such as fever or productive cough, or if abnormal chest x-ray. Continue over-the-counter testing and hydration and rest  Please schedule followup in 6 months with new primary care doctor, call sooner if problems.

## 2015-03-03 ENCOUNTER — Ambulatory Visit (INDEPENDENT_AMBULATORY_CARE_PROVIDER_SITE_OTHER): Payer: Medicare Other | Admitting: *Deleted

## 2015-03-03 DIAGNOSIS — E538 Deficiency of other specified B group vitamins: Secondary | ICD-10-CM | POA: Diagnosis not present

## 2015-03-03 MED ORDER — CYANOCOBALAMIN 1000 MCG/ML IJ SOLN
1000.0000 ug | Freq: Once | INTRAMUSCULAR | Status: AC
  Administered 2015-03-03: 1000 ug via INTRAMUSCULAR

## 2015-04-02 ENCOUNTER — Ambulatory Visit (INDEPENDENT_AMBULATORY_CARE_PROVIDER_SITE_OTHER): Payer: Medicare Other

## 2015-04-02 DIAGNOSIS — E538 Deficiency of other specified B group vitamins: Secondary | ICD-10-CM | POA: Diagnosis not present

## 2015-04-02 MED ORDER — CYANOCOBALAMIN 1000 MCG/ML IJ SOLN
1000.0000 ug | Freq: Once | INTRAMUSCULAR | Status: AC
  Administered 2015-04-02: 1000 ug via INTRAMUSCULAR

## 2015-04-18 ENCOUNTER — Ambulatory Visit: Payer: Medicare Other | Admitting: Cardiology

## 2015-04-28 ENCOUNTER — Other Ambulatory Visit: Payer: Self-pay | Admitting: Cardiology

## 2015-04-28 NOTE — Telephone Encounter (Signed)
Rx request sent to pharmacy.  

## 2015-05-02 NOTE — Progress Notes (Signed)
HPI: FU palpitations and cerebrovascular disease; history of PACs and PVCs with normal LV function. She had significant palpitations in the past despite beta-blockade. She is therefore on flecainide 50 mg p.o. b.i.d. A previous catheterization in June 2000 showed normal coronary arteries and normal LV function. Last Myoview in October of 2004 showed an ejection fraction of 70% with apical thinning and probable normal perfusion. Echo 1/16 showed normal LV function, mild LAE. Carotid Dopplers in July of 2016 showed 1-39% bilateral stenosis; fu recommended in one year. Since I last saw her, the patient has dyspnea with more extreme activities but not with routine activities. It is relieved with rest. It is not associated with chest pain. There is no orthopnea, PND or pedal edema. There is no syncope or palpitations. There is no exertional chest pain.   Current Outpatient Prescriptions  Medication Sig Dispense Refill  . aspirin EC 81 MG tablet Take 1 tablet (81 mg total) by mouth daily. 90 tablet 3  . clobetasol cream (TEMOVATE) [id number] % 1 application as needed. Irritated skin    . cyanocobalamin (,VITAMIN B-12,) 1000 MCG/ML injection Inject 1,000 mcg into the muscle every 30 (thirty) days.     . flecainide (TAMBOCOR) 50 MG tablet Take 1 tablet (50 mg total) by mouth 2 (two) times daily. 60 tablet 11  . fluticasone (FLONASE) 50 MCG/ACT nasal spray Place 2 sprays into both nostrils daily. 16 g 2  . Glucose Blood (FREESTYLE LITE TEST VI)     . glucose blood (FREESTYLE LITE) test strip Use to check blood sugar once daily Dx E11.09 100 each 3  . Lancets (FREESTYLE) lancets Use to help check blood sugar daily Dx E11.09 100 each 3  . meclizine (ANTIVERT) 25 MG tablet Take 1 tablet (25 mg total) by mouth 3 (three) times daily as needed for dizziness. 65 tablet 0  . metFORMIN (GLUCOPHAGE XR) 500 MG 24 hr tablet Take 1 tablet (500 mg total) by mouth daily with breakfast. 90 tablet 3  . metoprolol  succinate (TOPROL-XL) 50 MG 24 hr tablet TAKE ONE TABLET BY MOUTH ONCE DAILY WITH OR IMMEDIATELY FOLLOWING A MEAL 30 tablet 0  . omeprazole (PRILOSEC) 20 MG capsule Take 1 capsule (20 mg total) by mouth daily. 90 capsule 3   No current facility-administered medications for this visit.     Past Medical History  Diagnosis Date  . Vitamin B12 deficiency   . Carcinoma in situ of breast     R 06/2006, L 06/2008, L 06/2011 LCIS  . Esophageal stricture   . Diverticulosis   . Hypothyroid   . Hyperlipidemia     intol of all statins  . Anemia   . Gastritis   . Alopecia   . Carotid bruit   . Premature ventricular contraction   . PAC (premature atrial contraction)   . Diabetes mellitus     controlled by diet  . GERD (gastroesophageal reflux disease)   . Arthritis     Past Surgical History  Procedure Laterality Date  . Abdominal hysterectomy  1979  . Appendectomy  2002  . Cholecystectomy  2002  . Tonsillectomy  1942  . Esophageal dilation  2006  . Breast lumpectomy  2008, 2010    X 2  . Cataract extraction    . Breast biopsy  07/02/2011    Procedure: BREAST BIOPSY WITH NEEDLE LOCALIZATION;  Surgeon: Shann Medal, MD;  Location: [facility];  Service: General;  Laterality: Left;  left breast  atypical hyperplasia needle localization biopsy    Social History   Social History  . Marital Status: Married    Spouse Name: N/A  . Number of Children: N/A  . Years of Education: N/A   Occupational History  . Not on file.   Social History Main Topics  . Smoking status: Never Smoker   . Smokeless tobacco: Never Used  . Alcohol Use: No  . Drug Use: No  . Sexual Activity: Not on file   Other Topics Concern  . Not on file   Social History Narrative   Retired Network engineer   Married, lives with spouse    Family History  Problem Relation Age of Onset  . Colon cancer Mother   . Heart attack Mother   . Breast cancer Sister   . Heart disease Maternal Uncle     CABG  . Breast cancer  Sister   . Heart disease Brother   . Anesthesia problems Neg Hx   . Breast cancer Sister   . Heart disease Sister   . COPD Brother   . Heart disease Brother     ROS: no fevers or chills, productive cough, hemoptysis, dysphasia, odynophagia, melena, hematochezia, dysuria, hematuria, rash, seizure activity, orthopnea, PND, pedal edema, claudication. Remaining systems are negative.  Physical Exam: Well-developed well-nourished in no acute distress.  Skin is warm and dry.  HEENT is normal.  Neck is supple.  Chest is clear to auscultation with normal expansion.  Cardiovascular exam is regular rate and rhythm.  Abdominal exam nontender or distended. No masses palpated. Extremities show no edema. neuro grossly intact  ECG Sinus rhythm at a rate of 70.Inferior posterior infarct.

## 2015-05-05 ENCOUNTER — Ambulatory Visit (INDEPENDENT_AMBULATORY_CARE_PROVIDER_SITE_OTHER): Payer: Medicare Other | Admitting: Cardiology

## 2015-05-05 ENCOUNTER — Encounter: Payer: Self-pay | Admitting: Cardiology

## 2015-05-05 ENCOUNTER — Ambulatory Visit (INDEPENDENT_AMBULATORY_CARE_PROVIDER_SITE_OTHER): Payer: Medicare Other

## 2015-05-05 VITALS — BP 130/66 | HR 70 | Ht 63.0 in | Wt 143.3 lb

## 2015-05-05 DIAGNOSIS — R002 Palpitations: Secondary | ICD-10-CM | POA: Diagnosis not present

## 2015-05-05 DIAGNOSIS — R9431 Abnormal electrocardiogram [ECG] [EKG]: Secondary | ICD-10-CM | POA: Insufficient documentation

## 2015-05-05 DIAGNOSIS — E538 Deficiency of other specified B group vitamins: Secondary | ICD-10-CM | POA: Diagnosis not present

## 2015-05-05 DIAGNOSIS — R0600 Dyspnea, unspecified: Secondary | ICD-10-CM

## 2015-05-05 MED ORDER — CYANOCOBALAMIN 1000 MCG/ML IJ SOLN
1000.0000 ug | Freq: Once | INTRAMUSCULAR | Status: AC
  Administered 2015-05-05: 1000 ug via INTRAMUSCULAR

## 2015-05-05 NOTE — Assessment & Plan Note (Signed)
Symptoms are controlled on Flecainide and Toprol. Continue.

## 2015-05-05 NOTE — Assessment & Plan Note (Signed)
Question inferior posterior infarct on electrocardiogram. Some dyspnea on exertion. Scheduled study for risk stratification.

## 2015-05-05 NOTE — Patient Instructions (Signed)
Medication Instructions:   NO CHANGE  Testing/Procedures:  Your physician has requested that you have a lexiscan myoview. For further information please visit [email or web address]. Please follow instruction sheet, as given.    Follow-Up:  Your physician wants you to follow-up in: ONE YEAR WITH DR CRENSHAW You will receive a reminder letter in the mail two months in advance. If you don't receive a letter, please call our office to schedule the follow-up appointment.   If you need a refill on your cardiac medications before your next appointment, please call your pharmacy.  

## 2015-05-05 NOTE — Assessment & Plan Note (Signed)
Blood pressure controlled. Continue present medications. 

## 2015-05-05 NOTE — Assessment & Plan Note (Signed)
Follow-up carotid Dopplers July 2017. 

## 2015-05-08 ENCOUNTER — Telehealth (HOSPITAL_COMMUNITY): Payer: Self-pay

## 2015-05-08 NOTE — Telephone Encounter (Signed)
Encounter complete. 

## 2015-05-13 ENCOUNTER — Other Ambulatory Visit: Payer: Self-pay | Admitting: Oncology

## 2015-05-13 ENCOUNTER — Ambulatory Visit (HOSPITAL_COMMUNITY)
Admission: RE | Admit: 2015-05-13 | Discharge: 2015-05-13 | Disposition: A | Discharge status: Home / Self Care | Payer: Medicare Other | Source: Ambulatory Visit | Attending: Cardiology | Admitting: Cardiology

## 2015-05-13 DIAGNOSIS — E119 Type 2 diabetes mellitus without complications: Secondary | ICD-10-CM | POA: Diagnosis not present

## 2015-05-13 DIAGNOSIS — R0600 Dyspnea, unspecified: Secondary | ICD-10-CM | POA: Diagnosis not present

## 2015-05-13 DIAGNOSIS — R0609 Other forms of dyspnea: Secondary | ICD-10-CM | POA: Diagnosis not present

## 2015-05-13 DIAGNOSIS — I779 Disorder of arteries and arterioles, unspecified: Secondary | ICD-10-CM | POA: Diagnosis not present

## 2015-05-13 DIAGNOSIS — Z853 Personal history of malignant neoplasm of breast: Secondary | ICD-10-CM

## 2015-05-13 DIAGNOSIS — Z8249 Family history of ischemic heart disease and other diseases of the circulatory system: Secondary | ICD-10-CM | POA: Diagnosis not present

## 2015-05-13 DIAGNOSIS — R002 Palpitations: Secondary | ICD-10-CM | POA: Diagnosis not present

## 2015-05-13 DIAGNOSIS — R9431 Abnormal electrocardiogram [ECG] [EKG]: Secondary | ICD-10-CM | POA: Diagnosis not present

## 2015-05-13 DIAGNOSIS — I10 Essential (primary) hypertension: Secondary | ICD-10-CM | POA: Insufficient documentation

## 2015-05-13 LAB — MYOCARDIAL PERFUSION IMAGING
CHL CUP NUCLEAR SDS: 1
CHL CUP STRESS STAGE 1 HR: 57 {beats}/min
CHL CUP STRESS STAGE 1 SBP: 138 mmHg
CHL CUP STRESS STAGE 1 SPEED: 0 mph
CHL CUP STRESS STAGE 2 GRADE: 0 %
CHL CUP STRESS STAGE 2 SPEED: 0 mph
CHL CUP STRESS STAGE 3 SBP: 149 mmHg
CHL CUP STRESS STAGE 4 DBP: 60 mmHg
CSEPPHR: 73 {beats}/min
CSEPPMHR: 54 %
Estimated workload: 1 METS
LV dias vol: 66 mL
LV sys vol: 18 mL
Peak BP: 149 mmHg
Rest HR: 60 {beats}/min
SRS: 1
SSS: 2
Stage 1 DBP: 56 mmHg
Stage 1 Grade: 0 %
Stage 2 HR: 57 {beats}/min
Stage 3 DBP: 51 mmHg
Stage 3 Grade: 0 %
Stage 3 HR: 73 {beats}/min
Stage 3 Speed: 0 mph
Stage 4 Grade: 0 %
Stage 4 HR: 65 {beats}/min
Stage 4 SBP: 143 mmHg
Stage 4 Speed: 0 mph
TID: 1.35

## 2015-05-13 MED ORDER — AMINOPHYLLINE 25 MG/ML IV SOLN
75.0000 mg | Freq: Once | INTRAVENOUS | Status: AC
  Administered 2015-05-13: 75 mg via INTRAVENOUS

## 2015-05-13 MED ORDER — REGADENOSON 0.4 MG/5ML IV SOLN
0.4000 mg | Freq: Once | INTRAVENOUS | Status: AC
  Administered 2015-05-13: 0.4 mg via INTRAVENOUS

## 2015-05-13 MED ORDER — TECHNETIUM TC 99M SESTAMIBI GENERIC - CARDIOLITE
10.3000 | Freq: Once | INTRAVENOUS | Status: AC | PRN
  Administered 2015-05-13: 10.3 via INTRAVENOUS

## 2015-05-13 MED ORDER — TECHNETIUM TC 99M SESTAMIBI GENERIC - CARDIOLITE
30.6000 | Freq: Once | INTRAVENOUS | Status: AC | PRN
  Administered 2015-05-13: 30.6 via INTRAVENOUS

## 2015-05-16 ENCOUNTER — Other Ambulatory Visit: Payer: Self-pay | Admitting: Cardiology

## 2015-05-20 DIAGNOSIS — Z961 Presence of intraocular lens: Secondary | ICD-10-CM | POA: Diagnosis not present

## 2015-05-20 DIAGNOSIS — H26493 Other secondary cataract, bilateral: Secondary | ICD-10-CM | POA: Diagnosis not present

## 2015-05-20 DIAGNOSIS — H524 Presbyopia: Secondary | ICD-10-CM | POA: Diagnosis not present

## 2015-05-28 ENCOUNTER — Other Ambulatory Visit: Payer: Self-pay | Admitting: Cardiology

## 2015-05-28 NOTE — Telephone Encounter (Signed)
Rx(s) sent to pharmacy electronically.  

## 2015-06-03 ENCOUNTER — Ambulatory Visit (INDEPENDENT_AMBULATORY_CARE_PROVIDER_SITE_OTHER): Payer: Medicare Other | Admitting: General Practice

## 2015-06-03 DIAGNOSIS — E539 Vitamin B deficiency, unspecified: Secondary | ICD-10-CM

## 2015-06-03 MED ORDER — CYANOCOBALAMIN 1000 MCG/ML IJ SOLN
1000.0000 ug | Freq: Once | INTRAMUSCULAR | Status: AC
  Administered 2015-06-03: 1000 ug via INTRAMUSCULAR

## 2015-06-16 ENCOUNTER — Encounter: Payer: Self-pay | Admitting: Internal Medicine

## 2015-06-16 ENCOUNTER — Ambulatory Visit (INDEPENDENT_AMBULATORY_CARE_PROVIDER_SITE_OTHER): Payer: Medicare Other | Admitting: Internal Medicine

## 2015-06-16 VITALS — BP 140/70 | HR 71 | Temp 97.8°F | Resp 16 | Wt 143.0 lb

## 2015-06-16 DIAGNOSIS — K219 Gastro-esophageal reflux disease without esophagitis: Secondary | ICD-10-CM

## 2015-06-16 DIAGNOSIS — E119 Type 2 diabetes mellitus without complications: Secondary | ICD-10-CM

## 2015-06-16 DIAGNOSIS — K5900 Constipation, unspecified: Secondary | ICD-10-CM

## 2015-06-16 DIAGNOSIS — R110 Nausea: Secondary | ICD-10-CM | POA: Diagnosis not present

## 2015-06-16 MED ORDER — METFORMIN HCL ER 500 MG PO TB24: 500.0000 mg | ORAL_TABLET | Freq: Every day | ORAL | Status: DC

## 2015-06-16 MED ORDER — RANITIDINE HCL 150 MG PO TABS: 150.0000 mg | ORAL_TABLET | Freq: Two times a day (BID) | ORAL | Status: DC

## 2015-06-16 NOTE — Patient Instructions (Addendum)
You should start taking colace or docusate daily for a stool softener.  Increase it or decrease the dose as needed.   Use the miralax as needed for constipation.   Try increasing your water intake.  Regular exercise also helps.  Stop the omeprazole.  Start zantac 150 mg twice a day for your heartburn.    Continue to monitor your sugars at home.  We will check your blood work in May.    I will see you in May.  Call sooner if you have any questions.

## 2015-06-16 NOTE — Progress Notes (Signed)
Subjective:    Patient ID: Tami Jacobson, female    DOB: 02/25/1928, 82 y.o.   MRN: [id number]  HPI She is here for an acute visit for nausea, not feeling well.   Nausea:  It started about 3-4 weeks ago.  She would have a wave of nausea 1-2 times a day that was very transient.  Last Wednesday was the last day she had it.  She has not had any nausea since then.  She has had constipation for about one month.  She had a small bowel movement this morning, but her last BM was Friday.   She has had constipation in the past, but it has not lasted this long.  She has not taken anything for it.    GERD:  She has been taking omeprazole but is concerned about side effects.  She would like to come off of the medication.   Diabetes:  Her sugar was 150 recently and it has never that high.  That was first in the morning.   She checks her sugars on occasion at home, but not regularly.  She is taking her metformin daily.  She was concerned the nausea was related to the sugars being high.    Medications and allergies reviewed with patient and updated if appropriate.  Patient Active Problem List   Diagnosis Date Noted  . Abnormal electrocardiogram 05/05/2015  . BPPV (benign paroxysmal positional vertigo) 10/01/2014  . Murmur 04/18/2014  . Anal fissure 12/16/2011  . Fibrocystic breast changes. Left.  Biopsy 07/02/2011. 07/14/2011  . History of breast cancer, Right, T1c, N0, lumpectomy 06/26/2008.   Left, T1c, N0, lumpectomy 06/24/2006. 05/12/2011  . Diabetes mellitus type 2, controlled ([place]) 06/11/2009  . PREMATURE VENTRICULAR CONTRACTIONS 08/19/2008  . B12 deficiency 08/29/2007  . ESOPHAGEAL STRICTURE 04/27/2007  . GERD (gastroesophageal reflux disease) 04/27/2007  . DIVERTICULOSIS, COLON 04/27/2007  . OTHER ALOPECIA 02/14/2007  . HYPERLIPIDEMIA 01/02/2007  . ANEMIA-NOS 01/02/2007  . Essential hypertension 01/02/2007  . Carotid stenosis 08/22/2006    Current Outpatient Prescriptions on File  Prior to Visit  Medication Sig Dispense Refill  . aspirin EC 81 MG tablet Take 1 tablet (81 mg total) by mouth daily. 90 tablet 3  . clobetasol cream (TEMOVATE) [id number] % 1 application as needed. Irritated skin    . cyanocobalamin (,VITAMIN B-12,) 1000 MCG/ML injection Inject 1,000 mcg into the muscle every 30 (thirty) days.     . flecainide (TAMBOCOR) 50 MG tablet TAKE ONE TABLET BY MOUTH TWICE DAILY 60 tablet 0  . fluticasone (FLONASE) 50 MCG/ACT nasal spray Place 2 sprays into both nostrils daily. 16 g 2  . Glucose Blood (FREESTYLE LITE TEST VI)     . glucose blood (FREESTYLE LITE) test strip Use to check blood sugar once daily Dx E11.09 100 each 3  . Lancets (FREESTYLE) lancets Use to help check blood sugar daily Dx E11.09 100 each 3  . meclizine (ANTIVERT) 25 MG tablet Take 1 tablet (25 mg total) by mouth 3 (three) times daily as needed for dizziness. 65 tablet 0  . metoprolol succinate (TOPROL-XL) 50 MG 24 hr tablet TAKE ONE TABLET BY MOUTH ONCE DAILY WITH OR IMMEDIATELY FOLLOWING A MEAL 30 tablet 0  . metoprolol succinate (TOPROL-XL) 50 MG 24 hr tablet TAKE ONE TABLET BY MOUTH ONCE DAILY WITH OR IMMEDIATELY FOLLOWING A MEAL 30 tablet 11  . omeprazole (PRILOSEC) 20 MG capsule Take 1 capsule (20 mg total) by mouth daily. 90 capsule 3   No  current facility-administered medications on file prior to visit.    Past Medical History  Diagnosis Date  . Vitamin B12 deficiency   . Carcinoma in situ of breast     R 06/2006, L 06/2008, L 06/2011 LCIS  . Esophageal stricture   . Diverticulosis   . Hypothyroid   . Hyperlipidemia     intol of all statins  . Anemia   . Gastritis   . Alopecia   . Carotid bruit   . Premature ventricular contraction   . PAC (premature atrial contraction)   . Diabetes mellitus     controlled by diet  . GERD (gastroesophageal reflux disease)   . Arthritis     Past Surgical History  Procedure Laterality Date  . Abdominal hysterectomy  1979  . Appendectomy   2002  . Cholecystectomy  2002  . Tonsillectomy  1942  . Esophageal dilation  2006  . Breast lumpectomy  2008, 2010    X 2  . Cataract extraction    . Breast biopsy  07/02/2011    Procedure: BREAST BIOPSY WITH NEEDLE LOCALIZATION;  Surgeon: Shann Medal, MD;  Location: [place];  Service: General;  Laterality: Left;  left breast atypical hyperplasia needle localization biopsy    Social History   Social History  . Marital Status: Married    Spouse Name: N/A  . Number of Children: N/A  . Years of Education: N/A   Social History Main Topics  . Smoking status: Never Smoker   . Smokeless tobacco: Never Used  . Alcohol Use: No  . Drug Use: No  . Sexual Activity: Not Asked   Other Topics Concern  . None   Social History Narrative   Retired Network engineer   Married, lives with spouse    Family History  Problem Relation Age of Onset  . Colon cancer Mother   . Heart attack Mother   . Breast cancer Sister   . Heart disease Maternal Uncle     CABG  . Breast cancer Sister   . Heart disease Brother   . Anesthesia problems Neg Hx   . Breast cancer Sister   . Heart disease Sister   . COPD Brother   . Heart disease Brother     Review of Systems  Constitutional: Negative for fever.  Respiratory: Negative for shortness of breath.   Cardiovascular: Negative for chest pain, palpitations and leg swelling.  Gastrointestinal: Positive for nausea and constipation. Negative for vomiting, abdominal pain and blood in stool.  Neurological: Positive for dizziness (recently had it one day only) and headaches (low grade with consitpation). Negative for light-headedness.       Objective:   Filed Vitals:   06/16/15 0940  BP: 140/70  Pulse: 71  Temp: 97.8 F (36.6 C)  Resp: 16   Filed Weights   06/16/15 0940  Weight: 143 lb (64.864 kg)   Body mass index is 25.34 kg/(m^2).   Physical Exam Constitutional: Appears well-developed and well-nourished. No distress.  Neck: Neck supple. No  tracheal deviation present. No thyromegaly present.  Left carotid bruit. No cervical adenopathy.   Cardiovascular: Normal rate, regular rhythm and normal heart sounds.   2/6 systolic murmur.  No edema Pulmonary/Chest: Effort normal and breath sounds normal. No respiratory distress. No wheezes. Abdomen: soft, nontender       Assessment & Plan:    Nausea: Resolved Hard to know cause Will just monitor for now  Constipation: Mild Start a stool softener since she tends to get  constipation - titrate as needed High fiber diet Increase water intake Exercise  See Problem List for Assessment and Plan of chronic medical problems.  Has an appt in may

## 2015-06-16 NOTE — Progress Notes (Signed)
Pre visit review using our clinic review tool, if applicable. No additional management support is needed unless otherwise documented below in the visit note. 

## 2015-06-17 ENCOUNTER — Other Ambulatory Visit: Payer: Self-pay | Admitting: *Deleted

## 2015-06-17 MED ORDER — FLECAINIDE ACETATE 50 MG PO TABS: 50.0000 mg | ORAL_TABLET | Freq: Two times a day (BID) | ORAL | Status: DC

## 2015-06-17 NOTE — Assessment & Plan Note (Signed)
She has an appt in two months and would like to get blood work at that time Check sugars more regularly at home I doubt her nausea was related to her sugar, given lack of other symptoms and only mild elevation in sugar Diabetic diet Follow up in 2 months - call with concerns prior

## 2015-06-17 NOTE — Assessment & Plan Note (Signed)
GERD diet Stop omeprazole Start zantac 1-2 times daily  Follow up in two months

## 2015-06-25 ENCOUNTER — Ambulatory Visit
Admission: RE | Admit: 2015-06-25 | Discharge: 2015-06-25 | Disposition: A | Discharge status: Home / Self Care | Payer: Medicare Other | Source: Ambulatory Visit | Attending: Oncology | Admitting: Oncology

## 2015-06-25 DIAGNOSIS — Z853 Personal history of malignant neoplasm of breast: Secondary | ICD-10-CM

## 2015-06-25 DIAGNOSIS — R922 Inconclusive mammogram: Secondary | ICD-10-CM | POA: Diagnosis not present

## 2015-06-30 DIAGNOSIS — Z01419 Encounter for gynecological examination (general) (routine) without abnormal findings: Secondary | ICD-10-CM | POA: Diagnosis not present

## 2015-06-30 DIAGNOSIS — Z800 Family history of malignant neoplasm of digestive organs: Secondary | ICD-10-CM | POA: Diagnosis not present

## 2015-06-30 DIAGNOSIS — L900 Lichen sclerosus et atrophicus: Secondary | ICD-10-CM | POA: Diagnosis not present

## 2015-06-30 DIAGNOSIS — Z1272 Encounter for screening for malignant neoplasm of vagina: Secondary | ICD-10-CM | POA: Diagnosis not present

## 2015-07-01 ENCOUNTER — Ambulatory Visit (INDEPENDENT_AMBULATORY_CARE_PROVIDER_SITE_OTHER): Payer: Medicare Other

## 2015-07-01 DIAGNOSIS — E538 Deficiency of other specified B group vitamins: Secondary | ICD-10-CM | POA: Diagnosis not present

## 2015-07-01 DIAGNOSIS — Z800 Family history of malignant neoplasm of digestive organs: Secondary | ICD-10-CM | POA: Diagnosis not present

## 2015-07-01 MED ORDER — CYANOCOBALAMIN 1000 MCG/ML IJ SOLN
1000.0000 ug | Freq: Once | INTRAMUSCULAR | Status: AC
  Administered 2015-07-01: 1000 ug via INTRAMUSCULAR

## 2015-07-04 DIAGNOSIS — M7062 Trochanteric bursitis, left hip: Secondary | ICD-10-CM | POA: Diagnosis not present

## 2015-07-28 ENCOUNTER — Telehealth: Payer: Self-pay | Admitting: Internal Medicine

## 2015-07-28 NOTE — Telephone Encounter (Signed)
Spoke with patient and scheduled OV with Dr. Silverio Decamp on 09/17/15 to get Rx renewed.

## 2015-08-04 ENCOUNTER — Ambulatory Visit (INDEPENDENT_AMBULATORY_CARE_PROVIDER_SITE_OTHER): Payer: Medicare Other

## 2015-08-04 DIAGNOSIS — E538 Deficiency of other specified B group vitamins: Secondary | ICD-10-CM | POA: Diagnosis not present

## 2015-08-04 MED ORDER — CYANOCOBALAMIN 1000 MCG/ML IJ SOLN
1000.0000 ug | Freq: Once | INTRAMUSCULAR | Status: AC
  Administered 2015-08-04: 1000 ug via INTRAMUSCULAR

## 2015-08-19 DIAGNOSIS — H5713 Ocular pain, bilateral: Secondary | ICD-10-CM | POA: Diagnosis not present

## 2015-08-19 DIAGNOSIS — H26493 Other secondary cataract, bilateral: Secondary | ICD-10-CM | POA: Diagnosis not present

## 2015-08-20 ENCOUNTER — Encounter: Payer: Self-pay | Admitting: Internal Medicine

## 2015-08-20 ENCOUNTER — Ambulatory Visit (INDEPENDENT_AMBULATORY_CARE_PROVIDER_SITE_OTHER): Payer: Medicare Other | Admitting: Internal Medicine

## 2015-08-20 ENCOUNTER — Other Ambulatory Visit (INDEPENDENT_AMBULATORY_CARE_PROVIDER_SITE_OTHER): Payer: Medicare Other

## 2015-08-20 VITALS — BP 124/74 | HR 51 | Temp 97.9°F | Resp 16 | Ht 63.0 in | Wt 142.0 lb

## 2015-08-20 DIAGNOSIS — E782 Mixed hyperlipidemia: Secondary | ICD-10-CM | POA: Diagnosis not present

## 2015-08-20 DIAGNOSIS — I10 Essential (primary) hypertension: Secondary | ICD-10-CM

## 2015-08-20 DIAGNOSIS — E538 Deficiency of other specified B group vitamins: Secondary | ICD-10-CM | POA: Diagnosis not present

## 2015-08-20 DIAGNOSIS — Z0000 Encounter for general adult medical examination without abnormal findings: Secondary | ICD-10-CM | POA: Diagnosis not present

## 2015-08-20 DIAGNOSIS — E119 Type 2 diabetes mellitus without complications: Secondary | ICD-10-CM

## 2015-08-20 DIAGNOSIS — K219 Gastro-esophageal reflux disease without esophagitis: Secondary | ICD-10-CM

## 2015-08-20 LAB — CBC WITH DIFFERENTIAL/PLATELET
BASOS ABS: 0 10*3/uL (ref 0.0–0.1)
BASOS PCT: 0.3 % (ref 0.0–3.0)
EOS ABS: 0.1 10*3/uL (ref 0.0–0.7)
Eosinophils Relative: 2.2 % (ref 0.0–5.0)
HCT: 37.4 % (ref 36.0–46.0)
HEMOGLOBIN: 12.7 g/dL (ref 12.0–15.0)
LYMPHS PCT: 24.4 % (ref 12.0–46.0)
Lymphs Abs: 1.5 10*3/uL (ref 0.7–4.0)
MCHC: 33.9 g/dL (ref 30.0–36.0)
MCV: 92.7 fl (ref 78.0–100.0)
Monocytes Absolute: 0.6 10*3/uL (ref 0.1–1.0)
Monocytes Relative: 10.1 % (ref 3.0–12.0)
Neutro Abs: 3.8 10*3/uL (ref 1.4–7.7)
Neutrophils Relative %: 63 % (ref 43.0–77.0)
PLATELETS: 159 10*3/uL (ref 150.0–400.0)
RBC: 4.03 Mil/uL (ref 3.87–5.11)
RDW: 14.9 % (ref 11.5–15.5)
WBC: 6.1 10*3/uL (ref 4.0–10.5)

## 2015-08-20 LAB — COMPREHENSIVE METABOLIC PANEL
ALK PHOS: 53 U/L (ref 39–117)
ALT: 12 U/L (ref 0–35)
AST: 12 U/L (ref 0–37)
Albumin: 4.1 g/dL (ref 3.5–5.2)
BUN: 18 mg/dL (ref 6–23)
CHLORIDE: 105 meq/L (ref 96–112)
CO2: 32 mEq/L (ref 19–32)
Calcium: 9.8 mg/dL (ref 8.4–10.5)
Creatinine, Ser: 0.85 mg/dL (ref 0.40–1.20)
GFR: 67.43 mL/min (ref >60.00)
GLUCOSE: 147 mg/dL — AB (ref 70–99)
Potassium: 4.5 mEq/L (ref 3.5–5.1)
Sodium: 141 mEq/L (ref 135–145)
TOTAL PROTEIN: 6.8 g/dL (ref 6.0–8.3)
Total Bilirubin: 0.7 mg/dL (ref 0.2–1.2)

## 2015-08-20 LAB — LIPID PANEL
Cholesterol: 192 mg/dL (ref 0–200)
HDL: 42.5 mg/dL (ref >39.00)
LDL Cholesterol: 132 mg/dL — ABNORMAL HIGH (ref 0–99)
NONHDL: 149.12
Total CHOL/HDL Ratio: 5
Triglycerides: 87 mg/dL (ref 0.0–149.0)
VLDL: 17.4 mg/dL (ref 0.0–40.0)

## 2015-08-20 LAB — MICROALBUMIN / CREATININE URINE RATIO
CREATININE, U: 87.1 mg/dL
Microalb Creat Ratio: 0.8 mg/g (ref 0.0–30.0)
Microalb, Ur: <0.7 mg/dL (ref 0.0–1.9)

## 2015-08-20 LAB — TSH: TSH: 1.54 u[IU]/mL (ref 0.35–4.50)

## 2015-08-20 LAB — HEMOGLOBIN A1C: HEMOGLOBIN A1C: 6.7 % — AB (ref 4.6–6.5)

## 2015-08-20 MED ORDER — OMEPRAZOLE 20 MG PO CPDR: 20.0000 mg | DELAYED_RELEASE_CAPSULE | Freq: Every day | ORAL | Status: DC

## 2015-08-20 NOTE — Patient Instructions (Addendum)
[name] , Thank you for taking time to come for your Medicare Wellness Visit. I appreciate your ongoing commitment to your health goals. Please review the following plan we discussed and let me know if I can assist you in the future.   These are the goals we discussed: Goals    Try to increase your exercise      This is a list of the screening recommended for you and due dates:  Health Maintenance  Topic Date Due  . Shingles Vaccine  10/17/1989  . Eye exam for diabetics  03/19/2015  . Complete foot exam   07/02/2015  . Urine Protein Check  07/02/2015  . Hemoglobin A1C  08/20/2015  . Flu Shot  11/04/2015  . Tetanus Vaccine  06/11/2019  . DEXA scan (bone density measurement)  Completed  . Pneumonia vaccines  Completed    Test(s) ordered today. Your results will be released to [facility] (or called to you) after review, usually within 72hours after test completion. If any changes need to be made, you will be notified at that same time.  All other Health Maintenance issues reviewed.   All recommended immunizations and age-appropriate screenings are up-to-date or discussed.  No immunizations administered today.   Medications reviewed and updated.  No changes recommended at this time.   Please followup in 6 months  Health Maintenance, Female Adopting a healthy lifestyle and getting preventive care can go a long way to promote health and wellness. Talk with your health care provider about what schedule of regular examinations is right for you. This is a good chance for you to check in with your provider about disease prevention and staying healthy. In between checkups, there are plenty of things you can do on your own. Experts have done a lot of research about which lifestyle changes and preventive measures are most likely to keep you healthy. Ask your health care provider for more information. WEIGHT AND DIET  Eat a healthy diet  Be sure to include plenty of vegetables, fruits,  low-fat dairy products, and lean protein.  Do not eat a lot of foods high in solid fats, added sugars, or salt.  Get regular exercise. This is one of the most important things you can do for your health.  Most adults should exercise for at least 150 minutes each week. The exercise should increase your heart rate and make you sweat (moderate-intensity exercise).  Most adults should also do strengthening exercises at least twice a week. This is in addition to the moderate-intensity exercise.  Maintain a healthy weight  Body mass index (BMI) is a measurement that can be used to identify possible weight problems. It estimates body fat based on height and weight. Your health care provider can help determine your BMI and help you achieve or maintain a healthy weight.  For females 72 years of age and older:   A BMI below 18.5 is considered underweight.  A BMI of 18.5 to 24.9 is normal.  A BMI of 25 to 29.9 is considered overweight.  A BMI of 30 and above is considered obese.  Watch levels of cholesterol and blood lipids  You should start having your blood tested for lipids and cholesterol at 80 years of age, then have this test every 5 years.  You may need to have your cholesterol levels checked more often if:  Your lipid or cholesterol levels are high.  You are older than 80 years of age.  You are at high risk for  heart disease.  CANCER SCREENING   Lung Cancer  Lung cancer screening is recommended for adults 44-30 years old who are at high risk for lung cancer because of a history of smoking.  A yearly low-dose CT scan of the lungs is recommended for people who:  Currently smoke.  Have quit within the past 15 years.  Have at least a 30-pack-year history of smoking. A pack year is smoking an average of one pack of cigarettes a day for 1 year.  Yearly screening should continue until it has been 15 years since you quit.  Yearly screening should stop if you develop a  health problem that would prevent you from having lung cancer treatment.  Breast Cancer  Practice breast self-awareness. This means understanding how your breasts normally appear and feel.  It also means doing regular breast self-exams. Let your health care provider know about any changes, no matter how small.  If you are in your 20s or 30s, you should have a clinical breast exam (CBE) by a health care provider every 1-3 years as part of a regular health exam.  If you are 14 or older, have a CBE every year. Also consider having a breast X-ray (mammogram) every year.  If you have a family history of breast cancer, talk to your health care provider about genetic screening.  If you are at high risk for breast cancer, talk to your health care provider about having an MRI and a mammogram every year.  Breast cancer gene (BRCA) assessment is recommended for women who have family members with BRCA-related cancers. BRCA-related cancers include:  Breast.  Ovarian.  Tubal.  Peritoneal cancers.  Results of the assessment will determine the need for genetic counseling and BRCA1 and BRCA2 testing. Cervical Cancer Your health care provider may recommend that you be screened regularly for cancer of the pelvic organs (ovaries, uterus, and vagina). This screening involves a pelvic examination, including checking for microscopic changes to the surface of your cervix (Pap test). You may be encouraged to have this screening done every 3 years, beginning at age 65.  For women ages 26-65, health care providers may recommend pelvic exams and Pap testing every 3 years, or they may recommend the Pap and pelvic exam, combined with testing for human papilloma virus (HPV), every 5 years. Some types of HPV increase your risk of cervical cancer. Testing for HPV may also be done on women of any age with unclear Pap test results.  Other health care providers may not recommend any screening for nonpregnant women who  are considered low risk for pelvic cancer and who do not have symptoms. Ask your health care provider if a screening pelvic exam is right for you.  If you have had past treatment for cervical cancer or a condition that could lead to cancer, you need Pap tests and screening for cancer for at least 20 years after your treatment. If Pap tests have been discontinued, your risk factors (such as having a new sexual partner) need to be reassessed to determine if screening should resume. Some women have medical problems that increase the chance of getting cervical cancer. In these cases, your health care provider may recommend more frequent screening and Pap tests. Colorectal Cancer  This type of cancer can be detected and often prevented.  Routine colorectal cancer screening usually begins at 80 years of age and continues through 80 years of age.  Your health care provider may recommend screening at an earlier age if you  have risk factors for colon cancer.  Your health care provider may also recommend using home test kits to check for hidden blood in the stool.  A small camera at the end of a tube can be used to examine your colon directly (sigmoidoscopy or colonoscopy). This is done to check for the earliest forms of colorectal cancer.  Routine screening usually begins at age 37.  Direct examination of the colon should be repeated every 5-10 years through 80 years of age. However, you may need to be screened more often if early forms of precancerous polyps or small growths are found. Skin Cancer  Check your skin from head to toe regularly.  Tell your health care provider about any new moles or changes in moles, especially if there is a change in a mole's shape or color.  Also tell your health care provider if you have a mole that is larger than the size of a pencil eraser.  Always use sunscreen. Apply sunscreen liberally and repeatedly throughout the day.  Protect yourself by wearing long  sleeves, pants, a wide-brimmed hat, and sunglasses whenever you are outside. HEART DISEASE, DIABETES, AND HIGH BLOOD PRESSURE   High blood pressure causes heart disease and increases the risk of stroke. High blood pressure is more likely to develop in:  People who have blood pressure in the high end of the normal range (130-139/85-89 mm Hg).  People who are overweight or obese.  People who are African American.  If you are 30-21 years of age, have your blood pressure checked every 3-5 years. If you are 49 years of age or older, have your blood pressure checked every year. You should have your blood pressure measured twice--once when you are at a hospital or clinic, and once when you are not at a hospital or clinic. Record the average of the two measurements. To check your blood pressure when you are not at a hospital or clinic, you can use:  An automated blood pressure machine at a pharmacy.  A home blood pressure monitor.  If you are between 64 years and 72 years old, ask your health care provider if you should take aspirin to prevent strokes.  Have regular diabetes screenings. This involves taking a blood sample to check your fasting blood sugar level.  If you are at a normal weight and have a low risk for diabetes, have this test once every three years after 80 years of age.  If you are overweight and have a high risk for diabetes, consider being tested at a younger age or more often. PREVENTING INFECTION  Hepatitis B  If you have a higher risk for hepatitis B, you should be screened for this virus. You are considered at high risk for hepatitis B if:  You were born in a country where hepatitis B is common. Ask your health care provider which countries are considered high risk.  Your parents were born in a high-risk country, and you have not been immunized against hepatitis B (hepatitis B vaccine).  You have HIV or AIDS.  You use needles to inject street drugs.  You live with  someone who has hepatitis B.  You have had sex with someone who has hepatitis B.  You get hemodialysis treatment.  You take certain medicines for conditions, including cancer, organ transplantation, and autoimmune conditions. Hepatitis C  Blood testing is recommended for:  Everyone born from [birth year] through 1963.  Anyone with known risk factors for hepatitis C. Sexually transmitted infections (STIs)  You should be screened for sexually transmitted infections (STIs) including gonorrhea and chlamydia if:  You are sexually active and are younger than 80 years of age.  You are older than 80 years of age and your health care provider tells you that you are at risk for this type of infection.  Your sexual activity has changed since you were last screened and you are at an increased risk for chlamydia or gonorrhea. Ask your health care provider if you are at risk.  If you do not have HIV, but are at risk, it may be recommended that you take a prescription medicine daily to prevent HIV infection. This is called pre-exposure prophylaxis (PrEP). You are considered at risk if:  You are sexually active and do not regularly use condoms or know the HIV status of your partner(s).  You take drugs by injection.  You are sexually active with a partner who has HIV. Talk with your health care provider about whether you are at high risk of being infected with HIV. If you choose to begin PrEP, you should first be tested for HIV. You should then be tested every 3 months for as long as you are taking PrEP.  PREGNANCY   If you are premenopausal and you may become pregnant, ask your health care provider about preconception counseling.  If you may become pregnant, take 400 to 800 micrograms (mcg) of folic acid every day.  If you want to prevent pregnancy, talk to your health care provider about birth control (contraception). OSTEOPOROSIS AND MENOPAUSE   Osteoporosis is a disease in which the bones lose  minerals and strength with aging. This can result in serious bone fractures. Your risk for osteoporosis can be identified using a bone density scan.  If you are 54 years of age or older, or if you are at risk for osteoporosis and fractures, ask your health care provider if you should be screened.  Ask your health care provider whether you should take a calcium or vitamin D supplement to lower your risk for osteoporosis.  Menopause may have certain physical symptoms and risks.  Hormone replacement therapy may reduce some of these symptoms and risks. Talk to your health care provider about whether hormone replacement therapy is right for you.  HOME CARE INSTRUCTIONS   Schedule regular health, dental, and eye exams.  Stay current with your immunizations.   Do not use any tobacco products including cigarettes, chewing tobacco, or electronic cigarettes.  If you are pregnant, do not drink alcohol.  If you are breastfeeding, limit how much and how often you drink alcohol.  Limit alcohol intake to no more than 1 drink per day for nonpregnant women. One drink equals 12 ounces of beer, 5 ounces of wine, or 1 ounces of hard liquor.  Do not use street drugs.  Do not share needles.  Ask your health care provider for help if you need support or information about quitting drugs.  Tell your health care provider if you often feel depressed.  Tell your health care provider if you have ever been abused or do not feel safe at home.   This information is not intended to replace advice given to you by your health care provider. Make sure you discuss any questions you have with your health care provider.   Document Released: 10/05/2010 Document Revised: 04/12/2014 Document Reviewed: 02/21/2013 Elsevier Interactive Patient Education [employer].

## 2015-08-20 NOTE — Progress Notes (Signed)
Pre visit review using our clinic review tool, if applicable. No additional management support is needed unless otherwise documented below in the visit note. 

## 2015-08-20 NOTE — Progress Notes (Signed)
Subjective:    Patient ID: Tami Jacobson, female    DOB: 06-Jun-1927, 82 y.o.   MRN: [id number]  HPI Here for medicare wellness exam.  I have personally reviewed and have noted 1.The patient's medical and social history 2.Their use of alcohol, tobacco or illicit drugs 3.Their current medications and supplements 4.The patient's functional ability including ADL's, fall risks, home safety risks and  hearing or visual impairment. 5.Diet and physical activities 6.Evidence for depression or mood disorders 7.Care team reviewed and updated (available in snapshot)   She occasionally checks her sugars at home.  It was 130 last.    Are there smokers in your home (other than you)? No  Risk Factors Exercise: none, except daily chores Dietary issues discussed: well balanced, three meals a day, goes out for lunch a lot; eats low carbs and sugars  Cardiac risk factors: advanced age, hypertension, hyperlipidemia, and obesity (BMI >= 30 kg/m2).  Depression Screen  Have you felt down, depressed or hopeless? No  Have you felt little interest or pleasure in doing things?  No  Activities of Daily Living In your present state of health, do you have any difficulty performing the following activities?:  Driving? No Managing money?  No Feeding yourself? No Getting from bed to chair? No Climbing a flight of stairs? No Preparing food and eating?: No Bathing or showering? No Getting dressed: No Getting to/using the toilet? No Moving around from place to place: No In the past year have you fallen or had a near fall?: No   Are you sexually active?  No  Do you have more than one partner?  N/A  Hearing Difficulties: No Do you often ask people to speak up or repeat themselves? No Do you experience ringing or noises in your ears? No Do you have difficulty understanding soft or whispered voices? No Vision:              Any change  in vision: yes, some double vision - saw eye doctor yesterday             Up to date with eye exam:  yes Memory:  Do you feel that you have a problem with memory? No  Do you often misplace items? No  Do you feel safe at home?  Yes  Cognitive Testing  Alert, Orientated? Yes  Normal Appearance? Yes  Recall of three objects?  Yes  Can perform simple calculations? Yes  Displays appropriate judgment? Yes  Can read the correct time from a watch face? Yes   Advanced Directives have been discussed with the patient? Yes  Medications and allergies reviewed with patient and updated if appropriate.  Patient Active Problem List   Diagnosis Date Noted  . Abnormal electrocardiogram 05/05/2015  . BPPV (benign paroxysmal positional vertigo) 10/01/2014  . Murmur 04/18/2014  . Anal fissure 12/16/2011  . Fibrocystic breast changes. Left.  Biopsy 07/02/2011. 07/14/2011  . History of breast cancer, Right, T1c, N0, lumpectomy 06/26/2008.   Left, T1c, N0, lumpectomy 06/24/2006. 05/12/2011  . Diabetes mellitus type 2, controlled ([place]) 06/11/2009  . PREMATURE VENTRICULAR CONTRACTIONS 08/19/2008  . B12 deficiency 08/29/2007  . ESOPHAGEAL STRICTURE 04/27/2007  . GERD (gastroesophageal reflux disease) 04/27/2007  . DIVERTICULOSIS, COLON 04/27/2007  . OTHER ALOPECIA 02/14/2007  . HYPERLIPIDEMIA 01/02/2007  . Essential hypertension 01/02/2007  . Carotid stenosis 08/22/2006    Current Outpatient Prescriptions on File Prior to Visit  Medication Sig Dispense Refill  . aspirin EC 81 MG tablet Take 1  tablet (81 mg total) by mouth daily. 90 tablet 3  . clobetasol cream (TEMOVATE) [id number] % 1 application as needed. Irritated skin    . cyanocobalamin (,VITAMIN B-12,) 1000 MCG/ML injection Inject 1,000 mcg into the muscle every 30 (thirty) days.     . flecainide (TAMBOCOR) 50 MG tablet Take 1 tablet (50 mg total) by mouth 2 (two) times daily. 60 tablet 12  . fluticasone (FLONASE) 50 MCG/ACT nasal spray Place 2 sprays  into both nostrils daily. 16 g 2  . Glucose Blood (FREESTYLE LITE TEST VI)     . glucose blood (FREESTYLE LITE) test strip Use to check blood sugar once daily Dx E11.09 100 each 3  . Lancets (FREESTYLE) lancets Use to help check blood sugar daily Dx E11.09 100 each 3  . meclizine (ANTIVERT) 25 MG tablet Take 1 tablet (25 mg total) by mouth 3 (three) times daily as needed for dizziness. 65 tablet 0  . metFORMIN (GLUCOPHAGE XR) 500 MG 24 hr tablet Take 1 tablet (500 mg total) by mouth daily with breakfast. 90 tablet 0  . metoprolol succinate (TOPROL-XL) 50 MG 24 hr tablet TAKE ONE TABLET BY MOUTH ONCE DAILY WITH OR IMMEDIATELY FOLLOWING A MEAL 30 tablet 11  . ranitidine (ZANTAC) 150 MG tablet Take 1 tablet (150 mg total) by mouth 2 (two) times daily. 180 tablet 3   No current facility-administered medications on file prior to visit.    Past Medical History  Diagnosis Date  . Vitamin B12 deficiency   . Carcinoma in situ of breast     R 06/2006, L 06/2008, L 06/2011 LCIS  . Esophageal stricture   . Diverticulosis   . Hypothyroid   . Hyperlipidemia     intol of all statins  . Anemia   . Gastritis   . Alopecia   . Carotid bruit   . Premature ventricular contraction   . PAC (premature atrial contraction)   . Diabetes mellitus     controlled by diet  . GERD (gastroesophageal reflux disease)   . Arthritis     Past Surgical History  Procedure Laterality Date  . Abdominal hysterectomy  1979  . Appendectomy  2002  . Cholecystectomy  2002  . Tonsillectomy  1942  . Esophageal dilation  2006  . Breast lumpectomy  2008, 2010    X 2  . Cataract extraction    . Breast biopsy  07/02/2011    Procedure: BREAST BIOPSY WITH NEEDLE LOCALIZATION;  Surgeon: Shann Medal, MD;  Location: [facility];  Service: General;  Laterality: Left;  left breast atypical hyperplasia needle localization biopsy    Social History   Social History  . Marital Status: Married    Spouse Name: N/A  . Number of  Children: N/A  . Years of Education: N/A   Social History Main Topics  . Smoking status: Never Smoker   . Smokeless tobacco: Never Used  . Alcohol Use: No  . Drug Use: No  . Sexual Activity: Not on file   Other Topics Concern  . Not on file   Social History Narrative   Retired Network engineer   Married, lives with spouse    Family History  Problem Relation Age of Onset  . Colon cancer Mother   . Heart attack Mother   . Breast cancer Sister   . Heart disease Maternal Uncle     CABG  . Breast cancer Sister   . Heart disease Brother   . Anesthesia problems Neg Hx   .  Breast cancer Sister   . Heart disease Sister   . COPD Brother   . Heart disease Brother     Review of Systems  Constitutional: Negative for fever, chills, appetite change and fatigue.  HENT: Negative for hearing loss and tinnitus.   Eyes: Positive for visual disturbance (double vision, just saw eye doctor).  Respiratory: Negative for cough, shortness of breath and wheezing.   Cardiovascular: Negative for chest pain, palpitations and leg swelling.  Gastrointestinal: Positive for constipation. Negative for nausea, abdominal pain, diarrhea and blood in stool.       Jerrye Bushy sometimes depending on what she eats  Genitourinary: Negative for dysuria and hematuria.  Neurological: Negative for dizziness, light-headedness and headaches.  Psychiatric/Behavioral: Negative for dysphoric mood. The patient is not nervous/anxious.        Objective:   Filed Vitals:   08/20/15 0827  BP: 124/74  Pulse: 51  Temp: 97.9 F (36.6 C)  Resp: 16   Filed Weights   08/20/15 0827  Weight: 142 lb (64.411 kg)   Body mass index is 25.16 kg/(m^2).   Physical Exam Constitutional: She appears well-developed and well-nourished. No distress.  HENT:  Head: Normocephalic and atraumatic.  Right Ear: External ear normal. Normal ear canal and TM Left Ear: External ear normal.  Normal ear canal and TM Mouth/Throat: Oropharynx is clear  and moist.  Eyes: Conjunctivae and EOM are normal.  Neck: Neck supple. No tracheal deviation present. No thyromegaly present.  No carotid bruit  Cardiovascular: Normal rate, regular rhythm and normal heart sounds.   2/6 systolic murmur heard.  No edema. Pulmonary/Chest: Effort normal and breath sounds normal. No respiratory distress. She has no wheezes. She has no rales.  Breast: deferred to Gyn Abdominal: Soft. She exhibits no distension. There is no tenderness.  Lymphadenopathy: She has no cervical adenopathy.  Skin: Skin is warm and dry. She is not diaphoretic.  Psychiatric: She has a normal mood and affect. Her behavior is normal.         Assessment & Plan:   Wellness Exam: Immunizations - discussed shingles vaccine, other vaccines up to date Colonoscopy -  No longer needed at this age 11 - no longer needed at this age dexa - up to date 39 - no longer needed at this age Eye exam up to date Hearing loss - none Memory concerns/difficulties - none Independent of ADLs - fully   Patient received copy of preventative screening tests/immunizations recommended for the next 5-10 years.  See Problem List for Assessment and Plan of chronic medical problems.

## 2015-08-20 NOTE — Assessment & Plan Note (Addendum)
Check a1c, urine microalbumin Sugars well controlled at home Continue metformin at current dose

## 2015-08-20 NOTE — Assessment & Plan Note (Signed)
BP well controlled Current regimen effective and well tolerated Continue current medications at current doses  

## 2015-08-20 NOTE — Assessment & Plan Note (Signed)
Check lipid panel Stressed regular exercise, healthy diet

## 2015-08-20 NOTE — Assessment & Plan Note (Signed)
gerd not controlled with zantac alone Restart omeprazole 20 mg daily Zantac at night and as needed Has f/u with GI

## 2015-08-21 ENCOUNTER — Encounter: Payer: Self-pay | Admitting: Emergency Medicine

## 2015-09-08 ENCOUNTER — Ambulatory Visit (INDEPENDENT_AMBULATORY_CARE_PROVIDER_SITE_OTHER): Payer: Medicare Other

## 2015-09-08 DIAGNOSIS — E538 Deficiency of other specified B group vitamins: Secondary | ICD-10-CM | POA: Diagnosis not present

## 2015-09-08 MED ORDER — CYANOCOBALAMIN 1000 MCG/ML IJ SOLN
1000.0000 ug | Freq: Once | INTRAMUSCULAR | Status: AC
  Administered 2015-09-08: 1000 ug via INTRAMUSCULAR

## 2015-09-17 ENCOUNTER — Telehealth: Payer: Self-pay | Admitting: Gastroenterology

## 2015-09-17 ENCOUNTER — Encounter: Payer: Self-pay | Admitting: Gastroenterology

## 2015-09-17 ENCOUNTER — Ambulatory Visit (INDEPENDENT_AMBULATORY_CARE_PROVIDER_SITE_OTHER): Payer: Medicare Other | Admitting: Gastroenterology

## 2015-09-17 VITALS — BP 126/60 | HR 80 | Ht 63.0 in | Wt 143.0 lb

## 2015-09-17 DIAGNOSIS — K219 Gastro-esophageal reflux disease without esophagitis: Secondary | ICD-10-CM | POA: Diagnosis not present

## 2015-09-17 DIAGNOSIS — K649 Unspecified hemorrhoids: Secondary | ICD-10-CM | POA: Diagnosis not present

## 2015-09-17 MED ORDER — HYDROCORTISONE 2.5 % RE CREA
1.0000 | TOPICAL_CREAM | Freq: Two times a day (BID) | RECTAL | Status: DC
Start: 2015-09-17 — End: 2016-02-10

## 2015-09-17 MED ORDER — OMEPRAZOLE 20 MG PO CPDR: 20.0000 mg | DELAYED_RELEASE_CAPSULE | Freq: Every day | ORAL | Status: DC

## 2015-09-17 NOTE — Progress Notes (Signed)
Tami Jacobson    [id number]    [birth year]  Primary Care Physician:Stacy Lorretta Harp, MD  Referring Physician: Binnie Rail, MD [name],  [id number]  Chief complaint:  GERD, hemorrhoids  HPI: 82 year old female with history of gastroesophageal reflux, esophageal stricture and hemorrhoids previously followed by Dr. Olevia Perches last seen in July 2015 is here to establish care and follow-up. Patient reports GERD symptoms are well controlled with Prilosec 20 mg once daily and denies any dysphagia or odynophagia. She has intermittent burning, itching with hemorrhoids that's usually controlled with rectal hydrocortisone Cream as needed and is requesting a refill. Denies any nausea, vomiting, abdominal pain, melena or bright red blood per rectum. She is doing well overall and has no specific complaints  Outpatient Encounter Prescriptions as of 09/17/2015  Medication Sig  . aspirin EC 81 MG tablet Take 1 tablet (81 mg total) by mouth daily.  . clobetasol cream (TEMOVATE) [id number] % 1 application as needed. Irritated skin  . cyanocobalamin (,VITAMIN B-12,) 1000 MCG/ML injection Inject 1,000 mcg into the muscle every 30 (thirty) days.   . flecainide (TAMBOCOR) 50 MG tablet Take 1 tablet (50 mg total) by mouth 2 (two) times daily.  . Glucose Blood (FREESTYLE LITE TEST VI)   . glucose blood (FREESTYLE LITE) test strip Use to check blood sugar once daily Dx E11.09  . Lancets (FREESTYLE) lancets Use to help check blood sugar daily Dx E11.09  . meclizine (ANTIVERT) 25 MG tablet Take 1 tablet (25 mg total) by mouth 3 (three) times daily as needed for dizziness.  . metFORMIN (GLUCOPHAGE XR) 500 MG 24 hr tablet Take 1 tablet (500 mg total) by mouth daily with breakfast.  . metoprolol succinate (TOPROL-XL) 50 MG 24 hr tablet TAKE ONE TABLET BY MOUTH ONCE DAILY WITH OR IMMEDIATELY FOLLOWING A MEAL  . omeprazole (PRILOSEC) 20 MG capsule Take 1 capsule (20 mg total) by mouth daily.  .  ranitidine (ZANTAC) 150 MG tablet Take 1 tablet (150 mg total) by mouth 2 (two) times daily.  . hydrocortisone (ANUSOL-HC) 2.5 % rectal cream Place 1 application rectally 2 (two) times daily.  . [DISCONTINUED] fluticasone (FLONASE) 50 MCG/ACT nasal spray Place 2 sprays into both nostrils daily.   No facility-administered encounter medications on file as of 09/17/2015.    Allergies as of 09/17/2015 - Review Complete 09/17/2015  Allergen Reaction Noted  . Cephalexin    . Codeine    . Lansoprazole    . Levofloxacin    . Nitrofurantoin    . Rofecoxib    . Statins      Past Medical History  Diagnosis Date  . Vitamin B12 deficiency   . Carcinoma in situ of breast     R 06/2006, L 06/2008, L 06/2011 LCIS  . Esophageal stricture   . Diverticulosis   . Hypothyroid   . Hyperlipidemia     intol of all statins  . Anemia   . Gastritis   . Alopecia   . Carotid bruit   . Premature ventricular contraction   . PAC (premature atrial contraction)   . Diabetes mellitus     controlled by diet  . GERD (gastroesophageal reflux disease)   . Arthritis     Past Surgical History  Procedure Laterality Date  . Abdominal hysterectomy  1979  . Appendectomy  2002  . Cholecystectomy  2002  . Tonsillectomy  1942  . Esophageal dilation  2006  .  Breast lumpectomy  2008, 2010    X 2  . Cataract extraction    . Breast biopsy  07/02/2011    Procedure: BREAST BIOPSY WITH NEEDLE LOCALIZATION;  Surgeon: Shann Medal, MD;  Location: [facility];  Service: General;  Laterality: Left;  left breast atypical hyperplasia needle localization biopsy    Family History  Problem Relation Age of Onset  . Colon cancer Mother   . Heart attack Mother   . Breast cancer Sister   . Heart disease Maternal Uncle     CABG  . Breast cancer Sister   . Heart disease Brother   . Anesthesia problems Neg Hx   . Breast cancer Sister   . Heart disease Sister   . COPD Brother   . Heart disease Brother     Social History    Social History  . Marital Status: Married    Spouse Name: N/A  . Number of Children: N/A  . Years of Education: N/A   Occupational History  . Not on file.   Social History Main Topics  . Smoking status: Never Smoker   . Smokeless tobacco: Never Used  . Alcohol Use: No  . Drug Use: No  . Sexual Activity: Not on file   Other Topics Concern  . Not on file   Social History Narrative   Retired Network engineer   Married, lives with spouse      Review of systems: Review of Systems  Constitutional: Negative for fever and chills.  HENT: Negative.   Eyes: Negative for blurred vision.  Respiratory: Negative for cough, shortness of breath and wheezing.   Cardiovascular: Negative for chest pain and palpitations.  Gastrointestinal: as per HPI Genitourinary: Negative for dysuria, urgency, frequency and hematuria.  Musculoskeletal: Negative for myalgias, back pain and joint pain.  Skin: Negative for itching and rash.  Neurological: Negative for dizziness, tremors, focal weakness, seizures and loss of consciousness.  Endo/Heme/Allergies: Negative for environmental allergies.  Psychiatric/Behavioral: Negative for depression, suicidal ideas and hallucinations.  All other systems reviewed and are negative.   Physical Exam: Filed Vitals:   09/17/15 0848  BP: 126/60  Pulse: 80   Gen:      No acute distress HEENT:  EOMI, sclera anicteric Neck:     No masses; no thyromegaly Lungs:    Clear to auscultation bilaterally; normal respiratory effort CV:         Regular rate and rhythm; no murmurs Abd:      + bowel sounds; soft, non-tender; no palpable masses, no distension Ext:    No edema; adequate peripheral perfusion Skin:      Warm and dry; no rash Neuro: alert and oriented x 3 Psych: normal mood and affect  Data Reviewed:  Reviewed chart in epic   Assessment and Plan/Recommendations:  82 year old female with history of GERD, esophageal stricture last dilated in 2009 and family  history of colon cancer with 5 colonoscopies that were negative for polyps, last colonoscopy in 2011 here for follow-up visit  GERD: Continue PPI daily Follow antireflux measures Hemorrhoids with occasional itching and burning: Rectal hydrocortisone cream as needed No further screening colonoscopy is recommended due to her age Medication refill for Prilosec and rectal hydrocortisone Return as needed  Denzil Magnuson , MD [phone] Mon-Fri 8a-5p [phone] after 5p, weekends, holidays  CC: Burns, Claudina Lick, MD

## 2015-09-17 NOTE — Patient Instructions (Signed)
We will send in Prilosec refills for you once youcall [place] with the name of your mail order pharmacy Hydrocortisone cream sent to pharmacy

## 2015-09-17 NOTE — Telephone Encounter (Signed)
Prilosec sent to [facility] per patients request

## 2015-09-25 ENCOUNTER — Telehealth: Payer: Self-pay | Admitting: Gastroenterology

## 2015-09-25 NOTE — Telephone Encounter (Signed)
A user error has taken place: ERROR °

## 2015-09-29 ENCOUNTER — Telehealth: Payer: Self-pay | Admitting: Gastroenterology

## 2015-09-29 NOTE — Telephone Encounter (Signed)
Called patient she stated that pharmacy said they did not get rx we did submit the prilosec rx electronically calling pharmacy to resend the rx    Spoke with pharmacy they are processing order

## 2015-09-29 NOTE — Telephone Encounter (Signed)
Called and left message.

## 2015-09-30 ENCOUNTER — Other Ambulatory Visit: Payer: Self-pay | Admitting: Internal Medicine

## 2015-10-09 ENCOUNTER — Ambulatory Visit (INDEPENDENT_AMBULATORY_CARE_PROVIDER_SITE_OTHER): Payer: Medicare Other

## 2015-10-09 DIAGNOSIS — E538 Deficiency of other specified B group vitamins: Secondary | ICD-10-CM | POA: Diagnosis not present

## 2015-10-09 MED ORDER — CYANOCOBALAMIN 1000 MCG/ML IJ SOLN
1000.0000 ug | Freq: Once | INTRAMUSCULAR | Status: AC
  Administered 2015-10-09: 1000 ug via INTRAMUSCULAR

## 2015-11-10 ENCOUNTER — Ambulatory Visit (INDEPENDENT_AMBULATORY_CARE_PROVIDER_SITE_OTHER): Payer: Medicare Other

## 2015-11-10 DIAGNOSIS — E538 Deficiency of other specified B group vitamins: Secondary | ICD-10-CM | POA: Diagnosis not present

## 2015-11-10 MED ORDER — CYANOCOBALAMIN 1000 MCG/ML IJ SOLN
1000.0000 ug | Freq: Once | INTRAMUSCULAR | Status: AC
  Administered 2015-11-10: 1000 ug via INTRAMUSCULAR

## 2015-12-15 ENCOUNTER — Ambulatory Visit (INDEPENDENT_AMBULATORY_CARE_PROVIDER_SITE_OTHER): Payer: Medicare Other

## 2015-12-15 DIAGNOSIS — E538 Deficiency of other specified B group vitamins: Secondary | ICD-10-CM | POA: Diagnosis not present

## 2015-12-15 DIAGNOSIS — Z23 Encounter for immunization: Secondary | ICD-10-CM

## 2015-12-15 MED ORDER — CYANOCOBALAMIN 1000 MCG/ML IJ SOLN
1000.0000 ug | Freq: Once | INTRAMUSCULAR | Status: AC
  Administered 2015-12-15: 1000 ug via INTRAMUSCULAR

## 2015-12-15 NOTE — Progress Notes (Signed)
Injection given.   Minta Fair J Jlynn Ly, MD  

## 2015-12-31 DIAGNOSIS — L400 Psoriasis vulgaris: Secondary | ICD-10-CM | POA: Diagnosis not present

## 2015-12-31 DIAGNOSIS — D1801 Hemangioma of skin and subcutaneous tissue: Secondary | ICD-10-CM | POA: Diagnosis not present

## 2015-12-31 DIAGNOSIS — L570 Actinic keratosis: Secondary | ICD-10-CM | POA: Diagnosis not present

## 2015-12-31 DIAGNOSIS — Z85828 Personal history of other malignant neoplasm of skin: Secondary | ICD-10-CM | POA: Diagnosis not present

## 2015-12-31 DIAGNOSIS — L821 Other seborrheic keratosis: Secondary | ICD-10-CM | POA: Diagnosis not present

## 2016-01-14 ENCOUNTER — Ambulatory Visit (INDEPENDENT_AMBULATORY_CARE_PROVIDER_SITE_OTHER): Payer: Medicare Other

## 2016-01-14 DIAGNOSIS — E538 Deficiency of other specified B group vitamins: Secondary | ICD-10-CM

## 2016-01-14 MED ORDER — CYANOCOBALAMIN 1000 MCG/ML IJ SOLN
1000.0000 ug | Freq: Once | INTRAMUSCULAR | Status: AC
  Administered 2016-01-14: 1000 ug via INTRAMUSCULAR

## 2016-01-14 NOTE — Progress Notes (Signed)
Injection given.   Tami Kosiba [name], MD  

## 2016-02-10 ENCOUNTER — Ambulatory Visit (INDEPENDENT_AMBULATORY_CARE_PROVIDER_SITE_OTHER): Payer: Medicare Other | Admitting: Physician Assistant

## 2016-02-10 ENCOUNTER — Other Ambulatory Visit (INDEPENDENT_AMBULATORY_CARE_PROVIDER_SITE_OTHER): Payer: Medicare Other

## 2016-02-10 VITALS — BP 158/62 | HR 70 | Ht 63.0 in | Wt 150.0 lb

## 2016-02-10 DIAGNOSIS — K219 Gastro-esophageal reflux disease without esophagitis: Secondary | ICD-10-CM

## 2016-02-10 DIAGNOSIS — R1013 Epigastric pain: Secondary | ICD-10-CM

## 2016-02-10 LAB — COMPREHENSIVE METABOLIC PANEL
ALBUMIN: 4.1 g/dL (ref 3.5–5.2)
ALK PHOS: 61 U/L (ref 39–117)
ALT: 13 U/L (ref 0–35)
AST: 13 U/L (ref 0–37)
BUN: 12 mg/dL (ref 6–23)
CHLORIDE: 103 meq/L (ref 96–112)
CO2: 31 mEq/L (ref 19–32)
Calcium: 9.6 mg/dL (ref 8.4–10.5)
Creatinine, Ser: 1.12 mg/dL (ref 0.40–1.20)
GFR: 48.99 mL/min — AB (ref >60.00)
Glucose, Bld: 239 mg/dL — ABNORMAL HIGH (ref 70–99)
POTASSIUM: 4.5 meq/L (ref 3.5–5.1)
SODIUM: 138 meq/L (ref 135–145)
Total Bilirubin: 0.6 mg/dL (ref 0.2–1.2)
Total Protein: 6.8 g/dL (ref 6.0–8.3)

## 2016-02-10 LAB — CBC WITH DIFFERENTIAL/PLATELET
BASOS PCT: 0.3 % (ref 0.0–3.0)
Basophils Absolute: 0 10*3/uL (ref 0.0–0.1)
EOS PCT: 2.3 % (ref 0.0–5.0)
Eosinophils Absolute: 0.1 10*3/uL (ref 0.0–0.7)
HCT: 35 % — ABNORMAL LOW (ref 36.0–46.0)
Hemoglobin: 12 g/dL (ref 12.0–15.0)
LYMPHS ABS: 0.9 10*3/uL (ref 0.7–4.0)
Lymphocytes Relative: 21.4 % (ref 12.0–46.0)
MCHC: 34.2 g/dL (ref 30.0–36.0)
MCV: 91.8 fl (ref 78.0–100.0)
MONO ABS: 0.5 10*3/uL (ref 0.1–1.0)
MONOS PCT: 11.3 % (ref 3.0–12.0)
NEUTROS PCT: 64.7 % (ref 43.0–77.0)
Neutro Abs: 2.9 10*3/uL (ref 1.4–7.7)
Platelets: 162 10*3/uL (ref 150.0–400.0)
RBC: 3.81 Mil/uL — ABNORMAL LOW (ref 3.87–5.11)
RDW: 14.6 % (ref 11.5–15.5)
WBC: 4.4 10*3/uL (ref 4.0–10.5)

## 2016-02-10 LAB — LIPASE: LIPASE: 10 U/L — AB (ref 11.0–59.0)

## 2016-02-10 NOTE — Progress Notes (Signed)
Reviewed and agree with documentation and assessment and plan. K. Veena Jedidiah Demartini , MD   

## 2016-02-10 NOTE — Progress Notes (Signed)
**Note De-Identified Tami Obfuscation** Subjective:    Patient ID: Tami Jacobson, female    DOB: [birth year], 82 y.o.   MRN: [id number]  HPI [name] is a very nice 82 year old white female now established with Dr. Silverio Decamp. She was last seen in the office in June 2017. She comes in today with complaints of epigastric pain over the past 2-3 weeks. Patient has history of hypertension, adult-onset diabetes mellitus, breast lumpectomy 2, she is status post appendectomy cholecystectomy and hysterectomy. She last had EGD in 2009 at which time she had a an esophageal stricture dilated per Dr. Olevia Perches. Colonoscopy had been done in 2011 with mild sigmoid diverticulosis and no polyps. It's her current pain as epigastric fairly constant and sharp in nature as well as burning. Some associated heartburn. Nausea or vomiting no dysphagia. She feels somewhat better with food in her stomach. She had a bad episode over the weekend with diarrhea but she attributes that to eating oysters. She states that when her epigastric pain started she stopped Prilosec for reasons that are not clear to me. She also stopped her baby aspirin. She's not been taking any regular NSAIDs. She denies any other new medications changes in vitamin supplements etc. She has had an increase in anxiety over the past 4 months as well.a bitter reflux type sensation and occasional mild dysphagia to solids iReview of Systems Pertinent positive and negative review of systems were noted in the above HPI section.  All other review of systems was otherwise negative.  Outpatient Encounter Prescriptions as of 02/10/2016  Medication Sig  . aspirin EC 81 MG tablet Take 1 tablet (81 mg total) by mouth daily.  . clobetasol cream (TEMOVATE) [id number] % 1 application as needed. Irritated skin  . cyanocobalamin (,VITAMIN B-12,) 1000 MCG/ML injection Inject 1,000 mcg into the muscle every 30 (thirty) days.   . flecainide (TAMBOCOR) 50 MG tablet Take 1 tablet (50 mg total) by mouth 2 (two) times daily.  .  meclizine (ANTIVERT) 25 MG tablet Take 1 tablet (25 mg total) by mouth 3 (three) times daily as needed for dizziness.  . metFORMIN (GLUCOPHAGE-XR) 500 MG 24 hr tablet TAKE ONE TABLET BY MOUTH WITH BREAKFAST  . metoprolol succinate (TOPROL-XL) 50 MG 24 hr tablet TAKE ONE TABLET BY MOUTH ONCE DAILY WITH OR IMMEDIATELY FOLLOWING A MEAL  . omeprazole (PRILOSEC) 20 MG capsule Take 1 capsule (20 mg total) by mouth daily.  . ranitidine (ZANTAC) 150 MG tablet Take 1 tablet (150 mg total) by mouth 2 (two) times daily.  . [DISCONTINUED] hydrocortisone (ANUSOL-HC) 2.5 % rectal cream Place 1 application rectally 2 (two) times daily.  . [DISCONTINUED] Glucose Blood (FREESTYLE LITE TEST VI)   . [DISCONTINUED] glucose blood (FREESTYLE LITE) test strip Use to check blood sugar once daily Dx E11.09  . [DISCONTINUED] Lancets (FREESTYLE) lancets Use to help check blood sugar daily Dx E11.09   No facility-administered encounter medications on file as of 02/10/2016.    Allergies  Allergen Reactions  . Cephalexin   . Codeine   . Lansoprazole   . Levofloxacin   . Nitrofurantoin   . Rofecoxib   . Statins    Patient Active Problem List   Diagnosis Date Noted  . Abnormal electrocardiogram 05/05/2015  . BPPV (benign paroxysmal positional vertigo) 10/01/2014  . Murmur 04/18/2014  . Anal fissure 12/16/2011  . Fibrocystic breast changes. Left.  Biopsy 07/02/2011. 07/14/2011  . History of breast cancer, Right, T1c, N0, lumpectomy 06/26/2008.   Left, T1c, N0, lumpectomy 06/24/2006. 05/12/2011  .  Diabetes mellitus type 2, controlled ([place]) 06/11/2009  . PREMATURE VENTRICULAR CONTRACTIONS 08/19/2008  . B12 deficiency 08/29/2007  . ESOPHAGEAL STRICTURE 04/27/2007  . GERD (gastroesophageal reflux disease) 04/27/2007  . DIVERTICULOSIS, COLON 04/27/2007  . OTHER ALOPECIA 02/14/2007  . HYPERLIPIDEMIA 01/02/2007  . Essential hypertension 01/02/2007  . Carotid stenosis 08/22/2006   Social History   Social History  .  Marital status: Married    Spouse name: N/A  . Number of children: N/A  . Years of education: N/A   Occupational History  . Not on file.   Social History Main Topics  . Smoking status: Never Smoker  . Smokeless tobacco: Never Used  . Alcohol use No  . Drug use: No  . Sexual activity: Not on file   Other Topics Concern  . Not on file   Social History Narrative   Retired Network engineer   Married, lives with spouse    Ms. Detienne's family history includes Breast cancer in her sister, sister, and sister; COPD in her brother; Colon cancer in her mother; Heart attack in her mother; Heart disease in her brother, brother, maternal uncle, and sister.      Objective:    Vitals:   02/10/16 0926  BP: (!) 158/62  Pulse: 70    Physical Exam  well-developed elderly white female in no acute distress, pleasant blood pressure 158/62 pulse 70, Height 5 foot 3 weight 150 BMI 26.5. HEENT; nontraumatic, cephalic EOMI PERRLA sclera anicteric, Cardiovascular r;egular rate and rhythm with S1-S2 no murmur or gallop, Pulmonary; clear bilaterally, Abdomen; soft she has some mild tenderness in the epigastrium there is no guarding or rebound no palpable mass or hepatosplenomegaly bowel sounds are present, Rectal; exam not done, Extremities; no clubbing cyanosis or edema skin warm and dry, Neuropsych; mood and affect appropriate       Assessment & Plan:   #3  82 year old white female with 2-3 week history of epigastric pain, fairly constant sharp and burning in nature. Patient is status post cholecystectomy. Suspect an acute gastritis or possibly peptic ulcer disease #2 diverticulosis #3 remote history of esophageal stricture status post dilation-mild recurrent dysphagia #4 adult-onset diabetes mellitus #5 hypertension #6 B12 deficiency  Plan; due to advanced age would like to avoid procedures if possible. Will schedule for barium swallow and upper GI Restart Prilosec and increase to 20 mg by mouth  twice a day 3 weeks, if better reduce  back to once daily Check CBC , CMET  and lipase today Further plans pending results of above.   Keah Lamba S Aydden Cumpian PA-C 02/10/2016   Cc: Binnie Rail, MD

## 2016-02-10 NOTE — Patient Instructions (Addendum)
Please go to the basement level to have your labs drawn.  Increase the Omeprazole 20 mg to 1 tab twice daily for 3 weeks, if better, then go back to once daily.  You have been scheduled for a Barium Esophogram at [facility], [facility], Radiology on 02-19-2016 at 10:00 am. Please arrive at 9:45 am  to your appointment for registration. Make certain not to have anything to eat or drink after midnight prior to your test. If you need to reschedule for any reason, please contact radiology at [phone] to do so. __________________________________________________________________ A barium swallow is an examination that concentrates on views of the esophagus. This tends to be a double contrast exam (barium and two liquids which, when combined, create a gas to distend the wall of the oesophagus) or single contrast (non-ionic iodine based). The study is usually tailored to your symptoms so a good history is essential. Attention is paid during the study to the form, structure and configuration of the esophagus, looking for functional disorders (such as aspiration, dysphagia, achalasia, motility and reflux) EXAMINATION You may be asked to change into a gown, depending on the type of swallow being performed. A radiologist and radiographer will perform the procedure. The radiologist will advise you of the type of contrast selected for your procedure and direct you during the exam. You will be asked to stand, sit or lie in several different positions and to hold a small amount of fluid in your mouth before being asked to swallow while the imaging is performed .In some instances you may be asked to swallow barium coated marshmallows to assess the motility of a solid food bolus. The exam can be recorded as a digital or video fluoroscopy procedure. POST PROCEDURE It will take 1-2 days for the barium to pass through your system. To facilitate this, it is important, unless otherwise directed, to increase your  fluids for the next 24-48hrs and to resume your normal diet.  This test typically takes about 30 minutes to perform. __________________________________________________________________________________

## 2016-02-16 ENCOUNTER — Ambulatory Visit (INDEPENDENT_AMBULATORY_CARE_PROVIDER_SITE_OTHER): Payer: Medicare Other

## 2016-02-16 DIAGNOSIS — E538 Deficiency of other specified B group vitamins: Secondary | ICD-10-CM

## 2016-02-16 MED ORDER — CYANOCOBALAMIN 1000 MCG/ML IJ SOLN
1000.0000 ug | Freq: Once | INTRAMUSCULAR | Status: AC
  Administered 2016-02-16: 1000 ug via INTRAMUSCULAR

## 2016-02-16 NOTE — Progress Notes (Signed)
Injection given.   Tashara Suder J Irvan Tiedt, MD  

## 2016-02-18 ENCOUNTER — Ambulatory Visit: Payer: Medicare Other | Admitting: Internal Medicine

## 2016-02-19 ENCOUNTER — Inpatient Hospital Stay (HOSPITAL_COMMUNITY): Admission: RE | Admit: 2016-02-19 | Payer: Medicare Other | Source: Ambulatory Visit

## 2016-02-19 ENCOUNTER — Ambulatory Visit (HOSPITAL_COMMUNITY)
Admission: RE | Admit: 2016-02-19 | Discharge: 2016-02-19 | Disposition: A | Discharge status: Home / Self Care | Payer: Medicare Other | Source: Ambulatory Visit | Attending: Physician Assistant | Admitting: Physician Assistant

## 2016-02-19 DIAGNOSIS — K224 Dyskinesia of esophagus: Secondary | ICD-10-CM | POA: Diagnosis not present

## 2016-02-19 DIAGNOSIS — R1013 Epigastric pain: Secondary | ICD-10-CM

## 2016-02-19 DIAGNOSIS — K219 Gastro-esophageal reflux disease without esophagitis: Secondary | ICD-10-CM | POA: Diagnosis not present

## 2016-02-19 DIAGNOSIS — K449 Diaphragmatic hernia without obstruction or gangrene: Secondary | ICD-10-CM | POA: Insufficient documentation

## 2016-02-19 DIAGNOSIS — K222 Esophageal obstruction: Secondary | ICD-10-CM | POA: Insufficient documentation

## 2016-03-03 ENCOUNTER — Telehealth: Payer: Self-pay | Admitting: Physician Assistant

## 2016-03-04 ENCOUNTER — Ambulatory Visit: Payer: Medicare Other | Admitting: Internal Medicine

## 2016-03-17 ENCOUNTER — Ambulatory Visit (INDEPENDENT_AMBULATORY_CARE_PROVIDER_SITE_OTHER): Payer: Medicare Other | Admitting: Geriatric Medicine

## 2016-03-17 ENCOUNTER — Ambulatory Visit: Payer: Medicare Other

## 2016-03-17 DIAGNOSIS — E538 Deficiency of other specified B group vitamins: Secondary | ICD-10-CM

## 2016-03-17 MED ORDER — CYANOCOBALAMIN 1000 MCG/ML IJ SOLN
1000.0000 ug | Freq: Once | INTRAMUSCULAR | Status: AC
  Administered 2016-03-17: 1000 ug via INTRAMUSCULAR

## 2016-03-23 ENCOUNTER — Encounter: Payer: Self-pay | Admitting: Internal Medicine

## 2016-03-23 ENCOUNTER — Other Ambulatory Visit (INDEPENDENT_AMBULATORY_CARE_PROVIDER_SITE_OTHER): Payer: Medicare Other

## 2016-03-23 ENCOUNTER — Ambulatory Visit (INDEPENDENT_AMBULATORY_CARE_PROVIDER_SITE_OTHER): Payer: Medicare Other | Admitting: Internal Medicine

## 2016-03-23 VITALS — BP 162/76 | HR 64 | Temp 97.9°F | Resp 16 | Wt 147.0 lb

## 2016-03-23 DIAGNOSIS — K219 Gastro-esophageal reflux disease without esophagitis: Secondary | ICD-10-CM

## 2016-03-23 DIAGNOSIS — E782 Mixed hyperlipidemia: Secondary | ICD-10-CM

## 2016-03-23 DIAGNOSIS — E119 Type 2 diabetes mellitus without complications: Secondary | ICD-10-CM

## 2016-03-23 DIAGNOSIS — I10 Essential (primary) hypertension: Secondary | ICD-10-CM

## 2016-03-23 DIAGNOSIS — E538 Deficiency of other specified B group vitamins: Secondary | ICD-10-CM

## 2016-03-23 LAB — LIPID PANEL
Cholesterol: 182 mg/dL (ref 0–200)
HDL: 44.2 mg/dL (ref >39.00)
LDL Cholesterol: 112 mg/dL — ABNORMAL HIGH (ref 0–99)
NONHDL: 137.56
Total CHOL/HDL Ratio: 4
Triglycerides: 127 mg/dL (ref 0.0–149.0)
VLDL: 25.4 mg/dL (ref 0.0–40.0)

## 2016-03-23 LAB — COMPREHENSIVE METABOLIC PANEL
ALBUMIN: 4.3 g/dL (ref 3.5–5.2)
ALK PHOS: 54 U/L (ref 39–117)
ALT: 14 U/L (ref 0–35)
AST: 13 U/L (ref 0–37)
BUN: 12 mg/dL (ref 6–23)
CO2: 33 mEq/L — ABNORMAL HIGH (ref 19–32)
Calcium: 10.1 mg/dL (ref 8.4–10.5)
Chloride: 102 mEq/L (ref 96–112)
Creatinine, Ser: 0.84 mg/dL (ref 0.40–1.20)
GFR: 68.26 mL/min (ref >60.00)
Glucose, Bld: 184 mg/dL — ABNORMAL HIGH (ref 70–99)
POTASSIUM: 4.3 meq/L (ref 3.5–5.1)
SODIUM: 141 meq/L (ref 135–145)
TOTAL PROTEIN: 7.1 g/dL (ref 6.0–8.3)
Total Bilirubin: 0.8 mg/dL (ref 0.2–1.2)

## 2016-03-23 LAB — HEMOGLOBIN A1C: HEMOGLOBIN A1C: 6.9 % — AB (ref 4.6–6.5)

## 2016-03-23 MED ORDER — BLOOD GLUCOSE MONITOR KIT: PACK | 0 refills | Status: DC

## 2016-03-23 NOTE — Assessment & Plan Note (Addendum)
Intolerant of statins Check lipid panel Increase regular exercise, improve diet

## 2016-03-23 NOTE — Assessment & Plan Note (Signed)
BP elevated - Related to stress Will monitor for now - may need additional medication Continue metoprolol at current dose for now cmp

## 2016-03-23 NOTE — Assessment & Plan Note (Signed)
Check a1c Improve diet Stay active/exercise Glucometer rx given so she can start monitoring sugar Follow up in 6 months

## 2016-03-23 NOTE — Assessment & Plan Note (Signed)
Getting monthly B12 injections continue

## 2016-03-23 NOTE — Patient Instructions (Addendum)
  Have blood work done today.   All other Health Maintenance issues reviewed.   All recommended immunizations and age-appropriate screenings are up-to-date or discussed.  No immunizations administered today.   Medications reviewed and updated.  No changes recommended at this time.     Please followup in 6 months   

## 2016-03-23 NOTE — Assessment & Plan Note (Addendum)
GERD controlled wit daily omeprazole Continue daily medication

## 2016-03-23 NOTE — Progress Notes (Signed)
Subjective:    Patient ID: Tami Jacobson, female    DOB: Aug 20, 1927, 82 y.o.   MRN: [id number]  HPI The patient is here for follow up.  Her husband left her in June for unknown reasons. Her daughter helped him leave and she has not spoken to her daughter since.  She has had increased stress and feels that is why her BP is elevated today. She is crying intermittently.   Diabetes: She is taking her medication daily as prescribed. She has not been compliant with a diabetic diet. She is not  exercising regularly. She has not been able to monitor her sugars  -  She needs a glucometer.   She is not up-to-date with an ophthalmology examination - scheduled for February.   Hypertension: She is taking her medication daily. She is compliant with a low sodium diet.  She denies chest pain, palpitations, edema, shortness of breath and regular headaches. She not exercising regularly.  She does not monitor her blood pressure at home.    GERD:  She is taking her medication daily as prescribed.  She denies any GERD symptoms and feels her GERD is well controlled.    Medications and allergies reviewed with patient and updated if appropriate.  Patient Active Problem List   Diagnosis Date Noted  . Abnormal electrocardiogram 05/05/2015  . BPPV (benign paroxysmal positional vertigo) 10/01/2014  . Murmur 04/18/2014  . Anal fissure 12/16/2011  . Fibrocystic breast changes. Left.  Biopsy 07/02/2011. 07/14/2011  . History of breast cancer, Right, T1c, N0, lumpectomy 06/26/2008.   Left, T1c, N0, lumpectomy 06/24/2006. 05/12/2011  . Diabetes mellitus type 2, controlled ([place]) 06/11/2009  . PREMATURE VENTRICULAR CONTRACTIONS 08/19/2008  . B12 deficiency 08/29/2007  . ESOPHAGEAL STRICTURE 04/27/2007  . GERD (gastroesophageal reflux disease) 04/27/2007  . DIVERTICULOSIS, COLON 04/27/2007  . OTHER ALOPECIA 02/14/2007  . HYPERLIPIDEMIA 01/02/2007  . Essential hypertension 01/02/2007  . Carotid stenosis 08/22/2006     Current Outpatient Prescriptions on File Prior to Visit  Medication Sig Dispense Refill  . aspirin EC 81 MG tablet Take 1 tablet (81 mg total) by mouth daily. 90 tablet 3  . clobetasol cream (TEMOVATE) [id number] % 1 application as needed. Irritated skin    . cyanocobalamin (,VITAMIN B-12,) 1000 MCG/ML injection Inject 1,000 mcg into the muscle every 30 (thirty) days.     . flecainide (TAMBOCOR) 50 MG tablet Take 1 tablet (50 mg total) by mouth 2 (two) times daily. 60 tablet 12  . meclizine (ANTIVERT) 25 MG tablet Take 1 tablet (25 mg total) by mouth 3 (three) times daily as needed for dizziness. 65 tablet 0  . metFORMIN (GLUCOPHAGE-XR) 500 MG 24 hr tablet TAKE ONE TABLET BY MOUTH WITH BREAKFAST 90 tablet 1  . metoprolol succinate (TOPROL-XL) 50 MG 24 hr tablet TAKE ONE TABLET BY MOUTH ONCE DAILY WITH OR IMMEDIATELY FOLLOWING A MEAL 30 tablet 11  . omeprazole (PRILOSEC) 20 MG capsule Take 1 capsule (20 mg total) by mouth daily. 90 capsule 3   No current facility-administered medications on file prior to visit.     Past Medical History:  Diagnosis Date  . Alopecia   . Anemia   . Arthritis   . Carcinoma in situ of breast    R 06/2006, L 06/2008, L 06/2011 LCIS  . Carotid bruit   . Diabetes mellitus    controlled by diet  . Diverticulosis   . Esophageal stricture   . Gastritis   . GERD (gastroesophageal reflux disease)   .  Hyperlipidemia    intol of all statins  . Hypothyroid   . PAC (premature atrial contraction)   . Premature ventricular contraction   . Vitamin B12 deficiency     Past Surgical History:  Procedure Laterality Date  . ABDOMINAL HYSTERECTOMY  1979  . APPENDECTOMY  2002  . BREAST BIOPSY  07/02/2011   Procedure: BREAST BIOPSY WITH NEEDLE LOCALIZATION;  Surgeon: Shann Medal, MD;  Location: [place];  Service: General;  Laterality: Left;  left breast atypical hyperplasia needle localization biopsy  . BREAST LUMPECTOMY  2008, 2010   X 2  . CATARACT EXTRACTION    .  CHOLECYSTECTOMY  2002  . ESOPHAGEAL DILATION  2006  . TONSILLECTOMY  1942    Social History   Social History  . Marital status: Married    Spouse name: N/A  . Number of children: N/A  . Years of education: N/A   Social History Main Topics  . Smoking status: Never Smoker  . Smokeless tobacco: Never Used  . Alcohol use No  . Drug use: No  . Sexual activity: Not Asked   Other Topics Concern  . None   Social History Narrative   Retired Network engineer   Married, lives with spouse    Family History  Problem Relation Age of Onset  . Colon cancer Mother   . Heart attack Mother   . Breast cancer Sister   . Heart disease Maternal Uncle     CABG  . Breast cancer Sister   . Heart disease Brother   . Anesthesia problems Neg Hx   . Breast cancer Sister   . Heart disease Sister   . COPD Brother   . Heart disease Brother     Review of Systems  Constitutional: Negative for fever.  Respiratory: Negative for cough, shortness of breath and wheezing.   Cardiovascular: Negative for chest pain, palpitations and leg swelling.  Neurological: Positive for dizziness (with quick movements) and headaches (when hungry).       Objective:   Vitals:   03/23/16 0840  BP: (!) 162/76  Pulse: 64  Resp: 16  Temp: 97.9 F (36.6 C)   Filed Weights   03/23/16 0840  Weight: 147 lb (66.7 kg)   Body mass index is 26.04 kg/m.   Physical Exam    Constitutional: Appears well-developed and well-nourished. No distress.  HENT:  Head: Normocephalic and atraumatic.  Neck: Neck supple. No tracheal deviation present. No thyromegaly present.  No cervical lymphadenopathy Cardiovascular: Normal rate, regular rhythm and normal heart sounds.   2/6 systolic  murmur heard. Left carotid bruit .  No edema Pulmonary/Chest: Effort normal and breath sounds normal. No respiratory distress. No has no wheezes. No rales.  Skin: Skin is warm and dry. Not diaphoretic.  Psychiatric: Normal mood and affect. Behavior  is normal.      Assessment & Plan:    See Problem List for Assessment and Plan of chronic medical problems.   F/u in 6 months - DM, htn, GERD and chol

## 2016-03-24 ENCOUNTER — Other Ambulatory Visit: Payer: Self-pay | Admitting: Internal Medicine

## 2016-04-13 DIAGNOSIS — M7062 Trochanteric bursitis, left hip: Secondary | ICD-10-CM | POA: Diagnosis not present

## 2016-04-19 ENCOUNTER — Ambulatory Visit (INDEPENDENT_AMBULATORY_CARE_PROVIDER_SITE_OTHER): Payer: Medicare Other | Admitting: Geriatric Medicine

## 2016-04-19 DIAGNOSIS — E538 Deficiency of other specified B group vitamins: Secondary | ICD-10-CM

## 2016-04-19 DIAGNOSIS — N762 Acute vulvitis: Secondary | ICD-10-CM | POA: Diagnosis not present

## 2016-04-19 DIAGNOSIS — L900 Lichen sclerosus et atrophicus: Secondary | ICD-10-CM | POA: Diagnosis not present

## 2016-04-19 MED ORDER — CYANOCOBALAMIN 1000 MCG/ML IJ SOLN
1000.0000 ug | Freq: Once | INTRAMUSCULAR | Status: AC
  Administered 2016-04-19: 1000 ug via INTRAMUSCULAR

## 2016-04-19 NOTE — Telephone Encounter (Signed)
Done

## 2016-05-05 NOTE — Progress Notes (Signed)
HPI: FU palpitations and cerebrovascular disease; history of PACs and PVCs with normal LV function. She had significant palpitations in the past despite beta-blockade. She is therefore on flecainide 50 mg p.o. b.i.d. A previous catheterization in June 2000 showed normal coronary arteries and normal LV function. Echo 1/16 showed normal LV function, mild LAE. Carotid Dopplers in July of 2016 showed 1-39% bilateral stenosis; fu recommended in one year. Nuclear study February 2017 showed ejection fraction 72% and normal perfusion. Since I last saw her, she denies dyspnea, chest pain, palpitations or syncope.  Current Outpatient Prescriptions  Medication Sig Dispense Refill  . aspirin EC 81 MG tablet Take 1 tablet (81 mg total) by mouth daily. 90 tablet 3  . blood glucose meter kit and supplies KIT Dispense based on patient and insurance preference. Check sugars once daily and as needed as directed. (FOR ICD-9 250.00, 250.01). 1 each 0  . clobetasol cream (TEMOVATE) 7.11 % 1 application as needed. Irritated skin    . cyanocobalamin (,VITAMIN B-12,) 1000 MCG/ML injection Inject 1,000 mcg into the muscle every 30 (thirty) days.     . flecainide (TAMBOCOR) 50 MG tablet Take 1 tablet (50 mg total) by mouth 2 (two) times daily. 60 tablet 12  . meclizine (ANTIVERT) 25 MG tablet Take 1 tablet (25 mg total) by mouth 3 (three) times daily as needed for dizziness. 65 tablet 0  . metFORMIN (GLUCOPHAGE-XR) 500 MG 24 hr tablet TAKE ONE TABLET BY MOUTH ONCE DAILY WITH  BREAKFAST 90 tablet 1  . metoprolol succinate (TOPROL-XL) 50 MG 24 hr tablet TAKE ONE TABLET BY MOUTH ONCE DAILY WITH OR IMMEDIATELY FOLLOWING A MEAL 30 tablet 11  . omeprazole (PRILOSEC) 20 MG capsule Take 1 capsule (20 mg total) by mouth daily. 90 capsule 3   No current facility-administered medications for this visit.      Past Medical History:  Diagnosis Date  . Alopecia   . Anemia   . Arthritis   . Carcinoma in situ of breast    R  06/2006, L 06/2008, L 06/2011 LCIS  . Carotid bruit   . Diabetes mellitus    controlled by diet  . Diverticulosis   . Esophageal stricture   . Gastritis   . GERD (gastroesophageal reflux disease)   . Hyperlipidemia    intol of all statins  . Hypothyroid   . PAC (premature atrial contraction)   . Premature ventricular contraction   . Vitamin B12 deficiency     Past Surgical History:  Procedure Laterality Date  . ABDOMINAL HYSTERECTOMY  1979  . APPENDECTOMY  2002  . BREAST BIOPSY  07/02/2011   Procedure: BREAST BIOPSY WITH NEEDLE LOCALIZATION;  Surgeon: Shann Medal, MD;  Location: [place];  Service: General;  Laterality: Left;  left breast atypical hyperplasia needle localization biopsy  . BREAST LUMPECTOMY  2008, 2010   X 2  . CATARACT EXTRACTION    . CHOLECYSTECTOMY  2002  . ESOPHAGEAL DILATION  2006  . TONSILLECTOMY  1942    Social History   Social History  . Marital status: Legally Separated    Spouse name: Marcello Moores  . Number of children: 1  . Years of education: N/A   Occupational History  . Not on file.   Social History Main Topics  . Smoking status: Never Smoker  . Smokeless tobacco: Never Used  . Alcohol use No  . Drug use: No  . Sexual activity: Not on file   Other Topics  Concern  . Not on file   Social History Narrative   Retired Network engineer   Married, lives with spouse    Family History  Problem Relation Age of Onset  . Colon cancer Mother   . Heart attack Mother   . Breast cancer Sister   . Heart disease Maternal Uncle     CABG  . Breast cancer Sister   . Heart disease Brother   . Breast cancer Sister   . Heart disease Sister   . COPD Brother   . Heart disease Brother   . Anesthesia problems Neg Hx     ROS: no fevers or chills, productive cough, hemoptysis, dysphasia, odynophagia, melena, hematochezia, dysuria, hematuria, rash, seizure activity, orthopnea, PND, pedal edema, claudication. Remaining systems are negative.  Physical  Exam: Well-developed well-nourished in no acute distress.  Skin is warm and dry.  HEENT is normal.  Neck is supple. Bilateral bruits Chest is clear to auscultation with normal expansion.  Cardiovascular exam is regular rate and rhythm. 2/6 systolic murmur left sternal border. No diastolic murmur. S2 is preserved. Abdominal exam nontender or distended. No masses palpated. Extremities show no edema. neuro grossly intact  ECG-sinus rhythm at a rate of 65. Cannot rule out prior inferior infarct.  A/P  1 carotid artery disease-schedule follow-up carotid Dopplers.  2 hypertension-blood pressure elevated. However, she states typically controlled; Continue present medications and follow.  3 palpitations/PVCs-symptoms are reasonably well controlled. Continue flecanide and Toprol.    Tami Jacobson

## 2016-05-11 DIAGNOSIS — H6123 Impacted cerumen, bilateral: Secondary | ICD-10-CM | POA: Diagnosis not present

## 2016-05-11 DIAGNOSIS — G44201 Tension-type headache, unspecified, intractable: Secondary | ICD-10-CM | POA: Diagnosis not present

## 2016-05-11 DIAGNOSIS — J310 Chronic rhinitis: Secondary | ICD-10-CM | POA: Diagnosis not present

## 2016-05-12 ENCOUNTER — Ambulatory Visit (INDEPENDENT_AMBULATORY_CARE_PROVIDER_SITE_OTHER): Payer: Medicare Other | Admitting: Cardiology

## 2016-05-12 ENCOUNTER — Encounter: Payer: Self-pay | Admitting: Cardiology

## 2016-05-12 VITALS — BP 150/68 | HR 65 | Ht 63.0 in | Wt 149.8 lb

## 2016-05-12 DIAGNOSIS — I739 Peripheral vascular disease, unspecified: Secondary | ICD-10-CM

## 2016-05-12 DIAGNOSIS — I779 Disorder of arteries and arterioles, unspecified: Secondary | ICD-10-CM

## 2016-05-12 DIAGNOSIS — I10 Essential (primary) hypertension: Secondary | ICD-10-CM | POA: Diagnosis not present

## 2016-05-12 DIAGNOSIS — R002 Palpitations: Secondary | ICD-10-CM | POA: Diagnosis not present

## 2016-05-12 NOTE — Patient Instructions (Signed)
Medication Instructions:   NO CHANGE  Testing/Procedures:  Your physician has requested that you have a carotid duplex. This test is an ultrasound of the carotid arteries in your neck. It looks at blood flow through these arteries that supply the brain with blood. Allow one hour for this exam. There are no restrictions or special instructions.    Follow-Up:  Your physician wants you to follow-up in: ONE YEAR WITH DR CRENSHAW You will receive a reminder letter in the mail two months in advance. If you don't receive a letter, please call our office to schedule the follow-up appointment.   If you need a refill on your cardiac medications before your next appointment, please call your pharmacy.    

## 2016-05-13 ENCOUNTER — Encounter: Payer: Self-pay | Admitting: Physician Assistant

## 2016-05-13 ENCOUNTER — Ambulatory Visit (INDEPENDENT_AMBULATORY_CARE_PROVIDER_SITE_OTHER): Payer: Medicare Other | Admitting: Physician Assistant

## 2016-05-13 VITALS — BP 128/64 | HR 68 | Ht 63.0 in | Wt 148.0 lb

## 2016-05-13 DIAGNOSIS — L309 Dermatitis, unspecified: Secondary | ICD-10-CM

## 2016-05-13 MED ORDER — FLUCONAZOLE 150 MG PO TABS: ORAL_TABLET | ORAL | 0 refills | Status: DC

## 2016-05-13 MED ORDER — MONISTAT 7 COMPLETE THERAPY 100-2 MG-% VA KIT: PACK | VAGINAL | 1 refills | Status: DC

## 2016-05-13 NOTE — Progress Notes (Signed)
Subjective:    Patient ID: Tami Jacobson, female    DOB: 1927-09-16, 83 y.o.   MRN: [id number]  HPI  Tami Jacobson is a very nice 83 year old white female established with Dr. Silverio Decamp with history of diverticular disease, chronic GERD with history of esophageal stricture, adult-onset diabetes mellitus, BPPV and history of breast cancer 2010. She comes in today stating that she had a viral gastroenteritis last week with 3 days of diarrhea. She has recovered from that. She has been having ongoing problems with irritation, itching and burning around her period anal area. She says this is been worse since having diarrhea last week. She says she has burning and irritation into the labial area as well. She has been using ProctoCream off and on without any benefit. She denies any vaginal drainage, no dysuria.  Review of Systems Pertinent positive and negative review of systems were noted in the above HPI section.  All other review of systems was otherwise negative.  Outpatient Encounter Prescriptions as of 05/13/2016  Medication Sig  . aspirin EC 81 MG tablet Take 1 tablet (81 mg total) by mouth daily.  . blood glucose meter kit and supplies KIT Dispense based on patient and insurance preference. Check sugars once daily and as needed as directed. (FOR ICD-9 250.00, 250.01).  . clobetasol cream (TEMOVATE) 3.24 % 1 application as needed. Irritated skin  . cyanocobalamin (,VITAMIN B-12,) 1000 MCG/ML injection Inject 1,000 mcg into the muscle every 30 (thirty) days.   . flecainide (TAMBOCOR) 50 MG tablet Take 1 tablet (50 mg total) by mouth 2 (two) times daily.  . meclizine (ANTIVERT) 25 MG tablet Take 1 tablet (25 mg total) by mouth 3 (three) times daily as needed for dizziness.  . metFORMIN (GLUCOPHAGE-XR) 500 MG 24 hr tablet TAKE ONE TABLET BY MOUTH ONCE DAILY WITH  BREAKFAST  . metoprolol succinate (TOPROL-XL) 50 MG 24 hr tablet TAKE ONE TABLET BY MOUTH ONCE DAILY WITH OR IMMEDIATELY FOLLOWING A MEAL  .  omeprazole (PRILOSEC) 20 MG capsule Take 1 capsule (20 mg total) by mouth daily.  . fluconazole (DIFLUCAN) 150 MG tablet Take 1 tab and then in 3 days take another tablet.  . Miconazole Nitrate-Wipes (MONISTAT 7 COMPLETE THERAPY) 100-2 MG-% KIT Use 1 application to the affected area 3 times daily for 7 days.   No facility-administered encounter medications on file as of 05/13/2016.    Allergies  Allergen Reactions  . Cephalexin   . Codeine   . Lansoprazole   . Levofloxacin   . Nitrofurantoin   . Rofecoxib   . Statins    Patient Active Problem List   Diagnosis Date Noted  . Abnormal electrocardiogram 05/05/2015  . BPPV (benign paroxysmal positional vertigo) 10/01/2014  . Murmur 04/18/2014  . Anal fissure 12/16/2011  . Fibrocystic breast changes. Left.  Biopsy 07/02/2011. 07/14/2011  . History of breast cancer, Right, T1c, N0, lumpectomy 06/26/2008.   Left, T1c, N0, lumpectomy 06/24/2006. 05/12/2011  . Diabetes mellitus type 2, controlled ([place]) 06/11/2009  . PREMATURE VENTRICULAR CONTRACTIONS 08/19/2008  . B12 deficiency 08/29/2007  . ESOPHAGEAL STRICTURE 04/27/2007  . GERD (gastroesophageal reflux disease) 04/27/2007  . DIVERTICULOSIS, COLON 04/27/2007  . OTHER ALOPECIA 02/14/2007  . HYPERLIPIDEMIA 01/02/2007  . Essential hypertension 01/02/2007  . Carotid stenosis 08/22/2006   Social History   Social History  . Marital status: Legally Separated    Spouse name: Marcello Moores  . Number of children: 1  . Years of education: N/A   Occupational History  . Not  on file.   Social History Main Topics  . Smoking status: Never Smoker  . Smokeless tobacco: Never Used  . Alcohol use No  . Drug use: No  . Sexual activity: Not on file   Other Topics Concern  . Not on file   Social History Narrative   Retired Network engineer   Married, lives with spouse    Tami Jacobson's family history includes Breast cancer in her sister, sister, and sister; COPD in her brother; Colon cancer in her mother;  Heart attack in her mother; Heart disease in her brother, brother, maternal uncle, and sister.      Objective:    Vitals:   05/13/16 1027  BP: 128/64  Pulse: 68    Physical Exam well-developed elderly white female in no acute distress, very pleasant blood pressure 128/64 pulse 68, BMI 26.2. HEENT; nontraumatic normocephalic EOMI PERRLA sclera anicteric, Cardiovascular; regular rate and rhythm with S1-S2 no murmur rub or gallop, Pulmonary ;clear bilaterally, Abdomen ;soft, nontender nondistended bowel sounds are active there is no palpable mass or hepatosplenomegaly, Rectal; exam external only patient has beefy red skin in the perianal, perineal and labial areas, extremities no clubbing cyanosis or edema skin warm dry, Neuropsych; mood and affect appropriate       Assessment & Plan:   #40 83 year old white female with probable perianal/perineal candidiasis #2 GERD with previous history of esophageal stricture/stable #3 diverticulosis #4 diabetes mellitus  #5 history of breast cancer 2010  Plan; Diflucan 150 mg by mouth 1 and repeat in 72 hours Monistat 7-apply 3 times daily 7 days to affected area, may repeat if not completely resolved. Stop ProctoCream Patient is asked to call back in 2-3 weeks if symptoms have not resolved, can try a barrier cream like  Balmex , and consider referral to dermatology  Amy Genia Harold PA-C 05/13/2016   Cc: Binnie Rail, MD

## 2016-05-13 NOTE — Patient Instructions (Addendum)
Stop the Procto.  We sent prescriptions to [facility] [address]  1. Diflucan tablets 2. Monistat 7 kit. - use for 7 days and repeat if needed.   Call us back in 2 weeks with a progress report.

## 2016-05-17 ENCOUNTER — Other Ambulatory Visit: Payer: Self-pay | Admitting: Obstetrics and Gynecology

## 2016-05-17 DIAGNOSIS — Z853 Personal history of malignant neoplasm of breast: Secondary | ICD-10-CM

## 2016-05-17 NOTE — Progress Notes (Signed)
Reviewed and agree with documentation and assessment and plan. K. Veena Arlester Keehan , MD   

## 2016-05-20 ENCOUNTER — Ambulatory Visit (INDEPENDENT_AMBULATORY_CARE_PROVIDER_SITE_OTHER): Payer: Medicare Other

## 2016-05-20 DIAGNOSIS — E538 Deficiency of other specified B group vitamins: Secondary | ICD-10-CM | POA: Diagnosis not present

## 2016-05-20 MED ORDER — CYANOCOBALAMIN 1000 MCG/ML IJ SOLN
1000.0000 ug | Freq: Once | INTRAMUSCULAR | Status: AC
  Administered 2016-05-20: 1000 ug via INTRAMUSCULAR

## 2016-05-20 NOTE — Progress Notes (Signed)
Injection given.   Stacy J Burns, MD  

## 2016-05-24 DIAGNOSIS — H5000 Unspecified esotropia: Secondary | ICD-10-CM | POA: Diagnosis not present

## 2016-05-24 DIAGNOSIS — H26493 Other secondary cataract, bilateral: Secondary | ICD-10-CM | POA: Diagnosis not present

## 2016-05-24 DIAGNOSIS — H524 Presbyopia: Secondary | ICD-10-CM | POA: Diagnosis not present

## 2016-05-24 DIAGNOSIS — E119 Type 2 diabetes mellitus without complications: Secondary | ICD-10-CM | POA: Diagnosis not present

## 2016-05-24 LAB — HM DIABETES EYE EXAM

## 2016-05-25 ENCOUNTER — Telehealth: Payer: Self-pay | Admitting: Physician Assistant

## 2016-05-25 ENCOUNTER — Other Ambulatory Visit: Payer: Self-pay | Admitting: Cardiology

## 2016-05-25 ENCOUNTER — Ambulatory Visit (HOSPITAL_COMMUNITY)
Admission: RE | Admit: 2016-05-25 | Discharge: 2016-05-25 | Disposition: A | Discharge status: Home / Self Care | Payer: Medicare Other | Source: Ambulatory Visit | Attending: Cardiology | Admitting: Cardiology

## 2016-05-25 DIAGNOSIS — I779 Disorder of arteries and arterioles, unspecified: Secondary | ICD-10-CM | POA: Diagnosis not present

## 2016-05-25 DIAGNOSIS — I6523 Occlusion and stenosis of bilateral carotid arteries: Secondary | ICD-10-CM | POA: Diagnosis not present

## 2016-05-25 DIAGNOSIS — I739 Peripheral vascular disease, unspecified: Secondary | ICD-10-CM

## 2016-05-25 NOTE — Telephone Encounter (Signed)
Rx(s) sent to pharmacy electronically.  

## 2016-06-17 ENCOUNTER — Ambulatory Visit (INDEPENDENT_AMBULATORY_CARE_PROVIDER_SITE_OTHER): Payer: Medicare Other | Admitting: General Practice

## 2016-06-17 DIAGNOSIS — E538 Deficiency of other specified B group vitamins: Secondary | ICD-10-CM

## 2016-06-17 MED ORDER — CYANOCOBALAMIN 1000 MCG/ML IJ SOLN
1000.0000 ug | Freq: Once | INTRAMUSCULAR | Status: AC
Start: 2016-06-17 — End: 2016-06-17
  Administered 2016-06-17: 1000 ug via INTRAMUSCULAR

## 2016-06-17 NOTE — Progress Notes (Signed)
Injection given.   Carlin Mamone J Bailie Christenbury, MD  

## 2016-06-25 ENCOUNTER — Other Ambulatory Visit: Payer: Self-pay | Admitting: Cardiology

## 2016-06-28 ENCOUNTER — Other Ambulatory Visit: Payer: Self-pay | Admitting: Obstetrics and Gynecology

## 2016-06-28 ENCOUNTER — Ambulatory Visit
Admission: RE | Admit: 2016-06-28 | Discharge: 2016-06-28 | Disposition: A | Discharge status: Home / Self Care | Payer: Medicare Other | Source: Ambulatory Visit | Attending: Obstetrics and Gynecology | Admitting: Obstetrics and Gynecology

## 2016-06-28 DIAGNOSIS — Z1231 Encounter for screening mammogram for malignant neoplasm of breast: Secondary | ICD-10-CM | POA: Diagnosis not present

## 2016-06-28 DIAGNOSIS — Z853 Personal history of malignant neoplasm of breast: Secondary | ICD-10-CM

## 2016-06-30 DIAGNOSIS — Z01419 Encounter for gynecological examination (general) (routine) without abnormal findings: Secondary | ICD-10-CM | POA: Diagnosis not present

## 2016-06-30 DIAGNOSIS — N762 Acute vulvitis: Secondary | ICD-10-CM | POA: Diagnosis not present

## 2016-06-30 DIAGNOSIS — L900 Lichen sclerosus et atrophicus: Secondary | ICD-10-CM | POA: Diagnosis not present

## 2016-06-30 LAB — HM MAMMOGRAPHY: HM Mammogram: NORMAL (ref 0–4)

## 2016-07-06 NOTE — Progress Notes (Unsigned)
Result Abstracted 

## 2016-07-13 DIAGNOSIS — M7062 Trochanteric bursitis, left hip: Secondary | ICD-10-CM | POA: Diagnosis not present

## 2016-07-15 DIAGNOSIS — H26492 Other secondary cataract, left eye: Secondary | ICD-10-CM | POA: Diagnosis not present

## 2016-07-19 ENCOUNTER — Telehealth: Payer: Self-pay

## 2016-07-19 ENCOUNTER — Ambulatory Visit (INDEPENDENT_AMBULATORY_CARE_PROVIDER_SITE_OTHER): Payer: Medicare Other

## 2016-07-19 DIAGNOSIS — E538 Deficiency of other specified B group vitamins: Secondary | ICD-10-CM

## 2016-07-19 MED ORDER — CYANOCOBALAMIN 1000 MCG/ML IJ SOLN
1000.0000 ug | Freq: Once | INTRAMUSCULAR | Status: AC
  Administered 2016-07-19: 1000 ug via INTRAMUSCULAR

## 2016-07-19 NOTE — Progress Notes (Signed)
Injection given.   Raed Schalk J Jaydynn Wolford, MD  

## 2016-07-19 NOTE — Telephone Encounter (Signed)
She has poor oral absorption - no need to recheck B12 level.  Continue monthly injections

## 2016-07-19 NOTE — Telephone Encounter (Signed)
Routing to dr burns, patient's last b12 lab value was 899 in 2016---please advise if you want patient to continue with b12 injections---I will give injection today and call patient back if you want to recheck lab first before next injection or with any other advisement, thanks

## 2016-07-29 DIAGNOSIS — H26491 Other secondary cataract, right eye: Secondary | ICD-10-CM | POA: Diagnosis not present

## 2016-08-18 ENCOUNTER — Ambulatory Visit (INDEPENDENT_AMBULATORY_CARE_PROVIDER_SITE_OTHER): Payer: Medicare Other

## 2016-08-18 DIAGNOSIS — E538 Deficiency of other specified B group vitamins: Secondary | ICD-10-CM | POA: Diagnosis not present

## 2016-08-18 MED ORDER — CYANOCOBALAMIN 1000 MCG/ML IJ SOLN
1000.0000 ug | Freq: Once | INTRAMUSCULAR | Status: AC
  Administered 2016-08-18: 1000 ug via INTRAMUSCULAR

## 2016-08-18 NOTE — Progress Notes (Signed)
Injection given.   Arva Slaugh J Sherelle Castelli, MD  

## 2016-09-07 ENCOUNTER — Telehealth: Payer: Self-pay | Admitting: Internal Medicine

## 2016-09-07 MED ORDER — BLOOD GLUCOSE MONITOR KIT: PACK | 0 refills | Status: DC

## 2016-09-07 NOTE — Telephone Encounter (Signed)
Pt called in and said that she just rec'd a new meter and would like a refill on her strips and lantas to [facility] on [address]

## 2016-09-07 NOTE — Telephone Encounter (Signed)
Tami Jacobson has sent in this kit and handle this but this pt was to talk to [facility].  She refuse to talk to anyone else.

## 2016-09-08 NOTE — Telephone Encounter (Signed)
Jonelle Sidle is taking care of this.

## 2016-09-15 ENCOUNTER — Encounter: Payer: Self-pay | Admitting: Internal Medicine

## 2016-09-15 ENCOUNTER — Ambulatory Visit (INDEPENDENT_AMBULATORY_CARE_PROVIDER_SITE_OTHER): Payer: Medicare Other | Admitting: Internal Medicine

## 2016-09-15 VITALS — BP 164/94 | HR 69 | Temp 98.1°F | Resp 16 | Ht 63.0 in | Wt 146.0 lb

## 2016-09-15 DIAGNOSIS — N644 Mastodynia: Secondary | ICD-10-CM | POA: Diagnosis not present

## 2016-09-15 MED ORDER — OMEPRAZOLE 20 MG PO CPDR: 20.0000 mg | DELAYED_RELEASE_CAPSULE | Freq: Every day | ORAL | 3 refills | Status: DC

## 2016-09-15 NOTE — Patient Instructions (Signed)
A mammogram and right breast ultrasound has been ordered - someone will call you to schedule this.  Take aleve or tylenol as needed for the pain.

## 2016-09-15 NOTE — Assessment & Plan Note (Signed)
H/o b/l breast cancer now with 4 days of right nipple / areolar tenderness with palpable firmness and pain in area Not muscular in nature Last mammo 3/18 was normal Stat diag mammo and R breast US Tylenol/ aleve for pain

## 2016-09-15 NOTE — Progress Notes (Signed)
Subjective:    Patient ID: Tami Jacobson, female    DOB: September 18, 1927, 83 y.o.   MRN: [id number]  HPI She is here for an acute visit.   Right breast pain:  5 days ago she started having sensitivity in her right breast.  It started after lifting heavy books.  She was moving them.  She denies any sudden pain when moving the books.  She denies palpable lumps, and skin changes.  There is no nipple discharge.  Not touching the breast and wearing a bra helps a little.  She thinks the discomfort has gotten worse - yesterday she was very uncomfortable.  She has not taken anything for the pain.      Her last mammogram was 06/2016 and was normal. She has a history of breast cancer - she had breast cancer in both breasts and is s/p lumpectomy in both breasts.  She was on anastrozole.   Medications and allergies reviewed with patient and updated if appropriate.  Patient Active Problem List   Diagnosis Date Noted  . Abnormal electrocardiogram 05/05/2015  . BPPV (benign paroxysmal positional vertigo) 10/01/2014  . Murmur 04/18/2014  . Anal fissure 12/16/2011  . Fibrocystic breast changes. Left.  Biopsy 07/02/2011. 07/14/2011  . History of breast cancer, Right, T1c, N0, lumpectomy 06/26/2008.   Left, T1c, N0, lumpectomy 06/24/2006. 05/12/2011  . Diabetes mellitus type 2, controlled ([place]) 06/11/2009  . PREMATURE VENTRICULAR CONTRACTIONS 08/19/2008  . B12 deficiency 08/29/2007  . ESOPHAGEAL STRICTURE 04/27/2007  . GERD (gastroesophageal reflux disease) 04/27/2007  . DIVERTICULOSIS, COLON 04/27/2007  . OTHER ALOPECIA 02/14/2007  . HYPERLIPIDEMIA 01/02/2007  . Essential hypertension 01/02/2007  . Carotid stenosis 08/22/2006    Current Outpatient Prescriptions on File Prior to Visit  Medication Sig Dispense Refill  . aspirin EC 81 MG tablet Take 1 tablet (81 mg total) by mouth daily. 90 tablet 3  . blood glucose meter kit and supplies KIT Dispense based on patient and insurance preference. Check  sugars once daily and as needed as directed. 1 each 0  . clobetasol cream (TEMOVATE) 5.99 % 1 application as needed. Irritated skin    . cyanocobalamin (,VITAMIN B-12,) 1000 MCG/ML injection Inject 1,000 mcg into the muscle every 30 (thirty) days.     . flecainide (TAMBOCOR) 50 MG tablet Take 1 tablet (50 mg total) by mouth 2 (two) times daily. 180 tablet 3  . fluconazole (DIFLUCAN) 150 MG tablet Take 1 tab and then in 3 days take another tablet. 2 tablet 0  . meclizine (ANTIVERT) 25 MG tablet Take 1 tablet (25 mg total) by mouth 3 (three) times daily as needed for dizziness. 65 tablet 0  . metFORMIN (GLUCOPHAGE-XR) 500 MG 24 hr tablet TAKE ONE TABLET BY MOUTH ONCE DAILY WITH  BREAKFAST 90 tablet 1  . metoprolol succinate (TOPROL-XL) 50 MG 24 hr tablet TAKE ONE TABLET BY MOUTH ONCE DAILY WITH OR IMMEDIATELY FOLLOWING A MEAL 30 tablet 11  . Miconazole Nitrate-Wipes (MONISTAT 7 COMPLETE THERAPY) 100-2 MG-% KIT Use 1 application to the affected area 3 times daily for 7 days. 1 kit 1  . omeprazole (PRILOSEC) 20 MG capsule Take 1 capsule (20 mg total) by mouth daily. 90 capsule 3   No current facility-administered medications on file prior to visit.     Past Medical History:  Diagnosis Date  . Alopecia   . Anemia   . Arthritis   . Carcinoma in situ of breast    R 06/2006, L 06/2008, L  06/2011 LCIS  . Carotid bruit   . Diabetes mellitus    controlled by diet  . Diverticulosis   . Esophageal stricture   . Gastritis   . GERD (gastroesophageal reflux disease)   . Hyperlipidemia    intol of all statins  . Hypothyroid   . PAC (premature atrial contraction)   . Premature ventricular contraction   . Vitamin B12 deficiency     Past Surgical History:  Procedure Laterality Date  . ABDOMINAL HYSTERECTOMY  1979  . APPENDECTOMY  2002  . BREAST BIOPSY  07/02/2011   Procedure: BREAST BIOPSY WITH NEEDLE LOCALIZATION;  Surgeon: Shann Medal, MD;  Location: [facility];  Service: General;  Laterality: Left;   left breast atypical hyperplasia needle localization biopsy  . BREAST LUMPECTOMY  2008, 2010   X 2  . CATARACT EXTRACTION    . CHOLECYSTECTOMY  2002  . ESOPHAGEAL DILATION  2006  . TONSILLECTOMY  1942    Social History   Social History  . Marital status: Legally Separated    Spouse name: Marcello Moores  . Number of children: 1  . Years of education: N/A   Social History Main Topics  . Smoking status: Never Smoker  . Smokeless tobacco: Never Used  . Alcohol use No  . Drug use: No  . Sexual activity: Not Asked   Other Topics Concern  . None   Social History Narrative   Retired Network engineer   Married, lives with spouse    Family History  Problem Relation Age of Onset  . Colon cancer Mother   . Heart attack Mother   . Breast cancer Sister   . Heart disease Maternal Uncle        CABG  . Breast cancer Sister   . Heart disease Brother   . Breast cancer Sister   . Heart disease Sister   . COPD Brother   . Heart disease Brother   . Anesthesia problems Neg Hx     Review of Systems  Constitutional: Negative for chills and fever.  Skin: Negative for color change, rash and wound.       Objective:   Vitals:   09/15/16 1127  BP: (!) 164/94  Pulse: 69  Resp: 16  Temp: 98.1 F (36.7 C)   Filed Weights   09/15/16 1127  Weight: 146 lb (66.2 kg)   Body mass index is 25.86 kg/m.  Wt Readings from Last 3 Encounters:  09/15/16 146 lb (66.2 kg)  05/13/16 148 lb (67.1 kg)  05/12/16 149 lb 12.8 oz (67.9 kg)     Physical Exam Constitutional: Appears well-developed and well-nourished. No distress.  HENT:  Head: Normocephalic and atraumatic.  BREASTS:  Right breast without skin changes, no nipple discharge, tenderness in areola and nipple with palpable firmness in that area -? Duct; no other masses palpable; no pectoralis tenderness Left breast without skin changes,  No palpable lumps/masses, no tenderness, no axillary lymphadenopathy  Skin: Skin is warm and dry. Not  diaphoretic.           Assessment & Plan:   See Problem List for Assessment and Plan of chronic medical problems.

## 2016-09-17 ENCOUNTER — Ambulatory Visit: Payer: Medicare Other

## 2016-09-19 NOTE — Progress Notes (Signed)
Subjective:    Patient ID: Tami Jacobson, female    DOB: 12/01/1927, 83 y.o.   MRN: [id number]  HPI The patient is here for follow up.   Foot and eye  Hypertension: She is taking her medication daily. She is compliant with a low sodium diet.  She denies chest pain, palpitations, edema, shortness of breath and regular headaches. She is very active during the day, exercising regularly.  She does not monitor her blood pressure at home.    Diabetes: She is taking her medication daily as prescribed. She is compliant with a diabetic diet. She is active during the day, but is not exercising regularly. She monitors her sugars and they have been running 157 today, typically 140-180.   GERD:  She is taking her medication daily as prescribed.  She denies any GERD symptoms and feels her GERD is well controlled.   B12 deficiency:  She is due for a B12 injection today.   She continues to have breast pain.  Her mammogram is scheduled for Thursday.   Medications and allergies reviewed with patient and updated if appropriate.  Patient Active Problem List   Diagnosis Date Noted  . Breast pain, right 09/15/2016  . Abnormal electrocardiogram 05/05/2015  . BPPV (benign paroxysmal positional vertigo) 10/01/2014  . Murmur 04/18/2014  . Anal fissure 12/16/2011  . Fibrocystic breast changes. Left.  Biopsy 07/02/2011. 07/14/2011  . History of breast cancer, Right, T1c, N0, lumpectomy 06/26/2008.   Left, T1c, N0, lumpectomy 06/24/2006. 05/12/2011  . Diabetes mellitus type 2, controlled ([place]) 06/11/2009  . PREMATURE VENTRICULAR CONTRACTIONS 08/19/2008  . B12 deficiency 08/29/2007  . ESOPHAGEAL STRICTURE 04/27/2007  . GERD (gastroesophageal reflux disease) 04/27/2007  . DIVERTICULOSIS, COLON 04/27/2007  . OTHER ALOPECIA 02/14/2007  . HYPERLIPIDEMIA 01/02/2007  . Essential hypertension 01/02/2007  . Carotid stenosis 08/22/2006    Current Outpatient Prescriptions on File Prior to Visit  Medication Sig  Dispense Refill  . aspirin EC 81 MG tablet Take 1 tablet (81 mg total) by mouth daily. 90 tablet 3  . blood glucose meter kit and supplies KIT Dispense based on patient and insurance preference. Check sugars once daily and as needed as directed. 1 each 0  . clobetasol cream (TEMOVATE) 4.09 % 1 application as needed. Irritated skin    . cyanocobalamin (,VITAMIN B-12,) 1000 MCG/ML injection Inject 1,000 mcg into the muscle every 30 (thirty) days.     . flecainide (TAMBOCOR) 50 MG tablet Take 1 tablet (50 mg total) by mouth 2 (two) times daily. 180 tablet 3  . meclizine (ANTIVERT) 25 MG tablet Take 1 tablet (25 mg total) by mouth 3 (three) times daily as needed for dizziness. 65 tablet 0  . metFORMIN (GLUCOPHAGE-XR) 500 MG 24 hr tablet TAKE ONE TABLET BY MOUTH ONCE DAILY WITH  BREAKFAST 90 tablet 1  . metoprolol succinate (TOPROL-XL) 50 MG 24 hr tablet TAKE ONE TABLET BY MOUTH ONCE DAILY WITH OR IMMEDIATELY FOLLOWING A MEAL 30 tablet 11  . omeprazole (PRILOSEC) 20 MG capsule Take 1 capsule (20 mg total) by mouth daily. 90 capsule 3   No current facility-administered medications on file prior to visit.     Past Medical History:  Diagnosis Date  . Alopecia   . Anemia   . Arthritis   . Carcinoma in situ of breast    R 06/2006, L 06/2008, L 06/2011 LCIS  . Carotid bruit   . Diabetes mellitus    controlled by diet  . Diverticulosis   .  Esophageal stricture   . Gastritis   . GERD (gastroesophageal reflux disease)   . Hyperlipidemia    intol of all statins  . Hypothyroid   . PAC (premature atrial contraction)   . Premature ventricular contraction   . Vitamin B12 deficiency     Past Surgical History:  Procedure Laterality Date  . ABDOMINAL HYSTERECTOMY  1979  . APPENDECTOMY  2002  . BREAST BIOPSY  07/02/2011   Procedure: BREAST BIOPSY WITH NEEDLE LOCALIZATION;  Surgeon: Shann Medal, MD;  Location: [facility];  Service: General;  Laterality: Left;  left breast atypical hyperplasia needle  localization biopsy  . BREAST LUMPECTOMY  2008, 2010   X 2  . CATARACT EXTRACTION    . CHOLECYSTECTOMY  2002  . ESOPHAGEAL DILATION  2006  . TONSILLECTOMY  1942    Social History   Social History  . Marital status: Legally Separated    Spouse name: Marcello Moores  . Number of children: 1  . Years of education: N/A   Social History Main Topics  . Smoking status: Never Smoker  . Smokeless tobacco: Never Used  . Alcohol use No  . Drug use: No  . Sexual activity: Not on file   Other Topics Concern  . Not on file   Social History Narrative   Retired Network engineer   Married, lives with spouse    Family History  Problem Relation Age of Onset  . Colon cancer Mother   . Heart attack Mother   . Breast cancer Sister   . Heart disease Maternal Uncle        CABG  . Breast cancer Sister   . Heart disease Brother   . Breast cancer Sister   . Heart disease Sister   . COPD Brother   . Heart disease Brother   . Anesthesia problems Neg Hx     Review of Systems  Constitutional: Negative for chills and fever.  Respiratory: Negative for cough, shortness of breath and wheezing.   Cardiovascular: Positive for palpitations (occasional skipped beat). Negative for chest pain and leg swelling.  Gastrointestinal: Negative for abdominal pain.  Musculoskeletal: Positive for arthralgias (left hip).  Neurological: Positive for light-headedness (mild, sometimes) and headaches.       Objective:   Vitals:   09/21/16 0858  BP: (!) 200/98  Pulse: 78  Resp: 16  Temp: 97.8 F (36.6 C)   Wt Readings from Last 3 Encounters:  09/21/16 144 lb (65.3 kg)  09/15/16 146 lb (66.2 kg)  05/13/16 148 lb (67.1 kg)   Body mass index is 25.51 kg/m.   Physical Exam    Constitutional: Appears well-developed and well-nourished. No distress.  HENT:  Head: Normocephalic and atraumatic.  Neck: Neck supple. No tracheal deviation present. No thyromegaly present.  No cervical lymphadenopathy Cardiovascular:  Normal rate, regular rhythm and normal heart sounds.   2/6 systolic murmur heard. Left carotid bruit .  No edema Pulmonary/Chest: Effort normal and breath sounds normal. No respiratory distress. No has no wheezes. No rales.  Skin: Skin is warm and dry. Not diaphoretic.  Psychiatric: Normal mood and affect. Behavior is normal.     Assessment & Plan:    See Problem List for Assessment and Plan of chronic medical problems.

## 2016-09-19 NOTE — Patient Instructions (Addendum)
  Test(s) ordered today. Your results will be released to [facility] (or called to you) after review, usually within 72hours after test completion. If any changes need to be made, you will be notified at that same time.  All other Health Maintenance issues reviewed.   All recommended immunizations and age-appropriate screenings are up-to-date or discussed.  No immunizations administered today.   A B12 injection was given.   Medications reviewed and updated.  No changes recommended at this time.    Please followup in 6 months

## 2016-09-21 ENCOUNTER — Ambulatory Visit (INDEPENDENT_AMBULATORY_CARE_PROVIDER_SITE_OTHER): Payer: Medicare Other | Admitting: Internal Medicine

## 2016-09-21 ENCOUNTER — Other Ambulatory Visit (INDEPENDENT_AMBULATORY_CARE_PROVIDER_SITE_OTHER): Payer: Medicare Other

## 2016-09-21 ENCOUNTER — Encounter: Payer: Self-pay | Admitting: Internal Medicine

## 2016-09-21 VITALS — BP 200/98 | HR 78 | Temp 97.8°F | Resp 16 | Ht 63.0 in | Wt 144.0 lb

## 2016-09-21 DIAGNOSIS — I10 Essential (primary) hypertension: Secondary | ICD-10-CM | POA: Diagnosis not present

## 2016-09-21 DIAGNOSIS — K219 Gastro-esophageal reflux disease without esophagitis: Secondary | ICD-10-CM | POA: Diagnosis not present

## 2016-09-21 DIAGNOSIS — E538 Deficiency of other specified B group vitamins: Secondary | ICD-10-CM | POA: Diagnosis not present

## 2016-09-21 DIAGNOSIS — E119 Type 2 diabetes mellitus without complications: Secondary | ICD-10-CM

## 2016-09-21 LAB — HEMOGLOBIN A1C: Hgb A1c MFr Bld: 7.1 % — ABNORMAL HIGH (ref 4.6–6.5)

## 2016-09-21 LAB — COMPREHENSIVE METABOLIC PANEL
ALT: 11 U/L (ref 0–35)
AST: 13 U/L (ref 0–37)
Albumin: 4.3 g/dL (ref 3.5–5.2)
Alkaline Phosphatase: 49 U/L (ref 39–117)
BUN: 17 mg/dL (ref 6–23)
CALCIUM: 10.1 mg/dL (ref 8.4–10.5)
CHLORIDE: 103 meq/L (ref 96–112)
CO2: 31 meq/L (ref 19–32)
CREATININE: 0.91 mg/dL (ref 0.40–1.20)
GFR: 62.16 mL/min (ref >60.00)
Glucose, Bld: 182 mg/dL — ABNORMAL HIGH (ref 70–99)
POTASSIUM: 4.8 meq/L (ref 3.5–5.1)
SODIUM: 141 meq/L (ref 135–145)
Total Bilirubin: 0.9 mg/dL (ref 0.2–1.2)
Total Protein: 7 g/dL (ref 6.0–8.3)

## 2016-09-21 LAB — LIPID PANEL
CHOLESTEROL: 185 mg/dL (ref 0–200)
HDL: 42.7 mg/dL (ref >39.00)
LDL CALC: 120 mg/dL — AB (ref 0–99)
NonHDL: 141.82
Total CHOL/HDL Ratio: 4
Triglycerides: 109 mg/dL (ref 0.0–149.0)
VLDL: 21.8 mg/dL (ref 0.0–40.0)

## 2016-09-21 LAB — MICROALBUMIN / CREATININE URINE RATIO
Creatinine,U: 111.6 mg/dL
MICROALB UR: 0.8 mg/dL (ref 0.0–1.9)
Microalb Creat Ratio: 0.7 mg/g (ref 0.0–30.0)

## 2016-09-21 MED ORDER — CYANOCOBALAMIN 1000 MCG/ML IJ SOLN
1000.0000 ug | Freq: Once | INTRAMUSCULAR | Status: AC
  Administered 2016-09-21: 1000 ug via INTRAMUSCULAR

## 2016-09-21 NOTE — Assessment & Plan Note (Signed)
GERD controlled Continue daily medication  

## 2016-09-21 NOTE — Assessment & Plan Note (Addendum)
Check a1c Low sugar / carb diet Stressed regular exercise, keeping weight down  

## 2016-09-21 NOTE — Assessment & Plan Note (Signed)
B12 injection today 

## 2016-09-21 NOTE — Assessment & Plan Note (Signed)
BP elevated - related to breast pain and frustration Repeat a little better Will monitor No change in medication today

## 2016-09-23 ENCOUNTER — Ambulatory Visit
Admission: RE | Admit: 2016-09-23 | Discharge: 2016-09-23 | Disposition: A | Discharge status: Home / Self Care | Payer: Medicare Other | Source: Ambulatory Visit | Attending: Internal Medicine | Admitting: Internal Medicine

## 2016-09-23 ENCOUNTER — Other Ambulatory Visit: Payer: Self-pay | Admitting: Internal Medicine

## 2016-09-23 DIAGNOSIS — R928 Other abnormal and inconclusive findings on diagnostic imaging of breast: Secondary | ICD-10-CM | POA: Diagnosis not present

## 2016-09-23 DIAGNOSIS — N644 Mastodynia: Secondary | ICD-10-CM

## 2016-09-23 DIAGNOSIS — N6489 Other specified disorders of breast: Secondary | ICD-10-CM | POA: Diagnosis not present

## 2016-09-23 DIAGNOSIS — N6310 Unspecified lump in the right breast, unspecified quadrant: Secondary | ICD-10-CM

## 2016-09-23 NOTE — Progress Notes (Signed)
Pre visit review using our clinic review tool, if applicable. No additional management support is needed unless otherwise documented below in the visit note. 

## 2016-09-23 NOTE — Progress Notes (Addendum)
Subjective:   Tami Jacobson is a 83 y.o. female who presents for Medicare Annual (Subsequent) preventive examination.  Review of Systems:  No ROS.  Medicare Wellness Visit. Additional risk factors are reflected in the social history.    Sleep patterns: feels rested on waking, gets up 1 times nightly to void and sleeps 7-8 hours nightly.    Home Safety/Smoke Alarms: Feels safe in home. Smoke alarms in place.  Living environment; residence and Firearm Safety: no firearms. Lives alone, no needs for  DME, has family and friend support. Seat Belt Safety/Bike Helmet: Wears seat belt.   Counseling:   Eye Exam- appointment yearly  Dental- appointment every 6 months    Female:   Pap- N/A      Mammo- Last 09/23/16, show mass, Has biopsy scheduled for 09/27/16   Dexa scan- Last 08/10/10, osteopenia, declines referral today     CCS- N/A     Objective:     Vitals: There were no vitals taken for this visit.  There is no height or weight on file to calculate BMI.   Tobacco History  Smoking Status  . Never Smoker  Smokeless Tobacco  . Never Used     Counseling given: Not Answered   Past Medical History:  Diagnosis Date  . Alopecia   . Anemia   . Arthritis   . Carcinoma in situ of breast    R 06/2006, L 06/2008, L 06/2011 LCIS  . Carotid bruit   . Diabetes mellitus    controlled by diet  . Diverticulosis   . Esophageal stricture   . Gastritis   . GERD (gastroesophageal reflux disease)   . Hyperlipidemia    intol of all statins  . Hypothyroid   . PAC (premature atrial contraction)   . Premature ventricular contraction   . Vitamin B12 deficiency    Past Surgical History:  Procedure Laterality Date  . ABDOMINAL HYSTERECTOMY  1979  . APPENDECTOMY  2002  . BREAST BIOPSY  07/02/2011   Procedure: BREAST BIOPSY WITH NEEDLE LOCALIZATION;  Surgeon: Shann Medal, MD;  Location: [facility];  Service: General;  Laterality: Left;  left breast atypical hyperplasia needle localization  biopsy  . BREAST LUMPECTOMY  2008, 2010   X 2  . CATARACT EXTRACTION    . CHOLECYSTECTOMY  2002  . ESOPHAGEAL DILATION  2006  . TONSILLECTOMY  1942   Family History  Problem Relation Age of Onset  . Colon cancer Mother   . Heart attack Mother   . Breast cancer Sister   . Heart disease Maternal Uncle        CABG  . Breast cancer Sister   . Heart disease Brother   . Breast cancer Sister   . Heart disease Sister   . COPD Brother   . Heart disease Brother   . Anesthesia problems Neg Hx    History  Sexual Activity  . Sexual activity: Not on file    Outpatient Encounter Prescriptions as of 09/27/2016  Medication Sig  . aspirin EC 81 MG tablet Take 1 tablet (81 mg total) by mouth daily.  . blood glucose meter kit and supplies KIT Dispense based on patient and insurance preference. Check sugars once daily and as needed as directed.  . clobetasol cream (TEMOVATE) 4.40 % 1 application as needed. Irritated skin  . cyanocobalamin (,VITAMIN B-12,) 1000 MCG/ML injection Inject 1,000 mcg into the muscle every 30 (thirty) days.   . flecainide (TAMBOCOR) 50 MG tablet Take  1 tablet (50 mg total) by mouth 2 (two) times daily.  . meclizine (ANTIVERT) 25 MG tablet Take 1 tablet (25 mg total) by mouth 3 (three) times daily as needed for dizziness.  . metFORMIN (GLUCOPHAGE-XR) 500 MG 24 hr tablet TAKE ONE TABLET BY MOUTH ONCE DAILY WITH  BREAKFAST  . metoprolol succinate (TOPROL-XL) 50 MG 24 hr tablet TAKE ONE TABLET BY MOUTH ONCE DAILY WITH OR IMMEDIATELY FOLLOWING A MEAL  . omeprazole (PRILOSEC) 20 MG capsule Take 1 capsule (20 mg total) by mouth daily.   No facility-administered encounter medications on file as of 09/27/2016.     Activities of Daily Living No flowsheet data found.  Patient Care Team: Binnie Rail, MD as PCP - General (Internal Medicine) Stanford Breed Denice Bors, MD as Consulting Physician (Cardiology) Magrinat, Virgie Dad, MD as Consulting Physician (Hematology and  Oncology) Lafayette Dragon, MD (Inactive) as Consulting Physician (Gastroenterology) Newton Pigg, MD as Consulting Physician (Obstetrics and Gynecology) Orion Crook, MD (Inactive) as Consulting Physician (Urology) Alphonsa Overall, MD as Consulting Physician (General Surgery) Rozetta Nunnery, MD as Consulting Physician (Otolaryngology)    Assessment:    Physical assessment deferred to PCP.  Exercise Activities and Dietary recommendations   Diet (meal preparation, eat out, water intake, caffeinated beverages, dairy products, fruits and vegetables): in general, a "healthy" diet  , well balanced, diabetic, low fat/ cholesterol, low salt  Drinks 2-3 glassed of water per day  Reviewed heart healthy and diabetic diet, encouraged patient to increase daily water intake. Diet education was attached to patient's AVS.  Goals    None     Fall Risk Fall Risk  09/15/2016 08/20/2015 07/02/2014 06/26/2013 06/14/2012  Falls in the past year? No No No No No   Depression Screen PHQ 2/9 Scores 09/15/2016 08/20/2015 07/02/2014 06/26/2013  PHQ - 2 Score 1 0 0 0     Cognitive Function       Ad8 score reviewed for issues:  Issues making decisions: no  Less interest in hobbies / activities: no  Repeats questions, stories (family complaining): no  Trouble using ordinary gadgets (microwave, computer, phone):no  Forgets the month or year: no  Mismanaging finances: no  Remembering appts: no  Daily problems with thinking and/or memory: no Ad8 score is= 0  Immunization History  Administered Date(s) Administered  . Influenza Split 12/14/2011  . Influenza Whole 02/01/2007, 01/03/2009, 12/15/2010  . Influenza, High Dose Seasonal PF 01/01/2014, 12/25/2014, 12/15/2015  . Influenza,inj,Quad PF,36+ Mos 12/26/2012  . Pneumococcal Conjugate-13 07/02/2014  . Pneumococcal Polysaccharide-23 02/16/2002, 02/09/2012  . Td 06/10/2009   Screening Tests Health Maintenance  Topic Date Due  .  OPHTHALMOLOGY EXAM  03/19/2015  . FOOT EXAM  07/02/2015  . INFLUENZA VACCINE  11/03/2016  . HEMOGLOBIN A1C  03/23/2017  . URINE MICROALBUMIN  09/21/2017  . TETANUS/TDAP  06/11/2019  . DEXA SCAN  Completed  . PNA vac Low Risk Adult  Completed      Plan:  Continue to eat heart healthy diet (full of fruits, vegetables, whole grains, lean protein, water--limit salt, fat, and sugar intake) and increase physical activity as tolerated.  Continue doing brain stimulating activities (puzzles, reading, adult coloring books, staying active) to keep memory sharp.    I have personally reviewed and noted the following in the patient's chart:   . Medical and social history . Use of alcohol, tobacco or illicit drugs  . Current medications and supplements . Functional ability and status . Nutritional status . Physical activity .  Advanced directives . List of other physicians . Vitals . Screenings to include cognitive, depression, and falls . Referrals and appointments  In addition, I have reviewed and discussed with patient certain preventive protocols, quality metrics, and best practice recommendations. A written personalized care plan for preventive services as well as general preventive health recommendations were provided to patient.     Michiel Cowboy, RN  09/23/2016   Medical screening examination/treatment/procedure(s) were performed by non-physician practitioner and as supervising physician I was immediately available for consultation/collaboration. I agree with above. Binnie Rail, MD

## 2016-09-27 ENCOUNTER — Ambulatory Visit (INDEPENDENT_AMBULATORY_CARE_PROVIDER_SITE_OTHER): Payer: Medicare Other | Admitting: *Deleted

## 2016-09-27 ENCOUNTER — Ambulatory Visit
Admission: RE | Admit: 2016-09-27 | Discharge: 2016-09-27 | Disposition: A | Discharge status: Home / Self Care | Payer: Medicare Other | Source: Ambulatory Visit | Attending: Internal Medicine | Admitting: Internal Medicine

## 2016-09-27 ENCOUNTER — Other Ambulatory Visit: Payer: Self-pay | Admitting: Internal Medicine

## 2016-09-27 VITALS — BP 128/56 | HR 61 | Resp 20 | Ht 63.0 in | Wt 144.0 lb

## 2016-09-27 DIAGNOSIS — N6314 Unspecified lump in the right breast, lower inner quadrant: Secondary | ICD-10-CM | POA: Diagnosis not present

## 2016-09-27 DIAGNOSIS — N6310 Unspecified lump in the right breast, unspecified quadrant: Secondary | ICD-10-CM

## 2016-09-27 DIAGNOSIS — Z0000 Encounter for general adult medical examination without abnormal findings: Secondary | ICD-10-CM | POA: Diagnosis not present

## 2016-09-27 DIAGNOSIS — C50311 Malignant neoplasm of lower-inner quadrant of right female breast: Secondary | ICD-10-CM | POA: Diagnosis not present

## 2016-09-27 HISTORY — PX: BREAST BIOPSY: SHX20

## 2016-09-27 HISTORY — PX: BREAST LUMPECTOMY: SHX2

## 2016-09-27 NOTE — Patient Instructions (Signed)
Continue to eat heart healthy diet (full of fruits, vegetables, whole grains, lean protein, water--limit salt, fat, and sugar intake) and increase physical activity as tolerated.  Continue doing brain stimulating activities (puzzles, reading, adult coloring books, staying active) to keep memory sharp.    Tami Jacobson , Thank you for taking time to come for your Medicare Wellness Visit. I appreciate your ongoing commitment to your health goals. Please review the following plan we discussed and let me know if I can assist you in the future.   These are the goals we discussed: Goals    . Maintian current health status          Continue to eat healthy, stay active and independent as much as possible       This is a list of the screening recommended for you and due dates:  Health Maintenance  Topic Date Due  . Eye exam for diabetics  03/19/2015  . Complete foot exam   07/02/2015  . Flu Shot  11/03/2016  . Hemoglobin A1C  03/23/2017  . Urine Protein Check  09/21/2017  . Tetanus Vaccine  06/11/2019  . DEXA scan (bone density measurement)  Completed  . Pneumonia vaccines  Completed    Carbohydrate Counting for Diabetes Mellitus, Adult Carbohydrate counting is a method for keeping track of how many carbohydrates you eat. Eating carbohydrates naturally increases the amount of sugar (glucose) in the blood. Counting how many carbohydrates you eat helps keep your blood glucose within normal limits, which helps you manage your diabetes (diabetes mellitus). It is important to know how many carbohydrates you can safely have in each meal. This is different for every person. A diet and nutrition specialist (registered dietitian) can help you make a meal plan and calculate how many carbohydrates you should have at each meal and snack. Carbohydrates are found in the following foods:  Grains, such as breads and cereals.  Dried beans and soy products.  Starchy vegetables, such as potatoes, peas, and  corn.  Fruit and fruit juices.  Milk and yogurt.  Sweets and snack foods, such as cake, cookies, candy, chips, and soft drinks.  How do I count carbohydrates? There are two ways to count carbohydrates in food. You can use either of the methods or a combination of both. Reading "Nutrition Facts" on packaged food The "Nutrition Facts" list is included on the labels of almost all packaged foods and beverages in the [place] It includes:  The serving size.  Information about nutrients in each serving, including the grams (g) of carbohydrate per serving.  To use the "Nutrition Facts":  Decide how many servings you will have.  Multiply the number of servings by the number of carbohydrates per serving.  The resulting number is the total amount of carbohydrates that you will be having.  Learning standard serving sizes of other foods When you eat foods containing carbohydrates that are not packaged or do not include "Nutrition Facts" on the label, you need to measure the servings in order to count the amount of carbohydrates:  Measure the foods that you will eat with a food scale or measuring cup, if needed.  Decide how many standard-size servings you will eat.  Multiply the number of servings by 15. Most carbohydrate-rich foods have about 15 g of carbohydrates per serving. ? For example, if you eat 8 oz (170 g) of strawberries, you will have eaten 2 servings and 30 g of carbohydrates (2 servings x 15 g = 30 g).  For foods that have more than one food mixed, such as soups and casseroles, you must count the carbohydrates in each food that is included.  The following list contains standard serving sizes of common carbohydrate-rich foods. Each of these servings has about 15 g of carbohydrates:   hamburger bun or  English muffin.   oz (15 mL) syrup.   oz (14 g) jelly.  1 slice of bread.  1 six-inch tortilla.  3 oz (85 g) cooked rice or pasta.  4 oz (113 g) cooked dried  beans.  4 oz (113 g) starchy vegetable, such as peas, corn, or potatoes.  4 oz (113 g) hot cereal.  4 oz (113 g) mashed potatoes or  of a large baked potato.  4 oz (113 g) canned or frozen fruit.  4 oz (120 mL) fruit juice.  4-6 crackers.  6 chicken nuggets.  6 oz (170 g) unsweetened dry cereal.  6 oz (170 g) plain fat-free yogurt or yogurt sweetened with artificial sweeteners.  8 oz (240 mL) milk.  8 oz (170 g) fresh fruit or one small piece of fruit.  24 oz (680 g) popped popcorn.  Example of carbohydrate counting Sample meal  3 oz (85 g) chicken breast.  6 oz (170 g) brown rice.  4 oz (113 g) corn.  8 oz (240 mL) milk.  8 oz (170 g) strawberries with sugar-free whipped topping. Carbohydrate calculation 1. Identify the foods that contain carbohydrates: ? Rice. ? Corn. ? Milk. ? Strawberries. 2. Calculate how many servings you have of each food: ? 2 servings rice. ? 1 serving corn. ? 1 serving milk. ? 1 serving strawberries. 3. Multiply each number of servings by 15 g: ? 2 servings rice x 15 g = 30 g. ? 1 serving corn x 15 g = 15 g. ? 1 serving milk x 15 g = 15 g. ? 1 serving strawberries x 15 g = 15 g. 4. Add together all of the amounts to find the total grams of carbohydrates eaten: ? 30 g + 15 g + 15 g + 15 g = 75 g of carbohydrates total. This information is not intended to replace advice given to you by your health care provider. Make sure you discuss any questions you have with your health care provider. Document Released: 03/22/2005 Document Revised: 10/10/2015 Document Reviewed: 09/03/2015 Elsevier Interactive Patient Education  2018 [employer]. Diabetes Mellitus and Food It is important for you to manage your blood sugar (glucose) level. Your blood glucose level can be greatly affected by what you eat. Eating healthier foods in the appropriate amounts throughout the day at about the same time each day will help you control your blood glucose  level. It can also help slow or prevent worsening of your diabetes mellitus. Healthy eating may even help you improve the level of your blood pressure and reach or maintain a healthy weight. General recommendations for healthful eating and cooking habits include:  Eating meals and snacks regularly. Avoid going long periods of time without eating to lose weight.  Eating a diet that consists mainly of plant-based foods, such as fruits, vegetables, nuts, legumes, and whole grains.  Using low-heat cooking methods, such as baking, instead of high-heat cooking methods, such as deep frying.  Work with your dietitian to make sure you understand how to use the Nutrition Facts information on food labels. How can food affect me? Carbohydrates Carbohydrates affect your blood glucose level more than any other type of food.  Your dietitian will help you determine how many carbohydrates to eat at each meal and teach you how to count carbohydrates. Counting carbohydrates is important to keep your blood glucose at a healthy level, especially if you are using insulin or taking certain medicines for diabetes mellitus. Alcohol Alcohol can cause sudden decreases in blood glucose (hypoglycemia), especially if you use insulin or take certain medicines for diabetes mellitus. Hypoglycemia can be a life-threatening condition. Symptoms of hypoglycemia (sleepiness, dizziness, and disorientation) are similar to symptoms of having too much alcohol. If your health care provider has given you approval to drink alcohol, do so in moderation and use the following guidelines:  Women should not have more than one drink per day, and men should not have more than two drinks per day. One drink is equal to: ? 12 oz of beer. ? 5 oz of [place]manuel [place]. ? 1 oz of hard liquor.  Do not drink on an empty stomach.  Keep yourself hydrated. Have water, diet soda, or unsweetened iced tea.  Regular soda, juice, and other mixers might contain a lot of  carbohydrates and should be counted.  What foods are not recommended? As you make food choices, it is important to remember that all foods are not the same. Some foods have fewer nutrients per serving than other foods, even though they might have the same number of calories or carbohydrates. It is difficult to get your body what it needs when you eat foods with fewer nutrients. Examples of foods that you should avoid that are high in calories and carbohydrates but low in nutrients include:  Trans fats (most processed foods list trans fats on the Nutrition Facts label).  Regular soda.  Juice.  Candy.  Sweets, such as cake, pie, doughnuts, and cookies.  Fried foods.  What foods can I eat? Eat nutrient-rich foods, which will nourish your body and keep you healthy. The food you should eat also will depend on several factors, including:  The calories you need.  The medicines you take.  Your weight.  Your blood glucose level.  Your blood pressure level.  Your cholesterol level.  You should eat a variety of foods, including:  Protein. ? Lean cuts of meat. ? Proteins low in saturated fats, such as fish, egg whites, and beans. Avoid processed meats.  Fruits and vegetables. ? Fruits and vegetables that may help control blood glucose levels, such as apples, mangoes, and yams.  Dairy products. ? Choose fat-free or low-fat dairy products, such as milk, yogurt, and cheese.  Grains, bread, pasta, and rice. ? Choose whole grain products, such as multigrain bread, whole oats, and brown rice. These foods may help control blood pressure.  Fats. ? Foods containing healthful fats, such as nuts, avocado, olive oil, canola oil, and fish.  Does everyone with diabetes mellitus have the same meal plan? Because every person with diabetes mellitus is different, there is not one meal plan that works for everyone. It is very important that you meet with a dietitian who will help you create a  meal plan that is just right for you. This information is not intended to replace advice given to you by your health care provider. Make sure you discuss any questions you have with your health care provider. Document Released: 12/17/2004 Document Revised: 08/28/2015 Document Reviewed: 02/16/2013 Elsevier Interactive Patient Education  2017 [employer].

## 2016-10-13 DIAGNOSIS — M7062 Trochanteric bursitis, left hip: Secondary | ICD-10-CM | POA: Diagnosis not present

## 2016-10-19 DIAGNOSIS — Z170 Estrogen receptor positive status [ER+]: Secondary | ICD-10-CM | POA: Diagnosis not present

## 2016-10-19 DIAGNOSIS — C50911 Malignant neoplasm of unspecified site of right female breast: Secondary | ICD-10-CM | POA: Diagnosis not present

## 2016-10-21 ENCOUNTER — Encounter: Payer: Self-pay | Admitting: Radiation Oncology

## 2016-10-21 ENCOUNTER — Ambulatory Visit (INDEPENDENT_AMBULATORY_CARE_PROVIDER_SITE_OTHER): Payer: Medicare Other | Admitting: General Practice

## 2016-10-21 ENCOUNTER — Encounter: Payer: Self-pay | Admitting: Oncology

## 2016-10-21 DIAGNOSIS — E538 Deficiency of other specified B group vitamins: Secondary | ICD-10-CM | POA: Diagnosis not present

## 2016-10-21 MED ORDER — CYANOCOBALAMIN 1000 MCG/ML IJ SOLN
1000.0000 ug | Freq: Once | INTRAMUSCULAR | Status: AC
  Administered 2016-10-21: 1000 ug via INTRAMUSCULAR

## 2016-10-21 NOTE — Progress Notes (Signed)
Injection given.   [place] Hanlon J Sierria Bruney, MD  

## 2016-10-25 NOTE — Progress Notes (Signed)
Location of Breast Cancer: Right Breast Lower Inner Quadrant  Histology per Pathology Report: Diagnosis : 09/27/2016: Breast, right, needle core biopsy, 5 o'clock, 2 cmfn - INVASIVE MAMMARY [facility].   Receptor Status: ER(80%+), PR (5%+), Her2-neu (neg. Ratio 1.21), Ki-67(5%)  Did patient present with symptoms (if so, please note symptoms) or was this found on screening mammography?: routine mammogram in March, went back in June pain in nipple for 2 weeks,   Past/Anticipated interventions by surgeon, if any:Dr. Misty Stanley 10/20/16 Past/Anticipated interventions by medical oncology, if any: Chemotherapy : Dr. Jana Hakim, last note was 4/17/215, was treated with Arimidex 07/2008-07/2011,then started Tamoxifen  07/2011-07/2013, appt 7/26+/18 at 3pm today  Lymphedema issues, if any: NO  Pain issues, if any: hip left   SAFETY ISSUES: NO  Prior radiation? No  Pacemaker/ICD? NO  Is the patient on methotrexate? NO  Current Complaints / other details: Married, Gx P1;  Hx Left breast  bx,fibrocystic 07/02/11, Right breast lumpectomy T1c,NO, Left breast ,3//24/2010,  Right lumpectomy 06/24/2006 , Legally separated Mother MI<Colon cancer,, Sisters breat cancer, Maternal Uncle Heart disease,Brothrs heart disease,     Tami Eaton, RN 10/25/2016,3:12 PM  BP (!) 138/43 Comment: LFA sitting  Pulse 84   Temp 98 F (36.7 C) (Oral)   Resp 18   Ht '5\' 3"'  (1.6 m)   Wt 141 lb 12.8 oz (64.3 kg)   BMI 25.12 kg/m   Wt Readings from Last 3 Encounters:  10/28/16 141 lb 12.8 oz (64.3 kg)  09/27/16 144 lb (65.3 kg)  09/21/16 144 lb (65.3 kg)

## 2016-10-28 ENCOUNTER — Ambulatory Visit
Admission: RE | Admit: 2016-10-28 | Discharge: 2016-10-28 | Disposition: A | Discharge status: Home / Self Care | Payer: Medicare Other | Source: Ambulatory Visit | Attending: Radiation Oncology | Admitting: Radiation Oncology

## 2016-10-28 ENCOUNTER — Ambulatory Visit (HOSPITAL_BASED_OUTPATIENT_CLINIC_OR_DEPARTMENT_OTHER): Payer: Medicare Other | Admitting: Oncology

## 2016-10-28 VITALS — BP 138/43 | HR 84 | Temp 98.0°F | Resp 18 | Ht 63.0 in | Wt 141.8 lb

## 2016-10-28 DIAGNOSIS — Z170 Estrogen receptor positive status [ER+]: Secondary | ICD-10-CM | POA: Diagnosis not present

## 2016-10-28 DIAGNOSIS — Z79899 Other long term (current) drug therapy: Secondary | ICD-10-CM | POA: Diagnosis not present

## 2016-10-28 DIAGNOSIS — Z881 Allergy status to other antibiotic agents status: Secondary | ICD-10-CM | POA: Insufficient documentation

## 2016-10-28 DIAGNOSIS — Z91040 Latex allergy status: Secondary | ICD-10-CM | POA: Insufficient documentation

## 2016-10-28 DIAGNOSIS — Z86000 Personal history of in-situ neoplasm of breast: Secondary | ICD-10-CM | POA: Insufficient documentation

## 2016-10-28 DIAGNOSIS — Z510 Encounter for antineoplastic radiation therapy: Secondary | ICD-10-CM | POA: Insufficient documentation

## 2016-10-28 DIAGNOSIS — K219 Gastro-esophageal reflux disease without esophagitis: Secondary | ICD-10-CM | POA: Insufficient documentation

## 2016-10-28 DIAGNOSIS — E039 Hypothyroidism, unspecified: Secondary | ICD-10-CM | POA: Diagnosis not present

## 2016-10-28 DIAGNOSIS — Z7982 Long term (current) use of aspirin: Secondary | ICD-10-CM | POA: Insufficient documentation

## 2016-10-28 DIAGNOSIS — E538 Deficiency of other specified B group vitamins: Secondary | ICD-10-CM | POA: Diagnosis not present

## 2016-10-28 DIAGNOSIS — Z7984 Long term (current) use of oral hypoglycemic drugs: Secondary | ICD-10-CM | POA: Diagnosis not present

## 2016-10-28 DIAGNOSIS — Z90710 Acquired absence of both cervix and uterus: Secondary | ICD-10-CM | POA: Insufficient documentation

## 2016-10-28 DIAGNOSIS — C50311 Malignant neoplasm of lower-inner quadrant of right female breast: Secondary | ICD-10-CM | POA: Diagnosis not present

## 2016-10-28 DIAGNOSIS — Z803 Family history of malignant neoplasm of breast: Secondary | ICD-10-CM | POA: Insufficient documentation

## 2016-10-28 DIAGNOSIS — Z825 Family history of asthma and other chronic lower respiratory diseases: Secondary | ICD-10-CM | POA: Diagnosis not present

## 2016-10-28 DIAGNOSIS — Z853 Personal history of malignant neoplasm of breast: Secondary | ICD-10-CM | POA: Diagnosis not present

## 2016-10-28 DIAGNOSIS — Z885 Allergy status to narcotic agent status: Secondary | ICD-10-CM | POA: Diagnosis not present

## 2016-10-28 DIAGNOSIS — M1990 Unspecified osteoarthritis, unspecified site: Secondary | ICD-10-CM | POA: Diagnosis not present

## 2016-10-28 DIAGNOSIS — Z800 Family history of malignant neoplasm of digestive organs: Secondary | ICD-10-CM | POA: Diagnosis not present

## 2016-10-28 DIAGNOSIS — C50812 Malignant neoplasm of overlapping sites of left female breast: Secondary | ICD-10-CM | POA: Insufficient documentation

## 2016-10-28 DIAGNOSIS — Z8249 Family history of ischemic heart disease and other diseases of the circulatory system: Secondary | ICD-10-CM | POA: Insufficient documentation

## 2016-10-28 DIAGNOSIS — E119 Type 2 diabetes mellitus without complications: Secondary | ICD-10-CM | POA: Insufficient documentation

## 2016-10-28 DIAGNOSIS — Z888 Allergy status to other drugs, medicaments and biological substances status: Secondary | ICD-10-CM | POA: Diagnosis not present

## 2016-10-28 DIAGNOSIS — E785 Hyperlipidemia, unspecified: Secondary | ICD-10-CM | POA: Insufficient documentation

## 2016-10-28 MED ORDER — TAMOXIFEN CITRATE 20 MG PO TABS: 20.0000 mg | ORAL_TABLET | Freq: Every day | ORAL | 12 refills | Status: AC

## 2016-10-28 NOTE — Progress Notes (Signed)
Tami Jacobson  Telephone:(336) [phone] [phone]     ID: AHNI BRADWELL DOB: 1927-11-27  MR#: [id number]  XYV#:[id number]  Patient Care Team: Binnie Rail, MD as PCP - General (Internal Medicine) Stanford Breed Denice Bors, MD as Consulting Physician (Cardiology) Magrinat, Virgie Dad, MD as Consulting Physician (Hematology and Oncology) Lafayette Dragon, MD (Inactive) as Consulting Physician (Gastroenterology) Newton Pigg, MD as Consulting Physician (Obstetrics and Gynecology) Orion Crook, MD (Inactive) as Consulting Physician (Urology) Alphonsa Overall, MD as Consulting Physician (General Surgery) Rozetta Nunnery, MD as Consulting Physician (Otolaryngology) Kyung Rudd, MD as Consulting Physician (Radiation Oncology) Chauncey Cruel, MD OTHER MD:  CHIEF COMPLAINT: Estrogen receptor positive breast cancer  CURRENT TREATMENT: Awaiting definitive surgery   BREAST CANCER HISTORY: The patient had bilateral screening mammography with tomography at the [facility] 06/28/2016 showing only post surgical changes in both breasts. The patient had a history of right lateral breast cancer diagnosed in 2010, T1 cN0, status post lumpectomy March of that year, and she also has a history of T1 CN 0 left breast cancer status post lumpectomy in 2008. She had excisional biopsy on the left of what proved to be atypical lobular hyperplasia March 2013.  On 09/23/2016 the patient was evaluated for pain in the inferior right breast and a feeling of lumpiness. Right [facility] found the breast density to be category C. In the right breast there was skin thickening around the areola. This appeared unchanged. On tomography however there was a suggestion of a small spiculated mass in the inferior central right breast measuring 0.5 cm. This was palpable at the 5:00 position 2 cm from the nipple. Targeted ultrasonography confirmed an  irregular mass in this position measuring 0.8 cm. Ultrasound of the right axilla was negative.  Biopsy of this right breast lower inner quadrant mass 09/27/2016 showed ([id number]) invasive lobular carcinoma, E-cadherin negative, grade not stated, estrogen receptor 80% positive, progesterone receptor 5% positive, both with strong staining intensity, with an MIB-1 of 5%, and no HER-2 amplification, the signals ratio being 1.21 and the number per cell 2.30.  The patient's subsequent history is as detailed below.  INTERVAL HISTORY: Her case was also presented in the multidisciplinary breast cancer conference 10/13/2016. At that time a preliminary plan was proposed: Either mastectomy or lumpectomy with anti-estrogens, with consideration of genetics unless already done.  REVIEW OF SYSTEMS:  PAST MEDICAL HISTORY: Past Medical History:  Diagnosis Date  . Alopecia   . Anemia   . Arthritis   . Carcinoma in situ of breast    R 06/2006, L 06/2008, L 06/2011 LCIS  . Carotid bruit   . Diabetes mellitus    controlled by diet  . Diverticulosis   . Esophageal stricture   . Gastritis   . GERD (gastroesophageal reflux disease)   . Hyperlipidemia    intol of all statins  . Hypothyroid   . PAC (premature atrial contraction)   . Premature ventricular contraction   . Vitamin B12 deficiency     PAST SURGICAL HISTORY: Past Surgical History:  Procedure Laterality Date  . ABDOMINAL HYSTERECTOMY  1979  . APPENDECTOMY  2002  . BREAST BIOPSY  07/02/2011   Procedure: BREAST BIOPSY WITH NEEDLE LOCALIZATION;  Surgeon: Shann Medal, MD;  Location: [place];  Service: General;  Laterality: Left;  left breast atypical hyperplasia needle localization biopsy  . BREAST LUMPECTOMY  2008, 2010   X 2  . CATARACT EXTRACTION    .  CHOLECYSTECTOMY  2002  . ESOPHAGEAL DILATION  2006  . TONSILLECTOMY  1942    FAMILY HISTORY Family History  Problem Relation Age of Onset  . Colon cancer Mother   . Heart attack  Mother   . Breast cancer Sister   . Heart disease Maternal Uncle        CABG  . Breast cancer Sister   . Heart disease Brother   . Breast cancer Sister   . Heart disease Sister   . COPD Brother   . Heart disease Brother   . Anesthesia problems Neg Hx   The patient's father committed suicide at the age of 74. The patient's mother had a history of colon cancer diagnosed at age 72, she died at the age of 55 from unrelated causes. The patient is 31 sisters. 2 sisters died from breast cancer, 1 diagnosed age 15 diagnosed age 64. A third sister was diagnosed with breast cancer at age 57. The patient has 2 brothers. There is no other history of cancer in the family to the patient's knowledge.  GYNECOLOGIC HISTORY:  GX P1, first live birth age 65. The patient took Premarin alone for nearly 30 years, status post hysterectomy. No LMP recorded. Patient has had a hysterectomy.   SOCIAL HISTORY: She worked as a Network engineer for [employer]. Her husband of more than 90 year, Marcello Moores, "just left me 13 months ago and moved to an assisted living facility.". The patient enters me that her daughter Margarita Grizzle is estranged. The patient lives by herself, with no pets.   ADVANCED DIRECTIVES: Not in place. At the 10/28/2016 visit the patient was given the appropriate documents to complete and notarize at her discretion.   HEALTH MAINTENANCE: Social History  Substance Use Topics  . Smoking status: Never Smoker  . Smokeless tobacco: Never Used  . Alcohol use No     Colonoscopy:  PAP:  Bone density:   Allergies  Allergen Reactions  . Cephalexin   . Codeine   . Lansoprazole   . Latex   . Levofloxacin   . Nitrofurantoin   . Rofecoxib   . Statins     Current Outpatient Prescriptions  Medication Sig Dispense Refill  . aspirin EC 81 MG tablet Take 1 tablet (81 mg total) by mouth daily. 90 tablet 3  . blood glucose meter kit and supplies KIT Dispense based on patient and insurance preference. Check  sugars once daily and as needed as directed. 1 each 0  . clobetasol cream (TEMOVATE) 6.83 % 1 application as needed. Irritated skin    . cyanocobalamin (,VITAMIN B-12,) 1000 MCG/ML injection Inject 1,000 mcg into the muscle every 30 (thirty) days.     . flecainide (TAMBOCOR) 50 MG tablet Take 1 tablet (50 mg total) by mouth 2 (two) times daily. 180 tablet 3  . metFORMIN (GLUCOPHAGE-XR) 500 MG 24 hr tablet TAKE ONE TABLET BY MOUTH ONCE DAILY WITH  BREAKFAST 90 tablet 1  . metoprolol succinate (TOPROL-XL) 50 MG 24 hr tablet TAKE ONE TABLET BY MOUTH ONCE DAILY WITH OR IMMEDIATELY FOLLOWING A MEAL 30 tablet 11  . omeprazole (PRILOSEC) 20 MG capsule Take 1 capsule (20 mg total) by mouth daily. 90 capsule 3  . meclizine (ANTIVERT) 25 MG tablet Take 1 tablet (25 mg total) by mouth 3 (three) times daily as needed for dizziness. (Patient not taking: Reported on 10/28/2016) 65 tablet 0  . naproxen sodium (ANAPROX) 220 MG tablet Take 220 mg by mouth as needed.    Marland Kitchen  tamoxifen (NOLVADEX) 20 MG tablet Take 1 tablet (20 mg total) by mouth daily. 90 tablet 12   No current facility-administered medications for this visit.     OBJECTIVE:Older white woman in no acute distress  Vitals:   10/28/16 1521  BP: (!) 138/43  Pulse: 84  Resp: 18  Temp: 98 F (36.7 C)     Body mass index is 24.98 kg/m.   Wt Readings from Last 3 Encounters:  10/28/16 141 lb 12.8 oz (64.3 kg)  10/28/16 141 lb (64 kg)  09/27/16 144 lb (65.3 kg)      ECOG FS:1 - Symptomatic but completely ambulatory  Ocular: Sclerae unicteric, pupils round and equal Ear-nose-throat: Oropharynx clear and moist Lymphatic: No cervical or supraclavicular adenopathy Lungs no rales or rhonchi Heart regular rate and rhythm Abd soft, nontender, positive bowel sounds MSK no focal spinal tenderness, no joint edema Neuro: non-focal, well-oriented, appropriate affect Breasts: Both breasts are status post lumpectomy. I do not palpate a mass in the right  breast. Both axillae are benign.   LAB RESULTS:  CMP     Component Value Date/Time   NA 141 09/21/2016 0946   NA 143 07/13/2013 0854   K 4.8 09/21/2016 0946   K 4.9 07/13/2013 0854   CL 103 09/21/2016 0946   CL 106 07/11/2012 0901   CO2 31 09/21/2016 0946   CO2 29 07/13/2013 0854   GLUCOSE 182 (H) 09/21/2016 0946   GLUCOSE 142 (H) 07/13/2013 0854   GLUCOSE 110 (H) 07/11/2012 0901   BUN 17 09/21/2016 0946   BUN 14.2 07/13/2013 0854   CREATININE 0.91 09/21/2016 0946   CREATININE 0.9 07/13/2013 0854   CALCIUM 10.1 09/21/2016 0946   CALCIUM 9.8 07/13/2013 0854   PROT 7.0 09/21/2016 0946   PROT 6.7 07/13/2013 0854   ALBUMIN 4.3 09/21/2016 0946   ALBUMIN 3.7 07/13/2013 0854   AST 13 09/21/2016 0946   AST 16 07/13/2013 0854   ALT 11 09/21/2016 0946   ALT 11 07/13/2013 0854   ALKPHOS 49 09/21/2016 0946   ALKPHOS 50 07/13/2013 0854   BILITOT 0.9 09/21/2016 0946   BILITOT 0.66 07/13/2013 0854   GFRNONAA 51 (L) 07/01/2011 1615   GFRAA 60 (L) 07/01/2011 1615    No results found for: Ronnald Ramp, A1GS, A2GS, BETS, BETA2SER, GAMS, MSPIKE, SPEI  No results found for: Nils Pyle, [facility]  Lab Results  Component Value Date   WBC 4.4 02/10/2016   NEUTROABS 2.9 02/10/2016   HGB 12.0 02/10/2016   HCT 35.0 (L) 02/10/2016   MCV 91.8 02/10/2016   PLT 162.0 02/10/2016      Chemistry      Component Value Date/Time   NA 141 09/21/2016 0946   NA 143 07/13/2013 0854   K 4.8 09/21/2016 0946   K 4.9 07/13/2013 0854   CL 103 09/21/2016 0946   CL 106 07/11/2012 0901   CO2 31 09/21/2016 0946   CO2 29 07/13/2013 0854   BUN 17 09/21/2016 0946   BUN 14.2 07/13/2013 0854   CREATININE 0.91 09/21/2016 0946   CREATININE 0.9 07/13/2013 0854      Component Value Date/Time   CALCIUM 10.1 09/21/2016 0946   CALCIUM 9.8 07/13/2013 0854   ALKPHOS 49 09/21/2016 0946   ALKPHOS 50 07/13/2013 0854   AST 13 09/21/2016 0946   AST 16 07/13/2013 0854   ALT 11  09/21/2016 0946   ALT 11 07/13/2013 0854   BILITOT 0.9 09/21/2016 0946   BILITOT 0.66 07/13/2013 0854  Lab Results  Component Value Date   LABCA2 18 10/07/2009    No components found for: [id number]  No results for input(s): INR in the last 168 hours.  Urinalysis No results found for: COLORURINE, APPEARANCEUR, LABSPEC, PHURINE, GLUCOSEU, HGBUR, BILIRUBINUR, KETONESUR, PROTEINUR, UROBILINOGEN, NITRITE, LEUKOCYTESUR   STUDIES: Recent studies discussed with patient. She understands the difficulty of detecting lobular breast cancer by routine mammography.  ELIGIBLE FOR AVAILABLE RESEARCH PROTOCOL: no  ASSESSMENT: 83 y.o. [place] woman   (1) status post left lumpectomy March 2008 for a pT1c invasive ductal carcinoma, grade 1, strongly estrogen and progesterone receptor positive, HER-2 not amplified, with a borderline MIB-1  (a) received tamoxifen, but very intermittently  (2) status post right lumpectomy and sentinel lymph node sampling 06/28/2008 for a pT1c pN0, stage IA invasive ductal carcinoma, grade 1, estrogen receptor 96% positive, progesterone receptor negative, HER-2/neu negative, with an MIB-1 of 14%  (a) received anastrozole April 2010 to April 2013  (3) status post excision of an area of lobular carcinoma in situ, left breast, 07/02/2011  (a) on tamoxifen April 2013 to April 2015.  (4) status post biopsy of the right breast lower inner quadrant 09/27/2016 for a clinical T1a N0 invasive lobular carcinoma, E-cadherin negative, estrogen and progesterone receptor positive, with no HER-2 amplification and an MIB-1 of 5%.  (5) Definitive surgery pending  (6) adjuvant radiation to follow  (7) tamoxifen for 5 years   (8) Genetics testing pending  PLAN: We spent the better part of today's hour-long appointment discussing the biology of breast cancer in general, and the specifics of the patient's tumor in particular. Perlita understands this is her third breast  cancer and fourth breast surgery. She will benefit from adjuvant radiation to reduce the risk of local recurrence and she will benefit from anti-estrogens even if the tumor proves to be as small as 5 mm, because of risk reduction.  She has a significant family history of breast cancer in addition to her own personal history. She certainly warrants genetics testing and this is being arranged.  She never did take tamoxifen regularly. She took anastrozole for a few years. I think at this point going to anastrozole may be preferable. We discussed the possible toxicities, side effects and complications of this agent in detail. The order has been put in for her.  I expect she will have her surgery mid-August and complete her radiation mid to late September. Accordingly she will see me again in October. We will initiate long-term follow-up at that point.   Soo has a good understanding of the overall plan. She agrees with it. She knows the goal of treatment in her case is cure. She will call with any problems that may develop before her next visit here.  Chauncey Cruel, MD   10/31/2016 2:48 PM Medical Oncology and Hematology [facility] [address] [place], [place] [address] Tel. [phone]    Fax. [phone]

## 2016-10-28 NOTE — Progress Notes (Signed)
Radiation Oncology         (336) [phone] ________________________________  Name: Tami Jacobson MRN: [id number]  Date: 10/28/2016  DOB: 02/18/1928  CC:Binnie Rail, MD  Alphonsa Overall, MD     REFERRING PHYSICIAN: Alphonsa Overall, MD   DIAGNOSIS: Invasive lobular carcinoma of the right breast, ER/PR pos, Her2-neu neg, with history of bilateral breast cancers.   HISTORY OF PRESENT ILLNESS: Tami Jacobson is a 83 y.o. female with a history of bilateral breast cancers and is s/p lumpectomy in both breasts. Her history includes a  left grade 2 ER/PR positive DCIS in 2008 treated with adjuvant Tamoxifen. She developed a new Stage I, T1cN0 grade 1 invasive ductal carcinoma of the right breast in 2010, and she subsequently underwent lumpectomy and continued endocrine blockade with Arimidex. She the had a left lobular carcinoma in situ of the left breast in March 2013 and remained on endocrine therapy until 2015. She was released to routine follow up and her PCP has been managing her mammography and performing breast exams. She had a normal mammogram in March 2018, but presented to her PCP on 09/15/16 with reports of right breast sensitivity lasting 5 days beginning with the lifting of heavy books. She denied palpable lumps or skin changes, and there was no nipple discharge upon exam. PCP did however note palpable firmness and tenderness in nipple-areolar complex. Diagnosticimaging on 09/23/16 showed suspicious 0.8 x 0.5 x 0.4 cm mass in the 5 o'clock position of the right breast, no axillary adenopathy was present. Slight skin thickening of the periareolar right breast appeared chronic, likely related to prior treatment changes.   US guided biopsy was recommended and performed on 09/27/16. Pathology revealed invasive lobular carcinoma, ER/PR positive, Her2-neu negative. She comes today to discuss options of radiotherapy. She is going to meet with medical oncology later today and is hoping to schedule  lumpectomy with Dr. Lucia Gaskins soon.  PREVIOUS RADIATION THERAPY: No   PAST MEDICAL HISTORY:  Past Medical History:  Diagnosis Date  . Alopecia   . Anemia   . Arthritis   . Carcinoma in situ of breast    R 06/2006, L 06/2008, L 06/2011 LCIS  . Carotid bruit   . Diabetes mellitus    controlled by diet  . Diverticulosis   . Esophageal stricture   . Gastritis   . GERD (gastroesophageal reflux disease)   . Hyperlipidemia    intol of all statins  . Hypothyroid   . PAC (premature atrial contraction)   . Premature ventricular contraction   . Vitamin B12 deficiency        PAST SURGICAL HISTORY: Past Surgical History:  Procedure Laterality Date  . ABDOMINAL HYSTERECTOMY  1979  . APPENDECTOMY  2002  . BREAST BIOPSY  07/02/2011   Procedure: BREAST BIOPSY WITH NEEDLE LOCALIZATION;  Surgeon: Shann Medal, MD;  Location: [place];  Service: General;  Laterality: Left;  left breast atypical hyperplasia needle localization biopsy  . BREAST LUMPECTOMY  2008, 2010   X 2  . CATARACT EXTRACTION    . CHOLECYSTECTOMY  2002  . ESOPHAGEAL DILATION  2006  . TONSILLECTOMY  1942     FAMILY HISTORY:  Family History  Problem Relation Age of Onset  . Colon cancer Mother   . Heart attack Mother   . Breast cancer Sister   . Heart disease Maternal Uncle        CABG  . Breast cancer Sister   . Heart disease Brother   .  Breast cancer Sister   . Heart disease Sister   . COPD Brother   . Heart disease Brother   . Anesthesia problems Neg Hx      SOCIAL HISTORY:  reports that she has never smoked. She has never used smokeless tobacco. She reports that she does not drink alcohol or use drugs. The patient lives in [place]. She remains independent and active in her social group.   ALLERGIES: Cephalexin; Codeine; Lansoprazole; Latex; Levofloxacin; Nitrofurantoin; Rofecoxib; and Statins   MEDICATIONS:  Current Outpatient Prescriptions  Medication Sig Dispense Refill  . aspirin EC 81 MG tablet  Take 1 tablet (81 mg total) by mouth daily. 90 tablet 3  . blood glucose meter kit and supplies KIT Dispense based on patient and insurance preference. Check sugars once daily and as needed as directed. 1 each 0  . clobetasol cream (TEMOVATE) 9.37 % 1 application as needed. Irritated skin    . cyanocobalamin (,VITAMIN B-12,) 1000 MCG/ML injection Inject 1,000 mcg into the muscle every 30 (thirty) days.     . flecainide (TAMBOCOR) 50 MG tablet Take 1 tablet (50 mg total) by mouth 2 (two) times daily. 180 tablet 3  . metFORMIN (GLUCOPHAGE-XR) 500 MG 24 hr tablet TAKE ONE TABLET BY MOUTH ONCE DAILY WITH  BREAKFAST 90 tablet 1  . metoprolol succinate (TOPROL-XL) 50 MG 24 hr tablet TAKE ONE TABLET BY MOUTH ONCE DAILY WITH OR IMMEDIATELY FOLLOWING A MEAL 30 tablet 11  . naproxen sodium (ANAPROX) 220 MG tablet Take 220 mg by mouth as needed.    Marland Kitchen omeprazole (PRILOSEC) 20 MG capsule Take 1 capsule (20 mg total) by mouth daily. 90 capsule 3  . meclizine (ANTIVERT) 25 MG tablet Take 1 tablet (25 mg total) by mouth 3 (three) times daily as needed for dizziness. (Patient not taking: Reported on 10/28/2016) 65 tablet 0  . tamoxifen (NOLVADEX) 20 MG tablet Take 1 tablet (20 mg total) by mouth daily. 90 tablet 12   No current facility-administered medications for this encounter.      REVIEW OF SYSTEMS: On review of systems, the patient reports that she is doing well overall. She's anxious to proceed with surgery. She reports she's had tenderness in the right breast, but denies any problems with nipple bleeding or discharge. She denies any chest pain, shortness of breath, cough, fevers, chills, night sweats, unintended weight changes. She denies any bowel or bladder disturbances, and denies abdominal pain, nausea or vomiting. She denies any new musculoskeletal or joint aches or pains. A complete review of systems is obtained and is otherwise negative.     PHYSICAL EXAM:  Wt Readings from Last 3 Encounters:    10/28/16 141 lb 12.8 oz (64.3 kg)  10/28/16 141 lb (64 kg)  09/27/16 144 lb (65.3 kg)   Temp Readings from Last 3 Encounters:  10/28/16 98 F (36.7 C) (Oral)  10/28/16 98 F (36.7 C) (Oral)  09/21/16 97.8 F (36.6 C) (Oral)   BP Readings from Last 3 Encounters:  10/28/16 (!) 138/43  10/28/16 (!) 138/43  09/27/16 (!) 128/56   Pulse Readings from Last 3 Encounters:  10/28/16 84  10/28/16 84  09/27/16 61   Pain Assessment Pain Score: 3 /10  In general this is a well appearing caucasian female in no acute distress. She is alert and oriented x4 and appropriate throughout the examination. HEENT reveals that the patient is normocephalic, atraumatic. EOMs are intact. PERRLA. Skin is intact without any evidence of gross lesions. Cardiovascular exam reveals  a regular rate and rhythm, no clicks rubs or murmurs are auscultated. Chest is clear to auscultation bilaterally. Lymphatic assessment is performed and does not reveal any adenopathy in the cervical, supraclavicular, axillary, or inguinal chains. Bilateral breasts are examined and two lumpectomy sites are noted of the right breast as well as the left. The right breast has a small palpable nodular area deep to the skin in the lower outer quadrant of the right breast. No masses are noted of the left, and no nipple bleeding or discharge is noted. Abdomen has active bowel sounds in all quadrants and is intact. The abdomen is soft, non tender, non distended. Lower extremities are negative for pretibial pitting edema, deep calf tenderness, cyanosis or clubbing.   ECOG = 1  0 - Asymptomatic (Fully active, able to carry on all predisease activities without restriction)  1 - Symptomatic but completely ambulatory (Restricted in physically strenuous activity but ambulatory and able to carry out work of a light or sedentary nature. For example, light housework, office work)  2 - Symptomatic, <50% in bed during the day (Ambulatory and capable of all  self care but unable to carry out any work activities. Up and about more than 50% of waking hours)  3 - Symptomatic, >50% in bed, but not bedbound (Capable of only limited self-care, confined to bed or chair 50% or more of waking hours)  4 - Bedbound (Completely disabled. Cannot carry on any self-care. Totally confined to bed or chair)  5 - Death   Eustace Pen MM, Creech RH, Tormey DC, et al. [phone]). "Toxicity and response criteria of the [facility]". [place] Oncol. 5 (6): 649-55    LABORATORY DATA:  Lab Results  Component Value Date   WBC 4.4 02/10/2016   HGB 12.0 02/10/2016   HCT 35.0 (L) 02/10/2016   MCV 91.8 02/10/2016   PLT 162.0 02/10/2016   Lab Results  Component Value Date   NA 141 09/21/2016   K 4.8 09/21/2016   CL 103 09/21/2016   CO2 31 09/21/2016   Lab Results  Component Value Date   ALT 11 09/21/2016   AST 13 09/21/2016   ALKPHOS 49 09/21/2016   BILITOT 0.9 09/21/2016      RADIOGRAPHY: No results found.     IMPRESSION/PLAN: 1. Dr. Lisbeth Renshaw discusses the pathology findings and reviews the nature of right breast disease. The patient is going to proceed with breast conservation with lumpectomy with sentinel mapping. She is hoping to also have a MRI and I've encouraged her to discuss this with Dr. Lucia Gaskins. The patient will meet with Dr. Jana Hakim, and provided that chemotherapy is not indicated, the patient's course would then be followed by external radiotherapy to the breast followed by antiestrogen therapy. We discussed the risks, benefits, short, and long term effects of radiotherapy, and the patient is interested in proceeding. Dr. Lisbeth Renshaw discusses the delivery and logistics of radiotherapy and recommends a 4 week treatment. We will see her back about 2 weeks after surgery to move forward with the simulation and planning process and anticipate starting radiotherapy about 4 weeks after surgery.  2. History of bilateral breast cancers. The patient  is not interested in genetic counseling personally, but her daughter has recently tested negative for BRCA about 3 years ago. She will keep [place] informed of any questions regarding this option.  The above documentation reflects my direct findings during this shared patient visit. Please see the separate note by Dr. Lisbeth Renshaw on this date  for the remainder of the patient's plan of care.    Carola Rhine, PAC  This document serves as a record of services personally performed by Kyung Rudd, MD and Shona Simpson, PA-C. It was created on their behalf by Linward Natal, a trained medical scribe. The creation of this record is based on the scribe's personal observations and the provider's statements to them. This document has been checked and approved by the attending provider.

## 2016-10-28 NOTE — Progress Notes (Signed)
Please see the Nurse Progress Note in the MD Initial Consult Encounter for this patient. 

## 2016-10-29 ENCOUNTER — Other Ambulatory Visit: Payer: Self-pay | Admitting: Surgery

## 2016-10-29 DIAGNOSIS — C50911 Malignant neoplasm of unspecified site of right female breast: Secondary | ICD-10-CM

## 2016-10-29 DIAGNOSIS — Z170 Estrogen receptor positive status [ER+]: Principal | ICD-10-CM

## 2016-11-02 ENCOUNTER — Other Ambulatory Visit: Payer: Self-pay | Admitting: Surgery

## 2016-11-02 ENCOUNTER — Ambulatory Visit: Payer: Medicare Other | Admitting: Podiatry

## 2016-11-02 DIAGNOSIS — Z170 Estrogen receptor positive status [ER+]: Principal | ICD-10-CM

## 2016-11-02 DIAGNOSIS — C50911 Malignant neoplasm of unspecified site of right female breast: Secondary | ICD-10-CM

## 2016-11-03 DIAGNOSIS — C50911 Malignant neoplasm of unspecified site of right female breast: Secondary | ICD-10-CM

## 2016-11-03 HISTORY — DX: Malignant neoplasm of unspecified site of right female breast: C50.911

## 2016-11-05 ENCOUNTER — Encounter: Payer: Self-pay | Admitting: *Deleted

## 2016-11-05 NOTE — Progress Notes (Signed)
[facility] Psychosocial Distress Screening Clinical Social Work  Clinical Social Work was referred by distress screening protocol.  The patient scored a 8 on the Psychosocial Distress Thermometer which indicates severe distress. Clinical Social Worker reviewed chart and phoned pt to assess for distress and other psychosocial needs. CSW left brief message for pt on home phone; CSW introduced self, explained role of CSW/Pt and Family Support Team, support groups and other resources to assist. CSW left contact information and encouraged return call.    ONCBCN DISTRESS SCREENING 10/28/2016  Screening Type Initial Screening  Distress experienced in past week (1-10) 8  Family Problem type Partner;Children  Emotional problem type Nervousness/Anxiety;Adjusting to illness  Information Concerns Type Lack of info about diagnosis;Lack of info about treatment;Lack of info about complementary therapy choices  Physical Problem type Pain;Loss of appetitie;Skin dry/itchy  Physician notified of physical symptoms Yes  Referral to clinical social work Yes    Clinical Social Worker follow up needed: No.  If yes, follow up plan:  Loren Racer, Marlinda Mike, OSW-C Clinical Social Worker [place]  [facility] Phone: [phone] Fax: [phone]

## 2016-11-11 ENCOUNTER — Encounter (HOSPITAL_BASED_OUTPATIENT_CLINIC_OR_DEPARTMENT_OTHER): Payer: Self-pay | Admitting: *Deleted

## 2016-11-11 NOTE — Pre-Procedure Instructions (Signed)
To come for BMET and to pick up water 8 oz. - to drink by [id number] DOS

## 2016-11-15 ENCOUNTER — Encounter: Payer: Self-pay | Admitting: Genetic Counselor

## 2016-11-15 ENCOUNTER — Ambulatory Visit (HOSPITAL_BASED_OUTPATIENT_CLINIC_OR_DEPARTMENT_OTHER): Payer: Medicare Other | Admitting: Genetic Counselor

## 2016-11-15 ENCOUNTER — Other Ambulatory Visit: Payer: Medicare Other

## 2016-11-15 DIAGNOSIS — Z853 Personal history of malignant neoplasm of breast: Secondary | ICD-10-CM

## 2016-11-15 DIAGNOSIS — C50812 Malignant neoplasm of overlapping sites of left female breast: Secondary | ICD-10-CM

## 2016-11-15 DIAGNOSIS — Z800 Family history of malignant neoplasm of digestive organs: Secondary | ICD-10-CM

## 2016-11-15 DIAGNOSIS — C50311 Malignant neoplasm of lower-inner quadrant of right female breast: Secondary | ICD-10-CM | POA: Diagnosis present

## 2016-11-15 DIAGNOSIS — Z170 Estrogen receptor positive status [ER+]: Secondary | ICD-10-CM

## 2016-11-15 DIAGNOSIS — Z803 Family history of malignant neoplasm of breast: Secondary | ICD-10-CM | POA: Diagnosis not present

## 2016-11-15 NOTE — Progress Notes (Signed)
REFERRING PROVIDER: Chauncey Cruel, MD [address] [place], [place] [address]  PRIMARY PROVIDER:  Binnie Rail, MD  PRIMARY REASON FOR VISIT:  1. Malignant neoplasm of overlapping sites of left breast in female, estrogen receptor positive ([facility])   2. Family history of breast cancer   3. Family history of colon cancer   4. Family history of pancreatic cancer   5. History of breast cancer, Right, T1c, N0, lumpectomy 06/26/2008.   Left, T1c, N0, lumpectomy 06/24/2006.      HISTORY OF PRESENT ILLNESS:   Tami Jacobson, a 83 y.o. female, was seen for a [facility] cancer genetics consultation at the request of Dr. Jana Hakim due to a personal and family history of cancer.  Ms. Brewton presents to clinic today to discuss the possibility of a hereditary predisposition to cancer, genetic testing, and to further clarify her future cancer risks, as well as potential cancer risks for family members.   In 2008, at the age of 77, Ms. Blackard was diagnosed with invasive cancer of the left breast. This was treated with lumpectomy and tamoxifen.  In 2010, at the age of 20, Ms. Gervais was diagnosed with invasive cancer of the right breast.  This was treated with lumpectomy and tamoxifen.  In 2018, at the age of 58, Ms. Sedam was diagnosed with invasive cancer of the left breast.  She will get a lumpectomy, radiation and tamoxifen.  The patient reports that her daughter had genetic testing and is reportedly negative.    CANCER HISTORY:   No history exists.     HORMONAL RISK FACTORS:  Menarche was at age 22.  First live birth at age 59.  OCP use for approximately 0 years.  Ovaries intact: yes.  Hysterectomy: yes.  Menopausal status: postmenopausal.  HRT use: 0 years. Colonoscopy: yes; had one polyp.  Total of 5 colonoscopies. Mammogram within the last year: yes. Number of breast biopsies: 4. Up to date with pelvic exams:  yes. Any excessive radiation exposure in the past:  no  Past  Medical History:  Diagnosis Date  . Arthritis    left hip  . Breast cancer, right ([facility]) 11/2016  . Family history of breast cancer   . Family history of colon cancer   . Family history of pancreatic cancer   . GERD (gastroesophageal reflux disease)   . Non-insulin dependent type 2 diabetes mellitus ([facility])   . PAC (premature atrial contraction)   . Premature ventricular contraction   . Seasonal allergies   . Sensitive skin   . Vitamin B12 deficiency     Past Surgical History:  Procedure Laterality Date  . ABDOMINAL HYSTERECTOMY  1979   partial  . BREAST BIOPSY  07/02/2011   Procedure: BREAST BIOPSY WITH NEEDLE LOCALIZATION;  Surgeon: Shann Medal, MD;  Location: [facility];  Service: General;  Laterality: Left;  left breast atypical hyperplasia needle localization biopsy  . BREAST LUMPECTOMY Left 07/04/2006  . BREAST LUMPECTOMY Right 06/26/2008  . CATARACT EXTRACTION Bilateral   . CHOLECYSTECTOMY, LAPAROSCOPIC  07/07/2001  . ESOPHAGEAL DILATION  2006  . LAPAROSCOPIC APPENDECTOMY  07/07/2001  . TONSILLECTOMY  1942    Social History   Social History  . Marital status: Legally Separated    Spouse name: Marcello Moores  . Number of children: 1  . Years of education: N/A   Social History Main Topics  . Smoking status: Never Smoker  . Smokeless tobacco: Never Used  . Alcohol use No  . Drug use:  No  . Sexual activity: Not Asked   Other Topics Concern  . None   Social History Narrative   Retired Network engineer   Married, lives with spouse     FAMILY HISTORY:  We obtained a detailed, 4-generation family history.  Significant diagnoses are listed below: Family History  Problem Relation Age of Onset  . Colon cancer Mother 24  . Heart attack Mother   . Breast cancer Sister 38  . Heart disease Maternal Uncle        CABG  . Breast cancer Sister 10  . Heart disease Brother   . Other Father        suicide  . Breast cancer Sister 71  . Heart disease Sister   . COPD Brother   . Heart  disease Brother   . Breast cancer Other 19       niece - brother's daughter  . Pancreatic cancer Other        brother's son, dx in his 4s    The patient has one daughter and one grandson who are cancer free.  Her daughter reportedly has tested negative for BRCA mutations.  She has four sisters and two brothers.  Three sisters were diagnosed with breast cancer, one sister and one brother died of heart disease and one brother is living.  This brother had a son who died of pancreatic cancer.  The other brother has a daughter who had breast cancer at 48.  The patient's parents are both deceased.  Her mother had colon cancer at 16.  She was one of 10 children.  No other siblings had cancer.  The patient's maternal grandparents are deceased from unknown causes.  The patient's father died from suicide at 81.  He was in the process of being worked up for a GI problem and had an endoscopy scheduled a few days after he shot himself.  He was one of 8 children.  No others had cancer.  His father died of some form of cancer in his 28's.  Patient's ancestors are of Scot[place]Irish descent. There is no reported Ashkenazi Jewish ancestry. There is no known consanguinity.  GENETIC COUNSELING ASSESSMENT: JOSELY MOFFAT is a 83 y.o. female with a personal and family history of breast cancer and family history of colon and pancreatic cancer which is somewhat suggestive of a hereditary cancer syndrome and predisposition to cancer. We, therefore, discussed and recommended the following at today's visit.   DISCUSSION: We discussed that about 5-10% of breast cancer is hereditary with most cases due to BRCA mutations.  Other genes associated with an increased risk for breast and pancreatic cancer include ATM and PALB2, and possibly Lynch syndrome.  CHEK2 can be associated with colon and breast cancer.  We discussed that genetic testing can help determine her medical management after her cancer treatment, it could help with  current management of her cancer including surgical options as well as PARP inhibitor use, and it would allow [place] to offer testing to family members so that they could learn their risk for developing cancer. We reviewed the characteristics, features and inheritance patterns of hereditary cancer syndromes. We also discussed genetic testing, including the appropriate family members to test, the process of testing, insurance coverage and turn-around-time for results. We discussed the implications of a negative, positive and/or variant of uncertain significant result. We recommended Ms. Zani pursue genetic testing for the common hereditary cancer gene panel. The Hereditary Gene Panel offered by [facility] includes sequencing and/or deletion duplication  testing of the following 46 genes: APC, ATM, AXIN2, BARD1, BMPR1A, BRCA1, BRCA2, BRIP1, CDH1, CDKN2A (p14ARF), CDKN2A (p16INK4a), CHEK2, CTNNA1, DICER1, EPCAM (Deletion/duplication testing only), GREM1 (promoter region deletion/duplication testing only), KIT, MEN1, MLH1, MSH2, MSH3, MSH6, MUTYH, NBN, NF1, NHTL1, PALB2, PDGFRA, PMS2, POLD1, POLE, PTEN, RAD50, RAD51C, RAD51D, SDHB, SDHC, SDHD, SMAD4, SMARCA4. STK11, TP53, TSC1, TSC2, and VHL.  The following genes were evaluated for sequence changes only: SDHA and HOXB13 c.251G>A variant only.  Based on Ms. Newsom's personal and family history of cancer, she meets medical criteria for genetic testing. Despite that she meets criteria, she may still have an out of pocket cost. We discussed that if her out of pocket cost for testing is over $100, the laboratory will call and confirm whether she wants to proceed with testing.  If the out of pocket cost of testing is less than $100 she will be billed by the genetic testing laboratory.   The patient discussed that she is dealing with a lot of emotional issues right now.  She has not spoken with her husband or daughter in 34 months.  Her divorce will go through next week.  A  friend recently died, and she feels like she is all alone.  We discussed who she has to talk to about all she is going through and she mentioned the daughter of a good friend, as well as an older friend who lost her husband a few years back.  She may benefit from a referral to social work or to see one of the counseling interns here at the cancer center.  PLAN: Despite our recommendation, Ms. Fagerstrom did not wish to pursue genetic testing at today's visit. We understand this decision, and remain available to coordinate genetic testing at any time in the future. We; therefore, recommend Ms. Bramblett continue to follow the cancer screening guidelines given by her primary healthcare provider.  Based on Ms. Delbridge's family history, we recommended her niece, who was diagnosed with breast cancer at age 76, have genetic counseling and testing. Ms. Strada will let us know if we can be of any assistance in coordinating genetic counseling and/or testing for this family member.   Lastly, we encouraged Ms. Canion to remain in contact with cancer genetics annually so that we can continuously update the family history and inform her of any changes in cancer genetics and testing that may be of benefit for this family.   Ms.  Breon questions were answered to her satisfaction today. Our contact information was provided should additional questions or concerns arise. Thank you for the referral and allowing [place] to share in the care of your patient.   Karen P. Florene Glen, [place], [facility] Certified Genetic Counselor [email or web address] phone: [phone]  The patient was seen for a total of 60 minutes in face-to-face genetic counseling.  This patient was discussed with Drs. Magrinat, Lindi Adie and/or Burr Medico who agrees with the above.    _______________________________________________________________________ For Office Staff:  Number of people involved in session: 1 Was an Intern/ student involved with case: yes: Rise Paganini

## 2016-11-17 ENCOUNTER — Inpatient Hospital Stay: Admission: RE | Admit: 2016-11-17 | Payer: Medicare Other | Source: Ambulatory Visit

## 2016-11-17 ENCOUNTER — Encounter (HOSPITAL_BASED_OUTPATIENT_CLINIC_OR_DEPARTMENT_OTHER)
Admission: RE | Admit: 2016-11-17 | Discharge: 2016-11-17 | Disposition: A | Discharge status: Home / Self Care | Payer: Medicare Other | Source: Ambulatory Visit | Attending: Surgery | Admitting: Surgery

## 2016-11-17 ENCOUNTER — Ambulatory Visit
Admission: RE | Admit: 2016-11-17 | Discharge: 2016-11-17 | Disposition: A | Discharge status: Home / Self Care | Payer: Medicare Other | Source: Ambulatory Visit | Attending: Surgery | Admitting: Surgery

## 2016-11-17 ENCOUNTER — Other Ambulatory Visit: Payer: Self-pay | Admitting: Surgery

## 2016-11-17 ENCOUNTER — Encounter: Payer: Self-pay | Admitting: *Deleted

## 2016-11-17 DIAGNOSIS — Z853 Personal history of malignant neoplasm of breast: Secondary | ICD-10-CM | POA: Diagnosis not present

## 2016-11-17 DIAGNOSIS — C50911 Malignant neoplasm of unspecified site of right female breast: Secondary | ICD-10-CM

## 2016-11-17 DIAGNOSIS — Z885 Allergy status to narcotic agent status: Secondary | ICD-10-CM | POA: Diagnosis not present

## 2016-11-17 DIAGNOSIS — E039 Hypothyroidism, unspecified: Secondary | ICD-10-CM | POA: Diagnosis not present

## 2016-11-17 DIAGNOSIS — Z888 Allergy status to other drugs, medicaments and biological substances status: Secondary | ICD-10-CM | POA: Diagnosis not present

## 2016-11-17 DIAGNOSIS — Z170 Estrogen receptor positive status [ER+]: Principal | ICD-10-CM

## 2016-11-17 DIAGNOSIS — Z8041 Family history of malignant neoplasm of ovary: Secondary | ICD-10-CM | POA: Diagnosis not present

## 2016-11-17 DIAGNOSIS — Z7982 Long term (current) use of aspirin: Secondary | ICD-10-CM | POA: Diagnosis not present

## 2016-11-17 DIAGNOSIS — K219 Gastro-esophageal reflux disease without esophagitis: Secondary | ICD-10-CM | POA: Diagnosis not present

## 2016-11-17 DIAGNOSIS — C50311 Malignant neoplasm of lower-inner quadrant of right female breast: Secondary | ICD-10-CM | POA: Diagnosis not present

## 2016-11-17 DIAGNOSIS — Z91040 Latex allergy status: Secondary | ICD-10-CM | POA: Diagnosis not present

## 2016-11-17 DIAGNOSIS — Z79899 Other long term (current) drug therapy: Secondary | ICD-10-CM | POA: Diagnosis not present

## 2016-11-17 DIAGNOSIS — Z881 Allergy status to other antibiotic agents status: Secondary | ICD-10-CM | POA: Diagnosis not present

## 2016-11-17 DIAGNOSIS — R011 Cardiac murmur, unspecified: Secondary | ICD-10-CM | POA: Diagnosis not present

## 2016-11-17 DIAGNOSIS — Z7984 Long term (current) use of oral hypoglycemic drugs: Secondary | ICD-10-CM | POA: Diagnosis not present

## 2016-11-17 DIAGNOSIS — I10 Essential (primary) hypertension: Secondary | ICD-10-CM | POA: Diagnosis not present

## 2016-11-17 DIAGNOSIS — E1151 Type 2 diabetes mellitus with diabetic peripheral angiopathy without gangrene: Secondary | ICD-10-CM | POA: Diagnosis not present

## 2016-11-17 LAB — BASIC METABOLIC PANEL
Anion gap: 8 (ref 5–15)
BUN: 12 mg/dL (ref 6–20)
CALCIUM: 9.5 mg/dL (ref 8.9–10.3)
CO2: 29 mmol/L (ref 22–32)
Chloride: 102 mmol/L (ref 101–111)
Creatinine, Ser: 1.1 mg/dL — ABNORMAL HIGH (ref 0.44–1.00)
GFR calc Af Amer: 51 mL/min — ABNORMAL LOW (ref >60)
GFR, EST NON AFRICAN AMERICAN: 44 mL/min — AB (ref >60)
GLUCOSE: 172 mg/dL — AB (ref 65–99)
Potassium: 4.3 mmol/L (ref 3.5–5.1)
Sodium: 139 mmol/L (ref 135–145)

## 2016-11-17 NOTE — H&P (Signed)
Tami Jacobson  Location: [facility] Patient #: [id number] DOB: [birth year] Married / Language: English / Race: White Female  History of Present Illness   The patient is a 83 year old female who presents with a complaint of new breast cancer.  The PCP is Dr. Billey Gosling  She comes by herself.  [name] returns with a new right-sided breast cancer. This has been a traumatic year for her, in large part because her husband has recently left her. She has only one daughter, who lives in [place], and she thinks that her daughter is behind her husband leaving. I last saw [name] on July 27, 2012. She has been doing well from a physical standpoint.  Mammograms: [facility] - she had a mammogram that showed an abnormality int he right breast at 5 o'clock. This measurd about 0.8 cm on US Biopsy: Right breast biopsy, 5 o'clock, 09/27/2016 ([id number]) - IDC, ER - 80%, PR -5%, Ki67 -%, Her2neu - negative. Family history of breast or ovarian cancer: This is her 3rd breast cancer. She has 3 sisters who have had breast cancer.  I discussed the options for breast cancer treatment with the patient. The patient is at the [facility], which includes medical oncology and radiation oncology. I discussed the surgical options of lumpectomy vs. mastectomy. If mastectomy, there is the possibility of reconstruction. I discussed the options of lymph node biopsy. The treatment plan depends on the pathologic staging of the tumor and the patient's personal wishes. The risks of surgery include, but are not limited to, bleeding, infection, the need for further surgery, and nerve injury. The patient has been given literature on the treatment of breast cancer.  Plan: 1) Med onc (Magrinat) and rad onc consultation, 2) probable right lumpectomy (seed loc) alone (unless oncology thinks differently), 3) anti-estrogen, 4) no radiation tx, 5) I spent a  long time talking to her about genetics, but she is not interested at this time. Note - she is anxious to move ahead, beacuse of all what is going on her life.  Past Medical History: 1. T1c, N0 right breast cancer - 9 o'clock Lumpectomy 06/26/2008. ER - positive, PR - negative, Ki67 - 14%. Her2Neu - neg Dr. Jana Hakim and Tammi Klippel treating oncologist. She did not get radiation tx. Switched to Tamoxifen 2013 Tolerating this well. Wil continue for 5 years - till 2015.  2. T1c, N0 left breast cancer. Lumpectomy (no sentinel node) 06/24/2006. Dr. Modena Morrow did this surgery. 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 Ki67 - 12%. Her2Neu - neg. She did not get radiatio tx.  3. Left breast atypical lobular hyperplasia. Open biopsy by me - 07/02/2011 - shows atypical lobular neoplasia, no cancerous changes.  4. Left ear complaint/vertgo.  She sees Dr. Loletha Grayer. [facility]. 5. Hypertension x 10 years 6. GERD. 7. She was hypothyroid and on replacement tx, but no more 8. She has seend Dr. Stanford Breed for cardiology 9. Diabetes mellitus - 2013.9 - on Metformin HgbA1C - 09/21/2016 - 7.1 10. Colonoscopy - 2012 11. History of cholecystectomy and appendectomy (2003 Ninfa Linden) and partial hysterectomy  Social History: Just separated - moving with divorce. Her husband had a stroke, she took care of him in recovery, but has left her. She has a daughter who lives in [place], but she thinks that she is behind her husband leaving her. So she has no immediate family.   Past Surgical History Tami Jacobson, [place]; 10/19/2016 3:12 PM)  Appendectomy  Breast Biopsy  Bilateral. Breast Mass; Local Excision  Bilateral. Colon Polyp Removal - Colonoscopy  Gallbladder Surgery - Laparoscopic  Hysterectomy (not due to cancer) - Partial  Tonsillectomy    Diagnostic Studies History Tami Jacobson, RMA; 10/19/2016 3:12 PM) Colonoscopy  5-10 years ago Mammogram  within last year Pap Smear  1-5 years ago  Medication History Tami Jacobson, RMA; 10/19/2016 3:17 PM) MetFORMIN HCl (Oral) Specific strength unknown - Active. Omeprazole (20MG Capsule DR, Oral) Active. Blood Glucose Meter Active. Flecainide Acetate (50MG Tablet, Oral) Active. Metoprolol Succinate (Oral) Specific strength unknown - Active. Cyanocobalamin (1000MCG/ML Solution, Injection) Active. Aspirin EC (81MG Tablet DR, Oral) Active. Clobetasol Propionate (External) Specific strength unknown - Active. Flecainide Acetate (100MG Tablet, Oral) Active. Toprol XL (50MG Tablet ER, Oral) Active. Medications Reconciled  Social History Tami Jacobson, [place]; 10/19/2016 3:12 PM) Caffeine use  Coffee. No alcohol use  Tobacco use  Never smoker.  Family History Tami Jacobson, [place]; 10/19/2016 3:12 PM) Breast Cancer  Sister.  Pregnancy / Birth History Tami Jacobson, [place]; 10/19/2016 3:12 PM) Age at menarche  19 years. Age of menopause  64-50 Gravida  1 Maternal age  >72 Para  1  Other Problems Tami Jacobson, [place]; 10/19/2016 3:12 PM) General anesthesia - complications  Heart murmur  Inguinal Hernia  Pancreatitis  Transfusion history     Review of Systems Tami Jacobson RMA; 10/19/2016 3:12 PM) General Present- Appetite Loss, Fatigue and Night Sweats. Not Present- Chills, Fever, Weight Gain and Weight Loss. Skin Present- Dryness. Not Present- Change in Wart/Mole, Hives, Jaundice, New Lesions, Non-Healing Wounds, Rash and Ulcer. HEENT Present- Wears glasses/contact lenses. Not Present- Earache, Hearing Loss, Hoarseness, Nose Bleed, Oral Ulcers, Ringing in the Ears, Seasonal Allergies, Sinus Pain, Sore Throat, Visual Disturbances and Yellow Eyes. Respiratory Not Present- Bloody sputum, Chronic Cough, Difficulty Breathing, Snoring and  Wheezing. Breast Present- Breast Mass. Not Present- Breast Pain, Nipple Discharge and Skin Changes. Cardiovascular Not Present- Chest Pain, Difficulty Breathing Lying Down, Leg Cramps, Palpitations, Rapid Heart Rate, Shortness of Breath and Swelling of Extremities. Gastrointestinal Present- Constipation and Excessive gas. Not Present- Abdominal Pain, Bloating, Bloody Stool, Change in Bowel Habits, Chronic diarrhea, Difficulty Swallowing, Gets full quickly at meals, Hemorrhoids, Indigestion, Nausea, Rectal Pain and Vomiting. Female Genitourinary Not Present- Frequency, Nocturia, Painful Urination, Pelvic Pain and Urgency. Musculoskeletal Present- Joint Pain. Not Present- Back Pain, Joint Stiffness, Muscle Pain, Muscle Weakness and Swelling of Extremities. Neurological Present- Weakness. Not Present- Decreased Memory, Fainting, Headaches, Numbness, Seizures, Tingling, Tremor and Trouble walking. Psychiatric Present- Anxiety. Not Present- Bipolar, Change in Sleep Pattern, Depression, Fearful and Frequent crying.  Vitals Tami Jacobson RMA; 10/19/2016 3:18 PM) 10/19/2016 3:17 PM Weight: 142.6 lb Height: 63in Body Surface Area: 1.67 m Body Mass Index: 25.26 kg/m  Temp.: 19F  Pulse: 73 (Regular)  BP: 150/70 (Sitting, Left Arm, Standard)   Physical Exam  General: Older WF whoalert and generally healthy appearing. She looks a little younger than her stated age. Skin: Inspection and palpation of the skin unremarkable.  Eyes: Conjunctivae white, pupils equal. Face, ears, nose, mouth, and throat: Face - normal. Normal ears and nose. Lips and teeth normal.  Neck: Supple. No mass. Trachea midline. No thyroid mass.  Lymph Nodes: No supraclavicular or cervical adenopathy. No axillary adenopathy.  Breasts: Right - cranial periareolar scar, scar in the LOQ of the right breast, bruise near this scar - site of biopsy Left - No specific mass  Lungs: Normal respiratory  effort. Clear to auscultation and symmetric breath  sounds. Cardiovascular: Regular rate and rythm. Normal auscultation of the heart. No murmur or rub.  Abdomen: Soft. No mass. Liver and spleen not palpable. No tenderness. No hernia. Normal bowel sounds.  Rectal: Not done.  Musculoskeletal/extremities: Normal gait. Good strength and ROM in upper and lower extremities.   Neurologic: Grossly intact to motor and sensory function.   Psychiatric: Has normal mood and affect. Judgement and insight appear normal.    Assessment & Plan  1.  MALIGNANT NEOPLASM OF RIGHT BREAST, STAGE 1, ESTROGEN RECEPTOR POSITIVE (C50.911)  Story: Biopsy: Right breast, 5 o'clock, 09/27/2016 ([id number]) - Invasive lobular ca, ER - 80%, PR -5%, Ki67 -%, Her2neu - negative. Impression: Plan:  1) Med Onc and rad onc consult. She saw Dr. Jana Hakim before. She never had rad tx with her prior breast cancers.  2) Probable right lumpectomy alone (unless med/rad onc think she needs a node biopsy) She will call after her consults and we will proceed with surgery.  Addendum Note(Louann Hopson H. Lucia Gaskins MD; 10/28/2016 10:01 PM)  I got a note from Worthy Flank, [place] at oncology. Ms. Kysar saw Dr. Jana Hakim today and she is ready to go ahead with surgery.  Note: Ms. Aarons has LOBULAR carcinoma (I think some of the stains came back after my note)  She wants to go ahead with lumpectomy and SLNBx. I need to talk to the patient. And I need to talk to Dr. Jana Hakim about the need for a SLNBx in an 83 yo.  Addendum Note(Pleas Carneal H. Lucia Gaskins MD; 10/29/2016 11:11 AM)  Damaris Schooner to Dr. Jana Hakim - we agreed that a SLNBx was not necessary.  Addendum Note(Debborah Alonge H. Lucia Gaskins MD; 11/15/2016 4:08 PM)  It looks like Ms. Insalaco saw Roma Kayser (I'm surprised she made it that far), but declined genetic testing.   3) anti-estrogen,  4) no radiation tx,  5) I spent a long time talking to her about genetics, but she is not interested  at this time.  2. T1c, N0 right breast cancer - 9 o'clock Lumpectomy 06/26/2008. ER - positive, PR - negative, Ki67 - 14%. Her2Neu - neg Dr. Jana Hakim and Tammi Klippel treating oncologist. She did not get radiation tx. Switched to Tamoxifen 2013 Tolerating this well. Wil continue for 5 years - till 2015.  3. T1c, N0 left breast cancer. Lumpectomy (no sentinel node) 06/24/2006. Dr. Modena Morrow did this surgery. 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 Ki67 - 12%. Her2Neu - neg. She did not get radiatio tx.  4. Left breast atypical lobular hyperplasia. Open biopsy by me - 07/02/2011 - shows atypical lobular neoplasia, no cancerous changes.  5. Left ear complaint/vertgo.  She sees Dr. Loletha Grayer. [place]. 6. Hypertension x 10 years 7. GERD. 8. She has seend Dr. Stanford Breed for cardiology 9. Diabetes mellitus - 2013.9 - on Metformin HgbA1C - 09/21/2016 - 7.1      Alphonsa Overall, MD, [facility] Pager: [phone] Office phone:  [phone]

## 2016-11-17 NOTE — Progress Notes (Signed)
[place] Work  Holiday representative received referral from Dietitian for emotional support.  CSW contacted patient at home to offer support and assess form needs.  Patient stated she was "ok" and "would be alright".  CSW discussed the importance of emotional support and informed patient of the support team and support services at [facility].  Patient expressed some interest and was agreeable to CSW mailing contact information for the support team.  CSw will mail support and contact information to patient.  CSW also encouraged patient to call with questions or concerns.    Johnnye Lana, MSW, LCSW, OSW-C Clinical Social Worker [facility] [phone]

## 2016-11-17 NOTE — Progress Notes (Signed)
Water bottle given to drink for DOS with morning meds.  Pt verbalized understanding. NPO otherwise.

## 2016-11-18 ENCOUNTER — Ambulatory Visit (HOSPITAL_BASED_OUTPATIENT_CLINIC_OR_DEPARTMENT_OTHER)
Admission: RE | Admit: 2016-11-18 | Discharge: 2016-11-18 | Disposition: A | Discharge status: Home / Self Care | Payer: Medicare Other | Source: Ambulatory Visit | Attending: Surgery | Admitting: Surgery

## 2016-11-18 ENCOUNTER — Ambulatory Visit (HOSPITAL_BASED_OUTPATIENT_CLINIC_OR_DEPARTMENT_OTHER): Payer: Medicare Other | Admitting: Anesthesiology

## 2016-11-18 ENCOUNTER — Encounter (HOSPITAL_BASED_OUTPATIENT_CLINIC_OR_DEPARTMENT_OTHER)
Admission: RE | Disposition: A | Discharge status: Home / Self Care | Payer: Self-pay | Source: Ambulatory Visit | Attending: Surgery

## 2016-11-18 ENCOUNTER — Encounter (HOSPITAL_BASED_OUTPATIENT_CLINIC_OR_DEPARTMENT_OTHER): Payer: Self-pay | Admitting: Anesthesiology

## 2016-11-18 ENCOUNTER — Ambulatory Visit
Admission: RE | Admit: 2016-11-18 | Discharge: 2016-11-18 | Disposition: A | Discharge status: Home / Self Care | Payer: Medicare Other | Source: Ambulatory Visit | Attending: Surgery | Admitting: Surgery

## 2016-11-18 DIAGNOSIS — Z170 Estrogen receptor positive status [ER+]: Principal | ICD-10-CM

## 2016-11-18 DIAGNOSIS — I10 Essential (primary) hypertension: Secondary | ICD-10-CM | POA: Diagnosis not present

## 2016-11-18 DIAGNOSIS — Z885 Allergy status to narcotic agent status: Secondary | ICD-10-CM | POA: Insufficient documentation

## 2016-11-18 DIAGNOSIS — Z888 Allergy status to other drugs, medicaments and biological substances status: Secondary | ICD-10-CM | POA: Diagnosis not present

## 2016-11-18 DIAGNOSIS — R928 Other abnormal and inconclusive findings on diagnostic imaging of breast: Secondary | ICD-10-CM | POA: Diagnosis not present

## 2016-11-18 DIAGNOSIS — C50311 Malignant neoplasm of lower-inner quadrant of right female breast: Secondary | ICD-10-CM | POA: Diagnosis not present

## 2016-11-18 DIAGNOSIS — K219 Gastro-esophageal reflux disease without esophagitis: Secondary | ICD-10-CM | POA: Insufficient documentation

## 2016-11-18 DIAGNOSIS — Z881 Allergy status to other antibiotic agents status: Secondary | ICD-10-CM | POA: Insufficient documentation

## 2016-11-18 DIAGNOSIS — Z853 Personal history of malignant neoplasm of breast: Secondary | ICD-10-CM | POA: Insufficient documentation

## 2016-11-18 DIAGNOSIS — D0501 Lobular carcinoma in situ of right breast: Secondary | ICD-10-CM | POA: Diagnosis not present

## 2016-11-18 DIAGNOSIS — E785 Hyperlipidemia, unspecified: Secondary | ICD-10-CM | POA: Diagnosis not present

## 2016-11-18 DIAGNOSIS — C50911 Malignant neoplasm of unspecified site of right female breast: Secondary | ICD-10-CM

## 2016-11-18 DIAGNOSIS — Z8041 Family history of malignant neoplasm of ovary: Secondary | ICD-10-CM | POA: Insufficient documentation

## 2016-11-18 DIAGNOSIS — Z7982 Long term (current) use of aspirin: Secondary | ICD-10-CM | POA: Insufficient documentation

## 2016-11-18 DIAGNOSIS — E039 Hypothyroidism, unspecified: Secondary | ICD-10-CM | POA: Diagnosis not present

## 2016-11-18 DIAGNOSIS — E1151 Type 2 diabetes mellitus with diabetic peripheral angiopathy without gangrene: Secondary | ICD-10-CM | POA: Diagnosis not present

## 2016-11-18 DIAGNOSIS — R011 Cardiac murmur, unspecified: Secondary | ICD-10-CM | POA: Diagnosis not present

## 2016-11-18 DIAGNOSIS — Z79899 Other long term (current) drug therapy: Secondary | ICD-10-CM | POA: Insufficient documentation

## 2016-11-18 DIAGNOSIS — Z7984 Long term (current) use of oral hypoglycemic drugs: Secondary | ICD-10-CM | POA: Diagnosis not present

## 2016-11-18 DIAGNOSIS — Z91040 Latex allergy status: Secondary | ICD-10-CM | POA: Insufficient documentation

## 2016-11-18 DIAGNOSIS — E119 Type 2 diabetes mellitus without complications: Secondary | ICD-10-CM | POA: Diagnosis not present

## 2016-11-18 HISTORY — PX: BREAST LUMPECTOMY WITH RADIOACTIVE SEED LOCALIZATION: SHX6424

## 2016-11-18 HISTORY — DX: Malignant neoplasm of unspecified site of right female breast: C50.911

## 2016-11-18 HISTORY — DX: Hyperesthesia: R20.3

## 2016-11-18 HISTORY — DX: Type 2 diabetes mellitus without complications: E11.9

## 2016-11-18 HISTORY — DX: Other seasonal allergic rhinitis: J30.2

## 2016-11-18 LAB — GLUCOSE, CAPILLARY
Glucose-Capillary: 167 mg/dL — ABNORMAL HIGH (ref 65–99)
Glucose-Capillary: 212 mg/dL — ABNORMAL HIGH (ref 65–99)

## 2016-11-18 SURGERY — BREAST LUMPECTOMY WITH RADIOACTIVE SEED LOCALIZATION
Anesthesia: General | Site: Breast | Laterality: Right

## 2016-11-18 MED ORDER — ACETAMINOPHEN 500 MG PO TABS
1000.0000 mg | ORAL_TABLET | ORAL | Status: AC
  Administered 2016-11-18: 1000 mg via ORAL

## 2016-11-18 MED ORDER — DEXAMETHASONE SODIUM PHOSPHATE 10 MG/ML IJ SOLN
INTRAMUSCULAR | Status: AC
Start: 2016-11-18 — End: 2016-11-18
  Filled 2016-11-18: qty 1

## 2016-11-18 MED ORDER — ONDANSETRON HCL 4 MG/2ML IJ SOLN
INTRAMUSCULAR | Status: DC | PRN
  Administered 2016-11-18: 4 mg via INTRAVENOUS

## 2016-11-18 MED ORDER — ACETAMINOPHEN 160 MG/5ML PO SOLN: 325.0000 mg | ORAL | Status: DC | PRN

## 2016-11-18 MED ORDER — MEPERIDINE HCL 25 MG/ML IJ SOLN: 6.2500 mg | INTRAMUSCULAR | Status: DC | PRN

## 2016-11-18 MED ORDER — EPHEDRINE SULFATE-NACL 50-0.9 MG/10ML-% IV SOSY
PREFILLED_SYRINGE | INTRAVENOUS | Status: DC | PRN
  Administered 2016-11-18: 10 mg via INTRAVENOUS

## 2016-11-18 MED ORDER — FENTANYL CITRATE (PF) 100 MCG/2ML IJ SOLN: 25.0000 ug | INTRAMUSCULAR | Status: DC | PRN

## 2016-11-18 MED ORDER — BUPIVACAINE-EPINEPHRINE 0.25% -1:200000 IJ SOLN
INTRAMUSCULAR | Status: DC | PRN
  Administered 2016-11-18: 20 mL

## 2016-11-18 MED ORDER — ACETAMINOPHEN 325 MG PO TABS: 325.0000 mg | ORAL_TABLET | ORAL | Status: DC | PRN

## 2016-11-18 MED ORDER — LIDOCAINE 2% (20 MG/ML) 5 ML SYRINGE
INTRAMUSCULAR | Status: AC
  Filled 2016-11-18: qty 5

## 2016-11-18 MED ORDER — GABAPENTIN 300 MG PO CAPS
ORAL_CAPSULE | ORAL | Status: AC
  Filled 2016-11-18: qty 1

## 2016-11-18 MED ORDER — GABAPENTIN 300 MG PO CAPS
300.0000 mg | ORAL_CAPSULE | ORAL | Status: AC
  Administered 2016-11-18: 300 mg via ORAL

## 2016-11-18 MED ORDER — ONDANSETRON HCL 4 MG/2ML IJ SOLN: 4.0000 mg | Freq: Once | INTRAMUSCULAR | Status: DC | PRN

## 2016-11-18 MED ORDER — FENTANYL CITRATE (PF) 100 MCG/2ML IJ SOLN
50.0000 ug | INTRAMUSCULAR | Status: DC | PRN
  Administered 2016-11-18: 50 ug via INTRAVENOUS

## 2016-11-18 MED ORDER — PROPOFOL 10 MG/ML IV BOLUS
INTRAVENOUS | Status: DC | PRN
  Administered 2016-11-18: 100 mg via INTRAVENOUS

## 2016-11-18 MED ORDER — FENTANYL CITRATE (PF) 100 MCG/2ML IJ SOLN
INTRAMUSCULAR | Status: AC
  Filled 2016-11-18: qty 2

## 2016-11-18 MED ORDER — DEXAMETHASONE SODIUM PHOSPHATE 4 MG/ML IJ SOLN
INTRAMUSCULAR | Status: DC | PRN
  Administered 2016-11-18: 10 mg via INTRAVENOUS

## 2016-11-18 MED ORDER — CHLORHEXIDINE GLUCONATE CLOTH 2 % EX PADS: 6.0000 | MEDICATED_PAD | Freq: Once | CUTANEOUS | Status: DC

## 2016-11-18 MED ORDER — LACTATED RINGERS IV SOLN
INTRAVENOUS | Status: DC
  Administered 2016-11-18: 10:00:00 via INTRAVENOUS

## 2016-11-18 MED ORDER — TRAMADOL HCL 50 MG PO TABS: 50.0000 mg | ORAL_TABLET | Freq: Four times a day (QID) | ORAL | 1 refills | Status: DC | PRN

## 2016-11-18 MED ORDER — ACETAMINOPHEN 500 MG PO TABS
ORAL_TABLET | ORAL | Status: AC
  Filled 2016-11-18: qty 2

## 2016-11-18 MED ORDER — SCOPOLAMINE 1 MG/3DAYS TD PT72: 1.0000 | MEDICATED_PATCH | Freq: Once | TRANSDERMAL | Status: DC | PRN

## 2016-11-18 MED ORDER — BUPIVACAINE-EPINEPHRINE (PF) 0.25% -1:200000 IJ SOLN
INTRAMUSCULAR | Status: AC
  Filled 2016-11-18: qty 30

## 2016-11-18 MED ORDER — ONDANSETRON HCL 4 MG/2ML IJ SOLN
INTRAMUSCULAR | Status: AC
Start: 2016-11-18 — End: 2016-11-18
  Filled 2016-11-18: qty 2

## 2016-11-18 MED ORDER — VANCOMYCIN HCL IN DEXTROSE 1-5 GM/200ML-% IV SOLN
INTRAVENOUS | Status: AC
  Filled 2016-11-18: qty 200

## 2016-11-18 MED ORDER — MIDAZOLAM HCL 2 MG/2ML IJ SOLN: 1.0000 mg | INTRAMUSCULAR | Status: DC | PRN

## 2016-11-18 MED ORDER — LIDOCAINE 2% (20 MG/ML) 5 ML SYRINGE
INTRAMUSCULAR | Status: DC | PRN
  Administered 2016-11-18: 100 mg via INTRAVENOUS

## 2016-11-18 MED ORDER — VANCOMYCIN HCL IN DEXTROSE 1-5 GM/200ML-% IV SOLN
1000.0000 mg | INTRAVENOUS | Status: AC
  Administered 2016-11-18: 1000 mg via INTRAVENOUS

## 2016-11-18 MED ORDER — 0.9 % SODIUM CHLORIDE (POUR BTL) OPTIME
TOPICAL | Status: DC | PRN
  Administered 2016-11-18: 1000 mL

## 2016-11-18 SURGICAL SUPPLY — 51 items
BENZOIN TINCTURE PRP APPL 2/3 (GAUZE/BANDAGES/DRESSINGS) IMPLANT
BINDER BREAST LRG (GAUZE/BANDAGES/DRESSINGS) ×3 IMPLANT
BINDER BREAST MEDIUM (GAUZE/BANDAGES/DRESSINGS) IMPLANT
BINDER BREAST XLRG (GAUZE/BANDAGES/DRESSINGS) IMPLANT
BINDER BREAST XXLRG (GAUZE/BANDAGES/DRESSINGS) IMPLANT
BLADE HEX COATED 2.75 (ELECTRODE) IMPLANT
BLADE SURG 10 STRL SS (BLADE) ×3 IMPLANT
BLADE SURG 15 STRL LF DISP TIS (BLADE) ×1 IMPLANT
BLADE SURG 15 STRL SS (BLADE) ×2
CANISTER SUC SOCK COL 7IN (MISCELLANEOUS) IMPLANT
CANISTER SUCT 1200ML W/VALVE (MISCELLANEOUS) ×3 IMPLANT
CHLORAPREP W/TINT 26ML (MISCELLANEOUS) ×3 IMPLANT
CLIP VESOCCLUDE SM WIDE 6/CT (CLIP) IMPLANT
CLOSURE WOUND 1/4X4 (GAUZE/BANDAGES/DRESSINGS)
COVER BACK TABLE 60X90IN (DRAPES) ×3 IMPLANT
COVER MAYO STAND STRL (DRAPES) ×3 IMPLANT
COVER PROBE W GEL 5X96 (DRAPES) ×3 IMPLANT
DECANTER SPIKE VIAL GLASS SM (MISCELLANEOUS) IMPLANT
DERMABOND ADVANCED (GAUZE/BANDAGES/DRESSINGS) ×2
DERMABOND ADVANCED .7 DNX12 (GAUZE/BANDAGES/DRESSINGS) ×1 IMPLANT
DEVICE DUBIN W/COMP PLATE 8390 (MISCELLANEOUS) ×3 IMPLANT
DRAPE LAPAROTOMY 100X72 PEDS (DRAPES) ×3 IMPLANT
DRAPE UTILITY XL STRL (DRAPES) ×3 IMPLANT
DRSG PAD ABDOMINAL 8X10 ST (GAUZE/BANDAGES/DRESSINGS) ×3 IMPLANT
ELECT COATED BLADE 2.86 ST (ELECTRODE) ×3 IMPLANT
ELECT REM PT RETURN 9FT ADLT (ELECTROSURGICAL) ×3
ELECTRODE REM PT RTRN 9FT ADLT (ELECTROSURGICAL) ×1 IMPLANT
GAUZE SPONGE 4X4 12PLY STRL (GAUZE/BANDAGES/DRESSINGS) ×3 IMPLANT
GAUZE SPONGE 4X4 12PLY STRL LF (GAUZE/BANDAGES/DRESSINGS) IMPLANT
GLOVE SURG SIGNA 7.5 PF LTX (GLOVE) ×3 IMPLANT
GOWN STRL REUS W/ TWL LRG LVL3 (GOWN DISPOSABLE) ×1 IMPLANT
GOWN STRL REUS W/ TWL XL LVL3 (GOWN DISPOSABLE) ×1 IMPLANT
GOWN STRL REUS W/TWL LRG LVL3 (GOWN DISPOSABLE) ×2
GOWN STRL REUS W/TWL XL LVL3 (GOWN DISPOSABLE) ×2
KIT MARKER MARGIN INK (KITS) ×3 IMPLANT
NEEDLE HYPO 25X1 1.5 SAFETY (NEEDLE) ×3 IMPLANT
NS IRRIG 1000ML POUR BTL (IV SOLUTION) IMPLANT
PACK BASIN DAY SURGERY FS (CUSTOM PROCEDURE TRAY) ×3 IMPLANT
PENCIL BUTTON HOLSTER BLD 10FT (ELECTRODE) ×3 IMPLANT
SHEET MEDIUM DRAPE 40X70 STRL (DRAPES) IMPLANT
SLEEVE SCD COMPRESS KNEE MED (MISCELLANEOUS) ×3 IMPLANT
SPONGE LAP 18X18 X RAY DECT (DISPOSABLE) ×3 IMPLANT
STRIP CLOSURE SKIN 1/4X4 (GAUZE/BANDAGES/DRESSINGS) IMPLANT
SUT MNCRL AB 4-0 PS2 18 (SUTURE) ×3 IMPLANT
SUT VICRYL 3-0 CR8 SH (SUTURE) ×3 IMPLANT
SYR CONTROL 10ML LL (SYRINGE) ×3 IMPLANT
TOWEL OR 17X24 6PK STRL BLUE (TOWEL DISPOSABLE) ×3 IMPLANT
TOWEL OR NON WOVEN STRL DISP B (DISPOSABLE) ×3 IMPLANT
TUBE CONNECTING 20'X1/4 (TUBING) ×1
TUBE CONNECTING 20X1/4 (TUBING) ×2 IMPLANT
YANKAUER SUCT BULB TIP NO VENT (SUCTIONS) ×3 IMPLANT

## 2016-11-18 NOTE — Anesthesia Postprocedure Evaluation (Signed)
Anesthesia Post Note  Patient: Tami Jacobson  Procedure(s) Performed: Procedure(s) (LRB): RIGHT BREAST LUMPECTOMY WITH RADIOACTIVE SEED LOCALIZATION ERAS PATHWAY (Right)     Patient location during evaluation: PACU Anesthesia Type: General Level of consciousness: awake Pain management: pain level controlled Vital Signs Assessment: post-procedure vital signs reviewed and stable Respiratory status: spontaneous breathing Cardiovascular status: stable Postop Assessment: no signs of nausea or vomiting Anesthetic complications: no    Last Vitals:  Vitals:   11/18/16 1130 11/18/16 1145  BP: (!) 141/52 (!) 142/54  Pulse: (!) 58 61  Resp: 14 16  Temp:    SpO2: 100% 96%    Last Pain:  Vitals:   11/18/16 1115  TempSrc:   PainSc: 0-No pain   Pain Goal:                 [name] JR,JOHN [name]

## 2016-11-18 NOTE — Op Note (Addendum)
11/18/2016  11:02 AM  PATIENT:  Tami Jacobson DOB: 11-22-1927 MRN: [id number]  PREOP DIAGNOSIS:   right breast cancer  POSTOP DIAGNOSIS:    Right breast cancer, 5 o'clock position (T1, N0)  PROCEDURE:   Procedure(s): RIGHT BREAST LUMPECTOMY WITH RADIOACTIVE SEED LOCALIZATION  SURGEON:   Alphonsa Overall, M.D.  ANESTHESIA:   general  Anesthesiologist: Lyn Hollingshead, MD CRNA: Lyndee Leo, CRNA  General  EBL:  minimal  ml  DRAINS:  none   LOCAL MEDICATIONS USED:   20 cc 1/4% marcaine  SPECIMEN:   Left breast lumpectomy (6 color paint kit), superior margin (suture anterior), inferior margin (suture anterior)  COUNTS CORRECT:  YES  INDICATIONS FOR PROCEDURE:  Tami Jacobson is a 83 y.o. (DOB: Sep 02, 1927) white female whose primary care physician is Binnie Rail, MD and comes for right breast lumpectomy.   She has seen Dr. Jana Hakim for her breast cancer.  She has a history of having a prior right breast cancer in 06/2008 and a prior left breast cancer in 06/2006.  She had lumpectomies for both and did not get radiation therapy.   So this is her third breast cancer, her second on the right.  The options for breast cancer treatment have been discussed with the patient. She elected to proceed with lumpectomy.  At her age and with the size of the breast cancer, I am not going to do a sentinel lymph node biopsy.     The indications and potential complications of surgery were explained to the patient. Potential complications include, but are not limited to, bleeding, infection, the need for further surgery, and nerve injury.     She had a I131 seed placed on 11/17/2016 in her right breast at [facility].  I confirmed the presence of the I131 seed in the pre op area using the Neoprobe.  The seed is in the 5 o'clock position of the right breast.   [Note:  There were no specimen mammograms]  OPERATIVE NOTE:   The patient was taken to room # 8 at [facility] where she  underwent a general anesthesia  supervised by Anesthesiologist: Lyn Hollingshead, MD CRNA: Lyndee Leo, CRNA. Her right breast and axilla were prepped with  ChloraPrep and sterilely draped.    A time-out and the surgical check list was reviewed.    I turned attention to the cancer which was about at the 5 o'clock position of the right breast.   I used the Neoprobe to identify the I131 seed.  I tried to excise an area around the tumor of at least 1 cm.    I excised this block of breast tissue approximately 3 cm by 4 cm  in diameter.   I painted the lumpectomy specimen with the 6 color paint kit and did a specimen mammogram which confirmed the mass, clip, and the seed were all in the right position in the specimen.  I took additional superior and inferior margins.  I painted these specimens and put a suture anteriorly.  The specimen was sent to pathology who called back to confirm that they have the seed and the specimen.   I then irrigated the wound with saline. I infiltrated approximately 20 mL of 1/4% Marcaine between the incisions. I did not place clips because there was no plan for radiation therpy.   I then closed the wound in layers using 3-0 Vicryl sutures for the deep layer. At the skin, I closed the incision with  a 4-0 Monocryl suture. The incision was then painted with Dermabond.  She had gauze place over the wounds and placed in a breast binder.   The patient tolerated the procedure well, was transported to the recovery room in good condition. Sponge and needle count were correct at the end of the case.   Final pathology is pending.   Alphonsa Overall, MD, [facility] Pager: [phone] Office phone:  [phone]

## 2016-11-18 NOTE — Discharge Instructions (Signed)
[facility] - DISCHARGE INSTRUCTIONS TO PATIENT  Activity:  Driving - May drive in 1 to 3 days, if doing well   Lifting - No lifting more thabn 15 pounds for one week, then no limit.  Wound Care:   Leave bandage for one week, then remove bandage and shower.  Diet:  As tolerated.  Follow up appointment:  Call Dr. Pollie Friar office [facility]) at [phone] for an appointment in 2 to 4 weeks.  Medications and dosages:  Resume your home medications.  You have a prescription for:  Tramadol  Call Dr. Lucia Gaskins or his office  [phone]) if you have:  Temperature greater than 100.4,  Persistent nausea and vomiting,  Severe uncontrolled pain,  Redness, tenderness, or signs of infection (pain, swelling, redness, odor or green/yellow discharge around the site),  Difficulty breathing, headache or visual disturbances,  Any other questions or concerns you may have after discharge.  In an emergency, call 911 or go to an Emergency Department at a nearby hospital.   Post Anesthesia Home Care Instructions  Activity: Get plenty of rest for the remainder of the day. A responsible individual must stay with you for 24 hours following the procedure.  For the next 24 hours, DO NOT: -Drive a car -Paediatric nurse -Drink alcoholic beverages -Take any medication unless instructed by your physician -Make any legal decisions or sign important papers.  Meals: Start with liquid foods such as gelatin or soup. Progress to regular foods as tolerated. Avoid greasy, spicy, heavy foods. If nausea and/or vomiting occur, drink only clear liquids until the nausea and/or vomiting subsides. Call your physician if vomiting continues.  Special Instructions/Symptoms: Your throat may feel dry or sore from the anesthesia or the breathing tube placed in your throat during surgery. If this causes discomfort, gargle with warm salt water. The discomfort should disappear within 24  hours.  If you had a scopolamine patch placed behind your ear for the management of post- operative nausea and/or vomiting:  1. The medication in the patch is effective for 72 hours, after which it should be removed.  Wrap patch in a tissue and discard in the trash. Wash hands thoroughly with soap and water. 2. You may remove the patch earlier than 72 hours if you experience unpleasant side effects which may include dry mouth, dizziness or visual disturbances. 3. Avoid touching the patch. Wash your hands with soap and water after contact with the patch.

## 2016-11-18 NOTE — Interval H&P Note (Signed)
History and Physical Interval Note:  11/18/2016 9:25 AM  Tami Jacobson  has presented today for surgery, with the diagnosis of right breast cancer  The various methods of treatment have been discussed with the patient and family.  Seed placed yesterday.  Gabriel Earing, friend, here with patient today.  After consideration of risks, benefits and other options for treatment, the patient has consented to  Procedure(s) with comments: RIGHT BREAST LUMPECTOMY WITH RADIOACTIVE SEED LOCALIZATION ERAS PATHWAY (Right) - ERAS PATHWAY as a surgical intervention .  The patient's history has been reviewed, patient examined, no change in status, stable for surgery.  I have reviewed the patient's chart and labs.  Questions were answered to the patient's satisfaction.     Lindon Kiel H

## 2016-11-18 NOTE — Anesthesia Preprocedure Evaluation (Addendum)
Anesthesia Evaluation  Patient identified by MRN, date of birth, ID band Patient awake    Reviewed: Allergy & Precautions, NPO status , Patient's Chart, lab work & pertinent test results  Airway Mallampati: I       Dental no notable dental hx. (+) Teeth Intact   Pulmonary neg pulmonary ROS,    Pulmonary exam normal breath sounds clear to auscultation       Cardiovascular hypertension, Pt. on home beta blockers + Peripheral Vascular Disease  Normal cardiovascular exam Rhythm:Regular Rate:Normal  2018 Carotid dopplers show stable 1-39% disease in RICA and LICA   Neuro/Psych negative neurological ROS  negative psych ROS   GI/Hepatic Neg liver ROS, GERD  Medicated,  Endo/Other  diabetes, Type 2, Oral Hypoglycemic Agents  Renal/GU   negative genitourinary   Musculoskeletal   Abdominal Normal abdominal exam  (+)   Peds  Hematology negative hematology ROS (+)   Anesthesia Other Findings Tami Jacobson  Gated Spect Myocardial Perfusion Imaging with Regadenoson Stress 1 Day Rest/Stress Protocol  Order# [id number]  Reading physician: Pixie Casino, MD Ordering physician: Lelon Perla, MD Study date: 05/13/15 Patient Information   Name MRN Description Tami Jacobson [id number] 83 y.o. Female Result Notes   Notes Recorded by Tamsen Snider on 05/15/2015 at 3:26 PM Pt aware of stress test results ------  Notes Recorded by Tamsen Snider on 05/15/2015 at 11:29 AM Left message on machine for pt to contact the office.  ------  Notes Recorded by Lelon Perla, MD on 05/13/2015 at 5:57 PM Normal Kirk Ruths   Vitals   Height Weight BMI (Calculated) 5\' 3"  (1.6 m) 143 lb (64.9 kg) 25.4 Study Highlights    The left ventricular ejection fraction is hyperdynamic (>65%).  Nuclear stress EF: 72%.  This is a low risk study.   Normal perfusion. EF 72% with normal wall motion. This is a low risk  study.  Nuclear History     Reproductive/Obstetrics negative OB ROS                            Anesthesia Physical Anesthesia Plan  ASA: III  Anesthesia Plan: General   Post-op Pain Management:    Induction: Intravenous  PONV Risk Score and Plan: 3 and Ondansetron, Dexamethasone, Midazolam, Propofol infusion and Treatment may vary due to age or medical condition  Airway Management Planned: LMA  Additional Equipment:   Intra-op Plan:   Post-operative Plan:   Informed Consent: I have reviewed the patients History and Physical, chart, labs and discussed the procedure including the risks, benefits and alternatives for the proposed anesthesia with the patient or authorized representative who has indicated his/her understanding and acceptance.   Dental advisory given  Plan Discussed with: CRNA and Surgeon  Anesthesia Plan Comments:       Anesthesia Quick Evaluation

## 2016-11-18 NOTE — Transfer of Care (Signed)
Immediate Anesthesia Transfer of Care Note  Patient: Tami Jacobson  Procedure(s) Performed: Procedure(s) with comments: RIGHT BREAST LUMPECTOMY WITH RADIOACTIVE SEED LOCALIZATION ERAS PATHWAY (Right) - ERAS PATHWAY  Patient Location: PACU  Anesthesia Type:General  Level of Consciousness: awake, sedated and patient cooperative  Airway & Oxygen Therapy: Patient Spontanous Breathing and Patient connected to face mask oxygen  Post-op Assessment: Report given to RN and Post -op Vital signs reviewed and stable  Post vital signs: Reviewed and stable  Last Vitals:  Vitals:   11/18/16 0850  BP: (!) 156/70  Pulse: 67  Resp: 18  Temp: 36.9 C  SpO2: 100%    Last Pain:  Vitals:   11/18/16 0850  TempSrc: Oral  PainSc: 0-No pain         Complications: No apparent anesthesia complications

## 2016-11-18 NOTE — Anesthesia Procedure Notes (Signed)
Procedure Name: LMA Insertion Date/Time: 11/18/2016 9:55 AM Performed by: Lyndee Leo Pre-anesthesia Checklist: Patient identified, Emergency Drugs available, Suction available and Patient being monitored Patient Re-evaluated:Patient Re-evaluated prior to induction Oxygen Delivery Method: Circle system utilized Preoxygenation: Pre-oxygenation with 100% oxygen Induction Type: IV induction Ventilation: Mask ventilation without difficulty LMA: LMA inserted LMA Size: 4.0 Number of attempts: 1 Airway Equipment and Method: Bite block Placement Confirmation: positive ETCO2 Tube secured with: Tape Dental Injury: Teeth and Oropharynx as per pre-operative assessment

## 2016-11-19 ENCOUNTER — Encounter (HOSPITAL_BASED_OUTPATIENT_CLINIC_OR_DEPARTMENT_OTHER): Payer: Self-pay | Admitting: Surgery

## 2016-11-19 NOTE — Progress Notes (Signed)
Location of Breast Cancer: Right Breast Lower Inner Quadrant  FUN after surgery to move forward with the simulation and planning process.  Histology per Pathology Report:                                                          Diagnosis 11-09-16 1. Breast, lumpectomy, Right w/seed - INVASIVE LOBULAR CARCINOMA, GRADE II/III, SPANNING 0.9 CM. - LOBULAR CARCINOMA IN SITU. - THE SURGICAL RESECTION MARGINS ARE NEGATIVE FOR INVASIVE CARCINOMA. - SEE ONCOLOGY TABLE BELOW. 2. Breast, excision, Right New Inferior Margin - BENIGN BREAST PARENCHYMA. - THERE IS NO EVIDENCE OF MALIGNANCY. - SEE COMMENT. 3. Breast, excision, Right New Superior Margin - LOBULAR CARCINOMA IN SITU. - SEE COMMENT  Receptor Status: ER(80%+), PR (5%+), Her2-neu (neg. Ratio 1.21), Ki-67(5%)    Diagnosis : 09/27/2016: Breast, right, needle core biopsy, 5 o'clock, 2 cmfn - INVASIVE MAMMARY CARCINOMA.   Receptor Status: ER(80%+), PR (5%+), Her2-neu (neg. Ratio 1.21), Ki-67(5%)  Did patient present with symptoms (if so, please note symptoms) or was this found on screening mammography?: routine mammogram in March, went back in June pain in nipple for 2 weeks,   Past/Anticipated interventions by surgeon, if any:  11-18-16 Dr. Alphonsa Overall RIGHT BREAST LUMPECTOMY WITH RADIOACTIVE SEED,  Dr. Misty Stanley 10/20/16 Past/Anticipated interventions by medical oncology, if any: Chemotherapy : 11-15-16 Dr. Jana Hakim  Hereditary cancer pedigree, Dr. Jana Hakim, last note was 4/17/215, was treated with Arimidex 07/2008-07/2011,then started Tamoxifen  07/2011-07/2013, appt 7/26+/18 at 3pm today  Lymphedema issues, if any:  None  Pain issues, if any:12-02-16  ,None  SAFETY ISSUES: NO  Prior radiation? No  Pacemaker/ICD? No  Is the patient on methotrexate? No  Current Complaints / other details: Married, Gx P1;  Hx Left breast  bx,fibrocystic 07/02/11, Right breast lumpectomy T1c,NO, Left breast ,3//24/2010,  Right  lumpectomy 06/24/2006 , Legally separated Mother MI<Colon cancer,, Sisters breat cancer, Maternal Uncle Heart disease,Brothrs heart disease,     Vitals:   12/02/16 1256  BP: (!) 139/39  Pulse: 66  Resp: 20  Temp: 97.8 F (36.6 C)  TempSrc: Oral  SpO2: 97%  Weight: 138 lb 8 oz (62.8 kg)   Wt Readings from Last 3 Encounters:  12/02/16 138 lb 8 oz (62.8 kg)  11/18/16 141 lb (64 kg)  10/28/16 141 lb 12.8 oz (64.3 kg)

## 2016-11-22 ENCOUNTER — Other Ambulatory Visit: Payer: Self-pay | Admitting: Oncology

## 2016-11-22 ENCOUNTER — Ambulatory Visit (INDEPENDENT_AMBULATORY_CARE_PROVIDER_SITE_OTHER): Payer: Medicare Other

## 2016-11-22 DIAGNOSIS — E538 Deficiency of other specified B group vitamins: Secondary | ICD-10-CM

## 2016-11-22 MED ORDER — CYANOCOBALAMIN 1000 MCG/ML IJ SOLN
1000.0000 ug | Freq: Once | INTRAMUSCULAR | Status: AC
  Administered 2016-11-22: 1000 ug via INTRAMUSCULAR

## 2016-11-22 NOTE — Progress Notes (Unsigned)
[place]  [phone] Fax:([phone]     ID: ALEINA BURGIO DOB: Jul 12, 1927  MR#: [id number]  JYN#:[id number]  Patient Care Team: Binnie Rail, MD as PCP - General (Internal Medicine) Stanford Breed Denice Bors, MD as Consulting Physician (Cardiology) Winnell Bento, Virgie Dad, MD as Consulting Physician (Hematology and Oncology) Lafayette Dragon, MD (Inactive) as Consulting Physician (Gastroenterology) Newton Pigg, MD as Consulting Physician (Obstetrics and Gynecology) Orion Crook, MD (Inactive) as Consulting Physician (Urology) Alphonsa Overall, MD as Consulting Physician (General Surgery) Rozetta Nunnery, MD as Consulting Physician (Otolaryngology) Kyung Rudd, MD as Consulting Physician (Radiation Oncology) Chauncey Cruel, MD OTHER MD:  CHIEF COMPLAINT: Estrogen receptor positive breast cancer  CURRENT TREATMENT: Awaiting definitive surgery   BREAST CANCER HISTORY: The patient had bilateral screening mammography with tomography at the [name] 06/28/2016 showing only post surgical changes in both breasts. The patient had a history of right lateral breast cancer diagnosed in 2010, T1 cN0, status post lumpectomy March of that year, and she also has a history of T1 CN 0 left breast cancer status post lumpectomy in 2008. She had excisional biopsy on the left of what proved to be atypical lobular hyperplasia March 2013.  On 09/23/2016 the patient was evaluated for pain in the inferior right breast and a feeling of lumpiness. Right [facility] found the breast density to be category C. In the right breast there was skin thickening around the areola. This appeared unchanged. On tomography however there was a suggestion of a small spiculated mass in the inferior central right breast measuring 0.5 cm. This was palpable at the 5:00 position 2 cm from the nipple. Targeted ultrasonography confirmed an  irregular mass in this position measuring 0.8 cm. Ultrasound of the right axilla was negative.  Biopsy of this right breast lower inner quadrant mass 09/27/2016 showed ([id number]) invasive lobular carcinoma, E-cadherin negative, grade not stated, estrogen receptor 80% positive, progesterone receptor 5% positive, both with strong staining intensity, with an MIB-1 of 5%, and no HER-2 amplification, the signals ratio being 1.21 and the number per cell 2.30.  The patient's subsequent history is as detailed below.  INTERVAL HISTORY: Her case was also presented in the multidisciplinary breast cancer conference 10/13/2016. At that time a preliminary plan was proposed: Either mastectomy or lumpectomy with anti-estrogens, with consideration of genetics unless already done.  REVIEW OF SYSTEMS:  PAST MEDICAL HISTORY: Past Medical History:  Diagnosis Date  . Arthritis    left hip  . Breast cancer, right ([place]) 11/2016  . Family history of breast cancer   . Family history of colon cancer   . Family history of pancreatic cancer   . GERD (gastroesophageal reflux disease)   . Non-insulin dependent type 2 diabetes mellitus ([place])   . PAC (premature atrial contraction)   . Premature ventricular contraction   . Seasonal allergies   . Sensitive skin   . Vitamin B12 deficiency     PAST SURGICAL HISTORY: Past Surgical History:  Procedure Laterality Date  . ABDOMINAL HYSTERECTOMY  1979   partial  . BREAST BIOPSY  07/02/2011   Procedure: BREAST BIOPSY WITH NEEDLE LOCALIZATION;  Surgeon: Shann Medal, MD;  Location: [facility];  Service: General;  Laterality: Left;  left breast atypical hyperplasia needle localization biopsy  . BREAST LUMPECTOMY Left 07/04/2006  . BREAST LUMPECTOMY Right 06/26/2008  . BREAST LUMPECTOMY WITH RADIOACTIVE SEED LOCALIZATION Right 11/18/2016   Procedure: RIGHT BREAST LUMPECTOMY WITH  RADIOACTIVE SEED LOCALIZATION ERAS PATHWAY;  Surgeon: Alphonsa Overall, MD;  Location: [place];  Service: General;  Laterality: Right;  ERAS PATHWAY  . CATARACT EXTRACTION Bilateral   . CHOLECYSTECTOMY, LAPAROSCOPIC  07/07/2001  . ESOPHAGEAL DILATION  2006  . LAPAROSCOPIC APPENDECTOMY  07/07/2001  . TONSILLECTOMY  1942    FAMILY HISTORY Family History  Problem Relation Age of Onset  . Colon cancer Mother 72  . Heart attack Mother   . Breast cancer Sister 19  . Heart disease Maternal Uncle        CABG  . Breast cancer Sister 61  . Heart disease Brother   . Other Father        suicide  . Breast cancer Sister 28  . Heart disease Sister   . COPD Brother   . Heart disease Brother   . Breast cancer Other 38       niece - brother's daughter  . Pancreatic cancer Other        brother's son, dx in his 68s  The patient's father committed suicide at the age of 39. The patient's mother had a history of colon cancer diagnosed at age 70, she died at the age of 38 from unrelated causes. The patient is 43 sisters. 2 sisters died from breast cancer, 1 diagnosed age 3 diagnosed age 35. A third sister was diagnosed with breast cancer at age 74. The patient has 2 brothers. There is no other history of cancer in the family to the patient's knowledge.  GYNECOLOGIC HISTORY:  GX P1, first live birth age 73. The patient took Premarin alone for nearly 30 years, status post hysterectomy. No LMP recorded. Patient has had a hysterectomy.   SOCIAL HISTORY: She worked as a Network engineer for [employer]. Her husband of more than 20 year, Marcello Moores, "just left me 13 months ago and moved to an assisted living facility.". The patient enters me that her daughter Margarita Grizzle is estranged. The patient lives by herself, with no pets.   ADVANCED DIRECTIVES: Not in place. At the 10/28/2016 visit the patient was given the appropriate documents to complete and notarize at her discretion.   HEALTH MAINTENANCE: Social History  Substance Use Topics  . Smoking status: Never Smoker  . Smokeless tobacco:  Never Used  . Alcohol use No     Colonoscopy:  PAP:  Bone density:   Allergies  Allergen Reactions  . Statins Other (See Comments)    GI UPSET  . Cephalexin Other (See Comments)    UNKNOWN  . Codeine Other (See Comments)    UNKNOWN  . Lansoprazole Other (See Comments)    UNKNOWN  . Latex Other (See Comments)    UNKNOWN   . Levofloxacin Other (See Comments)    UNKNOWN  . Nitrofurantoin Other (See Comments)    UNKNOWN  . Rofecoxib Other (See Comments)    UNKNOWN     Current Outpatient Prescriptions  Medication Sig Dispense Refill  . aspirin EC 81 MG tablet Take 1 tablet (81 mg total) by mouth daily. 90 tablet 3  . blood glucose meter kit and supplies KIT Dispense based on patient and insurance preference. Check sugars once daily and as needed as directed. 1 each 0  . clobetasol cream (TEMOVATE) 7.79 % 1 application as needed. Irritated skin    . cyanocobalamin (,VITAMIN B-12,) 1000 MCG/ML injection Inject 1,000 mcg into the muscle every 30 (thirty) days.     . flecainide (TAMBOCOR) 50 MG tablet Take 1  tablet (50 mg total) by mouth 2 (two) times daily. 180 tablet 3  . metFORMIN (GLUCOPHAGE-XR) 500 MG 24 hr tablet TAKE ONE TABLET BY MOUTH ONCE DAILY WITH  BREAKFAST 90 tablet 1  . metoprolol succinate (TOPROL-XL) 50 MG 24 hr tablet TAKE ONE TABLET BY MOUTH ONCE DAILY WITH OR IMMEDIATELY FOLLOWING A MEAL 30 tablet 11  . naproxen sodium (ANAPROX) 220 MG tablet Take 220 mg by mouth as needed.    Marland Kitchen omeprazole (PRILOSEC) 20 MG capsule Take 1 capsule (20 mg total) by mouth daily. 90 capsule 3  . tamoxifen (NOLVADEX) 20 MG tablet Take 1 tablet (20 mg total) by mouth daily. 90 tablet 12  . traMADol (ULTRAM) 50 MG tablet Take 1 tablet (50 mg total) by mouth every 6 (six) hours as needed for moderate pain. 20 tablet 1   No current facility-administered medications for this visit.     OBJECTIVE:Older white woman in no acute distress  There were no vitals filed for this visit.   There  is no height or weight on file to calculate BMI.   Wt Readings from Last 3 Encounters:  11/18/16 141 lb (64 kg)  10/28/16 141 lb 12.8 oz (64.3 kg)  10/28/16 141 lb (64 kg)      ECOG FS:1 - Symptomatic but completely ambulatory  Ocular: Sclerae unicteric, pupils round and equal Ear-nose-throat: Oropharynx clear and moist Lymphatic: No cervical or supraclavicular adenopathy Lungs no rales or rhonchi Heart regular rate and rhythm Abd soft, nontender, positive bowel sounds MSK no focal spinal tenderness, no joint edema Neuro: non-focal, well-oriented, appropriate affect Breasts: Both breasts are status post lumpectomy. I do not palpate a mass in the right breast. Both axillae are benign.   LAB RESULTS:  CMP     Component Value Date/Time   NA 139 11/17/2016 1415   NA 143 07/13/2013 0854   K 4.3 11/17/2016 1415   K 4.9 07/13/2013 0854   CL 102 11/17/2016 1415   CL 106 07/11/2012 0901   CO2 29 11/17/2016 1415   CO2 29 07/13/2013 0854   GLUCOSE 172 (H) 11/17/2016 1415   GLUCOSE 142 (H) 07/13/2013 0854   GLUCOSE 110 (H) 07/11/2012 0901   BUN 12 11/17/2016 1415   BUN 14.2 07/13/2013 0854   CREATININE 1.10 (H) 11/17/2016 1415   CREATININE 0.9 07/13/2013 0854   CALCIUM 9.5 11/17/2016 1415   CALCIUM 9.8 07/13/2013 0854   PROT 7.0 09/21/2016 0946   PROT 6.7 07/13/2013 0854   ALBUMIN 4.3 09/21/2016 0946   ALBUMIN 3.7 07/13/2013 0854   AST 13 09/21/2016 0946   AST 16 07/13/2013 0854   ALT 11 09/21/2016 0946   ALT 11 07/13/2013 0854   ALKPHOS 49 09/21/2016 0946   ALKPHOS 50 07/13/2013 0854   BILITOT 0.9 09/21/2016 0946   BILITOT 0.66 07/13/2013 0854   GFRNONAA 44 (L) 11/17/2016 1415   GFRAA 51 (L) 11/17/2016 1415    No results found for: TOTALPROTELP, ALBUMINELP, A1GS, A2GS, BETS, BETA2SER, GAMS, MSPIKE, SPEI  No results found for: KPAFRELGTCHN, LAMBDASER, KAPLAMBRATIO  Lab Results  Component Value Date   WBC 4.4 02/10/2016   NEUTROABS 2.9 02/10/2016   HGB 12.0  02/10/2016   HCT 35.0 (L) 02/10/2016   MCV 91.8 02/10/2016   PLT 162.0 02/10/2016      Chemistry      Component Value Date/Time   NA 139 11/17/2016 1415   NA 143 07/13/2013 0854   K 4.3 11/17/2016 1415   K 4.9 07/13/2013  0854   CL 102 11/17/2016 1415   CL 106 07/11/2012 0901   CO2 29 11/17/2016 1415   CO2 29 07/13/2013 0854   BUN 12 11/17/2016 1415   BUN 14.2 07/13/2013 0854   CREATININE 1.10 (H) 11/17/2016 1415   CREATININE 0.9 07/13/2013 0854      Component Value Date/Time   CALCIUM 9.5 11/17/2016 1415   CALCIUM 9.8 07/13/2013 0854   ALKPHOS 49 09/21/2016 0946   ALKPHOS 50 07/13/2013 0854   AST 13 09/21/2016 0946   AST 16 07/13/2013 0854   ALT 11 09/21/2016 0946   ALT 11 07/13/2013 0854   BILITOT 0.9 09/21/2016 0946   BILITOT 0.66 07/13/2013 0854       Lab Results  Component Value Date   LABCA2 18 10/07/2009    No components found for: [id number]  No results for input(s): INR in the last 168 hours.  Urinalysis No results found for: COLORURINE, APPEARANCEUR, LABSPEC, PHURINE, GLUCOSEU, HGBUR, BILIRUBINUR, KETONESUR, PROTEINUR, UROBILINOGEN, NITRITE, LEUKOCYTESUR   STUDIES: Recent studies discussed with patient. She understands the difficulty of detecting lobular breast cancer by routine mammography.  ELIGIBLE FOR AVAILABLE RESEARCH PROTOCOL: no  ASSESSMENT: 83 y.o. [place] woman   (1) status post left lumpectomy March 2008 for a pT1c invasive ductal carcinoma, grade 1, strongly estrogen and progesterone receptor positive, HER-2 not amplified, with a borderline MIB-1  (a) received tamoxifen, but very intermittently  (2) status post right lumpectomy and sentinel lymph node sampling 06/28/2008 for a pT1c pN0, stage IA invasive ductal carcinoma, grade 1, estrogen receptor 96% positive, progesterone receptor negative, HER-2/neu negative, with an MIB-1 of 14%  (a) received anastrozole April 2010 to April 2013  (3) status post excision of an area of lobular  carcinoma in situ, left breast, 07/02/2011  (a) on tamoxifen April 2013 to April 2015.  (4) status post biopsy of the right breast lower inner quadrant 09/27/2016 for a clinical T1a N0 invasive lobular carcinoma, E-cadherin negative, estrogen and progesterone receptor positive, with no HER-2 amplification and an MIB-1 of 5%.  (5) Definitive surgery pending  (6) adjuvant radiation to follow  (7) tamoxifen for 5 years   (8) Genetics testing pending  PLAN: We spent the better part of today's hour-long appointment discussing the biology of breast cancer in general, and the specifics of the patient's tumor in particular. Kira understands this is her third breast cancer and fourth breast surgery. She will benefit from adjuvant radiation to reduce the risk of local recurrence and she will benefit from anti-estrogens even if the tumor proves to be as small as 5 mm, because of risk reduction.  She has a significant family history of breast cancer in addition to her own personal history. She certainly warrants genetics testing and this is being arranged.  She never did take tamoxifen regularly. She took anastrozole for a few years. I think at this point going to anastrozole may be preferable. We discussed the possible toxicities, side effects and complications of this agent in detail. The order has been put in for her.  I expect she will have her surgery mid-August and complete her radiation mid to late September. Accordingly she will see me again in October. We will initiate long-term follow-up at that point.   Shakeisha has a good understanding of the overall plan. She agrees with it. She knows the goal of treatment in her case is cure. She will call with any problems that may develop before her next visit here.  Chauncey Cruel, MD  11/22/2016 5:37 PM Medical Oncology and Hematology [facility] [address] [place], [place] [address] Tel. [phone]    Fax. [phone]

## 2016-11-22 NOTE — Progress Notes (Signed)
Injection given.   Dreamer Carillo J Joycelynn Fritsche, MD  

## 2016-11-23 ENCOUNTER — Telehealth: Payer: Self-pay

## 2016-11-23 NOTE — Telephone Encounter (Signed)
lvm with pt informing her she can resume her Tamoxifen 2 weeks after her surgery has been performed.  Instructed pt to return my call.

## 2016-11-29 DIAGNOSIS — M7062 Trochanteric bursitis, left hip: Secondary | ICD-10-CM | POA: Diagnosis not present

## 2016-12-02 ENCOUNTER — Ambulatory Visit
Admission: RE | Admit: 2016-12-02 | Discharge: 2016-12-02 | Disposition: A | Discharge status: Home / Self Care | Payer: Medicare Other | Source: Ambulatory Visit | Attending: Radiation Oncology | Admitting: Radiation Oncology

## 2016-12-02 ENCOUNTER — Encounter: Payer: Self-pay | Admitting: Radiation Oncology

## 2016-12-02 VITALS — BP 139/39 | HR 66 | Temp 97.8°F | Resp 20 | Wt 138.5 lb

## 2016-12-02 DIAGNOSIS — Z170 Estrogen receptor positive status [ER+]: Principal | ICD-10-CM

## 2016-12-02 DIAGNOSIS — Z9889 Other specified postprocedural states: Secondary | ICD-10-CM | POA: Diagnosis not present

## 2016-12-02 DIAGNOSIS — C50311 Malignant neoplasm of lower-inner quadrant of right female breast: Secondary | ICD-10-CM | POA: Diagnosis not present

## 2016-12-02 DIAGNOSIS — Z853 Personal history of malignant neoplasm of breast: Secondary | ICD-10-CM | POA: Diagnosis not present

## 2016-12-02 DIAGNOSIS — E119 Type 2 diabetes mellitus without complications: Secondary | ICD-10-CM | POA: Diagnosis not present

## 2016-12-02 DIAGNOSIS — Z510 Encounter for antineoplastic radiation therapy: Secondary | ICD-10-CM | POA: Diagnosis not present

## 2016-12-02 DIAGNOSIS — Z86000 Personal history of in-situ neoplasm of breast: Secondary | ICD-10-CM | POA: Diagnosis not present

## 2016-12-02 NOTE — Progress Notes (Signed)
Radiation Oncology         [phone] ________________________________  Name: BRYANDA MIKEL MRN: [id number]  Date: 12/02/2016  DOB: 1927-12-01  CC:Binnie Rail, MD  Alphonsa Overall, MD     REFERRING PHYSICIAN: Alphonsa Overall, MD   DIAGNOSIS: Invasive lobular carcinoma of the right breast, ER/PR pos, Her2-neu neg, with history of bilateral breast cancers.   HISTORY OF PRESENT ILLNESS: Tami Jacobson is a 83 y.o. female with a history of bilateral breast cancers and is s/p lumpectomy in both breasts. Her history includes a left grade 2 ER/PR positive DCIS in 2008 treated with adjuvant Tamoxifen. She developed a new Stage I, T1cN0 grade 1 invasive ductal carcinoma of the right breast in 2010, and she subsequently underwent lumpectomy and continued endocrine blockade with Arimidex. She the had a left lobular carcinoma in situ of the left breast in March 2013 and remained on endocrine therapy until 2015. She was released to routine follow up and her PCP has been managing her mammography and performing breast exams. She had a normal mammogram in March 2018, but presented to her PCP on 09/15/16 with reports of right breast sensitivity lasting 5 days beginning with the lifting of heavy books. She denied palpable lumps or skin changes, and there was no nipple discharge upon exam. PCP did however note palpable firmness and tenderness in nipple-areolar complex. Diagnosticimaging on 09/23/16 showed suspicious 0.8 x 0.5 x 0.4 cm mass in the 5 o'clock position of the right breast, no axillary adenopathy was present. Slight skin thickening of the periareolar right breast appeared chronic, likely related to prior treatment changes.   US guided biopsy was recommended and performed on 09/27/16. Pathology revealed invasive lobular carcinoma, ER/PR positive, Her2-neu negative. Since her visit a few weeks ago, she's undergone right lumpectomy which revealed a grade 29 mm invasive lobular carcinoma with LCIS, the  surgical resection margins were negative and additional margins at the inferior and superior margin were taken, the inferior margin did not reveal any additional malignancy,however the superior margin revealed LCIS. Her tumor was positive for ER and PR 95%). HER-2 was negative, Ki-67 was 5%. She comes today to discuss moving forward with adjuvant therapy.  PREVIOUS RADIATION THERAPY: No   PAST MEDICAL HISTORY:  Past Medical History:  Diagnosis Date  . Arthritis    left hip  . Breast cancer, right ([place]) 11/2016  . Family history of breast cancer   . Family history of colon cancer   . Family history of pancreatic cancer   . GERD (gastroesophageal reflux disease)   . Non-insulin dependent type 2 diabetes mellitus ([place])   . PAC (premature atrial contraction)   . Premature ventricular contraction   . Seasonal allergies   . Sensitive skin   . Vitamin B12 deficiency        PAST SURGICAL HISTORY: Past Surgical History:  Procedure Laterality Date  . ABDOMINAL HYSTERECTOMY  1979   partial  . BREAST BIOPSY  07/02/2011   Procedure: BREAST BIOPSY WITH NEEDLE LOCALIZATION;  Surgeon: Shann Medal, MD;  Location: [facility];  Service: General;  Laterality: Left;  left breast atypical hyperplasia needle localization biopsy  . BREAST LUMPECTOMY Left 07/04/2006  . BREAST LUMPECTOMY Right 06/26/2008  . BREAST LUMPECTOMY WITH RADIOACTIVE SEED LOCALIZATION Right 11/18/2016   Procedure: RIGHT BREAST LUMPECTOMY WITH RADIOACTIVE SEED LOCALIZATION ERAS PATHWAY;  Surgeon: Alphonsa Overall, MD;  Location: [facility];  Service: General;  Laterality: Right;  ERAS PATHWAY  . CATARACT EXTRACTION  Bilateral   . CHOLECYSTECTOMY, LAPAROSCOPIC  07/07/2001  . ESOPHAGEAL DILATION  2006  . LAPAROSCOPIC APPENDECTOMY  07/07/2001  . TONSILLECTOMY  1942     FAMILY HISTORY:  Family History  Problem Relation Age of Onset  . Colon cancer Mother 49  . Heart attack Mother   . Breast cancer Sister 18  . Heart  disease Maternal Uncle        CABG  . Breast cancer Sister 46  . Heart disease Brother   . Other Father        suicide  . Breast cancer Sister 45  . Heart disease Sister   . COPD Brother   . Heart disease Brother   . Breast cancer Other 63       niece - brother's daughter  . Pancreatic cancer Other        brother's son, dx in his 53s     SOCIAL HISTORY:  reports that she has never smoked. She has never used smokeless tobacco. She reports that she does not drink alcohol or use drugs. The patient lives in [place]. She remains independent and active in her social group.   ALLERGIES: Statins; Cephalexin; Codeine; Lansoprazole; Latex; Levofloxacin; Nitrofurantoin; and Rofecoxib   MEDICATIONS:  Current Outpatient Prescriptions  Medication Sig Dispense Refill  . aspirin EC 81 MG tablet Take 1 tablet (81 mg total) by mouth daily. 90 tablet 3  . blood glucose meter kit and supplies KIT Dispense based on patient and insurance preference. Check sugars once daily and as needed as directed. 1 each 0  . clobetasol cream (TEMOVATE) 5.28 % 1 application as needed. Irritated skin    . cyanocobalamin (,VITAMIN B-12,) 1000 MCG/ML injection Inject 1,000 mcg into the muscle every 30 (thirty) days.     . flecainide (TAMBOCOR) 50 MG tablet Take 1 tablet (50 mg total) by mouth 2 (two) times daily. 180 tablet 3  . metFORMIN (GLUCOPHAGE-XR) 500 MG 24 hr tablet TAKE ONE TABLET BY MOUTH ONCE DAILY WITH  BREAKFAST 90 tablet 1  . metoprolol succinate (TOPROL-XL) 50 MG 24 hr tablet TAKE ONE TABLET BY MOUTH ONCE DAILY WITH OR IMMEDIATELY FOLLOWING A MEAL 30 tablet 11  . naproxen sodium (ANAPROX) 220 MG tablet Take 220 mg by mouth as needed.    Marland Kitchen omeprazole (PRILOSEC) 20 MG capsule Take 1 capsule (20 mg total) by mouth daily. 90 capsule 3  . traMADol (ULTRAM) 50 MG tablet Take 1 tablet (50 mg total) by mouth every 6 (six) hours as needed for moderate pain. 20 tablet 1   No current facility-administered  medications for this encounter.      REVIEW OF SYSTEMS: On review of systems, the patient reports that she is doing well overall. She denies any chest pain, shortness of breath, cough, fevers, chills, night sweats, unintended weight changes. She has recovered well since surgery and has not had any infection. She denies any bowel or bladder disturbances, and denies abdominal pain, nausea or vomiting. She denies any new musculoskeletal or joint aches or pains, new skin lesions or concerns. A complete review of systems is obtained and is otherwise negative.    PHYSICAL EXAM:  Wt Readings from Last 3 Encounters:  11/18/16 141 lb (64 kg)  10/28/16 141 lb 12.8 oz (64.3 kg)  10/28/16 141 lb (64 kg)   Temp Readings from Last 3 Encounters:  11/18/16 97.8 F (36.6 C) (Oral)  10/28/16 98 F (36.7 C) (Oral)  10/28/16 98 F (36.7 C) (Oral)  BP Readings from Last 3 Encounters:  11/18/16 (!) 147/52  10/28/16 (!) 138/43  10/28/16 (!) 138/43   Pulse Readings from Last 3 Encounters:  11/18/16 64  10/28/16 84  10/28/16 84      In general this is a well appearing Caucasian female in no acute distress. She's alert and oriented x4 and appropriate throughout the examination. Cardiopulmonary assessment is negative for acute distress and she exhibits normal effort. The right breast is assessed and reveals a well healing incision with Dermabond intact. No evidence of cellulitic streaking bleeding or separation is noted.   ECOG = 0  0 - Asymptomatic (Fully active, able to carry on all predisease activities without restriction)  1 - Symptomatic but completely ambulatory (Restricted in physically strenuous activity but ambulatory and able to carry out work of a light or sedentary nature. For example, light housework, office work)  2 - Symptomatic, <50% in bed during the day (Ambulatory and capable of all self care but unable to carry out any work activities. Up and about more than 50% of waking  hours)  3 - Symptomatic, >50% in bed, but not bedbound (Capable of only limited self-care, confined to bed or chair 50% or more of waking hours)  4 - Bedbound (Completely disabled. Cannot carry on any self-care. Totally confined to bed or chair)  5 - Death   Eustace Pen MM, Creech RH, Tormey DC, et al. [phone]). "Toxicity and response criteria of the [employer] [facility] [employer]". [place]. 5 (6): 649-55    LABORATORY DATA:  Lab Results  Component Value Date   WBC 4.4 02/10/2016   HGB 12.0 02/10/2016   HCT 35.0 (L) 02/10/2016   MCV 91.8 02/10/2016   PLT 162.0 02/10/2016   Lab Results  Component Value Date   NA 139 11/17/2016   K 4.3 11/17/2016   CL 102 11/17/2016   CO2 29 11/17/2016   Lab Results  Component Value Date   ALT 11 09/21/2016   AST 13 09/21/2016   ALKPHOS 49 09/21/2016   BILITOT 0.9 09/21/2016      RADIOGRAPHY: Mm Breast Surgical Specimen  Result Date: 11/18/2016 CLINICAL DATA:  Post right breast lumpectomy. EXAM: SPECIMEN RADIOGRAPH OF THE RIGHT BREAST COMPARISON:  Previous exam(s). FINDINGS: Status post excision of the right breast. The radioactive seed and biopsy marker clip are present, completely intact, and were marked for pathology. IMPRESSION: Specimen radiograph of the right breast. Electronically Signed   By: Everlean Alstrom M.D.   On: 11/18/2016 10:32   [place] Rt Radioactive Seed Loc  Result Date: 11/17/2016 CLINICAL DATA:  83 year old patient recently diagnosed with right breast cancer following ultrasound-guided biopsy of a mass in the 5 o'clock position 2 cm from the nipple. She presents today for radioactive seed localization prior to lumpectomy. EXAM: NEEDLE LOCALIZATION OF THE RIGHT BREAST WITH ULTRASOUND GUIDANCE COMPARISON:  Previous exams. FINDINGS: Patient presents for needle localization prior to lumpectomy. I met with the patient and we discussed the procedure of needle localization including benefits and alternatives. We discussed the  high likelihood of a successful procedure. We discussed the risks of the procedure, including infection, bleeding, tissue injury, and further surgery. Informed, written consent was given. The usual time-out protocol was performed immediately prior to the procedure. Using ultrasound guidance, sterile technique, 1% lidocaine and a 7 cm modified Kopans needle, an irregular hypoechoic mass at 5 o'clock position containing biopsy clip artifact was localized using a lateral to medial approach. The images were marked for  Dr. Lucia Gaskins. IMPRESSION: Needle localization right breast. No apparent complications. Electronically Signed   By: Curlene Dolphin M.D.   On: 11/17/2016 13:52   Mm Clip Placement Right  Result Date: 11/17/2016 CLINICAL DATA:  Ultrasound-guided radioactive seed localization was performed today. The patient is scheduled for lumpectomy of a biopsy-proven carcinoma in the inferior right breast, 5 o'clock region. EXAM: DIAGNOSTIC RIGHT MAMMOGRAM POST ULTRASOUND RADIOACTIVE SEED LOCALIZATION COMPARISON:  Previous exam(s). FINDINGS: Mammographic images were obtained following ultrasound guided radioactive seed localization of a biopsy-proven carcinoma in the inferior central right breast. The radioactive seed is immediately adjacent to the ribbon shaped biopsy clip, in satisfactory position. IMPRESSION: Satisfactory position of radioactive seed, immediately adjacent to the ribbon shaped biopsy clip at the site of the biopsy-proven carcinoma. Final Assessment: Post Procedure Mammograms for Marker Placement Electronically Signed   By: Curlene Dolphin M.D.   On: 11/17/2016 13:55       IMPRESSION/PLAN: 1. Stage IA, pT1bcN0cM0 grade 2, ER/PR positive invasive lobular carcinoma with LCIS.Dr. Lisbeth Renshaw discusses the pathology findings and reviews the nature of right breast disease. The patient completed surgical resection, and is ready to move forward with adjuvant therapy. We discussed the risks, benefits, short, and  long term effects of radiotherapy, and the patient is interested in proceeding. Dr. Lisbeth Renshaw discusses the delivery and logistics of radiotherapy and recommends a 4 week treatment. Written consent is obtained and placed in the chart, a copy was provided to the patient. She will simulate in the next week. She will also follow-up with Dr. Jana Hakim in October to discuss antiestrogen therapy. 2. History of bilateral breast cancers. The patient did end up meeting with the genetic counseling team but is not pursuing testing at this time.    In a visit lasting 25 minutes, greater than 50% of the time was spent face to face discussing the role of radiotherapy followed by endocrine therapy, and coordinating the patient's care.  The above documentation reflects my direct findings during this shared patient visit. Please see the separate note by Dr. Lisbeth Renshaw on this date for the remainder of the patient's plan of care.    Carola Rhine, PAC

## 2016-12-02 NOTE — Addendum Note (Signed)
Encounter addended by: Sherrlyn Hock, LPN on: 11/05/2334  1:22 PM<BR>    Actions taken: Order Reconciliation Section accessed, Home Medications modified

## 2016-12-04 DIAGNOSIS — Z923 Personal history of irradiation: Secondary | ICD-10-CM

## 2016-12-04 HISTORY — DX: Personal history of irradiation: Z92.3

## 2016-12-14 ENCOUNTER — Ambulatory Visit
Admission: RE | Admit: 2016-12-14 | Discharge: 2016-12-14 | Disposition: A | Discharge status: Home / Self Care | Payer: Medicare Other | Source: Ambulatory Visit | Attending: Radiation Oncology | Admitting: Radiation Oncology

## 2016-12-14 DIAGNOSIS — Z853 Personal history of malignant neoplasm of breast: Secondary | ICD-10-CM | POA: Diagnosis not present

## 2016-12-14 DIAGNOSIS — C50311 Malignant neoplasm of lower-inner quadrant of right female breast: Secondary | ICD-10-CM

## 2016-12-14 DIAGNOSIS — Z170 Estrogen receptor positive status [ER+]: Principal | ICD-10-CM

## 2016-12-14 DIAGNOSIS — Z510 Encounter for antineoplastic radiation therapy: Secondary | ICD-10-CM | POA: Diagnosis not present

## 2016-12-14 DIAGNOSIS — Z86000 Personal history of in-situ neoplasm of breast: Secondary | ICD-10-CM | POA: Diagnosis not present

## 2016-12-14 DIAGNOSIS — E119 Type 2 diabetes mellitus without complications: Secondary | ICD-10-CM | POA: Diagnosis not present

## 2016-12-15 NOTE — Progress Notes (Signed)
  Radiation Oncology         [phone] ________________________________  Name: Tami Jacobson MRN: [id number]  Date: 12/14/2016  DOB: 1928-02-22  Optical Surface Tracking Plan:  Since intensity modulated radiotherapy (IMRT) and 3D conformal radiation treatment methods are predicated on accurate and precise positioning for treatment, intrafraction motion monitoring is medically necessary to ensure accurate and safe treatment delivery.  The ability to quantify intrafraction motion without excessive ionizing radiation dose can only be performed with optical surface tracking. Accordingly, surface imaging offers the opportunity to obtain 3D measurements of patient position throughout IMRT and 3D treatments without excessive radiation exposure.  I am ordering optical surface tracking for this patient's upcoming course of radiotherapy. ________________________________  Kyung Rudd, MD 12/15/2016 2:25 PM    Reference:   Ursula Alert, J, et al. Surface imaging-based analysis of intrafraction motion for breast radiotherapy patients.Journal of [place], n. 6, nov. 2014. ISSN [id number].   Available at: <http://www.jacmp.org/index.php/jacmp/article/view/4957>.

## 2016-12-15 NOTE — Progress Notes (Signed)
  Radiation Oncology         [phone] ________________________________  Name: Tami Jacobson MRN: [id number]  Date: 12/14/2016  DOB: Feb 18, 1928   DIAGNOSIS:     ICD-10-CM   1. Malignant neoplasm of lower-inner quadrant of right breast of female, estrogen receptor positive ([employer]) C50.311    Z17.0     SIMULATION AND TREATMENT PLANNING NOTE  The patient presented for simulation prior to beginning her course of radiation treatment for her diagnosis of right-sided breast cancer. The patient was placed in a supine position on a breast board. A customized vac-lock bag was constructed and this complex treatment device will be used on a daily basis during her treatment. In this fashion, a CT scan was obtained through the chest area and an isocenter was placed near the chest wall within the breast.  The patient will be planned to receive a course of radiation initially to a dose of 42.5 Gy. This will consist of a whole breast radiotherapy technique. To accomplish this, 2 customized blocks have been designed which will correspond to medial and lateral whole breast tangent fields. This treatment will be accomplished at 2.5 Gy per fraction. A forward planning technique will also be evaluated to determine if this approach improves the plan. It is anticipated that the patient will then receive a 7.5 Gy boost to the seroma cavity which has been contoured. This will be accomplished at 2.5 Gy per fraction.   This initial treatment will consist of a 3-D conformal technique. The seroma has been contoured as the primary target structure. Additionally, dose volume histograms of both this target as well as the lungs and heart will also be evaluated. Such an approach is necessary to ensure that the target area is adequately covered while the nearby critical  normal structures are adequately spared.  Plan:  The final anticipated total dose therefore will correspond to 50  Gy.    _______________________________   Jodelle Gross, MD, PhD

## 2016-12-17 DIAGNOSIS — C50311 Malignant neoplasm of lower-inner quadrant of right female breast: Secondary | ICD-10-CM | POA: Diagnosis not present

## 2016-12-17 DIAGNOSIS — Z170 Estrogen receptor positive status [ER+]: Secondary | ICD-10-CM | POA: Diagnosis not present

## 2016-12-17 DIAGNOSIS — E119 Type 2 diabetes mellitus without complications: Secondary | ICD-10-CM | POA: Diagnosis not present

## 2016-12-17 DIAGNOSIS — Z853 Personal history of malignant neoplasm of breast: Secondary | ICD-10-CM | POA: Diagnosis not present

## 2016-12-17 DIAGNOSIS — Z510 Encounter for antineoplastic radiation therapy: Secondary | ICD-10-CM | POA: Diagnosis not present

## 2016-12-17 DIAGNOSIS — Z86000 Personal history of in-situ neoplasm of breast: Secondary | ICD-10-CM | POA: Diagnosis not present

## 2016-12-21 ENCOUNTER — Ambulatory Visit
Admission: RE | Admit: 2016-12-21 | Discharge: 2016-12-21 | Disposition: A | Discharge status: Home / Self Care | Payer: Medicare Other | Source: Ambulatory Visit | Attending: Radiation Oncology | Admitting: Radiation Oncology

## 2016-12-21 DIAGNOSIS — Z510 Encounter for antineoplastic radiation therapy: Secondary | ICD-10-CM | POA: Diagnosis not present

## 2016-12-21 DIAGNOSIS — E119 Type 2 diabetes mellitus without complications: Secondary | ICD-10-CM | POA: Diagnosis not present

## 2016-12-21 DIAGNOSIS — Z170 Estrogen receptor positive status [ER+]: Secondary | ICD-10-CM | POA: Diagnosis not present

## 2016-12-21 DIAGNOSIS — Z86000 Personal history of in-situ neoplasm of breast: Secondary | ICD-10-CM | POA: Diagnosis not present

## 2016-12-21 DIAGNOSIS — Z853 Personal history of malignant neoplasm of breast: Secondary | ICD-10-CM | POA: Diagnosis not present

## 2016-12-21 DIAGNOSIS — C50311 Malignant neoplasm of lower-inner quadrant of right female breast: Secondary | ICD-10-CM | POA: Diagnosis not present

## 2016-12-22 ENCOUNTER — Ambulatory Visit
Admission: RE | Admit: 2016-12-22 | Discharge: 2016-12-22 | Disposition: A | Discharge status: Home / Self Care | Payer: Medicare Other | Source: Ambulatory Visit | Attending: Radiation Oncology | Admitting: Radiation Oncology

## 2016-12-22 DIAGNOSIS — C50311 Malignant neoplasm of lower-inner quadrant of right female breast: Secondary | ICD-10-CM | POA: Diagnosis not present

## 2016-12-22 DIAGNOSIS — E119 Type 2 diabetes mellitus without complications: Secondary | ICD-10-CM | POA: Diagnosis not present

## 2016-12-22 DIAGNOSIS — Z510 Encounter for antineoplastic radiation therapy: Secondary | ICD-10-CM | POA: Diagnosis not present

## 2016-12-22 DIAGNOSIS — Z853 Personal history of malignant neoplasm of breast: Secondary | ICD-10-CM | POA: Diagnosis not present

## 2016-12-22 DIAGNOSIS — Z86000 Personal history of in-situ neoplasm of breast: Secondary | ICD-10-CM | POA: Diagnosis not present

## 2016-12-22 DIAGNOSIS — Z170 Estrogen receptor positive status [ER+]: Secondary | ICD-10-CM | POA: Diagnosis not present

## 2016-12-23 ENCOUNTER — Ambulatory Visit: Payer: Medicare Other

## 2016-12-23 ENCOUNTER — Ambulatory Visit
Admission: RE | Admit: 2016-12-23 | Discharge: 2016-12-23 | Disposition: A | Discharge status: Home / Self Care | Payer: Medicare Other | Source: Ambulatory Visit | Attending: Radiation Oncology | Admitting: Radiation Oncology

## 2016-12-23 DIAGNOSIS — Z510 Encounter for antineoplastic radiation therapy: Secondary | ICD-10-CM | POA: Diagnosis not present

## 2016-12-23 DIAGNOSIS — Z853 Personal history of malignant neoplasm of breast: Secondary | ICD-10-CM | POA: Diagnosis not present

## 2016-12-23 DIAGNOSIS — Z170 Estrogen receptor positive status [ER+]: Secondary | ICD-10-CM | POA: Diagnosis not present

## 2016-12-23 DIAGNOSIS — Z86000 Personal history of in-situ neoplasm of breast: Secondary | ICD-10-CM | POA: Diagnosis not present

## 2016-12-23 DIAGNOSIS — C50311 Malignant neoplasm of lower-inner quadrant of right female breast: Secondary | ICD-10-CM | POA: Diagnosis not present

## 2016-12-23 DIAGNOSIS — E119 Type 2 diabetes mellitus without complications: Secondary | ICD-10-CM | POA: Diagnosis not present

## 2016-12-24 ENCOUNTER — Ambulatory Visit (INDEPENDENT_AMBULATORY_CARE_PROVIDER_SITE_OTHER): Payer: Medicare Other

## 2016-12-24 ENCOUNTER — Ambulatory Visit
Admission: RE | Admit: 2016-12-24 | Discharge: 2016-12-24 | Disposition: A | Discharge status: Home / Self Care | Payer: Medicare Other | Source: Ambulatory Visit | Attending: Radiation Oncology | Admitting: Radiation Oncology

## 2016-12-24 DIAGNOSIS — Z853 Personal history of malignant neoplasm of breast: Secondary | ICD-10-CM | POA: Diagnosis not present

## 2016-12-24 DIAGNOSIS — Z510 Encounter for antineoplastic radiation therapy: Secondary | ICD-10-CM | POA: Diagnosis not present

## 2016-12-24 DIAGNOSIS — Z23 Encounter for immunization: Secondary | ICD-10-CM | POA: Diagnosis not present

## 2016-12-24 DIAGNOSIS — E119 Type 2 diabetes mellitus without complications: Secondary | ICD-10-CM | POA: Diagnosis not present

## 2016-12-24 DIAGNOSIS — E538 Deficiency of other specified B group vitamins: Secondary | ICD-10-CM | POA: Diagnosis not present

## 2016-12-24 DIAGNOSIS — C50311 Malignant neoplasm of lower-inner quadrant of right female breast: Secondary | ICD-10-CM | POA: Diagnosis not present

## 2016-12-24 DIAGNOSIS — Z86000 Personal history of in-situ neoplasm of breast: Secondary | ICD-10-CM | POA: Diagnosis not present

## 2016-12-24 DIAGNOSIS — Z170 Estrogen receptor positive status [ER+]: Secondary | ICD-10-CM | POA: Diagnosis not present

## 2016-12-24 MED ORDER — CYANOCOBALAMIN 1000 MCG/ML IJ SOLN
1000.0000 ug | Freq: Once | INTRAMUSCULAR | Status: AC
  Administered 2016-12-24: 1000 ug via INTRAMUSCULAR

## 2016-12-25 NOTE — Progress Notes (Signed)
Injection given.   Leiby Pigeon J Kelcy Laible, MD  

## 2016-12-27 ENCOUNTER — Ambulatory Visit
Admission: RE | Admit: 2016-12-27 | Discharge: 2016-12-27 | Disposition: A | Discharge status: Home / Self Care | Payer: Medicare Other | Source: Ambulatory Visit | Attending: Radiation Oncology | Admitting: Radiation Oncology

## 2016-12-27 DIAGNOSIS — Z170 Estrogen receptor positive status [ER+]: Secondary | ICD-10-CM | POA: Diagnosis not present

## 2016-12-27 DIAGNOSIS — E119 Type 2 diabetes mellitus without complications: Secondary | ICD-10-CM | POA: Diagnosis not present

## 2016-12-27 DIAGNOSIS — Z86000 Personal history of in-situ neoplasm of breast: Secondary | ICD-10-CM | POA: Diagnosis not present

## 2016-12-27 DIAGNOSIS — Z510 Encounter for antineoplastic radiation therapy: Secondary | ICD-10-CM | POA: Diagnosis not present

## 2016-12-27 DIAGNOSIS — C50311 Malignant neoplasm of lower-inner quadrant of right female breast: Secondary | ICD-10-CM | POA: Diagnosis not present

## 2016-12-27 DIAGNOSIS — Z853 Personal history of malignant neoplasm of breast: Secondary | ICD-10-CM | POA: Diagnosis not present

## 2016-12-28 ENCOUNTER — Ambulatory Visit
Admission: RE | Admit: 2016-12-28 | Discharge: 2016-12-28 | Disposition: A | Discharge status: Home / Self Care | Payer: Medicare Other | Source: Ambulatory Visit | Attending: Radiation Oncology | Admitting: Radiation Oncology

## 2016-12-28 DIAGNOSIS — Z853 Personal history of malignant neoplasm of breast: Secondary | ICD-10-CM | POA: Diagnosis not present

## 2016-12-28 DIAGNOSIS — E119 Type 2 diabetes mellitus without complications: Secondary | ICD-10-CM | POA: Diagnosis not present

## 2016-12-28 DIAGNOSIS — C50311 Malignant neoplasm of lower-inner quadrant of right female breast: Secondary | ICD-10-CM | POA: Diagnosis not present

## 2016-12-28 DIAGNOSIS — Z510 Encounter for antineoplastic radiation therapy: Secondary | ICD-10-CM | POA: Diagnosis not present

## 2016-12-28 DIAGNOSIS — Z86000 Personal history of in-situ neoplasm of breast: Secondary | ICD-10-CM | POA: Diagnosis not present

## 2016-12-28 DIAGNOSIS — Z170 Estrogen receptor positive status [ER+]: Secondary | ICD-10-CM | POA: Diagnosis not present

## 2016-12-29 ENCOUNTER — Ambulatory Visit
Admission: RE | Admit: 2016-12-29 | Discharge: 2016-12-29 | Disposition: A | Discharge status: Home / Self Care | Payer: Medicare Other | Source: Ambulatory Visit | Attending: Radiation Oncology | Admitting: Radiation Oncology

## 2016-12-29 DIAGNOSIS — C50311 Malignant neoplasm of lower-inner quadrant of right female breast: Secondary | ICD-10-CM | POA: Diagnosis not present

## 2016-12-29 DIAGNOSIS — Z170 Estrogen receptor positive status [ER+]: Secondary | ICD-10-CM | POA: Diagnosis not present

## 2016-12-29 DIAGNOSIS — Z86000 Personal history of in-situ neoplasm of breast: Secondary | ICD-10-CM | POA: Diagnosis not present

## 2016-12-29 DIAGNOSIS — Z510 Encounter for antineoplastic radiation therapy: Secondary | ICD-10-CM | POA: Diagnosis not present

## 2016-12-29 DIAGNOSIS — Z853 Personal history of malignant neoplasm of breast: Secondary | ICD-10-CM | POA: Diagnosis not present

## 2016-12-29 DIAGNOSIS — E119 Type 2 diabetes mellitus without complications: Secondary | ICD-10-CM | POA: Diagnosis not present

## 2016-12-30 ENCOUNTER — Ambulatory Visit
Admission: RE | Admit: 2016-12-30 | Discharge: 2016-12-30 | Disposition: A | Discharge status: Home / Self Care | Payer: Medicare Other | Source: Ambulatory Visit | Attending: Radiation Oncology | Admitting: Radiation Oncology

## 2016-12-30 DIAGNOSIS — E119 Type 2 diabetes mellitus without complications: Secondary | ICD-10-CM | POA: Diagnosis not present

## 2016-12-30 DIAGNOSIS — C50311 Malignant neoplasm of lower-inner quadrant of right female breast: Secondary | ICD-10-CM | POA: Diagnosis not present

## 2016-12-30 DIAGNOSIS — Z510 Encounter for antineoplastic radiation therapy: Secondary | ICD-10-CM | POA: Diagnosis not present

## 2016-12-30 DIAGNOSIS — Z86000 Personal history of in-situ neoplasm of breast: Secondary | ICD-10-CM | POA: Diagnosis not present

## 2016-12-30 DIAGNOSIS — Z170 Estrogen receptor positive status [ER+]: Secondary | ICD-10-CM | POA: Diagnosis not present

## 2016-12-30 DIAGNOSIS — Z853 Personal history of malignant neoplasm of breast: Secondary | ICD-10-CM | POA: Diagnosis not present

## 2016-12-31 ENCOUNTER — Ambulatory Visit
Admission: RE | Admit: 2016-12-31 | Discharge: 2016-12-31 | Disposition: A | Discharge status: Home / Self Care | Payer: Medicare Other | Source: Ambulatory Visit | Attending: Radiation Oncology | Admitting: Radiation Oncology

## 2016-12-31 DIAGNOSIS — C50311 Malignant neoplasm of lower-inner quadrant of right female breast: Secondary | ICD-10-CM

## 2016-12-31 DIAGNOSIS — E119 Type 2 diabetes mellitus without complications: Secondary | ICD-10-CM | POA: Diagnosis not present

## 2016-12-31 DIAGNOSIS — Z510 Encounter for antineoplastic radiation therapy: Secondary | ICD-10-CM | POA: Diagnosis not present

## 2016-12-31 DIAGNOSIS — Z170 Estrogen receptor positive status [ER+]: Secondary | ICD-10-CM | POA: Diagnosis not present

## 2016-12-31 DIAGNOSIS — Z853 Personal history of malignant neoplasm of breast: Secondary | ICD-10-CM | POA: Diagnosis not present

## 2016-12-31 DIAGNOSIS — Z86000 Personal history of in-situ neoplasm of breast: Secondary | ICD-10-CM | POA: Diagnosis not present

## 2016-12-31 MED ORDER — ALRA NON-METALLIC DEODORANT (RAD-ONC)
1.0000 "application " | Freq: Once | TOPICAL | Status: AC
  Administered 2016-12-31: 1 via TOPICAL

## 2016-12-31 MED ORDER — RADIAPLEXRX EX GEL
Freq: Once | CUTANEOUS | Status: AC
  Administered 2016-12-31: 11:00:00 via TOPICAL

## 2016-12-31 NOTE — Progress Notes (Signed)
Pt education done, radiation therapy and you book, my business card, alra and radiaplex given, skin irritation, fatigue, pain, discussed, increase protein in diet,stay hydrated,. Luke warm shower or bath, no rubbing,scrubbing or scratching breat area that treated with radiation, no electric shave, no under wire bra, use alra deodorant after rad tx and prn, use radiaplex after rad tx and bedtime daily, get some exercise, get plenty rest and sleep, dove soap unscented preferred,, sees MD weekly and prn teach back  11:02 AM

## 2017-01-03 ENCOUNTER — Ambulatory Visit
Admission: RE | Admit: 2017-01-03 | Discharge: 2017-01-03 | Disposition: A | Discharge status: Home / Self Care | Payer: Medicare Other | Source: Ambulatory Visit | Attending: Radiation Oncology | Admitting: Radiation Oncology

## 2017-01-03 DIAGNOSIS — C50311 Malignant neoplasm of lower-inner quadrant of right female breast: Secondary | ICD-10-CM | POA: Diagnosis not present

## 2017-01-03 DIAGNOSIS — Z510 Encounter for antineoplastic radiation therapy: Secondary | ICD-10-CM | POA: Diagnosis not present

## 2017-01-03 DIAGNOSIS — Z170 Estrogen receptor positive status [ER+]: Secondary | ICD-10-CM | POA: Diagnosis not present

## 2017-01-03 DIAGNOSIS — E119 Type 2 diabetes mellitus without complications: Secondary | ICD-10-CM | POA: Diagnosis not present

## 2017-01-03 DIAGNOSIS — Z853 Personal history of malignant neoplasm of breast: Secondary | ICD-10-CM | POA: Diagnosis not present

## 2017-01-03 DIAGNOSIS — Z86000 Personal history of in-situ neoplasm of breast: Secondary | ICD-10-CM | POA: Diagnosis not present

## 2017-01-04 ENCOUNTER — Ambulatory Visit
Admission: RE | Admit: 2017-01-04 | Discharge: 2017-01-04 | Disposition: A | Discharge status: Home / Self Care | Payer: Medicare Other | Source: Ambulatory Visit | Attending: Radiation Oncology | Admitting: Radiation Oncology

## 2017-01-04 DIAGNOSIS — E119 Type 2 diabetes mellitus without complications: Secondary | ICD-10-CM | POA: Diagnosis not present

## 2017-01-04 DIAGNOSIS — Z510 Encounter for antineoplastic radiation therapy: Secondary | ICD-10-CM | POA: Diagnosis not present

## 2017-01-04 DIAGNOSIS — Z86000 Personal history of in-situ neoplasm of breast: Secondary | ICD-10-CM | POA: Diagnosis not present

## 2017-01-04 DIAGNOSIS — C50311 Malignant neoplasm of lower-inner quadrant of right female breast: Secondary | ICD-10-CM | POA: Diagnosis not present

## 2017-01-04 DIAGNOSIS — Z170 Estrogen receptor positive status [ER+]: Secondary | ICD-10-CM | POA: Diagnosis not present

## 2017-01-04 DIAGNOSIS — Z853 Personal history of malignant neoplasm of breast: Secondary | ICD-10-CM | POA: Diagnosis not present

## 2017-01-05 ENCOUNTER — Ambulatory Visit
Admission: RE | Admit: 2017-01-05 | Discharge: 2017-01-05 | Disposition: A | Discharge status: Home / Self Care | Payer: Medicare Other | Source: Ambulatory Visit | Attending: Radiation Oncology | Admitting: Radiation Oncology

## 2017-01-05 DIAGNOSIS — Z170 Estrogen receptor positive status [ER+]: Secondary | ICD-10-CM | POA: Diagnosis not present

## 2017-01-05 DIAGNOSIS — E119 Type 2 diabetes mellitus without complications: Secondary | ICD-10-CM | POA: Diagnosis not present

## 2017-01-05 DIAGNOSIS — Z86000 Personal history of in-situ neoplasm of breast: Secondary | ICD-10-CM | POA: Diagnosis not present

## 2017-01-05 DIAGNOSIS — Z853 Personal history of malignant neoplasm of breast: Secondary | ICD-10-CM | POA: Diagnosis not present

## 2017-01-05 DIAGNOSIS — Z510 Encounter for antineoplastic radiation therapy: Secondary | ICD-10-CM | POA: Diagnosis not present

## 2017-01-05 DIAGNOSIS — C50311 Malignant neoplasm of lower-inner quadrant of right female breast: Secondary | ICD-10-CM | POA: Diagnosis not present

## 2017-01-06 ENCOUNTER — Ambulatory Visit
Admission: RE | Admit: 2017-01-06 | Discharge: 2017-01-06 | Disposition: A | Discharge status: Home / Self Care | Payer: Medicare Other | Source: Ambulatory Visit | Attending: Radiation Oncology | Admitting: Radiation Oncology

## 2017-01-06 DIAGNOSIS — C50311 Malignant neoplasm of lower-inner quadrant of right female breast: Secondary | ICD-10-CM | POA: Diagnosis not present

## 2017-01-06 DIAGNOSIS — Z170 Estrogen receptor positive status [ER+]: Secondary | ICD-10-CM | POA: Diagnosis not present

## 2017-01-06 DIAGNOSIS — E119 Type 2 diabetes mellitus without complications: Secondary | ICD-10-CM | POA: Diagnosis not present

## 2017-01-06 DIAGNOSIS — Z86000 Personal history of in-situ neoplasm of breast: Secondary | ICD-10-CM | POA: Diagnosis not present

## 2017-01-06 DIAGNOSIS — Z853 Personal history of malignant neoplasm of breast: Secondary | ICD-10-CM | POA: Diagnosis not present

## 2017-01-06 DIAGNOSIS — Z510 Encounter for antineoplastic radiation therapy: Secondary | ICD-10-CM | POA: Diagnosis not present

## 2017-01-07 ENCOUNTER — Ambulatory Visit
Admission: RE | Admit: 2017-01-07 | Discharge: 2017-01-07 | Disposition: A | Discharge status: Home / Self Care | Payer: Medicare Other | Source: Ambulatory Visit | Attending: Radiation Oncology | Admitting: Radiation Oncology

## 2017-01-07 DIAGNOSIS — C50311 Malignant neoplasm of lower-inner quadrant of right female breast: Secondary | ICD-10-CM

## 2017-01-07 DIAGNOSIS — Z853 Personal history of malignant neoplasm of breast: Secondary | ICD-10-CM | POA: Diagnosis not present

## 2017-01-07 DIAGNOSIS — Z170 Estrogen receptor positive status [ER+]: Principal | ICD-10-CM

## 2017-01-07 DIAGNOSIS — Z86000 Personal history of in-situ neoplasm of breast: Secondary | ICD-10-CM | POA: Diagnosis not present

## 2017-01-07 DIAGNOSIS — E119 Type 2 diabetes mellitus without complications: Secondary | ICD-10-CM | POA: Diagnosis not present

## 2017-01-07 DIAGNOSIS — Z510 Encounter for antineoplastic radiation therapy: Secondary | ICD-10-CM | POA: Diagnosis not present

## 2017-01-07 MED ORDER — RADIAPLEXRX EX GEL
Freq: Once | CUTANEOUS | Status: AC
  Administered 2017-01-07: 11:00:00 via TOPICAL

## 2017-01-10 ENCOUNTER — Ambulatory Visit
Admission: RE | Admit: 2017-01-10 | Discharge: 2017-01-10 | Disposition: A | Discharge status: Home / Self Care | Payer: Medicare Other | Source: Ambulatory Visit | Attending: Radiation Oncology | Admitting: Radiation Oncology

## 2017-01-10 DIAGNOSIS — C50311 Malignant neoplasm of lower-inner quadrant of right female breast: Secondary | ICD-10-CM | POA: Diagnosis not present

## 2017-01-10 DIAGNOSIS — Z86000 Personal history of in-situ neoplasm of breast: Secondary | ICD-10-CM | POA: Diagnosis not present

## 2017-01-10 DIAGNOSIS — Z170 Estrogen receptor positive status [ER+]: Secondary | ICD-10-CM | POA: Diagnosis not present

## 2017-01-10 DIAGNOSIS — Z853 Personal history of malignant neoplasm of breast: Secondary | ICD-10-CM | POA: Diagnosis not present

## 2017-01-10 DIAGNOSIS — E119 Type 2 diabetes mellitus without complications: Secondary | ICD-10-CM | POA: Diagnosis not present

## 2017-01-10 DIAGNOSIS — Z510 Encounter for antineoplastic radiation therapy: Secondary | ICD-10-CM | POA: Diagnosis not present

## 2017-01-11 ENCOUNTER — Ambulatory Visit
Admission: RE | Admit: 2017-01-11 | Discharge: 2017-01-11 | Disposition: A | Discharge status: Home / Self Care | Payer: Medicare Other | Source: Ambulatory Visit | Attending: Radiation Oncology | Admitting: Radiation Oncology

## 2017-01-11 DIAGNOSIS — E119 Type 2 diabetes mellitus without complications: Secondary | ICD-10-CM | POA: Diagnosis not present

## 2017-01-11 DIAGNOSIS — Z170 Estrogen receptor positive status [ER+]: Secondary | ICD-10-CM | POA: Diagnosis not present

## 2017-01-11 DIAGNOSIS — Z853 Personal history of malignant neoplasm of breast: Secondary | ICD-10-CM | POA: Diagnosis not present

## 2017-01-11 DIAGNOSIS — C50311 Malignant neoplasm of lower-inner quadrant of right female breast: Secondary | ICD-10-CM | POA: Diagnosis not present

## 2017-01-11 DIAGNOSIS — Z510 Encounter for antineoplastic radiation therapy: Secondary | ICD-10-CM | POA: Diagnosis not present

## 2017-01-11 DIAGNOSIS — Z86000 Personal history of in-situ neoplasm of breast: Secondary | ICD-10-CM | POA: Diagnosis not present

## 2017-01-12 ENCOUNTER — Ambulatory Visit
Admission: RE | Admit: 2017-01-12 | Discharge: 2017-01-12 | Disposition: A | Discharge status: Home / Self Care | Payer: Medicare Other | Source: Ambulatory Visit | Attending: Radiation Oncology | Admitting: Radiation Oncology

## 2017-01-12 DIAGNOSIS — C50311 Malignant neoplasm of lower-inner quadrant of right female breast: Secondary | ICD-10-CM | POA: Diagnosis not present

## 2017-01-12 DIAGNOSIS — Z170 Estrogen receptor positive status [ER+]: Secondary | ICD-10-CM | POA: Diagnosis not present

## 2017-01-12 DIAGNOSIS — Z510 Encounter for antineoplastic radiation therapy: Secondary | ICD-10-CM | POA: Diagnosis not present

## 2017-01-12 DIAGNOSIS — Z86000 Personal history of in-situ neoplasm of breast: Secondary | ICD-10-CM | POA: Diagnosis not present

## 2017-01-12 DIAGNOSIS — Z853 Personal history of malignant neoplasm of breast: Secondary | ICD-10-CM | POA: Diagnosis not present

## 2017-01-12 DIAGNOSIS — E119 Type 2 diabetes mellitus without complications: Secondary | ICD-10-CM | POA: Diagnosis not present

## 2017-01-13 ENCOUNTER — Ambulatory Visit
Admission: RE | Admit: 2017-01-13 | Discharge: 2017-01-13 | Disposition: A | Discharge status: Home / Self Care | Payer: Medicare Other | Source: Ambulatory Visit | Attending: Radiation Oncology | Admitting: Radiation Oncology

## 2017-01-13 DIAGNOSIS — Z86000 Personal history of in-situ neoplasm of breast: Secondary | ICD-10-CM | POA: Diagnosis not present

## 2017-01-13 DIAGNOSIS — E119 Type 2 diabetes mellitus without complications: Secondary | ICD-10-CM | POA: Diagnosis not present

## 2017-01-13 DIAGNOSIS — Z510 Encounter for antineoplastic radiation therapy: Secondary | ICD-10-CM | POA: Diagnosis not present

## 2017-01-13 DIAGNOSIS — C50311 Malignant neoplasm of lower-inner quadrant of right female breast: Secondary | ICD-10-CM | POA: Diagnosis not present

## 2017-01-13 DIAGNOSIS — Z853 Personal history of malignant neoplasm of breast: Secondary | ICD-10-CM | POA: Diagnosis not present

## 2017-01-13 DIAGNOSIS — Z170 Estrogen receptor positive status [ER+]: Secondary | ICD-10-CM | POA: Diagnosis not present

## 2017-01-14 ENCOUNTER — Ambulatory Visit
Admission: RE | Admit: 2017-01-14 | Discharge: 2017-01-14 | Disposition: A | Discharge status: Home / Self Care | Payer: Medicare Other | Source: Ambulatory Visit | Attending: Radiation Oncology | Admitting: Radiation Oncology

## 2017-01-14 DIAGNOSIS — Z853 Personal history of malignant neoplasm of breast: Secondary | ICD-10-CM | POA: Diagnosis not present

## 2017-01-14 DIAGNOSIS — Z170 Estrogen receptor positive status [ER+]: Secondary | ICD-10-CM | POA: Diagnosis not present

## 2017-01-14 DIAGNOSIS — E119 Type 2 diabetes mellitus without complications: Secondary | ICD-10-CM | POA: Diagnosis not present

## 2017-01-14 DIAGNOSIS — C50311 Malignant neoplasm of lower-inner quadrant of right female breast: Secondary | ICD-10-CM | POA: Diagnosis not present

## 2017-01-14 DIAGNOSIS — Z510 Encounter for antineoplastic radiation therapy: Secondary | ICD-10-CM | POA: Diagnosis not present

## 2017-01-14 DIAGNOSIS — Z86000 Personal history of in-situ neoplasm of breast: Secondary | ICD-10-CM | POA: Diagnosis not present

## 2017-01-17 ENCOUNTER — Ambulatory Visit: Payer: Medicare Other | Admitting: Oncology

## 2017-01-17 ENCOUNTER — Ambulatory Visit
Admission: RE | Admit: 2017-01-17 | Discharge: 2017-01-17 | Disposition: A | Discharge status: Home / Self Care | Payer: Medicare Other | Source: Ambulatory Visit | Attending: Radiation Oncology | Admitting: Radiation Oncology

## 2017-01-17 ENCOUNTER — Telehealth: Payer: Self-pay

## 2017-01-17 DIAGNOSIS — Z170 Estrogen receptor positive status [ER+]: Secondary | ICD-10-CM | POA: Diagnosis not present

## 2017-01-17 DIAGNOSIS — Z510 Encounter for antineoplastic radiation therapy: Secondary | ICD-10-CM | POA: Diagnosis not present

## 2017-01-17 DIAGNOSIS — Z86000 Personal history of in-situ neoplasm of breast: Secondary | ICD-10-CM | POA: Diagnosis not present

## 2017-01-17 DIAGNOSIS — Z853 Personal history of malignant neoplasm of breast: Secondary | ICD-10-CM | POA: Diagnosis not present

## 2017-01-17 DIAGNOSIS — E119 Type 2 diabetes mellitus without complications: Secondary | ICD-10-CM | POA: Diagnosis not present

## 2017-01-17 DIAGNOSIS — C50311 Malignant neoplasm of lower-inner quadrant of right female breast: Secondary | ICD-10-CM | POA: Diagnosis not present

## 2017-01-17 NOTE — Telephone Encounter (Signed)
Pt missed appt today with [facility] d/t XRT running behind schedule.  msg sent to schedulers to reschedule pt to be seen within the next 2 weeks.

## 2017-01-18 ENCOUNTER — Telehealth: Payer: Self-pay | Admitting: Oncology

## 2017-01-18 NOTE — Telephone Encounter (Signed)
Left voicemail for patient regarding rescheduled appts.

## 2017-01-20 ENCOUNTER — Encounter: Payer: Self-pay | Admitting: Radiation Oncology

## 2017-01-20 DIAGNOSIS — Z85828 Personal history of other malignant neoplasm of skin: Secondary | ICD-10-CM | POA: Diagnosis not present

## 2017-01-20 DIAGNOSIS — D485 Neoplasm of uncertain behavior of skin: Secondary | ICD-10-CM | POA: Diagnosis not present

## 2017-01-20 DIAGNOSIS — L570 Actinic keratosis: Secondary | ICD-10-CM | POA: Diagnosis not present

## 2017-01-20 DIAGNOSIS — L821 Other seborrheic keratosis: Secondary | ICD-10-CM | POA: Diagnosis not present

## 2017-01-20 NOTE — Progress Notes (Signed)
  Radiation Oncology         [phone] ________________________________  Name: Tami Jacobson MRN: [id number]  Date: 01/20/2017  DOB: [birth year]  End of Treatment Note  Diagnosis:   Malignant neoplasm of lower-inner quadrant of right female breast     Indication for treatment:  Curative       Radiation treatment dates:   12/21/2016 - 01/17/2017  Site/dose:   Right breast/ 42.5 Gy in 17 fractions                     Boost: 7.5 Gy in 3 fractions  Beams/energy:  Photon/ 6X   Narrative: The patient tolerated radiation treatment relatively well.    Plan: The patient has completed radiation treatment. The patient will return to radiation oncology clinic for routine followup in one month. I advised them to call or return sooner if they have any questions or concerns related to their recovery or treatment.  ------------------------------------------------  Jodelle Gross, MD, PhD  This document serves as a record of services personally performed by Kyung Rudd, MD. It was created on his behalf by Valeta Harms, a trained medical scribe. The creation of this record is based on the scribe's personal observations and the provider's statements to them. This document has been checked and approved by the attending provider.

## 2017-01-24 ENCOUNTER — Encounter: Payer: Self-pay | Admitting: Podiatry

## 2017-01-24 ENCOUNTER — Ambulatory Visit (INDEPENDENT_AMBULATORY_CARE_PROVIDER_SITE_OTHER): Payer: Medicare Other | Admitting: Podiatry

## 2017-01-24 VITALS — BP 151/69 | HR 68

## 2017-01-24 DIAGNOSIS — M79675 Pain in left toe(s): Secondary | ICD-10-CM

## 2017-01-24 DIAGNOSIS — B351 Tinea unguium: Secondary | ICD-10-CM | POA: Diagnosis not present

## 2017-01-24 DIAGNOSIS — M79674 Pain in right toe(s): Secondary | ICD-10-CM | POA: Diagnosis not present

## 2017-01-24 DIAGNOSIS — E119 Type 2 diabetes mellitus without complications: Secondary | ICD-10-CM

## 2017-01-24 NOTE — Progress Notes (Signed)
   Subjective:    Patient ID: Tami Jacobson, female    DOB: 07/09/1927, 83 y.o.   MRN: [id number]  HPI This patient presents today complaining of painful hallux toenails become gradually more symptomatic over the past several years. Patient has a history of permanent nail surgery 10+ years ago 2 with recurrence of the hallux toenails. Patient was last evaluated treated for this problem in our office on 06/27/2013 with debridement of the nails Patient is diabetic and denies history of foot ulceration, claudication or amputation Patient denies smoking history   Review of Systems  All other systems reviewed and are negative.      Objective:   Physical Exam  Orientated 3  Vascular: No peripheral edema or calf tenderness bilaterally DP 2/4 right and 0/4 left PT pulses 2/4 bilaterally Capillary reflex delay bilaterally  Neurological: Sensation to 10 g monofilament wire intact 8/8 bilaterally Vibratory sensation reactive bilaterally Ankle reflexes reactive bilaterally  Dermatological: No open skin lesions bilaterally Atrophic skin without hair growth bilaterally Partial recurrence of deformed, discolored, hypertrophic hallux toenails bilaterally  Musculoskeletal: HAV bilaterally Manual motor testing dorsi flexion, plantar flexion, inversion, eversion 5/5 bilaterally      Assessment & Plan:   Assessment: Diabetic with satisfactory neurovascular status Partial recurrence of symptomatic mycotic hallux toenails  Plan: Reviewed the results exam patient today. We discussed treatment options, including for nail surgery versus debridement. At this time I'm recommending debridement as patient is seated to previous matricectomy's with recurrence Hallux nails were debrided mechanically and electrically with slight bleeding distal left hallux, treated with topical antibiotic ointment and Band-Aid. Patient instructed removed Band-Aid 1-3 days and continue apply topical antibiotic  ointment and Band-Aid until a scab forms  Reappoint at patient's request or yearly

## 2017-01-24 NOTE — Patient Instructions (Signed)
Removed Band-Aid and 1-3 days and apply topical antibiotic ointment and Band-Aid daily until a scab forms  Diabetes and Foot Care Diabetes may cause you to have problems because of poor blood supply (circulation) to your feet and legs. This may cause the skin on your feet to become thinner, break easier, and heal more slowly. Your skin may become dry, and the skin may peel and crack. You may also have nerve damage in your legs and feet causing decreased feeling in them. You may not notice minor injuries to your feet that could lead to infections or more serious problems. Taking care of your feet is one of the most important things you can do for yourself. Follow these instructions at home:  Wear shoes at all times, even in the house. Do not go barefoot. Bare feet are easily injured.  Check your feet daily for blisters, cuts, and redness. If you cannot see the bottom of your feet, use a mirror or ask someone for help.  Wash your feet with warm water (do not use hot water) and mild soap. Then pat your feet and the areas between your toes until they are completely dry. Do not soak your feet as this can dry your skin.  Apply a moisturizing lotion or petroleum jelly (that does not contain alcohol and is unscented) to the skin on your feet and to dry, brittle toenails. Do not apply lotion between your toes.  Trim your toenails straight across. Do not dig under them or around the cuticle. File the edges of your nails with an emery board or nail file.  Do not cut corns or calluses or try to remove them with medicine.  Wear clean socks or stockings every day. Make sure they are not too tight. Do not wear knee-high stockings since they may decrease blood flow to your legs.  Wear shoes that fit properly and have enough cushioning. To break in new shoes, wear them for just a few hours a day. This prevents you from injuring your feet. Always look in your shoes before you put them on to be sure there are no  objects inside.  Do not cross your legs. This may decrease the blood flow to your feet.  If you find a minor scrape, cut, or break in the skin on your feet, keep it and the skin around it clean and dry. These areas may be cleansed with mild soap and water. Do not cleanse the area with peroxide, alcohol, or iodine.  When you remove an adhesive bandage, be sure not to damage the skin around it.  If you have a wound, look at it several times a day to make sure it is healing.  Do not use heating pads or hot water bottles. They may burn your skin. If you have lost feeling in your feet or legs, you may not know it is happening until it is too late.  Make sure your health care provider performs a complete foot exam at least annually or more often if you have foot problems. Report any cuts, sores, or bruises to your health care provider immediately. Contact a health care provider if:  You have an injury that is not healing.  You have cuts or breaks in the skin.  You have an ingrown nail.  You notice redness on your legs or feet.  You feel burning or tingling in your legs or feet.  You have pain or cramps in your legs and feet.  Your legs or  feet are numb.  Your feet always feel cold. Get help right away if:  There is increasing redness, swelling, or pain in or around a wound.  There is a red line that goes up your leg.  Pus is coming from a wound.  You develop a fever or as directed by your health care provider.  You notice a bad smell coming from an ulcer or wound. This information is not intended to replace advice given to you by your health care provider. Make sure you discuss any questions you have with your health care provider. Document Released: 03/19/2000 Document Revised: 08/28/2015 Document Reviewed: 08/29/2012 Elsevier Interactive Patient Education  2017 [employer].

## 2017-01-25 ENCOUNTER — Ambulatory Visit (INDEPENDENT_AMBULATORY_CARE_PROVIDER_SITE_OTHER): Payer: Medicare Other

## 2017-01-25 ENCOUNTER — Telehealth: Payer: Self-pay | Admitting: Oncology

## 2017-01-25 ENCOUNTER — Ambulatory Visit (HOSPITAL_BASED_OUTPATIENT_CLINIC_OR_DEPARTMENT_OTHER): Payer: Medicare Other | Admitting: Oncology

## 2017-01-25 VITALS — BP 136/63 | HR 66 | Temp 97.3°F | Resp 18 | Ht 63.0 in | Wt 143.2 lb

## 2017-01-25 DIAGNOSIS — Z170 Estrogen receptor positive status [ER+]: Secondary | ICD-10-CM | POA: Diagnosis not present

## 2017-01-25 DIAGNOSIS — E538 Deficiency of other specified B group vitamins: Secondary | ICD-10-CM | POA: Diagnosis not present

## 2017-01-25 DIAGNOSIS — Z853 Personal history of malignant neoplasm of breast: Secondary | ICD-10-CM | POA: Diagnosis not present

## 2017-01-25 DIAGNOSIS — Z86000 Personal history of in-situ neoplasm of breast: Secondary | ICD-10-CM | POA: Diagnosis not present

## 2017-01-25 DIAGNOSIS — C50311 Malignant neoplasm of lower-inner quadrant of right female breast: Secondary | ICD-10-CM

## 2017-01-25 MED ORDER — CYANOCOBALAMIN 1000 MCG/ML IJ SOLN
1000.0000 ug | Freq: Once | INTRAMUSCULAR | Status: AC
Start: 2017-01-25 — End: 2017-01-25
  Administered 2017-01-25: 1000 ug via INTRAMUSCULAR

## 2017-01-25 NOTE — Telephone Encounter (Signed)
Gave patient avs report and appointments for November and April

## 2017-01-25 NOTE — Progress Notes (Signed)
[place]  Telephone:([phone]) [phone] [phone]) [phone]     ID: Tami Jacobson DOB: 1927/08/17  MR#: [id number]  Tami Jacobson: Binnie Rail, MD as PCP - General (Internal Medicine) Stanford Breed Denice Bors, MD as Consulting Physician (Cardiology) Khaleel Beckom, Virgie Dad, MD as Consulting Physician (Hematology and Oncology) Newton Pigg, MD as Consulting Physician (Obstetrics and Gynecology) Alphonsa Overall, MD as Consulting Physician (General Surgery) Rozetta Nunnery, MD as Consulting Physician (Otolaryngology) Kyung Rudd, MD as Consulting Physician (Radiation Oncology) Delice Bison, Charlestine Massed, NP as Nurse Practitioner (Hematology and Oncology) OTHER MD:  CHIEF COMPLAINT: Estrogen receptor positive breast cancer  CURRENT TREATMENT: To start tamoxifen   BREAST CANCER HISTORY: From the recent update summary:  The patient had bilateral screening mammography with tomography at the [facility] 06/28/2016 showing only post surgical changes in both breasts. The patient had a history of right lateral breast cancer diagnosed in 2010, T1 cN0, status post lumpectomy March of that year, and she also has a history of T1 CN 0 left breast cancer status post lumpectomy in 2008. She had excisional biopsy on the left of what proved to be atypical lobular hyperplasia March 2013.  On 09/23/2016 the patient was evaluated for pain in the inferior right breast and a feeling of lumpiness. Right [facility] found the breast density to be category C. In the right breast there was skin thickening around the areola. This appeared unchanged. On tomography however there was a suggestion of a small spiculated mass in the inferior central right breast measuring 0.5 cm. This was palpable at the 5:00 position 2 cm from the nipple. Targeted ultrasonography confirmed an irregular mass in this position measuring 0.8 cm. Ultrasound of  the right axilla was negative.  Biopsy of this right breast lower inner quadrant mass 09/27/2016 showed ([id number]) invasive lobular carcinoma, E-cadherin negative, grade not stated, estrogen receptor 80% positive, progesterone receptor 5% positive, both with strong staining intensity, with an MIB-1 of 5%, and no HER-2 amplification, the signals ratio being 1.21 and the number per cell 2.30.  The patient's subsequent history is as detailed below.  INTERVAL HISTORY: Tami Jacobson returns today for follow-up and treatment of her estrogen receptor positive breast cancer. Since her last visit here she had her definitive surgery, namely a right lumpectomy on 11/18/2016. The final pathology ([id number]) confirmed an invasive lobular carcinoma, grade 2, measuring 0.9 cm. Margins were clear. Pt tolerated surgery well. She endorses fatigue from radiation and reports mild erythema to her skin. Her last radiation treatment was on 01/17/2017.  REVIEW OF SYSTEMS: Tami Jacobson reports she is getting ready to move into a townhouse. Her divorce was finalized August 2018. She doesn't go to a gym. She denies unusual headaches, visual changes, nausea, vomiting, or dizziness. There has been no unusual cough, phlegm production, or pleurisy. This been no change in bowel or bladder habits. She denies unexplained fatigue or unexplained weight loss, bleeding, rash, or fever. A detailed review of systems was otherwise entirely stable.    PAST MEDICAL HISTORY: Past Medical History:  Diagnosis Date  . Arthritis    left hip  . Breast cancer, right ([place]) 11/2016  . Family history of breast cancer   . Family history of colon cancer   . Family history of pancreatic cancer   . GERD (gastroesophageal reflux disease)   . Non-insulin dependent type 2 diabetes mellitus ([place])   . PAC (premature atrial contraction)   . Premature  ventricular contraction   . Seasonal allergies   . Sensitive skin   . Vitamin B12 deficiency     PAST  SURGICAL HISTORY: Past Surgical History:  Procedure Laterality Date  . ABDOMINAL HYSTERECTOMY  1979   partial  . BREAST BIOPSY  07/02/2011   Procedure: BREAST BIOPSY WITH NEEDLE LOCALIZATION;  Surgeon: Shann Medal, MD;  Location: [place];  Service: General;  Laterality: Left;  left breast atypical hyperplasia needle localization biopsy  . BREAST LUMPECTOMY Left 07/04/2006  . BREAST LUMPECTOMY Right 06/26/2008  . BREAST LUMPECTOMY WITH RADIOACTIVE SEED LOCALIZATION Right 11/18/2016   Procedure: RIGHT BREAST LUMPECTOMY WITH RADIOACTIVE SEED LOCALIZATION ERAS PATHWAY;  Surgeon: Alphonsa Overall, MD;  Location: [place];  Service: General;  Laterality: Right;  ERAS PATHWAY  . CATARACT EXTRACTION Bilateral   . CHOLECYSTECTOMY, LAPAROSCOPIC  07/07/2001  . ESOPHAGEAL DILATION  2006  . LAPAROSCOPIC APPENDECTOMY  07/07/2001  . TONSILLECTOMY  1942    FAMILY HISTORY Family History  Problem Relation Age of Onset  . Colon cancer Mother 9  . Heart attack Mother   . Breast cancer Sister 48  . Heart disease Maternal Uncle        CABG  . Breast cancer Sister 69  . Heart disease Brother   . Other Father        suicide  . Breast cancer Sister 48  . Heart disease Sister   . COPD Brother   . Heart disease Brother   . Breast cancer Other 8       niece - brother's daughter  . Pancreatic cancer Other        brother's son, dx in his 34s  The patient's father committed suicide at the age of 21. The patient's mother had a history of colon cancer diagnosed at age 73, she died at the age of 16 from unrelated causes. The patient is 2 sisters. 2 sisters died from breast cancer, 1 diagnosed age 79 diagnosed age 36. A third sister was diagnosed with breast cancer at age 56. The patient has 2 brothers. There is no other history of cancer in the family to the patient's knowledge.  GYNECOLOGIC HISTORY:  GX P1, first live birth age 66. The patient took Premarin alone for nearly 30 years, status  post hysterectomy. No LMP recorded. Patient has had a hysterectomy.   SOCIAL HISTORY: She worked as a Network engineer for [employer]. Her husband of more than 44 year, Tami Jacobson, "just left me 13 months ago and moved to an assisted living facility.". Their divorce was finalized August 2018. The patient enters me that her daughter Tami Jacobson is estranged. The patient lives by herself, with no pets.   ADVANCED DIRECTIVES: Not in place. At the 10/28/2016 visit the patient was given the appropriate documents to complete and notarize at her discretion.   HEALTH MAINTENANCE: Social History  Substance Use Topics  . Smoking status: Never Smoker  . Smokeless tobacco: Never Used  . Alcohol use No     Colonoscopy:  PAP:  Bone density:   Allergies  Allergen Reactions  . Statins Other (See Comments)    GI UPSET  . Cephalexin Other (See Comments)    UNKNOWN  . Codeine Other (See Comments)    UNKNOWN  . Lansoprazole Other (See Comments)    UNKNOWN  . Latex Other (See Comments)    UNKNOWN   . Levofloxacin Other (See Comments)    UNKNOWN  . Nitrofurantoin Other (See Comments)    UNKNOWN  .  Rofecoxib Other (See Comments)    UNKNOWN     Current Outpatient Prescriptions  Medication Sig Dispense Refill  . aspirin EC 81 MG tablet Take 1 tablet (81 mg total) by mouth daily. 90 tablet 3  . blood glucose meter kit and supplies KIT Dispense based on patient and insurance preference. Check sugars once daily and as needed as directed. 1 each 0  . clobetasol cream (TEMOVATE) 7.61 % 1 application as needed. Irritated skin    . cyanocobalamin (,VITAMIN B-12,) 1000 MCG/ML injection Inject 1,000 mcg into the muscle every 30 (thirty) days.     . flecainide (TAMBOCOR) 50 MG tablet Take 1 tablet (50 mg total) by mouth 2 (two) times daily. 180 tablet 3  . hyaluronate sodium (RADIAPLEXRX) GEL Apply 1 application topically 2 (two) times daily.    . metFORMIN (GLUCOPHAGE-XR) 500 MG 24 hr tablet TAKE ONE TABLET  BY MOUTH ONCE DAILY WITH  BREAKFAST 90 tablet 1  . metoprolol succinate (TOPROL-XL) 50 MG 24 hr tablet TAKE ONE TABLET BY MOUTH ONCE DAILY WITH OR IMMEDIATELY FOLLOWING A MEAL 30 tablet 11  . naproxen sodium (ANAPROX) 220 MG tablet Take 220 mg by mouth as needed.    Marland Kitchen omeprazole (PRILOSEC) 20 MG capsule Take 1 capsule (20 mg total) by mouth daily. 90 capsule 3  . predniSONE (STERAPRED UNI-PAK 21 TAB) 10 MG (21) TBPK tablet Take by mouth daily.    . tamoxifen (NOLVADEX) 20 MG tablet Take 20 mg by mouth daily.    . traMADol (ULTRAM) 50 MG tablet Take 1 tablet (50 mg total) by mouth every 6 (six) hours as needed for moderate pain. (Patient not taking: Reported on 12/02/2016) 20 tablet 1   No current facility-administered medications for this visit.     OBJECTIVE:Older white woman who appears stated age  84:   01/25/17 1522  BP: 136/63  Pulse: 66  Resp: 18  Temp: (!) 97.3 F (36.3 C)  SpO2: 97%     Body mass index is 25.37 kg/m.   Wt Readings from Last 3 Encounters:  01/25/17 143 lb 3.2 oz (65 kg)  12/02/16 138 lb 8 oz (62.8 kg)  11/18/16 141 lb (64 kg)      ECOG FS:1 - Symptomatic but completely ambulatory Sclerae unicteric, EOMs intact Oropharynx clear and moist No cervical or supraclavicular adenopathy Lungs no rales or rhonchi Heart regular rate and rhythm Abd soft, nontender, positive bowel sounds MSK no focal spinal tenderness, no upper extremity lymphedema Neuro: nonfocal, well oriented, appropriate affect Breasts: The right breast is status post recent lumpectomy and radiation. There is moderate erythema but no desquamation. There is mild swelling. There are no palpable masses. The cosmetic result is good. The left breast is status post remote lumpectomy and radiation. There is no evidence of local recurrence. Both axillae are benign.  LAB RESULTS:  CMP     Component Value Date/Time   NA 139 11/17/2016 1415   NA 143 07/13/2013 0854   K 4.3 11/17/2016 1415   K  4.9 07/13/2013 0854   CL 102 11/17/2016 1415   CL 106 07/11/2012 0901   CO2 29 11/17/2016 1415   CO2 29 07/13/2013 0854   GLUCOSE 172 (H) 11/17/2016 1415   GLUCOSE 142 (H) 07/13/2013 0854   GLUCOSE 110 (H) 07/11/2012 0901   BUN 12 11/17/2016 1415   BUN 14.2 07/13/2013 0854   CREATININE 1.10 (H) 11/17/2016 1415   CREATININE 0.9 07/13/2013 0854   CALCIUM 9.5 11/17/2016 1415  CALCIUM 9.8 07/13/2013 0854   PROT 7.0 09/21/2016 0946   PROT 6.7 07/13/2013 0854   ALBUMIN 4.3 09/21/2016 0946   ALBUMIN 3.7 07/13/2013 0854   AST 13 09/21/2016 0946   AST 16 07/13/2013 0854   ALT 11 09/21/2016 0946   ALT 11 07/13/2013 0854   ALKPHOS 49 09/21/2016 0946   ALKPHOS 50 07/13/2013 0854   BILITOT 0.9 09/21/2016 0946   BILITOT 0.66 07/13/2013 0854   GFRNONAA 44 (L) 11/17/2016 1415   GFRAA 51 (L) 11/17/2016 1415    No results found for: Ronnald Ramp, A1GS, A2GS, BETS, BETA2SER, GAMS, MSPIKE, SPEI  No results found for: Nils Pyle, [facility]  Lab Results  Component Value Date   WBC 4.4 02/10/2016   NEUTROABS 2.9 02/10/2016   HGB 12.0 02/10/2016   HCT 35.0 (L) 02/10/2016   MCV 91.8 02/10/2016   PLT 162.0 02/10/2016      Chemistry      Component Value Date/Time   NA 139 11/17/2016 1415   NA 143 07/13/2013 0854   K 4.3 11/17/2016 1415   K 4.9 07/13/2013 0854   CL 102 11/17/2016 1415   CL 106 07/11/2012 0901   CO2 29 11/17/2016 1415   CO2 29 07/13/2013 0854   BUN 12 11/17/2016 1415   BUN 14.2 07/13/2013 0854   CREATININE 1.10 (H) 11/17/2016 1415   CREATININE 0.9 07/13/2013 0854      Component Value Date/Time   CALCIUM 9.5 11/17/2016 1415   CALCIUM 9.8 07/13/2013 0854   ALKPHOS 49 09/21/2016 0946   ALKPHOS 50 07/13/2013 0854   AST 13 09/21/2016 0946   AST 16 07/13/2013 0854   ALT 11 09/21/2016 0946   ALT 11 07/13/2013 0854   BILITOT 0.9 09/21/2016 0946   BILITOT 0.66 07/13/2013 0854       Lab Results  Component Value Date   LABCA2 18  10/07/2009    No components found for: [id number]  No results for input(s): INR in the last 168 hours.  Urinalysis No results found for: COLORURINE, APPEARANCEUR, LABSPEC, PHURINE, GLUCOSEU, HGBUR, BILIRUBINUR, KETONESUR, PROTEINUR, UROBILINOGEN, NITRITE, LEUKOCYTESUR   STUDIES: Mammography will be due 2017-07-22  ELIGIBLE FOR AVAILABLE RESEARCH PROTOCOL: no  ASSESSMENT: 83 y.o. [place] woman   (1) status post left lumpectomy March 2008 for a pT1c invasive ductal carcinoma, grade 1, strongly estrogen and progesterone receptor positive, HER-2 not amplified, with a borderline MIB-1  (a) received tamoxifen, but very intermittently  (2) status post right lumpectomy and sentinel lymph node sampling 06/28/2008 for a pT1c pN0, stage IA invasive ductal carcinoma, grade 1, estrogen receptor 96% positive, progesterone receptor negative, HER-2/neu negative, with an MIB-1 of 14%  (a) received anastrozole April 2010 to April 2013  (3) status post excision of an area of lobular carcinoma in situ, left breast, 07/02/2011  (a) on tamoxifen April 2013 to April 2015.  (4) status post biopsy of the right breast lower inner quadrant 09/27/2016 for a clinical T1a N0 invasive lobular carcinoma, E-cadherin negative, estrogen and progesterone receptor positive, with no HER-2 amplification and an MIB-1 of 5%.  (5) right lumpectomy without sentinel lymph node sampling 11/18/2016 found a pT1b cN0, stage IA invasive lobular breast cancer, grade 2, with negative margins  (6) adjuvant radiation 12/21/2016 - 01/17/2017 Site/dose:   Right breast/ 42.5 Gy in 17 fractions                    Boost: 7.5 Gy in 3 fractions  (7) tamoxifen  for 5 years   (8) offered genetics testing at meeting with genetics counselor 11/15/2016, but declined  PLAN: Tyshika has completed local therapy for her breast cancer, namely the surgery and radiation. She is now ready to start anti-estrogens.  We had previously read  discussed the differences between anastrozole and tamoxifen. She is very clear she would like to go back on tamoxifen and I think that is entirely reasonable.  Since she is still recovering from the results of the radiation and suggested the first day for her to take her tamoxifen the 03/05/2017.  We also discussed her genetics testing options. She was somewhat overwhelmed when she met with genetics previously but she is now agreeable to being tested. She is already scheduled to return here November 26 to see radiation oncology and I will we'll set her up for lab draw that same day. I have sent an email to our genetics counselor to make sure the lab has the appropriate candidate to draw on that date  Otherwise Zanayah will have her next mammogram in March 2019 and she will see me next April. She knows to call for any problems that may develop before that visit.  Megan Presti, Virgie Dad, MD  01/25/17 3:36 PM Medical Oncology and Hematology [facility] [address] [place], [place] [address] Tel. [phone]    Fax. [phone]  This document serves as a record of services personally performed by Chauncey Cruel, MD. It was created on her behalf by Margit Banda, a trained medical scribe. The creation of this record is based on the scribe's personal observations and the provider's statements to them. This document has been checked and approved by the attending provider.

## 2017-01-26 NOTE — Progress Notes (Signed)
b12 Injection given.   Seon Gaertner J Woods Gangemi, MD  

## 2017-02-01 ENCOUNTER — Other Ambulatory Visit: Payer: Self-pay | Admitting: Orthopedic Surgery

## 2017-02-01 DIAGNOSIS — M25552 Pain in left hip: Secondary | ICD-10-CM

## 2017-02-15 ENCOUNTER — Ambulatory Visit
Admission: RE | Admit: 2017-02-15 | Discharge: 2017-02-15 | Disposition: A | Discharge status: Home / Self Care | Payer: Medicare Other | Source: Ambulatory Visit | Attending: Orthopedic Surgery | Admitting: Orthopedic Surgery

## 2017-02-15 DIAGNOSIS — M25552 Pain in left hip: Secondary | ICD-10-CM | POA: Diagnosis not present

## 2017-02-21 ENCOUNTER — Ambulatory Visit: Payer: Self-pay | Admitting: Radiation Oncology

## 2017-02-22 DIAGNOSIS — M25552 Pain in left hip: Secondary | ICD-10-CM | POA: Diagnosis not present

## 2017-02-22 DIAGNOSIS — M7062 Trochanteric bursitis, left hip: Secondary | ICD-10-CM | POA: Diagnosis not present

## 2017-02-25 ENCOUNTER — Other Ambulatory Visit: Payer: Self-pay | Admitting: *Deleted

## 2017-02-25 DIAGNOSIS — Z853 Personal history of malignant neoplasm of breast: Secondary | ICD-10-CM

## 2017-02-28 ENCOUNTER — Other Ambulatory Visit: Payer: Self-pay

## 2017-02-28 ENCOUNTER — Other Ambulatory Visit (HOSPITAL_BASED_OUTPATIENT_CLINIC_OR_DEPARTMENT_OTHER): Payer: Medicare Other

## 2017-02-28 ENCOUNTER — Ambulatory Visit
Admission: RE | Admit: 2017-02-28 | Discharge: 2017-02-28 | Disposition: A | Discharge status: Home / Self Care | Payer: Medicare Other | Source: Ambulatory Visit | Attending: Radiation Oncology | Admitting: Radiation Oncology

## 2017-02-28 ENCOUNTER — Encounter: Payer: Self-pay | Admitting: Radiation Oncology

## 2017-02-28 VITALS — BP 117/63 | HR 73 | Temp 98.2°F | Resp 18 | Ht 63.0 in | Wt 136.2 lb

## 2017-02-28 DIAGNOSIS — M47896 Other spondylosis, lumbar region: Secondary | ICD-10-CM | POA: Diagnosis not present

## 2017-02-28 DIAGNOSIS — M7062 Trochanteric bursitis, left hip: Secondary | ICD-10-CM | POA: Insufficient documentation

## 2017-02-28 DIAGNOSIS — Z885 Allergy status to narcotic agent status: Secondary | ICD-10-CM | POA: Diagnosis not present

## 2017-02-28 DIAGNOSIS — C50311 Malignant neoplasm of lower-inner quadrant of right female breast: Secondary | ICD-10-CM | POA: Diagnosis present

## 2017-02-28 DIAGNOSIS — Z79899 Other long term (current) drug therapy: Secondary | ICD-10-CM | POA: Insufficient documentation

## 2017-02-28 DIAGNOSIS — Z91040 Latex allergy status: Secondary | ICD-10-CM | POA: Insufficient documentation

## 2017-02-28 DIAGNOSIS — Z79891 Long term (current) use of opiate analgesic: Secondary | ICD-10-CM | POA: Diagnosis not present

## 2017-02-28 DIAGNOSIS — Z170 Estrogen receptor positive status [ER+]: Secondary | ICD-10-CM | POA: Insufficient documentation

## 2017-02-28 DIAGNOSIS — Z923 Personal history of irradiation: Secondary | ICD-10-CM | POA: Insufficient documentation

## 2017-02-28 DIAGNOSIS — K5730 Diverticulosis of large intestine without perforation or abscess without bleeding: Secondary | ICD-10-CM | POA: Diagnosis not present

## 2017-02-28 DIAGNOSIS — Z853 Personal history of malignant neoplasm of breast: Secondary | ICD-10-CM

## 2017-02-28 DIAGNOSIS — Z881 Allergy status to other antibiotic agents status: Secondary | ICD-10-CM | POA: Insufficient documentation

## 2017-02-28 DIAGNOSIS — Z888 Allergy status to other drugs, medicaments and biological substances status: Secondary | ICD-10-CM | POA: Insufficient documentation

## 2017-02-28 DIAGNOSIS — Z7984 Long term (current) use of oral hypoglycemic drugs: Secondary | ICD-10-CM | POA: Diagnosis not present

## 2017-02-28 DIAGNOSIS — F4321 Adjustment disorder with depressed mood: Secondary | ICD-10-CM | POA: Insufficient documentation

## 2017-02-28 DIAGNOSIS — C50911 Malignant neoplasm of unspecified site of right female breast: Secondary | ICD-10-CM | POA: Diagnosis not present

## 2017-02-28 DIAGNOSIS — Z7982 Long term (current) use of aspirin: Secondary | ICD-10-CM | POA: Insufficient documentation

## 2017-02-28 LAB — CBC WITH DIFFERENTIAL/PLATELET
BASO%: 0.3 % (ref 0.0–2.0)
Basophils Absolute: 0 10*3/uL (ref 0.0–0.1)
EOS%: 2.3 % (ref 0.0–7.0)
Eosinophils Absolute: 0.1 10*3/uL (ref 0.0–0.5)
HCT: 38.5 % (ref 34.8–46.6)
HGB: 12.6 g/dL (ref 11.6–15.9)
LYMPH%: 12.2 % — AB (ref 14.0–49.7)
MCH: 30.8 pg (ref 25.1–34.0)
MCHC: 32.7 g/dL (ref 31.5–36.0)
MCV: 94.1 fL (ref 79.5–101.0)
MONO#: 0.8 10*3/uL (ref 0.1–0.9)
MONO%: 13.2 % (ref 0.0–14.0)
NEUT#: 4.5 10*3/uL (ref 1.5–6.5)
NEUT%: 72 % (ref 38.4–76.8)
PLATELETS: 188 10*3/uL (ref 145–400)
RBC: 4.09 10*6/uL (ref 3.70–5.45)
RDW: 14.3 % (ref 11.2–14.5)
WBC: 6.2 10*3/uL (ref 3.9–10.3)
lymph#: 0.8 10*3/uL — ABNORMAL LOW (ref 0.9–3.3)

## 2017-02-28 LAB — COMPREHENSIVE METABOLIC PANEL
ALBUMIN: 3.9 g/dL (ref 3.5–5.0)
ALK PHOS: 84 U/L (ref 40–150)
ALT: 10 U/L (ref 0–55)
AST: 12 U/L (ref 5–34)
Anion Gap: 9 mEq/L (ref 3–11)
BUN: 18.2 mg/dL (ref 7.0–26.0)
CHLORIDE: 101 meq/L (ref 98–109)
CO2: 26 mEq/L (ref 22–29)
Calcium: 10.1 mg/dL (ref 8.4–10.4)
Creatinine: 1.1 mg/dL (ref 0.6–1.1)
EGFR: 45 mL/min/{1.73_m2} — ABNORMAL LOW (ref >60)
GLUCOSE: 179 mg/dL — AB (ref 70–140)
POTASSIUM: 5.1 meq/L (ref 3.5–5.1)
Sodium: 136 mEq/L (ref 136–145)
Total Bilirubin: 0.7 mg/dL (ref 0.20–1.20)
Total Protein: 7.6 g/dL (ref 6.4–8.3)

## 2017-02-28 NOTE — Progress Notes (Signed)
Radiation Oncology         [phone] ________________________________  Name: Tami Jacobson MRN: [id number]  Date of Service: 02/28/2017  DOB: 1927-06-15  Post Treatment Note  CC: Binnie Rail, MD  Magrinat, Virgie Dad, MD  Diagnosis: Stage IA, pT1cN0Mo, grade 1, ER positive invasive ductal carcinoma of the right breast.   Interval Since Last Radiation:  6 weeks   12/21/2016 - 01/17/2017: Right breast/ 42.5 Gy in 17 fractions         Boost: 7.5 Gy in 3 fractions   Narrative:  The patient returns today for routine follow-up. During treatment she did very well with radiotherapy and did not have significant desquamation. I didn't learn until today that Ms. Vallee's long time boyfriend whom she intended to marry passed away suddenly prior to the completion of her radiation.                             On review of systems, the patient states she is trying to cope through her grief since the loss of her partner. She reports she enjoyed Thanksgiving with his family, and has the support of her family, his family, and other friends. She reports medically she believes she's healing well since completion of treatment. She intends to start her Tamoxifen back next week. No other complaints are noted.   ALLERGIES:  is allergic to statins; cephalexin; codeine; lansoprazole; latex; levofloxacin; nitrofurantoin; and rofecoxib.  Meds: Current Outpatient Medications  Medication Sig Dispense Refill  . aspirin EC 81 MG tablet Take 1 tablet (81 mg total) by mouth daily. 90 tablet 3  . blood glucose meter kit and supplies KIT Dispense based on patient and insurance preference. Check sugars once daily and as needed as directed. 1 each 0  . clobetasol cream (TEMOVATE) 1.69 % 1 application as needed. Irritated skin    . cyanocobalamin (,VITAMIN B-12,) 1000 MCG/ML injection Inject 1,000 mcg into the muscle every 30 (thirty) days.     . flecainide (TAMBOCOR) 50 MG tablet Take 1 tablet (50 mg total) by  mouth 2 (two) times daily. 180 tablet 3  . metFORMIN (GLUCOPHAGE-XR) 500 MG 24 hr tablet TAKE ONE TABLET BY MOUTH ONCE DAILY WITH  BREAKFAST 90 tablet 1  . metoprolol succinate (TOPROL-XL) 50 MG 24 hr tablet TAKE ONE TABLET BY MOUTH ONCE DAILY WITH OR IMMEDIATELY FOLLOWING A MEAL 30 tablet 11  . naproxen sodium (ANAPROX) 220 MG tablet Take 220 mg by mouth as needed.    Marland Kitchen omeprazole (PRILOSEC) 20 MG capsule Take 1 capsule (20 mg total) by mouth daily. 90 capsule 3  . tamoxifen (NOLVADEX) 20 MG tablet Take 20 mg by mouth daily.    . traMADol (ULTRAM) 50 MG tablet Take 1 tablet (50 mg total) by mouth every 6 (six) hours as needed for moderate pain. (Patient not taking: Reported on 02/28/2017) 20 tablet 1   No current facility-administered medications for this encounter.     Physical Findings:  height is _0  (1.6 m) and weight is 136 lb 3.2 oz (61.8 kg). Her oral temperature is 98.2 F (36.8 C). Her blood pressure is 117/63 and her pulse is 73. Her respiration is 18 and oxygen saturation is 98%.  Pain Assessment Pain Score: 3  Pain Loc: Hip/10 In general this is a well appearing caucasian female in no acute distress. She's alert and oriented x4 and appropriate throughout the examination. Cardiopulmonary assessment is negative for  acute distress and she exhibits normal effort. The right breast was examined and reveals mild hyperpigmentation along the inframammary fold and areola. No desquamation was noted.    Lab Findings: Lab Results  Component Value Date   WBC 6.2 02/28/2017   HGB 12.6 02/28/2017   HCT 38.5 02/28/2017   MCV 94.1 02/28/2017   PLT 188 02/28/2017     Radiographic Findings: Mr Hip Left Wo Contrast  Result Date: 02/15/2017 CLINICAL DATA:  Medial left hip pain radiating down the thigh over the last 2 years. EXAM: MR OF THE LEFT HIP WITHOUT CONTRAST TECHNIQUE: Multiplanar, multisequence MR imaging was performed. No intravenous contrast was administered. COMPARISON:  CT  pelvis 11/16/2012 FINDINGS: Bones: Lower lumbar spondylosis. No significant regional marrow edema. Articular cartilage and labrum Articular cartilage: Moderate to prominent degenerative chondral thinning bilaterally. Labrum:  Grossly unremarkable Joint or bursal effusion Joint effusion:  Upper normal amount of fluid in the left hip joint. Bursae:  Mild trochanteric bursitis. Muscles and tendons Muscles and tendons: Distal gluteus minimus tendinopathy and mild peritendinitis. Other findings Miscellaneous:  Sigmoid diverticulosis.  Uterus absent. IMPRESSION: 1. Mild left trochanteric bursitis along with tendinopathy is distal gluteus minimus tendon. 2. Upper normal amount of fluid in the left hip joint without overt effusion. 3. Moderate to prominent degenerative chondral thinning in both hips. 4. Lower lumbar spondylosis. 5. Sigmoid diverticulosis. Electronically Signed   By: Van Clines M.D.   On: 02/15/2017 19:38    Impression/Plan: 1. Stage IA, pT1cN0Mo, grade 1, ER positive invasive ductal carcinoma of the right breast. The patient has been doing well since completion of radiotherapy. We discussed that we would be happy to continue to follow her as needed, but she will also continue to follow up with Dr. Jana Hakim in medical oncology and will continue antiestrogen therapy. She was counseled on skin care as well as measures to avoid sun exposure to this area.  2. Grief Counseling. The patient is offered referral for grief counseling through hospice. She will let us know if she would like the contact information but currently feels she has the support she needs.      Carola Rhine, PAC

## 2017-03-01 ENCOUNTER — Ambulatory Visit (INDEPENDENT_AMBULATORY_CARE_PROVIDER_SITE_OTHER): Payer: Medicare Other

## 2017-03-01 DIAGNOSIS — E538 Deficiency of other specified B group vitamins: Secondary | ICD-10-CM

## 2017-03-01 MED ORDER — CYANOCOBALAMIN 1000 MCG/ML IJ SOLN
1000.0000 ug | Freq: Once | INTRAMUSCULAR | Status: AC
  Administered 2017-03-01: 1000 ug via INTRAMUSCULAR

## 2017-03-01 NOTE — Progress Notes (Signed)
b12 Injection given.   Tami Metzgar [name], MD  

## 2017-03-16 ENCOUNTER — Telehealth: Payer: Self-pay | Admitting: Radiation Oncology

## 2017-03-16 ENCOUNTER — Encounter: Payer: Self-pay | Admitting: Radiation Oncology

## 2017-03-16 NOTE — Telephone Encounter (Signed)
Conversation with the patient today following her last visit, unfortunately she had quite a bit of loss in her life in the recent past, her divorce was finalized following that, she was diagnosed with breast cancer, following the surgery and in the midst of radiation, she lost her boyfriend unexpectedly, and has had loss as well within the family regarding an IUFD with her nephews child.  She is losing weight regularly and has lost 6 more pounds since she last saw me about a month ago, is unsure as to whether or not this test with her grief or other physical concerns.  She denies any chest pain, shortness of breath, fevers or chills, she is having hot flashes from tamoxifen, she is also having increased urinary frequency at nighttime and is unsure if this is related to her blood sugar.  He states that she is in the process of moving into a townhouse, and is upset by the fact that she was hoping to move into this townhouse with her boyfriend, she has been struggling with living in a new place, and reports that she eats twice a day.  We revisited the options for discussing grief meeting with a counselor through the hospice agency here in [place].  She requests that I call to make this referral, we also shared information about grief share through her local church communities, and she would be interested in attending however is unable to due to the timing and having to drive in the evening.  I will reach out to nutrition to see if there is any way that we can increase her nutritional intake with samples or coupons for Glucerna, and we will talk to hospice.  I encouraged her to keep me informed of her progress, and she is in agreement with this plan.

## 2017-03-16 NOTE — Telephone Encounter (Signed)
Pt called and I LM for her to call back when I tried reaching her today.

## 2017-03-20 ENCOUNTER — Other Ambulatory Visit: Payer: Self-pay | Admitting: Internal Medicine

## 2017-03-22 ENCOUNTER — Encounter: Payer: Self-pay | Admitting: Nutrition

## 2017-03-22 NOTE — Progress Notes (Signed)
I have mailed patient Glucerna coupons and offered samples.

## 2017-03-22 NOTE — Progress Notes (Signed)
Subjective:    Patient ID: Tami Jacobson, female    DOB: 1927/09/23, 83 y.o.   MRN: [id number]  HPI The patient is here for follow up.  Hypertension: She is taking her medication daily. She is compliant with a low sodium diet.  She denies chest pain, palpitations, edema, shortness of breath and regular headaches. She has been active, but is not exercising regularly.  She does not monitor her blood pressure at home.    Diabetes: She is taking her medication daily as prescribed. She is compliant with a diabetic diet. She has been active, but is not exercising regularly. She monitors her sugars and they have been running 160's. She checks her feet daily and denies foot lesions. She is up-to-date with an ophthalmology examination.   GERD:  She is taking her medication daily as prescribed.  She denies any GERD symptoms and feels her GERD is well controlled.   B12 def:  She gets B12 injections monthly.    Breast cancer:  She had right lateral breast cancer in 2010.  She had left sided breast cancer in 2008.  She had atyical lobular hyperplasia in 2013.  In 09/2016 she had right breast pain and a mammogram /biopsy showed cancer.  She had a lumpectomy then radiation.  She will be on tamoxifen for 5 years.  She has felt weak and tired since having the radiation. She has lost weight due to no appetite.  Her appetite is starting to improve.    Dry mouth:  She is trying to drink a lot of fluids.  She uses the biotene.  She doe snot understand why her mouth is so dry.   Medications and allergies reviewed with patient and updated if appropriate.  Patient Active Problem List   Diagnosis Date Noted  . Family history of breast cancer   . Family history of colon cancer   . Family history of pancreatic cancer   . Malignant neoplasm of lower-inner quadrant of right breast of female, estrogen receptor positive ([place]) 10/28/2016  . Breast pain, right 09/15/2016  . Abnormal electrocardiogram 05/05/2015  .  BPPV (benign paroxysmal positional vertigo) 10/01/2014  . Murmur 04/18/2014  . Anal fissure 12/16/2011  . Fibrocystic breast changes. Left.  Biopsy 07/02/2011. 07/14/2011  . History of breast cancer, Right, T1c, N0, lumpectomy 06/26/2008.   Left, T1c, N0, lumpectomy 06/24/2006. 05/12/2011  . Diabetes mellitus type 2, controlled ([place]) 06/11/2009  . PREMATURE VENTRICULAR CONTRACTIONS 08/19/2008  . B12 deficiency 08/29/2007  . ESOPHAGEAL STRICTURE 04/27/2007  . GERD (gastroesophageal reflux disease) 04/27/2007  . DIVERTICULOSIS, COLON 04/27/2007  . OTHER ALOPECIA 02/14/2007  . HYPERLIPIDEMIA 01/02/2007  . Essential hypertension 01/02/2007  . Carotid stenosis 08/22/2006    Current Outpatient Medications on File Prior to Visit  Medication Sig Dispense Refill  . aspirin EC 81 MG tablet Take 1 tablet (81 mg total) by mouth daily. 90 tablet 3  . blood glucose meter kit and supplies KIT Dispense based on patient and insurance preference. Check sugars once daily and as needed as directed. 1 each 0  . clobetasol cream (TEMOVATE) 3.97 % 1 application as needed. Irritated skin    . cyanocobalamin (,VITAMIN B-12,) 1000 MCG/ML injection Inject 1,000 mcg into the muscle every 30 (thirty) days.     . flecainide (TAMBOCOR) 50 MG tablet Take 1 tablet (50 mg total) by mouth 2 (two) times daily. 180 tablet 3  . metFORMIN (GLUCOPHAGE-XR) 500 MG 24 hr tablet TAKE 1 TABLET BY MOUTH ONCE  DAILY WITH BREAKFAST 90 tablet 0  . metoprolol succinate (TOPROL-XL) 50 MG 24 hr tablet TAKE ONE TABLET BY MOUTH ONCE DAILY WITH OR IMMEDIATELY FOLLOWING A MEAL 30 tablet 11  . naproxen sodium (ANAPROX) 220 MG tablet Take 220 mg by mouth as needed.    Marland Kitchen omeprazole (PRILOSEC) 20 MG capsule Take 1 capsule (20 mg total) by mouth daily. 90 capsule 3  . tamoxifen (NOLVADEX) 20 MG tablet Take 20 mg by mouth daily.    . traMADol (ULTRAM) 50 MG tablet Take 1 tablet (50 mg total) by mouth every 6 (six) hours as needed for moderate pain. 20  tablet 1   No current facility-administered medications on file prior to visit.     Past Medical History:  Diagnosis Date  . Arthritis    left hip  . Breast cancer, right ([place]) 11/2016  . Family history of breast cancer   . Family history of colon cancer   . Family history of pancreatic cancer   . GERD (gastroesophageal reflux disease)   . Non-insulin dependent type 2 diabetes mellitus ([place])   . PAC (premature atrial contraction)   . Premature ventricular contraction   . Seasonal allergies   . Sensitive skin   . Vitamin B12 deficiency     Past Surgical History:  Procedure Laterality Date  . ABDOMINAL HYSTERECTOMY  1979   partial  . BREAST BIOPSY  07/02/2011   Procedure: BREAST BIOPSY WITH NEEDLE LOCALIZATION;  Surgeon: Shann Medal, MD;  Location: [facility];  Service: General;  Laterality: Left;  left breast atypical hyperplasia needle localization biopsy  . BREAST LUMPECTOMY Left 07/04/2006  . BREAST LUMPECTOMY Right 06/26/2008  . BREAST LUMPECTOMY WITH RADIOACTIVE SEED LOCALIZATION Right 11/18/2016   Procedure: RIGHT BREAST LUMPECTOMY WITH RADIOACTIVE SEED LOCALIZATION ERAS PATHWAY;  Surgeon: Alphonsa Overall, MD;  Location: [facility];  Service: General;  Laterality: Right;  ERAS PATHWAY  . CATARACT EXTRACTION Bilateral   . CHOLECYSTECTOMY, LAPAROSCOPIC  07/07/2001  . ESOPHAGEAL DILATION  2006  . LAPAROSCOPIC APPENDECTOMY  07/07/2001  . TONSILLECTOMY  1942    Social History   Socioeconomic History  . Marital status: Divorced    Spouse name: Marcello Moores  . Number of children: 1  . Years of education: None  . Highest education level: None  Social Needs  . Financial resource strain: None  . Food insecurity - worry: None  . Food insecurity - inability: None  . Transportation needs - medical: None  . Transportation needs - non-medical: None  Occupational History  . None  Tobacco Use  . Smoking status: Never Smoker  . Smokeless tobacco: Never Used  Substance  and Sexual Activity  . Alcohol use: No    Alcohol/week: 0.0 oz  . Drug use: No  . Sexual activity: None  Other Topics Concern  . None  Social History Narrative   Retired Network engineer   Married, lives with spouse    Family History  Problem Relation Age of Onset  . Colon cancer Mother 21  . Heart attack Mother   . Breast cancer Sister 105  . Heart disease Maternal Uncle        CABG  . Breast cancer Sister 57  . Heart disease Brother   . Other Father        suicide  . Breast cancer Sister 44  . Heart disease Sister   . COPD Brother   . Heart disease Brother   . Breast cancer Other 45  niece - brother's daughter  . Pancreatic cancer Other        brother's son, dx in his 76s    Review of Systems  Constitutional: Negative for chills and fever.  Respiratory: Negative for cough, shortness of breath and wheezing.   Cardiovascular: Negative for chest pain, palpitations and leg swelling.  Neurological: Positive for headaches (occ sinus headaches). Negative for light-headedness.       Objective:   Vitals:   03/23/17 0941  BP: (!) 150/78  Pulse: 62  Resp: 16  Temp: 98.1 F (36.7 C)  SpO2: 94%   Wt Readings from Last 3 Encounters:  03/23/17 131 lb (59.4 kg)  02/28/17 136 lb 3.2 oz (61.8 kg)  01/25/17 143 lb 3.2 oz (65 kg)   Body mass index is 23.21 kg/m.   Physical Exam    Constitutional: Appears well-developed and well-nourished. No distress.  HENT:  Head: Normocephalic and atraumatic.  Neck: Neck supple. No tracheal deviation present. No thyromegaly present.  No cervical lymphadenopathy Cardiovascular: Normal rate, regular rhythm and normal heart sounds.   2/6 systolic murmur heard. No carotid bruit .  No edema Pulmonary/Chest: Effort normal and breath sounds normal. No respiratory distress. No has no wheezes. No rales.  Skin: Skin is warm and dry. Not diaphoretic.  Psychiatric: Normal mood and affect. Behavior is normal.      Assessment & Plan:     See Problem List for Assessment and Plan of chronic medical problems.

## 2017-03-23 ENCOUNTER — Encounter: Payer: Self-pay | Admitting: Internal Medicine

## 2017-03-23 ENCOUNTER — Ambulatory Visit (INDEPENDENT_AMBULATORY_CARE_PROVIDER_SITE_OTHER): Payer: Medicare Other | Admitting: Internal Medicine

## 2017-03-23 VITALS — BP 150/78 | HR 62 | Temp 98.1°F | Resp 16 | Wt 131.0 lb

## 2017-03-23 DIAGNOSIS — K219 Gastro-esophageal reflux disease without esophagitis: Secondary | ICD-10-CM | POA: Diagnosis not present

## 2017-03-23 DIAGNOSIS — E538 Deficiency of other specified B group vitamins: Secondary | ICD-10-CM | POA: Diagnosis not present

## 2017-03-23 DIAGNOSIS — I10 Essential (primary) hypertension: Secondary | ICD-10-CM | POA: Diagnosis not present

## 2017-03-23 DIAGNOSIS — E119 Type 2 diabetes mellitus without complications: Secondary | ICD-10-CM | POA: Diagnosis not present

## 2017-03-23 LAB — POCT GLYCOSYLATED HEMOGLOBIN (HGB A1C): HEMOGLOBIN A1C: 6.6

## 2017-03-23 NOTE — Assessment & Plan Note (Addendum)
BP Readings from Last 3 Encounters:  03/23/17 (!) 150/78  02/28/17 117/63  01/25/17 136/63   Blood pressure elevated here today, but has been well controlled previously Continue current medication Reviewed recent blood work

## 2017-03-23 NOTE — Patient Instructions (Addendum)
  Your a1c was checked today.   Start monitoring your blood pressure at home.  It should be less than 140/90.   Medications reviewed and updated.  No changes recommended at this time.    Please followup in 6 months

## 2017-03-23 NOTE — Assessment & Plan Note (Signed)
Continue monthly B12 injections

## 2017-03-23 NOTE — Assessment & Plan Note (Signed)
Will obtain eye report A1c today 6.6% Continue current dose of metformin Encouraged regular exercise

## 2017-03-31 ENCOUNTER — Ambulatory Visit (INDEPENDENT_AMBULATORY_CARE_PROVIDER_SITE_OTHER): Payer: Medicare Other | Admitting: *Deleted

## 2017-03-31 DIAGNOSIS — E538 Deficiency of other specified B group vitamins: Secondary | ICD-10-CM

## 2017-03-31 MED ORDER — CYANOCOBALAMIN 1000 MCG/ML IJ SOLN
1000.0000 ug | Freq: Once | INTRAMUSCULAR | Status: AC
  Administered 2017-03-31: 1000 ug via INTRAMUSCULAR

## 2017-04-11 DIAGNOSIS — D23122 Other benign neoplasm of skin of left lower eyelid, including canthus: Secondary | ICD-10-CM | POA: Diagnosis not present

## 2017-04-12 DIAGNOSIS — D23122 Other benign neoplasm of skin of left lower eyelid, including canthus: Secondary | ICD-10-CM | POA: Diagnosis not present

## 2017-04-29 ENCOUNTER — Telehealth: Payer: Self-pay | Admitting: Genetic Counselor

## 2017-04-29 NOTE — Telephone Encounter (Signed)
Left message on VM to please call back about sample.

## 2017-05-02 ENCOUNTER — Ambulatory Visit: Payer: Medicare Other

## 2017-05-02 NOTE — Telephone Encounter (Signed)
Left another VM asking that she return my call.

## 2017-05-03 NOTE — Telephone Encounter (Signed)
Explained that we needed another blood sample for her genetic testing.  We will wait until April and consolidate the blood draw with her lab for her oncology appointment.

## 2017-05-04 ENCOUNTER — Ambulatory Visit: Payer: Medicare Other

## 2017-05-06 ENCOUNTER — Ambulatory Visit (INDEPENDENT_AMBULATORY_CARE_PROVIDER_SITE_OTHER): Payer: Medicare Other

## 2017-05-06 DIAGNOSIS — E538 Deficiency of other specified B group vitamins: Secondary | ICD-10-CM

## 2017-05-06 MED ORDER — CYANOCOBALAMIN 1000 MCG/ML IJ SOLN
1000.0000 ug | Freq: Once | INTRAMUSCULAR | Status: AC
  Administered 2017-05-06: 1000 ug via INTRAMUSCULAR

## 2017-05-06 NOTE — Progress Notes (Signed)
b12 Injection given.   Sharbel Sahagun J Arantxa Piercey, MD  

## 2017-05-18 DIAGNOSIS — L821 Other seborrheic keratosis: Secondary | ICD-10-CM | POA: Diagnosis not present

## 2017-05-20 ENCOUNTER — Other Ambulatory Visit: Payer: Self-pay | Admitting: Cardiology

## 2017-05-20 NOTE — Telephone Encounter (Signed)
REFILL 

## 2017-06-01 ENCOUNTER — Ambulatory Visit (INDEPENDENT_AMBULATORY_CARE_PROVIDER_SITE_OTHER): Payer: Medicare Other | Admitting: Internal Medicine

## 2017-06-01 ENCOUNTER — Encounter: Payer: Self-pay | Admitting: Internal Medicine

## 2017-06-01 VITALS — BP 134/70 | HR 70 | Temp 98.2°F | Resp 16 | Wt 130.0 lb

## 2017-06-01 DIAGNOSIS — F4323 Adjustment disorder with mixed anxiety and depressed mood: Secondary | ICD-10-CM

## 2017-06-01 DIAGNOSIS — F329 Major depressive disorder, single episode, unspecified: Secondary | ICD-10-CM | POA: Insufficient documentation

## 2017-06-01 DIAGNOSIS — F32A Depression, unspecified: Secondary | ICD-10-CM | POA: Insufficient documentation

## 2017-06-01 MED ORDER — VENLAFAXINE HCL ER 37.5 MG PO CP24: 37.5000 mg | ORAL_CAPSULE | Freq: Every day | ORAL | 5 refills | Status: DC

## 2017-06-01 NOTE — Assessment & Plan Note (Signed)
She has been through a lot in the past 20 months and is feeling both depressed and anxious We both feel she would benefit from medication Start effexor 37.5 mg daily given her other medications Will try to stay active F/u in one month, sooner if needed

## 2017-06-01 NOTE — Progress Notes (Signed)
Subjective:    Patient ID: Tami Jacobson, female    DOB: [birth year], 84 y.o.   MRN: [id number]  HPI The patient is here for an acute visit.  She does not have an appetite and she is not eating much.  She has lost 17 lbs since July.  Before she eats breakfast her stomach feels like her stomach is going to jump out of her throat.  Eating usually helps.  Later in the day she will eat and then will not want anymore.   She is very anxious.  She is not sleeping well.  Some nights she will take an aleve pm and will sleep.  She has palpitation at times.  She denies hcest pain.   She admits she may have some depression in addition to anxiety.    Medications and allergies reviewed with patient and updated if appropriate.  Patient Active Problem List   Diagnosis Date Noted  . Family history of breast cancer   . Family history of colon cancer   . Family history of pancreatic cancer   . Malignant neoplasm of lower-inner quadrant of right breast of female, estrogen receptor positive ([place]) 10/28/2016  . Abnormal electrocardiogram 05/05/2015  . BPPV (benign paroxysmal positional vertigo) 10/01/2014  . Murmur 04/18/2014  . Anal fissure 12/16/2011  . Fibrocystic breast changes. Left.  Biopsy 07/02/2011. 07/14/2011  . History of breast cancer, Right, T1c, N0, lumpectomy 06/26/2008.   Left, T1c, N0, lumpectomy 06/24/2006. 05/12/2011  . Diabetes mellitus type 2, controlled ([place]) 06/11/2009  . PREMATURE VENTRICULAR CONTRACTIONS 08/19/2008  . B12 deficiency 08/29/2007  . ESOPHAGEAL STRICTURE 04/27/2007  . GERD (gastroesophageal reflux disease) 04/27/2007  . DIVERTICULOSIS, COLON 04/27/2007  . OTHER ALOPECIA 02/14/2007  . HYPERLIPIDEMIA 01/02/2007  . Essential hypertension 01/02/2007  . Carotid stenosis 08/22/2006    Current Outpatient Medications on File Prior to Visit  Medication Sig Dispense Refill  . aspirin EC 81 MG tablet Take 1 tablet (81 mg total) by mouth daily. 90 tablet 3  . blood  glucose meter kit and supplies KIT Dispense based on patient and insurance preference. Check sugars once daily and as needed as directed. 1 each 0  . clobetasol cream (TEMOVATE) 6.38 % 1 application as needed. Irritated skin    . cyanocobalamin (,VITAMIN B-12,) 1000 MCG/ML injection Inject 1,000 mcg into the muscle every 30 (thirty) days.     . flecainide (TAMBOCOR) 50 MG tablet Take 1 tablet (50 mg total) by mouth 2 (two) times daily. 180 tablet 3  . metFORMIN (GLUCOPHAGE-XR) 500 MG 24 hr tablet TAKE 1 TABLET BY MOUTH ONCE DAILY WITH BREAKFAST 90 tablet 0  . metoprolol succinate (TOPROL-XL) 50 MG 24 hr tablet Take 1 tablet (50 mg total) by mouth daily. KEEP OV. 90 tablet 0  . naproxen sodium (ANAPROX) 220 MG tablet Take 220 mg by mouth as needed.    Marland Kitchen omeprazole (PRILOSEC) 20 MG capsule Take 1 capsule (20 mg total) by mouth daily. 90 capsule 3  . tamoxifen (NOLVADEX) 20 MG tablet Take 20 mg by mouth daily.    . traMADol (ULTRAM) 50 MG tablet Take 1 tablet (50 mg total) by mouth every 6 (six) hours as needed for moderate pain. 20 tablet 1   No current facility-administered medications on file prior to visit.     Past Medical History:  Diagnosis Date  . Arthritis    left hip  . Breast cancer, right ([place]) 11/2016  . Family history of breast cancer   .  Family history of colon cancer   . Family history of pancreatic cancer   . GERD (gastroesophageal reflux disease)   . Non-insulin dependent type 2 diabetes mellitus ([place])   . PAC (premature atrial contraction)   . Premature ventricular contraction   . Seasonal allergies   . Sensitive skin   . Vitamin B12 deficiency     Past Surgical History:  Procedure Laterality Date  . ABDOMINAL HYSTERECTOMY  1979   partial  . BREAST BIOPSY  07/02/2011   Procedure: BREAST BIOPSY WITH NEEDLE LOCALIZATION;  Surgeon: Shann Medal, MD;  Location: [facility];  Service: General;  Laterality: Left;  left breast atypical hyperplasia needle localization biopsy    . BREAST LUMPECTOMY Left 07/04/2006  . BREAST LUMPECTOMY Right 06/26/2008  . BREAST LUMPECTOMY WITH RADIOACTIVE SEED LOCALIZATION Right 11/18/2016   Procedure: RIGHT BREAST LUMPECTOMY WITH RADIOACTIVE SEED LOCALIZATION ERAS PATHWAY;  Surgeon: Alphonsa Overall, MD;  Location: [facility];  Service: General;  Laterality: Right;  ERAS PATHWAY  . CATARACT EXTRACTION Bilateral   . CHOLECYSTECTOMY, LAPAROSCOPIC  07/07/2001  . ESOPHAGEAL DILATION  2006  . LAPAROSCOPIC APPENDECTOMY  07/07/2001  . TONSILLECTOMY  1942    Social History   Socioeconomic History  . Marital status: Divorced    Spouse name: Marcello Moores  . Number of children: 1  . Years of education: Not on file  . Highest education level: Not on file  Social Needs  . Financial resource strain: Not on file  . Food insecurity - worry: Not on file  . Food insecurity - inability: Not on file  . Transportation needs - medical: Not on file  . Transportation needs - non-medical: Not on file  Occupational History  . Not on file  Tobacco Use  . Smoking status: Never Smoker  . Smokeless tobacco: Never Used  Substance and Sexual Activity  . Alcohol use: No    Alcohol/week: 0.0 oz  . Drug use: No  . Sexual activity: Not on file  Other Topics Concern  . Not on file  Social History Narrative   Retired Network engineer   Married, lives with spouse    Family History  Problem Relation Age of Onset  . Colon cancer Mother 63  . Heart attack Mother   . Breast cancer Sister 36  . Heart disease Maternal Uncle        CABG  . Breast cancer Sister 2  . Heart disease Brother   . Other Father        suicide  . Breast cancer Sister 21  . Heart disease Sister   . COPD Brother   . Heart disease Brother   . Breast cancer Other 37       niece - brother's daughter  . Pancreatic cancer Other        brother's son, dx in his 53s    Review of Systems  Constitutional: Positive for appetite change (decreased) and unexpected weight change.   Respiratory: Negative for shortness of breath.   Cardiovascular: Positive for palpitations. Negative for chest pain.  Gastrointestinal: Positive for nausea. Negative for abdominal pain (stomach feels like it is in knots).       Heartburn  Neurological: Negative for light-headedness and headaches.  Psychiatric/Behavioral: Positive for dysphoric mood and sleep disturbance. The patient is nervous/anxious.        Objective:   Vitals:   06/01/17 1302  BP: 134/70  Pulse: 70  Resp: 16  Temp: 98.2 F (36.8 C)  SpO2: 97%  Wt Readings from Last 3 Encounters:  06/01/17 130 lb (59 kg)  03/23/17 131 lb (59.4 kg)  02/28/17 136 lb 3.2 oz (61.8 kg)   Body mass index is 23.03 kg/m.   Physical Exam    Constitutional: Appears well-developed and well-nourished. No distress.  HENT:  Head: Normocephalic and atraumatic.  Neck: Neck supple. No tracheal deviation present. No thyromegaly present.  No cervical lymphadenopathy Cardiovascular: Normal rate, regular rhythm and normal heart sounds.   2/6 systolc murmur heard. No carotid bruit .  No edema Pulmonary/Chest: Effort normal and breath sounds normal. No respiratory distress. No has no wheezes. No rales.  Skin: Skin is warm and dry. Not diaphoretic.  Psychiatric: anxious mood and affect. Behavior is normal.       Assessment & Plan:    See Problem List for Assessment and Plan of chronic medical problems.

## 2017-06-01 NOTE — Patient Instructions (Addendum)
  Medications reviewed and updated.  Changes include starting effexor 37.5 mg daily  Your prescription(s) have been submitted to your pharmacy. Please take as directed and contact our office if you believe you are having problem(s) with the medication(s).  Please followup in 1 month, sooner if needed

## 2017-06-05 NOTE — Progress Notes (Signed)
HPI: FU palpitations and cerebrovascular disease; history of PACs and PVCs with normal LV function. She had significant palpitations in the past despite beta-blockade. She is therefore on flecainide 50 mg p.o. b.i.d. A previous catheterization in June 2000 showed normal coronary arteries and normal LV function. Echo 1/16 showed normal LV function, mild LAE. Nuclear study February 2017 showed ejection fraction 72% and normal perfusion. Carotid dopplers 2/18 showed 1-39 bilateral stenosis. Now being treated for breast cancer. Since I last saw her,  patient denies dyspnea, chest pain or syncope.  Occasional skip but no sustained palpitations.  Current Outpatient Medications  Medication Sig Dispense Refill  . aspirin EC 81 MG tablet Take 1 tablet (81 mg total) by mouth daily. 90 tablet 3  . blood glucose meter kit and supplies KIT Dispense based on patient and insurance preference. Check sugars once daily and as needed as directed. 1 each 0  . clobetasol cream (TEMOVATE) 2.69 % 1 application as needed. Irritated skin    . cyanocobalamin (,VITAMIN B-12,) 1000 MCG/ML injection Inject 1,000 mcg into the muscle every 30 (thirty) days.     . flecainide (TAMBOCOR) 50 MG tablet Take 1 tablet (50 mg total) by mouth 2 (two) times daily. 180 tablet 3  . metFORMIN (GLUCOPHAGE-XR) 500 MG 24 hr tablet TAKE 1 TABLET BY MOUTH ONCE DAILY WITH BREAKFAST 90 tablet 0  . metoprolol succinate (TOPROL-XL) 50 MG 24 hr tablet Take 1 tablet (50 mg total) by mouth daily. KEEP OV. 90 tablet 0  . naproxen sodium (ANAPROX) 220 MG tablet Take 220 mg by mouth as needed.    Marland Kitchen omeprazole (PRILOSEC) 20 MG capsule Take 1 capsule (20 mg total) by mouth daily. 90 capsule 3  . tamoxifen (NOLVADEX) 20 MG tablet Take 20 mg by mouth daily.    Marland Kitchen venlafaxine XR (EFFEXOR XR) 37.5 MG 24 hr capsule Take 1 capsule (37.5 mg total) by mouth daily with breakfast. 30 capsule 5   No current facility-administered medications for this visit.       Past Medical History:  Diagnosis Date  . Arthritis    left hip  . Breast cancer, right ([place]) 11/2016  . Family history of breast cancer   . Family history of colon cancer   . Family history of pancreatic cancer   . GERD (gastroesophageal reflux disease)   . Non-insulin dependent type 2 diabetes mellitus ([place])   . PAC (premature atrial contraction)   . Premature ventricular contraction   . Seasonal allergies   . Sensitive skin   . Vitamin B12 deficiency     Past Surgical History:  Procedure Laterality Date  . ABDOMINAL HYSTERECTOMY  1979   partial  . BREAST BIOPSY  07/02/2011   Procedure: BREAST BIOPSY WITH NEEDLE LOCALIZATION;  Surgeon: Shann Medal, MD;  Location: [facility];  Service: General;  Laterality: Left;  left breast atypical hyperplasia needle localization biopsy  . BREAST LUMPECTOMY Left 07/04/2006  . BREAST LUMPECTOMY Right 06/26/2008  . BREAST LUMPECTOMY WITH RADIOACTIVE SEED LOCALIZATION Right 11/18/2016   Procedure: RIGHT BREAST LUMPECTOMY WITH RADIOACTIVE SEED LOCALIZATION ERAS PATHWAY;  Surgeon: Alphonsa Overall, MD;  Location: [facility];  Service: General;  Laterality: Right;  ERAS PATHWAY  . CATARACT EXTRACTION Bilateral   . CHOLECYSTECTOMY, LAPAROSCOPIC  07/07/2001  . ESOPHAGEAL DILATION  2006  . LAPAROSCOPIC APPENDECTOMY  07/07/2001  . TONSILLECTOMY  1942    Social History   Socioeconomic History  . Marital status: Divorced  Spouse name: Marcello Moores  . Number of children: 1  . Years of education: Not on file  . Highest education level: Not on file  Social Needs  . Financial resource strain: Not on file  . Food insecurity - worry: Not on file  . Food insecurity - inability: Not on file  . Transportation needs - medical: Not on file  . Transportation needs - non-medical: Not on file  Occupational History  . Not on file  Tobacco Use  . Smoking status: Never Smoker  . Smokeless tobacco: Never Used  Substance and Sexual Activity  .  Alcohol use: No    Alcohol/week: 0.0 oz  . Drug use: No  . Sexual activity: Not on file  Other Topics Concern  . Not on file  Social History Narrative   Retired Network engineer   Married, lives with spouse    Family History  Problem Relation Age of Onset  . Colon cancer Mother 64  . Heart attack Mother   . Breast cancer Sister 71  . Heart disease Maternal Uncle        CABG  . Breast cancer Sister 43  . Heart disease Brother   . Other Father        suicide  . Breast cancer Sister 22  . Heart disease Sister   . COPD Brother   . Heart disease Brother   . Breast cancer Other 41       niece - brother's daughter  . Pancreatic cancer Other        brother's son, dx in his 90s    ROS: Depression and weight loss but no fevers or chills, productive cough, hemoptysis, dysphasia, odynophagia, melena, hematochezia, dysuria, hematuria, rash, seizure activity, orthopnea, PND, pedal edema, claudication. Remaining systems are negative.  Physical Exam: Well-developed well-nourished in no acute distress.  Skin is warm and dry.  HEENT is normal.  Neck is supple.  Chest is clear to auscultation with normal expansion.  Cardiovascular exam is regular rate and rhythm.  Abdominal exam nontender or distended. No masses palpated. Extremities show no edema. neuro grossly intact  ECG-sinus rhythm at a rate of 87.  RV conduction delay.  Left axis deviation.  Personally reviewed  A/P  1 carotid artery disease-mild on most recent carotid Dopplers.  2 hypertension-blood pressure is mildly elevated but typically controllled.  Continue present medications and follow.  3 palpitations-felt secondary to PVCs.  Her symptoms are reasonably well controlled today.  We will continue with beta blockade and flecainide.  Kirk Ruths, MD

## 2017-06-06 ENCOUNTER — Ambulatory Visit: Payer: Self-pay | Admitting: *Deleted

## 2017-06-06 ENCOUNTER — Ambulatory Visit (INDEPENDENT_AMBULATORY_CARE_PROVIDER_SITE_OTHER): Payer: Medicare Other

## 2017-06-06 DIAGNOSIS — E538 Deficiency of other specified B group vitamins: Secondary | ICD-10-CM

## 2017-06-06 MED ORDER — CYANOCOBALAMIN 1000 MCG/ML IJ SOLN
1000.0000 ug | Freq: Once | INTRAMUSCULAR | Status: AC
  Administered 2017-06-06: 1000 ug via INTRAMUSCULAR

## 2017-06-06 NOTE — Progress Notes (Signed)
b12 Injection given.   Tami Jacobson J [name] Well, MD  

## 2017-06-06 NOTE — Telephone Encounter (Signed)
Pt stating she spoke with Lovena Le in the office today and was told that Dr. Quay Burow was going to notified of conversation regarding medication. Pt states that she took Venlafaxine XR (EFFEXOR XR) 37.5 MG 24 hr capsule  on Saturday and Sunday and experienced some nausea with taking the medication.Pt states she does not have any nausea today. Pt states she is not taken a dose of the medication today and is not going to take it until she hears back from Dr. Quay Burow. Pt would like a return call at [phone].

## 2017-06-07 NOTE — Telephone Encounter (Signed)
Spoke with pt, she is going to try medication again and will call back on Friday if she is still experiencing the nausea.

## 2017-06-07 NOTE — Telephone Encounter (Signed)
Some medication can cause nausea initially - usually this goes away after a few days.  If she does not want to take it we can try something different or she can retry it and the nausea may go away after a few days

## 2017-06-08 NOTE — Progress Notes (Signed)
This encounter was created in error - please disregard.

## 2017-06-10 ENCOUNTER — Encounter: Payer: Self-pay | Admitting: Cardiology

## 2017-06-10 ENCOUNTER — Ambulatory Visit: Payer: Medicare Other | Admitting: Cardiology

## 2017-06-10 VITALS — BP 146/58 | HR 67 | Ht 63.0 in | Wt 132.0 lb

## 2017-06-10 DIAGNOSIS — I6529 Occlusion and stenosis of unspecified carotid artery: Secondary | ICD-10-CM

## 2017-06-10 DIAGNOSIS — I10 Essential (primary) hypertension: Secondary | ICD-10-CM

## 2017-06-10 DIAGNOSIS — R002 Palpitations: Secondary | ICD-10-CM | POA: Diagnosis not present

## 2017-06-10 NOTE — Patient Instructions (Signed)
Medication Instructions:  Your physician recommends that you continue on your current medications as directed. Please refer to the Current Medication list given to you today.   Labwork: NONE  Testing/Procedures: NONE  Follow-Up: Your physician wants you to follow-up in: [place] DR. CRENSHAW. You will receive a reminder letter in the mail two months in advance. If you don't receive a letter, please call our office to schedule the follow-up appointment.   Any Other Special Instructions Will Be Listed Below (If Applicable).     If you need a refill on your cardiac medications before your next appointment, please call your pharmacy.

## 2017-06-10 NOTE — Addendum Note (Signed)
Addended by: Zebedee Iba on: 06/10/2017 11:22 AM   Modules accepted: Orders

## 2017-06-13 ENCOUNTER — Other Ambulatory Visit: Payer: Self-pay | Admitting: Emergency Medicine

## 2017-06-13 ENCOUNTER — Other Ambulatory Visit: Payer: Self-pay

## 2017-06-13 ENCOUNTER — Other Ambulatory Visit: Payer: Self-pay | Admitting: *Deleted

## 2017-06-13 MED ORDER — VENLAFAXINE HCL ER 37.5 MG PO CP24: 37.5000 mg | ORAL_CAPSULE | Freq: Every day | ORAL | 0 refills | Status: DC

## 2017-06-13 MED ORDER — FLECAINIDE ACETATE 50 MG PO TABS: 50.0000 mg | ORAL_TABLET | Freq: Two times a day (BID) | ORAL | 3 refills | Status: DC

## 2017-06-13 MED ORDER — OMEPRAZOLE 20 MG PO CPDR: 20.0000 mg | DELAYED_RELEASE_CAPSULE | Freq: Every day | ORAL | 3 refills | Status: DC

## 2017-06-13 MED ORDER — TAMOXIFEN CITRATE 20 MG PO TABS: 20.0000 mg | ORAL_TABLET | Freq: Every day | ORAL | 1 refills | Status: DC

## 2017-06-13 MED ORDER — METFORMIN HCL ER 500 MG PO TB24: ORAL_TABLET | ORAL | 1 refills | Status: DC

## 2017-06-13 MED ORDER — METOPROLOL SUCCINATE ER 50 MG PO TB24: 50.0000 mg | ORAL_TABLET | Freq: Every day | ORAL | 3 refills | Status: DC

## 2017-06-20 ENCOUNTER — Telehealth: Payer: Self-pay | Admitting: Internal Medicine

## 2017-06-20 NOTE — Telephone Encounter (Signed)
Can take every other day for one week then every two days for one week then stop.  We can try an alternative if she wants once she gets off this one.

## 2017-06-20 NOTE — Telephone Encounter (Signed)
Is there an alternative?

## 2017-06-20 NOTE — Telephone Encounter (Signed)
Copied from [place]. Topic: Quick Communication - See Telephone Encounter >> Jun 20, 2017  9:34 AM Bea Graff, NT wrote: CRM for notification. See Telephone encounter for: Pt would like to come off the venlafaxine XR (EFFEXOR XR). She states it is making her shake all over and constipated.  06/20/17.

## 2017-06-20 NOTE — Telephone Encounter (Signed)
Spoke with pt to inform, pt does not want an alternative if possible

## 2017-06-23 ENCOUNTER — Ambulatory Visit: Payer: Medicare Other

## 2017-06-27 NOTE — Progress Notes (Signed)
Subjective:    Patient ID: Tami Jacobson, female    DOB: 1927-08-25, 84 y.o.   MRN: [id number]  HPI The patient is here for follow up of anxiety and depression.  She was having difficulty eating and lost weight.  She felt anxious and was having difficulty sleeping.  She had palpitations.  She felt depressed.  We started her on Effexor recently, but she did not tolerate the effexor because it made her shake all over and made her constipated.  She is tapering off the medication.  She is currently taking it every other day and wanted to know when she can officially stop it.  She is taking a stool softener which is helping with constipation.  She thinks part of the shaking is related to when she does not eat and she is making herself eat, which helps.  Overall she thinks she is feeling better.  She does not want to try another medication.  He would like to see how she does off of this medication and go from there.  The improving weather is helping.  She plans on starting to walk soon.  She also plans on joining or doing something socially, which will also help.    Medications and allergies reviewed with patient and updated if appropriate.  Patient Active Problem List   Diagnosis Date Noted  . Adjustment disorder with mixed anxiety and depressed mood 06/01/2017  . Family history of breast cancer   . Family history of colon cancer   . Family history of pancreatic cancer   . Malignant neoplasm of lower-inner quadrant of right breast of female, estrogen receptor positive ([place]) 10/28/2016  . Abnormal electrocardiogram 05/05/2015  . BPPV (benign paroxysmal positional vertigo) 10/01/2014  . Murmur 04/18/2014  . Anal fissure 12/16/2011  . Fibrocystic breast changes. Left.  Biopsy 07/02/2011. 07/14/2011  . History of breast cancer, Right, T1c, N0, lumpectomy 06/26/2008.   Left, T1c, N0, lumpectomy 06/24/2006. 05/12/2011  . Diabetes mellitus type 2, controlled ([place]) 06/11/2009  . PREMATURE  VENTRICULAR CONTRACTIONS 08/19/2008  . B12 deficiency 08/29/2007  . ESOPHAGEAL STRICTURE 04/27/2007  . GERD (gastroesophageal reflux disease) 04/27/2007  . DIVERTICULOSIS, COLON 04/27/2007  . OTHER ALOPECIA 02/14/2007  . HYPERLIPIDEMIA 01/02/2007  . Essential hypertension 01/02/2007  . Carotid stenosis 08/22/2006    Current Outpatient Medications on File Prior to Visit  Medication Sig Dispense Refill  . aspirin EC 81 MG tablet Take 1 tablet (81 mg total) by mouth daily. 90 tablet 3  . blood glucose meter kit and supplies KIT Dispense based on patient and insurance preference. Check sugars once daily and as needed as directed. 1 each 0  . clobetasol cream (TEMOVATE) 7.09 % 1 application as needed. Irritated skin    . cyanocobalamin (,VITAMIN B-12,) 1000 MCG/ML injection Inject 1,000 mcg into the muscle every 30 (thirty) days.     . flecainide (TAMBOCOR) 50 MG tablet Take 1 tablet (50 mg total) by mouth 2 (two) times daily. 180 tablet 3  . metFORMIN (GLUCOPHAGE-XR) 500 MG 24 hr tablet TAKE 1 TABLET BY MOUTH ONCE DAILY WITH BREAKFAST 90 tablet 1  . metoprolol succinate (TOPROL-XL) 50 MG 24 hr tablet Take 1 tablet (50 mg total) by mouth daily. 90 tablet 3  . naproxen sodium (ANAPROX) 220 MG tablet Take 220 mg by mouth as needed.    Marland Kitchen omeprazole (PRILOSEC) 20 MG capsule Take 1 capsule (20 mg total) by mouth daily. 90 capsule 3  . tamoxifen (NOLVADEX) 20 MG tablet  Take 1 tablet (20 mg total) by mouth daily. 30 tablet 1   No current facility-administered medications on file prior to visit.     Past Medical History:  Diagnosis Date  . Arthritis    left hip  . Breast cancer, right ([place]) 11/2016  . Family history of breast cancer   . Family history of colon cancer   . Family history of pancreatic cancer   . GERD (gastroesophageal reflux disease)   . Non-insulin dependent type 2 diabetes mellitus ([place])   . PAC (premature atrial contraction)   . Premature ventricular contraction   .  Seasonal allergies   . Sensitive skin   . Vitamin B12 deficiency     Past Surgical History:  Procedure Laterality Date  . ABDOMINAL HYSTERECTOMY  1979   partial  . BREAST BIOPSY  07/02/2011   Procedure: BREAST BIOPSY WITH NEEDLE LOCALIZATION;  Surgeon: Shann Medal, MD;  Location: [facility];  Service: General;  Laterality: Left;  left breast atypical hyperplasia needle localization biopsy  . BREAST LUMPECTOMY Left 07/04/2006  . BREAST LUMPECTOMY Right 06/26/2008  . BREAST LUMPECTOMY WITH RADIOACTIVE SEED LOCALIZATION Right 11/18/2016   Procedure: RIGHT BREAST LUMPECTOMY WITH RADIOACTIVE SEED LOCALIZATION ERAS PATHWAY;  Surgeon: Alphonsa Overall, MD;  Location: [facility];  Service: General;  Laterality: Right;  ERAS PATHWAY  . CATARACT EXTRACTION Bilateral   . CHOLECYSTECTOMY, LAPAROSCOPIC  07/07/2001  . ESOPHAGEAL DILATION  2006  . LAPAROSCOPIC APPENDECTOMY  07/07/2001  . TONSILLECTOMY  1942    Social History   Socioeconomic History  . Marital status: Divorced    Spouse name: Marcello Moores  . Number of children: 1  . Years of education: Not on file  . Highest education level: Not on file  Occupational History  . Not on file  Social Needs  . Financial resource strain: Not on file  . Food insecurity:    Worry: Not on file    Inability: Not on file  . Transportation needs:    Medical: Not on file    Non-medical: Not on file  Tobacco Use  . Smoking status: Never Smoker  . Smokeless tobacco: Never Used  Substance and Sexual Activity  . Alcohol use: No    Alcohol/week: 0.0 oz  . Drug use: No  . Sexual activity: Not on file  Lifestyle  . Physical activity:    Days per week: Not on file    Minutes per session: Not on file  . Stress: Not on file  Relationships  . Social connections:    Talks on phone: Not on file    Gets together: Not on file    Attends religious service: Not on file    Active member of club or organization: Not on file    Attends meetings of clubs  or organizations: Not on file    Relationship status: Not on file  Other Topics Concern  . Not on file  Social History Narrative   Retired Network engineer   Married, lives with spouse    Family History  Problem Relation Age of Onset  . Colon cancer Mother 7  . Heart attack Mother   . Breast cancer Sister 90  . Heart disease Maternal Uncle        CABG  . Breast cancer Sister 68  . Heart disease Brother   . Other Father        suicide  . Breast cancer Sister 39  . Heart disease Sister   . COPD Brother   .  Heart disease Brother   . Breast cancer Other 20       niece - brother's daughter  . Pancreatic cancer Other        brother's son, dx in his 37s    Review of Systems  Constitutional: Positive for appetite change (appetite better).  Respiratory: Negative for shortness of breath.   Cardiovascular: Negative for chest pain and palpitations.  Gastrointestinal: Positive for constipation. Negative for abdominal pain.  Neurological: Negative for light-headedness and headaches.  Psychiatric/Behavioral: Positive for dysphoric mood (crying less, improved). The patient is nervous/anxious.        Objective:   Vitals:   06/28/17 1133  BP: (!) 142/70  Pulse: 76  Resp: 16  Temp: 98.1 F (36.7 C)  SpO2: 98%   BP Readings from Last 3 Encounters:  06/28/17 (!) 142/70  06/10/17 (!) 146/58  06/01/17 134/70   Wt Readings from Last 3 Encounters:  06/28/17 132 lb (59.9 kg)  06/10/17 132 lb (59.9 kg)  06/01/17 130 lb (59 kg)   Body mass index is 23.38 kg/m.   Physical Exam  Constitutional: She appears well-developed and well-nourished. No distress.  HENT:  Head: Normocephalic and atraumatic.  Skin: Skin is warm and dry. She is not diaphoretic.  Psychiatric: She has a normal mood and affect. Her behavior is normal. Judgment and thought content normal.      .      Assessment & Plan:    See Problem List for Assessment and Plan of chronic medical problems.

## 2017-06-28 ENCOUNTER — Ambulatory Visit (INDEPENDENT_AMBULATORY_CARE_PROVIDER_SITE_OTHER): Payer: Medicare Other | Admitting: Internal Medicine

## 2017-06-28 ENCOUNTER — Encounter: Payer: Self-pay | Admitting: Internal Medicine

## 2017-06-28 VITALS — BP 142/70 | HR 76 | Temp 98.1°F | Resp 16 | Wt 132.0 lb

## 2017-06-28 DIAGNOSIS — F4323 Adjustment disorder with mixed anxiety and depressed mood: Secondary | ICD-10-CM | POA: Diagnosis not present

## 2017-06-28 MED ORDER — GLUCOSE BLOOD VI STRP: ORAL_STRIP | 3 refills | Status: DC

## 2017-06-28 NOTE — Patient Instructions (Signed)
Continue the effexor every other day for the rest of the week, then stop it.    If you have any symptoms after stopping it they should resolve quickly.   Call with any questions.    Let me know if you want to try a different medication for depression/anxiety.

## 2017-06-28 NOTE — Assessment & Plan Note (Signed)
She did not tolerate Effexor and she is tapering off of it-currently taking it every other day and will stop it at the end of the week He does feel little bit better, but this may be from the Effexor.  She does not want to try a new medication and she will see how she does without any medication Encouraged her to start regular walking/exercise She will try to join a social club She will monitor her appetite, weight and sleep along with depression and anxiety and let me know if she feels she needs to try different medication

## 2017-06-29 DIAGNOSIS — C50911 Malignant neoplasm of unspecified site of right female breast: Secondary | ICD-10-CM | POA: Diagnosis not present

## 2017-06-29 DIAGNOSIS — Z170 Estrogen receptor positive status [ER+]: Secondary | ICD-10-CM | POA: Diagnosis not present

## 2017-07-04 ENCOUNTER — Other Ambulatory Visit: Payer: Self-pay

## 2017-07-04 DIAGNOSIS — Z01419 Encounter for gynecological examination (general) (routine) without abnormal findings: Secondary | ICD-10-CM | POA: Diagnosis not present

## 2017-07-04 MED ORDER — GLUCOSE BLOOD VI STRP: ORAL_STRIP | 12 refills | Status: DC

## 2017-07-07 ENCOUNTER — Ambulatory Visit (INDEPENDENT_AMBULATORY_CARE_PROVIDER_SITE_OTHER): Payer: Medicare Other

## 2017-07-07 ENCOUNTER — Encounter: Payer: Self-pay | Admitting: Internal Medicine

## 2017-07-07 DIAGNOSIS — E538 Deficiency of other specified B group vitamins: Secondary | ICD-10-CM | POA: Diagnosis not present

## 2017-07-07 MED ORDER — CYANOCOBALAMIN 1000 MCG/ML IJ SOLN
1000.0000 ug | Freq: Once | INTRAMUSCULAR | Status: AC
  Administered 2017-07-07: 1000 ug via INTRAMUSCULAR

## 2017-07-07 NOTE — Progress Notes (Signed)
b12 Injection given.   Hitoshi Werts J Kaylinn Dedic, MD  

## 2017-07-11 ENCOUNTER — Telehealth: Payer: Self-pay | Admitting: Internal Medicine

## 2017-07-11 NOTE — Telephone Encounter (Signed)
Copied from [place]. Topic: Quick Communication - See Telephone Encounter >> Jul 11, 2017 10:30 AM Cleaster Corin, NT wrote: CRM for notification. See Telephone encounter for: 07/11/17. Jonni Sanger calling from [employer] to see if fax was received for pre authorization for  Acuu check guide meter (fax was sent 3/4 and 3/28)  Jonni Sanger can reached at [phone]

## 2017-07-12 NOTE — Telephone Encounter (Signed)
Spoke with Rep from [facility], states there is not an existing prior auth for meter.

## 2017-07-20 NOTE — Progress Notes (Signed)
[facility]  Telephone:([phone]) [phone] Fax:([phone]) [phone]     ID: Tami Jacobson DOB: 10-Jun-1927  MR#: [id number]  BWL#:[id number]  Patient Care Team: Binnie Rail, MD as PCP - General (Internal Medicine) Stanford Breed Denice Bors, MD as Consulting Physician (Cardiology) Magrinat, Virgie Dad, MD as Consulting Physician (Hematology and Oncology) Newton Pigg, MD as Consulting Physician (Obstetrics and Gynecology) Alphonsa Overall, MD as Consulting Physician (General Surgery) Rozetta Nunnery, MD as Consulting Physician (Otolaryngology) Kyung Rudd, MD as Consulting Physician (Radiation Oncology) Delice Bison, Charlestine Massed, NP as Nurse Practitioner (Hematology and Oncology) OTHER MD:  CHIEF COMPLAINT: Estrogen receptor positive breast cancer  CURRENT TREATMENT:  tamoxifen   BREAST CANCER HISTORY: From the recent update summary:  The patient had bilateral screening mammography with tomography at the [facility] 06/28/2016 showing only post surgical changes in both breasts. The patient had a history of right lateral breast cancer diagnosed in 2010, T1 cN0, status post lumpectomy March of that year, and she also has a history of T1 CN 0 left breast cancer status post lumpectomy in 2008. She had excisional biopsy on the left of what proved to be atypical lobular hyperplasia March 2013.  On 09/23/2016 the patient was evaluated for pain in the inferior right breast and a feeling of lumpiness. [facility] found the breast density to be category C. In the right breast there was skin thickening around the areola. This appeared unchanged. On tomography however there was a suggestion of a small spiculated mass in the inferior central right breast measuring 0.5 cm. This was palpable at the 5:00 position 2 cm from the nipple. Targeted ultrasonography confirmed an irregular mass in this position measuring 0.8 cm. Ultrasound of the right  axilla was negative.  Biopsy of this right breast lower inner quadrant mass 09/27/2016 showed ([id number]) invasive lobular carcinoma, E-cadherin negative, grade not stated, estrogen receptor 80% positive, progesterone receptor 5% positive, both with strong staining intensity, with an MIB-1 of 5%, and no HER-2 amplification, the signals ratio being 1.21 and the number per cell 2.30.  The patient's subsequent history is as detailed below.  INTERVAL HISTORY: Tami Jacobson returns today for follow-up and treatment of her estrogen receptor positive breast cancer. She continues on tamoxifen, with good tolerance. She denies issues with hot flashes. She has occasional increased vaginal discharge, but this is not a major issue for her.   REVIEW OF SYSTEMS: Tami Jacobson reports that she is doing better than she used to. She has been trying to sell her house. She moved to a townhouse within a retirement community in [place] in December. For exercise, she walks to her mailbox. She plans to start walking in her neighborhood as the weather warms up. She denies unusual headaches, visual changes, nausea, vomiting, or dizziness. There has been no unusual cough, phlegm production, or pleurisy. This been no change in bowel or bladder habits. She denies unexplained fatigue or unexplained weight loss, bleeding, rash, or fever. A detailed review of systems was otherwise stable.     PAST MEDICAL HISTORY: Past Medical History:  Diagnosis Date  . Arthritis    left hip  . Breast cancer, right ([place]) 11/2016  . Family history of breast cancer   . Family history of colon cancer   . Family history of pancreatic cancer   . GERD (gastroesophageal reflux disease)   . Non-insulin dependent type 2 diabetes mellitus ([place])   . PAC (premature atrial contraction)   .  Premature ventricular contraction   . Seasonal allergies   . Sensitive skin   . Vitamin B12 deficiency     PAST SURGICAL HISTORY: Past Surgical History:  Procedure  Laterality Date  . ABDOMINAL HYSTERECTOMY  1979   partial  . BREAST BIOPSY  07/02/2011   Procedure: BREAST BIOPSY WITH NEEDLE LOCALIZATION;  Surgeon: Shann Medal, MD;  Location: [facility];  Service: General;  Laterality: Left;  left breast atypical hyperplasia needle localization biopsy  . BREAST LUMPECTOMY Left 07/04/2006  . BREAST LUMPECTOMY Right 06/26/2008  . BREAST LUMPECTOMY WITH RADIOACTIVE SEED LOCALIZATION Right 11/18/2016   Procedure: RIGHT BREAST LUMPECTOMY WITH RADIOACTIVE SEED LOCALIZATION ERAS PATHWAY;  Surgeon: Alphonsa Overall, MD;  Location: [facility];  Service: General;  Laterality: Right;  ERAS PATHWAY  . CATARACT EXTRACTION Bilateral   . CHOLECYSTECTOMY, LAPAROSCOPIC  07/07/2001  . ESOPHAGEAL DILATION  2006  . LAPAROSCOPIC APPENDECTOMY  07/07/2001  . TONSILLECTOMY  1942    FAMILY HISTORY Family History  Problem Relation Age of Onset  . Colon cancer Mother 22  . Heart attack Mother   . Breast cancer Sister 19  . Heart disease Maternal Uncle        CABG  . Breast cancer Sister 35  . Heart disease Brother   . Other Father        suicide  . Breast cancer Sister 30  . Heart disease Sister   . COPD Brother   . Heart disease Brother   . Breast cancer Other 30       niece - brother's daughter  . Pancreatic cancer Other        brother's son, dx in his 62s  The patient's father committed suicide at the age of 61. The patient's mother had a history of colon cancer diagnosed at age 43, she died at the age of 42 from unrelated causes. The patient is 23 sisters. 2 sisters died from breast cancer, 1 diagnosed age 65 diagnosed age 61. A third sister was diagnosed with breast cancer at age 58. The patient has 2 brothers. There is no other history of cancer in the family to the patient's knowledge.  GYNECOLOGIC HISTORY:  GX P1, first live birth age 46. The patient took Premarin alone for nearly 30 years, status post hysterectomy. No LMP recorded. Patient has had a  hysterectomy.   SOCIAL HISTORY: She worked as a Network engineer for [employer]. Her husband of more than 21 year, Marcello Moores, "just left me and moved to an assisted living facility.". Their divorce was finalized August 2018. The patient enters me that her daughter Margarita Grizzle is estranged. The patient lives by herself, with no pets.   ADVANCED DIRECTIVES: Not in place. At the 10/28/2016 visit the patient was given the appropriate documents to complete and notarize at her discretion.   HEALTH MAINTENANCE: Social History   Tobacco Use  . Smoking status: Never Smoker  . Smokeless tobacco: Never Used  Substance Use Topics  . Alcohol use: No    Alcohol/week: 0.0 oz  . Drug use: No     Colonoscopy:  PAP:  Bone density:   Allergies  Allergen Reactions  . Statins Other (See Comments)    GI UPSET  . Effexor [Venlafaxine]     Constipation, shaking  . Cephalexin Other (See Comments)    UNKNOWN  . Codeine Other (See Comments)    UNKNOWN  . Lansoprazole Other (See Comments)    UNKNOWN  . Latex Other (See Comments)  UNKNOWN   . Levofloxacin Other (See Comments)    UNKNOWN  . Nitrofurantoin Other (See Comments)    UNKNOWN  . Rofecoxib Other (See Comments)    UNKNOWN     Current Outpatient Medications  Medication Sig Dispense Refill  . aspirin EC 81 MG tablet Take 1 tablet (81 mg total) by mouth daily. 90 tablet 3  . blood glucose meter kit and supplies KIT Dispense based on patient and insurance preference. Check sugars once daily and as needed as directed. 1 each 0  . clobetasol cream (TEMOVATE) 7.82 % 1 application as needed. Irritated skin    . cyanocobalamin (,VITAMIN B-12,) 1000 MCG/ML injection Inject 1,000 mcg into the muscle every 30 (thirty) days.     . flecainide (TAMBOCOR) 50 MG tablet Take 1 tablet (50 mg total) by mouth 2 (two) times daily. 180 tablet 3  . glucose blood test strip accu check test strips DxE11.09--use to check blood sugars once daily 100 each 12  .  metFORMIN (GLUCOPHAGE-XR) 500 MG 24 hr tablet TAKE 1 TABLET BY MOUTH ONCE DAILY WITH BREAKFAST 90 tablet 1  . metoprolol succinate (TOPROL-XL) 50 MG 24 hr tablet Take 1 tablet (50 mg total) by mouth daily. 90 tablet 3  . naproxen sodium (ANAPROX) 220 MG tablet Take 220 mg by mouth as needed.    Marland Kitchen omeprazole (PRILOSEC) 20 MG capsule Take 1 capsule (20 mg total) by mouth daily. 90 capsule 3  . tamoxifen (NOLVADEX) 20 MG tablet Take 1 tablet (20 mg total) by mouth daily. 30 tablet 1   No current facility-administered medications for this visit.     OBJECTIVE:Older white woman in no acute distress  Vitals:   07/25/17 1230  BP: (!) 135/50  Pulse: 75  Resp: 18  Temp: 98.2 F (36.8 C)  SpO2: 100%     Body mass index is 23.26 kg/m.   Wt Readings from Last 3 Encounters:  07/25/17 131 lb 4.8 oz (59.6 kg)  06/28/17 132 lb (59.9 kg)  06/10/17 132 lb (59.9 kg)      ECOG FS:1 - Symptomatic but completely ambulatory  Sclerae unicteric, pupils round and equal Oropharynx clear and moist No cervical or supraclavicular adenopathy Lungs no rales or rhonchi Heart regular rate and rhythm Abd soft, nontender, positive bowel sounds MSK no focal spinal tenderness, no upper extremity lymphedema Neuro: nonfocal, well oriented, appropriate affect Breasts: On the right the breast is status post lumpectomy and radiation.  The cosmetic result is excellent.  There is no evidence of local recurrence.  The left breast is benign.  Both axillae are benign.  LAB RESULTS:  CMP     Component Value Date/Time   NA 136 02/28/2017 1453   K 5.1 02/28/2017 1453   CL 102 11/17/2016 1415   CL 106 07/11/2012 0901   CO2 26 02/28/2017 1453   GLUCOSE 179 (H) 02/28/2017 1453   GLUCOSE 110 (H) 07/11/2012 0901   BUN 18.2 02/28/2017 1453   CREATININE 1.1 02/28/2017 1453   CALCIUM 10.1 02/28/2017 1453   PROT 7.6 02/28/2017 1453   ALBUMIN 3.9 02/28/2017 1453   AST 12 02/28/2017 1453   ALT 10 02/28/2017 1453    ALKPHOS 84 02/28/2017 1453   BILITOT 0.70 02/28/2017 1453   GFRNONAA 44 (L) 11/17/2016 1415   GFRAA 51 (L) 11/17/2016 1415    No results found for: TOTALPROTELP, ALBUMINELP, A1GS, A2GS, BETS, BETA2SER, GAMS, MSPIKE, SPEI  No results found for: KPAFRELGTCHN, LAMBDASER, KAPLAMBRATIO  Lab Results  Component Value Date   WBC 6.2 02/28/2017   NEUTROABS 4.5 02/28/2017   HGB 12.6 02/28/2017   HCT 38.5 02/28/2017   MCV 94.1 02/28/2017   PLT 188 02/28/2017      Chemistry      Component Value Date/Time   NA 136 02/28/2017 1453   K 5.1 02/28/2017 1453   CL 102 11/17/2016 1415   CL 106 07/11/2012 0901   CO2 26 02/28/2017 1453   BUN 18.2 02/28/2017 1453   CREATININE 1.1 02/28/2017 1453      Component Value Date/Time   CALCIUM 10.1 02/28/2017 1453   ALKPHOS 84 02/28/2017 1453   AST 12 02/28/2017 1453   ALT 10 02/28/2017 1453   BILITOT 0.70 02/28/2017 1453       Lab Results  Component Value Date   LABCA2 18 10/07/2009    No components found for: UTMLYY503  No results for input(s): INR in the last 168 hours.  Urinalysis No results found for: COLORURINE, APPEARANCEUR, LABSPEC, PHURINE, GLUCOSEU, HGBUR, BILIRUBINUR, KETONESUR, PROTEINUR, UROBILINOGEN, NITRITE, LEUKOCYTESUR   STUDIES: No results found.   ELIGIBLE FOR AVAILABLE RESEARCH PROTOCOL: no  ASSESSMENT: 84 y.o. [place] woman   (1) status post left lumpectomy March 2008 for a pT1c invasive ductal carcinoma, grade 1, strongly estrogen and progesterone receptor positive, HER-2 not amplified, with a borderline MIB-1  (a) received tamoxifen, but very intermittently  (2) status post right lumpectomy and sentinel lymph node sampling 06/28/2008 for a pT1c pN0, stage IA invasive ductal carcinoma, grade 1, estrogen receptor 96% positive, progesterone receptor negative, HER-2/neu negative, with an MIB-1 of 14%  (a) received anastrozole April 2010 to April 2013  (3) status post excision of an area of lobular carcinoma  in situ, left breast, 07/02/2011  (a) on tamoxifen April 2013 to April 2015.  (4) status post biopsy of the right breast lower inner quadrant 09/27/2016 for a clinical T1a N0 invasive lobular carcinoma, E-cadherin negative, estrogen and progesterone receptor positive, with no HER-2 amplification and an MIB-1 of 5%.  (5) right lumpectomy without sentinel lymph node sampling 11/18/2016 found a pT1b cN0, stage IA invasive lobular breast cancer, grade 2, with negative margins  (6) adjuvant radiation 12/21/2016 - 01/17/2017 Site/dose:   Right breast/ 42.5 Gy in 17 fractions                    Boost: 7.5 Gy in 3 fractions  (7) tamoxifen for 5 years started 03/05/2017  (8) offered genetics testing at meeting with genetics counselor 11/15/2016, but declined  PLAN: Tami Jacobson will soon be a year out from definitive surgery without evidence of disease recurrence.  This is favorable.  She is tolerating tamoxifen well and the plan will be to continue that a minimum of 5 years.  She is due for mammography and I have put that in for her to have it done May 2019.  I have scheduled her to see my 63 assistant in 6 months and then to see me again in a year, after her mammogram 2020  I am delighted to hear that her nephew will be taking her on a trip to [place].  She has been somewhat alienated from family and this is a plus for her.  She knows to call for any issues that may develop before the next visit.  Magrinat, Virgie Dad, MD  07/25/17 1:21 PM Medical Oncology and Hematology [facility] [address] [place], [place] [address] Tel. [phone]    Fax.  [phone]   Magrinat, Virgie Dad, MD  07/25/17 1:21 PM Medical Oncology and Hematology [facility] [address] [place], [place] [address] Tel. [phone]    Fax. [phone]  This document serves as a record of services personally performed by Lurline Del, MD. It was created on his behalf by  Sheron Nightingale, a trained medical scribe. The creation of this record is based on the scribe's personal observations and the provider's statements to them.   I have reviewed the above documentation for accuracy and completeness, and I agree with the above.

## 2017-07-25 ENCOUNTER — Inpatient Hospital Stay: Payer: Medicare Other

## 2017-07-25 ENCOUNTER — Telehealth: Payer: Self-pay | Admitting: Oncology

## 2017-07-25 ENCOUNTER — Inpatient Hospital Stay: Payer: Medicare Other | Attending: Oncology | Admitting: Oncology

## 2017-07-25 VITALS — BP 135/50 | HR 75 | Temp 98.2°F | Resp 18 | Ht 63.0 in | Wt 131.3 lb

## 2017-07-25 DIAGNOSIS — Z170 Estrogen receptor positive status [ER+]: Secondary | ICD-10-CM

## 2017-07-25 DIAGNOSIS — Z79810 Long term (current) use of selective estrogen receptor modulators (SERMs): Secondary | ICD-10-CM | POA: Diagnosis not present

## 2017-07-25 DIAGNOSIS — C50311 Malignant neoplasm of lower-inner quadrant of right female breast: Secondary | ICD-10-CM | POA: Insufficient documentation

## 2017-07-25 DIAGNOSIS — Z803 Family history of malignant neoplasm of breast: Secondary | ICD-10-CM | POA: Diagnosis not present

## 2017-07-25 DIAGNOSIS — Z800 Family history of malignant neoplasm of digestive organs: Secondary | ICD-10-CM | POA: Diagnosis not present

## 2017-07-25 NOTE — Telephone Encounter (Signed)
Gave patient AVs and calendar of upcoming November and may 2020 appointments.

## 2017-08-15 ENCOUNTER — Ambulatory Visit: Payer: Medicare Other

## 2017-08-15 DIAGNOSIS — H524 Presbyopia: Secondary | ICD-10-CM | POA: Diagnosis not present

## 2017-08-15 DIAGNOSIS — Z961 Presence of intraocular lens: Secondary | ICD-10-CM | POA: Diagnosis not present

## 2017-08-15 DIAGNOSIS — H5000 Unspecified esotropia: Secondary | ICD-10-CM | POA: Diagnosis not present

## 2017-08-15 LAB — HM DIABETES EYE EXAM

## 2017-08-16 ENCOUNTER — Ambulatory Visit (INDEPENDENT_AMBULATORY_CARE_PROVIDER_SITE_OTHER): Payer: Medicare Other | Admitting: *Deleted

## 2017-08-16 ENCOUNTER — Other Ambulatory Visit: Payer: Self-pay | Admitting: Cardiology

## 2017-08-16 DIAGNOSIS — E538 Deficiency of other specified B group vitamins: Secondary | ICD-10-CM

## 2017-08-16 MED ORDER — CYANOCOBALAMIN 1000 MCG/ML IJ SOLN
1000.0000 ug | Freq: Once | INTRAMUSCULAR | Status: AC
  Administered 2017-08-16: 1000 ug via INTRAMUSCULAR

## 2017-08-16 NOTE — Telephone Encounter (Signed)
These meds were refilled to [facility] on 06/13/17 LM for patient with this info

## 2017-08-16 NOTE — Telephone Encounter (Signed)
New Message      *STAT* If patient is at the pharmacy, call can be transferred to refill team.   1. Which medications need to be refilled? (please list name of each medication and dose if known) flecainide (TAMBOCOR) 50 MG tablet and metoprolol succinate (TOPROL-XL) 50 MG 24 hr tablet 2. Which pharmacy/location (including street and city if local pharmacy) is medication to be sent to? [facility] 3. Do they need a 30 day or 90 day supply? [place]

## 2017-08-18 ENCOUNTER — Other Ambulatory Visit: Payer: Self-pay | Admitting: *Deleted

## 2017-08-18 DIAGNOSIS — C50311 Malignant neoplasm of lower-inner quadrant of right female breast: Secondary | ICD-10-CM

## 2017-08-18 DIAGNOSIS — Z170 Estrogen receptor positive status [ER+]: Principal | ICD-10-CM

## 2017-08-18 MED ORDER — TAMOXIFEN CITRATE 20 MG PO TABS: 20.0000 mg | ORAL_TABLET | Freq: Every day | ORAL | 2 refills | Status: DC

## 2017-08-19 NOTE — Progress Notes (Signed)
b12 Injection given.   Tami Pio J Siera Beyersdorf, MD  

## 2017-08-26 ENCOUNTER — Ambulatory Visit
Admission: RE | Admit: 2017-08-26 | Discharge: 2017-08-26 | Disposition: A | Discharge status: Home / Self Care | Payer: Medicare Other | Source: Ambulatory Visit | Attending: Oncology | Admitting: Oncology

## 2017-08-26 DIAGNOSIS — Z170 Estrogen receptor positive status [ER+]: Principal | ICD-10-CM

## 2017-08-26 DIAGNOSIS — R922 Inconclusive mammogram: Secondary | ICD-10-CM | POA: Diagnosis not present

## 2017-08-26 DIAGNOSIS — C50311 Malignant neoplasm of lower-inner quadrant of right female breast: Secondary | ICD-10-CM

## 2017-08-26 HISTORY — DX: Personal history of irradiation: Z92.3

## 2017-08-30 ENCOUNTER — Ambulatory Visit: Payer: Self-pay | Admitting: Genetic Counselor

## 2017-08-30 ENCOUNTER — Encounter: Payer: Self-pay | Admitting: Genetic Counselor

## 2017-08-30 ENCOUNTER — Telehealth: Payer: Self-pay | Admitting: Genetic Counselor

## 2017-08-30 DIAGNOSIS — Z800 Family history of malignant neoplasm of digestive organs: Secondary | ICD-10-CM

## 2017-08-30 DIAGNOSIS — Z803 Family history of malignant neoplasm of breast: Secondary | ICD-10-CM

## 2017-08-30 DIAGNOSIS — Z1379 Encounter for other screening for genetic and chromosomal anomalies: Secondary | ICD-10-CM

## 2017-08-30 DIAGNOSIS — C50311 Malignant neoplasm of lower-inner quadrant of right female breast: Secondary | ICD-10-CM

## 2017-08-30 DIAGNOSIS — Z170 Estrogen receptor positive status [ER+]: Secondary | ICD-10-CM

## 2017-08-30 NOTE — Telephone Encounter (Signed)
Revealed negative genetic testing.  Discussed that we do not know why she has breast cancer or why there is cancer in the family. It could be due to a different gene that we are not testing, or maybe our current technology may not be able to pick something up.  It will be important for her to keep in contact with genetics to keep up with whether additional testing may be needed. 

## 2017-08-30 NOTE — Progress Notes (Signed)
HPI:  Ms. Haefele was previously seen in the [place] clinic due to a personal and family history of cancer and concerns regarding a hereditary predisposition to cancer. Please refer to our prior cancer genetics clinic note for more information regarding Ms. Kwasny's medical, social and family histories, and our assessment and recommendations, at the time. Ms. Osier recent genetic test results were disclosed to her, as were recommendations warranted by these results. These results and recommendations are discussed in more detail below.  CANCER HISTORY:    Malignant neoplasm of lower-inner quadrant of right breast of female, estrogen receptor positive ([place])   10/28/2016 Initial Diagnosis    Malignant neoplasm of lower-inner quadrant of right breast of female, estrogen receptor positive ([place])      11/18/2016 Surgery    Right lumpectomy: ILC, grade 2, 0.9cm, margins negative, ER+(80%), PR+(5%), Ki-67 5%, HER-2 negative (ratio 1.21).  No SLN biopsy.      08/06/2017 Genetic Testing    Negative genetic testing on the common hereditary cancer panel.  The Hereditary Gene Panel offered by [facility] includes sequencing and/or deletion duplication testing of the following 47 genes: APC, ATM, AXIN2, BARD1, BMPR1A, BRCA1, BRCA2, BRIP1, CDH1, CDK4, CDKN2A (p14ARF), CDKN2A (p16INK4a), CHEK2, CTNNA1, DICER1, EPCAM (Deletion/duplication testing only), GREM1 (promoter region deletion/duplication testing only), KIT, MEN1, MLH1, MSH2, MSH3, MSH6, MUTYH, NBN, NF1, NHTL1, PALB2, PDGFRA, PMS2, POLD1, POLE, PTEN, RAD50, RAD51C, RAD51D, SDHB, SDHC, SDHD, SMAD4, SMARCA4. STK11, TP53, TSC1, TSC2, and VHL.  The following genes were evaluated for sequence changes only: SDHA and HOXB13 c.251G>A variant only.  The report date is Aug 06, 2017.       FAMILY HISTORY:  We obtained a detailed, 4-generation family history.  Significant diagnoses are listed below: Family History  Problem Relation Age of Onset  .  Colon cancer Mother 71  . Heart attack Mother   . Breast cancer Sister 73  . Heart disease Maternal Uncle        CABG  . Breast cancer Sister 47  . Heart disease Brother   . Other Father        suicide  . Breast cancer Sister 50  . Heart disease Sister   . COPD Brother   . Heart disease Brother   . Breast cancer Other 42       niece - brother's daughter  . Pancreatic cancer Other        brother's son, dx in his 60s    The patient has one daughter and one grandson who are cancer free.  Her daughter reportedly has tested negative for BRCA mutations.  She has four sisters and two brothers.  Three sisters were diagnosed with breast cancer, one sister and one brother died of heart disease and one brother is living.  This brother had a son who died of pancreatic cancer.  The other brother has a daughter who had breast cancer at 83.  The patient's parents are both deceased.  Her mother had colon cancer at 66.  She was one of 10 children.  No other siblings had cancer.  The patient's maternal grandparents are deceased from unknown causes.  The patient's father died from suicide at 1.  He was in the process of being worked up for a GI problem and had an endoscopy scheduled a few days after he shot himself.  He was one of 8 children.  No others had cancer.  His father died of some form of cancer in his 43's.  Patient's  ancestors are of Scot[place]-Irish descent. There is no reported Ashkenazi Jewish ancestry. There is no known consanguinity.  GENETIC TEST RESULTS: Genetic testing reported out on Aug 06, 2017 through the common hereditary cancer panel found no deleterious mutations.  The Hereditary Gene Panel offered by [facility] includes sequencing and/or deletion duplication testing of the following 47 genes: APC, ATM, AXIN2, BARD1, BMPR1A, BRCA1, BRCA2, BRIP1, CDH1, CDK4, CDKN2A (p14ARF), CDKN2A (p16INK4a), CHEK2, CTNNA1, DICER1, EPCAM (Deletion/duplication testing only), GREM1 (promoter region  deletion/duplication testing only), KIT, MEN1, MLH1, MSH2, MSH3, MSH6, MUTYH, NBN, NF1, NHTL1, PALB2, PDGFRA, PMS2, POLD1, POLE, PTEN, RAD50, RAD51C, RAD51D, SDHB, SDHC, SDHD, SMAD4, SMARCA4. STK11, TP53, TSC1, TSC2, and VHL.  The following genes were evaluated for sequence changes only: SDHA and HOXB13 c.251G>A variant only.  The test report has been scanned into EPIC and is located under the Molecular Pathology section of the Results Review tab.    We discussed with Ms. Lennartz that since the current genetic testing is not perfect, it is possible there may be a gene mutation in one of these genes that current testing cannot detect, but that chance is small.  We also discussed, that it is possible that another gene that has not yet been discovered, or that we have not yet tested, is responsible for the cancer diagnoses in the family, and it is, therefore, important to remain in touch with cancer genetics in the future so that we can continue to offer Ms. Mcglown the most up to date genetic testing.   CANCER SCREENING RECOMMENDATIONS:  Given Ms. Kinnett's personal and family histories, we must interpret these negative results with some caution.  Families with features suggestive of hereditary risk for cancer tend to have multiple family members with cancer, diagnoses in multiple generations and diagnoses before the age of 23. Ms. Esselman family exhibits some of these features. Thus this result may simply reflect our current inability to detect all mutations within these genes or there may be a different gene that has not yet been discovered or tested.   An individual's cancer risk and medical management are not determined by genetic test results alone. Overall cancer risk assessment incorporates additional factors, including personal medical history, family history, and any available genetic information that may result in a personalized plan for cancer prevention and surveillance.  RECOMMENDATIONS FOR  FAMILY MEMBERS:  Women in this family might be at some increased risk of developing cancer, over the general population risk, simply due to the family history of cancer.  We recommended women in this family have a yearly mammogram beginning at age 93, or 10 years younger than the earliest onset of cancer, an annual clinical breast exam, and perform monthly breast self-exams. Women in this family should also have a gynecological exam as recommended by their primary provider. All family members should have a colonoscopy by age 21.  FOLLOW-UP: Lastly, we discussed with Ms. Roldan that cancer genetics is a rapidly advancing field and it is possible that new genetic tests will be appropriate for her and/or her family members in the future. We encouraged her to remain in contact with cancer genetics on an annual basis so we can update her personal and family histories and let her know of advances in cancer genetics that may benefit this family.   Our contact number was provided. Ms. Schexnider questions were answered to her satisfaction, and she knows she is welcome to call us at anytime with additional questions or concerns.   Roma Kayser, MS, [place]  Certified [employer] [place] [email or web address]

## 2017-09-02 ENCOUNTER — Telehealth: Payer: Self-pay | Admitting: Internal Medicine

## 2017-09-02 NOTE — Telephone Encounter (Signed)
Copied from [place] [id number]. Topic: Quick Communication - See Telephone Encounter >> Sep 02, 2017  8:34 AM Conception Chancy, NT wrote: CRM for notification. See Telephone encounter for: 09/02/17.  Patient is calling and requesting a refill for metFORMIN (GLUCOPHAGE-XR) 500 MG 24 hr tablet. Please advise.  [place], [place] [facility] [address] [place] [address] Phone: [phone] Fax: [phone]

## 2017-09-02 NOTE — Telephone Encounter (Signed)
Called [facility]  rx   And  Spoke with [facility]  Pharmacist  [phone]  And  Patient has a refill on  her Metformin  The pharmacist states there is a note on her record stating could only be refilled with patients permission    Called and left a message on patients  Answering machine  To call [facility] and  Authorize the refill

## 2017-09-15 ENCOUNTER — Ambulatory Visit (INDEPENDENT_AMBULATORY_CARE_PROVIDER_SITE_OTHER): Payer: Medicare Other

## 2017-09-15 DIAGNOSIS — E538 Deficiency of other specified B group vitamins: Secondary | ICD-10-CM

## 2017-09-15 MED ORDER — CYANOCOBALAMIN 1000 MCG/ML IJ SOLN
1000.0000 ug | Freq: Once | INTRAMUSCULAR | Status: AC
  Administered 2017-09-15: 1000 ug via INTRAMUSCULAR

## 2017-09-15 NOTE — Progress Notes (Signed)
b12 Injection given.   Tami Kniskern J [name], MD  

## 2017-09-15 NOTE — Progress Notes (Signed)
cyah

## 2017-09-22 ENCOUNTER — Ambulatory Visit: Payer: Medicare Other

## 2017-09-22 ENCOUNTER — Ambulatory Visit: Payer: Medicare Other | Admitting: Internal Medicine

## 2017-09-22 ENCOUNTER — Ambulatory Visit (INDEPENDENT_AMBULATORY_CARE_PROVIDER_SITE_OTHER): Payer: Medicare Other | Admitting: *Deleted

## 2017-09-22 VITALS — BP 117/62 | HR 62 | Resp 18 | Ht 63.0 in | Wt 133.0 lb

## 2017-09-22 DIAGNOSIS — Z0000 Encounter for general adult medical examination without abnormal findings: Secondary | ICD-10-CM | POA: Diagnosis not present

## 2017-09-22 NOTE — Patient Instructions (Signed)
Continue doing brain stimulating activities (puzzles, reading, adult coloring books, staying active) to keep memory sharp.   Continue to eat heart healthy diet (full of fruits, vegetables, whole grains, lean protein, water--limit salt, fat, and sugar intake) and increase physical activity as tolerated.   Tami Jacobson , Thank you for taking time to come for your Medicare Wellness Visit. I appreciate your ongoing commitment to your health goals. Please review the following plan we discussed and let me know if I can assist you in the future.   These are the goals we discussed: Goals    . Patient Stated     I want to continue to do the crossword puzzle and read as much as possible. I will stay socially active by doing things with my friend, starting to volunteer, and go to church.       This is a list of the screening recommended for you and due dates:  Health Maintenance  Topic Date Due  . Eye exam for diabetics  05/24/2017  . Urine Protein Check  09/21/2017  . Flu Shot  11/03/2017  . Complete foot exam   01/24/2018  . Tetanus Vaccine  06/11/2019  . DEXA scan (bone density measurement)  Completed  . Pneumonia vaccines  Completed   It is important to avoid accidents which may result in broken bones.  Here are a few ideas on how to make your home safer so you will be less likely to trip or fall.  1. Use nonskid mats or non slip strips in your shower or tub, on your bathroom floor and around sinks.  If you know that you have spilled water, wipe it up! 2. In the bathroom, it is important to have properly installed grab bars on the walls or on the edge of the tub.  Towel racks are NOT strong enough for you to hold onto or to pull on for support. 3. Stairs and hallways should have enough light.  Add lamps or night lights if you need ore light. 4. It is good to have handrails on both sides of the stairs if possible.  Always fix broken handrails right away. 5. It is important to see the edges of  steps.  Paint the edges of outdoor steps white so you can see them better.  Put colored tape on the edge of inside steps. 6. Throw-rugs are dangerous because they can slide.  Removing the rugs is the best idea, but if they must stay, add adhesive carpet tape to prevent slipping. 7. Do not keep things on stairs or in the halls.  Remove small furniture that blocks the halls as it may cause you to trip.  Keep telephone and electrical cords out of the way where you walk. 8. Always were sturdy, rubber-soled shoes for good support.  Never wear just socks, especially on the stairs.  Socks may cause you to slip or fall.  Do not wear full-length housecoats as you can easily trip on the bottom.  9. Place the things you use the most on the shelves that are the easiest to reach.  If you use a stepstool, make sure it is in good condition.  If you feel unsteady, DO NOT climb, ask for help. 10. If a health professional advises you to use a cane or walker, do not be ashamed.  These items can keep you from falling and breaking your bones.

## 2017-09-22 NOTE — Progress Notes (Addendum)
Subjective:   Tami Jacobson is a 84 y.o. female who presents for Medicare Annual (Subsequent) preventive examination.  Review of Systems:  No ROS.  Medicare Wellness Visit. Additional risk factors are reflected in the social history.  Cardiac Risk Factors include: advanced age (>80mn, >>1women);diabetes mellitus;dyslipidemia;hypertension Sleep patterns: feels rested on waking, gets up 1 times nightly to void and sleeps 7-8 hours nightly.    Home Safety/Smoke Alarms: Feels safe in home. Smoke alarms in place.  Living environment; residence and Firearm Safety: 1-story house/ trailer, no firearms. Lives alone, no needs for DME, good support system Seat Belt Safety/Bike Helmet: Wears seat belt.      Objective:     Vitals: BP 117/62   Pulse 62   Resp 18   Ht '5\' 3"'  (1.6 m)   Wt 133 lb (60.3 kg)   SpO2 98%   BMI 23.56 kg/m   Body mass index is 23.56 kg/m.  Advanced Directives 09/22/2017 02/28/2017 12/02/2016 12/02/2016 11/18/2016 11/11/2016 10/28/2016  Does Patient Have a Medical Advance Directive? Yes Yes No Yes Yes Yes Yes  Type of [place] - - Living will Living will [place] will  Does patient want to make changes to medical advance directive? - - - - No - Patient declined No - Patient declined -  Copy of [place]? No - copy requested - - - No - copy requested - No - copy requested  Pre-existing out of facility DNR order (yellow form or pink MOST form) - - - - - - -    Tobacco Social History   Tobacco Use  Smoking Status Never Smoker  Smokeless Tobacco Never Used     Counseling given: Not Answered  Past Medical History:  Diagnosis Date  . Arthritis    left hip  . Breast cancer, right [place] 11/[place]  . Family history of breast cancer   . Family history of colon cancer   . Family history of pancreatic cancer   . GERD  (gastroesophageal reflux disease)   . Non-insulin dependent type 2 diabetes mellitus ([place]   . PAC (premature atrial contraction)   . Personal history of radiation therapy 12/2016  . Premature ventricular contraction   . Seasonal allergies   . Sensitive skin   . Vitamin B12 deficiency    Past Surgical History:  Procedure Laterality Date  . ABDOMINAL HYSTERECTOMY  1979   partial  . BREAST BIOPSY  07/02/2011   Procedure: BREAST BIOPSY WITH NEEDLE LOCALIZATION;  Surgeon: DShann Medal MD;  [facility]  Service: General;  Laterality: Left;  left breast atypical hyperplasia needle localization biopsy  . BREAST BIOPSY Left 06/13/2006  . BREAST BIOPSY Right 06/27/2008  . BREAST BIOPSY Right 09/27/2016  . BREAST EXCISIONAL BIOPSY Left 07/02/2011  . BREAST LUMPECTOMY Left 07/04/2006  . BREAST LUMPECTOMY Right 06/26/2008  . BREAST LUMPECTOMY Right 09/27/2016  . BREAST LUMPECTOMY WITH RADIOACTIVE SEED LOCALIZATION Right 11/18/2016   Procedure: RIGHT BREAST LUMPECTOMY WITH RADIOACTIVE SEED LOCALIZATION ERAS PATHWAY;  Surgeon: NAlphonsa Overall MD;  [place] [facility]  Service: General;  Laterality: Right;  ERAS PATHWAY  . CATARACT EXTRACTION Bilateral   . CHOLECYSTECTOMY, LAPAROSCOPIC  07/07/2001  . ESOPHAGEAL DILATION  2006  . LAPAROSCOPIC APPENDECTOMY  07/07/2001  . TONSILLECTOMY  1942   Family History  Problem Relation Age of Onset  . Colon cancer Mother 622 .  Heart attack Mother   . Breast cancer Sister 39  . Heart disease Maternal Uncle        CABG  . Breast cancer Sister 44  . Heart disease Brother   . Other Father        suicide  . Breast cancer Sister 31  . Heart disease Sister   . COPD Brother   . Heart disease Brother   . Breast cancer Other 57       niece - brother's daughter  . Pancreatic cancer Other        brother's son, dx in his 90s   Social History   Socioeconomic History  . Marital status: Divorced    Spouse name: Marcello Moores  . Number of  children: 1  . Years of education: Not on file  . Highest education level: Not on file  Occupational History  . Not on file  Social Needs  . Financial resource strain: Not hard at all  . Food insecurity:    Worry: Never true    Inability: Never true  . Transportation needs:    Medical: No    Non-medical: No  Tobacco Use  . Smoking status: Never Smoker  . Smokeless tobacco: Never Used  Substance and Sexual Activity  . Alcohol use: No    Alcohol/week: 0.0 oz  . Drug use: No  . Sexual activity: Never  Lifestyle  . Physical activity:    Days per week: 4 days    Minutes per session: 40 min  . Stress: Not at all  Relationships  . Social connections:    Talks on phone: More than three times a week    Gets together: More than three times a week    Attends religious service: More than 4 times per year    Active member of club or organization: Not on file    Attends meetings of clubs or organizations: 1 to 4 times per year    Relationship status: Divorced  Other Topics Concern  . Not on file  Social History Narrative   Retired Network engineer   Married, lives with spouse    Outpatient Encounter Medications as of 09/22/2017  Medication Sig  . blood glucose meter kit and supplies KIT Dispense based on patient and insurance preference. Check sugars once daily and as needed as directed.  . clobetasol cream (TEMOVATE) 5.05 % 1 application as needed. Irritated skin  . cyanocobalamin (,VITAMIN B-12,) 1000 MCG/ML injection Inject 1,000 mcg into the muscle every 30 (thirty) days.   . flecainide (TAMBOCOR) 50 MG tablet Take 1 tablet (50 mg total) by mouth 2 (two) times daily. (Patient taking differently: Take 50 mg by mouth 2 (two) times daily. )  . glucose blood test strip accu check test strips DxE11.09--use to check blood sugars once daily  . metFORMIN (GLUCOPHAGE-XR) 500 MG 24 hr tablet TAKE 1 TABLET BY MOUTH ONCE DAILY WITH BREAKFAST  . metoprolol succinate (TOPROL-XL) 50 MG 24 hr tablet  Take 1 tablet (50 mg total) by mouth daily.  . naproxen sodium (ANAPROX) 220 MG tablet Take 220 mg by mouth as needed.  Marland Kitchen omeprazole (PRILOSEC) 20 MG capsule Take 1 capsule (20 mg total) by mouth daily.  . tamoxifen (NOLVADEX) 20 MG tablet Take 1 tablet (20 mg total) by mouth daily.  Marland Kitchen aspirin EC 81 MG tablet Take 1 tablet (81 mg total) by mouth daily. (Patient not taking: Reported on 09/22/2017)   No facility-administered encounter medications on file as of 09/22/2017.  Activities of Daily Living In your present state of health, do you have any difficulty performing the following activities: 09/22/2017 11/18/2016  Hearing? N N  Vision? N N  Difficulty concentrating or making decisions? N N  Walking or climbing stairs? N N  Dressing or bathing? N N  Doing errands, shopping? N -  Preparing Food and eating ? N -  Using the Toilet? N -  In the past six months, have you accidently leaked urine? N -  Do you have problems with loss of bowel control? N -  Managing your Medications? N -  Managing your Finances? N -  Housekeeping or managing your Housekeeping? N -  Some recent data might be hidden    Patient Care Team: Binnie Rail, MD as PCP - General (Internal Medicine) Stanford Breed, Denice Bors, MD as Consulting Physician (Cardiology) Magrinat, Virgie Dad, MD as Consulting Physician (Hematology and Oncology) Newton Pigg, MD as Consulting Physician (Obstetrics and Gynecology) Alphonsa Overall, MD as Consulting Physician (General Surgery) Rozetta Nunnery, MD as Consulting Physician (Otolaryngology) Kyung Rudd, MD as Consulting Physician (Radiation Oncology) Delice Bison Charlestine Massed, NP as Nurse Practitioner (Hematology and Oncology)    Assessment:   This is a routine wellness examination for Janaisa. Physical assessment deferred to PCP.   Exercise Activities and Dietary recommendations Current Exercise Habits: The patient does not participate in regular exercise at present, Exercise  limited by: None identified  Diet (meal preparation, eat out, water intake, caffeinated beverages, dairy products, fruits and vegetables): in general, a "healthy" diet  , well balanced   Reviewed heart healthy and diabetic diet. Encouraged patient to increase daily water, fluid intake, and continue to drink glucerna (coupons were provided).  Goals    . Patient Stated     I want to continue to do the crossword puzzle and read as much as possible. I will stay socially active by doing things with my friend, starting to volunteer, and go to church.       Fall Risk Fall Risk  09/22/2017 02/28/2017 12/02/2016 12/02/2016 10/28/2016  Falls in the past year? No No No No No    Depression Screen PHQ 2/9 Scores 09/22/2017 02/28/2017 09/27/2016 09/15/2016  PHQ - 2 Score 1 1 0 1  PHQ- 9 Score 3 - - -     Cognitive Function MMSE - Mini Mental State Exam 09/22/2017 09/27/2016  Not completed: - Refused  Orientation to time 5 -  Orientation to Place 5 -  Registration 3 -  Attention/ Calculation 5 -  Recall 3 -  Language- name 2 objects 2 -  Language- repeat 1 -  Language- follow 3 step command 3 -  Language- read & follow direction 1 -  Write a sentence 1 -  Copy design 1 -  Total score 30 -        Immunization History  Administered Date(s) Administered  . Influenza Split 12/14/2011  . Influenza Whole 02/01/2007, 01/03/2009, 12/15/2010  . Influenza, High Dose Seasonal PF 01/01/2014, 12/25/2014, 12/15/2015, 12/24/2016  . Influenza,inj,Quad PF,6+ Mos 12/26/2012  . Pneumococcal Conjugate-13 07/02/2014  . Pneumococcal Polysaccharide-23 02/16/2002, 02/09/2012  . Td 06/10/2009   Screening Tests Health Maintenance  Topic Date Due  . OPHTHALMOLOGY EXAM  05/24/2017  . URINE MICROALBUMIN  09/21/2017  . INFLUENZA VACCINE  11/03/2017  . FOOT EXAM  01/24/2018  . TETANUS/TDAP  06/11/2019  . DEXA SCAN  Completed  . PNA vac Low Risk Adult  Completed      Plan:  Resources for social  activities were provided.  Continue doing brain stimulating activities (puzzles, reading, adult coloring books, staying active) to keep memory sharp.   Continue to eat heart healthy diet (full of fruits, vegetables, whole grains, lean protein, water--limit salt, fat, and sugar intake) and increase physical activity as tolerated.  I have personally reviewed and noted the following in the patient's chart:   . Medical and social history . Use of alcohol, tobacco or illicit drugs  . Current medications and supplements . Functional ability and status . Nutritional status . Physical activity . Advanced directives . List of other physicians . Vitals . Screenings to include cognitive, depression, and falls . Referrals and appointments  In addition, I have reviewed and discussed with patient certain preventive protocols, quality metrics, and best practice recommendations. A written personalized care plan for preventive services as well as general preventive health recommendations were provided to patient.     Michiel Cowboy, RN  09/22/2017   Medical screening examination/treatment/procedure(s) were performed by non-physician practitioner and as supervising physician I was immediately available for consultation/collaboration. I agree with above. Binnie Rail, MD

## 2017-09-29 ENCOUNTER — Ambulatory Visit: Payer: Medicare Other

## 2017-10-14 ENCOUNTER — Ambulatory Visit (INDEPENDENT_AMBULATORY_CARE_PROVIDER_SITE_OTHER): Payer: Medicare Other

## 2017-10-14 DIAGNOSIS — E538 Deficiency of other specified B group vitamins: Secondary | ICD-10-CM

## 2017-10-14 MED ORDER — CYANOCOBALAMIN 1000 MCG/ML IJ SOLN
1000.0000 ug | Freq: Once | INTRAMUSCULAR | Status: AC
  Administered 2017-10-14: 1000 ug via INTRAMUSCULAR

## 2017-10-14 NOTE — Progress Notes (Signed)
b12 Injection given.   Karuna Balducci J Kadesia Robel, MD  

## 2017-10-14 NOTE — Progress Notes (Signed)
cyan 

## 2017-10-21 ENCOUNTER — Other Ambulatory Visit: Payer: Self-pay | Admitting: Internal Medicine

## 2017-11-14 ENCOUNTER — Ambulatory Visit (INDEPENDENT_AMBULATORY_CARE_PROVIDER_SITE_OTHER): Payer: Medicare Other

## 2017-11-14 ENCOUNTER — Telehealth: Payer: Self-pay

## 2017-11-14 DIAGNOSIS — E538 Deficiency of other specified B group vitamins: Secondary | ICD-10-CM | POA: Diagnosis not present

## 2017-11-14 MED ORDER — CYANOCOBALAMIN 1000 MCG/ML IJ SOLN
1000.0000 ug | Freq: Once | INTRAMUSCULAR | Status: AC
  Administered 2017-11-14: 1000 ug via INTRAMUSCULAR

## 2017-11-14 NOTE — Progress Notes (Signed)
b12 Injection given.   Tami Lacks J Jadae Steinke, MD  

## 2017-11-14 NOTE — Telephone Encounter (Signed)
Yes, try zyrtec for a few weeks to see if that helps.

## 2017-11-14 NOTE — Telephone Encounter (Signed)
Patient would like advisement on what to do if her eyes/nose has been running/dripping a lot this summer--she thinks it may be due to allergies--but her eye doctor states there's nothing wrong with her eyes/vision---are you ok if patient starts taking Zyrtec to see if this will help, or is there something else you can recommend her doing----routing to dr burns, please advise, I will call patient back at her home number on chart, thanks

## 2017-11-14 NOTE — Telephone Encounter (Signed)
Patient advised of dr burns note/instructions, she will follow up with dr burns if no better

## 2017-11-28 DIAGNOSIS — H6123 Impacted cerumen, bilateral: Secondary | ICD-10-CM | POA: Diagnosis not present

## 2017-11-28 DIAGNOSIS — J310 Chronic rhinitis: Secondary | ICD-10-CM | POA: Diagnosis not present

## 2017-12-15 ENCOUNTER — Ambulatory Visit (INDEPENDENT_AMBULATORY_CARE_PROVIDER_SITE_OTHER): Payer: Medicare Other

## 2017-12-15 DIAGNOSIS — E538 Deficiency of other specified B group vitamins: Secondary | ICD-10-CM | POA: Diagnosis not present

## 2017-12-15 MED ORDER — CYANOCOBALAMIN 1000 MCG/ML IJ SOLN
1000.0000 ug | Freq: Once | INTRAMUSCULAR | Status: AC
  Administered 2017-12-15: 1000 ug via INTRAMUSCULAR

## 2017-12-15 NOTE — Progress Notes (Signed)
b12 Injection given.   Jahnaya Branscome J Osric Klopf, MD  

## 2017-12-26 NOTE — Patient Instructions (Addendum)
  Tests ordered today. Your results will be released to MyChart (or called to you) after review, usually within 72hours after test completion. If any changes need to be made, you will be notified at that same time.  Flu immunization administered today.    Medications reviewed and updated.  Changes include :   none    Please followup in 6 months   

## 2017-12-26 NOTE — Progress Notes (Signed)
Subjective:    Patient ID: Tami Jacobson, female    DOB: [birth year], 84 y.o.   MRN: [id number]  HPI The patient is here for follow up.  Hypertension: She is taking her medication daily. She is compliant with a low sodium diet.  She denies chest pain, palpitations, edema, shortness of breath and regular headaches. She is not exercising regularly.  She does not monitor her blood pressure at home.    Diabetes: She is taking her medication daily as prescribed. She is compliant with a diabetic diet. She is not exercising regularly.  She is up-to-date with an ophthalmology examination.   GERD:  She is taking her medication daily as prescribed.  She denies any GERD symptoms and feels her GERD is well controlled.   She eats a good breakfast every morning .  She typically eats oatmeal, on occasion she will have eggs and toast.  The rest of the day she eats lunch out - chicken sandwich or biscuit.   At night she does not know what she wants for supper - eats small dinner.  She can cook something and start eating it and then feels like she didn't want it.  It varies which she eats.  She is not losing weight.  She is just concerned that she cannot find anything that she wants to eat.  B12 def:  She is getting monthly B12 injections.  Her last injection was 2 weeks ago.  She was concerned if her level was still okay.  Depression, anxiety: S she did not tolerate the antidepressant we discontinued it.  At that time she did not want to take any other medication.  She feels her depression and anxiety are currently okay without medication.  She does not want to try anything at this time as far as medication.     Medications and allergies reviewed with patient and updated if appropriate.  Patient Active Problem List   Diagnosis Date Noted  . Genetic testing 08/30/2017  . Adjustment disorder with mixed anxiety and depressed mood 06/01/2017  . Family history of breast cancer   . Family history of  colon cancer   . Family history of pancreatic cancer   . Malignant neoplasm of lower-inner quadrant of right breast of female, estrogen receptor positive ([place]) 10/28/2016  . Abnormal electrocardiogram 05/05/2015  . BPPV (benign paroxysmal positional vertigo) 10/01/2014  . Murmur 04/18/2014  . Anal fissure 12/16/2011  . Fibrocystic breast changes. Left.  Biopsy 07/02/2011. 07/14/2011  . History of breast cancer, Right, T1c, N0, lumpectomy 06/26/2008.   Left, T1c, N0, lumpectomy 06/24/2006. 05/12/2011  . Diabetes mellitus type 2, controlled ([place]) 06/11/2009  . PREMATURE VENTRICULAR CONTRACTIONS 08/19/2008  . B12 deficiency 08/29/2007  . ESOPHAGEAL STRICTURE 04/27/2007  . GERD (gastroesophageal reflux disease) 04/27/2007  . DIVERTICULOSIS, COLON 04/27/2007  . OTHER ALOPECIA 02/14/2007  . HYPERLIPIDEMIA 01/02/2007  . Essential hypertension 01/02/2007  . Carotid stenosis 08/22/2006    Current Outpatient Medications on File Prior to Visit  Medication Sig Dispense Refill  . aspirin EC 81 MG tablet Take 1 tablet (81 mg total) by mouth daily. 90 tablet 3  . blood glucose meter kit and supplies KIT Dispense based on patient and insurance preference. Check sugars once daily and as needed as directed. 1 each 0  . clobetasol cream (TEMOVATE) 9.67 % 1 application as needed. Irritated skin    . cyanocobalamin (,VITAMIN B-12,) 1000 MCG/ML injection Inject 1,000 mcg into the muscle every 30 (thirty) days.     Marland Kitchen  flecainide (TAMBOCOR) 50 MG tablet Take 1 tablet (50 mg total) by mouth 2 (two) times daily. (Patient taking differently: Take 50 mg by mouth 2 (two) times daily. ) 180 tablet 3  . glucose blood test strip accu check test strips DxE11.09--use to check blood sugars once daily 100 each 12  . metFORMIN (GLUCOPHAGE-XR) 500 MG 24 hr tablet TAKE 1 TABLET BY MOUTH ONCE DAILY WITH BREAKFAST 90 tablet 1  . metoprolol succinate (TOPROL-XL) 50 MG 24 hr tablet Take 1 tablet (50 mg total) by mouth daily. 90  tablet 3  . naproxen sodium (ANAPROX) 220 MG tablet Take 220 mg by mouth as needed.    Marland Kitchen omeprazole (PRILOSEC) 20 MG capsule Take 1 capsule (20 mg total) by mouth daily. 90 capsule 3  . tamoxifen (NOLVADEX) 20 MG tablet Take 1 tablet (20 mg total) by mouth daily. 90 tablet 2   No current facility-administered medications on file prior to visit.     Past Medical History:  Diagnosis Date  . Arthritis    left hip  . Breast cancer, right ([place]) 11/2016  . Family history of breast cancer   . Family history of colon cancer   . Family history of pancreatic cancer   . GERD (gastroesophageal reflux disease)   . Non-insulin dependent type 2 diabetes mellitus ([place])   . PAC (premature atrial contraction)   . Personal history of radiation therapy 12/2016  . Premature ventricular contraction   . Seasonal allergies   . Sensitive skin   . Vitamin B12 deficiency     Past Surgical History:  Procedure Laterality Date  . ABDOMINAL HYSTERECTOMY  1979   partial  . BREAST BIOPSY  07/02/2011   Procedure: BREAST BIOPSY WITH NEEDLE LOCALIZATION;  Surgeon: Shann Medal, MD;  Location: [facility];  Service: General;  Laterality: Left;  left breast atypical hyperplasia needle localization biopsy  . BREAST BIOPSY Left 06/13/2006  . BREAST BIOPSY Right 06/27/2008  . BREAST BIOPSY Right 09/27/2016  . BREAST EXCISIONAL BIOPSY Left 07/02/2011  . BREAST LUMPECTOMY Left 07/04/2006  . BREAST LUMPECTOMY Right 06/26/2008  . BREAST LUMPECTOMY Right 09/27/2016  . BREAST LUMPECTOMY WITH RADIOACTIVE SEED LOCALIZATION Right 11/18/2016   Procedure: RIGHT BREAST LUMPECTOMY WITH RADIOACTIVE SEED LOCALIZATION ERAS PATHWAY;  Surgeon: Alphonsa Overall, MD;  Location: [facility];  Service: General;  Laterality: Right;  ERAS PATHWAY  . CATARACT EXTRACTION Bilateral   . CHOLECYSTECTOMY, LAPAROSCOPIC  07/07/2001  . ESOPHAGEAL DILATION  2006  . LAPAROSCOPIC APPENDECTOMY  07/07/2001  . TONSILLECTOMY  1942    Social  History   Socioeconomic History  . Marital status: Divorced    Spouse name: Marcello Moores  . Number of children: 1  . Years of education: Not on file  . Highest education level: Not on file  Occupational History  . Not on file  Social Needs  . Financial resource strain: Not hard at all  . Food insecurity:    Worry: Never true    Inability: Never true  . Transportation needs:    Medical: No    Non-medical: No  Tobacco Use  . Smoking status: Never Smoker  . Smokeless tobacco: Never Used  Substance and Sexual Activity  . Alcohol use: No    Alcohol/week: 0.0 standard drinks  . Drug use: No  . Sexual activity: Never  Lifestyle  . Physical activity:    Days per week: 4 days    Minutes per session: 40 min  . Stress: Not at all  Relationships  . Social connections:    Talks on phone: More than three times a week    Gets together: More than three times a week    Attends religious service: More than 4 times per year    Active member of club or organization: Not on file    Attends meetings of clubs or organizations: 1 to 4 times per year    Relationship status: Divorced  Other Topics Concern  . Not on file  Social History Narrative   Retired Network engineer   Married, lives with spouse    Family History  Problem Relation Age of Onset  . Colon cancer Mother 27  . Heart attack Mother   . Breast cancer Sister 22  . Heart disease Maternal Uncle        CABG  . Breast cancer Sister 89  . Heart disease Brother   . Other Father        suicide  . Breast cancer Sister 15  . Heart disease Sister   . COPD Brother   . Heart disease Brother   . Breast cancer Other 79       niece - brother's daughter  . Pancreatic cancer Other        brother's son, dx in his 26s    Review of Systems  Constitutional: Negative for chills and fever.  Respiratory: Negative for cough, shortness of breath and wheezing.   Cardiovascular: Positive for palpitations (sometimes at night). Negative for chest pain  and leg swelling.  Neurological: Negative for headaches.       Objective:   Vitals:   12/27/17 1046  BP: (!) 148/74  Pulse: 87  Resp: 16  Temp: 98.6 F (37 C)  SpO2: 94%   BP Readings from Last 3 Encounters:  12/27/17 (!) 148/74  09/22/17 117/62  07/25/17 (!) 135/50   Wt Readings from Last 3 Encounters:  12/27/17 133 lb 12.8 oz (60.7 kg)  09/22/17 133 lb (60.3 kg)  07/25/17 131 lb 4.8 oz (59.6 kg)   Body mass index is 23.7 kg/m.   Physical Exam    Constitutional: Appears well-developed and well-nourished. No distress.  HENT:  Head: Normocephalic and atraumatic.  Neck: Neck supple. No tracheal deviation present. No thyromegaly present.  No cervical lymphadenopathy Cardiovascular: Normal rate, regular rhythm and normal heart sounds.   2/6 systolic murmur heard. No carotid bruit .  No edema Pulmonary/Chest: Effort normal and breath sounds normal. No respiratory distress. No has no wheezes. No rales.  Skin: Skin is warm and dry. Not diaphoretic.  Psychiatric: Normal mood and affect. Behavior is normal.      Assessment & Plan:    See Problem List for Assessment and Plan of chronic medical problems.

## 2017-12-27 ENCOUNTER — Ambulatory Visit (INDEPENDENT_AMBULATORY_CARE_PROVIDER_SITE_OTHER): Payer: Medicare Other | Admitting: Internal Medicine

## 2017-12-27 ENCOUNTER — Encounter: Payer: Self-pay | Admitting: Internal Medicine

## 2017-12-27 ENCOUNTER — Other Ambulatory Visit (INDEPENDENT_AMBULATORY_CARE_PROVIDER_SITE_OTHER): Payer: Medicare Other

## 2017-12-27 VITALS — BP 148/74 | HR 87 | Temp 98.6°F | Resp 16 | Ht 63.0 in | Wt 133.8 lb

## 2017-12-27 DIAGNOSIS — E538 Deficiency of other specified B group vitamins: Secondary | ICD-10-CM

## 2017-12-27 DIAGNOSIS — E119 Type 2 diabetes mellitus without complications: Secondary | ICD-10-CM | POA: Diagnosis not present

## 2017-12-27 DIAGNOSIS — F4323 Adjustment disorder with mixed anxiety and depressed mood: Secondary | ICD-10-CM

## 2017-12-27 DIAGNOSIS — K219 Gastro-esophageal reflux disease without esophagitis: Secondary | ICD-10-CM

## 2017-12-27 DIAGNOSIS — I10 Essential (primary) hypertension: Secondary | ICD-10-CM

## 2017-12-27 DIAGNOSIS — Z23 Encounter for immunization: Secondary | ICD-10-CM | POA: Diagnosis not present

## 2017-12-27 DIAGNOSIS — E782 Mixed hyperlipidemia: Secondary | ICD-10-CM

## 2017-12-27 LAB — COMPREHENSIVE METABOLIC PANEL
ALBUMIN: 4 g/dL (ref 3.5–5.2)
ALT: 10 U/L (ref 0–35)
AST: 11 U/L (ref 0–37)
Alkaline Phosphatase: 34 U/L — ABNORMAL LOW (ref 39–117)
BILIRUBIN TOTAL: 0.5 mg/dL (ref 0.2–1.2)
BUN: 15 mg/dL (ref 6–23)
CALCIUM: 9.9 mg/dL (ref 8.4–10.5)
CHLORIDE: 104 meq/L (ref 96–112)
CO2: 30 mEq/L (ref 19–32)
Creatinine, Ser: 1.07 mg/dL (ref 0.40–1.20)
GFR: 51.41 mL/min — ABNORMAL LOW (ref >60.00)
Glucose, Bld: 152 mg/dL — ABNORMAL HIGH (ref 70–99)
Potassium: 4.8 mEq/L (ref 3.5–5.1)
SODIUM: 140 meq/L (ref 135–145)
Total Protein: 6.9 g/dL (ref 6.0–8.3)

## 2017-12-27 LAB — LIPID PANEL
CHOL/HDL RATIO: 3
Cholesterol: 168 mg/dL (ref 0–200)
HDL: 49.5 mg/dL (ref >39.00)
LDL Cholesterol: 90 mg/dL (ref 0–99)
NonHDL: 118.89
TRIGLYCERIDES: 143 mg/dL (ref 0.0–149.0)
VLDL: 28.6 mg/dL (ref 0.0–40.0)

## 2017-12-27 LAB — HEMOGLOBIN A1C: Hgb A1c MFr Bld: 6.6 % — ABNORMAL HIGH (ref 4.6–6.5)

## 2017-12-27 LAB — VITAMIN B12: Vitamin B-12: 536 pg/mL (ref 211–911)

## 2017-12-27 NOTE — Assessment & Plan Note (Signed)
Not currently on any medication Feels her depression and anxiety are okay We will monitor Encouraged regular exercise and involvement in activities

## 2017-12-27 NOTE — Assessment & Plan Note (Addendum)
GERD controlled Try omeprazole QOD - taper off if possible

## 2017-12-27 NOTE — Assessment & Plan Note (Signed)
Intolerance of statins We will check lipid panel, CMP Encouraged regular exercise

## 2017-12-27 NOTE — Assessment & Plan Note (Signed)
Blood pressure slightly elevated here today, but the last 2 measures were normal Continue current doses of medications CMP Encouraged regular exercise

## 2017-12-27 NOTE — Assessment & Plan Note (Signed)
Continue metformin Will check A1c Encouraged regular exercise

## 2017-12-27 NOTE — Assessment & Plan Note (Signed)
Getting B12 injections monthly-we will continue Will check B12 level

## 2018-01-10 ENCOUNTER — Encounter: Payer: Self-pay | Admitting: Internal Medicine

## 2018-01-12 ENCOUNTER — Ambulatory Visit (INDEPENDENT_AMBULATORY_CARE_PROVIDER_SITE_OTHER): Payer: Medicare Other | Admitting: *Deleted

## 2018-01-12 DIAGNOSIS — E538 Deficiency of other specified B group vitamins: Secondary | ICD-10-CM | POA: Diagnosis not present

## 2018-01-12 MED ORDER — CYANOCOBALAMIN 1000 MCG/ML IJ SOLN
1000.0000 ug | Freq: Once | INTRAMUSCULAR | Status: AC
  Administered 2018-01-12: 1000 ug via INTRAMUSCULAR

## 2018-01-16 DIAGNOSIS — Z170 Estrogen receptor positive status [ER+]: Secondary | ICD-10-CM | POA: Diagnosis not present

## 2018-01-16 DIAGNOSIS — C50911 Malignant neoplasm of unspecified site of right female breast: Secondary | ICD-10-CM | POA: Diagnosis not present

## 2018-02-12 NOTE — Progress Notes (Signed)
[facility]  [phone] Fax:([phone]) [phone]     ID: Tami Jacobson DOB: October 16, 1927  MR#: [id number]  ZGY#:[id number]  Patient Care Team: Binnie Rail, MD as PCP - General (Internal Medicine) Stanford Breed Denice Bors, MD as Consulting Physician (Cardiology) Magrinat, Virgie Dad, MD as Consulting Physician (Hematology and Oncology) Newton Pigg, MD as Consulting Physician (Obstetrics and Gynecology) Alphonsa Overall, MD as Consulting Physician (General Surgery) Rozetta Nunnery, MD as Consulting Physician (Otolaryngology) Kyung Rudd, MD as Consulting Physician (Radiation Oncology) Delice Bison, Charlestine Massed, NP as Nurse Practitioner (Hematology and Oncology) OTHER MD:  CHIEF COMPLAINT: Estrogen receptor positive breast cancer  CURRENT TREATMENT:  tamoxifen  BREAST CANCER HISTORY: From the recent update summary:  The patient had bilateral screening mammography with tomography at the [facility] 06/28/2016 showing only post surgical changes in both breasts. The patient had a history of right lateral breast cancer diagnosed in 2010, T1 cN0, status post lumpectomy March of that year, and she also has a history of T1 CN 0 left breast cancer status post lumpectomy in 2008. She had excisional biopsy on the left of what proved to be atypical lobular hyperplasia March 2013.  On 09/23/2016 the patient was evaluated for pain in the inferior right breast and a feeling of lumpiness. Right [facility] found the breast density to be category C. In the right breast there was skin thickening around the areola. This appeared unchanged. On tomography however there was a suggestion of a small spiculated mass in the inferior central right breast measuring 0.5 cm. This was palpable at the 5:00 position 2 cm from the nipple. Targeted ultrasonography confirmed an irregular mass in this position measuring 0.8 cm. Ultrasound of the right  axilla was negative.  Biopsy of this right breast lower inner quadrant mass 09/27/2016 showed ([id number]) invasive lobular carcinoma, E-cadherin negative, grade not stated, estrogen receptor 80% positive, progesterone receptor 5% positive, both with strong staining intensity, with an MIB-1 of 5%, and no HER-2 amplification, the signals ratio being 1.21 and the number per cell 2.30.  The patient's subsequent history is as detailed below.  INTERVAL HISTORY: Tami Jacobson returns today for follow-up and treatment of her estrogen receptor positive breast cancer. She continues on tamoxifen, with good tolerance.  She says she cannot tell any side effects such as hot flashes or arthralgias.  Since her last visit, she underwent a bilateral diagnostic mammogram in 08/2017 that showed no mammographic evidence of malignancy and breast density category C.     REVIEW OF SYSTEMS: Tami Jacobson is feeling well today.  She notes intermittent breast soreness since surgery, but it is not particularly worse.  She does not get much exercise, since she enjoys reading so much.  She does walk around the house.   She knows she needs to exercise more, but hasn't.  Otherwise she is doing well today and a detailed ROS is non contributory.      PAST MEDICAL HISTORY: Past Medical History:  Diagnosis Date  . Arthritis    left hip  . Breast cancer, right ([place]) 11/2016  . Family history of breast cancer   . Family history of colon cancer   . Family history of pancreatic cancer   . GERD (gastroesophageal reflux disease)   . Non-insulin dependent type 2 diabetes mellitus ([place])   . PAC (premature atrial contraction)   . Personal history of radiation therapy 12/2016  . Premature ventricular contraction   . Seasonal allergies   .  Sensitive skin   . Vitamin B12 deficiency     PAST SURGICAL HISTORY: Past Surgical History:  Procedure Laterality Date  . ABDOMINAL HYSTERECTOMY  1979   partial  . BREAST BIOPSY  07/02/2011    Procedure: BREAST BIOPSY WITH NEEDLE LOCALIZATION;  Surgeon: Shann Medal, MD;  Location: [facility];  Service: General;  Laterality: Left;  left breast atypical hyperplasia needle localization biopsy  . BREAST BIOPSY Left 06/13/2006  . BREAST BIOPSY Right 06/27/2008  . BREAST BIOPSY Right 09/27/2016  . BREAST EXCISIONAL BIOPSY Left 07/02/2011  . BREAST LUMPECTOMY Left 07/04/2006  . BREAST LUMPECTOMY Right 06/26/2008  . BREAST LUMPECTOMY Right 09/27/2016  . BREAST LUMPECTOMY WITH RADIOACTIVE SEED LOCALIZATION Right 11/18/2016   Procedure: RIGHT BREAST LUMPECTOMY WITH RADIOACTIVE SEED LOCALIZATION ERAS PATHWAY;  Surgeon: Alphonsa Overall, MD;  Location: [facility];  Service: General;  Laterality: Right;  ERAS PATHWAY  . CATARACT EXTRACTION Bilateral   . CHOLECYSTECTOMY, LAPAROSCOPIC  07/07/2001  . ESOPHAGEAL DILATION  2006  . LAPAROSCOPIC APPENDECTOMY  07/07/2001  . TONSILLECTOMY  1942    FAMILY HISTORY Family History  Problem Relation Age of Onset  . Colon cancer Mother 54  . Heart attack Mother   . Breast cancer Sister 70  . Heart disease Maternal Uncle        CABG  . Breast cancer Sister 47  . Heart disease Brother   . Other Father        suicide  . Breast cancer Sister 19  . Heart disease Sister   . COPD Brother   . Heart disease Brother   . Breast cancer Other 3       niece - brother's daughter  . Pancreatic cancer Other        brother's son, dx in his 61s  The patient's father committed suicide at the age of 7. The patient's mother had a history of colon cancer diagnosed at age 21, she died at the age of 65 from unrelated causes. The patient is 21 sisters. 2 sisters died from breast cancer, 1 diagnosed age 95 diagnosed age 51. A third sister was diagnosed with breast cancer at age 75. The patient has 2 brothers. There is no other history of cancer in the family to the patient's knowledge.  GYNECOLOGIC HISTORY:  GX P1, first live birth age 23. The patient took  Premarin alone for nearly 30 years, status post hysterectomy. No LMP recorded. Patient has had a hysterectomy.   SOCIAL HISTORY: She worked as a Network engineer for [employer]. Her husband of more than 90 year, Marcello Moores, "just left me and moved to an assisted living facility.". Their divorce was finalized August 2018. The patient enters me that her daughter Margarita Grizzle is estranged. The patient lives by herself, with no pets. [place] in [place]   ADVANCED DIRECTIVES: Not in place. At the 10/28/2016 visit the patient was given the appropriate documents to complete and notarize at her discretion.   HEALTH MAINTENANCE: Social History   Tobacco Use  . Smoking status: Never Smoker  . Smokeless tobacco: Never Used  Substance Use Topics  . Alcohol use: No    Alcohol/week: 0.0 standard drinks  . Drug use: No     Colonoscopy:  PAP:  Bone density:   Allergies  Allergen Reactions  . Statins Other (See Comments)    GI UPSET  . Effexor [Venlafaxine]     Constipation, shaking  . Cephalexin Other (See Comments)  UNKNOWN  . Codeine Other (See Comments)    UNKNOWN  . Lansoprazole Other (See Comments)    UNKNOWN  . Latex Other (See Comments)    UNKNOWN   . Levofloxacin Other (See Comments)    UNKNOWN  . Nitrofurantoin Other (See Comments)    UNKNOWN  . Rofecoxib Other (See Comments)    UNKNOWN     Current Outpatient Medications  Medication Sig Dispense Refill  . blood glucose meter kit and supplies KIT Dispense based on patient and insurance preference. Check sugars once daily and as needed as directed. 1 each 0  . clobetasol cream (TEMOVATE) 5.63 % 1 application as needed. Irritated skin    . cyanocobalamin (,VITAMIN B-12,) 1000 MCG/ML injection Inject 1,000 mcg into the muscle every 30 (thirty) days.     . flecainide (TAMBOCOR) 50 MG tablet Take 1 tablet (50 mg total) by mouth 2 (two) times daily. (Patient taking differently: Take 50 mg by mouth 2  (two) times daily. ) 180 tablet 3  . glucose blood test strip accu check test strips DxE11.09--use to check blood sugars once daily 100 each 12  . metFORMIN (GLUCOPHAGE-XR) 500 MG 24 hr tablet TAKE 1 TABLET BY MOUTH ONCE DAILY WITH BREAKFAST 90 tablet 1  . metoprolol succinate (TOPROL-XL) 50 MG 24 hr tablet Take 1 tablet (50 mg total) by mouth daily. 90 tablet 3  . naproxen sodium (ANAPROX) 220 MG tablet Take 220 mg by mouth as needed.    Marland Kitchen omeprazole (PRILOSEC) 20 MG capsule Take 1 capsule (20 mg total) by mouth daily. 90 capsule 3  . tamoxifen (NOLVADEX) 20 MG tablet Take 1 tablet (20 mg total) by mouth daily. 90 tablet 2   No current facility-administered medications for this visit.     OBJECTIVE:  Vitals:   02/13/18 1132  BP: 130/89  Pulse: 68  Resp: 18  Temp: 98.4 F (36.9 C)  SpO2: 100%     Body mass index is 24.16 kg/m.   Wt Readings from Last 3 Encounters:  02/13/18 136 lb 6.4 oz (61.9 kg)  12/27/17 133 lb 12.8 oz (60.7 kg)  09/22/17 133 lb (60.3 kg)      ECOG FS:1 - Symptomatic but completely ambulatory GENERAL: Patient is a well appearing female in no acute distress HEENT:  Sclerae anicteric.  Oropharynx clear and moist. No ulcerations or evidence of oropharyngeal candidiasis. Neck is supple.  NODES:  No cervical, supraclavicular, or axillary lymphadenopathy palpated.  BREAST EXAM:  Right breast s/p lumpectomy, no sign of local recurrence, left breast is benign LUNGS:  Clear to auscultation bilaterally.  No wheezes or rhonchi. HEART:  Regular rate and rhythm. No murmur appreciated. ABDOMEN:  Soft, nontender.  Positive, normoactive bowel sounds. No organomegaly palpated. MSK:  No focal spinal tenderness to palpation. Full range of motion bilaterally in the upper extremities. EXTREMITIES:  No peripheral edema.   SKIN:  Clear with no obvious rashes or skin changes. No nail dyscrasia. NEURO:  Nonfocal. Well oriented.  Appropriate affect.   LAB RESULTS:  CMP       Component Value Date/Time   NA 140 02/13/2018 1054   NA 136 02/28/2017 1453   K 4.6 02/13/2018 1054   K 5.1 02/28/2017 1453   CL 107 02/13/2018 1054   CL 106 07/11/2012 0901   CO2 26 02/13/2018 1054   CO2 26 02/28/2017 1453   GLUCOSE 242 (H) 02/13/2018 1054   GLUCOSE 179 (H) 02/28/2017 1453   GLUCOSE 110 (H) 07/11/2012 0901  BUN 14 02/13/2018 1054   BUN 18.2 02/28/2017 1453   CREATININE 1.31 (H) 02/13/2018 1054   CREATININE 1.1 02/28/2017 1453   CALCIUM 9.4 02/13/2018 1054   CALCIUM 10.1 02/28/2017 1453   PROT 6.4 (L) 02/13/2018 1054   PROT 7.6 02/28/2017 1453   ALBUMIN 3.4 (L) 02/13/2018 1054   ALBUMIN 3.9 02/28/2017 1453   AST 14 (L) 02/13/2018 1054   AST 12 02/28/2017 1453   ALT 13 02/13/2018 1054   ALT 10 02/28/2017 1453   ALKPHOS 38 02/13/2018 1054   ALKPHOS 84 02/28/2017 1453   BILITOT 0.5 02/13/2018 1054   BILITOT 0.70 02/28/2017 1453   GFRNONAA 35 (L) 02/13/2018 1054   GFRAA 41 (L) 02/13/2018 1054    No results found for: TOTALPROTELP, ALBUMINELP, A1GS, A2GS, BETS, BETA2SER, GAMS, MSPIKE, SPEI  No results found for: Nils Pyle, [facility]  Lab Results  Component Value Date   WBC 5.4 02/13/2018   NEUTROABS 4.1 02/13/2018   HGB 11.3 (L) 02/13/2018   HCT 34.5 (L) 02/13/2018   MCV 96.1 02/13/2018   PLT 124 (L) 02/13/2018      Chemistry      Component Value Date/Time   NA 140 02/13/2018 1054   NA 136 02/28/2017 1453   K 4.6 02/13/2018 1054   K 5.1 02/28/2017 1453   CL 107 02/13/2018 1054   CL 106 07/11/2012 0901   CO2 26 02/13/2018 1054   CO2 26 02/28/2017 1453   BUN 14 02/13/2018 1054   BUN 18.2 02/28/2017 1453   CREATININE 1.31 (H) 02/13/2018 1054   CREATININE 1.1 02/28/2017 1453      Component Value Date/Time   CALCIUM 9.4 02/13/2018 1054   CALCIUM 10.1 02/28/2017 1453   ALKPHOS 38 02/13/2018 1054   ALKPHOS 84 02/28/2017 1453   AST 14 (L) 02/13/2018 1054   AST 12 02/28/2017 1453   ALT 13 02/13/2018 1054   ALT 10  02/28/2017 1453   BILITOT 0.5 02/13/2018 1054   BILITOT 0.70 02/28/2017 1453       Lab Results  Component Value Date   LABCA2 18 10/07/2009    No components found for: [id number]  No results for input(s): INR in the last 168 hours.  Urinalysis No results found for: COLORURINE, APPEARANCEUR, LABSPEC, PHURINE, GLUCOSEU, HGBUR, BILIRUBINUR, KETONESUR, PROTEINUR, UROBILINOGEN, NITRITE, LEUKOCYTESUR   STUDIES:    ELIGIBLE FOR AVAILABLE RESEARCH PROTOCOL: no  ASSESSMENT: 84 y.o. [place] woman   (1) status post left lumpectomy March 2008 for a pT1c invasive ductal carcinoma, grade 1, strongly estrogen and progesterone receptor positive, HER-2 not amplified, with a borderline MIB-1  (a) received tamoxifen, but very intermittently  (2) status post right lumpectomy and sentinel lymph node sampling 06/28/2008 for a pT1c pN0, stage IA invasive ductal carcinoma, grade 1, estrogen receptor 96% positive, progesterone receptor negative, HER-2/neu negative, with an MIB-1 of 14%  (a) received anastrozole April 2010 to April 2013  (3) status post excision of an area of lobular carcinoma in situ, left breast, 07/02/2011  (a) on tamoxifen April 2013 to April 2015.  (4) status post biopsy of the right breast lower inner quadrant 09/27/2016 for a clinical T1a N0 invasive lobular carcinoma, E-cadherin negative, estrogen and progesterone receptor positive, with no HER-2 amplification and an MIB-1 of 5%.  (5) right lumpectomy without sentinel lymph node sampling 11/18/2016 found a pT1b cN0, stage IA invasive lobular breast cancer, grade 2, with negative margins  (6) adjuvant radiation 12/21/2016 - 01/17/2017 Site/dose:   Right breast/ 42.5  Gy in 17 fractions                    Boost: 7.5 Gy in 3 fractions  (7) tamoxifen for 5 years started 03/05/2017  (8) offered genetics testing at meeting with genetics counselor 11/15/2016, but declined  PLAN: Tami Jacobson is doing well today.  She is  tolerating the Tamoxifen well and will continue this.  She has no clinical or radiographic sign of breast cancer recurrence.  We reviewed her recommendations which include healthy diet, exercise.  I recommended she keep up with her PCP regularly.  I noted her labs are slightly abnormal today with a mild creatinine elevation.  I recommended she increase her fluid intake and we will forward her lab abnormalities to her PCP.  Terrill will see Dr. Jana Hakim in 08/2018 after her annual mammogram.  She will see Dr. Lucia Gaskins 6 months after that.  She knows to call prior to her next appointment for any questions or concerns.    A total of (30) minutes of face-to-face time was spent with this patient with greater than 50% of that time in counseling and care-coordination.    Wilber Bihari, NP  02/13/18 11:49 AM Medical Oncology and Hematology [facility] [address] [place], [place] [address] Tel. [phone]    Fax. [phone]

## 2018-02-13 ENCOUNTER — Inpatient Hospital Stay: Payer: Medicare Other | Attending: Oncology

## 2018-02-13 ENCOUNTER — Encounter: Payer: Self-pay | Admitting: Adult Health

## 2018-02-13 ENCOUNTER — Telehealth: Payer: Self-pay | Admitting: Adult Health

## 2018-02-13 ENCOUNTER — Inpatient Hospital Stay (HOSPITAL_BASED_OUTPATIENT_CLINIC_OR_DEPARTMENT_OTHER): Payer: Medicare Other | Admitting: Adult Health

## 2018-02-13 ENCOUNTER — Ambulatory Visit (INDEPENDENT_AMBULATORY_CARE_PROVIDER_SITE_OTHER): Payer: Medicare Other

## 2018-02-13 VITALS — BP 130/89 | HR 68 | Temp 98.4°F | Resp 18 | Ht 63.0 in | Wt 136.4 lb

## 2018-02-13 DIAGNOSIS — Z79810 Long term (current) use of selective estrogen receptor modulators (SERMs): Secondary | ICD-10-CM

## 2018-02-13 DIAGNOSIS — Z923 Personal history of irradiation: Secondary | ICD-10-CM | POA: Insufficient documentation

## 2018-02-13 DIAGNOSIS — Z90710 Acquired absence of both cervix and uterus: Secondary | ICD-10-CM

## 2018-02-13 DIAGNOSIS — Z170 Estrogen receptor positive status [ER+]: Secondary | ICD-10-CM | POA: Diagnosis not present

## 2018-02-13 DIAGNOSIS — C50311 Malignant neoplasm of lower-inner quadrant of right female breast: Secondary | ICD-10-CM

## 2018-02-13 DIAGNOSIS — E538 Deficiency of other specified B group vitamins: Secondary | ICD-10-CM

## 2018-02-13 DIAGNOSIS — Z79899 Other long term (current) drug therapy: Secondary | ICD-10-CM | POA: Insufficient documentation

## 2018-02-13 DIAGNOSIS — C50212 Malignant neoplasm of upper-inner quadrant of left female breast: Secondary | ICD-10-CM

## 2018-02-13 LAB — CBC WITH DIFFERENTIAL/PLATELET
ABS IMMATURE GRANULOCYTES: 0.01 10*3/uL (ref 0.00–0.07)
BASOS ABS: 0 10*3/uL (ref 0.0–0.1)
BASOS PCT: 0 %
EOS ABS: 0.1 10*3/uL (ref 0.0–0.5)
Eosinophils Relative: 1 %
HCT: 34.5 % — ABNORMAL LOW (ref 36.0–46.0)
Hemoglobin: 11.3 g/dL — ABNORMAL LOW (ref 12.0–15.0)
Immature Granulocytes: 0 %
Lymphocytes Relative: 17 %
Lymphs Abs: 0.9 10*3/uL (ref 0.7–4.0)
MCH: 31.5 pg (ref 26.0–34.0)
MCHC: 32.8 g/dL (ref 30.0–36.0)
MCV: 96.1 fL (ref 80.0–100.0)
Monocytes Absolute: 0.4 10*3/uL (ref 0.1–1.0)
Monocytes Relative: 7 %
NEUTROS ABS: 4.1 10*3/uL (ref 1.7–7.7)
NEUTROS PCT: 75 %
NRBC: 0 % (ref 0.0–0.2)
PLATELETS: 124 10*3/uL — AB (ref 150–400)
RBC: 3.59 MIL/uL — ABNORMAL LOW (ref 3.87–5.11)
RDW: 13 % (ref 11.5–15.5)
WBC: 5.4 10*3/uL (ref 4.0–10.5)

## 2018-02-13 LAB — COMPREHENSIVE METABOLIC PANEL
ALT: 13 U/L (ref 0–44)
ANION GAP: 7 (ref 5–15)
AST: 14 U/L — ABNORMAL LOW (ref 15–41)
Albumin: 3.4 g/dL — ABNORMAL LOW (ref 3.5–5.0)
Alkaline Phosphatase: 38 U/L (ref 38–126)
BUN: 14 mg/dL (ref 8–23)
CALCIUM: 9.4 mg/dL (ref 8.9–10.3)
CO2: 26 mmol/L (ref 22–32)
Chloride: 107 mmol/L (ref 98–111)
Creatinine, Ser: 1.31 mg/dL — ABNORMAL HIGH (ref 0.44–1.00)
GFR, EST AFRICAN AMERICAN: 41 mL/min — AB (ref >60)
GFR, EST NON AFRICAN AMERICAN: 35 mL/min — AB (ref >60)
Glucose, Bld: 242 mg/dL — ABNORMAL HIGH (ref 70–99)
Potassium: 4.6 mmol/L (ref 3.5–5.1)
Sodium: 140 mmol/L (ref 135–145)
Total Bilirubin: 0.5 mg/dL (ref 0.3–1.2)
Total Protein: 6.4 g/dL — ABNORMAL LOW (ref 6.5–8.1)

## 2018-02-13 MED ORDER — CYANOCOBALAMIN 1000 MCG/ML IJ SOLN
1000.0000 ug | Freq: Once | INTRAMUSCULAR | Status: AC
  Administered 2018-02-13: 1000 ug via INTRAMUSCULAR

## 2018-02-13 NOTE — Progress Notes (Signed)
I have reviewed and agree.

## 2018-02-13 NOTE — Telephone Encounter (Signed)
Printed calendar and avs for April and May 2020

## 2018-02-28 DIAGNOSIS — H02105 Unspecified ectropion of left lower eyelid: Secondary | ICD-10-CM | POA: Diagnosis not present

## 2018-02-28 DIAGNOSIS — H02102 Unspecified ectropion of right lower eyelid: Secondary | ICD-10-CM | POA: Diagnosis not present

## 2018-02-28 DIAGNOSIS — H04223 Epiphora due to insufficient drainage, bilateral lacrimal glands: Secondary | ICD-10-CM | POA: Diagnosis not present

## 2018-03-08 DIAGNOSIS — S80212A Abrasion, left knee, initial encounter: Secondary | ICD-10-CM | POA: Diagnosis not present

## 2018-03-08 DIAGNOSIS — S8002XA Contusion of left knee, initial encounter: Secondary | ICD-10-CM | POA: Diagnosis not present

## 2018-03-08 DIAGNOSIS — M25562 Pain in left knee: Secondary | ICD-10-CM | POA: Diagnosis not present

## 2018-03-16 ENCOUNTER — Ambulatory Visit (INDEPENDENT_AMBULATORY_CARE_PROVIDER_SITE_OTHER): Payer: Medicare Other

## 2018-03-16 DIAGNOSIS — E538 Deficiency of other specified B group vitamins: Secondary | ICD-10-CM

## 2018-03-16 MED ORDER — CYANOCOBALAMIN 1000 MCG/ML IJ SOLN
1000.0000 ug | Freq: Once | INTRAMUSCULAR | Status: AC
  Administered 2018-03-16: 1000 ug via INTRAMUSCULAR

## 2018-03-16 NOTE — Progress Notes (Signed)
b12 Injection given.   Stacy J Burns, MD  

## 2018-03-21 ENCOUNTER — Other Ambulatory Visit: Payer: Self-pay | Admitting: Oncology

## 2018-03-21 ENCOUNTER — Other Ambulatory Visit: Payer: Self-pay | Admitting: Internal Medicine

## 2018-03-21 DIAGNOSIS — M25562 Pain in left knee: Secondary | ICD-10-CM | POA: Diagnosis not present

## 2018-03-21 DIAGNOSIS — Z170 Estrogen receptor positive status [ER+]: Principal | ICD-10-CM

## 2018-03-21 DIAGNOSIS — C50311 Malignant neoplasm of lower-inner quadrant of right female breast: Secondary | ICD-10-CM

## 2018-03-27 ENCOUNTER — Telehealth: Payer: Self-pay | Admitting: Physician Assistant

## 2018-03-27 NOTE — Telephone Encounter (Signed)
The pt was taking omeprazole and stopped because someone told her she should not take it long term and her reflux symptoms returned.  I advised her to restart omeprazole as prescribed and keep the appt as scheduled for 1/2.  The pt has been advised of the information and verbalized understanding.

## 2018-03-27 NOTE — Telephone Encounter (Signed)
Pt states that heartburn has gotten worse, she just made an appt with Amy on 04/06/2018 but wants to know what she can do in the meantime.

## 2018-04-03 DIAGNOSIS — H5000 Unspecified esotropia: Secondary | ICD-10-CM | POA: Diagnosis not present

## 2018-04-06 ENCOUNTER — Encounter: Payer: Self-pay | Admitting: Physician Assistant

## 2018-04-06 ENCOUNTER — Ambulatory Visit: Payer: Medicare Other | Admitting: Physician Assistant

## 2018-04-06 VITALS — BP 134/66 | HR 80 | Ht 63.0 in | Wt 134.0 lb

## 2018-04-06 DIAGNOSIS — K5730 Diverticulosis of large intestine without perforation or abscess without bleeding: Secondary | ICD-10-CM | POA: Diagnosis not present

## 2018-04-06 DIAGNOSIS — K219 Gastro-esophageal reflux disease without esophagitis: Secondary | ICD-10-CM

## 2018-04-06 MED ORDER — OMEPRAZOLE 20 MG PO CPDR: DELAYED_RELEASE_CAPSULE | ORAL | 11 refills | Status: DC

## 2018-04-06 NOTE — Progress Notes (Signed)
Subjective:    Patient ID: Tami Jacobson, female    DOB: 1927-04-11, 85 y.o.   MRN: [id number]  HPI Tami Jacobson is a pleasant 85 year old white female, established with Dr. Silverio Decamp.  She has history of chronic GERD, esophageal stricture with prior dilation, adult onset diabetes mellitus, hypertension, diverticulosis and history of breast cancer. She was last seen in the office in 2017. She has been maintained on PPI therapy for many years.  She says that she had read somewhere that it was not good to take PPIs long-term so she stopped taking her medicine sometime ago, but cannot recall exactly how many months ago. She started having recurrent heartburn and indigestion a few weeks ago which was persistent.  She also had some mild dysphasia at that time.  She called and was advised to go back on omeprazole 20 mg p.o. twice daily, and says her symptoms have since completely resolved. She says she is feeling pretty well currently has no heartburn or indigestion, no dysphagia, no abdominal pain.  Bowel movements are regular. She is not using any regular NSAIDs. Last EGD was done in 2009 with finding of a distal esophageal stricture which was Venia Minks dilated to #48, and colonoscopy in 2011- other than moderate diverticulosis.  Patient mentions today that she has been under a lot of chronic stress after what she calls an unnecessary divorce from her husband of 21 years which was final this past summer.  She is also acutely upset today as she has a very good friend in the intensive care unit.  Review of Systems Pertinent positive and negative review of systems were noted in the above HPI section.  All other review of systems was otherwise negative.  Outpatient Encounter Medications as of 04/06/2018  Medication Sig  . blood glucose meter kit and supplies KIT Dispense based on patient and insurance preference. Check sugars once daily and as needed as directed.  . clobetasol cream (TEMOVATE) 4.09 % 1  application as needed. Irritated skin  . cyanocobalamin (,VITAMIN B-12,) 1000 MCG/ML injection Inject 1,000 mcg into the muscle every 30 (thirty) days.   . flecainide (TAMBOCOR) 50 MG tablet Take 1 tablet (50 mg total) by mouth 2 (two) times daily. (Patient taking differently: Take 50 mg by mouth 2 (two) times daily. )  . glucose blood test strip accu check test strips DxE11.09--use to check blood sugars once daily  . metFORMIN (GLUCOPHAGE-XR) 500 MG 24 hr tablet TAKE 1 TABLET BY MOUTH ONCE DAILY WITH BREAKFAST  . metoprolol succinate (TOPROL-XL) 50 MG 24 hr tablet Take 1 tablet (50 mg total) by mouth daily.  . naproxen sodium (ANAPROX) 220 MG tablet Take 220 mg by mouth as needed.  Marland Kitchen omeprazole (PRILOSEC) 20 MG capsule Take 1 tablet by mouth before breakfast.  . tamoxifen (NOLVADEX) 20 MG tablet TAKE 1 TABLET BY MOUTH  DAILY  . [DISCONTINUED] omeprazole (PRILOSEC) 20 MG capsule Take 1 capsule (20 mg total) by mouth daily. (Patient taking differently: Take 20 mg by mouth 2 (two) times daily before a meal. )   No facility-administered encounter medications on file as of 04/06/2018.    Allergies  Allergen Reactions  . Statins Other (See Comments)    GI UPSET  . Effexor [Venlafaxine]     Constipation, shaking  . Cephalexin Other (See Comments)    UNKNOWN  . Codeine Other (See Comments)    UNKNOWN  . Lansoprazole Other (See Comments)    UNKNOWN  . Latex Other (See Comments)  UNKNOWN   . Levofloxacin Other (See Comments)    UNKNOWN  . Nitrofurantoin Other (See Comments)    UNKNOWN  . Rofecoxib Other (See Comments)    UNKNOWN    Patient Active Problem List   Diagnosis Date Noted  . Genetic testing 08/30/2017  . Adjustment disorder with mixed anxiety and depressed mood 06/01/2017  . Family history of breast cancer   . Family history of colon cancer   . Family history of pancreatic cancer   . Malignant neoplasm of lower-inner quadrant of right breast of female, estrogen receptor  positive ([place]) 10/28/2016  . Abnormal electrocardiogram 05/05/2015  . BPPV (benign paroxysmal positional vertigo) 10/01/2014  . Murmur 04/18/2014  . Anal fissure 12/16/2011  . Fibrocystic breast changes. Left.  Biopsy 07/02/2011. 07/14/2011  . History of breast cancer, Right, T1c, N0, lumpectomy 06/26/2008.   Left, T1c, N0, lumpectomy 06/24/2006. 05/12/2011  . Diabetes mellitus type 2, controlled ([place]) 06/11/2009  . PREMATURE VENTRICULAR CONTRACTIONS 08/19/2008  . B12 deficiency 08/29/2007  . ESOPHAGEAL STRICTURE 04/27/2007  . GERD (gastroesophageal reflux disease) 04/27/2007  . DIVERTICULOSIS, COLON 04/27/2007  . OTHER ALOPECIA 02/14/2007  . HYPERLIPIDEMIA 01/02/2007  . Essential hypertension 01/02/2007  . Carotid stenosis 08/22/2006   Social History   Socioeconomic History  . Marital status: Divorced    Spouse name: Marcello Moores  . Number of children: 1  . Years of education: Not on file  . Highest education level: Not on file  Occupational History  . Not on file  Social Needs  . Financial resource strain: Not hard at all  . Food insecurity:    Worry: Never true    Inability: Never true  . Transportation needs:    Medical: No    Non-medical: No  Tobacco Use  . Smoking status: Never Smoker  . Smokeless tobacco: Never Used  Substance and Sexual Activity  . Alcohol use: No    Alcohol/week: 0.0 standard drinks  . Drug use: No  . Sexual activity: Not Currently  Lifestyle  . Physical activity:    Days per week: 4 days    Minutes per session: 40 min  . Stress: Not at all  Relationships  . Social connections:    Talks on phone: More than three times a week    Gets together: More than three times a week    Attends religious service: More than 4 times per year    Active member of club or organization: Not on file    Attends meetings of clubs or organizations: 1 to 4 times per year    Relationship status: Divorced  . Intimate partner violence:    Fear of current or ex partner:  Not on file    Emotionally abused: Not on file    Physically abused: Not on file    Forced sexual activity: Not on file  Other Topics Concern  . Not on file  Social History Narrative   Retired Network engineer   Married, lives with spouse    Ms. Russon's family history includes Breast cancer (age of onset: 55) in an other family member; Breast cancer (age of onset: 40) in her sister; Breast cancer (age of onset: 63) in her sister; Breast cancer (age of onset: 22) in her sister; COPD in her brother; Colon cancer (age of onset: 63) in her mother; Heart attack in her mother; Heart disease in her brother, brother, maternal uncle, and sister; Other in her father; Pancreatic cancer in an other family member.  Objective:    Vitals:   04/06/18 1040  BP: 134/66  Pulse: 80    Physical Exam; well-developed elderly white female in no acute distress, pleasant blood pressure 134/66 pulse 80, BMI 23.7.  HEENT;nontraumatic normocephalic EOMI PERRLA sclera anicteric oral mucosa moist, Cardiovascular ;regular rate and rhythm with S1-S2 no murmur rub or gallop, Pulmonary; clear bilaterally, Abdomen; soft, nontender nondistended bowel sounds are active there is no palpable mass or hepatosplenomegaly.  Rectal ;exam not done, Extremities ;no clubbing cyanosis or edema skin warm dry, Neuropsych ;alert and oriented, grossly nonfocal mood and affect appropriate       Assessment & Plan:   #8 85 year old white female with chronic GERD and history of esophageal stricture requiring dilation in 2009. Patient had gone off of PPI therapy several months ago and developed recurrent assistant daily heartburn and indigestion. She was started back on omeprazole 20 mg p.o. twice daily a few weeks ago and symptoms have completely resolved. Patient had stopped medication due to concerns about long-term PPI use.  2 diverticulosis 3.  Hypertension 4.  Adult onset diabetes mellitus 5.  History of breast cancer  Plan; For   now patient is content back on omeprazole..  She understands that she has good indication to continue PPI therapy. We will continue omeprazole 20 mg p.o. twice daily for 2 more weeks then decrease to 20 mg p.o. every morning AC breakfast. Offered her a trial of Pepcid 40 mg p.o. every morning if she would like to try that at some point in the future.  For now she will stay on omeprazole and knows to call back in a couple of months if she wants to try a course of Pepcid. Patient will follow-up with Dr. Silverio Decamp or myself on an as-needed basis.  Reginal Wojcicki S Constantino Starace PA-C 04/06/2018   Cc: Binnie Rail, MD

## 2018-04-06 NOTE — Patient Instructions (Signed)
We sent a prescription  To [facility] for the Omeprazole 20 mg.   Take Prilosec 20 mg twice daily for 2 weeks then go to once daily.  Follow up with [place] as needed.  Normal BMI (Body Mass Index- based on height and weight) is between 23 and 30. Your BMI today is Body mass index is 23.74 kg/m. Marland Kitchen Please consider follow up  regarding your BMI with your Primary Care Provider.

## 2018-04-17 ENCOUNTER — Ambulatory Visit (INDEPENDENT_AMBULATORY_CARE_PROVIDER_SITE_OTHER): Payer: Medicare Other | Admitting: Emergency Medicine

## 2018-04-17 DIAGNOSIS — E538 Deficiency of other specified B group vitamins: Secondary | ICD-10-CM

## 2018-04-17 MED ORDER — CYANOCOBALAMIN 1000 MCG/ML IJ SOLN
1000.0000 ug | Freq: Once | INTRAMUSCULAR | Status: AC
  Administered 2018-04-17: 1000 ug via INTRAMUSCULAR

## 2018-04-17 NOTE — Progress Notes (Signed)
b12 Injection given.   Tami Pitera J Riese Hellard, MD  

## 2018-05-10 DIAGNOSIS — L570 Actinic keratosis: Secondary | ICD-10-CM | POA: Diagnosis not present

## 2018-05-10 DIAGNOSIS — L218 Other seborrheic dermatitis: Secondary | ICD-10-CM | POA: Diagnosis not present

## 2018-05-10 DIAGNOSIS — Z85828 Personal history of other malignant neoplasm of skin: Secondary | ICD-10-CM | POA: Diagnosis not present

## 2018-05-10 DIAGNOSIS — L438 Other lichen planus: Secondary | ICD-10-CM | POA: Diagnosis not present

## 2018-05-10 DIAGNOSIS — L821 Other seborrheic keratosis: Secondary | ICD-10-CM | POA: Diagnosis not present

## 2018-05-18 ENCOUNTER — Ambulatory Visit (INDEPENDENT_AMBULATORY_CARE_PROVIDER_SITE_OTHER): Payer: Medicare Other

## 2018-05-18 DIAGNOSIS — E538 Deficiency of other specified B group vitamins: Secondary | ICD-10-CM

## 2018-05-18 MED ORDER — CYANOCOBALAMIN 1000 MCG/ML IJ SOLN
1000.0000 ug | Freq: Once | INTRAMUSCULAR | Status: AC
  Administered 2018-05-18: 1000 ug via INTRAMUSCULAR

## 2018-05-18 NOTE — Progress Notes (Signed)
b12 Injection given.   Shana Younge J Kiondra Caicedo, MD  

## 2018-05-24 ENCOUNTER — Encounter: Payer: Self-pay | Admitting: Cardiology

## 2018-06-01 DIAGNOSIS — L821 Other seborrheic keratosis: Secondary | ICD-10-CM | POA: Diagnosis not present

## 2018-06-01 DIAGNOSIS — L570 Actinic keratosis: Secondary | ICD-10-CM | POA: Diagnosis not present

## 2018-06-01 DIAGNOSIS — L820 Inflamed seborrheic keratosis: Secondary | ICD-10-CM | POA: Diagnosis not present

## 2018-06-07 NOTE — Progress Notes (Signed)
    HPI: FU palpitations and cerebrovascular disease; history of PACs and PVCs with normal LV function. She had significant palpitations in the past despite beta-blockade. She is therefore on flecainide 50 mg p.o. b.i.d. A previous catheterization in June 2000 showed normal coronary arteries and normal LV function. Echo 1/16 showed normal LV function, mild LAE. Nuclear study February 2017 showed ejection fraction 72%and normal perfusion.Carotid dopplers 2/18 showed 1-39 bilateral stenosis. Since I last saw her,she has occasional dyspnea on exertion but no orthopnea, PND, pedal edema, chest pain or syncope.  Her palpitations are well controlled.  Current Outpatient Medications  Medication Sig Dispense Refill  . blood glucose meter kit and supplies KIT Dispense based on patient and insurance preference. Check sugars once daily and as needed as directed. 1 each 0  . clobetasol cream (TEMOVATE) 0.05 % 1 application as needed. Irritated skin    . cyanocobalamin (,VITAMIN B-12,) 1000 MCG/ML injection Inject 1,000 mcg into the muscle every 30 (thirty) days.     . flecainide (TAMBOCOR) 50 MG tablet Take 1 tablet (50 mg total) by mouth 2 (two) times daily. (Patient taking differently: Take 50 mg by mouth 2 (two) times daily. ) 180 tablet 3  . glucose blood test strip accu check test strips DxE11.09--use to check blood sugars once daily 100 each 12  . metFORMIN (GLUCOPHAGE-XR) 500 MG 24 hr tablet TAKE 1 TABLET BY MOUTH ONCE DAILY WITH BREAKFAST 90 tablet 1  . metoprolol succinate (TOPROL-XL) 50 MG 24 hr tablet Take 1 tablet (50 mg total) by mouth daily. 90 tablet 3  . naproxen sodium (ANAPROX) 220 MG tablet Take 220 mg by mouth as needed.    . omeprazole (PRILOSEC) 20 MG capsule Take 1 tablet by mouth before breakfast. 30 capsule 11  . tamoxifen (NOLVADEX) 20 MG tablet TAKE 1 TABLET BY MOUTH  DAILY 90 tablet 0   No current facility-administered medications for this visit.      Past Medical History:   Diagnosis Date  . Arthritis    left hip  . Breast cancer, right (HCC) 11/2016  . Family history of breast cancer   . Family history of colon cancer   . Family history of pancreatic cancer   . GERD (gastroesophageal reflux disease)   . Non-insulin dependent type 2 diabetes mellitus (HCC)   . PAC (premature atrial contraction)   . Personal history of radiation therapy 12/2016  . Premature ventricular contraction   . Seasonal allergies   . Sensitive skin   . Vitamin B12 deficiency     Past Surgical History:  Procedure Laterality Date  . ABDOMINAL HYSTERECTOMY  1979   partial  . BREAST BIOPSY  07/02/2011   Procedure: BREAST BIOPSY WITH NEEDLE LOCALIZATION;  Surgeon: David H Newman, MD;  Location: [facility];  Service: General;  Laterality: Left;  left breast atypical hyperplasia needle localization biopsy  . BREAST BIOPSY Left 06/13/2006  . BREAST BIOPSY Right 06/27/2008  . BREAST BIOPSY Right 09/27/2016  . BREAST EXCISIONAL BIOPSY Left 07/02/2011  . BREAST LUMPECTOMY Left 07/04/2006  . BREAST LUMPECTOMY Right 06/26/2008  . BREAST LUMPECTOMY Right 09/27/2016  . BREAST LUMPECTOMY WITH RADIOACTIVE SEED LOCALIZATION Right 11/18/2016   Procedure: RIGHT BREAST LUMPECTOMY WITH RADIOACTIVE SEED LOCALIZATION ERAS PATHWAY;  Surgeon: Newman, David, MD;  Location: [facility];  Service: General;  Laterality: Right;  ERAS PATHWAY  . CATARACT EXTRACTION Bilateral   . CHOLECYSTECTOMY, LAPAROSCOPIC  07/07/2001  . ESOPHAGEAL DILATION  2006  . LAPAROSCOPIC   APPENDECTOMY  07/07/2001  . TONSILLECTOMY  1942    Social History   Socioeconomic History  . Marital status: Divorced    Spouse name: Marcello Moores  . Number of children: 1  . Years of education: Not on file  . Highest education level: Not on file  Occupational History  . Not on file  Social Needs  . Financial resource strain: Not hard at all  . Food insecurity:    Worry: Never true    Inability: Never true  . Transportation  needs:    Medical: No    Non-medical: No  Tobacco Use  . Smoking status: Never Smoker  . Smokeless tobacco: Never Used  Substance and Sexual Activity  . Alcohol use: No    Alcohol/week: 0.0 standard drinks  . Drug use: No  . Sexual activity: Not Currently  Lifestyle  . Physical activity:    Days per week: 4 days    Minutes per session: 40 min  . Stress: Not at all  Relationships  . Social connections:    Talks on phone: More than three times a week    Gets together: More than three times a week    Attends religious service: More than 4 times per year    Active member of club or organization: Not on file    Attends meetings of clubs or organizations: 1 to 4 times per year    Relationship status: Divorced  . Intimate partner violence:    Fear of current or ex partner: Not on file    Emotionally abused: Not on file    Physically abused: Not on file    Forced sexual activity: Not on file  Other Topics Concern  . Not on file  Social History Narrative   Retired Network engineer   Married, lives with spouse    Family History  Problem Relation Age of Onset  . Colon cancer Mother 95  . Heart attack Mother   . Breast cancer Sister 75  . Heart disease Maternal Uncle        CABG  . Breast cancer Sister 62  . Heart disease Brother   . Other Father        suicide  . Breast cancer Sister 74  . Heart disease Sister   . COPD Brother   . Heart disease Brother   . Breast cancer Other 36       niece - brother's daughter  . Pancreatic cancer Other        brother's son, dx in his 68s    ROS: no fevers or chills, productive cough, hemoptysis, dysphasia, odynophagia, melena, hematochezia, dysuria, hematuria, rash, seizure activity, orthopnea, PND, pedal edema, claudication. Remaining systems are negative.  Physical Exam: Well-developed well-nourished in no acute distress.  Skin is warm and dry.  HEENT is normal.  Neck is supple.  Bilateral bruits Chest is clear to auscultation with  normal expansion.  Cardiovascular exam is regular rate and rhythm.  1/6 systolic murmur left sternal border. Abdominal exam nontender or distended. No masses palpated. Extremities show no edema. neuro grossly intact  ECG-normal sinus rhythm at a rate of 65, first-degree AV block, left axis deviation, no ST changes.  Personally reviewed  A/P  1 palpitations-this is felt secondary to PVCs.  Her symptoms are reasonably well controlled.  Continue flecainide and beta-blocker at present dose.  2 hypertension-patient's blood pressure is controlled.  Continue present medications and follow.  3 carotid artery disease-mild on most recent Dopplers.  Kirk Ruths,  MD

## 2018-06-13 ENCOUNTER — Encounter: Payer: Self-pay | Admitting: Cardiology

## 2018-06-13 ENCOUNTER — Ambulatory Visit: Payer: Medicare Other | Admitting: Cardiology

## 2018-06-13 VITALS — BP 132/54 | HR 65 | Ht 63.0 in | Wt 134.8 lb

## 2018-06-13 DIAGNOSIS — R002 Palpitations: Secondary | ICD-10-CM | POA: Diagnosis not present

## 2018-06-13 DIAGNOSIS — I6529 Occlusion and stenosis of unspecified carotid artery: Secondary | ICD-10-CM

## 2018-06-13 DIAGNOSIS — I10 Essential (primary) hypertension: Secondary | ICD-10-CM | POA: Diagnosis not present

## 2018-06-13 NOTE — Patient Instructions (Signed)

## 2018-06-19 ENCOUNTER — Ambulatory Visit (INDEPENDENT_AMBULATORY_CARE_PROVIDER_SITE_OTHER): Payer: Medicare Other | Admitting: *Deleted

## 2018-06-19 ENCOUNTER — Other Ambulatory Visit: Payer: Self-pay

## 2018-06-19 DIAGNOSIS — E538 Deficiency of other specified B group vitamins: Secondary | ICD-10-CM

## 2018-06-19 MED ORDER — CYANOCOBALAMIN 1000 MCG/ML IJ SOLN
1000.0000 ug | Freq: Once | INTRAMUSCULAR | Status: AC
  Administered 2018-06-19: 1000 ug via INTRAMUSCULAR

## 2018-06-20 NOTE — Progress Notes (Signed)
b12 Injection given.   Tami Jacobson J Anchor Dwan, MD  

## 2018-06-20 NOTE — Progress Notes (Addendum)
Pls co-sign for b12..Tami Jacobson   b12 Injection given.   Binnie Rail, MD

## 2018-06-26 ENCOUNTER — Ambulatory Visit: Payer: Medicare Other | Admitting: Internal Medicine

## 2018-07-17 ENCOUNTER — Ambulatory Visit: Payer: Medicare Other | Admitting: Oncology

## 2018-07-17 ENCOUNTER — Other Ambulatory Visit: Payer: Medicare Other

## 2018-07-18 ENCOUNTER — Other Ambulatory Visit: Payer: Self-pay | Admitting: Oncology

## 2018-07-18 DIAGNOSIS — Z853 Personal history of malignant neoplasm of breast: Secondary | ICD-10-CM

## 2018-07-20 ENCOUNTER — Ambulatory Visit: Payer: Medicare Other

## 2018-07-21 ENCOUNTER — Ambulatory Visit: Payer: Medicare Other | Admitting: Internal Medicine

## 2018-08-02 ENCOUNTER — Other Ambulatory Visit: Payer: Self-pay | Admitting: Cardiology

## 2018-08-02 ENCOUNTER — Other Ambulatory Visit: Payer: Self-pay | Admitting: Oncology

## 2018-08-02 DIAGNOSIS — Z170 Estrogen receptor positive status [ER+]: Principal | ICD-10-CM

## 2018-08-02 DIAGNOSIS — C50311 Malignant neoplasm of lower-inner quadrant of right female breast: Secondary | ICD-10-CM

## 2018-08-02 NOTE — Telephone Encounter (Signed)
Metoprolol succ and flecainide refilled.

## 2018-08-21 ENCOUNTER — Inpatient Hospital Stay: Payer: Medicare Other | Admitting: Oncology

## 2018-08-21 ENCOUNTER — Inpatient Hospital Stay: Payer: Medicare Other

## 2018-08-22 NOTE — Progress Notes (Signed)
Subjective:    Patient ID: Tami Jacobson, female    DOB: 01-02-1928, 85 y.o.   MRN: [id number]  HPI The patient is here for follow up.  She is exercising - walking some.Marland Kitchen     Hypertension: She is taking her medication daily. She is compliant with a low sodium diet.  She denies chest pain, palpitations, edema, shortness of breath and regular headaches. She does not monitor her blood pressure at home.    Hyperlipidemia:  She is intolerant of statins.  She does not always eat healthy.  He does not cook much and sometimes is eating fast food.  Diabetes: She is taking her medication daily as prescribed. She is compliant with a diabetic diet.     GERD:  She is taking her medication daily as prescribed.  She denies any GERD symptoms and feels her GERD is well controlled.   Anxiety, Depression: She is not on any medication.  She does feel a little depressed at times.  She lives alone and gets lonely.  She was on an antidepressant in the past, but did not want to take it and at this point she does not want to try it again or try different medication.  She denies any significant anxiety.  B12 def:  She was getting B12 injections monthly.  Since she has not been able to get her B12 injection she has been taking a MVI.  She does want her B12 injection.      Medications and allergies reviewed with patient and updated if appropriate.  Patient Active Problem List   Diagnosis Date Noted  . Genetic testing 08/30/2017  . Adjustment disorder with mixed anxiety and depressed mood 06/01/2017  . Family history of breast cancer   . Family history of colon cancer   . Family history of pancreatic cancer   . Malignant neoplasm of lower-inner quadrant of right breast of female, estrogen receptor positive ([place]) 10/28/2016  . Abnormal electrocardiogram 05/05/2015  . BPPV (benign paroxysmal positional vertigo) 10/01/2014  . Murmur 04/18/2014  . Anal fissure 12/16/2011  . Fibrocystic breast changes.  Left.  Biopsy 07/02/2011. 07/14/2011  . History of breast cancer, Right, T1c, N0, lumpectomy 06/26/2008.   Left, T1c, N0, lumpectomy 06/24/2006. 05/12/2011  . Diabetes mellitus type 2, controlled ([place]) 06/11/2009  . PREMATURE VENTRICULAR CONTRACTIONS 08/19/2008  . B12 deficiency 08/29/2007  . ESOPHAGEAL STRICTURE 04/27/2007  . GERD (gastroesophageal reflux disease) 04/27/2007  . DIVERTICULOSIS, COLON 04/27/2007  . OTHER ALOPECIA 02/14/2007  . HYPERLIPIDEMIA 01/02/2007  . Essential hypertension 01/02/2007  . Carotid stenosis 08/22/2006    Current Outpatient Medications on File Prior to Visit  Medication Sig Dispense Refill  . blood glucose meter kit and supplies KIT Dispense based on patient and insurance preference. Check sugars once daily and as needed as directed. 1 each 0  . clobetasol cream (TEMOVATE) 5.05 % 1 application as needed. Irritated skin    . cyanocobalamin (,VITAMIN B-12,) 1000 MCG/ML injection Inject 1,000 mcg into the muscle every 30 (thirty) days.     . flecainide (TAMBOCOR) 50 MG tablet TAKE 1 TABLET BY MOUTH TWO  TIMES DAILY 180 tablet 3  . glucose blood test strip accu check test strips DxE11.09--use to check blood sugars once daily 100 each 12  . metFORMIN (GLUCOPHAGE-XR) 500 MG 24 hr tablet TAKE 1 TABLET BY MOUTH ONCE DAILY WITH BREAKFAST 90 tablet 1  . metoprolol succinate (TOPROL-XL) 50 MG 24 hr tablet TAKE 1 TABLET BY MOUTH  DAILY 90  tablet 3  . naproxen sodium (ANAPROX) 220 MG tablet Take 220 mg by mouth as needed.    Marland Kitchen omeprazole (PRILOSEC) 20 MG capsule Take 1 tablet by mouth before breakfast. 30 capsule 11  . tamoxifen (NOLVADEX) 20 MG tablet TAKE 1 TABLET BY MOUTH  DAILY 90 tablet 0   No current facility-administered medications on file prior to visit.     Past Medical History:  Diagnosis Date  . Arthritis    left hip  . Breast cancer, right ([place]) 11/2016  . Family history of breast cancer   . Family history of colon cancer   . Family history of  pancreatic cancer   . GERD (gastroesophageal reflux disease)   . Non-insulin dependent type 2 diabetes mellitus ([place])   . PAC (premature atrial contraction)   . Personal history of radiation therapy 12/2016  . Premature ventricular contraction   . Seasonal allergies   . Sensitive skin   . Vitamin B12 deficiency     Past Surgical History:  Procedure Laterality Date  . ABDOMINAL HYSTERECTOMY  1979   partial  . BREAST BIOPSY  07/02/2011   Procedure: BREAST BIOPSY WITH NEEDLE LOCALIZATION;  Surgeon: Shann Medal, MD;  Location: [facility];  Service: General;  Laterality: Left;  left breast atypical hyperplasia needle localization biopsy  . BREAST BIOPSY Left 06/13/2006  . BREAST BIOPSY Right 06/27/2008  . BREAST BIOPSY Right 09/27/2016  . BREAST EXCISIONAL BIOPSY Left 07/02/2011  . BREAST LUMPECTOMY Left 07/04/2006  . BREAST LUMPECTOMY Right 06/26/2008  . BREAST LUMPECTOMY Right 09/27/2016  . BREAST LUMPECTOMY WITH RADIOACTIVE SEED LOCALIZATION Right 11/18/2016   Procedure: RIGHT BREAST LUMPECTOMY WITH RADIOACTIVE SEED LOCALIZATION ERAS PATHWAY;  Surgeon: Alphonsa Overall, MD;  Location: [facility];  Service: General;  Laterality: Right;  ERAS PATHWAY  . CATARACT EXTRACTION Bilateral   . CHOLECYSTECTOMY, LAPAROSCOPIC  07/07/2001  . ESOPHAGEAL DILATION  2006  . LAPAROSCOPIC APPENDECTOMY  07/07/2001  . TONSILLECTOMY  1942    Social History   Socioeconomic History  . Marital status: Divorced    Spouse name: Marcello Moores  . Number of children: 1  . Years of education: Not on file  . Highest education level: Not on file  Occupational History  . Not on file  Social Needs  . Financial resource strain: Not hard at all  . Food insecurity:    Worry: Never true    Inability: Never true  . Transportation needs:    Medical: No    Non-medical: No  Tobacco Use  . Smoking status: Never Smoker  . Smokeless tobacco: Never Used  Substance and Sexual Activity  . Alcohol use: No     Alcohol/week: 0.0 standard drinks  . Drug use: No  . Sexual activity: Not Currently  Lifestyle  . Physical activity:    Days per week: 4 days    Minutes per session: 40 min  . Stress: Not at all  Relationships  . Social connections:    Talks on phone: More than three times a week    Gets together: More than three times a week    Attends religious service: More than 4 times per year    Active member of club or organization: Not on file    Attends meetings of clubs or organizations: 1 to 4 times per year    Relationship status: Divorced  Other Topics Concern  . Not on file  Social History Narrative   Retired Network engineer   Married, lives with spouse  Family History  Problem Relation Age of Onset  . Colon cancer Mother 61  . Heart attack Mother   . Breast cancer Sister 72  . Heart disease Maternal Uncle        CABG  . Breast cancer Sister 17  . Heart disease Brother   . Other Father        suicide  . Breast cancer Sister 100  . Heart disease Sister   . COPD Brother   . Heart disease Brother   . Breast cancer Other 15       niece - brother's daughter  . Pancreatic cancer Other        brother's son, dx in his 75s    Review of Systems  Constitutional: Negative for chills and fever.  HENT: Positive for rhinorrhea.   Eyes: Positive for discharge (eye watering).  Respiratory: Negative for cough, shortness of breath and wheezing.   Cardiovascular: Negative for chest pain, palpitations and leg swelling.  Gastrointestinal: Negative for abdominal pain and nausea.       No gerd - controlled  Neurological: Negative for light-headedness and headaches.       Objective:   Vitals:   08/23/18 1050  BP: (!) 144/80  Pulse: 68  Resp: 16  Temp: 98.5 F (36.9 C)  SpO2: 98%   BP Readings from Last 3 Encounters:  08/23/18 (!) 144/80  06/13/18 (!) 132/54  04/06/18 134/66   Wt Readings from Last 3 Encounters:  08/23/18 133 lb 12.8 oz (60.7 kg)  06/13/18 134 lb 12.8 oz (61.1  kg)  04/06/18 134 lb (60.8 kg)   Body mass index is 23.7 kg/m.   Physical Exam    Constitutional: Appears well-developed and well-nourished. No distress.  HENT:  Head: Normocephalic and atraumatic.  Neck: Neck supple. No tracheal deviation present. No thyromegaly present.  No cervical lymphadenopathy Cardiovascular: Normal rate, regular rhythm and normal heart sounds.   3/6 sys murmur heard. No carotid bruit .  No edema Pulmonary/Chest: Effort normal and breath sounds normal. No respiratory distress. No has no wheezes. No rales.  Skin: Skin is warm and dry. Not diaphoretic.  Psychiatric: Normal mood and affect. Behavior is normal.      Assessment & Plan:    See Problem List for Assessment and Plan of chronic medical problems.

## 2018-08-22 NOTE — Patient Instructions (Addendum)
  Tests ordered today. Your results will be released to [place] (or called to you) after review, usually within 72hours after test completion. If any changes need to be made, you will be notified at that same time.   B12  administered today.   Medications reviewed and updated.  Changes include :   none   Please followup in 6 months

## 2018-08-23 ENCOUNTER — Ambulatory Visit (INDEPENDENT_AMBULATORY_CARE_PROVIDER_SITE_OTHER): Payer: Medicare Other | Admitting: Internal Medicine

## 2018-08-23 ENCOUNTER — Encounter: Payer: Self-pay | Admitting: Internal Medicine

## 2018-08-23 ENCOUNTER — Other Ambulatory Visit (INDEPENDENT_AMBULATORY_CARE_PROVIDER_SITE_OTHER): Payer: Medicare Other

## 2018-08-23 ENCOUNTER — Other Ambulatory Visit: Payer: Self-pay

## 2018-08-23 VITALS — BP 144/80 | HR 68 | Temp 98.5°F | Resp 16 | Ht 63.0 in | Wt 133.8 lb

## 2018-08-23 DIAGNOSIS — I10 Essential (primary) hypertension: Secondary | ICD-10-CM

## 2018-08-23 DIAGNOSIS — E538 Deficiency of other specified B group vitamins: Secondary | ICD-10-CM

## 2018-08-23 DIAGNOSIS — E119 Type 2 diabetes mellitus without complications: Secondary | ICD-10-CM

## 2018-08-23 DIAGNOSIS — K219 Gastro-esophageal reflux disease without esophagitis: Secondary | ICD-10-CM | POA: Diagnosis not present

## 2018-08-23 DIAGNOSIS — E782 Mixed hyperlipidemia: Secondary | ICD-10-CM | POA: Diagnosis not present

## 2018-08-23 DIAGNOSIS — F4323 Adjustment disorder with mixed anxiety and depressed mood: Secondary | ICD-10-CM

## 2018-08-23 LAB — CBC WITH DIFFERENTIAL/PLATELET
Basophils Absolute: 0 10*3/uL (ref 0.0–0.1)
Basophils Relative: 0.5 % (ref 0.0–3.0)
Eosinophils Absolute: 0.1 10*3/uL (ref 0.0–0.7)
Eosinophils Relative: 1.3 % (ref 0.0–5.0)
HCT: 34.8 % — ABNORMAL LOW (ref 36.0–46.0)
Hemoglobin: 12.2 g/dL (ref 12.0–15.0)
Lymphocytes Relative: 17.2 % (ref 12.0–46.0)
Lymphs Abs: 1 10*3/uL (ref 0.7–4.0)
MCHC: 35.1 g/dL (ref 30.0–36.0)
MCV: 93.4 fl (ref 78.0–100.0)
Monocytes Absolute: 0.5 10*3/uL (ref 0.1–1.0)
Monocytes Relative: 8 % (ref 3.0–12.0)
Neutro Abs: 4.3 10*3/uL (ref 1.4–7.7)
Neutrophils Relative %: 73 % (ref 43.0–77.0)
Platelets: 144 10*3/uL — ABNORMAL LOW (ref 150.0–400.0)
RBC: 3.72 Mil/uL — ABNORMAL LOW (ref 3.87–5.11)
RDW: 14.4 % (ref 11.5–15.5)
WBC: 5.9 10*3/uL (ref 4.0–10.5)

## 2018-08-23 LAB — COMPREHENSIVE METABOLIC PANEL
ALT: 13 U/L (ref 0–35)
AST: 15 U/L (ref 0–37)
Albumin: 4 g/dL (ref 3.5–5.2)
Alkaline Phosphatase: 35 U/L — ABNORMAL LOW (ref 39–117)
BUN: 12 mg/dL (ref 6–23)
CO2: 29 mEq/L (ref 19–32)
Calcium: 9.6 mg/dL (ref 8.4–10.5)
Chloride: 103 mEq/L (ref 96–112)
Creatinine, Ser: 1.17 mg/dL (ref 0.40–1.20)
GFR: 43.57 mL/min — ABNORMAL LOW (ref >60.00)
Glucose, Bld: 172 mg/dL — ABNORMAL HIGH (ref 70–99)
Potassium: 4.6 mEq/L (ref 3.5–5.1)
Sodium: 139 mEq/L (ref 135–145)
Total Bilirubin: 0.5 mg/dL (ref 0.2–1.2)
Total Protein: 6.8 g/dL (ref 6.0–8.3)

## 2018-08-23 LAB — LIPID PANEL
Cholesterol: 182 mg/dL (ref 0–200)
HDL: 53.5 mg/dL (ref >39.00)
LDL Cholesterol: 89 mg/dL (ref 0–99)
NonHDL: 128.1
Total CHOL/HDL Ratio: 3
Triglycerides: 194 mg/dL — ABNORMAL HIGH (ref 0.0–149.0)
VLDL: 38.8 mg/dL (ref 0.0–40.0)

## 2018-08-23 LAB — MICROALBUMIN / CREATININE URINE RATIO
Creatinine,U: 52.8 mg/dL
Microalb Creat Ratio: 1.9 mg/g (ref 0.0–30.0)
Microalb, Ur: 1 mg/dL (ref 0.0–1.9)

## 2018-08-23 LAB — HEMOGLOBIN A1C: Hgb A1c MFr Bld: 7.1 % — ABNORMAL HIGH (ref 4.6–6.5)

## 2018-08-23 LAB — TSH: TSH: 1.9 u[IU]/mL (ref 0.35–4.50)

## 2018-08-23 MED ORDER — MULTIVITAMINS PO CAPS: 1.0000 | ORAL_CAPSULE | Freq: Every day | ORAL | Status: DC

## 2018-08-23 MED ORDER — CYANOCOBALAMIN 1000 MCG/ML IJ SOLN
1000.0000 ug | Freq: Once | INTRAMUSCULAR | Status: AC
  Administered 2018-08-23: 1000 ug via INTRAMUSCULAR

## 2018-08-23 NOTE — Assessment & Plan Note (Signed)
Monthly B12 injections Injection today

## 2018-08-23 NOTE — Assessment & Plan Note (Signed)
Has not tolerated statins We will check lipid panel, CMP, TSH Encouraged her to continue exercise-more regular exercise ideal She does eat takeout at times and encouraged her to adopt a more healthy diet

## 2018-08-23 NOTE — Assessment & Plan Note (Signed)
BP Readings from Last 3 Encounters:  08/23/18 (!) 144/80  06/13/18 (!) 132/54  04/06/18 134/66    BP controlled Current regimen effective and well tolerated Continue current medications at current doses CMP

## 2018-08-23 NOTE — Assessment & Plan Note (Signed)
Lab Results  Component Value Date   HGBA1C 6.6 (H) 12/27/2017   Sugars have been controlled Continue metformin Recheck A1c, urine microalbumin Continue regular exercise Work on improving diet

## 2018-08-23 NOTE — Assessment & Plan Note (Signed)
Denies anxiety, but admits to some depression Her depression is not at the point where she wants to try medication again She does try to keep busy, which helps she does live alone and does feel lonely at times Advised for her to let me know at any point time if she wants to try medication for the depression

## 2018-08-23 NOTE — Assessment & Plan Note (Signed)
GERD controlled Continue daily medication  

## 2018-09-06 ENCOUNTER — Ambulatory Visit
Admission: RE | Admit: 2018-09-06 | Discharge: 2018-09-06 | Disposition: A | Discharge status: Home / Self Care | Payer: Medicare Other | Source: Ambulatory Visit | Attending: Oncology | Admitting: Oncology

## 2018-09-06 ENCOUNTER — Other Ambulatory Visit: Payer: Self-pay

## 2018-09-06 DIAGNOSIS — R922 Inconclusive mammogram: Secondary | ICD-10-CM | POA: Diagnosis not present

## 2018-09-06 DIAGNOSIS — Z853 Personal history of malignant neoplasm of breast: Secondary | ICD-10-CM | POA: Diagnosis not present

## 2018-09-15 ENCOUNTER — Other Ambulatory Visit: Payer: Self-pay | Admitting: Oncology

## 2018-09-18 ENCOUNTER — Telehealth: Payer: Self-pay | Admitting: Oncology

## 2018-09-18 NOTE — Telephone Encounter (Signed)
Called patient regarding upcoming [facility] appointment, patient would prefer this to be a telephone visit.

## 2018-09-19 ENCOUNTER — Telehealth: Payer: Self-pay | Admitting: Oncology

## 2018-09-19 NOTE — Progress Notes (Signed)
[place]  [phone] [phone]     ID: NYASIAH MOFFET DOB: [birth year]  MR#: [id number]  BPZ#:[id number]  Patient Care Team: Binnie Rail, MD as PCP - General (Internal Medicine) Stanford Breed Denice Bors, MD as Consulting Physician (Cardiology) Magrinat, Virgie Dad, MD as Consulting Physician (Hematology and Oncology) Newton Pigg, MD as Consulting Physician (Obstetrics and Gynecology) Alphonsa Overall, MD as Consulting Physician (General Surgery) Rozetta Nunnery, MD as Consulting Physician (Otolaryngology) Kyung Rudd, MD as Consulting Physician (Radiation Oncology) Delice Bison, Charlestine Massed, NP as Nurse Practitioner (Hematology and Oncology) OTHER MD:   CHIEF COMPLAINT: Estrogen receptor positive breast cancer  CURRENT TREATMENT:  tamoxifen   I connected with Tami Jacobson on 09/20/18 at  1:30 PM EDT by telephone visit and verified that I am speaking with the correct person using two identifiers.   I discussed the limitations, risks, security and privacy concerns of performing an evaluation and management service by telemedicine and the availability of in-person appointments. I also discussed with the patient that there may be a patient responsible charge related to this service. The patient expressed understanding and agreed to proceed.   Other persons participating in the visit and their role in the encounter:   - Biomedical scientist, Medical Scribe   Patient's location: Home  Provider's location: [facility]   Chief Complaint: Estrogen receptor positive breast cancer    BREAST CANCER HISTORY: From the recent update summary:  The patient had bilateral screening mammography with tomography at the [facility] 06/28/2016 showing only post surgical changes in both breasts. The patient had a history of right lateral breast cancer diagnosed in 2010, T1 cN0, status post lumpectomy March of that year, and she also has a history of  T1 CN 0 left breast cancer status post lumpectomy in 2008. She had excisional biopsy on the left of what proved to be atypical lobular hyperplasia March 2013.  On 09/23/2016 the patient was evaluated for pain in the inferior right breast and a feeling of lumpiness. Right [facility] found the breast density to be category C. In the right breast there was skin thickening around the areola. This appeared unchanged. On tomography however there was a suggestion of a small spiculated mass in the inferior central right breast measuring 0.5 cm. This was palpable at the 5:00 position 2 cm from the nipple. Targeted ultrasonography confirmed an irregular mass in this position measuring 0.8 cm. Ultrasound of the right axilla was negative.  Biopsy of this right breast lower inner quadrant mass 09/27/2016 showed ([id number]) invasive lobular carcinoma, E-cadherin negative, grade not stated, estrogen receptor 80% positive, progesterone receptor 5% positive, both with strong staining intensity, with an MIB-1 of 5%, and no HER-2 amplification, the signals ratio being 1.21 and the number per cell 2.30.  The patient's subsequent history is as detailed below.   INTERVAL HISTORY: Tami Jacobson was seen today for follow-up and treatment of her estrogen receptor positive breast cancer.   She continues on tamoxifen. She is not having any issues with hot flashes or vaginal discharge.   Since her last visit here, she underwent a digital diagnostic bilateral mammogram with tomography on 09/06/2018 showing: Breast Density Category C. There is no mammographic evidence of malignancy.    REVIEW OF SYSTEMS: Tami Jacobson states that she is doing well. She says that it is hard to stay in as much as she needs to amid the COVID-19 virus, but she does have  to go grocery shopping. She has not been able to get much exercise inside unfortunately. She notes that she is having issues with dry skin.  The patient denies unusual headaches, visual changes, nausea, vomiting, or dizziness. There has been no unusual cough, phlegm production, or pleurisy. This been no change in bowel or bladder habits. The patient denies unexplained fatigue or unexplained weight loss, bleeding, rash, or fever. A detailed review of systems was otherwise noncontributory.    PAST MEDICAL HISTORY: Past Medical History:  Diagnosis Date  . Arthritis    left hip  . Breast cancer, right ([place]) 11/2016  . Family history of breast cancer   . Family history of colon cancer   . Family history of pancreatic cancer   . GERD (gastroesophageal reflux disease)   . Non-insulin dependent type 2 diabetes mellitus ([place])   . PAC (premature atrial contraction)   . Personal history of radiation therapy 12/2016  . Premature ventricular contraction   . Seasonal allergies   . Sensitive skin   . Vitamin B12 deficiency     PAST SURGICAL HISTORY: Past Surgical History:  Procedure Laterality Date  . ABDOMINAL HYSTERECTOMY  1979   partial  . BREAST BIOPSY  07/02/2011   Procedure: BREAST BIOPSY WITH NEEDLE LOCALIZATION;  Surgeon: Shann Medal, MD;  Location: [facility];  Service: General;  Laterality: Left;  left breast atypical hyperplasia needle localization biopsy  . BREAST BIOPSY Left 06/13/2006  . BREAST BIOPSY Right 06/27/2008  . BREAST BIOPSY Right 09/27/2016  . BREAST EXCISIONAL BIOPSY Left 07/02/2011  . BREAST LUMPECTOMY Left 07/04/2006  . BREAST LUMPECTOMY Right 06/26/2008  . BREAST LUMPECTOMY Right 09/27/2016  . BREAST LUMPECTOMY WITH RADIOACTIVE SEED LOCALIZATION Right 11/18/2016   Procedure: RIGHT BREAST LUMPECTOMY WITH RADIOACTIVE SEED LOCALIZATION ERAS PATHWAY;  Surgeon: Alphonsa Overall, MD;  Location: [facility];  Service: General;  Laterality: Right;  ERAS PATHWAY  . CATARACT EXTRACTION Bilateral   . CHOLECYSTECTOMY, LAPAROSCOPIC  07/07/2001  . ESOPHAGEAL DILATION  2006  . LAPAROSCOPIC APPENDECTOMY   07/07/2001  . TONSILLECTOMY  1942    FAMILY HISTORY Family History  Problem Relation Age of Onset  . Colon cancer Mother 15  . Heart attack Mother   . Breast cancer Sister 37  . Heart disease Maternal Uncle        CABG  . Breast cancer Sister 77  . Heart disease Brother   . Other Father        suicide  . Breast cancer Sister 79  . Heart disease Sister   . COPD Brother   . Heart disease Brother   . Breast cancer Other 39       niece - brother's daughter  . Pancreatic cancer Other        brother's son, dx in his 55s  The patient's father committed suicide at the age of 58. The patient's mother had a history of colon cancer diagnosed at age 61, she died at the age of 110 from unrelated causes. The patient is 46 sisters. 2 sisters died from breast cancer, 1 diagnosed age 92 diagnosed age 73. A third sister was diagnosed with breast cancer at age 25. The patient has 2 brothers. There is no other history of cancer in the family to the patient's knowledge.  GYNECOLOGIC HISTORY:  GX P1, first live birth age 32. The patient took Premarin alone for nearly 30 years, status post hysterectomy. No LMP recorded. Patient has had a hysterectomy.   SOCIAL HISTORY:  She worked as a Network engineer for [employer]. Her husband of more than 78 year, Marcello Moores, "just left me and moved to an assisted living facility.". Their divorce was finalized August 2018. The patient enters me that her daughter Margarita Grizzle is estranged. The patient lives by herself, with no pets. [place]   ADVANCED DIRECTIVES: Not in place. At the 10/28/2016 visit the patient was given the appropriate documents to complete and notarize at her discretion.   HEALTH MAINTENANCE: Social History   Tobacco Use  . Smoking status: Never Smoker  . Smokeless tobacco: Never Used  Substance Use Topics  . Alcohol use: No    Alcohol/week: 0.0 standard drinks  . Drug use: No     Colonoscopy:  PAP:   Bone density:   Allergies  Allergen Reactions  . Statins Other (See Comments)    GI UPSET  . Effexor [Venlafaxine]     Constipation, shaking  . Cephalexin Other (See Comments)    UNKNOWN  . Codeine Other (See Comments)    UNKNOWN  . Lansoprazole Other (See Comments)    UNKNOWN  . Latex Other (See Comments)    UNKNOWN   . Levofloxacin Other (See Comments)    UNKNOWN  . Nitrofurantoin Other (See Comments)    UNKNOWN  . Rofecoxib Other (See Comments)    UNKNOWN     Current Outpatient Medications  Medication Sig Dispense Refill  . blood glucose meter kit and supplies KIT Dispense based on patient and insurance preference. Check sugars once daily and as needed as directed. 1 each 0  . clobetasol cream (TEMOVATE) 5.05 % 1 application as needed. Irritated skin    . cyanocobalamin (,VITAMIN B-12,) 1000 MCG/ML injection Inject 1,000 mcg into the muscle every 30 (thirty) days.     . flecainide (TAMBOCOR) 50 MG tablet TAKE 1 TABLET BY MOUTH TWO  TIMES DAILY 180 tablet 3  . glucose blood test strip accu check test strips DxE11.09--use to check blood sugars once daily 100 each 12  . metFORMIN (GLUCOPHAGE-XR) 500 MG 24 hr tablet TAKE 1 TABLET BY MOUTH ONCE DAILY WITH BREAKFAST 90 tablet 1  . metoprolol succinate (TOPROL-XL) 50 MG 24 hr tablet TAKE 1 TABLET BY MOUTH  DAILY 90 tablet 3  . Multiple Vitamin (MULTIVITAMIN) capsule Take 1 capsule by mouth daily.    . naproxen sodium (ANAPROX) 220 MG tablet Take 220 mg by mouth as needed.    Marland Kitchen omeprazole (PRILOSEC) 20 MG capsule Take 1 tablet by mouth before breakfast. 30 capsule 11  . tamoxifen (NOLVADEX) 20 MG tablet TAKE 1 TABLET BY MOUTH  DAILY 90 tablet 0   No current facility-administered medications for this visit.     OBJECTIVE: Older white woman in no acute distress  There were no vitals filed for this visit.   There is no height or weight on file to calculate BMI.   Wt Readings from Last 3 Encounters:  08/23/18 133 lb 12.8 oz  (60.7 kg)  06/13/18 134 lb 12.8 oz (61.1 kg)  04/06/18 134 lb (60.8 kg)      ECOG FS:1 - Symptomatic but completely ambulatory    LAB RESULTS:  CMP     Component Value Date/Time   NA 139 08/23/2018 1151   NA 136 02/28/2017 1453   K 4.6 08/23/2018 1151   K 5.1 02/28/2017 1453   CL 103 08/23/2018 1151   CL 106 07/11/2012 0901   CO2 29 08/23/2018 1151  CO2 26 02/28/2017 1453   GLUCOSE 172 (H) 08/23/2018 1151   GLUCOSE 179 (H) 02/28/2017 1453   GLUCOSE 110 (H) 07/11/2012 0901   BUN 12 08/23/2018 1151   BUN 18.2 02/28/2017 1453   CREATININE 1.17 08/23/2018 1151   CREATININE 1.1 02/28/2017 1453   CALCIUM 9.6 08/23/2018 1151   CALCIUM 10.1 02/28/2017 1453   PROT 6.8 08/23/2018 1151   PROT 7.6 02/28/2017 1453   ALBUMIN 4.0 08/23/2018 1151   ALBUMIN 3.9 02/28/2017 1453   AST 15 08/23/2018 1151   AST 12 02/28/2017 1453   ALT 13 08/23/2018 1151   ALT 10 02/28/2017 1453   ALKPHOS 35 (L) 08/23/2018 1151   ALKPHOS 84 02/28/2017 1453   BILITOT 0.5 08/23/2018 1151   BILITOT 0.70 02/28/2017 1453   GFRNONAA 35 (L) 02/13/2018 1054   GFRAA 41 (L) 02/13/2018 1054    No results found for: Ronnald Ramp, A1GS, A2GS, BETS, BETA2SER, GAMS, MSPIKE, SPEI  No results found for: Nils Pyle, [facility]  Lab Results  Component Value Date   WBC 5.9 08/23/2018   NEUTROABS 4.3 08/23/2018   HGB 12.2 08/23/2018   HCT 34.8 (L) 08/23/2018   MCV 93.4 08/23/2018   PLT 144.0 (L) 08/23/2018      Chemistry      Component Value Date/Time   NA 139 08/23/2018 1151   NA 136 02/28/2017 1453   K 4.6 08/23/2018 1151   K 5.1 02/28/2017 1453   CL 103 08/23/2018 1151   CL 106 07/11/2012 0901   CO2 29 08/23/2018 1151   CO2 26 02/28/2017 1453   BUN 12 08/23/2018 1151   BUN 18.2 02/28/2017 1453   CREATININE 1.17 08/23/2018 1151   CREATININE 1.1 02/28/2017 1453      Component Value Date/Time   CALCIUM 9.6 08/23/2018 1151   CALCIUM 10.1 02/28/2017 1453   ALKPHOS 35  (L) 08/23/2018 1151   ALKPHOS 84 02/28/2017 1453   AST 15 08/23/2018 1151   AST 12 02/28/2017 1453   ALT 13 08/23/2018 1151   ALT 10 02/28/2017 1453   BILITOT 0.5 08/23/2018 1151   BILITOT 0.70 02/28/2017 1453       Lab Results  Component Value Date   LABCA2 18 10/07/2009    No components found for: [id number]  No results for input(s): INR in the last 168 hours.  Urinalysis No results found for: COLORURINE, APPEARANCEUR, LABSPEC, PHURINE, GLUCOSEU, HGBUR, BILIRUBINUR, KETONESUR, PROTEINUR, UROBILINOGEN, NITRITE, LEUKOCYTESUR   STUDIES: Mm Diag Breast Tomo Bilateral  Result Date: 09/06/2018 CLINICAL DATA:  85 year old female for annual follow-up.History of RIGHT breast cancer and lumpectomies in 2010 and 2018. History of LEFT breast cancer with lumpectomy in 2008. EXAM: DIGITAL DIAGNOSTIC BILATERAL MAMMOGRAM WITH CAD AND TOMO COMPARISON:  Previous exam(s). ACR Breast Density Category c: The breast tissue is heterogeneously dense, which may obscure small masses. FINDINGS: 2D and 3D full field views of both breasts and a magnification view of the most recent lumpectomy site demonstrate no suspicious mass, nonsurgical distortion or worrisome calcifications. Bilateral lumpectomy changes again noted. Mammographic images were processed with CAD. IMPRESSION: No evidence of breast malignancy. RECOMMENDATION: Bilateral diagnostic mammogram in 1 year as clinically indicated. I have discussed the findings and recommendations with the patient. If applicable, a reminder letter will be sent to the patient regarding the next appointment. BI-RADS CATEGORY  2: Benign. Electronically Signed   By: Margarette Canada M.D.   On: 09/06/2018 10:15    ELIGIBLE FOR AVAILABLE RESEARCH PROTOCOL: no  ASSESSMENT:  85 y.o. [place] woman   (1) status post left lumpectomy March 2008 for a pT1c invasive ductal carcinoma, grade 1, strongly estrogen and progesterone receptor positive, HER-2 not amplified, with a borderline  MIB-1  (a) received tamoxifen, but very intermittently  (2) status post right lumpectomy and sentinel lymph node sampling 06/28/2008 for a pT1c pN0, stage IA invasive ductal carcinoma, grade 1, estrogen receptor 96% positive, progesterone receptor negative, HER-2/neu negative, with an MIB-1 of 14%  (a) received anastrozole April 2010 to April 2013  (3) status post excision of an area of lobular carcinoma in situ, left breast, 07/02/2011  (a) on tamoxifen April 2013 to April 2015.  (4) status post biopsy of the right breast lower inner quadrant 09/27/2016 for a clinical T1a N0 invasive lobular carcinoma, E-cadherin negative, estrogen and progesterone receptor positive, with no HER-2 amplification and an MIB-1 of 5%.  (5) right lumpectomy without sentinel lymph node sampling 11/18/2016 found a pT1b cN0, stage IA invasive lobular breast cancer, grade 2, with negative margins  (6) adjuvant radiation 12/21/2016 - 01/17/2017 Site/dose:   Right breast/ 42.5 Gy in 17 fractions                    Boost: 7.5 Gy in 3 fractions  (7) tamoxifen for 5 years started 03/05/2017  (8) offered genetics testing at meeting with genetics counselor 11/15/2016, but declined  PLAN: Tami Jacobson is now just about 2 years out from definitive surgery for breast cancer with no evidence of disease recurrence.  This is very favorable.  She is tolerating tamoxifen well and the plan will be to continue that a total of 5 years.  She had lab work drawn on Aug 23, 2018 through her primary care physician and it shows a creatinine of 1.17, normal transaminases, white cell count of 5.9 hemoglobin 12.2 and platelets 144,000  We discussed her mammogram which was very favorable  She will see me again in a year, after her next mammogram.  She knows to call for any other issues that may develop before the next visit.   Magrinat, Virgie Dad, MD  09/20/18 2:25 PM Medical Oncology and Hematology [facility] [address], [place] [address] Tel. [phone]    Fax. [phone]  I, Jacqualyn Posey am acting as a Education administrator for Chauncey Cruel, MD.   I, Lurline Del MD, have reviewed the above documentation for accuracy and completeness, and I agree with the above.

## 2018-09-19 NOTE — Telephone Encounter (Signed)
Contacted pt to verify telephone visit for pre reg °

## 2018-09-20 ENCOUNTER — Inpatient Hospital Stay: Payer: Medicare Other | Attending: Oncology | Admitting: Oncology

## 2018-09-20 ENCOUNTER — Other Ambulatory Visit: Payer: Medicare Other

## 2018-09-20 DIAGNOSIS — Z800 Family history of malignant neoplasm of digestive organs: Secondary | ICD-10-CM | POA: Insufficient documentation

## 2018-09-20 DIAGNOSIS — E119 Type 2 diabetes mellitus without complications: Secondary | ICD-10-CM

## 2018-09-20 DIAGNOSIS — Z853 Personal history of malignant neoplasm of breast: Secondary | ICD-10-CM

## 2018-09-20 DIAGNOSIS — E538 Deficiency of other specified B group vitamins: Secondary | ICD-10-CM

## 2018-09-20 DIAGNOSIS — Z803 Family history of malignant neoplasm of breast: Secondary | ICD-10-CM | POA: Insufficient documentation

## 2018-09-20 DIAGNOSIS — Z170 Estrogen receptor positive status [ER+]: Secondary | ICD-10-CM | POA: Insufficient documentation

## 2018-09-20 DIAGNOSIS — Z79899 Other long term (current) drug therapy: Secondary | ICD-10-CM | POA: Insufficient documentation

## 2018-09-20 DIAGNOSIS — Z8249 Family history of ischemic heart disease and other diseases of the circulatory system: Secondary | ICD-10-CM | POA: Insufficient documentation

## 2018-09-20 DIAGNOSIS — Z818 Family history of other mental and behavioral disorders: Secondary | ICD-10-CM | POA: Diagnosis not present

## 2018-09-20 DIAGNOSIS — K219 Gastro-esophageal reflux disease without esophagitis: Secondary | ICD-10-CM

## 2018-09-20 DIAGNOSIS — Z923 Personal history of irradiation: Secondary | ICD-10-CM

## 2018-09-20 DIAGNOSIS — Z90710 Acquired absence of both cervix and uterus: Secondary | ICD-10-CM | POA: Diagnosis not present

## 2018-09-20 DIAGNOSIS — C50311 Malignant neoplasm of lower-inner quadrant of right female breast: Secondary | ICD-10-CM | POA: Insufficient documentation

## 2018-09-20 DIAGNOSIS — Z825 Family history of asthma and other chronic lower respiratory diseases: Secondary | ICD-10-CM | POA: Diagnosis not present

## 2018-09-20 DIAGNOSIS — Z79810 Long term (current) use of selective estrogen receptor modulators (SERMs): Secondary | ICD-10-CM

## 2018-09-20 DIAGNOSIS — M1990 Unspecified osteoarthritis, unspecified site: Secondary | ICD-10-CM | POA: Diagnosis not present

## 2018-09-27 NOTE — Progress Notes (Addendum)
Subjective:   Tami Jacobson is a 85 y.o. female who presents for Medicare Annual (Subsequent) preventive examination.  Review of Systems:   Cardiac Risk Factors include: advanced age (>45mn, >>84women);diabetes mellitus;hypertension Sleep patterns: feels rested on waking, gets up 0-1 times nightly to void and sleeps 6-7 hours nightly.    Home Safety/Smoke Alarms: Feels safe in home. Smoke alarms in place.  Living environment; residence and Firearm Safety: apartment. Lives alone, no needs for DME, limited support system Seat Belt Safety/Bike Helmet: Wears seat belt.      Objective:     Vitals: BP 136/64   Pulse 70   Resp 18   Ht '5\' 3"'  (1.6 m)   Wt 132 lb (59.9 kg)   SpO2 98%   BMI 23.38 kg/m   Body mass index is 23.38 kg/m.  Advanced Directives 09/28/2018 09/22/2017 02/28/2017 12/02/2016 12/02/2016 11/18/2016 11/11/2016  Does Patient Have a Medical Advance Directive? Yes Yes Yes No Yes Yes Yes  Type of AParamed[place]of AP[place] Mountain [place] will [place] will [place] will - - Living will Living will  Does patient want to make changes to medical advance directive? Yes (ED - Information included in AVS) - - - - No - Patient declined No - Patient declined  Copy of [place] Chart? No - copy requested No - copy requested - - - No - copy requested -  Pre-existing out of facility DNR order (yellow form or pink MOST form) - - - - - - -    Tobacco Social History   Tobacco Use  Smoking Status Never Smoker  Smokeless Tobacco Never Used     Counseling given: Not Answered  Past Medical History:  Diagnosis Date  . Arthritis    left hip  . Breast cancer, right ([facility] 11/2016  . Family history of breast cancer   . Family history of colon cancer   . Family history of pancreatic cancer   . GERD (gastroesophageal reflux disease)   . Non-insulin dependent type 2 diabetes mellitus (HGlynn   . PAC  (premature atrial contraction)   . Personal history of radiation therapy 12/2016  . Premature ventricular contraction   . Seasonal allergies   . Sensitive skin   . Vitamin B12 deficiency    Past Surgical History:  Procedure Laterality Date  . ABDOMINAL HYSTERECTOMY  1979   partial  . BREAST BIOPSY  07/02/2011   Procedure: BREAST BIOPSY WITH NEEDLE LOCALIZATION;  Surgeon: DShann Medal MD;  Location: [facility]  Service: General;  Laterality: Left;  left breast atypical hyperplasia needle localization biopsy  . BREAST BIOPSY Left 06/13/2006  . BREAST BIOPSY Right 06/27/2008  . BREAST BIOPSY Right 09/27/2016  . BREAST EXCISIONAL BIOPSY Left 07/02/2011  . BREAST LUMPECTOMY Left 07/04/2006  . BREAST LUMPECTOMY Right 06/26/2008  . BREAST LUMPECTOMY Right 09/27/2016  . BREAST LUMPECTOMY WITH RADIOACTIVE SEED LOCALIZATION Right 11/18/2016   Procedure: RIGHT BREAST LUMPECTOMY WITH RADIOACTIVE SEED LOCALIZATION ERAS PATHWAY;  Surgeon: NAlphonsa Overall MD;  [facility]  Service: General;  Laterality: Right;  ERAS PATHWAY  . CATARACT EXTRACTION Bilateral   . CHOLECYSTECTOMY, LAPAROSCOPIC  07/07/2001   had appendex taking out at the same time  . ESOPHAGEAL DILATION  2006  . LAPAROSCOPIC APPENDECTOMY  07/07/2001  . TONSILLECTOMY  1942   Family History  Problem Relation Age of Onset  . Colon cancer Mother 668 . Heart attack Mother   .  Breast cancer Sister 67  . Heart disease Maternal Uncle        CABG  . Breast cancer Sister 13  . Heart disease Brother   . Other Father        suicide  . Breast cancer Sister 41  . Heart disease Sister   . COPD Brother   . Heart disease Brother   . Breast cancer Other 67       niece - brother's daughter  . Pancreatic cancer Other        brother's son, dx in his 83s   Social History   Socioeconomic History  . Marital status: Divorced    Spouse name: Marcello Moores  . Number of children: 1  . Years of education: Not on file  . Highest  education level: Not on file  Occupational History  . Occupation: retire  Social Needs  . Financial resource strain: Not hard at all  . Food insecurity    Worry: Never true    Inability: Never true  . Transportation needs    Medical: No    Non-medical: No  Tobacco Use  . Smoking status: Never Smoker  . Smokeless tobacco: Never Used  Substance and Sexual Activity  . Alcohol use: No    Alcohol/week: 0.0 standard drinks  . Drug use: No  . Sexual activity: Not Currently  Lifestyle  . Physical activity    Days per week: 2 days    Minutes per session: 30 min  . Stress: Not at all  Relationships  . Social connections    Talks on phone: More than three times a week    Gets together: More than three times a week    Attends religious service: More than 4 times per year    Active member of club or organization: Not on file    Attends meetings of clubs or organizations: 1 to 4 times per year    Relationship status: Divorced  Other Topics Concern  . Not on file  Social History Narrative  . Not on file    Outpatient Encounter Medications as of 09/28/2018  Medication Sig  . blood glucose meter kit and supplies KIT Dispense based on patient and insurance preference. Check sugars once daily and as needed as directed.  . clobetasol cream (TEMOVATE) 9.83 % 1 application as needed. Irritated skin  . cyanocobalamin (,VITAMIN B-12,) 1000 MCG/ML injection Inject 1,000 mcg into the muscle every 30 (thirty) days.   . flecainide (TAMBOCOR) 50 MG tablet TAKE 1 TABLET BY MOUTH TWO  TIMES DAILY  . glucose blood test strip accu check test strips DxE11.09--use to check blood sugars once daily  . metFORMIN (GLUCOPHAGE-XR) 500 MG 24 hr tablet TAKE 1 TABLET BY MOUTH ONCE DAILY WITH BREAKFAST  . metoprolol succinate (TOPROL-XL) 50 MG 24 hr tablet TAKE 1 TABLET BY MOUTH  DAILY  . naproxen sodium (ANAPROX) 220 MG tablet Take 220 mg by mouth as needed.  Marland Kitchen omeprazole (PRILOSEC) 20 MG capsule Take 1 tablet by  mouth before breakfast.  . tamoxifen (NOLVADEX) 20 MG tablet TAKE 1 TABLET BY MOUTH  DAILY  . [DISCONTINUED] Multiple Vitamin (MULTIVITAMIN) capsule Take 1 capsule by mouth daily. (Patient not taking: Reported on 09/28/2018)  . [EXPIRED] cyanocobalamin ((VITAMIN B-12)) injection 1,000 mcg    No facility-administered encounter medications on file as of 09/28/2018.     Activities of Daily Living In your present state of health, do you have any difficulty performing the following activities: 09/28/2018  Hearing?  N  Vision? N  Difficulty concentrating or making decisions? N  Walking or climbing stairs? N  Dressing or bathing? N  Doing errands, shopping? N  Preparing Food and eating ? N  Using the Toilet? N  In the past six months, have you accidently leaked urine? N  Do you have problems with loss of bowel control? N  Managing your Medications? N  Managing your Finances? N  Housekeeping or managing your Housekeeping? N  Some recent data might be hidden    Patient Care Team: Binnie Rail, MD as PCP - General (Internal Medicine) Stanford Breed, Denice Bors, MD as Consulting Physician (Cardiology) Magrinat, Virgie Dad, MD as Consulting Physician (Hematology and Oncology) Newton Pigg, MD as Consulting Physician (Obstetrics and Gynecology) Alphonsa Overall, MD as Consulting Physician (General Surgery) Rozetta Nunnery, MD as Consulting Physician (Otolaryngology) Kyung Rudd, MD as Consulting Physician (Radiation Oncology) Delice Bison Charlestine Massed, NP as Nurse Practitioner (Hematology and Oncology) Luberta Mutter, MD as Consulting Physician (Ophthalmology)    Assessment:   This is a routine wellness examination for Cherlynn. Physical assessment deferred to PCP.   Exercise Activities and Dietary recommendations Current Exercise Habits: Home exercise routine, Type of exercise: walking, Time (Minutes): 30, Frequency (Times/Week): 2, Weekly Exercise (Minutes/Week): 60, Intensity: Mild, Exercise  limited by: orthopedic condition(s)  Diet (meal preparation, eat out, water intake, caffeinated beverages, dairy products, fruits and vegetables): in general, a "healthy" diet  , well balanced   Reviewed heart healthy and diabetic diet. Encouraged patient to increase daily water and healthy fluid intake.   Goals    . Maintian current health status     Continue to eat healthy, stay active and independent as much as possible    . Patient Stated     I want to continue to do the crossword puzzle and read as much as possible. I will stay socially active by going to church and participating in some of my churches' activity.        Fall Risk Fall Risk  09/28/2018 09/22/2017 02/28/2017 12/02/2016 12/02/2016  Falls in the past year? 0 No No No No  Number falls in past yr: 0 - - - -  Risk for fall due to : Impaired balance/gait - - - -  Follow up Falls prevention discussed - - - -   Depression Screen PHQ 2/9 Scores 09/28/2018 09/22/2017 02/28/2017 09/27/2016  PHQ - 2 Score '4 1 1 ' 0  PHQ- 9 Score 10 3 - -     Cognitive Function MMSE - Mini Mental State Exam 09/22/2017 09/27/2016  Not completed: - Refused  Orientation to time 5 -  Orientation to Place 5 -  Registration 3 -  Attention/ Calculation 5 -  Recall 3 -  Language- name 2 objects 2 -  Language- repeat 1 -  Language- follow 3 step command 3 -  Language- read & follow direction 1 -  Write a sentence 1 -  Copy design 1 -  Total score 30 -       Ad8 score reviewed for issues:  Issues making decisions: no  Less interest in hobbies / activities: no  Repeats questions, stories (family complaining): no  Trouble using ordinary gadgets (microwave, computer, phone):no  Forgets the month or year: no  Mismanaging finances: no  Remembering appts: no  Daily problems with thinking and/or memory: no Ad8 score is= 0  Immunization History  Administered Date(s) Administered  . Influenza Split 12/14/2011  . Influenza Whole  02/01/2007, 01/03/2009, 12/15/2010  .  Influenza, High Dose Seasonal PF 01/01/2014, 12/25/2014, 12/15/2015, 12/24/2016, 12/27/2017  . Influenza,inj,Quad PF,6+ Mos 12/26/2012  . Pneumococcal Conjugate-13 07/02/2014  . Pneumococcal Polysaccharide-23 02/16/2002, 02/09/2012  . Td 06/10/2009   Screening Tests Health Maintenance  Topic Date Due  . FOOT EXAM  01/24/2018  . OPHTHALMOLOGY EXAM  08/16/2018  . INFLUENZA VACCINE  11/04/2018  . TETANUS/TDAP  06/11/2019  . URINE MICROALBUMIN  08/23/2019  . DEXA SCAN  Completed  . PNA vac Low Risk Adult  Completed      Plan:     Will schedule an appointment with PCP to follow-up regarding patient's feelings of depression.   Continue to eat heart healthy diet (full of fruits, vegetables, whole grains, lean protein, water--limit salt, fat, and sugar intake) and increase physical activity as tolerated.  Continue doing brain stimulating activities (puzzles, reading, adult coloring books, staying active) to keep memory sharp.   I have personally reviewed and noted the following in the patient's chart:   . Medical and social history . Use of alcohol, tobacco or illicit drugs  . Current medications and supplements . Functional ability and status . Nutritional status . Physical activity . Advanced directives . List of other physicians . Vitals . Screenings to include cognitive, depression, and falls . Referrals and appointments  In addition, I have reviewed and discussed with patient certain preventive protocols, quality metrics, and best practice recommendations. A written personalized care plan for preventive services as well as general preventive health recommendations were provided to patient.     Michiel Cowboy, RN  09/28/2018    Medical screening examination/treatment/procedure(s) were performed by non-physician practitioner and as supervising physician I was immediately available for consultation/collaboration. I agree with above. Binnie Rail, MD

## 2018-09-28 ENCOUNTER — Other Ambulatory Visit: Payer: Self-pay

## 2018-09-28 ENCOUNTER — Telehealth: Payer: Self-pay | Admitting: *Deleted

## 2018-09-28 ENCOUNTER — Ambulatory Visit (INDEPENDENT_AMBULATORY_CARE_PROVIDER_SITE_OTHER): Payer: Medicare Other | Admitting: *Deleted

## 2018-09-28 VITALS — BP 136/64 | HR 70 | Resp 18 | Ht 63.0 in | Wt 132.0 lb

## 2018-09-28 DIAGNOSIS — Z0000 Encounter for general adult medical examination without abnormal findings: Secondary | ICD-10-CM

## 2018-09-28 DIAGNOSIS — E119 Type 2 diabetes mellitus without complications: Secondary | ICD-10-CM | POA: Diagnosis not present

## 2018-09-28 DIAGNOSIS — E538 Deficiency of other specified B group vitamins: Secondary | ICD-10-CM | POA: Diagnosis not present

## 2018-09-28 MED ORDER — CYANOCOBALAMIN 1000 MCG/ML IJ SOLN
1000.0000 ug | Freq: Once | INTRAMUSCULAR | Status: AC
  Administered 2018-09-28: 1000 ug via INTRAMUSCULAR

## 2018-09-28 NOTE — Addendum Note (Signed)
Addended by: Emelia Loron A on: 09/28/2018 01:02 PM   Modules accepted: Orders

## 2018-09-28 NOTE — Patient Instructions (Addendum)
Continue doing brain stimulating activities (puzzles, reading, adult coloring books, staying active) to keep memory sharp.   Continue to eat heart healthy diet (full of fruits, vegetables, whole grains, lean protein, water--limit salt, fat, and sugar intake) and increase physical activity as tolerated.   Tami Jacobson , Thank you for taking time to come for your Medicare Wellness Visit. I appreciate your ongoing commitment to your health goals. Please review the following plan we discussed and let me know if I can assist you in the future.   These are the goals we discussed: Goals    . Maintian current health status     Continue to eat healthy, stay active and independent as much as possible    . Patient Stated     I want to continue to do the crossword puzzle and read as much as possible. I will stay socially active by going to church and participating in some of my churches' activity.        This is a list of the screening recommended for you and due dates:  Health Maintenance  Topic Date Due  . Complete foot exam   01/24/2018  . Eye exam for diabetics  08/16/2018  . Flu Shot  11/04/2018  . Tetanus Vaccine  06/11/2019  . Urine Protein Check  08/23/2019  . DEXA scan (bone density measurement)  Completed  . Pneumonia vaccines  Completed    Preventive Care 83 Years and Older, Female Preventive care refers to lifestyle choices and visits with your health care provider that can promote health and wellness. What does preventive care include?  A yearly physical exam. This is also called an annual well check.  Dental exams once or twice a year.  Routine eye exams. Ask your health care provider how often you should have your eyes checked.  Personal lifestyle choices, including: ? Daily care of your teeth and gums. ? Regular physical activity. ? Eating a healthy diet. ? Avoiding tobacco and drug use. ? Limiting alcohol use. ? Practicing safe sex. ? Taking low-dose aspirin every  day. ? Taking vitamin and mineral supplements as recommended by your health care provider. What happens during an annual well check? The services and screenings done by your health care provider during your annual well check will depend on your age, overall health, lifestyle risk factors, and family history of disease. Counseling Your health care provider may ask you questions about your:  Alcohol use.  Tobacco use.  Drug use.  Emotional well-being.  Home and relationship well-being.  Sexual activity.  Eating habits.  History of falls.  Memory and ability to understand (cognition).  Work and work Statistician.  Reproductive health.  Screening You may have the following tests or measurements:  Height, weight, and BMI.  Blood pressure.  Lipid and cholesterol levels. These may be checked every 5 years, or more frequently if you are over 30 years old.  Skin check.  Lung cancer screening. You may have this screening every year starting at age 48 if you have a 30-pack-year history of smoking and currently smoke or have quit within the past 15 years.  Colorectal cancer screening. All adults should have this screening starting at age 43 and continuing until age 41. You will have tests every 1-10 years, depending on your results and the type of screening test. People at increased risk should start screening at an earlier age. Screening tests may include: ? Guaiac-based fecal occult blood testing. ? Fecal immunochemical test (FIT). ? Stool  DNA test. ? Virtual colonoscopy. ? Sigmoidoscopy. During this test, a flexible tube with a tiny camera (sigmoidoscope) is used to examine your rectum and lower colon. The sigmoidoscope is inserted through your anus into your rectum and lower colon. ? Colonoscopy. During this test, a long, thin, flexible tube with a tiny camera (colonoscope) is used to examine your entire colon and rectum.  Hepatitis C blood test.  Hepatitis B blood  test.  Sexually transmitted disease (STD) testing.  Diabetes screening. This is done by checking your blood sugar (glucose) after you have not eaten for a while (fasting). You may have this done every 1-3 years.  Bone density scan. This is done to screen for osteoporosis. You may have this done starting at age 24.  Mammogram. This may be done every 1-2 years. Talk to your health care provider about how often you should have regular mammograms. Talk with your health care provider about your test results, treatment options, and if necessary, the need for more tests. Vaccines Your health care provider may recommend certain vaccines, such as:  Influenza vaccine. This is recommended every year.  Tetanus, diphtheria, and acellular pertussis (Tdap, Td) vaccine. You may need a Td booster every 10 years.  Varicella vaccine. You may need this if you have not been vaccinated.  Zoster vaccine. You may need this after age 4.  Measles, mumps, and rubella (MMR) vaccine. You may need at least one dose of MMR if you were born in 1957 or later. You may also need a second dose.  Pneumococcal 13-valent conjugate (PCV13) vaccine. One dose is recommended after age 36.  Pneumococcal polysaccharide (PPSV23) vaccine. One dose is recommended after age 62.  Meningococcal vaccine. You may need this if you have certain conditions.  Hepatitis A vaccine. You may need this if you have certain conditions or if you travel or work in places where you may be exposed to hepatitis A.  Hepatitis B vaccine. You may need this if you have certain conditions or if you travel or work in places where you may be exposed to hepatitis B.  Haemophilus influenzae type b (Hib) vaccine. You may need this if you have certain conditions. Talk to your health care provider about which screenings and vaccines you need and how often you need them. This information is not intended to replace advice given to you by your health care  provider. Make sure you discuss any questions you have with your health care provider. Document Released: 04/18/2015 Document Revised: 05/12/2017 Document Reviewed: 01/21/2015 Elsevier Interactive Patient Education  2019 [employer].

## 2018-09-28 NOTE — Telephone Encounter (Signed)
Discussed with PCP patient's feelings of depression and that patient expressed desire to try medication as a treatment. An appointment with PCP was scheduled 10/03/18 to evaluate.

## 2018-10-02 NOTE — Progress Notes (Addendum)
Subjective:    Patient ID: Tami Jacobson, female    DOB: 06-26-1927, 85 y.o.   MRN: [id number]  HPI The patient is here for an acute visit.   Depression: Her last visit we did discuss her depression and at that time she did not want to try another medication.  During her wellness visit with our nurse Sharee Pimple she discussed her depression more and admitted that she probably did not give the medication we tried previously enough of a try to see if it worked.  She did state that she had constipation and shakiness with the medication.  She does think that she would benefit from medication.    She states she may have a little bit of anxiety, but primarily of his depression.  She does feel very lonely, especially now that she is not able to get out.  She does do crossword puzzles and reads, which helps.  She states she sleeps well.  Her appetite is reduced, which is not new.  She often does not know which she feels like eating and does not want to cook.   She has a history of breast cancer in the right and left breast and is currently on tamoxifen.  Medications and allergies reviewed with patient and updated if appropriate.  Patient Active Problem List   Diagnosis Date Noted  . Genetic testing 08/30/2017  . Depression 06/01/2017  . Family history of breast cancer   . Family history of colon cancer   . Family history of pancreatic cancer   . Malignant neoplasm of lower-inner quadrant of right breast of female, estrogen receptor positive ([place]) 10/28/2016  . Abnormal electrocardiogram 05/05/2015  . BPPV (benign paroxysmal positional vertigo) 10/01/2014  . Murmur 04/18/2014  . Anal fissure 12/16/2011  . Fibrocystic breast changes. Left.  Biopsy 07/02/2011. 07/14/2011  . History of breast cancer, Right, T1c, N0, lumpectomy 06/26/2008.   Left, T1c, N0, lumpectomy 06/24/2006. 05/12/2011  . Diabetes mellitus type 2, controlled ([place]) 06/11/2009  . PREMATURE VENTRICULAR CONTRACTIONS 08/19/2008  .  B12 deficiency 08/29/2007  . ESOPHAGEAL STRICTURE 04/27/2007  . GERD (gastroesophageal reflux disease) 04/27/2007  . DIVERTICULOSIS, COLON 04/27/2007  . OTHER ALOPECIA 02/14/2007  . HYPERLIPIDEMIA 01/02/2007  . Essential hypertension 01/02/2007  . Carotid stenosis 08/22/2006    Current Outpatient Medications on File Prior to Visit  Medication Sig Dispense Refill  . blood glucose meter kit and supplies KIT Dispense based on patient and insurance preference. Check sugars once daily and as needed as directed. 1 each 0  . clobetasol cream (TEMOVATE) 2.44 % 1 application as needed. Irritated skin    . cyanocobalamin (,VITAMIN B-12,) 1000 MCG/ML injection Inject 1,000 mcg into the muscle every 30 (thirty) days.     . flecainide (TAMBOCOR) 50 MG tablet TAKE 1 TABLET BY MOUTH TWO  TIMES DAILY 180 tablet 3  . glucose blood test strip accu check test strips DxE11.09--use to check blood sugars once daily 100 each 12  . metoprolol succinate (TOPROL-XL) 50 MG 24 hr tablet TAKE 1 TABLET BY MOUTH  DAILY 90 tablet 3  . naproxen sodium (ANAPROX) 220 MG tablet Take 220 mg by mouth as needed.    Marland Kitchen omeprazole (PRILOSEC) 20 MG capsule Take 1 tablet by mouth before breakfast. 30 capsule 11   No current facility-administered medications on file prior to visit.     Past Medical History:  Diagnosis Date  . Arthritis    left hip  . Breast cancer, right ([place]) 11/2016  .  Family history of breast cancer   . Family history of colon cancer   . Family history of pancreatic cancer   . GERD (gastroesophageal reflux disease)   . Non-insulin dependent type 2 diabetes mellitus ([place])   . PAC (premature atrial contraction)   . Personal history of radiation therapy 12/2016  . Premature ventricular contraction   . Seasonal allergies   . Sensitive skin   . Vitamin B12 deficiency     Past Surgical History:  Procedure Laterality Date  . ABDOMINAL HYSTERECTOMY  1979   partial  . BREAST BIOPSY  07/02/2011    Procedure: BREAST BIOPSY WITH NEEDLE LOCALIZATION;  Surgeon: Shann Medal, MD;  Location: [facility];  Service: General;  Laterality: Left;  left breast atypical hyperplasia needle localization biopsy  . BREAST BIOPSY Left 06/13/2006  . BREAST BIOPSY Right 06/27/2008  . BREAST BIOPSY Right 09/27/2016  . BREAST EXCISIONAL BIOPSY Left 07/02/2011  . BREAST LUMPECTOMY Left 07/04/2006  . BREAST LUMPECTOMY Right 06/26/2008  . BREAST LUMPECTOMY Right 09/27/2016  . BREAST LUMPECTOMY WITH RADIOACTIVE SEED LOCALIZATION Right 11/18/2016   Procedure: RIGHT BREAST LUMPECTOMY WITH RADIOACTIVE SEED LOCALIZATION ERAS PATHWAY;  Surgeon: Alphonsa Overall, MD;  Location: [facility];  Service: General;  Laterality: Right;  ERAS PATHWAY  . CATARACT EXTRACTION Bilateral   . CHOLECYSTECTOMY, LAPAROSCOPIC  07/07/2001   had appendex taking out at the same time  . ESOPHAGEAL DILATION  2006  . LAPAROSCOPIC APPENDECTOMY  07/07/2001  . TONSILLECTOMY  1942    Social History   Socioeconomic History  . Marital status: Divorced    Spouse name: Marcello Moores  . Number of children: 1  . Years of education: Not on file  . Highest education level: Not on file  Occupational History  . Occupation: retire  Social Needs  . Financial resource strain: Not hard at all  . Food insecurity    Worry: Never true    Inability: Never true  . Transportation needs    Medical: No    Non-medical: No  Tobacco Use  . Smoking status: Never Smoker  . Smokeless tobacco: Never Used  Substance and Sexual Activity  . Alcohol use: No    Alcohol/week: 0.0 standard drinks  . Drug use: No  . Sexual activity: Not Currently  Lifestyle  . Physical activity    Days per week: 2 days    Minutes per session: 30 min  . Stress: Not at all  Relationships  . Social connections    Talks on phone: More than three times a week    Gets together: More than three times a week    Attends religious service: More than 4 times per year    Active  member of club or organization: Not on file    Attends meetings of clubs or organizations: 1 to 4 times per year    Relationship status: Divorced  Other Topics Concern  . Not on file  Social History Narrative  . Not on file    Family History  Problem Relation Age of Onset  . Colon cancer Mother 36  . Heart attack Mother   . Breast cancer Sister 70  . Heart disease Maternal Uncle        CABG  . Breast cancer Sister 38  . Heart disease Brother   . Other Father        suicide  . Breast cancer Sister 1  . Heart disease Sister   . COPD Brother   . Heart  disease Brother   . Breast cancer Other 27       niece - brother's daughter  . Pancreatic cancer Other        brother's son, dx in his 21s    Review of Systems  Constitutional: Positive for appetite change (does know what she wants to eat or cook).  Gastrointestinal: Negative for constipation.  Psychiatric/Behavioral: Positive for dysphoric mood. Negative for sleep disturbance. The patient is nervous/anxious (sometimes).        Objective:   Vitals:   10/03/18 1115  BP: (!) 142/78  Pulse: 65  Resp: 16  Temp: 98.7 F (37.1 C)  SpO2: 98%   BP Readings from Last 3 Encounters:  10/03/18 (!) 142/78  09/28/18 136/64  08/23/18 (!) 144/80   Wt Readings from Last 3 Encounters:  10/03/18 133 lb (60.3 kg)  09/28/18 132 lb (59.9 kg)  08/23/18 133 lb 12.8 oz (60.7 kg)   Body mass index is 23.56 kg/m.     Office Visit from 10/03/2018 in [place] [place]  PHQ-9 Total Score  10        Physical Exam Constitutional:      General: She is not in acute distress.    Appearance: Normal appearance. She is not ill-appearing.  Skin:    General: Skin is warm and dry.  Neurological:     Mental Status: She is alert.  Psychiatric:        Behavior: Behavior normal.        Thought Content: Thought content normal.        Judgment: Judgment normal.     Comments: Slightly depressed mood and affect.  Does not  appear anxious.            Assessment & Plan:    See Problem List for Assessment and Plan of chronic medical problems.

## 2018-10-03 ENCOUNTER — Ambulatory Visit (INDEPENDENT_AMBULATORY_CARE_PROVIDER_SITE_OTHER): Payer: Medicare Other | Admitting: Internal Medicine

## 2018-10-03 ENCOUNTER — Other Ambulatory Visit: Payer: Self-pay

## 2018-10-03 ENCOUNTER — Other Ambulatory Visit: Payer: Self-pay | Admitting: Internal Medicine

## 2018-10-03 ENCOUNTER — Encounter: Payer: Self-pay | Admitting: Internal Medicine

## 2018-10-03 ENCOUNTER — Other Ambulatory Visit: Payer: Self-pay | Admitting: Oncology

## 2018-10-03 ENCOUNTER — Telehealth: Payer: Self-pay

## 2018-10-03 VITALS — BP 142/78 | HR 65 | Temp 98.7°F | Resp 16 | Ht 63.0 in | Wt 133.0 lb

## 2018-10-03 DIAGNOSIS — F3289 Other specified depressive episodes: Secondary | ICD-10-CM | POA: Diagnosis not present

## 2018-10-03 DIAGNOSIS — C50311 Malignant neoplasm of lower-inner quadrant of right female breast: Secondary | ICD-10-CM

## 2018-10-03 MED ORDER — VENLAFAXINE HCL 25 MG PO TABS: 25.0000 mg | ORAL_TABLET | Freq: Two times a day (BID) | ORAL | 0 refills | Status: DC

## 2018-10-03 MED ORDER — VENLAFAXINE HCL 25 MG PO TABS: 25.0000 mg | ORAL_TABLET | Freq: Two times a day (BID) | ORAL | 5 refills | Status: DC

## 2018-10-03 NOTE — Telephone Encounter (Signed)
Copied from [place] [id number]. Topic: General - Other >> Oct 03, 2018  3:12 PM Rainey Pines A wrote: Patient would like a callback from [facility] in regards to her medication venlafaxine (EFFEXOR) 25 MG tablet.

## 2018-10-03 NOTE — Assessment & Plan Note (Signed)
We both agree that she would benefit from medication She is depressed without anxiety Given that she is on tamoxifen venlafaxine is the safest medication for her and we will retry this.  She will monitor for constipation and trauma/achiness We will start with a very low dose 25 mg once daily for the first week and if tolerated increase to twice a day Telephone follow-up in 6 weeks, but advised her to call sooner if she has any questions or side effects Deferred to seeing a counselor

## 2018-10-03 NOTE — Patient Instructions (Signed)
Take the effexor 25 mg daily in the morning after one week you can increase it to twice a week.    Lets do a follow up phone call in 6 weeks.

## 2018-10-03 NOTE — Telephone Encounter (Signed)
Pt states medication was too expensive at [facility]. Wanted me to call [facility] to see if it would be cheaper.

## 2018-10-04 DIAGNOSIS — H524 Presbyopia: Secondary | ICD-10-CM | POA: Diagnosis not present

## 2018-10-04 DIAGNOSIS — H35363 Drusen (degenerative) of macula, bilateral: Secondary | ICD-10-CM | POA: Diagnosis not present

## 2018-10-04 DIAGNOSIS — H02102 Unspecified ectropion of right lower eyelid: Secondary | ICD-10-CM | POA: Diagnosis not present

## 2018-10-04 DIAGNOSIS — E119 Type 2 diabetes mellitus without complications: Secondary | ICD-10-CM | POA: Diagnosis not present

## 2018-10-04 LAB — HM DIABETES EYE EXAM

## 2018-10-05 NOTE — Telephone Encounter (Signed)
Patient called stating she does not need a call back, she got everything straight with her medications.  Call back # [phone]

## 2018-10-05 NOTE — Telephone Encounter (Signed)
Noted  

## 2018-10-09 ENCOUNTER — Encounter: Payer: Self-pay | Admitting: Internal Medicine

## 2018-10-10 ENCOUNTER — Telehealth: Payer: Self-pay

## 2018-10-10 NOTE — Telephone Encounter (Signed)
Pt wanted you to know that she can not take effexor. It makes her feel dizzy. She did not want to try another medication in place.

## 2018-10-10 NOTE — Telephone Encounter (Signed)
Copied from [place] [id number]. Topic: General - Other >> Oct 10, 2018 10:16 AM Sheran Luz wrote: Patient requesting call back from [facility] in regards to a medication. Patient would not disclose any additional information.

## 2018-10-11 DIAGNOSIS — H6123 Impacted cerumen, bilateral: Secondary | ICD-10-CM | POA: Diagnosis not present

## 2018-10-11 DIAGNOSIS — J300 Vasomotor rhinitis: Secondary | ICD-10-CM | POA: Diagnosis not present

## 2018-10-11 NOTE — Telephone Encounter (Signed)
Noted - would she consider talking to a therapist - it may help

## 2018-10-12 NOTE — Telephone Encounter (Signed)
Refused talking to anyone right now. Will call back if she changes her mind.

## 2018-10-23 DIAGNOSIS — L900 Lichen sclerosus et atrophicus: Secondary | ICD-10-CM | POA: Diagnosis not present

## 2018-10-30 ENCOUNTER — Other Ambulatory Visit: Payer: Self-pay

## 2018-10-30 ENCOUNTER — Ambulatory Visit (INDEPENDENT_AMBULATORY_CARE_PROVIDER_SITE_OTHER): Payer: Medicare Other

## 2018-10-30 DIAGNOSIS — E538 Deficiency of other specified B group vitamins: Secondary | ICD-10-CM | POA: Diagnosis not present

## 2018-10-30 MED ORDER — CYANOCOBALAMIN 1000 MCG/ML IJ SOLN
1000.0000 ug | Freq: Once | INTRAMUSCULAR | Status: AC
  Administered 2018-10-30: 1000 ug via INTRAMUSCULAR

## 2018-10-30 NOTE — Progress Notes (Signed)
b12 Injection given.   Tami Cartier J Felicie Kocher, MD  

## 2018-11-01 DIAGNOSIS — Z170 Estrogen receptor positive status [ER+]: Secondary | ICD-10-CM | POA: Diagnosis not present

## 2018-11-01 DIAGNOSIS — C50911 Malignant neoplasm of unspecified site of right female breast: Secondary | ICD-10-CM | POA: Diagnosis not present

## 2018-11-15 ENCOUNTER — Ambulatory Visit: Payer: Medicare Other | Admitting: Internal Medicine

## 2018-12-04 ENCOUNTER — Ambulatory Visit: Payer: Medicare Other

## 2018-12-05 ENCOUNTER — Ambulatory Visit: Payer: Medicare Other

## 2018-12-05 ENCOUNTER — Other Ambulatory Visit (INDEPENDENT_AMBULATORY_CARE_PROVIDER_SITE_OTHER): Payer: Medicare Other

## 2018-12-05 ENCOUNTER — Ambulatory Visit (INDEPENDENT_AMBULATORY_CARE_PROVIDER_SITE_OTHER): Payer: Medicare Other | Admitting: Internal Medicine

## 2018-12-05 ENCOUNTER — Encounter: Payer: Self-pay | Admitting: Internal Medicine

## 2018-12-05 ENCOUNTER — Other Ambulatory Visit: Payer: Self-pay

## 2018-12-05 VITALS — BP 142/78 | HR 50 | Temp 98.4°F | Resp 16

## 2018-12-05 DIAGNOSIS — K219 Gastro-esophageal reflux disease without esophagitis: Secondary | ICD-10-CM

## 2018-12-05 DIAGNOSIS — R413 Other amnesia: Secondary | ICD-10-CM

## 2018-12-05 DIAGNOSIS — E538 Deficiency of other specified B group vitamins: Secondary | ICD-10-CM

## 2018-12-05 DIAGNOSIS — E119 Type 2 diabetes mellitus without complications: Secondary | ICD-10-CM | POA: Diagnosis not present

## 2018-12-05 LAB — VITAMIN B12: Vitamin B-12: 497 pg/mL (ref 211–911)

## 2018-12-05 MED ORDER — CYANOCOBALAMIN 1000 MCG/ML IJ SOLN
1000.0000 ug | Freq: Once | INTRAMUSCULAR | Status: AC
  Administered 2018-12-05: 16:00:00 1000 ug via INTRAMUSCULAR

## 2018-12-05 NOTE — Assessment & Plan Note (Signed)
She has some concerns regarding her memory Currently feels her anxiety/depression are controlled-she had more depression than anxiety We will check B12 level, but she is getting monthly injections and I think that will be fine Discussed that I can refer her to neurology for further evaluation, but she declined She does read a lot and does puzzles-continue Stressed the importance of regular exercise Will monitor

## 2018-12-05 NOTE — Assessment & Plan Note (Signed)
GERD is controlled, but advised that if she does not need to take the omeprazole daily she should try tapering herself off slowly Can take Tums as needed Can take omeprazole as needed GERD diet Advised her to call with any questions or concerns

## 2018-12-05 NOTE — Addendum Note (Signed)
Addended by: Ander Slade on: 12/05/2018 03:36 PM   Modules accepted: Orders

## 2018-12-05 NOTE — Progress Notes (Signed)
Subjective:    Patient ID: Tami Jacobson, female    DOB: 10/28/1927, 85 y.o.   MRN: [id number]  HPI The patient is here for an acute visit.   She is not exercising regularly.     Diabetes: She is taking metformin daily and has been on it for at least a couple of years.  She recently read an article about metformin and is concerned that it is not allowing her to absorb B12.  She does take a multivitamin with B12, but has been getting monthly B12 injections for her B12 deficiency.  She is somewhat compliant with a diabetic diet.  GERD: She takes omeprazole daily.  She also read that this can affect B12 absorption.  She does not know if she really needs this on a daily basis because occasionally she will not take it and she does okay.  She has taken Tums on occasion.   When she stands up a lot of times she has very loud burps.  She denies any GERD.  She takes omeprazole daily.  She notices it mostly when she eats, sits down and then stands up.  She denies abdominal pain.    She has been able to get out more and feels her anxiety and depression are okay right now.  She states as long she can get out and see people she does not feel depressed.  Memory changes: She is concerned about her memory.  She thinks she forgets things she should not.  Medications and allergies reviewed with patient and updated if appropriate.  Patient Active Problem List   Diagnosis Date Noted  . Genetic testing 08/30/2017  . Depression 06/01/2017  . Family history of breast cancer   . Family history of colon cancer   . Family history of pancreatic cancer   . Malignant neoplasm of lower-inner quadrant of right breast of female, estrogen receptor positive ([place]) 10/28/2016  . Abnormal electrocardiogram 05/05/2015  . BPPV (benign paroxysmal positional vertigo) 10/01/2014  . Murmur 04/18/2014  . Anal fissure 12/16/2011  . Fibrocystic breast changes. Left.  Biopsy 07/02/2011. 07/14/2011  . History of breast  cancer, Right, T1c, N0, lumpectomy 06/26/2008.   Left, T1c, N0, lumpectomy 06/24/2006. 05/12/2011  . Diabetes mellitus type 2, controlled ([place]) 06/11/2009  . PREMATURE VENTRICULAR CONTRACTIONS 08/19/2008  . B12 deficiency 08/29/2007  . ESOPHAGEAL STRICTURE 04/27/2007  . GERD (gastroesophageal reflux disease) 04/27/2007  . DIVERTICULOSIS, COLON 04/27/2007  . OTHER ALOPECIA 02/14/2007  . HYPERLIPIDEMIA 01/02/2007  . Essential hypertension 01/02/2007  . Carotid stenosis 08/22/2006    Current Outpatient Medications on File Prior to Visit  Medication Sig Dispense Refill  . blood glucose meter kit and supplies KIT Dispense based on patient and insurance preference. Check sugars once daily and as needed as directed. 1 each 0  . clobetasol cream (TEMOVATE) 9.62 % 1 application as needed. Irritated skin    . cyanocobalamin (,VITAMIN B-12,) 1000 MCG/ML injection Inject 1,000 mcg into the muscle every 30 (thirty) days.     . flecainide (TAMBOCOR) 50 MG tablet TAKE 1 TABLET BY MOUTH TWO  TIMES DAILY 180 tablet 3  . glucose blood test strip accu check test strips DxE11.09--use to check blood sugars once daily 100 each 12  . metFORMIN (GLUCOPHAGE-XR) 500 MG 24 hr tablet TAKE 1 TABLET BY MOUTH ONCE DAILY WITH BREAKFAST 90 tablet 1  . metoprolol succinate (TOPROL-XL) 50 MG 24 hr tablet TAKE 1 TABLET BY MOUTH  DAILY 90 tablet 3  . naproxen  sodium (ANAPROX) 220 MG tablet Take 220 mg by mouth as needed.    Marland Kitchen omeprazole (PRILOSEC) 20 MG capsule Take 1 tablet by mouth before breakfast. 30 capsule 11  . tamoxifen (NOLVADEX) 20 MG tablet TAKE 1 TABLET BY MOUTH  DAILY 90 tablet 0   No current facility-administered medications on file prior to visit.     Past Medical History:  Diagnosis Date  . Arthritis    left hip  . Breast cancer, right ([place]) 11/2016  . Family history of breast cancer   . Family history of colon cancer   . Family history of pancreatic cancer   . GERD (gastroesophageal reflux disease)    . Non-insulin dependent type 2 diabetes mellitus ([place])   . PAC (premature atrial contraction)   . Personal history of radiation therapy 12/2016  . Premature ventricular contraction   . Seasonal allergies   . Sensitive skin   . Vitamin B12 deficiency     Past Surgical History:  Procedure Laterality Date  . ABDOMINAL HYSTERECTOMY  1979   partial  . BREAST BIOPSY  07/02/2011   Procedure: BREAST BIOPSY WITH NEEDLE LOCALIZATION;  Surgeon: Shann Medal, MD;  Location: [place];  Service: General;  Laterality: Left;  left breast atypical hyperplasia needle localization biopsy  . BREAST BIOPSY Left 06/13/2006  . BREAST BIOPSY Right 06/27/2008  . BREAST BIOPSY Right 09/27/2016  . BREAST EXCISIONAL BIOPSY Left 07/02/2011  . BREAST LUMPECTOMY Left 07/04/2006  . BREAST LUMPECTOMY Right 06/26/2008  . BREAST LUMPECTOMY Right 09/27/2016  . BREAST LUMPECTOMY WITH RADIOACTIVE SEED LOCALIZATION Right 11/18/2016   Procedure: RIGHT BREAST LUMPECTOMY WITH RADIOACTIVE SEED LOCALIZATION ERAS PATHWAY;  Surgeon: Alphonsa Overall, MD;  Location: [facility];  Service: General;  Laterality: Right;  ERAS PATHWAY  . CATARACT EXTRACTION Bilateral   . CHOLECYSTECTOMY, LAPAROSCOPIC  07/07/2001   had appendex taking out at the same time  . ESOPHAGEAL DILATION  2006  . LAPAROSCOPIC APPENDECTOMY  07/07/2001  . TONSILLECTOMY  1942    Social History   Socioeconomic History  . Marital status: Divorced    Spouse name: Marcello Moores  . Number of children: 1  . Years of education: Not on file  . Highest education level: Not on file  Occupational History  . Occupation: retire  Social Needs  . Financial resource strain: Not hard at all  . Food insecurity    Worry: Never true    Inability: Never true  . Transportation needs    Medical: No    Non-medical: No  Tobacco Use  . Smoking status: Never Smoker  . Smokeless tobacco: Never Used  Substance and Sexual Activity  . Alcohol use: No    Alcohol/week: 0.0  standard drinks  . Drug use: No  . Sexual activity: Not Currently  Lifestyle  . Physical activity    Days per week: 2 days    Minutes per session: 30 min  . Stress: Not at all  Relationships  . Social connections    Talks on phone: More than three times a week    Gets together: More than three times a week    Attends religious service: More than 4 times per year    Active member of club or organization: Not on file    Attends meetings of clubs or organizations: 1 to 4 times per year    Relationship status: Divorced  Other Topics Concern  . Not on file  Social History Narrative  . Not on file  Family History  Problem Relation Age of Onset  . Colon cancer Mother 103  . Heart attack Mother   . Breast cancer Sister 88  . Heart disease Maternal Uncle        CABG  . Breast cancer Sister 24  . Heart disease Brother   . Other Father        suicide  . Breast cancer Sister 77  . Heart disease Sister   . COPD Brother   . Heart disease Brother   . Breast cancer Other 20       niece - brother's daughter  . Pancreatic cancer Other        brother's son, dx in his 63s    Review of Systems  Constitutional: Negative for chills and fever.  Respiratory: Negative for cough, shortness of breath and wheezing.   Cardiovascular: Negative for chest pain, palpitations and leg swelling.  Gastrointestinal: Negative for abdominal pain.       GERD controlled, but having burping  Neurological: Negative for numbness.  Psychiatric/Behavioral: Negative for dysphoric mood. The patient is not nervous/anxious.        Memory concerns       Objective:   Vitals:   12/05/18 1052  BP: (!) 142/78  Pulse: (!) 50  Resp: 16  Temp: 98.4 F (36.9 C)  SpO2: 97%   BP Readings from Last 3 Encounters:  12/05/18 (!) 142/78  10/03/18 (!) 142/78  09/28/18 136/64   Wt Readings from Last 3 Encounters:  10/03/18 133 lb (60.3 kg)  09/28/18 132 lb (59.9 kg)  08/23/18 133 lb 12.8 oz (60.7 kg)   There  is no height or weight on file to calculate BMI.   Physical Exam    Constitutional: Appears well-developed and well-nourished. No distress.  HENT:  Head: Normocephalic and atraumatic.  Neck: Neck supple. No tracheal deviation present. No thyromegaly present.  No cervical lymphadenopathy Cardiovascular: Normal rate, regular rhythm and normal heart sounds.   No murmur heard. No carotid bruit .  No edema Pulmonary/Chest: Effort normal and breath sounds normal. No respiratory distress. No has no wheezes. No rales.  Skin: Skin is warm and dry. Not diaphoretic.  Psychiatric: Normal mood and affect. Behavior is normal.       Assessment & Plan:    See Problem List for Assessment and Plan of chronic medical problems.

## 2018-12-05 NOTE — Assessment & Plan Note (Signed)
Sugars fairly controlled on metformin Discussed that patient would only interfere with oral B12 supplementation, but since she is getting B12 injections she does not need to worry about test Discussed options of discontinuing metformin and possibly needing another medication Stressed the importance of regular exercise and better compliance with a diabetic diet She decided to continue metformin for now Follow-up in November

## 2018-12-05 NOTE — Assessment & Plan Note (Signed)
Has not absorb B12 well for years and has been on monthly injections Will continue and she will get 1 today Given her concern regarding absorption we will go ahead and check a B12 level prior to her getting an injection Discussed with her that her oral medications will not affect the absorption of the injections, only oral B12  Continue monthly B12 injections

## 2018-12-05 NOTE — Patient Instructions (Addendum)
Try cutting back on the omeprazole - take Tums if needed.   Continue the metformin daily.     Your B12 injection will be absorbed no matter what medications you are on.     Have your blood work today to check your B12 level   You will have the B12 injection after you have the blood work done.

## 2019-01-04 ENCOUNTER — Other Ambulatory Visit: Payer: Self-pay | Admitting: Oncology

## 2019-01-04 ENCOUNTER — Other Ambulatory Visit: Payer: Self-pay

## 2019-01-04 ENCOUNTER — Ambulatory Visit (INDEPENDENT_AMBULATORY_CARE_PROVIDER_SITE_OTHER): Payer: Medicare Other

## 2019-01-04 DIAGNOSIS — Z23 Encounter for immunization: Secondary | ICD-10-CM | POA: Diagnosis not present

## 2019-01-04 DIAGNOSIS — E538 Deficiency of other specified B group vitamins: Secondary | ICD-10-CM | POA: Diagnosis not present

## 2019-01-04 DIAGNOSIS — C50311 Malignant neoplasm of lower-inner quadrant of right female breast: Secondary | ICD-10-CM

## 2019-01-04 DIAGNOSIS — Z170 Estrogen receptor positive status [ER+]: Secondary | ICD-10-CM

## 2019-01-04 MED ORDER — CYANOCOBALAMIN 1000 MCG/ML IJ SOLN
1000.0000 ug | Freq: Once | INTRAMUSCULAR | Status: AC
  Administered 2019-01-04: 14:00:00 1000 ug via INTRAMUSCULAR

## 2019-01-04 NOTE — Progress Notes (Signed)
b12 Injection and flu vaccine given.   Stacy J Burns, MD  

## 2019-01-04 NOTE — Progress Notes (Signed)
fliu

## 2019-01-22 ENCOUNTER — Other Ambulatory Visit: Payer: Self-pay | Admitting: Internal Medicine

## 2019-02-05 ENCOUNTER — Ambulatory Visit (INDEPENDENT_AMBULATORY_CARE_PROVIDER_SITE_OTHER): Payer: Medicare Other

## 2019-02-05 ENCOUNTER — Other Ambulatory Visit: Payer: Self-pay

## 2019-02-05 DIAGNOSIS — E538 Deficiency of other specified B group vitamins: Secondary | ICD-10-CM | POA: Diagnosis not present

## 2019-02-05 MED ORDER — CYANOCOBALAMIN 1000 MCG/ML IJ SOLN
1000.0000 ug | Freq: Once | INTRAMUSCULAR | Status: AC
  Administered 2019-02-05: 1000 ug via INTRAMUSCULAR

## 2019-02-05 NOTE — Progress Notes (Signed)
b12 Injection given.   Dezire Turk J Vi Whitesel, MD  

## 2019-02-08 ENCOUNTER — Other Ambulatory Visit: Payer: Self-pay

## 2019-02-08 DIAGNOSIS — Z20822 Contact with and (suspected) exposure to covid-19: Secondary | ICD-10-CM

## 2019-02-09 LAB — NOVEL CORONAVIRUS, NAA: SARS-CoV-2, NAA: NOT DETECTED

## 2019-02-21 NOTE — Progress Notes (Signed)
Subjective:    Patient ID: Tami Jacobson, female    DOB: 1927/11/20, 85 y.o.   MRN: [id number]  HPI The patient is here for follow up.  She is not exercising regularly.    Hypertension: She is taking her medication daily. She is compliant with a low sodium diet.  She denies chest pain, palpitations, edema, shortness of breath and regular headaches. She does not monitor her blood pressure at home.    Diabetes: She is taking her medication daily as prescribed. She is compliant with a diabetic diet.  She monitors her sugars and they have been running 140's-150's.    Hyperlipidemia: She is intolerant of statins. She is compliant with a low fat/cholesterol diet.    GERD:  She is taking her medication daily as prescribed.  She denies any GERD symptoms and feels her GERD is well controlled.   B12 def:  She gets monthly injections.  She is not due until next month.    Depression, anxiety:  She still feels depressed and anxious, but did not tolerate the medication and is limited on what medications she can take due to tamoxifen.    Medications and allergies reviewed with patient and updated if appropriate.  Patient Active Problem List   Diagnosis Date Noted  . Memory difficulty 12/05/2018  . Genetic testing 08/30/2017  . Depression 06/01/2017  . Family history of breast cancer   . Family history of colon cancer   . Family history of pancreatic cancer   . Malignant neoplasm of lower-inner quadrant of right breast of female, estrogen receptor positive ([place]) 10/28/2016  . Abnormal electrocardiogram 05/05/2015  . BPPV (benign paroxysmal positional vertigo) 10/01/2014  . Murmur 04/18/2014  . Anal fissure 12/16/2011  . Fibrocystic breast changes. Left.  Biopsy 07/02/2011. 07/14/2011  . History of breast cancer, Right, T1c, N0, lumpectomy 06/26/2008.   Left, T1c, N0, lumpectomy 06/24/2006. 05/12/2011  . Diabetes mellitus type 2, controlled ([place]) 06/11/2009  . PREMATURE VENTRICULAR  CONTRACTIONS 08/19/2008  . B12 deficiency 08/29/2007  . ESOPHAGEAL STRICTURE 04/27/2007  . GERD (gastroesophageal reflux disease) 04/27/2007  . DIVERTICULOSIS, COLON 04/27/2007  . OTHER ALOPECIA 02/14/2007  . HYPERLIPIDEMIA 01/02/2007  . Essential hypertension 01/02/2007  . Carotid stenosis 08/22/2006    Current Outpatient Medications on File Prior to Visit  Medication Sig Dispense Refill  . ACCU-CHEK GUIDE test strip USE AS DIRECTED 100 strip 3  . blood glucose meter kit and supplies KIT Dispense based on patient and insurance preference. Check sugars once daily and as needed as directed. 1 each 0  . clobetasol cream (TEMOVATE) 4.28 % 1 application as needed. Irritated skin    . cyanocobalamin (,VITAMIN B-12,) 1000 MCG/ML injection Inject 1,000 mcg into the muscle every 30 (thirty) days.     . flecainide (TAMBOCOR) 50 MG tablet TAKE 1 TABLET BY MOUTH TWO  TIMES DAILY 180 tablet 3  . metFORMIN (GLUCOPHAGE-XR) 500 MG 24 hr tablet TAKE 1 TABLET BY MOUTH ONCE DAILY WITH BREAKFAST 90 tablet 1  . metoprolol succinate (TOPROL-XL) 50 MG 24 hr tablet TAKE 1 TABLET BY MOUTH  DAILY 90 tablet 3  . naproxen sodium (ANAPROX) 220 MG tablet Take 220 mg by mouth as needed.    Marland Kitchen omeprazole (PRILOSEC) 20 MG capsule Take 1 tablet by mouth before breakfast. 30 capsule 11  . tamoxifen (NOLVADEX) 20 MG tablet TAKE 1 TABLET BY MOUTH  DAILY 90 tablet 3  . terconazole (TERAZOL 7) 0.4 % vaginal cream  No current facility-administered medications on file prior to visit.     Past Medical History:  Diagnosis Date  . Arthritis    left hip  . Breast cancer, right ([place]) 11/2016  . Family history of breast cancer   . Family history of colon cancer   . Family history of pancreatic cancer   . GERD (gastroesophageal reflux disease)   . Non-insulin dependent type 2 diabetes mellitus ([place])   . PAC (premature atrial contraction)   . Personal history of radiation therapy 12/2016  . Premature ventricular  contraction   . Seasonal allergies   . Sensitive skin   . Vitamin B12 deficiency     Past Surgical History:  Procedure Laterality Date  . ABDOMINAL HYSTERECTOMY  1979   partial  . BREAST BIOPSY  07/02/2011   Procedure: BREAST BIOPSY WITH NEEDLE LOCALIZATION;  Surgeon: Shann Medal, MD;  Location: [facility];  Service: General;  Laterality: Left;  left breast atypical hyperplasia needle localization biopsy  . BREAST BIOPSY Left 06/13/2006  . BREAST BIOPSY Right 06/27/2008  . BREAST BIOPSY Right 09/27/2016  . BREAST EXCISIONAL BIOPSY Left 07/02/2011  . BREAST LUMPECTOMY Left 07/04/2006  . BREAST LUMPECTOMY Right 06/26/2008  . BREAST LUMPECTOMY Right 09/27/2016  . BREAST LUMPECTOMY WITH RADIOACTIVE SEED LOCALIZATION Right 11/18/2016   Procedure: RIGHT BREAST LUMPECTOMY WITH RADIOACTIVE SEED LOCALIZATION ERAS PATHWAY;  Surgeon: Alphonsa Overall, MD;  Location: [facility];  Service: General;  Laterality: Right;  ERAS PATHWAY  . CATARACT EXTRACTION Bilateral   . CHOLECYSTECTOMY, LAPAROSCOPIC  07/07/2001   had appendex taking out at the same time  . ESOPHAGEAL DILATION  2006  . LAPAROSCOPIC APPENDECTOMY  07/07/2001  . TONSILLECTOMY  1942    Social History   Socioeconomic History  . Marital status: Divorced    Spouse name: Marcello Moores  . Number of children: 1  . Years of education: Not on file  . Highest education level: Not on file  Occupational History  . Occupation: retire  Social Needs  . Financial resource strain: Not hard at all  . Food insecurity    Worry: Never true    Inability: Never true  . Transportation needs    Medical: No    Non-medical: No  Tobacco Use  . Smoking status: Never Smoker  . Smokeless tobacco: Never Used  Substance and Sexual Activity  . Alcohol use: No    Alcohol/week: 0.0 standard drinks  . Drug use: No  . Sexual activity: Not Currently  Lifestyle  . Physical activity    Days per week: 2 days    Minutes per session: 30 min  . Stress:  Not at all  Relationships  . Social connections    Talks on phone: More than three times a week    Gets together: More than three times a week    Attends religious service: More than 4 times per year    Active member of club or organization: Not on file    Attends meetings of clubs or organizations: 1 to 4 times per year    Relationship status: Divorced  Other Topics Concern  . Not on file  Social History Narrative  . Not on file    Family History  Problem Relation Age of Onset  . Colon cancer Mother 35  . Heart attack Mother   . Breast cancer Sister 5  . Heart disease Maternal Uncle        CABG  . Breast cancer Sister 55  .  Heart disease Brother   . Other Father        suicide  . Breast cancer Sister 41  . Heart disease Sister   . COPD Brother   . Heart disease Brother   . Breast cancer Other 13       niece - brother's daughter  . Pancreatic cancer Other        brother's son, dx in his 81s    Review of Systems  Constitutional: Negative for chills and fever.  HENT: Positive for postnasal drip.   Respiratory: Positive for cough (PND, allergy related). Negative for shortness of breath and wheezing.   Cardiovascular: Negative for chest pain and palpitations.  Neurological: Positive for dizziness (with quick movements) and headaches (occ sinus HAs).  Psychiatric/Behavioral: Positive for dysphoric mood. The patient is nervous/anxious.        Objective:   Vitals:   02/22/19 1117  BP: 140/76  Pulse: 60  Temp: 98 F (36.7 C)  SpO2: 93%   BP Readings from Last 3 Encounters:  02/22/19 140/76  12/05/18 (!) 142/78  10/03/18 (!) 142/78   Wt Readings from Last 3 Encounters:  02/22/19 135 lb 1.3 oz (61.3 kg)  10/03/18 133 lb (60.3 kg)  09/28/18 132 lb (59.9 kg)   Body mass index is 23.93 kg/m.   Physical Exam    Constitutional: Appears well-developed and well-nourished. No distress.  HENT:  Head: Normocephalic and atraumatic.  Neck: Neck supple. No tracheal  deviation present. No thyromegaly present.  No cervical lymphadenopathy Cardiovascular: Normal rate, regular rhythm and normal heart sounds.   No murmur heard. No carotid bruit .  No edema Pulmonary/Chest: Effort normal and breath sounds normal. No respiratory distress. No has no wheezes. No rales.  Skin: Skin is warm and dry. Not diaphoretic.  Psychiatric: Normal mood and affect. Behavior is normal.      Assessment & Plan:    This visit occurred during the SARS-CoV-2 public health emergency.  Safety protocols were in place, including screening questions prior to the visit, additional usage of staff PPE, and extensive cleaning of exam room while observing appropriate contact time as indicated for disinfecting solutions.     See Problem List for Assessment and Plan of chronic medical problems.

## 2019-02-21 NOTE — Patient Instructions (Addendum)
  Tests ordered today. Your results will be released to MyChart (or called to you) after review.  If any changes need to be made, you will be notified at that same time.    Medications reviewed and updated.  Changes include :   none     Please followup in 6 months   

## 2019-02-22 ENCOUNTER — Other Ambulatory Visit: Payer: Self-pay

## 2019-02-22 ENCOUNTER — Ambulatory Visit (INDEPENDENT_AMBULATORY_CARE_PROVIDER_SITE_OTHER): Payer: Medicare Other | Admitting: Internal Medicine

## 2019-02-22 ENCOUNTER — Other Ambulatory Visit (INDEPENDENT_AMBULATORY_CARE_PROVIDER_SITE_OTHER): Payer: Medicare Other

## 2019-02-22 ENCOUNTER — Encounter: Payer: Self-pay | Admitting: Internal Medicine

## 2019-02-22 VITALS — BP 140/76 | HR 60 | Temp 98.0°F | Ht 63.0 in | Wt 135.1 lb

## 2019-02-22 DIAGNOSIS — E119 Type 2 diabetes mellitus without complications: Secondary | ICD-10-CM

## 2019-02-22 DIAGNOSIS — K219 Gastro-esophageal reflux disease without esophagitis: Secondary | ICD-10-CM

## 2019-02-22 DIAGNOSIS — F3289 Other specified depressive episodes: Secondary | ICD-10-CM

## 2019-02-22 DIAGNOSIS — E782 Mixed hyperlipidemia: Secondary | ICD-10-CM

## 2019-02-22 DIAGNOSIS — E538 Deficiency of other specified B group vitamins: Secondary | ICD-10-CM | POA: Diagnosis not present

## 2019-02-22 DIAGNOSIS — I10 Essential (primary) hypertension: Secondary | ICD-10-CM

## 2019-02-22 LAB — COMPREHENSIVE METABOLIC PANEL
ALT: 11 U/L (ref 0–35)
AST: 13 U/L (ref 0–37)
Albumin: 4.1 g/dL (ref 3.5–5.2)
Alkaline Phosphatase: 41 U/L (ref 39–117)
BUN: 17 mg/dL (ref 6–23)
CO2: 27 mEq/L (ref 19–32)
Calcium: 9.6 mg/dL (ref 8.4–10.5)
Chloride: 106 mEq/L (ref 96–112)
Creatinine, Ser: 1.24 mg/dL — ABNORMAL HIGH (ref 0.40–1.20)
GFR: 40.7 mL/min — ABNORMAL LOW (ref >60.00)
Glucose, Bld: 173 mg/dL — ABNORMAL HIGH (ref 70–99)
Potassium: 4.6 mEq/L (ref 3.5–5.1)
Sodium: 142 mEq/L (ref 135–145)
Total Bilirubin: 0.5 mg/dL (ref 0.2–1.2)
Total Protein: 6.8 g/dL (ref 6.0–8.3)

## 2019-02-22 LAB — CBC WITH DIFFERENTIAL/PLATELET
Basophils Absolute: 0 10*3/uL (ref 0.0–0.1)
Basophils Relative: 0.5 % (ref 0.0–3.0)
Eosinophils Absolute: 0.1 10*3/uL (ref 0.0–0.7)
Eosinophils Relative: 1 % (ref 0.0–5.0)
HCT: 35 % — ABNORMAL LOW (ref 36.0–46.0)
Hemoglobin: 11.8 g/dL — ABNORMAL LOW (ref 12.0–15.0)
Lymphocytes Relative: 22.6 % (ref 12.0–46.0)
Lymphs Abs: 1.5 10*3/uL (ref 0.7–4.0)
MCHC: 33.9 g/dL (ref 30.0–36.0)
MCV: 96.1 fl (ref 78.0–100.0)
Monocytes Absolute: 0.5 10*3/uL (ref 0.1–1.0)
Monocytes Relative: 8 % (ref 3.0–12.0)
Neutro Abs: 4.4 10*3/uL (ref 1.4–7.7)
Neutrophils Relative %: 67.9 % (ref 43.0–77.0)
Platelets: 148 10*3/uL — ABNORMAL LOW (ref 150.0–400.0)
RBC: 3.64 Mil/uL — ABNORMAL LOW (ref 3.87–5.11)
RDW: 13.6 % (ref 11.5–15.5)
WBC: 6.5 10*3/uL (ref 4.0–10.5)

## 2019-02-22 LAB — LIPID PANEL
Cholesterol: 173 mg/dL (ref 0–200)
HDL: 50.4 mg/dL (ref >39.00)
LDL Cholesterol: 92 mg/dL (ref 0–99)
NonHDL: 123.06
Total CHOL/HDL Ratio: 3
Triglycerides: 157 mg/dL — ABNORMAL HIGH (ref 0.0–149.0)
VLDL: 31.4 mg/dL (ref 0.0–40.0)

## 2019-02-22 LAB — HEMOGLOBIN A1C: Hgb A1c MFr Bld: 6.8 % — ABNORMAL HIGH (ref 4.6–6.5)

## 2019-02-22 NOTE — Assessment & Plan Note (Signed)
BP well controlled Current regimen effective and well tolerated Continue current medications at current doses cmp  

## 2019-02-22 NOTE — Assessment & Plan Note (Signed)
GERD controlled Continue daily medication  

## 2019-02-22 NOTE — Assessment & Plan Note (Signed)
Feels some depression  Did not tolerate the anti-depressant and she is limited in what she can take due to being on tamoxifen She tries to get out most days and that helps -- continue to be busy and stay connected

## 2019-02-22 NOTE — Assessment & Plan Note (Signed)
Intolerant of statins Check lipids, cmp

## 2019-02-22 NOTE — Assessment & Plan Note (Signed)
Check a1c Low sugar / carb diet Stressed regular exercise   

## 2019-02-22 NOTE — Assessment & Plan Note (Signed)
Getting injections on a monthly basis Not due today continue

## 2019-03-07 ENCOUNTER — Ambulatory Visit (INDEPENDENT_AMBULATORY_CARE_PROVIDER_SITE_OTHER): Payer: Medicare Other | Admitting: *Deleted

## 2019-03-07 ENCOUNTER — Other Ambulatory Visit: Payer: Self-pay

## 2019-03-07 DIAGNOSIS — E538 Deficiency of other specified B group vitamins: Secondary | ICD-10-CM

## 2019-03-07 MED ORDER — CYANOCOBALAMIN 1000 MCG/ML IJ SOLN
1000.0000 ug | Freq: Once | INTRAMUSCULAR | Status: AC
  Administered 2019-03-07: 1000 ug via INTRAMUSCULAR

## 2019-03-07 NOTE — Progress Notes (Signed)
b12 Injection given.   Tami Meller J Dunbar Buras, MD  

## 2019-03-30 ENCOUNTER — Other Ambulatory Visit: Payer: Self-pay | Admitting: Internal Medicine

## 2019-04-09 ENCOUNTER — Ambulatory Visit (INDEPENDENT_AMBULATORY_CARE_PROVIDER_SITE_OTHER): Payer: Medicare Other

## 2019-04-09 ENCOUNTER — Other Ambulatory Visit: Payer: Self-pay

## 2019-04-09 DIAGNOSIS — E538 Deficiency of other specified B group vitamins: Secondary | ICD-10-CM | POA: Diagnosis not present

## 2019-04-09 MED ORDER — CYANOCOBALAMIN 1000 MCG/ML IJ SOLN
1000.0000 ug | Freq: Once | INTRAMUSCULAR | Status: AC
  Administered 2019-04-09: 1000 ug via INTRAMUSCULAR

## 2019-04-09 NOTE — Progress Notes (Signed)
b12 Injection given.   Arella Blinder J Astrid Vides, MD  

## 2019-04-17 DIAGNOSIS — H5000 Unspecified esotropia: Secondary | ICD-10-CM | POA: Diagnosis not present

## 2019-04-17 DIAGNOSIS — H02105 Unspecified ectropion of left lower eyelid: Secondary | ICD-10-CM | POA: Diagnosis not present

## 2019-04-17 DIAGNOSIS — H02102 Unspecified ectropion of right lower eyelid: Secondary | ICD-10-CM | POA: Diagnosis not present

## 2019-04-17 DIAGNOSIS — H5340 Unspecified visual field defects: Secondary | ICD-10-CM | POA: Diagnosis not present

## 2019-05-04 ENCOUNTER — Encounter (INDEPENDENT_AMBULATORY_CARE_PROVIDER_SITE_OTHER): Payer: Self-pay | Admitting: Otolaryngology

## 2019-05-04 ENCOUNTER — Other Ambulatory Visit: Payer: Self-pay

## 2019-05-04 ENCOUNTER — Ambulatory Visit (INDEPENDENT_AMBULATORY_CARE_PROVIDER_SITE_OTHER): Payer: Medicare Other | Admitting: Otolaryngology

## 2019-05-04 VITALS — Temp 97.5°F

## 2019-05-04 DIAGNOSIS — J310 Chronic rhinitis: Secondary | ICD-10-CM | POA: Diagnosis not present

## 2019-05-04 DIAGNOSIS — H6123 Impacted cerumen, bilateral: Secondary | ICD-10-CM

## 2019-05-04 NOTE — Progress Notes (Signed)
HPI: Tami Jacobson is a 86 y.o. female who presents for evaluation of blockage of her left ear as well as headaches that she refers to as "sinus headaches".  She has just recently lost a close friend of hers to cancer.  She notices that headaches are improved when she puts pressure on her forehead.  She does have drainage from her nose but this is all clear..  Past Medical History:  Diagnosis Date  . Arthritis    left hip  . Breast cancer, right ([place]) 11/2016  . Family history of breast cancer   . Family history of colon cancer   . Family history of pancreatic cancer   . GERD (gastroesophageal reflux disease)   . Non-insulin dependent type 2 diabetes mellitus ([place])   . PAC (premature atrial contraction)   . Personal history of radiation therapy 12/2016  . Premature ventricular contraction   . Seasonal allergies   . Sensitive skin   . Vitamin B12 deficiency    Past Surgical History:  Procedure Laterality Date  . ABDOMINAL HYSTERECTOMY  1979   partial  . BREAST BIOPSY  07/02/2011   Procedure: BREAST BIOPSY WITH NEEDLE LOCALIZATION;  Surgeon: Shann Medal, MD;  Location: [facility];  Service: General;  Laterality: Left;  left breast atypical hyperplasia needle localization biopsy  . BREAST BIOPSY Left 06/13/2006  . BREAST BIOPSY Right 06/27/2008  . BREAST BIOPSY Right 09/27/2016  . BREAST EXCISIONAL BIOPSY Left 07/02/2011  . BREAST LUMPECTOMY Left 07/04/2006  . BREAST LUMPECTOMY Right 06/26/2008  . BREAST LUMPECTOMY Right 09/27/2016  . BREAST LUMPECTOMY WITH RADIOACTIVE SEED LOCALIZATION Right 11/18/2016   Procedure: RIGHT BREAST LUMPECTOMY WITH RADIOACTIVE SEED LOCALIZATION ERAS PATHWAY;  Surgeon: Alphonsa Overall, MD;  Location: [facility];  Service: General;  Laterality: Right;  ERAS PATHWAY  . CATARACT EXTRACTION Bilateral   . CHOLECYSTECTOMY, LAPAROSCOPIC  07/07/2001   had appendex taking out at the same time  . ESOPHAGEAL DILATION  2006  . LAPAROSCOPIC APPENDECTOMY   07/07/2001  . TONSILLECTOMY  1942   Social History   Socioeconomic History  . Marital status: Divorced    Spouse name: Marcello Moores  . Number of children: 1  . Years of education: Not on file  . Highest education level: Not on file  Occupational History  . Occupation: retire  Tobacco Use  . Smoking status: Never Smoker  . Smokeless tobacco: Never Used  Substance and Sexual Activity  . Alcohol use: No    Alcohol/week: 0.0 standard drinks  . Drug use: No  . Sexual activity: Not Currently  Other Topics Concern  . Not on file  Social History Narrative  . Not on file   Social Determinants of Health   Financial Resource Strain:   . Difficulty of Paying Living Expenses: Not on file  Food Insecurity:   . Worried About Charity fundraiser in the Last Year: Not on file  . Ran Out of Food in the Last Year: Not on file  Transportation Needs:   . Lack of Transportation (Medical): Not on file  . Lack of Transportation (Non-Medical): Not on file  Physical Activity: Insufficiently Active  . Days of Exercise per Week: 2 days  . Minutes of Exercise per Session: 30 min  Stress:   . Feeling of Stress : Not on file  Social Connections:   . Frequency of Communication with Friends and Family: Not on file  . Frequency of Social Gatherings with Friends and Family: Not on file  .  Attends Religious Services: Not on file  . Active Member of Clubs or Organizations: Not on file  . Attends Archivist Meetings: Not on file  . Marital Status: Not on file   Family History  Problem Relation Age of Onset  . Colon cancer Mother 20  . Heart attack Mother   . Breast cancer Sister 50  . Heart disease Maternal Uncle        CABG  . Breast cancer Sister 45  . Heart disease Brother   . Other Father        suicide  . Breast cancer Sister 84  . Heart disease Sister   . COPD Brother   . Heart disease Brother   . Breast cancer Other 44       niece - brother's daughter  . Pancreatic cancer  Other        brother's son, dx in his 30s   Allergies  Allergen Reactions  . Statins Other (See Comments)    GI UPSET  . Effexor [Venlafaxine]     Constipation, shaking, dizzy  . Cephalexin Other (See Comments)    UNKNOWN  . Codeine Other (See Comments)    UNKNOWN  . Lansoprazole Other (See Comments)    UNKNOWN  . Latex Other (See Comments)    UNKNOWN   . Levofloxacin Other (See Comments)    UNKNOWN  . Nitrofurantoin Other (See Comments)    UNKNOWN  . Rofecoxib Other (See Comments)    UNKNOWN    Prior to Admission medications   Medication Sig Start Date End Date Taking? Authorizing Provider  ACCU-CHEK GUIDE test strip USE AS DIRECTED 01/22/19  Yes Burns, Claudina Lick, MD  blood glucose meter kit and supplies KIT Dispense based on patient and insurance preference. Check sugars once daily and as needed as directed. 09/07/16  Yes Burns, Claudina Lick, MD  clobetasol cream (TEMOVATE) 9.02 % 1 application as needed. Irritated skin 06/21/11  Yes [provider]  cyanocobalamin (,VITAMIN B-12,) 1000 MCG/ML injection Inject 1,000 mcg into the muscle every 30 (thirty) days.    Yes [provider]  flecainide (TAMBOCOR) 50 MG tablet TAKE 1 TABLET BY MOUTH TWO  TIMES DAILY 08/02/18  Yes Lelon Perla, MD  metFORMIN (GLUCOPHAGE-XR) 500 MG 24 hr tablet TAKE 1 TABLET BY MOUTH ONCE DAILY WITH BREAKFAST 04/02/19  Yes Burns, Claudina Lick, MD  metoprolol succinate (TOPROL-XL) 50 MG 24 hr tablet TAKE 1 TABLET BY MOUTH  DAILY 08/02/18  Yes Lelon Perla, MD  naproxen sodium (ANAPROX) 220 MG tablet Take 220 mg by mouth as needed.   Yes [provider]  omeprazole (PRILOSEC) 20 MG capsule Take 1 tablet by mouth before breakfast. 04/06/18  Yes Esterwood, Amy S, PA-C  tamoxifen (NOLVADEX) 20 MG tablet TAKE 1 TABLET BY MOUTH  DAILY 01/04/19  Yes Magrinat, Virgie Dad, MD  terconazole (TERAZOL 7) 0.4 % vaginal cream  10/23/18  Yes [provider]     Positive ROS: Otherwise  negative  All other systems have been reviewed and were otherwise negative with the exception of those mentioned in the HPI and as above.  Physical Exam: Constitutional: Alert, well-appearing, no acute distress Ears: External ears without lesions or tenderness. Ear canals left ear canal is completely obstructed right ear canal is partially obstructed.  This was cleaned in the office using suction and curettes.  TMs were clear bilaterally.. Nasal: External nose without lesions. Clear nasal passages bilaterally on anterior rhinoscopy.  Both middle  meatus regions were clear.  Mild rhinitis. Oral: Oropharynx clear. Neck: No palpable adenopathy or masses Respiratory: Breathing comfortably  Skin: No facial/neck lesions or rash noted.  Cerumen impaction removal  Date/Time: 05/04/2019 2:41 PM Performed by: Rozetta Nunnery, MD Authorized by: Rozetta Nunnery, MD   Consent:    Consent obtained:  Verbal   Consent given by:  Patient   Risks discussed:  Pain and bleeding Procedure details:    Location:  L ear and R ear   Procedure type: curette and suction   Post-procedure details:    Inspection:  TM intact and canal normal   Hearing quality:  Improved   Patient tolerance of procedure:  Tolerated well, no immediate complications Comments:     Left ear canal was worse in the right ear canal.  TMs are clear bilaterally.    Assessment: Cerumen buildup Probable tension headaches. Mild rhinitis.  Plan: Ear canals were cleaned in the office. I refilled her Nasacort.  Radene Journey, MD

## 2019-05-07 ENCOUNTER — Telehealth: Payer: Self-pay | Admitting: Cardiology

## 2019-05-07 NOTE — Telephone Encounter (Signed)
New Message     Pt c/o BP issue: STAT if pt c/o blurred vision, one-sided weakness or slurred speech  1. What are your last 5 BP readings? 181/86 HR 67   2. Are you having any other symptoms (ex. Dizziness, headache, blurred vision, passed out)? Blurred vision, headache off and on for a week   3. What is your BP issue? Pt is calling and says she went to the [place] office and noticed she was having blurred vision. She checked her BP and got 181/86 hr 67

## 2019-05-07 NOTE — Telephone Encounter (Signed)
Spoke with pt, Aware of dr crenshaw's recommendations.  °

## 2019-05-07 NOTE — Telephone Encounter (Signed)
Spoke with pt who who is concerned about elevated BP. She states she last checked her BP a few minutes before call and confirms that BP 181/86 and HR 67. She states she checks her her BP about every 2-3 days, but does not keep a log of readings. Pt states she has been experiencing sharp pains to her temples and that this has occurred off and on for a few months. Pt states because she has had a hx of sinus HAs, she was not sure if HA's sinus-related. She states SBP usually ranges from 130-140s. Pt states she is also concerned about blurry vision. She states this is more noticeable when she is outdoors and she had issues seeing 'the white lines' while she was driving today. Pt confirms she is on all cardiac meds as prescribed. She would like Dr. Stanford Breed to advise

## 2019-05-07 NOTE — Telephone Encounter (Signed)
Would not change meds based on one reading; follow BP and will adjust based on results Kirk Ruths

## 2019-05-08 ENCOUNTER — Other Ambulatory Visit: Payer: Self-pay | Admitting: Physician Assistant

## 2019-05-08 ENCOUNTER — Other Ambulatory Visit: Payer: Self-pay

## 2019-05-08 ENCOUNTER — Ambulatory Visit (INDEPENDENT_AMBULATORY_CARE_PROVIDER_SITE_OTHER): Payer: Medicare Other | Admitting: Internal Medicine

## 2019-05-08 ENCOUNTER — Encounter: Payer: Self-pay | Admitting: Internal Medicine

## 2019-05-08 VITALS — BP 196/74 | HR 71 | Temp 98.5°F | Resp 16 | Ht 63.0 in | Wt 135.0 lb

## 2019-05-08 DIAGNOSIS — R011 Cardiac murmur, unspecified: Secondary | ICD-10-CM | POA: Diagnosis not present

## 2019-05-08 DIAGNOSIS — E538 Deficiency of other specified B group vitamins: Secondary | ICD-10-CM | POA: Diagnosis not present

## 2019-05-08 DIAGNOSIS — I10 Essential (primary) hypertension: Secondary | ICD-10-CM

## 2019-05-08 MED ORDER — CYANOCOBALAMIN 1000 MCG/ML IJ SOLN
1000.0000 ug | Freq: Once | INTRAMUSCULAR | Status: AC
  Administered 2019-05-08: 1000 ug via INTRAMUSCULAR

## 2019-05-08 MED ORDER — LOSARTAN POTASSIUM 50 MG PO TABS: 50.0000 mg | ORAL_TABLET | Freq: Every day | ORAL | 5 refills | Status: DC

## 2019-05-08 NOTE — Assessment & Plan Note (Signed)
Chronic Monthly B12 injections Due for injection-given today

## 2019-05-08 NOTE — Assessment & Plan Note (Signed)
Murmur seems increased Will order echocardiogram Has follow-up with cardiology next week

## 2019-05-08 NOTE — Assessment & Plan Note (Addendum)
Chronic Has had spikes in her blood pressure yesterday and today in the [id number] systolically and is very high here today Unsure of why blood pressure has been elevated She has had some headaches and blurry vision-advised that if she has blurry vision again she needs to go to the emergency room to be evaluated Continue metoprolol 50 mg daily Will add losartan 50 mg daily She will monitor her blood pressure several times between now and when she sees cardiology will bring in her readings Will need kidney function rechecked at that time She will call either myself or cardiology over the next week if there is any concerns

## 2019-05-08 NOTE — Progress Notes (Signed)
Subjective:    Patient ID: Tami Jacobson, female    DOB: 1927-07-28, 86 y.o.   MRN: [id number]  HPI The patient is here for an acute visit.  She is taking all of her medications as prescribed.     [place] and while walking out her vision became blurred.  The blurry vision lasted all afternoon.  Her BP this am at home 181/86, but during the day today it was 140's/?.  This morning it was 190/?Marland Kitchen  She checked it several more times today - 136-154/60's.    She has been unsteady, shaky when she walks, dry mouth and throat in addition to the above.  She has been having light frontal headaches almost daily.  She sometimes has a sharp pain in her temples that is not daily.   She took her BP at home several days ago and it was normal.  She did not check it on a daily basis.  She takes 1/2 Aleve PM as needed - she took it last night but did not take it the night prior.  She did not take any other advil or aleve.  She cannot think of any reason why her blood pressure would be spiking this high.   She has follow-up with cardiology next week.    Medications and allergies reviewed with patient and updated if appropriate.  Patient Active Problem List   Diagnosis Date Noted  . Memory difficulty 12/05/2018  . Genetic testing 08/30/2017  . Depression 06/01/2017  . Family history of breast cancer   . Family history of colon cancer   . Family history of pancreatic cancer   . Malignant neoplasm of lower-inner quadrant of right breast of female, estrogen receptor positive ([place]) 10/28/2016  . Abnormal electrocardiogram 05/05/2015  . BPPV (benign paroxysmal positional vertigo) 10/01/2014  . Murmur 04/18/2014  . Anal fissure 12/16/2011  . Fibrocystic breast changes. Left.  Biopsy 07/02/2011. 07/14/2011  . History of breast cancer, Right, T1c, N0, lumpectomy 06/26/2008.   Left, T1c, N0, lumpectomy 06/24/2006. 05/12/2011  . Diabetes mellitus type 2, controlled ([place])  06/11/2009  . PREMATURE VENTRICULAR CONTRACTIONS 08/19/2008  . B12 deficiency 08/29/2007  . ESOPHAGEAL STRICTURE 04/27/2007  . GERD (gastroesophageal reflux disease) 04/27/2007  . DIVERTICULOSIS, COLON 04/27/2007  . OTHER ALOPECIA 02/14/2007  . HYPERLIPIDEMIA 01/02/2007  . Essential hypertension 01/02/2007  . Carotid stenosis 08/22/2006    Current Outpatient Medications on File Prior to Visit  Medication Sig Dispense Refill  . ACCU-CHEK GUIDE test strip USE AS DIRECTED 100 strip 3  . blood glucose meter kit and supplies KIT Dispense based on patient and insurance preference. Check sugars once daily and as needed as directed. 1 each 0  . clobetasol cream (TEMOVATE) 7.06 % 1 application as needed. Irritated skin    . cyanocobalamin (,VITAMIN B-12,) 1000 MCG/ML injection Inject 1,000 mcg into the muscle every 30 (thirty) days.     . flecainide (TAMBOCOR) 50 MG tablet TAKE 1 TABLET BY MOUTH TWO  TIMES DAILY 180 tablet 3  . metFORMIN (GLUCOPHAGE-XR) 500 MG 24 hr tablet TAKE 1 TABLET BY MOUTH ONCE DAILY WITH BREAKFAST 90 tablet 3  . metoprolol succinate (TOPROL-XL) 50 MG 24 hr tablet TAKE 1 TABLET BY MOUTH  DAILY 90 tablet 3  . naproxen sodium (ANAPROX) 220 MG tablet Take 220 mg by mouth as needed.    Marland Kitchen omeprazole (PRILOSEC) 20 MG capsule Take 1 capsule (20 mg total) by mouth daily. Please schedule  an Office Visit for any further refills.  Thank you 90 capsule 0  . tamoxifen (NOLVADEX) 20 MG tablet TAKE 1 TABLET BY MOUTH  DAILY 90 tablet 3  . terconazole (TERAZOL 7) 0.4 % vaginal cream      No current facility-administered medications on file prior to visit.    Past Medical History:  Diagnosis Date  . Arthritis    left hip  . Breast cancer, right ([place]) 11/2016  . Family history of breast cancer   . Family history of colon cancer   . Family history of pancreatic cancer   . GERD (gastroesophageal reflux disease)   . Non-insulin dependent type 2 diabetes mellitus ([place])   . PAC  (premature atrial contraction)   . Personal history of radiation therapy 12/2016  . Premature ventricular contraction   . Seasonal allergies   . Sensitive skin   . Vitamin B12 deficiency     Past Surgical History:  Procedure Laterality Date  . ABDOMINAL HYSTERECTOMY  1979   partial  . BREAST BIOPSY  07/02/2011   Procedure: BREAST BIOPSY WITH NEEDLE LOCALIZATION;  Surgeon: Shann Medal, MD;  Location: [facility];  Service: General;  Laterality: Left;  left breast atypical hyperplasia needle localization biopsy  . BREAST BIOPSY Left 06/13/2006  . BREAST BIOPSY Right 06/27/2008  . BREAST BIOPSY Right 09/27/2016  . BREAST EXCISIONAL BIOPSY Left 07/02/2011  . BREAST LUMPECTOMY Left 07/04/2006  . BREAST LUMPECTOMY Right 06/26/2008  . BREAST LUMPECTOMY Right 09/27/2016  . BREAST LUMPECTOMY WITH RADIOACTIVE SEED LOCALIZATION Right 11/18/2016   Procedure: RIGHT BREAST LUMPECTOMY WITH RADIOACTIVE SEED LOCALIZATION ERAS PATHWAY;  Surgeon: Alphonsa Overall, MD;  Location: [facility];  Service: General;  Laterality: Right;  ERAS PATHWAY  . CATARACT EXTRACTION Bilateral   . CHOLECYSTECTOMY, LAPAROSCOPIC  07/07/2001   had appendex taking out at the same time  . ESOPHAGEAL DILATION  2006  . LAPAROSCOPIC APPENDECTOMY  07/07/2001  . TONSILLECTOMY  1942    Social History   Socioeconomic History  . Marital status: Divorced    Spouse name: Marcello Moores  . Number of children: 1  . Years of education: Not on file  . Highest education level: Not on file  Occupational History  . Occupation: retire  Tobacco Use  . Smoking status: Never Smoker  . Smokeless tobacco: Never Used  Substance and Sexual Activity  . Alcohol use: No    Alcohol/week: 0.0 standard drinks  . Drug use: No  . Sexual activity: Not Currently  Other Topics Concern  . Not on file  Social History Narrative  . Not on file   Social Determinants of Health   Financial Resource Strain:   . Difficulty of Paying Living  Expenses: Not on file  Food Insecurity:   . Worried About Charity fundraiser in the Last Year: Not on file  . Ran Out of Food in the Last Year: Not on file  Transportation Needs:   . Lack of Transportation (Medical): Not on file  . Lack of Transportation (Non-Medical): Not on file  Physical Activity: Insufficiently Active  . Days of Exercise per Week: 2 days  . Minutes of Exercise per Session: 30 min  Stress:   . Feeling of Stress : Not on file  Social Connections:   . Frequency of Communication with Friends and Family: Not on file  . Frequency of Social Gatherings with Friends and Family: Not on file  . Attends Religious Services: Not on file  . Active Member  of Clubs or Organizations: Not on file  . Attends Archivist Meetings: Not on file  . Marital Status: Not on file    Family History  Problem Relation Age of Onset  . Colon cancer Mother 61  . Heart attack Mother   . Breast cancer Sister 23  . Heart disease Maternal Uncle        CABG  . Breast cancer Sister 14  . Heart disease Brother   . Other Father        suicide  . Breast cancer Sister 61  . Heart disease Sister   . COPD Brother   . Heart disease Brother   . Breast cancer Other 62       niece - brother's daughter  . Pancreatic cancer Other        brother's son, dx in his 30s    Review of Systems  Constitutional: Negative for chills and fever.  Respiratory: Negative for shortness of breath.   Cardiovascular: Negative for chest pain, palpitations and leg swelling.  Neurological: Positive for dizziness (sometimes when she walks, unsteady) and headaches. Negative for speech difficulty, weakness and numbness.       Objective:   Vitals:   05/08/19 1446  BP: (!) 196/74  Pulse: 71  Resp: 16  Temp: 98.5 F (36.9 C)  SpO2: 96%   BP Readings from Last 3 Encounters:  05/08/19 (!) 196/74  02/22/19 140/76  12/05/18 (!) 142/78   Wt Readings from Last 3 Encounters:  05/08/19 135 lb (61.2 kg)    02/22/19 135 lb 1.3 oz (61.3 kg)  10/03/18 133 lb (60.3 kg)   Body mass index is 23.91 kg/m.   Physical Exam    Constitutional: Appears well-developed and well-nourished. No distress.  Head: Normocephalic and atraumatic.  Neck: Neck supple. No tracheal deviation present. No thyromegaly present.  No cervical lymphadenopathy Cardiovascular: Normal rate, regular rhythm and normal heart sounds.  3/6 systolic murmur heard. No carotid bruit .  No edema Pulmonary/Chest: Effort normal and breath sounds normal. No respiratory distress. No has no wheezes. No rales.  Skin: Skin is warm and dry. Not diaphoretic.  Psychiatric: Slightly anxious mood and affect. Behavior is normal.       Assessment & Plan:    See Problem List for Assessment and Plan of chronic medical problems.    This visit occurred during the SARS-CoV-2 public health emergency.  Safety protocols were in place, including screening questions prior to the visit, additional usage of staff PPE, and extensive cleaning of exam room while observing appropriate contact time as indicated for disinfecting solutions.

## 2019-05-08 NOTE — Progress Notes (Unsigned)
error 

## 2019-05-08 NOTE — Patient Instructions (Signed)
Medications reviewed and updated.  Changes include :   Start losartan 50 mg daily in the evening.  Continue all your other medications.    An Echo or ultrasound of your heart was ordered.  They will call you to schedule this.  You have your B12 injection today.   If you have any questions before seeing cardiology next week please call.

## 2019-05-09 ENCOUNTER — Ambulatory Visit: Payer: Medicare Other

## 2019-05-09 ENCOUNTER — Telehealth: Payer: Self-pay

## 2019-05-09 ENCOUNTER — Other Ambulatory Visit: Payer: Self-pay | Admitting: Cardiology

## 2019-05-09 NOTE — Telephone Encounter (Signed)
Patient calling and states that Dr Quay Burow advised her to take the losartan in the afternoon and the metoprolol in the morning. States that the pharmacy advised her to take the losartan in the morning. States that she would like to speak with Lovena Le or Dr Quay Burow in reference to this. Please advise.

## 2019-05-09 NOTE — Telephone Encounter (Signed)
Ok to take it in the morning with the metoprolol

## 2019-05-09 NOTE — Telephone Encounter (Signed)
Pt aware of response below and expressed understanding.  

## 2019-05-09 NOTE — Telephone Encounter (Signed)
Patient is calling to follow up in regards to BP. Patient refused to go into further detail and insisted on speaking with the nurse. Please call to discuss.

## 2019-05-09 NOTE — Telephone Encounter (Signed)
Attempted to call the patient back. The line was busy.

## 2019-05-10 NOTE — Progress Notes (Deleted)
HPI: FU palpitations and cerebrovascular disease; history of PACs and PVCs with normal LV function. She had significant palpitations in the past despite beta-blockade. She is therefore on flecainide 50 mg p.o. b.i.d. A previous catheterization in June 2000 showed normal coronary arteries and normal LV function. Echo 1/16 showed normal LV function, mild LAE. Nuclear study February 2017 showed ejection fraction 72%and normal perfusion.Carotid dopplers 2/18 showed1-39 bilateral stenosis. Echocardiogram February 2021 showed . Patient seen by primary care February 2 with note of elevated blood pressure.  Losartan was added to her medical regimen.  Since I last saw her,  Current Outpatient Medications  Medication Sig Dispense Refill  . ACCU-CHEK GUIDE test strip USE AS DIRECTED 100 strip 3  . blood glucose meter kit and supplies KIT Dispense based on patient and insurance preference. Check sugars once daily and as needed as directed. 1 each 0  . clobetasol cream (TEMOVATE) 1.75 % 1 application as needed. Irritated skin    . cyanocobalamin (,VITAMIN B-12,) 1000 MCG/ML injection Inject 1,000 mcg into the muscle every 30 (thirty) days.     . flecainide (TAMBOCOR) 50 MG tablet TAKE 1 TABLET BY MOUTH TWO  TIMES DAILY 180 tablet 3  . losartan (COZAAR) 50 MG tablet Take 1 tablet (50 mg total) by mouth daily. 30 tablet 5  . metFORMIN (GLUCOPHAGE-XR) 500 MG 24 hr tablet TAKE 1 TABLET BY MOUTH ONCE DAILY WITH BREAKFAST 90 tablet 3  . metoprolol succinate (TOPROL-XL) 50 MG 24 hr tablet TAKE 1 TABLET BY MOUTH  DAILY 90 tablet 3  . naproxen sodium (ANAPROX) 220 MG tablet Take 220 mg by mouth as needed.    Marland Kitchen omeprazole (PRILOSEC) 20 MG capsule Take 1 capsule (20 mg total) by mouth daily. Please schedule an Office Visit for any further refills.  Thank you 90 capsule 0  . tamoxifen (NOLVADEX) 20 MG tablet TAKE 1 TABLET BY MOUTH  DAILY 90 tablet 3  . terconazole (TERAZOL 7) 0.4 % vaginal cream      No  current facility-administered medications for this visit.     Past Medical History:  Diagnosis Date  . Arthritis    left hip  . Breast cancer, right ([place]) 11/2016  . Family history of breast cancer   . Family history of colon cancer   . Family history of pancreatic cancer   . GERD (gastroesophageal reflux disease)   . Non-insulin dependent type 2 diabetes mellitus ([place])   . PAC (premature atrial contraction)   . Personal history of radiation therapy 12/2016  . Premature ventricular contraction   . Seasonal allergies   . Sensitive skin   . Vitamin B12 deficiency     Past Surgical History:  Procedure Laterality Date  . ABDOMINAL HYSTERECTOMY  1979   partial  . BREAST BIOPSY  07/02/2011   Procedure: BREAST BIOPSY WITH NEEDLE LOCALIZATION;  Surgeon: Shann Medal, MD;  Location: [facility];  Service: General;  Laterality: Left;  left breast atypical hyperplasia needle localization biopsy  . BREAST BIOPSY Left 06/13/2006  . BREAST BIOPSY Right 06/27/2008  . BREAST BIOPSY Right 09/27/2016  . BREAST EXCISIONAL BIOPSY Left 07/02/2011  . BREAST LUMPECTOMY Left 07/04/2006  . BREAST LUMPECTOMY Right 06/26/2008  . BREAST LUMPECTOMY Right 09/27/2016  . BREAST LUMPECTOMY WITH RADIOACTIVE SEED LOCALIZATION Right 11/18/2016   Procedure: RIGHT BREAST LUMPECTOMY WITH RADIOACTIVE SEED LOCALIZATION ERAS PATHWAY;  Surgeon: Alphonsa Overall, MD;  Location: [facility];  Service: General;  Laterality: Right;  ERAS PATHWAY  . CATARACT EXTRACTION Bilateral   . CHOLECYSTECTOMY, LAPAROSCOPIC  07/07/2001   had appendex taking out at the same time  . ESOPHAGEAL DILATION  2006  . LAPAROSCOPIC APPENDECTOMY  07/07/2001  . TONSILLECTOMY  1942    Social History   Socioeconomic History  . Marital status: Divorced    Spouse name: Marcello Moores  . Number of children: 1  . Years of education: Not on file  . Highest education level: Not on file  Occupational History  . Occupation: retire  Tobacco Use    . Smoking status: Never Smoker  . Smokeless tobacco: Never Used  Substance and Sexual Activity  . Alcohol use: No    Alcohol/week: 0.0 standard drinks  . Drug use: No  . Sexual activity: Not Currently  Other Topics Concern  . Not on file  Social History Narrative  . Not on file   Social Determinants of Health   Financial Resource Strain:   . Difficulty of Paying Living Expenses: Not on file  Food Insecurity:   . Worried About Charity fundraiser in the Last Year: Not on file  . Ran Out of Food in the Last Year: Not on file  Transportation Needs:   . Lack of Transportation (Medical): Not on file  . Lack of Transportation (Non-Medical): Not on file  Physical Activity: Insufficiently Active  . Days of Exercise per Week: 2 days  . Minutes of Exercise per Session: 30 min  Stress:   . Feeling of Stress : Not on file  Social Connections:   . Frequency of Communication with Friends and Family: Not on file  . Frequency of Social Gatherings with Friends and Family: Not on file  . Attends Religious Services: Not on file  . Active Member of Clubs or Organizations: Not on file  . Attends Archivist Meetings: Not on file  . Marital Status: Not on file  Intimate Partner Violence:   . Fear of Current or Ex-Partner: Not on file  . Emotionally Abused: Not on file  . Physically Abused: Not on file  . Sexually Abused: Not on file    Family History  Problem Relation Age of Onset  . Colon cancer Mother 84  . Heart attack Mother   . Breast cancer Sister 65  . Heart disease Maternal Uncle        CABG  . Breast cancer Sister 62  . Heart disease Brother   . Other Father        suicide  . Breast cancer Sister 30  . Heart disease Sister   . COPD Brother   . Heart disease Brother   . Breast cancer Other 89       niece - brother's daughter  . Pancreatic cancer Other        brother's son, dx in his 2s    ROS: no fevers or chills, productive cough, hemoptysis, dysphasia,  odynophagia, melena, hematochezia, dysuria, hematuria, rash, seizure activity, orthopnea, PND, pedal edema, claudication. Remaining systems are negative.  Physical Exam: Well-developed well-nourished in no acute distress.  Skin is warm and dry.  HEENT is normal.  Neck is supple.  Chest is clear to auscultation with normal expansion.  Cardiovascular exam is regular rate and rhythm.  Abdominal exam nontender or distended. No masses palpated. Extremities show no edema. neuro grossly intact  ECG- personally reviewed  A/P  1 palpitations-this is felt secondary to PVCs.  Symptoms are controlled with metoprolol and flecainide.  We will  continue.  2 hypertension-  3 carotid artery disease-mild on most recent Dopplers.  Kirk Ruths, MD

## 2019-05-11 ENCOUNTER — Other Ambulatory Visit: Payer: Self-pay

## 2019-05-11 ENCOUNTER — Ambulatory Visit (HOSPITAL_COMMUNITY): Payer: Medicare Other | Attending: Cardiovascular Disease

## 2019-05-11 DIAGNOSIS — I10 Essential (primary) hypertension: Secondary | ICD-10-CM | POA: Insufficient documentation

## 2019-05-11 DIAGNOSIS — R011 Cardiac murmur, unspecified: Secondary | ICD-10-CM | POA: Diagnosis not present

## 2019-05-11 NOTE — Telephone Encounter (Signed)
Attempted to contact patient, LVM advising to call back to discuss BP. Left call back number.

## 2019-05-11 NOTE — Telephone Encounter (Signed)
Called patient, she states that she seen PCP Dr.Burns and she gave her losartan to take on 05/08/19. She states that she feels no different, but will give the medication more time to work. She will wait to see Dr.Crenshaw next week on 02/11.

## 2019-05-13 ENCOUNTER — Encounter: Payer: Self-pay | Admitting: Internal Medicine

## 2019-05-13 DIAGNOSIS — I071 Rheumatic tricuspid insufficiency: Secondary | ICD-10-CM | POA: Insufficient documentation

## 2019-05-13 DIAGNOSIS — I517 Cardiomegaly: Secondary | ICD-10-CM | POA: Insufficient documentation

## 2019-05-14 ENCOUNTER — Telehealth: Payer: Self-pay

## 2019-05-14 ENCOUNTER — Emergency Department (HOSPITAL_COMMUNITY): Payer: Medicare Other

## 2019-05-14 ENCOUNTER — Other Ambulatory Visit: Payer: Self-pay

## 2019-05-14 ENCOUNTER — Inpatient Hospital Stay (HOSPITAL_COMMUNITY)
Admission: EM | Admit: 2019-05-14 | Discharge: 2019-05-16 | DRG: 305 | Disposition: A | Discharge status: Home / Self Care | Payer: Medicare Other | Attending: Internal Medicine | Admitting: Internal Medicine

## 2019-05-14 DIAGNOSIS — I493 Ventricular premature depolarization: Secondary | ICD-10-CM | POA: Diagnosis not present

## 2019-05-14 DIAGNOSIS — R2689 Other abnormalities of gait and mobility: Secondary | ICD-10-CM | POA: Diagnosis not present

## 2019-05-14 DIAGNOSIS — I160 Hypertensive urgency: Principal | ICD-10-CM | POA: Diagnosis present

## 2019-05-14 DIAGNOSIS — Z853 Personal history of malignant neoplasm of breast: Secondary | ICD-10-CM | POA: Diagnosis not present

## 2019-05-14 DIAGNOSIS — I5032 Chronic diastolic (congestive) heart failure: Secondary | ICD-10-CM | POA: Diagnosis not present

## 2019-05-14 DIAGNOSIS — M1612 Unilateral primary osteoarthritis, left hip: Secondary | ICD-10-CM | POA: Diagnosis not present

## 2019-05-14 DIAGNOSIS — I10 Essential (primary) hypertension: Secondary | ICD-10-CM | POA: Diagnosis present

## 2019-05-14 DIAGNOSIS — Z885 Allergy status to narcotic agent status: Secondary | ICD-10-CM | POA: Diagnosis not present

## 2019-05-14 DIAGNOSIS — I110 Hypertensive heart disease with heart failure: Secondary | ICD-10-CM | POA: Diagnosis present

## 2019-05-14 DIAGNOSIS — Z888 Allergy status to other drugs, medicaments and biological substances status: Secondary | ICD-10-CM | POA: Diagnosis not present

## 2019-05-14 DIAGNOSIS — Z91040 Latex allergy status: Secondary | ICD-10-CM

## 2019-05-14 DIAGNOSIS — Z20822 Contact with and (suspected) exposure to covid-19: Secondary | ICD-10-CM | POA: Diagnosis present

## 2019-05-14 DIAGNOSIS — I491 Atrial premature depolarization: Secondary | ICD-10-CM | POA: Diagnosis present

## 2019-05-14 DIAGNOSIS — E782 Mixed hyperlipidemia: Secondary | ICD-10-CM | POA: Diagnosis not present

## 2019-05-14 DIAGNOSIS — E538 Deficiency of other specified B group vitamins: Secondary | ICD-10-CM | POA: Diagnosis not present

## 2019-05-14 DIAGNOSIS — Z7982 Long term (current) use of aspirin: Secondary | ICD-10-CM | POA: Diagnosis not present

## 2019-05-14 DIAGNOSIS — E1169 Type 2 diabetes mellitus with other specified complication: Secondary | ICD-10-CM

## 2019-05-14 DIAGNOSIS — K219 Gastro-esophageal reflux disease without esophagitis: Secondary | ICD-10-CM | POA: Diagnosis not present

## 2019-05-14 DIAGNOSIS — Z79899 Other long term (current) drug therapy: Secondary | ICD-10-CM

## 2019-05-14 DIAGNOSIS — Z8249 Family history of ischemic heart disease and other diseases of the circulatory system: Secondary | ICD-10-CM | POA: Diagnosis not present

## 2019-05-14 DIAGNOSIS — Z9181 History of falling: Secondary | ICD-10-CM | POA: Diagnosis not present

## 2019-05-14 DIAGNOSIS — E785 Hyperlipidemia, unspecified: Secondary | ICD-10-CM | POA: Diagnosis not present

## 2019-05-14 DIAGNOSIS — Z7984 Long term (current) use of oral hypoglycemic drugs: Secondary | ICD-10-CM | POA: Diagnosis not present

## 2019-05-14 DIAGNOSIS — Z923 Personal history of irradiation: Secondary | ICD-10-CM

## 2019-05-14 DIAGNOSIS — Z803 Family history of malignant neoplasm of breast: Secondary | ICD-10-CM | POA: Diagnosis not present

## 2019-05-14 DIAGNOSIS — E119 Type 2 diabetes mellitus without complications: Secondary | ICD-10-CM | POA: Diagnosis not present

## 2019-05-14 DIAGNOSIS — R262 Difficulty in walking, not elsewhere classified: Secondary | ICD-10-CM

## 2019-05-14 DIAGNOSIS — R519 Headache, unspecified: Secondary | ICD-10-CM | POA: Diagnosis not present

## 2019-05-14 LAB — BASIC METABOLIC PANEL
Anion gap: 13 (ref 5–15)
BUN: 13 mg/dL (ref 8–23)
CO2: 23 mmol/L (ref 22–32)
Calcium: 9.2 mg/dL (ref 8.9–10.3)
Chloride: 104 mmol/L (ref 98–111)
Creatinine, Ser: 0.9 mg/dL (ref 0.44–1.00)
GFR calc Af Amer: >60 mL/min (ref >60)
GFR calc non Af Amer: 57 mL/min — ABNORMAL LOW (ref >60)
Glucose, Bld: 109 mg/dL — ABNORMAL HIGH (ref 70–99)
Potassium: 4.1 mmol/L (ref 3.5–5.1)
Sodium: 140 mmol/L (ref 135–145)

## 2019-05-14 LAB — HEPATIC FUNCTION PANEL
ALT: 16 U/L (ref 0–44)
AST: 22 U/L (ref 15–41)
Albumin: 3.4 g/dL — ABNORMAL LOW (ref 3.5–5.0)
Alkaline Phosphatase: 30 U/L — ABNORMAL LOW (ref 38–126)
Bilirubin, Direct: 0.3 mg/dL — ABNORMAL HIGH (ref 0.0–0.2)
Indirect Bilirubin: 0.2 mg/dL — ABNORMAL LOW (ref 0.3–0.9)
Total Bilirubin: 0.5 mg/dL (ref 0.3–1.2)
Total Protein: 5.8 g/dL — ABNORMAL LOW (ref 6.5–8.1)

## 2019-05-14 LAB — CBC WITH DIFFERENTIAL/PLATELET
Abs Immature Granulocytes: 0.02 10*3/uL (ref 0.00–0.07)
Basophils Absolute: 0 10*3/uL (ref 0.0–0.1)
Basophils Relative: 0 %
Eosinophils Absolute: 0.1 10*3/uL (ref 0.0–0.5)
Eosinophils Relative: 1 %
HCT: 36.6 % (ref 36.0–46.0)
Hemoglobin: 11.9 g/dL — ABNORMAL LOW (ref 12.0–15.0)
Immature Granulocytes: 0 %
Lymphocytes Relative: 24 %
Lymphs Abs: 1.6 10*3/uL (ref 0.7–4.0)
MCH: 32.1 pg (ref 26.0–34.0)
MCHC: 32.5 g/dL (ref 30.0–36.0)
MCV: 98.7 fL (ref 80.0–100.0)
Monocytes Absolute: 0.5 10*3/uL (ref 0.1–1.0)
Monocytes Relative: 8 %
Neutro Abs: 4.4 10*3/uL (ref 1.7–7.7)
Neutrophils Relative %: 67 %
Platelets: 132 10*3/uL — ABNORMAL LOW (ref 150–400)
RBC: 3.71 MIL/uL — ABNORMAL LOW (ref 3.87–5.11)
RDW: 12.8 % (ref 11.5–15.5)
WBC: 6.6 10*3/uL (ref 4.0–10.5)
nRBC: 0 % (ref 0.0–0.2)

## 2019-05-14 LAB — TROPONIN I (HIGH SENSITIVITY)
Troponin I (High Sensitivity): 6 ng/L (ref <18)
Troponin I (High Sensitivity): 7 ng/L (ref <18)

## 2019-05-14 LAB — URINALYSIS, ROUTINE W REFLEX MICROSCOPIC
Bilirubin Urine: NEGATIVE
Glucose, UA: NEGATIVE mg/dL
Hgb urine dipstick: NEGATIVE
Ketones, ur: NEGATIVE mg/dL
Nitrite: NEGATIVE
Protein, ur: NEGATIVE mg/dL
Specific Gravity, Urine: 1.006 (ref 1.005–1.030)
pH: 6 (ref 5.0–8.0)

## 2019-05-14 LAB — CBG MONITORING, ED: Glucose-Capillary: 158 mg/dL — ABNORMAL HIGH (ref 70–99)

## 2019-05-14 LAB — BRAIN NATRIURETIC PEPTIDE: B Natriuretic Peptide: 208.2 pg/mL — ABNORMAL HIGH (ref 0.0–100.0)

## 2019-05-14 MED ORDER — FUROSEMIDE 10 MG/ML IJ SOLN
20.0000 mg | Freq: Once | INTRAMUSCULAR | Status: AC
  Administered 2019-05-14: 20 mg via INTRAVENOUS
  Filled 2019-05-14: qty 2

## 2019-05-14 MED ORDER — INSULIN ASPART 100 UNIT/ML ~~LOC~~ SOLN: 0.0000 [IU] | Freq: Every day | SUBCUTANEOUS | Status: DC

## 2019-05-14 MED ORDER — LOSARTAN POTASSIUM 50 MG PO TABS
50.0000 mg | ORAL_TABLET | Freq: Once | ORAL | Status: AC
  Administered 2019-05-14: 50 mg via ORAL
  Filled 2019-05-14: qty 1

## 2019-05-14 MED ORDER — NICARDIPINE HCL IN NACL 20-0.86 MG/200ML-% IV SOLN
3.0000 mg/h | INTRAVENOUS | Status: DC
  Administered 2019-05-14: 5 mg/h via INTRAVENOUS
  Filled 2019-05-14: qty 200

## 2019-05-14 MED ORDER — SODIUM CHLORIDE 0.9 % IV BOLUS
500.0000 mL | Freq: Once | INTRAVENOUS | Status: AC
  Administered 2019-05-14: 500 mL via INTRAVENOUS

## 2019-05-14 MED ORDER — HYDRALAZINE HCL 20 MG/ML IJ SOLN: 10.0000 mg | Freq: Once | INTRAMUSCULAR | Status: DC

## 2019-05-14 MED ORDER — INSULIN ASPART 100 UNIT/ML ~~LOC~~ SOLN
0.0000 [IU] | SUBCUTANEOUS | Status: DC
  Administered 2019-05-14: 2 [IU] via SUBCUTANEOUS

## 2019-05-14 MED ORDER — INSULIN ASPART 100 UNIT/ML ~~LOC~~ SOLN
0.0000 [IU] | Freq: Three times a day (TID) | SUBCUTANEOUS | Status: DC
  Administered 2019-05-15 (×2): 2 [IU] via SUBCUTANEOUS
  Administered 2019-05-15: 1 [IU] via SUBCUTANEOUS
  Administered 2019-05-16: 2 [IU] via SUBCUTANEOUS
  Administered 2019-05-16: 1 [IU] via SUBCUTANEOUS

## 2019-05-14 MED ORDER — HYDRALAZINE HCL 20 MG/ML IJ SOLN: 10.0000 mg | INTRAMUSCULAR | Status: DC | PRN

## 2019-05-14 NOTE — ED Triage Notes (Signed)
Pt here for evaluation of hypertension x 1 week, highest reading 206/92. Pt's PCP advised her to come here. Compliant with medications, recent addition of Losartan in addition to metoprolol and flecainide she was already taking. Pt endorses headache but no chest pain.

## 2019-05-14 NOTE — ED Provider Notes (Signed)
[facility] EMERGENCY DEPARTMENT Provider Note   CSN: [id number] Arrival date & time: 05/14/19  1414     History Chief Complaint  Patient presents with  . Hypertension    Tami Jacobson is a 86 y.o. female with a history of hypertension presenting to the emergency department with high blood pressure and headache and blurred vision.  She reports that she began having a headache last week and had episode of blurred vision.  She checked her blood pressure and it was 206/92 at home.  She is not sure what her baseline blood pressure is, she only checks it sporadically, but does report that she has never had a systolic pressure of 765.  She had been taking metoprolol and flecainide as prescribed.  Her PCP also started her on Cozaar 50 mg once daily, which he has been taking for the past 6 days.  However she reports consistent intermittent headaches.  Also has intermittent blurred vision.  Her PCP told her to come into the emergency department today for evaluation.  She denies any chest pain or shortness of breath.  She denies any flank pain.  She currently reports mild headache which is throbbing and frontally located.  She denies any nausea vomiting photophobia or neck stiffness.  She denies any blurred vision at the current moment, but says she is been having intermittent blurred vision throughout the day.Marland Kitchen  HPI     Past Medical History:  Diagnosis Date  . Arthritis    left hip  . Breast cancer, right ([place]) 11/2016  . Family history of breast cancer   . Family history of colon cancer   . Family history of pancreatic cancer   . GERD (gastroesophageal reflux disease)   . Non-insulin dependent type 2 diabetes mellitus ([place])   . PAC (premature atrial contraction)   . Personal history of radiation therapy 12/2016  . Premature ventricular contraction   . Seasonal allergies   . Sensitive skin   . Vitamin B12 deficiency     Patient Active Problem List   Diagnosis Date  Noted  . Chronic diastolic CHF (congestive heart failure) ([place]) 05/14/2019  . Hypertensive urgency 05/14/2019  . Mild tricuspid regurgitation 05/13/2019  . LVH (left ventricular hypertrophy), mild 05/13/2019  . Memory difficulty 12/05/2018  . Genetic testing 08/30/2017  . Depression 06/01/2017  . Family history of breast cancer   . Family history of colon cancer   . Family history of pancreatic cancer   . Malignant neoplasm of lower-inner quadrant of right breast of female, estrogen receptor positive ([place]) 10/28/2016  . Abnormal electrocardiogram 05/05/2015  . BPPV (benign paroxysmal positional vertigo) 10/01/2014  . Murmur 04/18/2014  . Anal fissure 12/16/2011  . Fibrocystic breast changes. Left.  Biopsy 07/02/2011. 07/14/2011  . History of breast cancer, Right, T1c, N0, lumpectomy 06/26/2008.   Left, T1c, N0, lumpectomy 06/24/2006. 05/12/2011  . Diabetes mellitus type 2, controlled ([place]) 06/11/2009  . PREMATURE VENTRICULAR CONTRACTIONS 08/19/2008  . B12 deficiency 08/29/2007  . ESOPHAGEAL STRICTURE 04/27/2007  . GERD (gastroesophageal reflux disease) 04/27/2007  . DIVERTICULOSIS, COLON 04/27/2007  . OTHER ALOPECIA 02/14/2007  . HYPERLIPIDEMIA 01/02/2007  . Essential hypertension 01/02/2007  . Carotid stenosis 08/22/2006    Past Surgical History:  Procedure Laterality Date  . ABDOMINAL HYSTERECTOMY  1979   partial  . BREAST BIOPSY  07/02/2011   Procedure: BREAST BIOPSY WITH NEEDLE LOCALIZATION;  Surgeon: Shann Medal, MD;  Location: [facility];  Service: General;  Laterality: Left;  left  breast atypical hyperplasia needle localization biopsy  . BREAST BIOPSY Left 06/13/2006  . BREAST BIOPSY Right 06/27/2008  . BREAST BIOPSY Right 09/27/2016  . BREAST EXCISIONAL BIOPSY Left 07/02/2011  . BREAST LUMPECTOMY Left 07/04/2006  . BREAST LUMPECTOMY Right 06/26/2008  . BREAST LUMPECTOMY Right 09/27/2016  . BREAST LUMPECTOMY WITH RADIOACTIVE SEED LOCALIZATION Right 11/18/2016   Procedure:  RIGHT BREAST LUMPECTOMY WITH RADIOACTIVE SEED LOCALIZATION ERAS PATHWAY;  Surgeon: Alphonsa Overall, MD;  Location: [facility];  Service: General;  Laterality: Right;  ERAS PATHWAY  . CATARACT EXTRACTION Bilateral   . CHOLECYSTECTOMY, LAPAROSCOPIC  07/07/2001   had appendex taking out at the same time  . ESOPHAGEAL DILATION  2006  . LAPAROSCOPIC APPENDECTOMY  07/07/2001  . TONSILLECTOMY  1942     OB History   No obstetric history on file.     Family History  Problem Relation Age of Onset  . Colon cancer Mother 93  . Heart attack Mother   . Breast cancer Sister 20  . Heart disease Maternal Uncle        CABG  . Breast cancer Sister 83  . Heart disease Brother   . Other Father        suicide  . Breast cancer Sister 75  . Heart disease Sister   . COPD Brother   . Heart disease Brother   . Breast cancer Other 54       niece - brother's daughter  . Pancreatic cancer Other        brother's son, dx in his 57s    Social History   Tobacco Use  . Smoking status: Never Smoker  . Smokeless tobacco: Never Used  Substance Use Topics  . Alcohol use: No    Alcohol/week: 0.0 standard drinks  . Drug use: No    Home Medications Prior to Admission medications   Medication Sig Start Date End Date Taking? Authorizing Provider  aspirin EC 81 MG tablet Take 81 mg by mouth as needed (for headaches).   Yes [provider]  clobetasol cream (TEMOVATE) 0.30 % Apply 1 application topically as needed (for skin irritation- to affected areas).  06/21/11  Yes [provider]  cyanocobalamin (,VITAMIN B-12,) 1000 MCG/ML injection Inject 1,000 mcg into the muscle every 30 (thirty) days.    Yes [provider]  diphenhydramine-acetaminophen (TYLENOL PM) 25-500 MG TABS tablet Take 1 tablet by mouth at bedtime as needed (for sleep).   Yes [provider]  flecainide (TAMBOCOR) 50 MG tablet TAKE 1 TABLET BY MOUTH TWO  TIMES DAILY Patient taking  differently: Take 50 mg by mouth 2 (two) times daily.  08/02/18  Yes Lelon Perla, MD  losartan (COZAAR) 50 MG tablet Take 1 tablet (50 mg total) by mouth daily. 05/08/19  Yes Burns, Claudina Lick, MD  metFORMIN (GLUCOPHAGE-XR) 500 MG 24 hr tablet TAKE 1 TABLET BY MOUTH ONCE DAILY WITH BREAKFAST Patient taking differently: Take 500 mg by mouth daily with breakfast.  04/02/19  Yes Burns, Claudina Lick, MD  metoprolol succinate (TOPROL-XL) 50 MG 24 hr tablet TAKE 1 TABLET BY MOUTH  DAILY Patient taking differently: Take 50 mg by mouth daily.  05/10/19  Yes Lelon Perla, MD  naproxen sodium (ANAPROX) 220 MG tablet Take 220 mg by mouth daily as needed (for pain or headaches).    Yes [provider]  omeprazole (PRILOSEC) 20 MG capsule Take 1 capsule (20 mg total) by mouth daily. Please schedule an Office Visit for  any further refills.  Thank you Patient taking differently: Take 20 mg by mouth daily before breakfast.  05/08/19  Yes Esterwood, Amy S, PA-C  tamoxifen (NOLVADEX) 20 MG tablet TAKE 1 TABLET BY MOUTH  DAILY Patient taking differently: Take 20 mg by mouth daily.  01/04/19  Yes Magrinat, Virgie Dad, MD  ACCU-CHEK GUIDE test strip USE AS DIRECTED 01/22/19   Binnie Rail, MD  blood glucose meter kit and supplies KIT Dispense based on patient and insurance preference. Check sugars once daily and as needed as directed. 09/07/16   Binnie Rail, MD    Allergies    Statins, Effexor [venlafaxine], Cephalexin, Codeine, Lansoprazole, Latex, Levofloxacin, Nitrofurantoin, and Rofecoxib  Review of Systems   Review of Systems  Constitutional: Negative for chills and fever.  Eyes: Positive for visual disturbance. Negative for photophobia and redness.  Respiratory: Negative for cough and shortness of breath.   Cardiovascular: Negative for chest pain and palpitations.  Gastrointestinal: Negative for abdominal pain, nausea and vomiting.  Genitourinary: Negative for dysuria and hematuria.  Musculoskeletal:  Negative for neck pain and neck stiffness.  Skin: Negative for color change and rash.  Neurological: Positive for dizziness and headaches. Negative for seizures, syncope, facial asymmetry, speech difficulty, weakness and numbness.  Psychiatric/Behavioral: Negative for agitation and confusion.  All other systems reviewed and are negative.   Physical Exam Updated Vital Signs BP (!) 156/52 (BP Location: Left Arm)   Pulse (!) 57   Temp 98.1 F (36.7 C) (Oral)   Resp 16   Ht '5\' 3"'  (1.6 m)   Wt 60.4 kg   SpO2 93%   BMI 23.59 kg/m   Physical Exam Vitals and nursing note reviewed.  Constitutional:      General: She is not in acute distress.    Appearance: She is well-developed.  HENT:     Head: Normocephalic and atraumatic.  Eyes:     Extraocular Movements: Extraocular movements intact.     Conjunctiva/sclera: Conjunctivae normal.     Pupils: Pupils are equal, round, and reactive to light.  Cardiovascular:     Rate and Rhythm: Normal rate and regular rhythm.     Pulses: Normal pulses.  Pulmonary:     Effort: Pulmonary effort is normal. No respiratory distress.  Abdominal:     Palpations: Abdomen is soft.  Musculoskeletal:     Cervical back: Neck supple.  Skin:    General: Skin is warm and dry.  Neurological:     Mental Status: She is alert.  Psychiatric:        Mood and Affect: Mood normal.        Behavior: Behavior normal.     ED Results / Procedures / Treatments   Labs (all labs ordered are listed, but only abnormal results are displayed) Labs Reviewed  BASIC METABOLIC PANEL - Abnormal; Notable for the following components:      Result Value   Glucose, Bld 109 (*)    GFR calc non Af Amer 57 (*)    All other components within normal limits  URINALYSIS, ROUTINE W REFLEX MICROSCOPIC - Abnormal; Notable for the following components:   Leukocytes,Ua TRACE (*)    Bacteria, UA RARE (*)    All other components within normal limits  CBC WITH DIFFERENTIAL/PLATELET -  Abnormal; Notable for the following components:   RBC 3.71 (*)    Hemoglobin 11.9 (*)    Platelets 132 (*)    All other components within normal limits  BRAIN NATRIURETIC PEPTIDE -  Abnormal; Notable for the following components:   B Natriuretic Peptide 208.2 (*)    All other components within normal limits  HEPATIC FUNCTION PANEL - Abnormal; Notable for the following components:   Total Protein 5.8 (*)    Albumin 3.4 (*)    Alkaline Phosphatase 30 (*)    Bilirubin, Direct 0.3 (*)    Indirect Bilirubin 0.2 (*)    All other components within normal limits  COMPREHENSIVE METABOLIC PANEL - Abnormal; Notable for the following components:   Potassium 3.4 (*)    Glucose, Bld 147 (*)    Total Protein 5.8 (*)    Albumin 3.4 (*)    Alkaline Phosphatase 32 (*)    GFR calc non Af Amer 54 (*)    All other components within normal limits  CBC - Abnormal; Notable for the following components:   RBC 3.48 (*)    Hemoglobin 11.4 (*)    HCT 33.5 (*)    Platelets 129 (*)    All other components within normal limits  GLUCOSE, CAPILLARY - Abnormal; Notable for the following components:   Glucose-Capillary 160 (*)    All other components within normal limits  GLUCOSE, CAPILLARY - Abnormal; Notable for the following components:   Glucose-Capillary 138 (*)    All other components within normal limits  GLUCOSE, CAPILLARY - Abnormal; Notable for the following components:   Glucose-Capillary 191 (*)    All other components within normal limits  CBG MONITORING, ED - Abnormal; Notable for the following components:   Glucose-Capillary 158 (*)    All other components within normal limits  SARS CORONAVIRUS 2 (TAT 6-24 HRS)  MAGNESIUM  PHOSPHORUS  TSH  HEMOGLOBIN A1C  TROPONIN I (HIGH SENSITIVITY)  TROPONIN I (HIGH SENSITIVITY)    EKG EKG Interpretation  Date/Time:  Tuesday May 15 2019 06:20:05 EST Ventricular Rate:  61 PR Interval:  190 QRS Duration: 96 QT Interval:  470 QTC  Calculation: 473 R Axis:   -32 Text Interpretation: Sinus rhythm with Premature atrial complexes Left axis deviation Inferior infarct , age undetermined Abnormal ECG overal similar to previous Confirmed by Theotis Burrow [phone]) on 05/15/2019 9:30:42 AM   Radiology CT Head Wo Contrast  Result Date: 05/14/2019 CLINICAL DATA:  86 year old female with headache. EXAM: CT HEAD WITHOUT CONTRAST TECHNIQUE: Contiguous axial images were obtained from the base of the skull through the vertex without intravenous contrast. COMPARISON:  Head CT dated 12/24/2009. FINDINGS: Brain: There is moderate age-related atrophy and chronic microvascular ischemic changes. There is no acute intracranial hemorrhage. No mass effect or midline shift. No extra-axial fluid collection. Vascular: No hyperdense vessel or unexpected calcification. Skull: Normal. Negative for fracture or focal lesion. Sinuses/Orbits: No acute finding. Other: None IMPRESSION: 1. No acute intracranial hemorrhage. 2. Age-related atrophy and chronic microvascular ischemic changes. Electronically Signed   By: Anner Crete M.D.   On: 05/14/2019 19:30   DG CHEST PORT 1 VIEW  Result Date: 05/14/2019 CLINICAL DATA:  Hypertension EXAM: PORTABLE CHEST 1 VIEW COMPARISON:  02/20/2015 FINDINGS: Heart and mediastinal contours are within normal limits. No focal opacities or effusions. No acute bony abnormality. IMPRESSION: No active disease. Electronically Signed   By: Rolm Baptise M.D.   On: 05/14/2019 22:53    Procedures .Critical Care Performed by: Wyvonnia Dusky, MD Authorized by: Wyvonnia Dusky, MD   Critical care provider statement:    Critical care time (minutes):  52   Critical care was necessary to treat or prevent imminent or life-threatening  deterioration of the following conditions:  Circulatory failure   Critical care was time spent personally by me on the following activities:  Discussions with consultants, evaluation of patient's response to  treatment, examination of patient, ordering and performing treatments and interventions, ordering and review of laboratory studies, ordering and review of radiographic studies, pulse oximetry, re-evaluation of patient's condition, obtaining history from patient or surrogate and review of old charts Comments:     Hypertensive emergency requiring IV blood pressure medications   (including critical care time)  Medications Ordered in ED Medications  hydrALAZINE (APRESOLINE) injection 10 mg (has no administration in time range)  insulin aspart (novoLOG) injection 0-5 Units (0 Units Subcutaneous Not Given 05/15/19 0035)  insulin aspart (novoLOG) injection 0-9 Units (2 Units Subcutaneous Given 05/15/19 1135)  aspirin EC tablet 81 mg (has no administration in time range)  tamoxifen (NOLVADEX) tablet 20 mg (20 mg Oral Given 05/15/19 0852)  flecainide (TAMBOCOR) tablet 50 mg (50 mg Oral Given 05/15/19 0853)  pantoprazole (PROTONIX) EC tablet 40 mg (40 mg Oral Given 05/15/19 0852)  acetaminophen (TYLENOL) tablet 650 mg (650 mg Oral Given 05/15/19 1000)    Or  acetaminophen (TYLENOL) suppository 650 mg ( Rectal See Alternative 05/15/19 1000)  HYDROcodone-acetaminophen (NORCO/VICODIN) 5-325 MG per tablet 1-2 tablet (has no administration in time range)  ondansetron (ZOFRAN) tablet 4 mg (has no administration in time range)    Or  ondansetron (ZOFRAN) injection 4 mg (has no administration in time range)  enoxaparin (LOVENOX) injection 30 mg (has no administration in time range)  sodium chloride flush (NS) 0.9 % injection 3 mL (3 mLs Intravenous Given 05/15/19 0854)  sodium chloride flush (NS) 0.9 % injection 3 mL (has no administration in time range)  0.9 %  sodium chloride infusion (has no administration in time range)  losartan (COZAAR) tablet 100 mg (100 mg Oral Given 05/15/19 0851)  metoprolol succinate (TOPROL-XL) 24 hr tablet 50 mg (50 mg Oral Given 05/15/19 0852)  losartan (COZAAR) tablet 50 mg (50 mg Oral Given  05/14/19 1753)  sodium chloride 0.9 % bolus 500 mL (0 mLs Intravenous Stopped 05/14/19 2110)  furosemide (LASIX) injection 20 mg (20 mg Intravenous Given 05/14/19 2119)  potassium chloride SA (KLOR-CON) CR tablet 40 mEq (40 mEq Oral Given 05/15/19 7253)    ED Course  I have reviewed the triage vital signs and the nursing notes.  Pertinent labs & imaging results that were available during my care of the patient were reviewed by me and considered in my medical decision making (see chart for details).  86 yo female presenting to Ed with hypertensive urgency, reporting fluctuating blood pressures this week spiking near SBP 220 mmhg.  Also reporting intermittent headaches all week, and episodes of blurred vision with this.  Currently has mild headache but no visual deficits.  No chest pain.  Plan for hypertensive emergency evaluation including trop, ecg, CT head, BMP.  Will give additional 50 mc cozaar here (she took morning meds already) and re-evaluate her headache if her BP improves. Currently SBP 180 mm hg.  Low suspicion for stroke or aortic dissection based on her current clinical presentation.    Clinical Course as of May 14 1142  Mon May 14, 2019  6644 Patient now reporting blurred vision and lightheadedness with BP elevation (after getting PO medications).  Will initiate cardene infusion for goal MAP reduction ~25% in first hour and anticipate admission after workup complete in ED   [MT]  1950 Spoke to intensivist, they  will come evaluate her    [MT]  2051 BP improved on cardene, she still has headache and blurred vision, awaiting ICU evaluation   [MT]  2107 Critical care attg and NP report they believe patient apparently has chronically elevated BP in range of 180 mmhg, and that this is not a hypertensive emergency.  They recommended stopping cardene and switching to hydralazine push doses and lasix as needed.   [MT]  2250 Admitted to hospitalist   [MT]    Clinical Course User  Index [MT] Nahome Bublitz, Carola Rhine, MD   Final Clinical Impression(s) / ED Diagnoses Final diagnoses:  Hypertensive urgency    Rx / DC Orders ED Discharge Orders    None       Wyvonnia Dusky, MD 05/15/19 1145

## 2019-05-14 NOTE — Telephone Encounter (Signed)
New message    Pt c/o BP issue: STAT if pt c/o blurred vision, one-sided weakness or slurred speech  1. What are your BP readings? Patient saying she has a new monitor  Yesterday  @ 9:30am - 184/81 @ 5:00pm 182/84  This morning 179/88 old machine   @ 9:15am 206/92 - new machine    2. Are you having any other symptoms (ex. Dizziness, headache, blurred vision, passed out)?  Dizziness, and headache for a week   3. What is your BP issue? Discuss with the nurse

## 2019-05-14 NOTE — Telephone Encounter (Signed)
Advised pt of PCP response. Expressed understanding. Will call a friend to take her to the ED.

## 2019-05-14 NOTE — Telephone Encounter (Signed)
If BP is that high and she is having dizziness and headache she needs immediate attention and needs to go to the ED

## 2019-05-14 NOTE — ED Notes (Signed)
PT was being assisted to get into wheelchair to use the bathroom.  She requested to sit on the side of the bed because she was too dizzy.  After several minutes, she was able to be transferred to wheelchair.

## 2019-05-14 NOTE — H&P (Addendum)
Tami Jacobson ZGY:[id number] DOB: 1927/12/28 DOA: 05/14/2019     PCP: Binnie Rail, MD   Outpatient Specialists:  CARDS:  Dr. Stanford Breed    Patient arrived to ER on 05/14/19 at 1414  Patient coming from: home Lives alone,        Chief Complaint:   Chief Complaint  Patient presents with  . Hypertension    HPI: Tami Jacobson is a 86 y.o. female with medical history significant of HTN, history of breast cancer in, GERD, DM 2  Presented with 1 week history of elevated blood pressure headaches blurred vision   Last week was seen for elevated BP and headache with blurred vision added losartan 50 mg a day and continue metoprolol 50-day recommended for her to go to hospital.  Today patient reported blood pressure elevated in 184/81 up to 206/92 still blurred vision and feeling lightheaded she called her primary care provider who instructed her to present to emergency department.   Patient denied any chest pain Reports she was taking her metoprolol and flecainide as prescribed she has been taking her Cozaar for the past 6 days. 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    chronic CHF diastolic  - last echo 11 May 2019 showing grade 1 diastolic dysfunction  History of breast cancer status post radiation therapy currently on tamoxifen   DM 2 -  Lab Results  Component Value Date   HGBA1C 6.8 (H) 02/22/2019   on PO meds only,    While in ER: Found to have hypertensive urgency with fluctuating blood pressures CT head unremarkable Troponin unremarkable no evidence of AKI Initially started on Cardene drip was able to weaned off in the ER Was able to transition to as needed  hydralazine Now bp went down to 152/54 Patient was seen and evaluated by PCCM who recommended weaning off Cardene drip And admitting to stepdown    The following Work up has been ordered so far:  Orders Placed This Encounter  Procedures  . SARS CORONAVIRUS 2 (TAT 6-24 HRS) Nasopharyngeal Nasopharyngeal Swab  . CT Head Wo Contrast  . Basic metabolic panel  . Urinalysis, Routine w reflex microscopic  . CBC with Differential  . Hemoglobin A1c  . STAT CBG when hypoglycemia is suspected. If treated, recheck every 15 minutes after each treatment until CBG >/= 70 mg/dl  . Refer to Hypoglycemia Protocol Sidebar Report for treatment of CBG < 70 mg/dl  . No basal insulin at this time  . Inpatient consult to Cardiology  ALL PATIENTS BEING ADMITTED/HAVING PROCEDURES NEED COVID-19 SCREENING  . Consult to intensivist  ALL PATIENTS BEING ADMITTED/HAVING PROCEDURES NEED COVID-19 SCREENING  . Consult to hospitalist  ALL PATIENTS BEING ADMITTED/HAVING PROCEDURES NEED COVID-19 SCREENING  . EKG 12-Lead   Following Medications were ordered in ER: Medications  hydrALAZINE (APRESOLINE) injection 10 mg (has no administration in time range)  insulin aspart (novoLOG) injection 0-9 Units (has no administration in time range)  losartan (COZAAR) tablet 50 mg (50 mg Oral Given 05/14/19 1753)  sodium chloride 0.9 % bolus 500 mL (0 mLs Intravenous Stopped 05/14/19 2110)  furosemide (  LASIX) injection 20 mg (20 mg Intravenous Given 05/14/19 2119)        Consult Orders  (From admission, onward)         Start     Ordered   05/14/19 2201  Consult to hospitalist  ALL PATIENTS BEING ADMITTED/HAVING PROCEDURES NEED COVID-19 SCREENING PAGED [facility]  Once    Comments: ALL PATIENTS BEING ADMITTED/HAVING PROCEDURES NEED COVID-19 SCREENING  Provider:  (Not yet assigned)  Question Answer Comment  Place call to: [facility]   Reason for Consult Admit      05/14/19 2200          Significant initial   Findings: Abnormal Labs Reviewed  BASIC METABOLIC PANEL - Abnormal; Notable for the following components:      Result Value   Glucose, Bld 109 (*)    GFR calc non Af Amer 57 (*)    All other components within normal limits  URINALYSIS, ROUTINE W REFLEX MICROSCOPIC - Abnormal; Notable for the following components:   Leukocytes,Ua TRACE (*)    Bacteria, UA RARE (*)    All other components within normal limits  CBC WITH DIFFERENTIAL/PLATELET - Abnormal; Notable for the following components:   RBC 3.71 (*)    Hemoglobin 11.9 (*)    Platelets 132 (*)    All other components within normal limits     Otherwise labs showing:    Recent Labs  Lab 05/14/19 1750  NA 140  K 4.1  CO2 23  GLUCOSE 109*  BUN 13  CREATININE 0.90  CALCIUM 9.2    Cr stable,   Lab Results  Component Value Date   CREATININE 0.90 05/14/2019   CREATININE 1.24 (H) 02/22/2019   CREATININE 1.17 08/23/2018    No results for input(s): AST, ALT, ALKPHOS, BILITOT, PROT, ALBUMIN in the last 168 hours. Lab Results  Component Value Date   CALCIUM 9.2 05/14/2019      WBC        Component Value Date/Time   WBC 6.6 05/14/2019 1750   ANC    Component Value Date/Time   NEUTROABS 4.4 05/14/2019 1750   NEUTROABS 4.5 02/28/2017 1453   ALC No components found for: LYMPHAB    Plt: Lab Results  Component Value Date   PLT 132 (L) 05/14/2019    Lactic Acid, Venous No results found for: LATICACIDVEN     COVID-19 Labs  No results for input(s): DDIMER, FERRITIN, LDH, CRP in the last 72 hours.  Lab Results  Component Value Date   [place] Not Detected 02/08/2019    HG/HCT  Stable,     Component Value Date/Time   HGB 11.9 (L) 05/14/2019 1750   HGB 12.6 02/28/2017 1453   HCT 36.6 05/14/2019 1750   HCT 38.5 02/28/2017 1453     Troponin  6-7     ECG: Ordered Personally reviewed by me showing: HR : 61 Rhythm:  NSR,   no evidence of ischemic changes QTC 451   DM  labs:  HbA1C: Recent  Labs    08/23/18 1151 02/22/19 1204  HGBA1C 7.1* 6.8*       CBG (last 3)  Recent Labs    05/14/19 2220  GLUCAP 158*     UA   no evidence of UTI     Urine analysis:    Component Value Date/Time   COLORURINE YELLOW 05/14/2019 1852   APPEARANCEUR CLEAR 05/14/2019 1852   LABSPEC 1.006 05/14/2019 1852   PHURINE 6.0 05/14/2019 1852   GLUCOSEU NEGATIVE 05/14/2019  [place] 05/14/2019 [place] 05/14/2019 [place] 05/14/2019 1852   PROTEINUR NEGATIVE 05/14/2019 1852   NITRITE NEGATIVE 05/14/2019 1852   LEUKOCYTESUR TRACE (A) 05/14/2019 1852       Ordered  CT HEAD   NON acute  CXR -  NON acute    ED Triage Vitals  Enc Vitals Group     BP 05/14/19 1421 (!) 181/66     Pulse Rate 05/14/19 1421 71     Resp 05/14/19 1421 17     Temp 05/14/19 1421 98.4 F (36.9 C)     Temp Source 05/14/19 1421 Oral     SpO2 05/14/19 1421 96 %     Weight --      Height --      Head Circumference --      Peak Flow --      Pain Score 05/14/19 1434 4     Pain Loc --      Pain Edu? --      Excl. in [place]? --   TMAX(24)@       Latest  Blood pressure (!) 140/52, pulse 62, temperature 98.4 F (36.9 C), temperature source Oral, resp. rate 14, SpO2 98 %.    Hospitalist was called for admission for hypertensive urgency   Review of Systems:    Pertinent positives include: blurred vision, headache  Constitutional:  No weight loss, night sweats, Fevers, chills, fatigue, weight loss  HEENT:  No headaches, Difficulty swallowing,Tooth/dental problems,Sore throat,  No sneezing, itching, ear ache, nasal congestion, post nasal drip,  Cardio-vascular:  No chest pain, Orthopnea, PND, anasarca, dizziness, palpitations.no Bilateral lower extremity swelling  GI:  No heartburn, indigestion, abdominal pain, nausea, vomiting, diarrhea, change in bowel habits, loss of appetite, melena, blood in stool, hematemesis Resp:  no shortness of breath at rest. No  dyspnea on exertion, No excess mucus, no productive cough, No non-productive cough, No coughing up of blood.No change in color of mucus.No wheezing. Skin:  no rash or lesions. No jaundice GU:  no dysuria, change in color of urine, no urgency or frequency. No straining to urinate.  No flank pain.  Musculoskeletal:  No joint pain or no joint swelling. No decreased range of motion. No back pain.  Psych:  No change in mood or affect. No depression or anxiety. No memory loss.  Neuro: no localizing neurological complaints, no tingling, no weakness, no double vision, no gait abnormality, no slurred speech, no confusion  All systems reviewed and apart from [place] all are negative  Past Medical History:   Past Medical History:  Diagnosis Date  . Arthritis    left hip  . Breast cancer, right ([place]) 11/2016  . Family history of breast cancer   . Family history of colon cancer   . Family history of pancreatic cancer   . GERD (gastroesophageal reflux disease)   . Non-insulin dependent type 2 diabetes mellitus ([place])   . PAC (premature atrial contraction)   . Personal history of radiation therapy 12/2016  . Premature ventricular contraction   . Seasonal allergies   . Sensitive skin   . Vitamin B12 deficiency       Past Surgical History:  Procedure Laterality Date  . ABDOMINAL HYSTERECTOMY  1979   partial  . BREAST BIOPSY  07/02/2011   Procedure: BREAST BIOPSY WITH NEEDLE LOCALIZATION;  Surgeon: Shann Medal, MD;  Location: [facility];  Service: General;  Laterality: Left;  left breast  atypical hyperplasia needle localization biopsy  . BREAST BIOPSY Left 06/13/2006  . BREAST BIOPSY Right 06/27/2008  . BREAST BIOPSY Right 09/27/2016  . BREAST EXCISIONAL BIOPSY Left 07/02/2011  . BREAST LUMPECTOMY Left 07/04/2006  . BREAST LUMPECTOMY Right 06/26/2008  . BREAST LUMPECTOMY Right 09/27/2016  . BREAST LUMPECTOMY WITH RADIOACTIVE SEED LOCALIZATION Right 11/18/2016   Procedure: RIGHT BREAST  LUMPECTOMY WITH RADIOACTIVE SEED LOCALIZATION ERAS PATHWAY;  Surgeon: Alphonsa Overall, MD;  Location: [facility];  Service: General;  Laterality: Right;  ERAS PATHWAY  . CATARACT EXTRACTION Bilateral   . CHOLECYSTECTOMY, LAPAROSCOPIC  07/07/2001   had appendex taking out at the same time  . ESOPHAGEAL DILATION  2006  . LAPAROSCOPIC APPENDECTOMY  07/07/2001  . TONSILLECTOMY  1942    Social History:  Ambulatory  independently       reports that she has never smoked. She has never used smokeless tobacco. She reports that she does not drink alcohol or use drugs.   Family History:   Family History  Problem Relation Age of Onset  . Colon cancer Mother 88  . Heart attack Mother   . Breast cancer Sister 49  . Heart disease Maternal Uncle        CABG  . Breast cancer Sister 80  . Heart disease Brother   . Other Father        suicide  . Breast cancer Sister 40  . Heart disease Sister   . COPD Brother   . Heart disease Brother   . Breast cancer Other 13       niece - brother's daughter  . Pancreatic cancer Other        brother's son, dx in his 67s    Allergies: Allergies  Allergen Reactions  . Statins Other (See Comments)    GI UPSET  . Effexor [Venlafaxine]     Constipation, shaking, dizzy  . Cephalexin Other (See Comments)    Reaction not recalled by the patient  . Codeine Other (See Comments)    Reaction not recalled by the patient  . Lansoprazole Other (See Comments)    Reaction not recalled by the patient  . Latex Rash       . Levofloxacin Other (See Comments)    Reaction not recalled by the patient  . Nitrofurantoin Other (See Comments)    Reaction not recalled by the patient  . Rofecoxib Other (See Comments)    Reaction not recalled by the patient      Prior to Admission medications   Medication Sig Start Date End Date Taking? Authorizing Provider  aspirin EC 81 MG tablet Take 81 mg by mouth as needed (for headaches).   Yes [provider]  clobetasol cream (TEMOVATE) 9.02 % Apply 1 application topically as needed (for skin irritation- to affected areas).  06/21/11  Yes [provider]  cyanocobalamin (,VITAMIN B-12,) 1000 MCG/ML injection Inject 1,000 mcg into the muscle every 30 (thirty) days.    Yes [provider]  diphenhydramine-acetaminophen (TYLENOL PM) 25-500 MG TABS tablet Take 1 tablet by mouth at bedtime as needed (for sleep).   Yes [provider]  flecainide (TAMBOCOR) 50 MG tablet TAKE 1 TABLET BY MOUTH TWO  TIMES DAILY Patient taking differently: Take 50 mg by mouth 2 (two) times daily.  08/02/18  Yes Lelon Perla, MD  losartan (COZAAR) 50 MG tablet Take 1 tablet (50 mg total) by mouth daily. 05/08/19  Yes Burns, Claudina Lick, MD  metFORMIN [facility])  500 MG 24 hr tablet TAKE 1 TABLET BY MOUTH ONCE DAILY WITH BREAKFAST Patient taking differently: Take 500 mg by mouth daily with breakfast.  04/02/19  Yes Burns, Claudina Lick, MD  metoprolol succinate (TOPROL-XL) 50 MG 24 hr tablet TAKE 1 TABLET BY MOUTH  DAILY Patient taking differently: Take 50 mg by mouth daily.  05/10/19  Yes Lelon Perla, MD  naproxen sodium (ANAPROX) 220 MG tablet Take 220 mg by mouth daily as needed (for pain or headaches).    Yes [provider]  omeprazole (PRILOSEC) 20 MG capsule Take 1 capsule (20 mg total) by mouth daily. Please schedule an Office Visit for any further refills.  Thank you Patient taking differently: Take 20 mg by mouth daily before breakfast.  05/08/19  Yes Esterwood, Amy S, PA-C  tamoxifen (NOLVADEX) 20 MG tablet TAKE 1 TABLET BY MOUTH  DAILY Patient taking differently: Take 20 mg by mouth daily.  01/04/19  Yes Magrinat, Virgie Dad, MD  ACCU-CHEK GUIDE test strip USE AS DIRECTED 01/22/19   Binnie Rail, MD  blood glucose meter kit and supplies KIT Dispense based on patient and insurance preference. Check sugars once daily and as needed as directed. 09/07/16   Binnie Rail, MD    Physical Exam: Blood pressure (!) 140/52, pulse 62, temperature 98.4 F (36.9 C), temperature source Oral, resp. rate 14, SpO2 98 %. 1. General:  in No  Acute distress   Chronically ill  -appearing 2. Psychological: Alert and   Oriented 3. Head/ENT:    Dry Mucous Membranes                          Head Non traumatic, neck supple                            Poor Dentition 4. SKIN:  decreased Skin turgor,  Skin clean Dry and intact no rash 5. Heart: Regular rate and rhythm no  Murmur, no Rub or gallop 6. Lungs:  no wheezes or crackles   7. Abdomen: Soft,  non-tender, Non distended  bowel sounds present 8. Lower extremities: no clubbing, cyanosis, no edema 9. Neurologically Grossly intact, moving all 4 extremities equally  10. MSK: Normal range of motion   All other LABS:     Recent Labs  Lab 05/14/19 1750  WBC 6.6  NEUTROABS 4.4  HGB 11.9*  HCT 36.6  MCV 98.7  PLT 132*     Recent Labs  Lab 05/14/19 1750  NA 140  K 4.1  CL 104  CO2 23  GLUCOSE 109*  BUN 13  CREATININE 0.90  CALCIUM 9.2     No results for input(s): AST, ALT, ALKPHOS, BILITOT, PROT, ALBUMIN in the last 168 hours.     Cultures: No results found for: SDES, SPECREQUEST, CULT, REPTSTATUS   Radiological Exams on Admission: CT Head Wo Contrast  Result Date: 05/14/2019 CLINICAL DATA:  86 year old female with headache. EXAM: CT HEAD WITHOUT CONTRAST TECHNIQUE: Contiguous axial images were obtained from the base of the skull through the vertex without intravenous contrast. COMPARISON:  Head CT dated 12/24/2009. FINDINGS: Brain: There is moderate age-related atrophy and chronic microvascular ischemic changes. There is no acute intracranial hemorrhage. No mass effect or midline shift. No extra-axial fluid collection. Vascular: No hyperdense vessel or unexpected calcification. Skull: Normal. Negative for fracture or focal lesion. Sinuses/Orbits: No acute finding. Other: None IMPRESSION: 1. No acute  intracranial  hemorrhage. 2. Age-related atrophy and chronic microvascular ischemic changes. Electronically Signed   By: Anner Crete M.D.   On: 05/14/2019 19:30    Chart has been reviewed    Assessment/Plan  86 y.o. female with medical history significant of HTN, history of breast cancer in, GERD, DM 2 Admitted for hypertensive urgency  Present on Admission: . Hypertensive urgency -BP has been stable in ER Off of nicardipine drip for now.  On required 30 minutes on the lower setting Will observe overnight Hydralazine as needed.    Will need further follow-up with cardiology and primary care provider regarding adjustment of home medications  . HYPERLIPIDEMIA -chronic stable continue home medications  . Essential hypertension -continue Toprol and Cozaar  . GERD (gastroesophageal reflux disease) chronic stable continue home medications  . Chronic diastolic CHF (congestive heart failure) (HCC) received a dose of Lasix in the emergency department continue to follow fluid status  History of breast cancer continue home medications currently stable   History of frequent PVCs -continue flecainide and Toprol and watching telemetry  DM2-  - Order Sensitive SSI   -  check TSH and HgA1C  - Hold by mouth medications     Other plan as per orders.  DVT prophylaxis:    Lovenox     Code Status:  FULL CODE   as per patient  I had personally discussed CODE STATUS with patient    Family Communication:   Family not at  Bedside    Disposition Plan:     To home once workup is complete and patient is stable                      Would benefit from PT/OT eval prior to DC  Ordered                   Consults called: PCCM, email cardiology   Admission status:  ED Disposition    None      Obs      Level of care       Tele  please discontinue once patient no longer qualifies   Precautions: admitted as  asymptomatic screening protocol  No active isolations     PPE: Used by the provider:    P100  eye Goggles,  Gloves     Jillayne Witte 05/14/2019, 12:23 AM    [facility]     after 2 AM please page floor coverage PA If 7AM-7PM, please contact the day team taking care of the patient using [email or web address]   Patient was evaluated in the context of the global COVID-19 pandemic, which necessitated consideration that the patient might be at risk for infection with the SARS-CoV-2 virus that causes COVID-19. Institutional protocols and algorithms that pertain to the evaluation of patients at risk for COVID-19 are in a state of rapid change based on information released by regulatory bodies including the CDC and federal and state organizations. These policies and algorithms were followed during the patient's care.

## 2019-05-14 NOTE — Consult Note (Signed)
NAME:  Tami Jacobson, MRN:  [id number], DOB:  01-11-1928, LOS: 0 ADMISSION DATE:  05/14/2019, CONSULTATION DATE:  05/14/19 REFERRING MD:  Langston Masker - EM, CHIEF COMPLAINT:  HTN Emergency  Brief History   86 yo F with SBP 206, HA, dizziness, blurred vision. Started on cardene gtt in ED   History of present illness   86 yo F PMH HTN, DM2, breast cancer,  who presented to ED at referral of PCP for BP 206/92 at home. Patient notes associated sx throbbing HA, which slowly began 1 week PTA and 1x episode of blurry vision. Denied chest pain, SOB, flank pain. In ED, patient started on cardene gtt for HTN urgency.   ED labs are grossly WNL; without sign on AKI or elevated troponin. CT H performed in ED, without acute intracranial findings   PCCM consulted for admission.   Past Medical History  HTN DM2 Breast Cancer PVC/PAC B 12 deficiency   Significant Hospital Events   05/14/19 admitted for HTN urgency   Consults:    Procedures:    Significant Diagnostic Tests:  2/8 CT Head> no acute ICH. Age-related atrophy and chronic microvascular changes   Micro Data:  2/8 SARS Cov2>   Antimicrobials:    Interim history/subjective:  Weaning cardene gtt in ED   Objective   Blood pressure (!) 152/54, pulse 67, temperature 98.4 F (36.9 C), temperature source Oral, resp. rate 14, SpO2 100 %.       No intake or output data in the 24 hours ending 05/14/19 2015 There were no vitals filed for this visit.  Examination: General: wdwn elderly female, reclined in stretcher NAD HENT: NCAT pink mmm, trachea midline anicteric sclera Lungs: CTA no adventitious sounds. Symmetrical chest expansion. No accessory muscle use  Cardiovascular: RRR s1s2 no rgm Cap refill < 3 seconds Abdomen: soft round ndnt + bowel sounds  Extremities: Symmetrical bulk and tone, no obvious joint deformity. No clubbing or cyanosis   Neuro: AAO x4 following commands PERRLA 85m GU: defer Skin: c/d/w/i without rash    Resolved Hospital Problem list     Assessment & Plan:   HTN Urgency -BP 206/92 initially  -home meds: cozaar 552mqD P -started on cardene gtt; has been progressively weaned in ED  -Recommend transitioning to PRN hydral + diuresis and discontinuation of cardene gtt  -SBP goal 150-170 overnight, with MAP reduction no more than 25% from presenting MAP.  -Anticipate ok for admission to SDU with hospitalist  -tele monitoring -Trend renal function, UOP   PAC/PVCs -followed by Dr. CrStanford BreedToprol XL 5064mD, flecainide 89m14mD  P -continue flecainide and beta blocker   DM P -SSI    Thank you for consulting PCCM. We will plan to sign off at this time. If the patient does not tolerate cessation of cardene gtt and requires ICU intervention please reengage PCCM.   Best practice:  Diet: Heart healthy  Pain/Anxiety/Delirium protocol (if indicated): na VAP protocol (if indicated): na DVT prophylaxis: SQ Heparin GI prophylaxis: na Glucose control: SSI  Mobility: assist  Code Status: Full  Family Communication: pending Disposition: Plan to dc continuous vasoactive medications in ED and admission to SDU with TRH.   Labs   CBC: Recent Labs  Lab 05/14/19 1750  WBC 6.6  NEUTROABS 4.4  HGB 11.9*  HCT 36.6  MCV 98.7  PLT 132*    Basic Metabolic Panel: Recent Labs  Lab 05/14/19 1750  NA 140  K 4.1  CL 104  CO2 23  GLUCOSE 109*  BUN 13  CREATININE 0.90  CALCIUM 9.2   GFR: Estimated Creatinine Clearance: 35.1 mL/min (by C-G formula based on SCr of 0.9 mg/dL). Recent Labs  Lab 05/14/19 1750  WBC 6.6    Liver Function Tests: No results for input(s): AST, ALT, ALKPHOS, BILITOT, PROT, ALBUMIN in the last 168 hours. No results for input(s): LIPASE, AMYLASE in the last 168 hours. No results for input(s): AMMONIA in the last 168 hours.  ABG No results found for: PHART, PCO2ART, PO2ART, HCO3, TCO2, ACIDBASEDEF, O2SAT   Coagulation Profile: No results for  input(s): INR, PROTIME in the last 168 hours.  Cardiac Enzymes: No results for input(s): CKTOTAL, CKMB, CKMBINDEX, TROPONINI in the last 168 hours.  HbA1C: Hgb A1c MFr Bld  Date/Time Value Ref Range Status  02/22/2019 12:04 PM 6.8 (H) 4.6 - 6.5 % Final    Comment:    Glycemic Control Guidelines for People with Diabetes:Non Diabetic:  <6%Goal of Therapy: <7%Additional Action Suggested:  >8%   08/23/2018 11:51 AM 7.1 (H) 4.6 - 6.5 % Final    Comment:    Glycemic Control Guidelines for People with Diabetes:Non Diabetic:  <6%Goal of Therapy: <7%Additional Action Suggested:  >8%     CBG: No results for input(s): GLUCAP in the last 168 hours.  Review of Systems:   Endorses dizziness, HA, blurry vision  Denies chest pain, palpitations, DOE  Denies SOB, wheezing coughing runny nose Denies nvdc Denies hair skin nail changes  Denies painful urination, changes in urinary frequency, blood in urine  Denies fever chills unintentional weight loss   Past Medical History  She,  has a past medical history of Arthritis, Breast cancer, right ([place]) (11/2016), Family history of breast cancer, Family history of colon cancer, Family history of pancreatic cancer, GERD (gastroesophageal reflux disease), Non-insulin dependent type 2 diabetes mellitus ([place]), PAC (premature atrial contraction), Personal history of radiation therapy (12/2016), Premature ventricular contraction, Seasonal allergies, Sensitive skin, and Vitamin B12 deficiency.   Surgical History    Past Surgical History:  Procedure Laterality Date  . ABDOMINAL HYSTERECTOMY  1979   partial  . BREAST BIOPSY  07/02/2011   Procedure: BREAST BIOPSY WITH NEEDLE LOCALIZATION;  Surgeon: Shann Medal, MD;  Location: [facility];  Service: General;  Laterality: Left;  left breast atypical hyperplasia needle localization biopsy  . BREAST BIOPSY Left 06/13/2006  . BREAST BIOPSY Right 06/27/2008  . BREAST BIOPSY Right 09/27/2016  . BREAST EXCISIONAL BIOPSY  Left 07/02/2011  . BREAST LUMPECTOMY Left 07/04/2006  . BREAST LUMPECTOMY Right 06/26/2008  . BREAST LUMPECTOMY Right 09/27/2016  . BREAST LUMPECTOMY WITH RADIOACTIVE SEED LOCALIZATION Right 11/18/2016   Procedure: RIGHT BREAST LUMPECTOMY WITH RADIOACTIVE SEED LOCALIZATION ERAS PATHWAY;  Surgeon: Alphonsa Overall, MD;  Location: [facility];  Service: General;  Laterality: Right;  ERAS PATHWAY  . CATARACT EXTRACTION Bilateral   . CHOLECYSTECTOMY, LAPAROSCOPIC  07/07/2001   had appendex taking out at the same time  . ESOPHAGEAL DILATION  2006  . LAPAROSCOPIC APPENDECTOMY  07/07/2001  . TONSILLECTOMY  1942     Social History   reports that she has never smoked. She has never used smokeless tobacco. She reports that she does not drink alcohol or use drugs.   Family History   Her family history includes Breast cancer (age of onset: 59) in an other family member; Breast cancer (age of onset: 36) in her sister; Breast cancer (age of onset: 50) in her sister; Breast cancer (age of onset: 65)  in her sister; COPD in her brother; Colon cancer (age of onset: 21) in her mother; Heart attack in her mother; Heart disease in her brother, brother, maternal uncle, and sister; Other in her father; Pancreatic cancer in an other family member.   Allergies Allergies  Allergen Reactions  . Statins Other (See Comments)    GI UPSET  . Effexor [Venlafaxine]     Constipation, shaking, dizzy  . Cephalexin Other (See Comments)    Reaction not recalled by the patient  . Codeine Other (See Comments)    Reaction not recalled by the patient  . Lansoprazole Other (See Comments)    Reaction not recalled by the patient  . Latex Rash       . Levofloxacin Other (See Comments)    Reaction not recalled by the patient  . Nitrofurantoin Other (See Comments)    Reaction not recalled by the patient  . Rofecoxib Other (See Comments)    Reaction not recalled by the patient      Home Medications  Prior to  Admission medications   Medication Sig Start Date End Date Taking? Authorizing Provider  aspirin EC 81 MG tablet Take 81 mg by mouth as needed (for headaches).   Yes [provider]  clobetasol cream (TEMOVATE) 3.11 % Apply 1 application topically as needed (for skin irritation- to affected areas).  06/21/11  Yes [provider]  cyanocobalamin (,VITAMIN B-12,) 1000 MCG/ML injection Inject 1,000 mcg into the muscle every 30 (thirty) days.    Yes [provider]  diphenhydramine-acetaminophen (TYLENOL PM) 25-500 MG TABS tablet Take 1 tablet by mouth at bedtime as needed (for sleep).   Yes [provider]  flecainide (TAMBOCOR) 50 MG tablet TAKE 1 TABLET BY MOUTH TWO  TIMES DAILY Patient taking differently: Take 50 mg by mouth 2 (two) times daily.  08/02/18  Yes Lelon Perla, MD  losartan (COZAAR) 50 MG tablet Take 1 tablet (50 mg total) by mouth daily. 05/08/19  Yes Burns, Claudina Lick, MD  metFORMIN (GLUCOPHAGE-XR) 500 MG 24 hr tablet TAKE 1 TABLET BY MOUTH ONCE DAILY WITH BREAKFAST Patient taking differently: Take 500 mg by mouth daily with breakfast.  04/02/19  Yes Burns, Claudina Lick, MD  metoprolol succinate (TOPROL-XL) 50 MG 24 hr tablet TAKE 1 TABLET BY MOUTH  DAILY Patient taking differently: Take 50 mg by mouth daily.  05/10/19  Yes Lelon Perla, MD  naproxen sodium (ANAPROX) 220 MG tablet Take 220 mg by mouth daily as needed (for pain or headaches).    Yes [provider]  omeprazole (PRILOSEC) 20 MG capsule Take 1 capsule (20 mg total) by mouth daily. Please schedule an Office Visit for any further refills.  Thank you Patient taking differently: Take 20 mg by mouth daily before breakfast.  05/08/19  Yes Esterwood, Amy S, PA-C  tamoxifen (NOLVADEX) 20 MG tablet TAKE 1 TABLET BY MOUTH  DAILY Patient taking differently: Take 20 mg by mouth daily.  01/04/19  Yes Magrinat, Virgie Dad, MD  ACCU-CHEK GUIDE test strip USE AS DIRECTED 01/22/19   Binnie Rail, MD   blood glucose meter kit and supplies KIT Dispense based on patient and insurance preference. Check sugars once daily and as needed as directed. 09/07/16   Binnie Rail, MD      Eliseo Gum MSN, AGACNP-BC [place] [phone] If no answer, [phone] 05/14/2019, 9:26 PM

## 2019-05-15 ENCOUNTER — Encounter (HOSPITAL_COMMUNITY): Payer: Self-pay | Admitting: Internal Medicine

## 2019-05-15 ENCOUNTER — Other Ambulatory Visit: Payer: Self-pay

## 2019-05-15 DIAGNOSIS — I110 Hypertensive heart disease with heart failure: Secondary | ICD-10-CM | POA: Diagnosis present

## 2019-05-15 DIAGNOSIS — Z79899 Other long term (current) drug therapy: Secondary | ICD-10-CM | POA: Diagnosis not present

## 2019-05-15 DIAGNOSIS — E785 Hyperlipidemia, unspecified: Secondary | ICD-10-CM | POA: Diagnosis present

## 2019-05-15 DIAGNOSIS — K219 Gastro-esophageal reflux disease without esophagitis: Secondary | ICD-10-CM | POA: Diagnosis present

## 2019-05-15 DIAGNOSIS — Z803 Family history of malignant neoplasm of breast: Secondary | ICD-10-CM | POA: Diagnosis not present

## 2019-05-15 DIAGNOSIS — E119 Type 2 diabetes mellitus without complications: Secondary | ICD-10-CM | POA: Diagnosis present

## 2019-05-15 DIAGNOSIS — Z885 Allergy status to narcotic agent status: Secondary | ICD-10-CM | POA: Diagnosis not present

## 2019-05-15 DIAGNOSIS — Z888 Allergy status to other drugs, medicaments and biological substances status: Secondary | ICD-10-CM | POA: Diagnosis not present

## 2019-05-15 DIAGNOSIS — Z91040 Latex allergy status: Secondary | ICD-10-CM | POA: Diagnosis not present

## 2019-05-15 DIAGNOSIS — E538 Deficiency of other specified B group vitamins: Secondary | ICD-10-CM | POA: Diagnosis present

## 2019-05-15 DIAGNOSIS — Z20822 Contact with and (suspected) exposure to covid-19: Secondary | ICD-10-CM | POA: Diagnosis present

## 2019-05-15 DIAGNOSIS — M1612 Unilateral primary osteoarthritis, left hip: Secondary | ICD-10-CM | POA: Diagnosis present

## 2019-05-15 DIAGNOSIS — I10 Essential (primary) hypertension: Secondary | ICD-10-CM | POA: Diagnosis not present

## 2019-05-15 DIAGNOSIS — Z7982 Long term (current) use of aspirin: Secondary | ICD-10-CM | POA: Diagnosis not present

## 2019-05-15 DIAGNOSIS — I493 Ventricular premature depolarization: Secondary | ICD-10-CM | POA: Diagnosis present

## 2019-05-15 DIAGNOSIS — Z7984 Long term (current) use of oral hypoglycemic drugs: Secondary | ICD-10-CM | POA: Diagnosis not present

## 2019-05-15 DIAGNOSIS — I160 Hypertensive urgency: Secondary | ICD-10-CM | POA: Diagnosis present

## 2019-05-15 DIAGNOSIS — Z8249 Family history of ischemic heart disease and other diseases of the circulatory system: Secondary | ICD-10-CM | POA: Diagnosis not present

## 2019-05-15 DIAGNOSIS — Z9181 History of falling: Secondary | ICD-10-CM | POA: Diagnosis not present

## 2019-05-15 DIAGNOSIS — Z923 Personal history of irradiation: Secondary | ICD-10-CM | POA: Diagnosis not present

## 2019-05-15 DIAGNOSIS — Z853 Personal history of malignant neoplasm of breast: Secondary | ICD-10-CM | POA: Diagnosis not present

## 2019-05-15 DIAGNOSIS — I491 Atrial premature depolarization: Secondary | ICD-10-CM | POA: Diagnosis present

## 2019-05-15 DIAGNOSIS — I5032 Chronic diastolic (congestive) heart failure: Secondary | ICD-10-CM | POA: Diagnosis present

## 2019-05-15 LAB — CBC
HCT: 33.5 % — ABNORMAL LOW (ref 36.0–46.0)
Hemoglobin: 11.4 g/dL — ABNORMAL LOW (ref 12.0–15.0)
MCH: 32.8 pg (ref 26.0–34.0)
MCHC: 34 g/dL (ref 30.0–36.0)
MCV: 96.3 fL (ref 80.0–100.0)
Platelets: 129 10*3/uL — ABNORMAL LOW (ref 150–400)
RBC: 3.48 MIL/uL — ABNORMAL LOW (ref 3.87–5.11)
RDW: 12.9 % (ref 11.5–15.5)
WBC: 6 10*3/uL (ref 4.0–10.5)
nRBC: 0 % (ref 0.0–0.2)

## 2019-05-15 LAB — COMPREHENSIVE METABOLIC PANEL
ALT: 15 U/L (ref 0–44)
AST: 16 U/L (ref 15–41)
Albumin: 3.4 g/dL — ABNORMAL LOW (ref 3.5–5.0)
Alkaline Phosphatase: 32 U/L — ABNORMAL LOW (ref 38–126)
Anion gap: 10 (ref 5–15)
BUN: 12 mg/dL (ref 8–23)
CO2: 29 mmol/L (ref 22–32)
Calcium: 9.4 mg/dL (ref 8.9–10.3)
Chloride: 101 mmol/L (ref 98–111)
Creatinine, Ser: 0.93 mg/dL (ref 0.44–1.00)
GFR calc Af Amer: >60 mL/min (ref >60)
GFR calc non Af Amer: 54 mL/min — ABNORMAL LOW (ref >60)
Glucose, Bld: 147 mg/dL — ABNORMAL HIGH (ref 70–99)
Potassium: 3.4 mmol/L — ABNORMAL LOW (ref 3.5–5.1)
Sodium: 140 mmol/L (ref 135–145)
Total Bilirubin: 0.5 mg/dL (ref 0.3–1.2)
Total Protein: 5.8 g/dL — ABNORMAL LOW (ref 6.5–8.1)

## 2019-05-15 LAB — GLUCOSE, CAPILLARY
Glucose-Capillary: 160 mg/dL — ABNORMAL HIGH (ref 70–99)
Glucose-Capillary: 138 mg/dL — ABNORMAL HIGH (ref 70–99)
Glucose-Capillary: 191 mg/dL — ABNORMAL HIGH (ref 70–99)
Glucose-Capillary: 146 mg/dL — ABNORMAL HIGH (ref 70–99)
Glucose-Capillary: 165 mg/dL — ABNORMAL HIGH (ref 70–99)

## 2019-05-15 LAB — TSH: TSH: 2.519 u[IU]/mL (ref 0.350–4.500)

## 2019-05-15 LAB — PHOSPHORUS: Phosphorus: 3.7 mg/dL (ref 2.5–4.6)

## 2019-05-15 LAB — HEMOGLOBIN A1C
Hgb A1c MFr Bld: 6.7 % — ABNORMAL HIGH (ref 4.8–5.6)
Mean Plasma Glucose: 146 mg/dL

## 2019-05-15 LAB — MAGNESIUM: Magnesium: 1.7 mg/dL (ref 1.7–2.4)

## 2019-05-15 LAB — SARS CORONAVIRUS 2 (TAT 6-24 HRS): SARS Coronavirus 2: NEGATIVE

## 2019-05-15 MED ORDER — METOPROLOL SUCCINATE ER 50 MG PO TB24: 50.0000 mg | ORAL_TABLET | Freq: Every day | ORAL | Status: DC

## 2019-05-15 MED ORDER — HYDRALAZINE HCL 20 MG/ML IJ SOLN: 10.0000 mg | INTRAMUSCULAR | Status: DC | PRN

## 2019-05-15 MED ORDER — SODIUM CHLORIDE 0.9% FLUSH: 3.0000 mL | INTRAVENOUS | Status: DC | PRN

## 2019-05-15 MED ORDER — POTASSIUM CHLORIDE CRYS ER 20 MEQ PO TBCR
40.0000 meq | EXTENDED_RELEASE_TABLET | Freq: Once | ORAL | Status: AC
  Administered 2019-05-15: 40 meq via ORAL
  Filled 2019-05-15: qty 2

## 2019-05-15 MED ORDER — ONDANSETRON HCL 4 MG/2ML IJ SOLN: 4.0000 mg | Freq: Four times a day (QID) | INTRAMUSCULAR | Status: DC | PRN

## 2019-05-15 MED ORDER — LOSARTAN POTASSIUM 50 MG PO TABS
100.0000 mg | ORAL_TABLET | Freq: Every day | ORAL | Status: DC
  Administered 2019-05-15 – 2019-05-16 (×2): 100 mg via ORAL
  Filled 2019-05-15 (×2): qty 2

## 2019-05-15 MED ORDER — METOPROLOL SUCCINATE ER 50 MG PO TB24
50.0000 mg | ORAL_TABLET | Freq: Every day | ORAL | Status: DC
  Administered 2019-05-15 – 2019-05-16 (×2): 50 mg via ORAL
  Filled 2019-05-15 (×2): qty 1

## 2019-05-15 MED ORDER — ONDANSETRON HCL 4 MG PO TABS: 4.0000 mg | ORAL_TABLET | Freq: Four times a day (QID) | ORAL | Status: DC | PRN

## 2019-05-15 MED ORDER — TAMOXIFEN CITRATE 10 MG PO TABS
20.0000 mg | ORAL_TABLET | Freq: Every day | ORAL | Status: DC
  Administered 2019-05-15 – 2019-05-16 (×2): 20 mg via ORAL
  Filled 2019-05-15 (×2): qty 2

## 2019-05-15 MED ORDER — HYDROCODONE-ACETAMINOPHEN 5-325 MG PO TABS: 1.0000 | ORAL_TABLET | ORAL | Status: DC | PRN

## 2019-05-15 MED ORDER — PANTOPRAZOLE SODIUM 40 MG PO TBEC
40.0000 mg | DELAYED_RELEASE_TABLET | Freq: Every day | ORAL | Status: DC
  Administered 2019-05-15 – 2019-05-16 (×2): 40 mg via ORAL
  Filled 2019-05-15 (×2): qty 1

## 2019-05-15 MED ORDER — SODIUM CHLORIDE 0.9% FLUSH
3.0000 mL | Freq: Two times a day (BID) | INTRAVENOUS | Status: DC
  Administered 2019-05-15 – 2019-05-16 (×4): 3 mL via INTRAVENOUS

## 2019-05-15 MED ORDER — FLECAINIDE ACETATE 50 MG PO TABS
50.0000 mg | ORAL_TABLET | Freq: Two times a day (BID) | ORAL | Status: DC
  Administered 2019-05-15 – 2019-05-16 (×4): 50 mg via ORAL
  Filled 2019-05-15 (×5): qty 1

## 2019-05-15 MED ORDER — ENOXAPARIN SODIUM 30 MG/0.3ML ~~LOC~~ SOLN: 30.0000 mg | SUBCUTANEOUS | Status: DC

## 2019-05-15 MED ORDER — ACETAMINOPHEN 650 MG RE SUPP: 650.0000 mg | Freq: Four times a day (QID) | RECTAL | Status: DC | PRN

## 2019-05-15 MED ORDER — ACETAMINOPHEN 325 MG PO TABS
650.0000 mg | ORAL_TABLET | Freq: Four times a day (QID) | ORAL | Status: DC | PRN
  Administered 2019-05-15: 650 mg via ORAL
  Filled 2019-05-15: qty 2

## 2019-05-15 MED ORDER — ENOXAPARIN SODIUM 40 MG/0.4ML ~~LOC~~ SOLN
40.0000 mg | SUBCUTANEOUS | Status: DC
  Administered 2019-05-15: 40 mg via SUBCUTANEOUS
  Filled 2019-05-15: qty 0.4

## 2019-05-15 MED ORDER — LOSARTAN POTASSIUM 50 MG PO TABS: 50.0000 mg | ORAL_TABLET | Freq: Every day | ORAL | Status: DC

## 2019-05-15 MED ORDER — SODIUM CHLORIDE 0.9 % IV SOLN: 250.0000 mL | INTRAVENOUS | Status: DC | PRN

## 2019-05-15 MED ORDER — ASPIRIN EC 81 MG PO TBEC: 81.0000 mg | DELAYED_RELEASE_TABLET | ORAL | Status: DC | PRN

## 2019-05-15 NOTE — Evaluation (Signed)
Physical Therapy Evaluation Patient Details Name: Tami Jacobson MRN: [id number] DOB: Nov 12, 1927 Today's Date: 05/15/2019   History of Present Illness  Pt is a 86 yo female admitted with headache, blurry vision and high BP.  Pt with h/o breast CA, HTN.  Clinical Impression  Pt was seen for mobility and strength check and primarily are noting her BP to be high today (189/57) and feeling dizzy with all sitting and standing activity.  Her information about help at home is that she does not have family, but has friends who may be able to assist her.  Nursing is asking for pt to stay on another day to get BP resolved and see if this is her reason for the instability.  Pt has a need for RW as she only has rollator at home, and will have this ordered as well.  Follow acutely for strengthening, to work on awareness of safety and to increase her control of gait on the most appropriate AD as her symptoms hopefully resolve.      Follow Up Recommendations Home health PT;Supervision for mobility/OOB    Equipment Recommendations  Rolling walker with 5" wheels    Recommendations for Other Services       Precautions / Restrictions Precautions Precautions: Fall Precaution Comments: monitor BP Restrictions Weight Bearing Restrictions: No      Mobility  Bed Mobility Overal bed mobility: Modified Independent             General bed mobility comments: up in chair when PT arrived  Transfers Overall transfer level: Needs assistance Equipment used: Rolling walker (2 wheeled);1 person hand held assist Transfers: Sit to/from Stand Sit to Stand: Min assist Stand pivot transfers: Min guard       General transfer comment: can power up but loses balance in the transition  Ambulation/Gait Ambulation/Gait assistance: Min guard;Min assist Gait Distance (Feet): 80 Feet Assistive device: Rolling walker (2 wheeled);1 person hand held assist Gait Pattern/deviations: Step-through pattern;Decreased  stride length;Wide base of support Gait velocity: reduced Gait velocity interpretation: <1.31 ft/sec, indicative of household ambulator General Gait Details: pt is struggling wiht walker but is not familiar with it and not sure about how to maneuver it  Stairs            Wheelchair Mobility    Modified Rankin (Stroke Patients Only)       Balance Overall balance assessment: Needs assistance Sitting-balance support: Feet supported Sitting balance-Leahy Scale: Good     Standing balance support: Bilateral upper extremity supported;During functional activity Standing balance-Leahy Scale: Fair Standing balance comment: less than fair dynamic standing                             Pertinent Vitals/Pain Pain Assessment: Faces Faces Pain Scale: No hurt Pain Location: head Pain Descriptors / Indicators: Aching Pain Intervention(s): Monitored during session;Repositioned    Home Living Family/patient expects to be discharged to:: Private residence Living Arrangements: Alone Available Help at Discharge: Friend(s);Available PRN/intermittently Type of Home: House Home Access: Level entry     Home Layout: One level Home Equipment: Walker - 4 wheels;Cane - single point Additional Comments: needs a regular RW    Prior Function Level of Independence: Independent         Comments: able to care for herself at home, driving     Hand Dominance   Dominant Hand: Right    Extremity/Trunk Assessment   Upper Extremity Assessment Upper Extremity Assessment: Overall  WFL for tasks assessed    Lower Extremity Assessment Lower Extremity Assessment: Overall WFL for tasks assessed    Cervical / Trunk Assessment Cervical / Trunk Assessment: Kyphotic  Communication   Communication: No difficulties  Cognition Arousal/Alertness: Awake/alert Behavior During Therapy: WFL for tasks assessed/performed Overall Cognitive Status: Within Functional Limits for tasks assessed                                  General Comments: extra time to       General Comments General comments (skin integrity, edema, etc.): main concern is dizziness but is also having high BP readings    Exercises     Assessment/Plan    PT Assessment Patient needs continued PT services  PT Problem List Decreased strength;Decreased range of motion;Decreased activity tolerance;Decreased balance;Decreased mobility;Decreased coordination;Decreased knowledge of use of DME;Decreased safety awareness;Cardiopulmonary status limiting activity       PT Treatment Interventions DME instruction;Gait training;Functional mobility training;Therapeutic activities;Therapeutic exercise;Neuromuscular re-education;Balance training;Patient/family education    PT Goals (Current goals can be found in the Care Plan section)  Acute Rehab PT Goals Patient Stated Goal: to be able to get home safely PT Goal Formulation: With patient Time For Goal Achievement: 05/29/19 Potential to Achieve Goals: Good    Frequency Min 3X/week   Barriers to discharge Decreased caregiver support home alone with no family who can stay    Co-evaluation               AM-PAC PT "6 Clicks" Mobility  Outcome Measure Help needed turning from your back to your side while in a flat bed without using bedrails?: None Help needed moving from lying on your back to sitting on the side of a flat bed without using bedrails?: None Help needed moving to and from a bed to a chair (including a wheelchair)?: A Little Help needed standing up from a chair using your arms (e.g., wheelchair or bedside chair)?: A Little Help needed to walk in hospital room?: A Little Help needed climbing 3-5 steps with a railing? : A Lot 6 Click Score: 19    End of Session Equipment Utilized During Treatment: Gait belt Activity Tolerance: Patient tolerated treatment well;Treatment limited secondary to medical complications  (Comment)(dizziness) Patient left: in chair;with call bell/phone within reach;with chair alarm set Nurse Communication: Mobility status PT Visit Diagnosis: Unsteadiness on feet (R26.81);Ataxic gait (R26.0)    Time: 1030-1058 PT Time Calculation (min) (ACUTE ONLY): 28 min   Charges:   PT Evaluation $PT Eval Moderate Complexity: 1 Mod PT Treatments $Gait Training: 8-22 mins       Ramond Dial 05/15/2019, 12:59 PM   Mee Hives, PT MS Acute Rehab Dept. Number: [place] [facility] [place]

## 2019-05-15 NOTE — TOC Initial Note (Signed)
Transition of Care [facility]) - Initial/Assessment Note    Patient Details  Name: Tami Jacobson MRN: [id number] Date of Birth: [birth year]  [facility]) CM/SW Contact:    Geralynn Ochs, LCSW Phone Number: 05/15/2019, 1:25 PM  Clinical Narrative:   CSW met with patient to discuss discharge planning needs. CSW discussed recommendation for 24 hour care at discharge, and patient does not currently have support but will call friends/neighbors to see if anyone can stay with her at discharge. CSW offered to assist, and patient refused. CSW asked about equipment, and patient would like to think about whether or not she would like a walker. CSW discussed CMS list for home health services, and patient said she would like to use [facility], if possible. CSW to contact [facility] with referral and follow back up with patient on caregiver support and choice on walker.              Expected Discharge Plan: [place] Barriers to Discharge: Continued Medical Work up, Ship broker   Patient Goals and CMS Choice Patient states their goals for this hospitalization and ongoing recovery are:: to be able to get home safely CMS Medicare.gov Compare Post Acute Care list provided to:: Patient Choice offered to / list presented to : Patient  Expected Discharge Plan and Services Expected Discharge Plan: [place] Choice: Durable Medical Equipment, Home Health Living arrangements for the past 2 months: Single Family Home                 DME Arranged: Walker rolling DME Agency: [facility]       HH Arranged: PT, OT HH Agency: [facility]        Prior Living Arrangements/Services Living arrangements for the past 2 months: Single Family Home Lives with:: Self Patient language and need for interpreter reviewed:: No Do you feel safe going back to the place where you live?: Yes      Need for Family  Participation in Patient Care: No (Comment) Care giver support system in place?: No (comment)   Criminal Activity/Legal Involvement Pertinent to Current Situation/Hospitalization: No - Comment as needed  Activities of Daily Living Home Assistive Devices/Equipment: Eyeglasses ADL Screening (condition at time of admission) Patient's cognitive ability adequate to safely complete daily activities?: Yes Is the patient deaf or have difficulty hearing?: Yes Does the patient have difficulty seeing, even when wearing glasses/contacts?: No Does the patient have difficulty concentrating, remembering, or making decisions?: No Patient able to express need for assistance with ADLs?: Yes Does the patient have difficulty dressing or bathing?: No Independently performs ADLs?: Yes (appropriate for developmental age) Does the patient have difficulty walking or climbing stairs?: Yes Weakness of Legs: Both Weakness of Arms/Hands: Both  Permission Sought/Granted                  Emotional Assessment Appearance:: Appears stated age Attitude/Demeanor/Rapport: Engaged Affect (typically observed): Pleasant Orientation: : Oriented to Self, Oriented to Place, Oriented to  Time, Oriented to Situation Alcohol / Substance Use: Not Applicable Psych Involvement: No (comment)  Admission diagnosis:  Hypertensive urgency [I16.0] Patient Active Problem List   Diagnosis Date Noted  . Chronic diastolic CHF (congestive heart failure) ([place]) 05/14/2019  . Hypertensive urgency 05/14/2019  . Mild tricuspid regurgitation 05/13/2019  . LVH (left ventricular hypertrophy), mild 05/13/2019  . Memory difficulty 12/05/2018  . Genetic testing 08/30/2017  . Depression 06/01/2017  .  Family history of breast cancer   . Family history of colon cancer   . Family history of pancreatic cancer   . Malignant neoplasm of lower-inner quadrant of right breast of female, estrogen receptor positive ([place]) 10/28/2016  . Abnormal  electrocardiogram 05/05/2015  . BPPV (benign paroxysmal positional vertigo) 10/01/2014  . Murmur 04/18/2014  . Anal fissure 12/16/2011  . Fibrocystic breast changes. Left.  Biopsy 07/02/2011. 07/14/2011  . History of breast cancer, Right, T1c, N0, lumpectomy 06/26/2008.   Left, T1c, N0, lumpectomy 06/24/2006. 05/12/2011  . Diabetes mellitus type 2, controlled ([place]) 06/11/2009  . PREMATURE VENTRICULAR CONTRACTIONS 08/19/2008  . B12 deficiency 08/29/2007  . ESOPHAGEAL STRICTURE 04/27/2007  . GERD (gastroesophageal reflux disease) 04/27/2007  . DIVERTICULOSIS, COLON 04/27/2007  . OTHER ALOPECIA 02/14/2007  . HYPERLIPIDEMIA 01/02/2007  . Essential hypertension 01/02/2007  . Carotid stenosis 08/22/2006   PCP:  Binnie Rail, MD Pharmacy:   [facility], [address] [place] [address] Phone: [phone] Fax: [place], [place] [address] Phone: [phone] Fax: [phone]     Social Determinants of Health (SDOH) Interventions    Readmission Risk Interventions No flowsheet data found.

## 2019-05-15 NOTE — Progress Notes (Signed)
PROGRESS NOTE    Tami Jacobson  [id number] DOB: Nov 01, 1927 DOA: 05/14/2019 PCP: Binnie Rail, MD   Brief Narrative:  86 year old female with history of hypertension and breast cancer and guarding type 2 diabetes mellitus presented with 1 week history of elevated blood pressure, headaches and blurred vision.  She saw her PCP last week due to blurred vision and elevated blood pressure and was added losartan to her previous regimen of metoprolol 50 mg a day.  She has been compliant with the medications.  She presented with hypertensive urgency in the emergency department.  CT head was negative.  Assessment & Plan:   Active Problems:   Diabetes mellitus type 2, controlled ([place])   HYPERLIPIDEMIA   Essential hypertension   GERD (gastroesophageal reflux disease)   History of breast cancer, Right, T1c, N0, lumpectomy 06/26/2008.   Left, T1c, N0, lumpectomy 06/24/2006.   Chronic diastolic CHF (congestive heart failure) (H[facility])   Hypertensive urgency   Hypertensive urgency/dizziness: CT head negative.  Although patient symptoms have improved but she still has some dizziness and some blurred vision mostly in the right eye.  Her blood much better.  No other complaint.  Has improved from presentation but still slightly elevated.  We will increase her losartan to 100 mg and continue current dose of Toprol-XL.  Add hydralazine as needed.  She was seen by PT OT and she is still unsteady on her feet.  They recommend home health with 24-hour supervision but unfortunately, patient lives by herself with no support at home.  She is unsafe to discharge home with a great risk of fall.  She prefers to go home as opposed to SNF if needed and wants to call her family and friends to see if someone would be willing to live with her for next few days.  She hopes to have some arrangements made by tomorrow.  Hyperlipidemia: Mild.  Not on any medication.  GERD: Continue PPI.  Chronic diastolic congestive heart  failure: Euvolemic.  Not on any diuretics.  History of breast cancer: No active treatment.  History of frequent PVCs: Continue flecainide.  Type 2 diabetes mellitus: Takes Metformin at home.  Last hemoglobin A1c 6.8.  Continue SSI.  DVT prophylaxis: Lovenox Code Status: Full code Family Communication:  None present at bedside.  Plan of care discussed with patient in length and he verbalized understanding and agreed with it. Patient is from: Home Disposition Plan: Home with home health Barriers to discharge: Needs 24-hour supervision.  Lives alone.  Still dizzy and unsteady.  High risk for fall and thus unsafe to discharge.  Patient making phone calls to make arrangements for someone to live with her and she is hopeful to make those arrangements by tomorrow.   Estimated body mass index is 23.59 kg/m as calculated from the following:   Height as of this encounter: 5\' 3"  (1.6 m).   Weight as of this encounter: 60.4 kg.      Nutritional status:               Consultants:   None  Procedures:   None  Antimicrobials:   None   Subjective: Seen and examined.  Complains of dizziness and blurriness in the right eye only.  Her blood pressure has improved somewhat.  Objective: Vitals:   05/15/19 0200 05/15/19 0220 05/15/19 0543 05/15/19 1210  BP: (!) 146/47 (!) 144/95 (!) 156/52 (!) 149/52  Pulse: (!) 58 70 (!) 57 61  Resp: 18 15 16  Temp:  98.8 F (37.1 C) 98.1 F (36.7 C) 98.7 F (37.1 C)  TempSrc:  Oral Oral Oral  SpO2: 96% 94% 93% 99%  Weight:   60.4 kg   Height:        Intake/Output Summary (Last 24 hours) at 05/15/2019 1323 Last data filed at 05/15/2019 1200 Gross per 24 hour  Intake 1416.77 ml  Output 1000 ml  Net 416.77 ml   Filed Weights   05/15/19 0026 05/15/19 0543  Weight: 61.2 kg 60.4 kg    Examination:  General exam: Appears calm and comfortable  Respiratory system: Clear to auscultation. Respiratory effort normal. Cardiovascular  system: S1 & S2 heard, RRR. No JVD, murmurs, rubs, gallops or clicks. No pedal edema. Gastrointestinal system: Abdomen is nondistended, soft and nontender. No organomegaly or masses felt. Normal bowel sounds heard. Central nervous system: Alert and oriented. No focal neurological deficits. Extremities: Symmetric 5 x 5 power. Skin: No rashes, lesions or ulcers Psychiatry: Judgement and insight appear normal. Mood & affect appropriate.    Data Reviewed: I have personally reviewed following labs and imaging studies  CBC: Recent Labs  Lab 05/14/19 1750 05/15/19 0531  WBC 6.6 6.0  NEUTROABS 4.4  --   HGB 11.9* 11.4*  HCT 36.6 33.5*  MCV 98.7 96.3  PLT 132* [id number]*   Basic Metabolic Panel: Recent Labs  Lab 05/14/19 1750 05/15/19 0531  NA 140 140  K 4.1 3.4*  CL 104 101  CO2 23 29  GLUCOSE 109* 147*  BUN 13 12  CREATININE 0.90 0.93  CALCIUM 9.2 9.4  MG  --  1.7  PHOS  --  3.7   GFR: Estimated Creatinine Clearance: 33.9 mL/min (by C-G formula based on SCr of 0.93 mg/dL). Liver Function Tests: Recent Labs  Lab 05/14/19 2133 05/15/19 0531  AST 22 16  ALT 16 15  ALKPHOS 30* 32*  BILITOT 0.5 0.5  PROT 5.8* 5.8*  ALBUMIN 3.4* 3.4*   No results for input(s): LIPASE, AMYLASE in the last 168 hours. No results for input(s): AMMONIA in the last 168 hours. Coagulation Profile: No results for input(s): INR, PROTIME in the last 168 hours. Cardiac Enzymes: No results for input(s): CKTOTAL, CKMB, CKMBINDEX, TROPONINI in the last 168 hours. BNP (last 3 results) No results for input(s): PROBNP in the last 8760 hours. HbA1C: No results for input(s): HGBA1C in the last 72 hours. CBG: Recent Labs  Lab 05/14/19 2220 05/15/19 0358 05/15/19 0545 05/15/19 1047  GLUCAP 158* 160* 138* 191*   Lipid Profile: No results for input(s): CHOL, HDL, LDLCALC, TRIG, CHOLHDL, LDLDIRECT in the last 72 hours. Thyroid Function Tests: Recent Labs    05/15/19 0531  TSH 2.519   Anemia Panel:  No results for input(s): VITAMINB12, FOLATE, FERRITIN, TIBC, IRON, RETICCTPCT in the last 72 hours. Sepsis Labs: No results for input(s): PROCALCITON, LATICACIDVEN in the last 168 hours.  Recent Results (from the past 240 hour(s))  SARS CORONAVIRUS 2 (TAT 6-24 HRS) Nasopharyngeal Nasopharyngeal Swab     Status: None   Collection Time: 05/14/19  6:52 PM   Specimen: Nasopharyngeal Swab  Result Value Ref Range Status   SARS Coronavirus 2 NEGATIVE NEGATIVE Final    Comment: (NOTE) SARS-CoV-2 target nucleic acids are NOT DETECTED. The SARS-CoV-2 RNA is generally detectable in upper and lower respiratory specimens during the acute phase of infection. Negative results do not preclude SARS-CoV-2 infection, do not rule out co-infections with other pathogens, and should not be used as the sole basis for  treatment or other patient management decisions. Negative results must be combined with clinical observations, patient history, and epidemiological information. The expected result is Negative. Fact Sheet for Patients: SugarRoll.be Fact Sheet for Healthcare Providers: https://www.woods-mathews.com/ This test is not yet approved or cleared by the [place] FDA and  has been authorized for detection and/or diagnosis of SARS-CoV-2 by FDA under an Emergency Use Authorization (EUA). This EUA will remain  in effect (meaning this test can be used) for the duration of the COVID-19 declaration under Section 56 4(b)(1) of the Act, 21 U.S.C. section 360bbb-3(b)(1), unless the authorization is terminated or revoked sooner. Performed at [facility], [place] [address], [place], [place] [address]       Radiology Studies: CT Head Wo Contrast  Result Date: 05/14/2019 CLINICAL DATA:  86 year old female with headache. EXAM: CT HEAD WITHOUT CONTRAST TECHNIQUE: Contiguous axial images were obtained from the base of the skull through the vertex without  intravenous contrast. COMPARISON:  Head CT dated 12/24/2009. FINDINGS: Brain: There is moderate age-related atrophy and chronic microvascular ischemic changes. There is no acute intracranial hemorrhage. No mass effect or midline shift. No extra-axial fluid collection. Vascular: No hyperdense vessel or unexpected calcification. Skull: Normal. Negative for fracture or focal lesion. Sinuses/Orbits: No acute finding. Other: None IMPRESSION: 1. No acute intracranial hemorrhage. 2. Age-related atrophy and chronic microvascular ischemic changes. Electronically Signed   By: Anner Crete M.D.   On: 05/14/2019 19:30   DG CHEST PORT 1 VIEW  Result Date: 05/14/2019 CLINICAL DATA:  Hypertension EXAM: PORTABLE CHEST 1 VIEW COMPARISON:  02/20/2015 FINDINGS: Heart and mediastinal contours are within normal limits. No focal opacities or effusions. No acute bony abnormality. IMPRESSION: No active disease. Electronically Signed   By: Rolm Baptise M.D.   On: 05/14/2019 22:53    Scheduled Meds: . enoxaparin (LOVENOX) injection  40 mg Subcutaneous Q24H  . flecainide  50 mg Oral BID  . insulin aspart  0-5 Units Subcutaneous QHS  . insulin aspart  0-9 Units Subcutaneous TID WC  . losartan  100 mg Oral Daily  . metoprolol succinate  50 mg Oral Daily  . pantoprazole  40 mg Oral Daily  . sodium chloride flush  3 mL Intravenous Q12H  . tamoxifen  20 mg Oral Daily   Continuous Infusions: . sodium chloride       LOS: 0 days   Time spent: 34 minutes   Darliss Cheney, MD [facility]  05/15/2019, 1:23 PM   To contact the attending provider between 7A-7P or the covering provider during after hours 7P-7A, please log into the web site [email or web address].

## 2019-05-15 NOTE — Evaluation (Signed)
Occupational Therapy Evaluation Patient Details Name: Tami Jacobson MRN: [id number] DOB: Mar 19, 1928 Today's Date: 05/15/2019    History of Present Illness Pt is a 86 yo female admitted with headache, blurry vision and high BP.  Pt with h/o breast CA, HTN.   Clinical Impression   Pt admitted with the above diagnosis and has the deficits listed below. Feel pt would benefit from continued OT to increase independence and confidence with balance during adls on her feet and decrease fall risk by addressing mild balance deficits during adls on her feet.  Pt lives alone. Pt's daughter is close by but has not seen pt since 2018. Pt has neighbors that will check on her daily but nobody that can actually stay with her.  Pt's BP during evaluation was 189/57 and pt reported dizziness and blurry vision.  Pt appears to be cognitively at baseline (slow to answer questions but accurate.). Pt drives, cooks, cleans and pays own bills.  Feel she is unsafe to go home alone with current dizziness.  Will continue to follow.     Follow Up Recommendations  Home health OT;Supervision/Assistance - 24 hour    Equipment Recommendations  3 in 1 bedside commode    Recommendations for Other Services       Precautions / Restrictions Precautions Precautions: Fall Restrictions Weight Bearing Restrictions: No      Mobility Bed Mobility Overal bed mobility: Modified Independent             General bed mobility comments: used bed rails  Transfers Overall transfer level: Needs assistance Equipment used: 1 person hand held assist Transfers: Sit to/from Stand;Stand Pivot Transfers Sit to Stand: Min guard Stand pivot transfers: Min guard       General transfer comment: no physical assist needed but pt did have 2 losses of balance in room and regained them Ily.    Balance Overall balance assessment: Needs assistance Sitting-balance support: Feet supported Sitting balance-Leahy Scale: Good     Standing  balance support: No upper extremity supported Standing balance-Leahy Scale: Fair Standing balance comment: Pt did not lose balance but cannot take many challenges.                           ADL either performed or assessed with clinical judgement   ADL Overall ADL's : Needs assistance/impaired Eating/Feeding: Independent;Sitting   Grooming: Wash/dry hands;Wash/dry face;Oral care;Supervision/safety;Standing   Upper Body Bathing: Set up;Sitting   Lower Body Bathing: Min guard;Sit to/from stand;Cueing for compensatory techniques   Upper Body Dressing : Set up;Sitting   Lower Body Dressing: Min guard;Sit to/from stand;Cueing for compensatory techniques   Toilet Transfer: Min guard;Ambulation;Comfort height toilet;Grab bars   Toileting- Clothing Manipulation and Hygiene: Supervision/safety;Sit to/from stand;Cueing for compensatory techniques   Tub/ Shower Transfer: Minimal assistance;Ambulation;Tub transfer   Functional mobility during ADLs: Minimal assistance General ADL Comments: Pt overall needs steadying assist for adls. Pt completes all adls in sitting but reports dizziness in standing.       Vision Baseline Vision/History: Wears glasses Wears Glasses: At all times Patient Visual Report: Blurring of vision Vision Assessment?: Yes Eye Alignment: Within Functional Limits Ocular Range of Motion: Within Functional Limits Alignment/Gaze Preference: Within Defined Limits Tracking/Visual Pursuits: Able to track stimulus in all quads without difficulty Saccades: Within functional limits Convergence: Within functional limits Visual Fields: No apparent deficits     Agricultural engineer Tested?: Yes   Praxis Praxis Praxis tested?: Within functional limits  Pertinent Vitals/Pain Pain Assessment: Faces Faces Pain Scale: Hurts a little bit Pain Location: head Pain Descriptors / Indicators: Aching Pain Intervention(s): Monitored during  session;Repositioned     Hand Dominance Right   Extremity/Trunk Assessment Upper Extremity Assessment Upper Extremity Assessment: Overall WFL for tasks assessed   Lower Extremity Assessment Lower Extremity Assessment: Defer to PT evaluation   Cervical / Trunk Assessment Cervical / Trunk Assessment: Normal   Communication Communication Communication: No difficulties   Cognition Arousal/Alertness: Awake/alert Behavior During Therapy: WFL for tasks assessed/performed Overall Cognitive Status: Within Functional Limits for tasks assessed                                 General Comments: pt did require a little bit of extra time to process.   General Comments  Pt overall does not need much physical assist during adls. Pt does complain of dizziness and blurry vision at times.  Pt's BP was 189/57.  Pt lives alone and has friends that can check on her but not stay with her.      Exercises     Shoulder Instructions      Home Living Family/patient expects to be discharged to:: Private residence Living Arrangements: Alone Available Help at Discharge: Friend(s);Available PRN/intermittently Type of Home: House Home Access: Level entry     Home Layout: One level     Bathroom Shower/Tub: Tub/shower unit;Curtain   Biochemist, clinical: Standard     Home Equipment: Cane - single point;Walker - 2 wheels   Additional Comments: does not use walker or cane but has them      Prior Functioning/Environment Level of Independence: Independent        Comments: drives, pays bills and cooks        OT Problem List: Decreased knowledge of use of DME or AE;Pain;Impaired balance (sitting and/or standing)      OT Treatment/Interventions: Self-care/ADL training;Therapeutic activities    OT Goals(Current goals can be found in the care plan section) Acute Rehab OT Goals Patient Stated Goal: to be steady on my feet. OT Goal Formulation: With patient Time For Goal Achievement:  05/29/19 Potential to Achieve Goals: Good ADL Goals Additional ADL Goal #1: Pt will walk to batrhoom and toilet with modified independence. Additional ADL Goal #2: Pt will gather all clothes and dress self Ily. Additional ADL Goal #3: Pt will do 3 home skills in room (make bed, fold laundry and clean bathroom sink) with modified independence.  OT Frequency: Min 2X/week   Barriers to D/C: Decreased caregiver support  Pt lives alone. Daughter close by but has not seen her since 2018.  Neighbors check on her daily.       Co-evaluation              AM-PAC OT "6 Clicks" Daily Activity     Outcome Measure Help from another person eating meals?: None Help from another person taking care of personal grooming?: None Help from another person toileting, which includes using toliet, bedpan, or urinal?: None Help from another person bathing (including washing, rinsing, drying)?: A Little Help from another person to put on and taking off regular upper body clothing?: None Help from another person to put on and taking off regular lower body clothing?: A Little 6 Click Score: 22   End of Session Nurse Communication: Mobility status  Activity Tolerance: Patient tolerated treatment well Patient left: in chair;with call bell/phone within reach  OT  Visit Diagnosis: Unsteadiness on feet (R26.81)                Time: [id number] OT Time Calculation (min): 36 min Charges:  OT General Charges $OT Visit: 1 Visit OT Evaluation $OT Eval Moderate Complexity: 1 Mod OT Treatments $Self Care/Home Management : 8-22 mins  Jinger Neighbors OTR/L  Glenford Peers 05/15/2019, 10:00 AM

## 2019-05-15 NOTE — Care Management Obs Status (Signed)
[facility] NOTIFICATION   Patient Details  Name: KAMIRRA JASKOWIAK MRN: [id number] Date of Birth: 1928/02/09   Medicare Observation Status Notification Given:  Yes    Geralynn Ochs, LCSW 05/15/2019, 1:31 PM

## 2019-05-16 LAB — BASIC METABOLIC PANEL
Anion gap: 10 (ref 5–15)
BUN: 11 mg/dL (ref 8–23)
CO2: 27 mmol/L (ref 22–32)
Calcium: 9.5 mg/dL (ref 8.9–10.3)
Chloride: 104 mmol/L (ref 98–111)
Creatinine, Ser: 0.75 mg/dL (ref 0.44–1.00)
GFR calc Af Amer: >60 mL/min (ref >60)
GFR calc non Af Amer: >60 mL/min (ref >60)
Glucose, Bld: 139 mg/dL — ABNORMAL HIGH (ref 70–99)
Potassium: 3.8 mmol/L (ref 3.5–5.1)
Sodium: 141 mmol/L (ref 135–145)

## 2019-05-16 LAB — GLUCOSE, CAPILLARY
Glucose-Capillary: 140 mg/dL — ABNORMAL HIGH (ref 70–99)
Glucose-Capillary: 196 mg/dL — ABNORMAL HIGH (ref 70–99)

## 2019-05-16 LAB — CBC
HCT: 33.2 % — ABNORMAL LOW (ref 36.0–46.0)
Hemoglobin: 11.2 g/dL — ABNORMAL LOW (ref 12.0–15.0)
MCH: 32.5 pg (ref 26.0–34.0)
MCHC: 33.7 g/dL (ref 30.0–36.0)
MCV: 96.2 fL (ref 80.0–100.0)
Platelets: 123 10*3/uL — ABNORMAL LOW (ref 150–400)
RBC: 3.45 MIL/uL — ABNORMAL LOW (ref 3.87–5.11)
RDW: 13 % (ref 11.5–15.5)
WBC: 5.2 10*3/uL (ref 4.0–10.5)
nRBC: 0 % (ref 0.0–0.2)

## 2019-05-16 MED ORDER — METOPROLOL SUCCINATE ER 50 MG PO TB24: 50.0000 mg | ORAL_TABLET | Freq: Every day | ORAL | 0 refills | Status: DC

## 2019-05-16 MED ORDER — LOSARTAN POTASSIUM 100 MG PO TABS: 100.0000 mg | ORAL_TABLET | Freq: Every day | ORAL | 0 refills | Status: DC

## 2019-05-16 MED FILL — LOSARTAN POTASSIUM 100 MG T: 100 | 30 days supply | Qty: 30 | Fill #0

## 2019-05-16 MED FILL — METOPROLOL SUCCINATE ER 50: 50 | 30 days supply | Qty: 30 | Fill #0

## 2019-05-16 NOTE — Progress Notes (Signed)
Physical Therapy Treatment Patient Details Name: Tami Jacobson MRN: [id number] DOB: 21-Nov-1927 Today's Date: 05/16/2019    History of Present Illness Pt is a 86 yo female admitted with headache, blurry vision and high BP.  Pt with h/o breast CA, HTN.    PT Comments    Pt was seen for mobility on walker and went through her entire routine with stairs and hallway gait with one incidence of LOB with minor help to assist her.  Pt was given a belt for home, instructed on how to use with her helpers and asked her to avoid walking alone until her PT with home therapy gives her permission.  Follow up as her stay at hosp permits, transfer care to [facility].  Follow Up Recommendations  Home health PT;Supervision for mobility/OOB     Equipment Recommendations  Rolling walker with 5" wheels    Recommendations for Other Services       Precautions / Restrictions Precautions Precautions: Fall Precaution Comments: monitor BP Restrictions Weight Bearing Restrictions: No    Mobility  Bed Mobility Overal bed mobility: Modified Independent             General bed mobility comments: able to transition supine>EOB from flat bed with no use of rails  Transfers Overall transfer level: Modified independent Equipment used: Rolling walker (2 wheeled) Transfers: Sit to/from Stand Sit to Stand: Supervision         General transfer comment: supervision for safety for sit>stand with no AD  Ambulation/Gait Ambulation/Gait assistance: Min guard Gait Distance (Feet): 300 Feet(150 x 2) Assistive device: Rolling walker (2 wheeled);1 person hand held assist Gait Pattern/deviations: Step-through pattern;Decreased stride length;Narrow base of support Gait velocity: reduced Gait velocity interpretation: <1.31 ft/sec, indicative of household ambulator General Gait Details: pt was given her walker for home which was adjusted for height and was inside frame for all gait, cued for transfers and  approaching a chair   Stairs Stairs: Yes Stairs assistance: Min guard Stair Management: One rail Right;Alternating pattern;Step to pattern;Forwards Number of Stairs: 4 General stair comments: pt completed stairs with alternate pattern up and step to coming down   Wheelchair Mobility    Modified Rankin (Stroke Patients Only)       Balance Overall balance assessment: Needs assistance Sitting-balance support: Feet supported Sitting balance-Leahy Scale: Good Sitting balance - Comments: close supervision when reaching to feet to don socks from EOB   Standing balance support: Bilateral upper extremity supported;During functional activity Standing balance-Leahy Scale: Fair Standing balance comment: min guard for safety dynamically but able to gather items on floor with no LOB                            Cognition Arousal/Alertness: Awake/alert Behavior During Therapy: WFL for tasks assessed/performed Overall Cognitive Status: Within Functional Limits for tasks assessed                                 General Comments: cognition appears baseline      Exercises      General Comments General comments (skin integrity, edema, etc.): BP was 152/41      Pertinent Vitals/Pain Pain Assessment: Faces Faces Pain Scale: Hurts a little bit Pain Location: R hand where IV was Pain Descriptors / Indicators: Sore Pain Intervention(s): (None needed)    Home Living  Prior Function            PT Goals (current goals can now be found in the care plan section) Acute Rehab PT Goals Patient Stated Goal: get home and feel stronger Progress towards PT goals: Progressing toward goals    Frequency    Min 3X/week      PT Plan Current plan remains appropriate    Co-evaluation              AM-PAC PT "6 Clicks" Mobility   Outcome Measure  Help needed turning from your back to your side while in a flat bed without using  bedrails?: None Help needed moving from lying on your back to sitting on the side of a flat bed without using bedrails?: None Help needed moving to and from a bed to a chair (including a wheelchair)?: A Little Help needed standing up from a chair using your arms (e.g., wheelchair or bedside chair)?: A Little Help needed to walk in hospital room?: A Little Help needed climbing 3-5 steps with a railing? : A Little 6 Click Score: 20    End of Session Equipment Utilized During Treatment: Gait belt Activity Tolerance: Patient tolerated treatment well;Treatment limited secondary to medical complications (Comment) Patient left: in chair;with call bell/phone within reach;with chair alarm set Nurse Communication: Mobility status PT Visit Diagnosis: Unsteadiness on feet (R26.81);Ataxic gait (R26.0)     Time: [id number] PT Time Calculation (min) (ACUTE ONLY): 33 min  Charges:  $Gait Training: 8-22 mins $Therapeutic Activity: 8-22 mins                  Ramond Dial 05/16/2019, 12:03 PM  Mee Hives, PT MS Acute Rehab Dept. Number: [place]

## 2019-05-16 NOTE — Progress Notes (Signed)
Occupational Therapy Treatment Patient Details Name: Tami Jacobson MRN: [id number] DOB: 02/13/1928 Today's Date: 05/16/2019    History of present illness Pt is a 86 yo female admitted with headache, blurry vision and high BP.  Pt with h/o breast CA, HTN.   OT comments  Pt making good progress towards OT goals this session. Session focus on functional mobility with no AD, standing grooming tasks at sink and IADL tasks such as gathering items and cleaning sink. Overall, pt required min guard- supervision for functional mobility from EOB>sink with no AD. Pt completed standing ADLs as indicated below with mostly supervision needing intermittent min cues- min guard for safety. Pt demo IADL tasks such as gathering items from floor to demo household chores and standing to clean sink with no UE support and no LOB during tasks. Feel pt likely nearing baseline level of function. Pt agreeable to [place] but may decline DME listed below as pt feels she may not need 3n1. Pt reports "working on getting a RW." Will continue to follow acutely per POC.    Follow Up Recommendations  Home health OT;Supervision/Assistance - 24 hour    Equipment Recommendations  3 in 1 bedside commode    Recommendations for Other Services      Precautions / Restrictions Precautions Precautions: Fall Precaution Comments: monitor BP Restrictions Weight Bearing Restrictions: No       Mobility Bed Mobility Overal bed mobility: Independent             General bed mobility comments: able to transition supine>EOB from flat bed with no use of rails  Transfers Overall transfer level: Needs assistance Equipment used: None Transfers: Sit to/from Stand Sit to Stand: Supervision         General transfer comment: supervision for safety for sit>stand with no AD    Balance Overall balance assessment: Needs assistance Sitting-balance support: Feet supported;No upper extremity supported Sitting balance-Leahy Scale:  Fair Sitting balance - Comments: close supervision when reaching to feet to don socks from EOB   Standing balance support: No upper extremity supported;During functional activity Standing balance-Leahy Scale: Fair Standing balance comment: min guard for safety dynamically but able to gather items on floor with no LOB                           ADL either performed or assessed with clinical judgement   ADL Overall ADL's : Needs assistance/impaired     Grooming: Wash/dry face;Oral care;Wash/dry hands;Standing;Supervision/safety   Upper Body Bathing: Supervision/ safety;Standing       Upper Body Dressing : Set up;Sitting   Lower Body Dressing: Min guard;Sit to/from stand;Cueing for compensatory techniques Lower Body Dressing Details (indicate cue type and reason): cues for compensatory strategy to don socks from EOB Toilet Transfer: Supervision/safety;Ambulation Toilet Transfer Details (indicate cue type and reason): supervision for safety with no AD         Functional mobility during ADLs: Supervision/safety General ADL Comments: session focus on standing grooming tasks at sink, UB bathing, LB dressing IADL tasks such as gather items and cleaning sink. overall, pt supervision for mobility with no AD, intermittent cues for safety     Vision Baseline Vision/History: Wears glasses Wears Glasses: At all times     Perception     Praxis      Cognition Arousal/Alertness: Awake/alert Behavior During Therapy: [facility] for tasks assessed/performed Overall Cognitive Status: Within Functional Limits for tasks assessed  General Comments: cognition appears baseline        Exercises     Shoulder Instructions       General Comments BP 152/41 HR 67 sitting EOB at start of session; pt reports no dizzines during session    Pertinent Vitals/ Pain       Pain Assessment: No/denies pain  Home Living                                           Prior Functioning/Environment              Frequency  Min 2X/week        Progress Toward Goals  OT Goals(current goals can now be found in the care plan section)  Progress towards OT goals: Progressing toward goals  Acute Rehab OT Goals Patient Stated Goal: to be able to get home safely OT Goal Formulation: With patient Time For Goal Achievement: 05/29/19 Potential to Achieve Goals: Good  Plan Discharge plan remains appropriate    Co-evaluation                 AM-PAC OT "6 Clicks" Daily Activity     Outcome Measure   Help from another person eating meals?: None Help from another person taking care of personal grooming?: None Help from another person toileting, which includes using toliet, bedpan, or urinal?: None Help from another person bathing (including washing, rinsing, drying)?: A Little Help from another person to put on and taking off regular upper body clothing?: None Help from another person to put on and taking off regular lower body clothing?: A Little 6 Click Score: 22    End of Session    OT Visit Diagnosis: Unsteadiness on feet (R26.81)   Activity Tolerance Patient tolerated treatment well   Patient Left in bed;with call bell/phone within reach;Other (comment)(sitting EOB)   Nurse Communication          [id number] OT Time Calculation (min): 32 min  Charges: OT General Charges $OT Visit: 1 Visit OT Treatments $Self Care/Home Management : 23-37 mins  Lanier Clam., COTA/L Acute Rehabilitation Services [phone]    Tami Jacobson 05/16/2019, 9:04 AM

## 2019-05-16 NOTE — TOC Progression Note (Signed)
Transition of Care [facility]) - Progression Note    Patient Details  Name: Tami Jacobson MRN: [id number] Date of Birth: 09/14/1927  Transition of Care [facility] CM/SW [place], [place] Phone Number: 05/16/2019, 11:50 AM  Clinical Narrative:   CSW met with patient to discuss discharge home. Patient did find a friend to stay with her for a few days, and is agreeable to equipment. CSW sent referral to [facility] for walker and 3N1. Patient will have a ride home. CSW to set up home health and then alert RN when patient is ready for DC.     Expected Discharge Plan: [place] Barriers to Discharge: Continued Medical Work up, Ship broker  Expected Discharge Plan and Services Expected Discharge Plan: [place] Choice: Durable Medical Equipment, Home Health Living arrangements for the past 2 months: Single Family Home Expected Discharge Date: 05/16/19               DME Arranged: Gilford Rile rolling DME Agency: [facility]       HH Arranged: PT, OT HH Agency: [facility] ([facility])         Social Determinants of Health (SDOH) Interventions    Readmission Risk Interventions No flowsheet data found.

## 2019-05-16 NOTE — Care Management (Signed)
[facility] unable to take referral. Cassie with [facility] accepted referral for HHPT and OT.  Magdalen Spatz RN

## 2019-05-16 NOTE — Discharge Summary (Signed)
Physician Discharge Summary  Tami Jacobson YNW:[id number] DOB: [birth year] DOA: 05/14/2019  PCP: Binnie Rail, MD  Admit date: 05/14/2019 Discharge date: 05/16/2019  Admitted From: Home Disposition: Home  Recommendations for Outpatient Follow-up:  1. Follow up with PCP in 1-2 weeks 2. Please obtain BMP/CBC in one week your next doctors visit.  3. Losartan increased.  Advised to continue metoprolol.   Discharge Condition: Stable CODE STATUS: Full code Diet recommendation: 2 g salt  Brief/Interim Summary: 86 year old female with history of hypertension and breast cancer and guarding type 2 diabetes mellitus presented with 1 week history of elevated blood pressure, headaches and blurred vision.  She saw her PCP last week due to blurred vision and elevated blood pressure and was added losartan to her previous regimen of metoprolol 50 mg a day.  She has been compliant with the medications.  She presented with hypertensive urgency in the emergency department.  CT head was negative.  Losartan was increased 100 mg daily, Toprol was continued.  Over the course of 24 hours headache and dizziness improved.  CT of the head was negative.  Stable for discharge today.  Hypertensive urgency, resolved Essential hypertension, better control -Losartan increased to 100 mg daily.  Continue Toprol-XL. -CT of the head negative. -Advised to follow 2 g salt diet  Hyperlipidemia-not on any home medications  GERD-continue PPI  Chronic diastolic congestive heart failure-appears to be euvolemic.  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    Discharge Diagnoses:  Active Problems:   Diabetes mellitus type 2, controlled ([place])   HYPERLIPIDEMIA   Essential hypertension   GERD (gastroesophageal reflux disease)   History of breast cancer, Right, T1c, N0, lumpectomy 06/26/2008.   Left, T1c, N0, lumpectomy 06/24/2006.    Chronic diastolic CHF (congestive heart failure) ([place])   Hypertensive urgency    Consultations:  None  Subjective: Feels great, no complaints.  States she is able to make arrangements for some help at home.  Does not want to go to skilled nursing facility.  Wants to go home today.  Discharge Exam: Vitals:   05/16/19 0527 05/16/19 0857  BP: (!) 158/56 (!) 152/41  Pulse: 63 67  Resp: 15   Temp: 98.3 F (36.8 C)   SpO2: 94%    Vitals:   05/15/19 1809 05/15/19 2345 05/16/19 0527 05/16/19 0857  BP: (!) 153/54 (!) 141/52 (!) 158/56 (!) 152/41  Pulse: 66 (!) 59 63 67  Resp: '18 15 15   ' Temp: 98.4 F (36.9 C) 98.3 F (36.8 C) 98.3 F (36.8 C)   TempSrc: Oral Oral Oral   SpO2: 99% 92% 94%   Weight:   60.1 kg   Height:        General: Pt is alert, awake, not in acute distress Cardiovascular: RRR, S1/S2 +, no rubs, no gallops Respiratory: CTA bilaterally, no wheezing, no rhonchi Abdominal: Soft, NT, ND, bowel sounds + Extremities: no edema, no cyanosis  Discharge Instructions  Discharge Instructions    Diet - low sodium heart healthy   Complete by: As directed    Discharge instructions   Complete by: As directed    You were cared for by a hospitalist during your hospital stay. If you have any questions about your discharge medications or the care you received while you were in the hospital after you are discharged, you can call the unit and asked to speak with the hospitalist on call if the hospitalist  that took care of you is not available. Once you are discharged, your primary care physician will handle any further medical issues. Please note that NO REFILLS for any discharge medications will be authorized once you are discharged, as it is imperative that you return to your primary care physician (or establish a relationship with a primary care physician if you do not have one) for your aftercare needs so that they can reassess your need for medications and monitor your lab  values.  Please request your Prim.MD to go over all Hospital Tests and Procedure/Radiological results at the follow up, please get all Hospital records sent to your Prim MD by signing hospital release before you go home.  Get CBC, CMP, 2 view Chest X ray checked  by Primary MD during your next visit or SNF MD in 5-7 days ( we routinely change or add medications that can affect your baseline labs and fluid status, therefore we recommend that you get the mentioned basic workup next visit with your PCP, your PCP may decide not to get them or add new tests based on their clinical decision)  On your next visit with your primary care physician please Get Medicines reviewed and adjusted.  If you experience worsening of your admission symptoms, develop shortness of breath, life threatening emergency, suicidal or homicidal thoughts you must seek medical attention immediately by calling 911 or calling your MD immediately  if symptoms less severe.  You Must read complete instructions/literature along with all the possible adverse reactions/side effects for all the Medicines you take and that have been prescribed to you. Take any new Medicines after you have completely understood and accpet all the possible adverse reactions/side effects.   Do not drive, operate heavy machinery, perform activities at heights, swimming or participation in water activities or provide baby sitting services if your were admitted for syncope or siezures until you have seen by Primary MD or a Neurologist and advised to do so again.  Do not drive when taking Pain medications.   Face-to-face encounter (required for Medicare/Medicaid patients)   Complete by: As directed    I Royce Stegman Chirag Alverto Shedd certify that this patient is under my care and that I, or a nurse practitioner or physician's assistant working with me, had a face-to-face encounter that meets the physician face-to-face encounter requirements with this patient on 05/16/2019. The  encounter with the patient was in whole, or in part for the following medical condition(s) which is the primary reason for home health care generalized waekness   The encounter with the patient was in whole, or in part, for the following medical condition, which is the primary reason for home health care: generalized weakness   I certify that, based on my findings, the following services are medically necessary home health services: Physical therapy   Reason for Medically Necessary Home Health Services: Therapy- Therapeutic Exercises to Increase Strength and Endurance   My clinical findings support the need for the above services: Unsafe ambulation due to balance issues   Further, I certify that my clinical findings support that this patient is homebound due to: Ambulates short distances less than 300 feet   Home Health   Complete by: As directed    To provide the following care/treatments:  PT OT     Increase activity slowly   Complete by: As directed      Allergies as of 05/16/2019      Reactions   Statins Other (See Comments)   GI UPSET  Effexor [venlafaxine]    Constipation, shaking, dizzy   Cephalexin Other (See Comments)   Reaction not recalled by the patient   Codeine Other (See Comments)   Reaction not recalled by the patient   Lansoprazole Other (See Comments)   Reaction not recalled by the patient   Latex Rash      Levofloxacin Other (See Comments)   Reaction not recalled by the patient   Nitrofurantoin Other (See Comments)   Reaction not recalled by the patient   Rofecoxib Other (See Comments)   Reaction not recalled by the patient      Medication List    TAKE these medications   Accu-Chek Guide test strip Generic drug: glucose blood USE AS DIRECTED   aspirin EC 81 MG tablet Take 81 mg by mouth as needed (for headaches).   blood glucose meter kit and supplies Kit Dispense based on patient and insurance preference. Check sugars once daily and as needed as  directed.   clobetasol cream 0.05 % Commonly known as: TEMOVATE Apply 1 application topically as needed (for skin irritation- to affected areas).   cyanocobalamin 1000 MCG/ML injection Commonly known as: (VITAMIN B-12) Inject 1,000 mcg into the muscle every 30 (thirty) days.   diphenhydramine-acetaminophen 25-500 MG Tabs tablet Commonly known as: TYLENOL PM Take 1 tablet by mouth at bedtime as needed (for sleep).   flecainide 50 MG tablet Commonly known as: TAMBOCOR TAKE 1 TABLET BY MOUTH TWO  TIMES DAILY   losartan 100 MG tablet Commonly known as: COZAAR Take 1 tablet (100 mg total) by mouth daily. What changed:   medication strength  how much to take   metFORMIN 500 MG 24 hr tablet Commonly known as: GLUCOPHAGE-XR TAKE 1 TABLET BY MOUTH ONCE DAILY WITH BREAKFAST What changed: See the new instructions.   metoprolol succinate 50 MG 24 hr tablet Commonly known as: TOPROL-XL Take 1 tablet (50 mg total) by mouth daily. Take with or immediately following a meal. What changed: additional instructions   naproxen sodium 220 MG tablet Commonly known as: ALEVE Take 220 mg by mouth daily as needed (for pain or headaches).   omeprazole 20 MG capsule Commonly known as: PRILOSEC Take 1 capsule (20 mg total) by mouth daily. Please schedule an Office Visit for any further refills.  Thank you What changed:   when to take this  additional instructions   tamoxifen 20 MG tablet Commonly known as: NOLVADEX TAKE 1 TABLET BY MOUTH  DAILY            Durable Medical Equipment  (From admission, onward)         Start     Ordered   05/16/19 1025  For home use only DME Walker rolling  Once    Question Answer Comment  Walker: With [place] Wheels   Patient needs a walker to treat with the following condition Ambulatory dysfunction      05/16/19 1024   05/16/19 0911  DME 3-in-1  Once     05/16/19 0912         Follow-up Information    Binnie Rail, MD. Schedule an  appointment as soon as possible for a visit in 1 week(s).   Specialty: Internal Medicine Contact information: [place] [id number] [phone]          Allergies  Allergen Reactions  . Statins Other (See Comments)    GI UPSET  . Effexor [Venlafaxine]     Constipation, shaking, dizzy  .  Cephalexin Other (See Comments)    Reaction not recalled by the patient  . Codeine Other (See Comments)    Reaction not recalled by the patient  . Lansoprazole Other (See Comments)    Reaction not recalled by the patient  . Latex Rash       . Levofloxacin Other (See Comments)    Reaction not recalled by the patient  . Nitrofurantoin Other (See Comments)    Reaction not recalled by the patient  . Rofecoxib Other (See Comments)    Reaction not recalled by the patient     You were cared for by a hospitalist during your hospital stay. If you have any questions about your discharge medications or the care you received while you were in the hospital after you are discharged, you can call the unit and asked to speak with the hospitalist on call if the hospitalist that took care of you is not available. Once you are discharged, your primary care physician will handle any further medical issues. Please note that no refills for any discharge medications will be authorized once you are discharged, as it is imperative that you return to your primary care physician (or establish a relationship with a primary care physician if you do not have one) for your aftercare needs so that they can reassess your need for medications and monitor your lab values.   Procedures/Studies: CT Head Wo Contrast  Result Date: 05/14/2019 CLINICAL DATA:  87 year old female with headache. EXAM: CT HEAD WITHOUT CONTRAST TECHNIQUE: Contiguous axial images were obtained from the base of the skull through the vertex without intravenous contrast. COMPARISON:  Head CT dated 12/24/2009. FINDINGS: Brain: There is  moderate age-related atrophy and chronic microvascular ischemic changes. There is no acute intracranial hemorrhage. No mass effect or midline shift. No extra-axial fluid collection. Vascular: No hyperdense vessel or unexpected calcification. Skull: Normal. Negative for fracture or focal lesion. Sinuses/Orbits: No acute finding. Other: None IMPRESSION: 1. No acute intracranial hemorrhage. 2. Age-related atrophy and chronic microvascular ischemic changes. Electronically Signed   By: Anner Crete M.D.   On: 05/14/2019 19:30   DG CHEST PORT 1 VIEW  Result Date: 05/14/2019 CLINICAL DATA:  Hypertension EXAM: PORTABLE CHEST 1 VIEW COMPARISON:  02/20/2015 FINDINGS: Heart and mediastinal contours are within normal limits. No focal opacities or effusions. No acute bony abnormality. IMPRESSION: No active disease. Electronically Signed   By: Rolm Baptise M.D.   On: 05/14/2019 22:53   ECHOCARDIOGRAM COMPLETE  Result Date: 05/11/2019   ECHOCARDIOGRAM REPORT   Patient Name:   Shawnie Dapper Date of Exam: 05/11/2019 Medical Rec #:  [id number]        Height:       63.0 in Accession #:    [id number]       Weight:       135.0 lb Date of Birth:  Jun 18, 1927        BSA:          1.64 m Patient Age:    79 years         BP:           196/74 mmHg Patient Gender: F                HR:           66 bpm. Exam Location:  [facility] Procedure: 2D Echo, 3D Echo, Cardiac Doppler, Color Doppler and Strain Analysis Indications:    R01.1 Murmur  History:  Patient has prior history of Echocardiogram examinations, most                 recent 04/25/2014. Carotid Disease, Arrythmias:PVC; Risk                 Factors:Hypertension, Diabetes and Dyslipidemia. Breast cancer.  Sonographer:    Jessee Avers, RDCS Referring Phys: [id number] [place]  1. Left ventricular ejection fraction, by visual estimation, is 55 to 60%. The left ventricle has normal function. Left ventricular septal wall thickness was mildly increased. Mildly  increased left ventricular posterior wall thickness. There is mildly increased left ventricular hypertrophy.  2. Elevated left ventricular end-diastolic pressure.  3. Left ventricular diastolic parameters are consistent with Grade I diastolic dysfunction (impaired relaxation).  4. The left ventricle has no regional wall motion abnormalities.  5. Global right ventricle has normal systolic function.The right ventricular size is normal. No increase in right ventricular wall thickness.  6. Left atrial size was mildly dilated.  7. Right atrial size was normal.  8. The mitral valve is normal in structure. No evidence of mitral valve regurgitation. No evidence of mitral stenosis.  9. The tricuspid valve is normal in structure. 10. The tricuspid valve is normal in structure. Tricuspid valve regurgitation is mild. 11. The aortic valve is normal in structure. Aortic valve regurgitation is not visualized. No evidence of aortic valve sclerosis or stenosis. 12. The pulmonic valve was normal in structure. Pulmonic valve regurgitation is not visualized. 13. Normal pulmonary artery systolic pressure. 14. The inferior vena cava is normal in size with greater than 50% respiratory variability, suggesting right atrial pressure of 3 mmHg. In comparison to the previous echocardiogram(s): 04/25/14 EF 55-60%. FINDINGS  Left Ventricle: Left ventricular ejection fraction, by visual estimation, is 55 to 60%. The left ventricle has normal function. The left ventricle has no regional wall motion abnormalities. Mildly increased left ventricular posterior wall thickness. There is mildly increased left ventricular hypertrophy. Left ventricular diastolic parameters are consistent with Grade I diastolic dysfunction (impaired relaxation). Elevated left ventricular end-diastolic pressure. Right Ventricle: The right ventricular size is normal. No increase in right ventricular wall thickness. Global RV systolic function is has normal systolic function.  The tricuspid regurgitant velocity is 2.55 m/s, and with an assumed right atrial pressure  of 3 mmHg, the estimated right ventricular systolic pressure is normal at 29.0 mmHg. Left Atrium: Left atrial size was mildly dilated. Right Atrium: Right atrial size was normal in size Pericardium: There is no evidence of pericardial effusion. Mitral Valve: The mitral valve is normal in structure. No evidence of mitral valve regurgitation. No evidence of mitral valve stenosis by observation. Tricuspid Valve: The tricuspid valve is normal in structure. Tricuspid valve regurgitation is mild. Aortic Valve: The aortic valve is normal in structure. Aortic valve regurgitation is not visualized. The aortic valve is structurally normal, with no evidence of sclerosis or stenosis. Pulmonic Valve: The pulmonic valve was normal in structure. Pulmonic valve regurgitation is not visualized. Pulmonic regurgitation is not visualized. Aorta: The aortic root, ascending aorta and aortic arch are all structurally normal, with no evidence of dilitation or obstruction. Venous: The inferior vena cava is normal in size with greater than 50% respiratory variability, suggesting right atrial pressure of 3 mmHg. IAS/Shunts: No atrial level shunt detected by color flow Doppler. There is no evidence of a patent foramen ovale. No ventricular septal defect is seen or detected. There is no evidence of an atrial septal defect.  LEFT  VENTRICLE PLAX 2D LVIDd:         4.00 cm  Diastology LVIDs:         2.80 cm  LV e' lateral:   7.58 cm/s LV PW:         1.30 cm  LV E/e' lateral: 9.2 LV IVS:        1.30 cm  LV e' medial:    4.58 cm/s LVOT diam:     2.00 cm  LV E/e' medial:  15.3 LV SV:         40 ml LV SV Index:   24.39    2D Longitudinal Strain LVOT Area:     3.14 cm 2D Strain GLS (A2C):   -21.1 %                         2D Strain GLS (A3C):   -20.5 %                         2D Strain GLS (A4C):   -28.1 %                         2D Strain GLS Avg:     -23.2 %                           3D Volume EF:                         3D EF:        67 %                         LV EDV:       102 ml                         LV ESV:       33 ml                         LV SV:        68 ml RIGHT VENTRICLE RV Basal diam:  2.90 cm RV S prime:     7.65 cm/s TAPSE (M-mode): 1.7 cm RVSP:           29.0 mmHg LEFT ATRIUM             Index       RIGHT ATRIUM           Index LA diam:        3.80 cm 2.32 cm/m  RA Pressure: 3.00 mmHg LA Vol (A2C):   54.1 ml 33.06 ml/m RA Area:     10.00 cm LA Vol (A4C):   28.8 ml 17.60 ml/m RA Volume:   18.60 ml  11.37 ml/m LA Biplane Vol: 40.3 ml 24.63 ml/m  AORTIC VALVE LVOT Vmax:   106.00 cm/s LVOT Vmean:  66.500 cm/s LVOT VTI:    0.259 m  AORTA Ao Root diam: 2.90 cm Ao Asc diam:  3.20 cm MITRAL VALVE                         TRICUSPID VALVE  TR Peak grad:   26.0 mmHg                                      TR Vmax:        255.00 cm/s MV Decel Time: 451 msec              Estimated RAP:  3.00 mmHg MV E velocity: 70.00 cm/s  103 cm/s  RVSP:           29.0 mmHg MV A velocity: 100.00 cm/s 70.3 cm/s MV E/A ratio:  0.70        1.5       SHUNTS                                      Systemic VTI:  0.26 m                                      Systemic Diam: 2.00 cm  Skeet Latch MD Electronically signed by Skeet Latch MD Signature Date/Time: 05/11/2019/6:19:34 PM    Final       The results of significant diagnostics from this hospitalization (including imaging, microbiology, ancillary and laboratory) are listed below for reference.     Microbiology: Recent Results (from the past 240 hour(s))  SARS CORONAVIRUS 2 (TAT 6-24 HRS) Nasopharyngeal Nasopharyngeal Swab     Status: None   Collection Time: 05/14/19  6:52 PM   Specimen: Nasopharyngeal Swab  Result Value Ref Range Status   SARS Coronavirus 2 NEGATIVE NEGATIVE Final    Comment: (NOTE) SARS-CoV-2 target nucleic acids are NOT DETECTED. The SARS-CoV-2 RNA is generally  detectable in upper and lower respiratory specimens during the acute phase of infection. Negative results do not preclude SARS-CoV-2 infection, do not rule out co-infections with other pathogens, and should not be used as the sole basis for treatment or other patient management decisions. Negative results must be combined with clinical observations, patient history, and epidemiological information. The expected result is Negative. Fact Sheet for Patients: SugarRoll.be Fact Sheet for Healthcare Providers: https://www.woods-mathews.com/ This test is not yet approved or cleared by the [place] FDA and  has been authorized for detection and/or diagnosis of SARS-CoV-2 by FDA under an Emergency Use Authorization (EUA). This EUA will remain  in effect (meaning this test can be used) for the duration of the COVID-19 declaration under Section 56 4(b)(1) of the Act, 21 U.S.C. section 360bbb-3(b)(1), unless the authorization is terminated or revoked sooner. Performed at [facility], [place] [address], [place], [place] [address]      Labs: BNP (last 3 results) Recent Labs    05/14/19 1740  BNP 992.4*   Basic Metabolic Panel: Recent Labs  Lab 05/14/19 1750 05/15/19 0531 05/16/19 0510  NA 140 140 141  K 4.1 3.4* 3.8  CL 104 101 104  CO2 '23 29 27  ' GLUCOSE 109* 147* 139*  BUN '13 12 11  ' CREATININE 0.90 0.93 0.75  CALCIUM 9.2 9.4 9.5  MG  --  1.7  --   PHOS  --  3.7  --    Liver Function Tests: Recent Labs  Lab 05/14/19 2133 05/15/19 0531  AST 22 16  ALT 16 15  ALKPHOS 30*  32*  BILITOT 0.5 0.5  PROT 5.8* 5.8*  ALBUMIN 3.4* 3.4*   No results for input(s): LIPASE, AMYLASE in the last 168 hours. No results for input(s): AMMONIA in the last 168 hours. CBC: Recent Labs  Lab 05/14/19 1750 05/15/19 0531 05/16/19 0510  WBC 6.6 6.0 5.2  NEUTROABS 4.4  --   --   HGB 11.9* 11.4* 11.2*  HCT 36.6 33.5* 33.2*  MCV 98.7 96.3 96.2   PLT 132* 129* 123*   Cardiac Enzymes: No results for input(s): CKTOTAL, CKMB, CKMBINDEX, TROPONINI in the last 168 hours. BNP: Invalid input(s): POCBNP CBG: Recent Labs  Lab 05/15/19 1047 05/15/19 1646 05/15/19 2148 05/16/19 0529 05/16/19 1109  GLUCAP 191* 146* 165* 140* 196*   D-Dimer No results for input(s): DDIMER in the last 72 hours. Hgb A1c Recent Labs    05/15/19 0531  HGBA1C 6.7*   Lipid Profile No results for input(s): CHOL, HDL, LDLCALC, TRIG, CHOLHDL, LDLDIRECT in the last 72 hours. Thyroid function studies Recent Labs    05/15/19 0531  TSH 2.519   Anemia work up No results for input(s): VITAMINB12, FOLATE, FERRITIN, TIBC, IRON, RETICCTPCT in the last 72 hours. Urinalysis    Component Value Date/Time   COLORURINE YELLOW 05/14/2019 1852   APPEARANCEUR CLEAR 05/14/2019 1852   LABSPEC 1.006 05/14/2019 1852   PHURINE 6.0 05/14/2019 1852   GLUCOSEU NEGATIVE 05/14/2019 1852   HGBUR NEGATIVE 05/14/2019 1852   BILIRUBINUR NEGATIVE 05/14/2019 1852   KETONESUR NEGATIVE 05/14/2019 1852   PROTEINUR NEGATIVE 05/14/2019 1852   NITRITE NEGATIVE 05/14/2019 1852   LEUKOCYTESUR TRACE (A) 05/14/2019 1852   Sepsis Labs Invalid input(s): PROCALCITONIN,  WBC,  LACTICIDVEN Microbiology Recent Results (from the past 240 hour(s))  SARS CORONAVIRUS 2 (TAT 6-24 HRS) Nasopharyngeal Nasopharyngeal Swab     Status: None   Collection Time: 05/14/19  6:52 PM   Specimen: Nasopharyngeal Swab  Result Value Ref Range Status   SARS Coronavirus 2 NEGATIVE NEGATIVE Final    Comment: (NOTE) SARS-CoV-2 target nucleic acids are NOT DETECTED. The SARS-CoV-2 RNA is generally detectable in upper and lower respiratory specimens during the acute phase of infection. Negative results do not preclude SARS-CoV-2 infection, do not rule out co-infections with other pathogens, and should not be used as the sole basis for treatment or other patient management decisions. Negative results must  be combined with clinical observations, patient history, and epidemiological information. The expected result is Negative. Fact Sheet for Patients: SugarRoll.be Fact Sheet for Healthcare Providers: https://www.woods-mathews.com/ This test is not yet approved or cleared by the [place] FDA and  has been authorized for detection and/or diagnosis of SARS-CoV-2 by FDA under an Emergency Use Authorization (EUA). This EUA will remain  in effect (meaning this test can be used) for the duration of the COVID-19 declaration under Section 56 4(b)(1) of the Act, 21 U.S.C. section 360bbb-3(b)(1), unless the authorization is terminated or revoked sooner. Performed at [facility], [place] [address], [place], [place] [address]      Time coordinating discharge:  I have spent 35 minutes face to face with the patient and on the ward discussing the patients care, assessment, plan and disposition with other care givers. >50% of the time was devoted counseling the patient about the risks and benefits of treatment/Discharge disposition and coordinating care.   SIGNED:   Damita Lack, MD  [facility] 05/16/2019, 11:42 AM   If 7PM-7AM, please contact night-coverage

## 2019-05-17 ENCOUNTER — Telehealth: Payer: Self-pay | Admitting: *Deleted

## 2019-05-17 ENCOUNTER — Telehealth: Payer: Self-pay | Admitting: Physician Assistant

## 2019-05-17 ENCOUNTER — Ambulatory Visit: Payer: Medicare Other | Admitting: Cardiology

## 2019-05-17 DIAGNOSIS — I110 Hypertensive heart disease with heart failure: Secondary | ICD-10-CM | POA: Diagnosis not present

## 2019-05-17 DIAGNOSIS — I361 Nonrheumatic tricuspid (valve) insufficiency: Secondary | ICD-10-CM | POA: Diagnosis not present

## 2019-05-17 DIAGNOSIS — I5032 Chronic diastolic (congestive) heart failure: Secondary | ICD-10-CM | POA: Diagnosis not present

## 2019-05-17 DIAGNOSIS — E119 Type 2 diabetes mellitus without complications: Secondary | ICD-10-CM | POA: Diagnosis not present

## 2019-05-17 DIAGNOSIS — M6281 Muscle weakness (generalized): Secondary | ICD-10-CM | POA: Diagnosis not present

## 2019-05-17 DIAGNOSIS — R413 Other amnesia: Secondary | ICD-10-CM | POA: Diagnosis not present

## 2019-05-17 DIAGNOSIS — Z170 Estrogen receptor positive status [ER+]: Secondary | ICD-10-CM | POA: Diagnosis not present

## 2019-05-17 DIAGNOSIS — C50311 Malignant neoplasm of lower-inner quadrant of right female breast: Secondary | ICD-10-CM | POA: Diagnosis not present

## 2019-05-17 DIAGNOSIS — Z7984 Long term (current) use of oral hypoglycemic drugs: Secondary | ICD-10-CM | POA: Diagnosis not present

## 2019-05-17 NOTE — Telephone Encounter (Signed)
Patient called says she received a letter from [facility] and would like to know why does not understand.

## 2019-05-17 NOTE — Telephone Encounter (Signed)
Called and spoke to patient.  She is due for an OV with Amy Esterwood. She was recently in the hospital for high blood pressure and has some upcoming visits.  She would be willing to schedule an appt with Amy in April but that schedule is not out yet.  We agreed I will call her in March to schedule an appt with Amy in April.

## 2019-05-17 NOTE — Telephone Encounter (Signed)
Called pt to verify hosp f/u appt that's been made for 05/24/19. Pt states she is aware she had called this morning and appt was made. Ifform had some additional questions concerning discharge completed TCM call [email or web address] Chess  Transition Care Management Follow-up Telephone Call   Date discharged? 05/16/19   How have you been since you were released from the hospital? Pt states she is doing ok    Do you understand why you were in the hospital? YES   Do you understand the discharge instructions? YES   Where were you discharged to? HOME   Items Reviewed:  Medications reviewed: yes, She states they increased her Losartan to 100 mg once a day  Allergies reviewed: YES  Dietary changes reviewed YES  Referrals reviewed: No referral recommeded   Functional Questionnaire:   Activities of Daily Living (ADLs):   She states she are independent in the following: bathing and hygiene, feeding, continence, grooming, toileting and dressing States they require assistance with the following: ambulation   Any transportation issues/concerns?: NO   Any patient concerns? NO   Confirmed importance and date/time of follow-up visits scheduled YES, appt 05/24/19  Provider Appointment booked with Dr. Quay Burow  Confirmed with patient if condition begins to worsen call PCP or go to the ER.  Patient was given the office number and encouraged to call back with question or concerns.  : YES

## 2019-05-18 ENCOUNTER — Telehealth: Payer: Self-pay

## 2019-05-18 ENCOUNTER — Ambulatory Visit: Payer: Medicare Other

## 2019-05-18 NOTE — Telephone Encounter (Signed)
Gave ok to move per Dr. Quay Burow

## 2019-05-18 NOTE — Telephone Encounter (Signed)
New message   OT calling need verbal to reschedule evaluation until next week.

## 2019-05-22 DIAGNOSIS — M6281 Muscle weakness (generalized): Secondary | ICD-10-CM | POA: Diagnosis not present

## 2019-05-22 DIAGNOSIS — Z170 Estrogen receptor positive status [ER+]: Secondary | ICD-10-CM | POA: Diagnosis not present

## 2019-05-22 DIAGNOSIS — I361 Nonrheumatic tricuspid (valve) insufficiency: Secondary | ICD-10-CM | POA: Diagnosis not present

## 2019-05-22 DIAGNOSIS — Z7984 Long term (current) use of oral hypoglycemic drugs: Secondary | ICD-10-CM | POA: Diagnosis not present

## 2019-05-22 DIAGNOSIS — E119 Type 2 diabetes mellitus without complications: Secondary | ICD-10-CM | POA: Diagnosis not present

## 2019-05-22 DIAGNOSIS — I110 Hypertensive heart disease with heart failure: Secondary | ICD-10-CM | POA: Diagnosis not present

## 2019-05-22 DIAGNOSIS — C50311 Malignant neoplasm of lower-inner quadrant of right female breast: Secondary | ICD-10-CM | POA: Diagnosis not present

## 2019-05-22 DIAGNOSIS — I5032 Chronic diastolic (congestive) heart failure: Secondary | ICD-10-CM | POA: Diagnosis not present

## 2019-05-22 DIAGNOSIS — R413 Other amnesia: Secondary | ICD-10-CM | POA: Diagnosis not present

## 2019-05-23 ENCOUNTER — Encounter: Payer: Self-pay | Admitting: Cardiology

## 2019-05-23 ENCOUNTER — Telehealth (INDEPENDENT_AMBULATORY_CARE_PROVIDER_SITE_OTHER): Payer: Medicare Other | Admitting: Cardiology

## 2019-05-23 VITALS — BP 159/78 | HR 62 | Temp 97.8°F | Ht 63.0 in | Wt 125.4 lb

## 2019-05-23 DIAGNOSIS — I160 Hypertensive urgency: Secondary | ICD-10-CM | POA: Diagnosis not present

## 2019-05-23 DIAGNOSIS — Z170 Estrogen receptor positive status [ER+]: Secondary | ICD-10-CM | POA: Diagnosis not present

## 2019-05-23 DIAGNOSIS — Z7984 Long term (current) use of oral hypoglycemic drugs: Secondary | ICD-10-CM | POA: Diagnosis not present

## 2019-05-23 DIAGNOSIS — C50311 Malignant neoplasm of lower-inner quadrant of right female breast: Secondary | ICD-10-CM | POA: Diagnosis not present

## 2019-05-23 DIAGNOSIS — I110 Hypertensive heart disease with heart failure: Secondary | ICD-10-CM | POA: Diagnosis not present

## 2019-05-23 DIAGNOSIS — E119 Type 2 diabetes mellitus without complications: Secondary | ICD-10-CM

## 2019-05-23 DIAGNOSIS — E785 Hyperlipidemia, unspecified: Secondary | ICD-10-CM

## 2019-05-23 DIAGNOSIS — M6281 Muscle weakness (generalized): Secondary | ICD-10-CM | POA: Diagnosis not present

## 2019-05-23 DIAGNOSIS — R413 Other amnesia: Secondary | ICD-10-CM | POA: Diagnosis not present

## 2019-05-23 DIAGNOSIS — I6529 Occlusion and stenosis of unspecified carotid artery: Secondary | ICD-10-CM

## 2019-05-23 DIAGNOSIS — R9431 Abnormal electrocardiogram [ECG] [EKG]: Secondary | ICD-10-CM

## 2019-05-23 DIAGNOSIS — R002 Palpitations: Secondary | ICD-10-CM

## 2019-05-23 DIAGNOSIS — I361 Nonrheumatic tricuspid (valve) insufficiency: Secondary | ICD-10-CM | POA: Diagnosis not present

## 2019-05-23 DIAGNOSIS — I5032 Chronic diastolic (congestive) heart failure: Secondary | ICD-10-CM | POA: Diagnosis not present

## 2019-05-23 DIAGNOSIS — Z853 Personal history of malignant neoplasm of breast: Secondary | ICD-10-CM

## 2019-05-23 NOTE — Patient Instructions (Signed)
Medication Instructions:  Your physician recommends that you continue on your current medications as directed. Please refer to the Current Medication list given to you today.  *If you need a refill on your cardiac medications before your next appointment, please call your pharmacy*  Lab Work: NONE  Testing/Procedures: NONE  Follow-Up: At [facility], you and your health needs are our priority.  As part of our continuing mission to provide you with exceptional heart care, we have created designated Provider Care Teams.  These Care Teams include your primary Cardiologist (physician) and Advanced Practice Providers (APPs -  Physician Assistants and Nurse Practitioners) who all work together to provide you with the care you need, when you need it.  Your next appointment:   3 month(s)  The format for your next appointment:   In Person  Provider:   Kirk Ruths, MD

## 2019-05-23 NOTE — Progress Notes (Signed)
Virtual Visit via Telephone Note   This visit type was conducted due to national recommendations for restrictions regarding the COVID-19 Pandemic (e.g. social distancing) in an effort to limit this patient's exposure and mitigate transmission in our community.  Due to her co-morbid illnesses, this patient is at least at moderate risk for complications without adequate follow up.  This format is felt to be most appropriate for this patient at this time.  The patient did not have access to video technology/had technical difficulties with video requiring transitioning to audio format only (telephone).  All issues noted in this document were discussed and addressed.  No physical exam could be performed with this format.  Please refer to the patient's chart for her  consent to telehealth for [facility].   Date:  05/23/2019   ID:  Tami Jacobson, DOB October 14, 1927, MRN [id number]  Patient Location: Home Provider Location: Home  PCP:  Binnie Rail, MD  Cardiologist:  Dr Stanford Breed Electrophysiologist:  None   Evaluation Performed:  Follow-Up Visit  Chief Complaint:  none  History of Present Illness:    Tami Jacobson is a pleasant 86 y.o. female with a history of difficult to control hypertension, palpitations treated with flecainide, normal coronaries and LV function at catheterization in June 2020, non-insulin-dependent diabetes, and history of breast cancer in 2018.  Patient was contacted today for follow-up.  She was hospitalized February 8 to May 16, 2019 with uncontrolled hypertension.  Her presenting complaints were headaches.  Her losartan was increased.  Echocardiogram during that admission showed an ejection fraction of 55 to 60% with mild LVH and grade 1 diastolic dysfunction.  Since discharge her blood pressures been under better control, she tells me her systolic blood pressure is running in the 130s to 150 range.  Her diastolics are in the 10X and 70s.  The patient lives  alone but has neighbors that check on her.  She has had some blurred vision and double vision, she is going to see an eye doctor next week.  The patient does not have symptoms concerning for COVID-19 infection (fever, chills, cough, or new shortness of breath).    Past Medical History:  Diagnosis Date  . Arthritis    left hip  . Breast cancer, right ([place]) 11/2016  . Family history of breast cancer   . Family history of colon cancer   . Family history of pancreatic cancer   . GERD (gastroesophageal reflux disease)   . Non-insulin dependent type 2 diabetes mellitus ([place])   . PAC (premature atrial contraction)   . Personal history of radiation therapy 12/2016  . Premature ventricular contraction   . Seasonal allergies   . Sensitive skin   . Vitamin B12 deficiency    Past Surgical History:  Procedure Laterality Date  . ABDOMINAL HYSTERECTOMY  1979   partial  . BREAST BIOPSY  07/02/2011   Procedure: BREAST BIOPSY WITH NEEDLE LOCALIZATION;  Surgeon: Shann Medal, MD;  Location: [facility];  Service: General;  Laterality: Left;  left breast atypical hyperplasia needle localization biopsy  . BREAST BIOPSY Left 06/13/2006  . BREAST BIOPSY Right 06/27/2008  . BREAST BIOPSY Right 09/27/2016  . BREAST EXCISIONAL BIOPSY Left 07/02/2011  . BREAST LUMPECTOMY Left 07/04/2006  . BREAST LUMPECTOMY Right 06/26/2008  . BREAST LUMPECTOMY Right 09/27/2016  . BREAST LUMPECTOMY WITH RADIOACTIVE SEED LOCALIZATION Right 11/18/2016   Procedure: RIGHT BREAST LUMPECTOMY WITH RADIOACTIVE SEED LOCALIZATION ERAS PATHWAY;  Surgeon: Alphonsa Overall, MD;  Location: [facility]  [place];  Service: General;  Laterality: Right;  ERAS PATHWAY  . CATARACT EXTRACTION Bilateral   . CHOLECYSTECTOMY, LAPAROSCOPIC  07/07/2001   had appendex taking out at the same time  . ESOPHAGEAL DILATION  2006  . LAPAROSCOPIC APPENDECTOMY  07/07/2001  . TONSILLECTOMY  1942     Current Meds  Medication Sig  . ACCU-CHEK GUIDE test  strip USE AS DIRECTED  . aspirin EC 81 MG tablet Take 81 mg by mouth as needed (for headaches).  . blood glucose meter kit and supplies KIT Dispense based on patient and insurance preference. Check sugars once daily and as needed as directed.  . clobetasol cream (TEMOVATE) 0.63 % Apply 1 application topically as needed (for skin irritation- to affected areas).   . cyanocobalamin (,VITAMIN B-12,) 1000 MCG/ML injection Inject 1,000 mcg into the muscle every 30 (thirty) days.   . diphenhydramine-acetaminophen (TYLENOL PM) 25-500 MG TABS tablet Take 1 tablet by mouth at bedtime as needed (for sleep).  . flecainide (TAMBOCOR) 50 MG tablet TAKE 1 TABLET BY MOUTH TWO  TIMES DAILY (Patient taking differently: Take 50 mg by mouth 2 (two) times daily. )  . losartan (COZAAR) 100 MG tablet Take 1 tablet (100 mg total) by mouth daily.  . metFORMIN (GLUCOPHAGE-XR) 500 MG 24 hr tablet TAKE 1 TABLET BY MOUTH ONCE DAILY WITH BREAKFAST (Patient taking differently: Take 500 mg by mouth daily with breakfast. )  . metoprolol succinate (TOPROL-XL) 50 MG 24 hr tablet Take 1 tablet (50 mg total) by mouth daily. Take with or immediately following a meal.  . naproxen sodium (ANAPROX) 220 MG tablet Take 220 mg by mouth daily as needed (for pain or headaches).   Marland Kitchen omeprazole (PRILOSEC) 20 MG capsule Take 1 capsule (20 mg total) by mouth daily. Please schedule an Office Visit for any further refills.  Thank you (Patient taking differently: Take 20 mg by mouth daily before breakfast. )  . tamoxifen (NOLVADEX) 20 MG tablet TAKE 1 TABLET BY MOUTH  DAILY (Patient taking differently: Take 20 mg by mouth daily. )     Allergies:   Statins, Effexor [venlafaxine], Cephalexin, Codeine, Lansoprazole, Latex, Levofloxacin, Nitrofurantoin, and Rofecoxib   Social History   Tobacco Use  . Smoking status: Never Smoker  . Smokeless tobacco: Never Used  Substance Use Topics  . Alcohol use: No    Alcohol/week: 0.0 standard drinks  . Drug  use: No     Family Hx: The patient's family history includes Breast cancer (age of onset: 53) in an other family member; Breast cancer (age of onset: 40) in her sister; Breast cancer (age of onset: 85) in her sister; Breast cancer (age of onset: 2) in her sister; COPD in her brother; Colon cancer (age of onset: 50) in her mother; Heart attack in her mother; Heart disease in her brother, brother, maternal uncle, and sister; Other in her father; Pancreatic cancer in an other family member.  ROS:   Please see the history of present illness.    All other systems reviewed and are negative.   Prior CV studies:   The following studies were reviewed today: Echo 05/11/2019  Labs/Other Tests and Data Reviewed:    EKG:  An ECG dated 05/16/2019 was personally reviewed today and demonstrated:  NSR-HR 61, incomplete RBBB, LAFB, TWI V2  Recent Labs: 05/14/2019: B Natriuretic Peptide 208.2 05/15/2019: ALT 15; Magnesium 1.7; TSH 2.519 05/16/2019: BUN 11; Creatinine, Ser 0.75; Hemoglobin 11.2; Platelets 123; Potassium 3.8; Sodium 141  Recent Lipid Panel Lab Results  Component Value Date/Time   CHOL 173 02/22/2019 12:04 PM   TRIG 157.0 (H) 02/22/2019 12:04 PM   HDL 50.40 02/22/2019 12:04 PM   CHOLHDL 3 02/22/2019 12:04 PM   LDLCALC 92 02/22/2019 12:04 PM   LDLDIRECT 128.3 12/26/2012 10:03 AM    Wt Readings from Last 3 Encounters:  05/23/19 125 lb 6.4 oz (56.9 kg)  05/16/19 132 lb 7.9 oz (60.1 kg)  05/08/19 135 lb (61.2 kg)     Objective:    Vital Signs:  BP (!) 159/78   Pulse 62   Temp 97.8 F (36.6 C)   Ht '5\' 3"'  (1.6 m)   Wt 125 lb 6.4 oz (56.9 kg)   BMI 22.21 kg/m    VITAL SIGNS:  reviewed  ASSESSMENT & PLAN:    Hypertensive urgency- B/P under acceptable control on increased Losartan dose after recent hospitalization.   Palpitations- On Flecainide 50 mg BID  Normal coronaries- Normal coronaries June 2020- normal LVF  NIDDM- ? Retinopathy- to see ophthalmologist next  week  Dyslipidemia- Statin intolerant  Carotid disease- Mild- 1/39% BICA 2018  Plan:  No change in Rx- she is to see her PCP Monday- f/u labs can be drawn then.  Dr Stanford Breed to see in the office with an EKG in May.   COVID-19 Education: The signs and symptoms of COVID-19 were discussed with the patient and how to seek care for testing (follow up with PCP or arrange E-visit).  The importance of social distancing was discussed today.  Time:   Today, I have spent 15 minutes with the patient with telehealth technology discussing the above problems.     Medication Adjustments/Labs and Tests Ordered: Current medicines are reviewed at length with the patient today.  Concerns regarding medicines are outlined above.   Tests Ordered: No orders of the defined types were placed in this encounter.   Medication Changes: No orders of the defined types were placed in this encounter.   Follow Up:  In Person Dr Stanford Breed in May with an EKG  Signed, Kerin Ransom, [place]  05/23/2019 9:53 AM    [place]

## 2019-05-24 ENCOUNTER — Inpatient Hospital Stay: Payer: Medicare Other | Admitting: Internal Medicine

## 2019-05-25 NOTE — Progress Notes (Signed)
Subjective:    Patient ID: Tami Jacobson, female    DOB: Aug 07, 1927, 86 y.o.   MRN: [id number]  HPI The patient is here for follow up from the hospital.  Admitted 05/15/2019 - 05/16/2019.   She went to the ED after being advised by [place] to go for elevated BP x 1 week associated with headaches and blurred vision. I did add losartan 50 mg daily prior to her going to the ED and advised she continue her metoprolol 50 mg daily.  Her BP was still elevated at home 184/81 - 206/92.  She had blurred vision, lightheadedness and was advised to go to the ED.  She denied chest pain.    In the ED a CT of her head was unremarkable.  Troponin was neg and there was no evidence of AKI.  She was initially started on Cardene drip and was weaned off in ED.  She was placed on hydralazine prn.  BP went down to 152/54.  She was admitted to stepdown.   Her losartan was increased to 100 mg daily.  Her headaches and dizziness improved.    Hypertensive urgency:  Hypertension urgency resolved, essential hypertension better controlled Losartan inc to 100 mg daily Continued on toprol xl 50 mg  Ct of head neg Advised 2 g salt diet  PVC's: Stable on flecainide  Other medication problems stable   She is taking all of her medications as prescribed.  Her BP has been 120-150's for the most part at home.  Today it was 149/68 before she came here .  One day it was 160/86, but that was the only time it was that high.  Typically - 129/66-147/70.  She denies any blurry vision, headaches or dizziness since then.  She has had some intermittent lightheadedness.  She does have an eye appointment to make sure her vision is okay.  She was seen in January for her routine exam and everything was okay at that time.  She still has an achy feeling above her left ear.  It is tender to palpation and she can also feel pain in her posterior lower skull/neck region.     Medications and allergies reviewed with patient and updated if  appropriate.  Patient Active Problem List   Diagnosis Date Noted  . Palpitations 05/23/2019  . Chronic diastolic CHF (congestive heart failure) ([place]) 05/14/2019  . Hypertensive urgency 05/14/2019  . Mild tricuspid regurgitation 05/13/2019  . LVH (left ventricular hypertrophy), mild 05/13/2019  . Memory difficulty 12/05/2018  . Genetic testing 08/30/2017  . Depression 06/01/2017  . Family history of breast cancer   . Family history of colon cancer   . Family history of pancreatic cancer   . Malignant neoplasm of lower-inner quadrant of right breast of female, estrogen receptor positive ([place]) 10/28/2016  . Abnormal electrocardiogram 05/05/2015  . BPPV (benign paroxysmal positional vertigo) 10/01/2014  . Murmur 04/18/2014  . Anal fissure 12/16/2011  . Fibrocystic breast changes. Left.  Biopsy 07/02/2011. 07/14/2011  . History of breast cancer, Right, T1c, N0, lumpectomy 06/26/2008.   Left, T1c, N0, lumpectomy 06/24/2006. 05/12/2011  . Diabetes mellitus type 2, controlled ([place]) 06/11/2009  . PREMATURE VENTRICULAR CONTRACTIONS 08/19/2008  . B12 deficiency 08/29/2007  . ESOPHAGEAL STRICTURE 04/27/2007  . GERD (gastroesophageal reflux disease) 04/27/2007  . DIVERTICULOSIS, COLON 04/27/2007  . OTHER ALOPECIA 02/14/2007  . Dyslipidemia 01/02/2007  . Essential hypertension 01/02/2007  . Carotid stenosis 08/22/2006    Current Outpatient Medications on File Prior to Visit  Medication Sig Dispense Refill  . ACCU-CHEK GUIDE test strip USE AS DIRECTED 100 strip 3  . aspirin EC 81 MG tablet Take 81 mg by mouth as needed (for headaches).    . blood glucose meter kit and supplies KIT Dispense based on patient and insurance preference. Check sugars once daily and as needed as directed. 1 each 0  . clobetasol cream (TEMOVATE) 1.44 % Apply 1 application topically as needed (for skin irritation- to affected areas).     . cyanocobalamin (,VITAMIN B-12,) 1000 MCG/ML injection Inject 1,000 mcg into the  muscle every 30 (thirty) days.     . diphenhydramine-acetaminophen (TYLENOL PM) 25-500 MG TABS tablet Take 1 tablet by mouth at bedtime as needed (for sleep).    . flecainide (TAMBOCOR) 50 MG tablet TAKE 1 TABLET BY MOUTH TWO  TIMES DAILY (Patient taking differently: Take 50 mg by mouth 2 (two) times daily. ) 180 tablet 3  . losartan (COZAAR) 100 MG tablet Take 1 tablet (100 mg total) by mouth daily. 30 tablet 0  . metFORMIN (GLUCOPHAGE-XR) 500 MG 24 hr tablet TAKE 1 TABLET BY MOUTH ONCE DAILY WITH BREAKFAST (Patient taking differently: Take 500 mg by mouth daily with breakfast. ) 90 tablet 3  . metoprolol succinate (TOPROL-XL) 50 MG 24 hr tablet Take 1 tablet (50 mg total) by mouth daily. Take with or immediately following a meal. 30 tablet 0  . naproxen sodium (ANAPROX) 220 MG tablet Take 220 mg by mouth daily as needed (for pain or headaches).     Marland Kitchen omeprazole (PRILOSEC) 20 MG capsule Take 1 capsule (20 mg total) by mouth daily. Please schedule an Office Visit for any further refills.  Thank you (Patient taking differently: Take 20 mg by mouth daily before breakfast. ) 90 capsule 0  . tamoxifen (NOLVADEX) 20 MG tablet TAKE 1 TABLET BY MOUTH  DAILY (Patient taking differently: Take 20 mg by mouth daily. ) 90 tablet 3   No current facility-administered medications on file prior to visit.    Past Medical History:  Diagnosis Date  . Arthritis    left hip  . Breast cancer, right ([place]) 11/2016  . Family history of breast cancer   . Family history of colon cancer   . Family history of pancreatic cancer   . GERD (gastroesophageal reflux disease)   . Non-insulin dependent type 2 diabetes mellitus ([place])   . PAC (premature atrial contraction)   . Personal history of radiation therapy 12/2016  . Premature ventricular contraction   . Seasonal allergies   . Sensitive skin   . Vitamin B12 deficiency     Past Surgical History:  Procedure Laterality Date  . ABDOMINAL HYSTERECTOMY  1979   partial    . BREAST BIOPSY  07/02/2011   Procedure: BREAST BIOPSY WITH NEEDLE LOCALIZATION;  Surgeon: Shann Medal, MD;  Location: [facility];  Service: General;  Laterality: Left;  left breast atypical hyperplasia needle localization biopsy  . BREAST BIOPSY Left 06/13/2006  . BREAST BIOPSY Right 06/27/2008  . BREAST BIOPSY Right 09/27/2016  . BREAST EXCISIONAL BIOPSY Left 07/02/2011  . BREAST LUMPECTOMY Left 07/04/2006  . BREAST LUMPECTOMY Right 06/26/2008  . BREAST LUMPECTOMY Right 09/27/2016  . BREAST LUMPECTOMY WITH RADIOACTIVE SEED LOCALIZATION Right 11/18/2016   Procedure: RIGHT BREAST LUMPECTOMY WITH RADIOACTIVE SEED LOCALIZATION ERAS PATHWAY;  Surgeon: Alphonsa Overall, MD;  Location: [facility];  Service: General;  Laterality: Right;  ERAS PATHWAY  . CATARACT EXTRACTION Bilateral   . CHOLECYSTECTOMY,  LAPAROSCOPIC  07/07/2001   had appendex taking out at the same time  . ESOPHAGEAL DILATION  2006  . LAPAROSCOPIC APPENDECTOMY  07/07/2001  . TONSILLECTOMY  1942    Social History   Socioeconomic History  . Marital status: Divorced    Spouse name: Marcello Moores  . Number of children: 1  . Years of education: Not on file  . Highest education level: Not on file  Occupational History  . Occupation: retire  Tobacco Use  . Smoking status: Never Smoker  . Smokeless tobacco: Never Used  Substance and Sexual Activity  . Alcohol use: No    Alcohol/week: 0.0 standard drinks  . Drug use: No  . Sexual activity: Not Currently  Other Topics Concern  . Not on file  Social History Narrative  . Not on file   Social Determinants of Health   Financial Resource Strain:   . Difficulty of Paying Living Expenses: Not on file  Food Insecurity:   . Worried About Charity fundraiser in the Last Year: Not on file  . Ran Out of Food in the Last Year: Not on file  Transportation Needs:   . Lack of Transportation (Medical): Not on file  . Lack of Transportation (Non-Medical): Not on file  Physical  Activity: Insufficiently Active  . Days of Exercise per Week: 2 days  . Minutes of Exercise per Session: 30 min  Stress:   . Feeling of Stress : Not on file  Social Connections:   . Frequency of Communication with Friends and Family: Not on file  . Frequency of Social Gatherings with Friends and Family: Not on file  . Attends Religious Services: Not on file  . Active Member of Clubs or Organizations: Not on file  . Attends Archivist Meetings: Not on file  . Marital Status: Not on file    Family History  Problem Relation Age of Onset  . Colon cancer Mother 34  . Heart attack Mother   . Breast cancer Sister 39  . Heart disease Maternal Uncle        CABG  . Breast cancer Sister 36  . Heart disease Brother   . Other Father        suicide  . Breast cancer Sister 21  . Heart disease Sister   . COPD Brother   . Heart disease Brother   . Breast cancer Other 55       niece - brother's daughter  . Pancreatic cancer Other        brother's son, dx in his 57s    Review of Systems  Constitutional: Negative for fever.  Eyes: Negative for visual disturbance.  Respiratory: Negative for cough, shortness of breath and wheezing.   Cardiovascular: Negative for chest pain, palpitations and leg swelling.  Neurological: Positive for light-headedness (a little at times). Negative for dizziness and headaches.       Objective:   Vitals:   05/28/19 1116  BP: (!) 154/72  Pulse: 63  Resp: 14  Temp: 98.3 F (36.8 C)  SpO2: 97%   BP Readings from Last 3 Encounters:  05/28/19 (!) 154/72  05/23/19 (!) 159/78  05/16/19 (!) 152/41   Wt Readings from Last 3 Encounters:  05/28/19 131 lb (59.4 kg)  05/23/19 125 lb 6.4 oz (56.9 kg)  05/16/19 132 lb 7.9 oz (60.1 kg)   Body mass index is 23.21 kg/m.   Physical Exam    Constitutional: Appears well-developed and well-nourished. No distress.  HENT:  Head: Normocephalic and atraumatic.  Neck: Neck supple. No tracheal deviation  present. No thyromegaly present.  No cervical lymphadenopathy Cardiovascular: Normal rate, regular rhythm and normal heart sounds.   2/6 systolic murmur heard.  Right carotid bruit .  No edema Pulmonary/Chest: Effort normal and breath sounds normal. No respiratory distress. No has no wheezes. No rales.  Skin: Skin is warm and dry. Not diaphoretic.  Psychiatric: Normal mood and affect. Behavior is normal.   Lab Results  Component Value Date   WBC 5.2 05/16/2019   HGB 11.2 (L) 05/16/2019   HCT 33.2 (L) 05/16/2019   PLT 123 (L) 05/16/2019   GLUCOSE 139 (H) 05/16/2019   CHOL 173 02/22/2019   TRIG 157.0 (H) 02/22/2019   HDL 50.40 02/22/2019   LDLDIRECT 128.3 12/26/2012   LDLCALC 92 02/22/2019   ALT 15 05/15/2019   AST 16 05/15/2019   NA 141 05/16/2019   K 3.8 05/16/2019   CL 104 05/16/2019   CREATININE 0.75 05/16/2019   BUN 11 05/16/2019   CO2 27 05/16/2019   TSH 2.519 05/15/2019   HGBA1C 6.7 (H) 05/15/2019   MICROALBUR 1.0 08/23/2018    DG CHEST PORT 1 VIEW CLINICAL DATA:  Hypertension  EXAM: PORTABLE CHEST 1 VIEW  COMPARISON:  02/20/2015  FINDINGS: Heart and mediastinal contours are within normal limits. No focal opacities or effusions. No acute bony abnormality.  IMPRESSION: No active disease.  Electronically Signed   By: Rolm Baptise M.D.   On: 05/14/2019 22:53 CT Head Wo Contrast CLINICAL DATA:  86 year old female with headache.  EXAM: CT HEAD WITHOUT CONTRAST  TECHNIQUE: Contiguous axial images were obtained from the base of the skull through the vertex without intravenous contrast.  COMPARISON:  Head CT dated 12/24/2009.  FINDINGS: Brain: There is moderate age-related atrophy and chronic microvascular ischemic changes. There is no acute intracranial hemorrhage. No mass effect or midline shift. No extra-axial fluid collection.  Vascular: No hyperdense vessel or unexpected calcification.  Skull: Normal. Negative for fracture or focal  lesion.  Sinuses/Orbits: No acute finding.  Other: None  IMPRESSION: 1. No acute intracranial hemorrhage. 2. Age-related atrophy and chronic microvascular ischemic changes.  Electronically Signed   By: Anner Crete M.D.   On: 05/14/2019 19:30    Assessment & Plan:    See Problem List for Assessment and Plan of chronic medical problems.    This visit occurred during the SARS-CoV-2 public health emergency.  Safety protocols were in place, including screening questions prior to the visit, additional usage of staff PPE, and extensive cleaning of exam room while observing appropriate contact time as indicated for disinfecting solutions.

## 2019-05-26 DIAGNOSIS — I110 Hypertensive heart disease with heart failure: Secondary | ICD-10-CM | POA: Diagnosis not present

## 2019-05-26 DIAGNOSIS — M6281 Muscle weakness (generalized): Secondary | ICD-10-CM | POA: Diagnosis not present

## 2019-05-26 DIAGNOSIS — Z170 Estrogen receptor positive status [ER+]: Secondary | ICD-10-CM | POA: Diagnosis not present

## 2019-05-26 DIAGNOSIS — R413 Other amnesia: Secondary | ICD-10-CM | POA: Diagnosis not present

## 2019-05-26 DIAGNOSIS — Z7984 Long term (current) use of oral hypoglycemic drugs: Secondary | ICD-10-CM | POA: Diagnosis not present

## 2019-05-26 DIAGNOSIS — E119 Type 2 diabetes mellitus without complications: Secondary | ICD-10-CM | POA: Diagnosis not present

## 2019-05-26 DIAGNOSIS — I5032 Chronic diastolic (congestive) heart failure: Secondary | ICD-10-CM | POA: Diagnosis not present

## 2019-05-26 DIAGNOSIS — I361 Nonrheumatic tricuspid (valve) insufficiency: Secondary | ICD-10-CM | POA: Diagnosis not present

## 2019-05-26 DIAGNOSIS — C50311 Malignant neoplasm of lower-inner quadrant of right female breast: Secondary | ICD-10-CM | POA: Diagnosis not present

## 2019-05-28 ENCOUNTER — Other Ambulatory Visit: Payer: Self-pay

## 2019-05-28 ENCOUNTER — Ambulatory Visit (INDEPENDENT_AMBULATORY_CARE_PROVIDER_SITE_OTHER): Payer: Medicare Other | Admitting: Internal Medicine

## 2019-05-28 ENCOUNTER — Encounter: Payer: Self-pay | Admitting: Internal Medicine

## 2019-05-28 VITALS — BP 154/72 | HR 63 | Temp 98.3°F | Resp 14 | Ht 63.0 in | Wt 131.0 lb

## 2019-05-28 DIAGNOSIS — I10 Essential (primary) hypertension: Secondary | ICD-10-CM

## 2019-05-28 DIAGNOSIS — I361 Nonrheumatic tricuspid (valve) insufficiency: Secondary | ICD-10-CM

## 2019-05-28 DIAGNOSIS — Z7984 Long term (current) use of oral hypoglycemic drugs: Secondary | ICD-10-CM

## 2019-05-28 DIAGNOSIS — H8110 Benign paroxysmal vertigo, unspecified ear: Secondary | ICD-10-CM

## 2019-05-28 DIAGNOSIS — R413 Other amnesia: Secondary | ICD-10-CM

## 2019-05-28 DIAGNOSIS — M6281 Muscle weakness (generalized): Secondary | ICD-10-CM | POA: Diagnosis not present

## 2019-05-28 DIAGNOSIS — Z170 Estrogen receptor positive status [ER+]: Secondary | ICD-10-CM

## 2019-05-28 DIAGNOSIS — F329 Major depressive disorder, single episode, unspecified: Secondary | ICD-10-CM

## 2019-05-28 DIAGNOSIS — C50311 Malignant neoplasm of lower-inner quadrant of right female breast: Secondary | ICD-10-CM | POA: Diagnosis not present

## 2019-05-28 DIAGNOSIS — I6529 Occlusion and stenosis of unspecified carotid artery: Secondary | ICD-10-CM

## 2019-05-28 DIAGNOSIS — I5032 Chronic diastolic (congestive) heart failure: Secondary | ICD-10-CM | POA: Diagnosis not present

## 2019-05-28 DIAGNOSIS — E119 Type 2 diabetes mellitus without complications: Secondary | ICD-10-CM

## 2019-05-28 DIAGNOSIS — I110 Hypertensive heart disease with heart failure: Secondary | ICD-10-CM | POA: Diagnosis not present

## 2019-05-28 DIAGNOSIS — I160 Hypertensive urgency: Secondary | ICD-10-CM

## 2019-05-28 LAB — CBC WITH DIFFERENTIAL/PLATELET
Basophils Absolute: 0 10*3/uL (ref 0.0–0.1)
Basophils Relative: 0.5 % (ref 0.0–3.0)
Eosinophils Absolute: 0 10*3/uL (ref 0.0–0.7)
Eosinophils Relative: 0.6 % (ref 0.0–5.0)
HCT: 34.2 % — ABNORMAL LOW (ref 36.0–46.0)
Hemoglobin: 11.8 g/dL — ABNORMAL LOW (ref 12.0–15.0)
Lymphocytes Relative: 21.7 % (ref 12.0–46.0)
Lymphs Abs: 1.6 10*3/uL (ref 0.7–4.0)
MCHC: 34.4 g/dL (ref 30.0–36.0)
MCV: 94.9 fl (ref 78.0–100.0)
Monocytes Absolute: 0.5 10*3/uL (ref 0.1–1.0)
Monocytes Relative: 7.4 % (ref 3.0–12.0)
Neutro Abs: 5 10*3/uL (ref 1.4–7.7)
Neutrophils Relative %: 69.8 % (ref 43.0–77.0)
Platelets: 146 10*3/uL — ABNORMAL LOW (ref 150.0–400.0)
RBC: 3.6 Mil/uL — ABNORMAL LOW (ref 3.87–5.11)
RDW: 13.9 % (ref 11.5–15.5)
WBC: 7.2 10*3/uL (ref 4.0–10.5)

## 2019-05-28 LAB — BASIC METABOLIC PANEL
BUN: 17 mg/dL (ref 6–23)
CO2: 29 mEq/L (ref 19–32)
Calcium: 9.9 mg/dL (ref 8.4–10.5)
Chloride: 102 mEq/L (ref 96–112)
Creatinine, Ser: 1.1 mg/dL (ref 0.40–1.20)
GFR: 46.7 mL/min — ABNORMAL LOW (ref >60.00)
Glucose, Bld: 164 mg/dL — ABNORMAL HIGH (ref 70–99)
Potassium: 4.4 mEq/L (ref 3.5–5.1)
Sodium: 138 mEq/L (ref 135–145)

## 2019-05-28 NOTE — Assessment & Plan Note (Signed)
Resolved Medication adjusted while she was in the hospital and blood pressure overall better controlled She will continue to monitor at home

## 2019-05-28 NOTE — Patient Instructions (Signed)
  Blood work was ordered.     Medications reviewed and updated.  Changes include :   none    Please followup in May as scheduled.

## 2019-05-28 NOTE — Assessment & Plan Note (Signed)
Chronic, but with hypertensive urgency earlier this month for which she was hospitalized Losartan dose increased to 100 mg daily Metoprolol continued 50 mg daily She is taking her blood pressure at home and overall it seems fairly controlled.  I will not make any changes today because I do not want to over control her blood pressure given her age She will continue to monitor her BP at home and let me know if there are any concerns BMP, CBC today Has follow-up with cardiology and myself in 3 months, but she will come in sooner if her blood pressure is not controlled Advised low-salt diet and keeping active

## 2019-05-29 DIAGNOSIS — I110 Hypertensive heart disease with heart failure: Secondary | ICD-10-CM | POA: Diagnosis not present

## 2019-05-29 DIAGNOSIS — Z7984 Long term (current) use of oral hypoglycemic drugs: Secondary | ICD-10-CM | POA: Diagnosis not present

## 2019-05-29 DIAGNOSIS — E119 Type 2 diabetes mellitus without complications: Secondary | ICD-10-CM | POA: Diagnosis not present

## 2019-05-29 DIAGNOSIS — R413 Other amnesia: Secondary | ICD-10-CM | POA: Diagnosis not present

## 2019-05-29 DIAGNOSIS — M6281 Muscle weakness (generalized): Secondary | ICD-10-CM | POA: Diagnosis not present

## 2019-05-29 DIAGNOSIS — I361 Nonrheumatic tricuspid (valve) insufficiency: Secondary | ICD-10-CM | POA: Diagnosis not present

## 2019-05-29 DIAGNOSIS — I5032 Chronic diastolic (congestive) heart failure: Secondary | ICD-10-CM | POA: Diagnosis not present

## 2019-05-29 DIAGNOSIS — Z170 Estrogen receptor positive status [ER+]: Secondary | ICD-10-CM | POA: Diagnosis not present

## 2019-05-29 DIAGNOSIS — C50311 Malignant neoplasm of lower-inner quadrant of right female breast: Secondary | ICD-10-CM | POA: Diagnosis not present

## 2019-05-31 ENCOUNTER — Telehealth: Payer: Self-pay

## 2019-05-31 DIAGNOSIS — H10413 Chronic giant papillary conjunctivitis, bilateral: Secondary | ICD-10-CM | POA: Diagnosis not present

## 2019-05-31 NOTE — Telephone Encounter (Signed)
New message   The patient calls want to discuss an eye appointment she had today @ 10:30 am with the nurse.   No further detail was given.

## 2019-06-01 DIAGNOSIS — M6281 Muscle weakness (generalized): Secondary | ICD-10-CM | POA: Diagnosis not present

## 2019-06-01 DIAGNOSIS — Z170 Estrogen receptor positive status [ER+]: Secondary | ICD-10-CM | POA: Diagnosis not present

## 2019-06-01 DIAGNOSIS — I110 Hypertensive heart disease with heart failure: Secondary | ICD-10-CM | POA: Diagnosis not present

## 2019-06-01 DIAGNOSIS — I5032 Chronic diastolic (congestive) heart failure: Secondary | ICD-10-CM | POA: Diagnosis not present

## 2019-06-01 DIAGNOSIS — Z7984 Long term (current) use of oral hypoglycemic drugs: Secondary | ICD-10-CM | POA: Diagnosis not present

## 2019-06-01 DIAGNOSIS — I361 Nonrheumatic tricuspid (valve) insufficiency: Secondary | ICD-10-CM | POA: Diagnosis not present

## 2019-06-01 DIAGNOSIS — C50311 Malignant neoplasm of lower-inner quadrant of right female breast: Secondary | ICD-10-CM | POA: Diagnosis not present

## 2019-06-01 DIAGNOSIS — E119 Type 2 diabetes mellitus without complications: Secondary | ICD-10-CM | POA: Diagnosis not present

## 2019-06-01 DIAGNOSIS — R413 Other amnesia: Secondary | ICD-10-CM | POA: Diagnosis not present

## 2019-06-01 NOTE — Telephone Encounter (Signed)
LVM for pt to call back in regards.  

## 2019-06-01 NOTE — Telephone Encounter (Signed)
Spoke with pt and she advise that her eye exam went well.

## 2019-06-01 NOTE — Telephone Encounter (Signed)
New Message:   Pt returning call from VM left.

## 2019-06-06 ENCOUNTER — Other Ambulatory Visit: Payer: Self-pay

## 2019-06-06 ENCOUNTER — Ambulatory Visit (INDEPENDENT_AMBULATORY_CARE_PROVIDER_SITE_OTHER): Payer: Medicare Other | Admitting: *Deleted

## 2019-06-06 DIAGNOSIS — I361 Nonrheumatic tricuspid (valve) insufficiency: Secondary | ICD-10-CM | POA: Diagnosis not present

## 2019-06-06 DIAGNOSIS — Z170 Estrogen receptor positive status [ER+]: Secondary | ICD-10-CM | POA: Diagnosis not present

## 2019-06-06 DIAGNOSIS — R413 Other amnesia: Secondary | ICD-10-CM | POA: Diagnosis not present

## 2019-06-06 DIAGNOSIS — M6281 Muscle weakness (generalized): Secondary | ICD-10-CM | POA: Diagnosis not present

## 2019-06-06 DIAGNOSIS — C50311 Malignant neoplasm of lower-inner quadrant of right female breast: Secondary | ICD-10-CM | POA: Diagnosis not present

## 2019-06-06 DIAGNOSIS — E119 Type 2 diabetes mellitus without complications: Secondary | ICD-10-CM | POA: Diagnosis not present

## 2019-06-06 DIAGNOSIS — I110 Hypertensive heart disease with heart failure: Secondary | ICD-10-CM | POA: Diagnosis not present

## 2019-06-06 DIAGNOSIS — E538 Deficiency of other specified B group vitamins: Secondary | ICD-10-CM

## 2019-06-06 DIAGNOSIS — Z7984 Long term (current) use of oral hypoglycemic drugs: Secondary | ICD-10-CM | POA: Diagnosis not present

## 2019-06-06 DIAGNOSIS — I5032 Chronic diastolic (congestive) heart failure: Secondary | ICD-10-CM | POA: Diagnosis not present

## 2019-06-06 MED ORDER — CYANOCOBALAMIN 1000 MCG/ML IJ SOLN
1000.0000 ug | Freq: Once | INTRAMUSCULAR | Status: AC
  Administered 2019-06-06: 1000 ug via INTRAMUSCULAR

## 2019-06-06 NOTE — Progress Notes (Signed)
Pls cosign for b12 inj../l;mb 

## 2019-06-06 NOTE — Progress Notes (Signed)
b12 Injection given.   Ferron Ishmael J Darcia Lampi, MD  

## 2019-06-08 DIAGNOSIS — I5032 Chronic diastolic (congestive) heart failure: Secondary | ICD-10-CM | POA: Diagnosis not present

## 2019-06-08 DIAGNOSIS — Z170 Estrogen receptor positive status [ER+]: Secondary | ICD-10-CM | POA: Diagnosis not present

## 2019-06-08 DIAGNOSIS — I110 Hypertensive heart disease with heart failure: Secondary | ICD-10-CM | POA: Diagnosis not present

## 2019-06-08 DIAGNOSIS — E119 Type 2 diabetes mellitus without complications: Secondary | ICD-10-CM | POA: Diagnosis not present

## 2019-06-08 DIAGNOSIS — Z7984 Long term (current) use of oral hypoglycemic drugs: Secondary | ICD-10-CM | POA: Diagnosis not present

## 2019-06-08 DIAGNOSIS — R413 Other amnesia: Secondary | ICD-10-CM | POA: Diagnosis not present

## 2019-06-08 DIAGNOSIS — M6281 Muscle weakness (generalized): Secondary | ICD-10-CM | POA: Diagnosis not present

## 2019-06-08 DIAGNOSIS — C50311 Malignant neoplasm of lower-inner quadrant of right female breast: Secondary | ICD-10-CM | POA: Diagnosis not present

## 2019-06-08 DIAGNOSIS — I361 Nonrheumatic tricuspid (valve) insufficiency: Secondary | ICD-10-CM | POA: Diagnosis not present

## 2019-06-12 DIAGNOSIS — L218 Other seborrheic dermatitis: Secondary | ICD-10-CM | POA: Diagnosis not present

## 2019-06-12 DIAGNOSIS — L821 Other seborrheic keratosis: Secondary | ICD-10-CM | POA: Diagnosis not present

## 2019-06-12 DIAGNOSIS — L820 Inflamed seborrheic keratosis: Secondary | ICD-10-CM | POA: Diagnosis not present

## 2019-06-12 DIAGNOSIS — Z85828 Personal history of other malignant neoplasm of skin: Secondary | ICD-10-CM | POA: Diagnosis not present

## 2019-06-12 DIAGNOSIS — L570 Actinic keratosis: Secondary | ICD-10-CM | POA: Diagnosis not present

## 2019-06-14 DIAGNOSIS — C50311 Malignant neoplasm of lower-inner quadrant of right female breast: Secondary | ICD-10-CM | POA: Diagnosis not present

## 2019-06-14 DIAGNOSIS — Z7984 Long term (current) use of oral hypoglycemic drugs: Secondary | ICD-10-CM | POA: Diagnosis not present

## 2019-06-14 DIAGNOSIS — Z170 Estrogen receptor positive status [ER+]: Secondary | ICD-10-CM | POA: Diagnosis not present

## 2019-06-14 DIAGNOSIS — M6281 Muscle weakness (generalized): Secondary | ICD-10-CM | POA: Diagnosis not present

## 2019-06-14 DIAGNOSIS — I361 Nonrheumatic tricuspid (valve) insufficiency: Secondary | ICD-10-CM | POA: Diagnosis not present

## 2019-06-14 DIAGNOSIS — E119 Type 2 diabetes mellitus without complications: Secondary | ICD-10-CM | POA: Diagnosis not present

## 2019-06-14 DIAGNOSIS — I110 Hypertensive heart disease with heart failure: Secondary | ICD-10-CM | POA: Diagnosis not present

## 2019-06-14 DIAGNOSIS — I5032 Chronic diastolic (congestive) heart failure: Secondary | ICD-10-CM | POA: Diagnosis not present

## 2019-06-14 DIAGNOSIS — R413 Other amnesia: Secondary | ICD-10-CM | POA: Diagnosis not present

## 2019-06-18 ENCOUNTER — Telehealth: Payer: Self-pay | Admitting: Internal Medicine

## 2019-06-18 MED ORDER — LOSARTAN POTASSIUM 100 MG PO TABS: 100.0000 mg | ORAL_TABLET | Freq: Every day | ORAL | 1 refills | Status: DC

## 2019-06-18 NOTE — Telephone Encounter (Signed)
New message:   1.Medication Requested: losartan (COZAAR) 100 MG tablet 2. Pharmacy (Name, Street, [place]): [facility] [place] 3. On Med List: Yes  4. Last Visit with PCP: 05/28/19  5. Next visit date with PCP:08/22/19   Agent: Please be advised that RX refills may take up to 3 business days. We ask that you follow-up with your pharmacy.

## 2019-06-18 NOTE — Telephone Encounter (Signed)
Rx sent 

## 2019-06-19 ENCOUNTER — Telehealth: Payer: Self-pay

## 2019-06-19 DIAGNOSIS — I110 Hypertensive heart disease with heart failure: Secondary | ICD-10-CM | POA: Diagnosis not present

## 2019-06-19 DIAGNOSIS — I361 Nonrheumatic tricuspid (valve) insufficiency: Secondary | ICD-10-CM | POA: Diagnosis not present

## 2019-06-19 DIAGNOSIS — Z7984 Long term (current) use of oral hypoglycemic drugs: Secondary | ICD-10-CM | POA: Diagnosis not present

## 2019-06-19 DIAGNOSIS — C50311 Malignant neoplasm of lower-inner quadrant of right female breast: Secondary | ICD-10-CM | POA: Diagnosis not present

## 2019-06-19 DIAGNOSIS — I5032 Chronic diastolic (congestive) heart failure: Secondary | ICD-10-CM | POA: Diagnosis not present

## 2019-06-19 DIAGNOSIS — Z170 Estrogen receptor positive status [ER+]: Secondary | ICD-10-CM | POA: Diagnosis not present

## 2019-06-19 DIAGNOSIS — M6281 Muscle weakness (generalized): Secondary | ICD-10-CM | POA: Diagnosis not present

## 2019-06-19 DIAGNOSIS — E119 Type 2 diabetes mellitus without complications: Secondary | ICD-10-CM | POA: Diagnosis not present

## 2019-06-19 DIAGNOSIS — R413 Other amnesia: Secondary | ICD-10-CM | POA: Diagnosis not present

## 2019-06-19 NOTE — Telephone Encounter (Signed)
-----   Message from Roetta Sessions, [place] sent at 06/12/2019  5:37 PM EST ----- Regarding: FW: needs appt with Amy E in April  ----- Message ----- From: Roetta Sessions, CMA Sent: 06/12/2019 To: Roetta Sessions, CMA Subject: needs appt with Army E in April                Call pt to schedule a F/U appt with Amy in March or April for Gerd/Med refil  From 05-17-19:  Called and spoke to patient.  She is due for an OV with Amy Esterwood. She was recently in the hospital for high blood pressure and has some upcoming visits.  She would be willing to schedule an appt with Amy in April but that schedule is not out yet.  We agreed I will call her in March to schedule an appt with Amy in April.

## 2019-06-19 NOTE — Telephone Encounter (Signed)
Called and spoke to patient.  Scheduled her for an OV with Amy Esterwood in April.

## 2019-06-26 DIAGNOSIS — M6281 Muscle weakness (generalized): Secondary | ICD-10-CM | POA: Diagnosis not present

## 2019-06-26 DIAGNOSIS — I110 Hypertensive heart disease with heart failure: Secondary | ICD-10-CM | POA: Diagnosis not present

## 2019-06-26 DIAGNOSIS — Z7984 Long term (current) use of oral hypoglycemic drugs: Secondary | ICD-10-CM | POA: Diagnosis not present

## 2019-06-26 DIAGNOSIS — Z170 Estrogen receptor positive status [ER+]: Secondary | ICD-10-CM | POA: Diagnosis not present

## 2019-06-26 DIAGNOSIS — R413 Other amnesia: Secondary | ICD-10-CM | POA: Diagnosis not present

## 2019-06-26 DIAGNOSIS — I361 Nonrheumatic tricuspid (valve) insufficiency: Secondary | ICD-10-CM | POA: Diagnosis not present

## 2019-06-26 DIAGNOSIS — E119 Type 2 diabetes mellitus without complications: Secondary | ICD-10-CM | POA: Diagnosis not present

## 2019-06-26 DIAGNOSIS — C50311 Malignant neoplasm of lower-inner quadrant of right female breast: Secondary | ICD-10-CM | POA: Diagnosis not present

## 2019-06-26 DIAGNOSIS — I5032 Chronic diastolic (congestive) heart failure: Secondary | ICD-10-CM | POA: Diagnosis not present

## 2019-06-27 DIAGNOSIS — H16223 Keratoconjunctivitis sicca, not specified as Sjogren's, bilateral: Secondary | ICD-10-CM | POA: Diagnosis not present

## 2019-06-30 DIAGNOSIS — Z23 Encounter for immunization: Secondary | ICD-10-CM | POA: Diagnosis not present

## 2019-07-05 DIAGNOSIS — E119 Type 2 diabetes mellitus without complications: Secondary | ICD-10-CM | POA: Diagnosis not present

## 2019-07-05 DIAGNOSIS — Z170 Estrogen receptor positive status [ER+]: Secondary | ICD-10-CM | POA: Diagnosis not present

## 2019-07-05 DIAGNOSIS — I361 Nonrheumatic tricuspid (valve) insufficiency: Secondary | ICD-10-CM | POA: Diagnosis not present

## 2019-07-05 DIAGNOSIS — I5032 Chronic diastolic (congestive) heart failure: Secondary | ICD-10-CM | POA: Diagnosis not present

## 2019-07-05 DIAGNOSIS — I110 Hypertensive heart disease with heart failure: Secondary | ICD-10-CM | POA: Diagnosis not present

## 2019-07-05 DIAGNOSIS — R413 Other amnesia: Secondary | ICD-10-CM | POA: Diagnosis not present

## 2019-07-05 DIAGNOSIS — C50311 Malignant neoplasm of lower-inner quadrant of right female breast: Secondary | ICD-10-CM | POA: Diagnosis not present

## 2019-07-05 DIAGNOSIS — Z7984 Long term (current) use of oral hypoglycemic drugs: Secondary | ICD-10-CM | POA: Diagnosis not present

## 2019-07-05 DIAGNOSIS — M6281 Muscle weakness (generalized): Secondary | ICD-10-CM | POA: Diagnosis not present

## 2019-07-09 ENCOUNTER — Ambulatory Visit (INDEPENDENT_AMBULATORY_CARE_PROVIDER_SITE_OTHER): Payer: Medicare Other

## 2019-07-09 ENCOUNTER — Other Ambulatory Visit: Payer: Self-pay

## 2019-07-09 ENCOUNTER — Other Ambulatory Visit: Payer: Self-pay | Admitting: Physician Assistant

## 2019-07-09 DIAGNOSIS — E538 Deficiency of other specified B group vitamins: Secondary | ICD-10-CM

## 2019-07-09 MED ORDER — CYANOCOBALAMIN 1000 MCG/ML IJ SOLN
1000.0000 ug | Freq: Once | INTRAMUSCULAR | Status: AC
  Administered 2019-07-09: 11:00:00 1000 ug via INTRAMUSCULAR

## 2019-07-09 NOTE — Progress Notes (Signed)
b12 Injection given.   Tami Lenahan J Pope Brunty, MD  

## 2019-07-11 DIAGNOSIS — Z7984 Long term (current) use of oral hypoglycemic drugs: Secondary | ICD-10-CM | POA: Diagnosis not present

## 2019-07-11 DIAGNOSIS — R413 Other amnesia: Secondary | ICD-10-CM | POA: Diagnosis not present

## 2019-07-11 DIAGNOSIS — C50311 Malignant neoplasm of lower-inner quadrant of right female breast: Secondary | ICD-10-CM | POA: Diagnosis not present

## 2019-07-11 DIAGNOSIS — I110 Hypertensive heart disease with heart failure: Secondary | ICD-10-CM | POA: Diagnosis not present

## 2019-07-11 DIAGNOSIS — I5032 Chronic diastolic (congestive) heart failure: Secondary | ICD-10-CM | POA: Diagnosis not present

## 2019-07-11 DIAGNOSIS — I361 Nonrheumatic tricuspid (valve) insufficiency: Secondary | ICD-10-CM | POA: Diagnosis not present

## 2019-07-11 DIAGNOSIS — Z170 Estrogen receptor positive status [ER+]: Secondary | ICD-10-CM | POA: Diagnosis not present

## 2019-07-11 DIAGNOSIS — M6281 Muscle weakness (generalized): Secondary | ICD-10-CM | POA: Diagnosis not present

## 2019-07-11 DIAGNOSIS — E119 Type 2 diabetes mellitus without complications: Secondary | ICD-10-CM | POA: Diagnosis not present

## 2019-07-18 ENCOUNTER — Ambulatory Visit: Payer: Medicare Other | Admitting: Physician Assistant

## 2019-07-20 ENCOUNTER — Other Ambulatory Visit: Payer: Self-pay

## 2019-07-20 ENCOUNTER — Ambulatory Visit (INDEPENDENT_AMBULATORY_CARE_PROVIDER_SITE_OTHER): Payer: Medicare Other | Admitting: Family

## 2019-07-20 ENCOUNTER — Encounter: Payer: Self-pay | Admitting: Family

## 2019-07-20 ENCOUNTER — Ambulatory Visit (INDEPENDENT_AMBULATORY_CARE_PROVIDER_SITE_OTHER): Payer: Medicare Other

## 2019-07-20 VITALS — BP 152/76 | HR 69 | Temp 98.0°F | Ht 63.0 in | Wt 129.0 lb

## 2019-07-20 DIAGNOSIS — R600 Localized edema: Secondary | ICD-10-CM | POA: Diagnosis not present

## 2019-07-20 DIAGNOSIS — R918 Other nonspecific abnormal finding of lung field: Secondary | ICD-10-CM | POA: Diagnosis not present

## 2019-07-20 LAB — CBC WITH DIFFERENTIAL/PLATELET
Basophils Absolute: 0 10*3/uL (ref 0.0–0.1)
Basophils Relative: 0.4 % (ref 0.0–3.0)
Eosinophils Absolute: 0 10*3/uL (ref 0.0–0.7)
Eosinophils Relative: 0.6 % (ref 0.0–5.0)
HCT: 33.3 % — ABNORMAL LOW (ref 36.0–46.0)
Hemoglobin: 11.3 g/dL — ABNORMAL LOW (ref 12.0–15.0)
Lymphocytes Relative: 22.3 % (ref 12.0–46.0)
Lymphs Abs: 1.7 10*3/uL (ref 0.7–4.0)
MCHC: 34 g/dL (ref 30.0–36.0)
MCV: 98 fl (ref 78.0–100.0)
Monocytes Absolute: 0.8 10*3/uL (ref 0.1–1.0)
Monocytes Relative: 10.2 % (ref 3.0–12.0)
Neutro Abs: 5.2 10*3/uL (ref 1.4–7.7)
Neutrophils Relative %: 66.5 % (ref 43.0–77.0)
Platelets: 141 10*3/uL — ABNORMAL LOW (ref 150.0–400.0)
RBC: 3.39 Mil/uL — ABNORMAL LOW (ref 3.87–5.11)
RDW: 14.1 % (ref 11.5–15.5)
WBC: 7.8 10*3/uL (ref 4.0–10.5)

## 2019-07-20 LAB — COMPREHENSIVE METABOLIC PANEL
ALT: 12 U/L (ref 0–35)
AST: 13 U/L (ref 0–37)
Albumin: 4 g/dL (ref 3.5–5.2)
Alkaline Phosphatase: 40 U/L (ref 39–117)
BUN: 10 mg/dL (ref 6–23)
CO2: 30 mEq/L (ref 19–32)
Calcium: 9.5 mg/dL (ref 8.4–10.5)
Chloride: 104 mEq/L (ref 96–112)
Creatinine, Ser: 0.95 mg/dL (ref 0.40–1.20)
GFR: 55.29 mL/min — ABNORMAL LOW (ref >60.00)
Glucose, Bld: 143 mg/dL — ABNORMAL HIGH (ref 70–99)
Potassium: 4.3 mEq/L (ref 3.5–5.1)
Sodium: 140 mEq/L (ref 135–145)
Total Bilirubin: 0.7 mg/dL (ref 0.2–1.2)
Total Protein: 6.7 g/dL (ref 6.0–8.3)

## 2019-07-20 LAB — URIC ACID: Uric Acid, Serum: 4.6 mg/dL (ref 2.4–7.0)

## 2019-07-20 LAB — BRAIN NATRIURETIC PEPTIDE: Pro B Natriuretic peptide (BNP): 339 pg/mL — ABNORMAL HIGH (ref 0.0–100.0)

## 2019-07-20 MED ORDER — FUROSEMIDE 20 MG PO TABS: 20.0000 mg | ORAL_TABLET | Freq: Two times a day (BID) | ORAL | 0 refills | Status: DC | PRN

## 2019-07-20 MED ORDER — DOXYCYCLINE HYCLATE 100 MG PO TABS: 100.0000 mg | ORAL_TABLET | Freq: Two times a day (BID) | ORAL | 0 refills | Status: DC

## 2019-07-20 NOTE — Progress Notes (Signed)
Staceyann SHRONDA BOEH is a 86 y.o. female with the following history as recorded in Epic[facility]:  Patient Active Problem List   Diagnosis Date Noted  . Palpitations 05/23/2019  . Chronic diastolic CHF (congestive heart failure) ([place]) 05/14/2019  . Hypertensive urgency 05/14/2019  . Mild tricuspid regurgitation 05/13/2019  . LVH (left ventricular hypertrophy), mild 05/13/2019  . Memory difficulty 12/05/2018  . Genetic testing 08/30/2017  . Depression 06/01/2017  . Family history of breast cancer   . Family history of colon cancer   . Family history of pancreatic cancer   . Malignant neoplasm of lower-inner quadrant of right breast of female, estrogen receptor positive ([place]) 10/28/2016  . Abnormal electrocardiogram 05/05/2015  . BPPV (benign paroxysmal positional vertigo) 10/01/2014  . Murmur 04/18/2014  . Anal fissure 12/16/2011  . Fibrocystic breast changes. Left.  Biopsy 07/02/2011. 07/14/2011  . History of breast cancer, Right, T1c, N0, lumpectomy 06/26/2008.   Left, T1c, N0, lumpectomy 06/24/2006. 05/12/2011  . Diabetes mellitus type 2, controlled ([place]) 06/11/2009  . PREMATURE VENTRICULAR CONTRACTIONS 08/19/2008  . B12 deficiency 08/29/2007  . ESOPHAGEAL STRICTURE 04/27/2007  . GERD (gastroesophageal reflux disease) 04/27/2007  . DIVERTICULOSIS, COLON 04/27/2007  . OTHER ALOPECIA 02/14/2007  . Dyslipidemia 01/02/2007  . Essential hypertension 01/02/2007  . Carotid stenosis 08/22/2006    Current Outpatient Medications  Medication Sig Dispense Refill  . ACCU-CHEK GUIDE test strip USE AS DIRECTED 100 strip 3  . aspirin EC 81 MG tablet Take 81 mg by mouth as needed (for headaches).    . blood glucose meter kit and supplies KIT Dispense based on patient and insurance preference. Check sugars once daily and as needed as directed. 1 each 0  . clobetasol cream (TEMOVATE) 7.06 % Apply 1 application topically as needed (for skin irritation- to affected areas).     . cyanocobalamin (,VITAMIN  B-12,) 1000 MCG/ML injection Inject 1,000 mcg into the muscle every 30 (thirty) days.     . diphenhydramine-acetaminophen (TYLENOL PM) 25-500 MG TABS tablet Take 1 tablet by mouth at bedtime as needed (for sleep).    . flecainide (TAMBOCOR) 50 MG tablet TAKE 1 TABLET BY MOUTH TWO  TIMES DAILY (Patient taking differently: Take 50 mg by mouth 2 (two) times daily. ) 180 tablet 3  . losartan (COZAAR) 100 MG tablet Take 1 tablet (100 mg total) by mouth daily. 90 tablet 1  . metFORMIN (GLUCOPHAGE-XR) 500 MG 24 hr tablet TAKE 1 TABLET BY MOUTH ONCE DAILY WITH BREAKFAST (Patient taking differently: Take 500 mg by mouth daily with breakfast. ) 90 tablet 3  . metoprolol succinate (TOPROL-XL) 50 MG 24 hr tablet Take 1 tablet (50 mg total) by mouth daily. Take with or immediately following a meal. 30 tablet 0  . naproxen sodium (ANAPROX) 220 MG tablet Take 220 mg by mouth daily as needed (for pain or headaches).     Marland Kitchen omeprazole (PRILOSEC) 20 MG capsule TAKE 1 CAPSULE BY MOUTH  DAILY 90 capsule 0  . tamoxifen (NOLVADEX) 20 MG tablet TAKE 1 TABLET BY MOUTH  DAILY (Patient taking differently: Take 20 mg by mouth daily. ) 90 tablet 3  . doxycycline (VIBRA-TABS) 100 MG tablet Take 1 tablet (100 mg total) by mouth 2 (two) times daily. 14 tablet 0  . furosemide (LASIX) 20 MG tablet Take 1 tablet (20 mg total) by mouth 2 (two) times daily as needed for fluid or edema. 30 tablet 0   No current facility-administered medications for this visit.    Allergies: Statins,  Effexor [venlafaxine], Cephalexin, Codeine, Lansoprazole, Latex, Levofloxacin, Nitrofurantoin, and Rofecoxib  Past Medical History:  Diagnosis Date  . Arthritis    left hip  . Breast cancer, right ([place]) 11/2016  . Family history of breast cancer   . Family history of colon cancer   . Family history of pancreatic cancer   . GERD (gastroesophageal reflux disease)   . Non-insulin dependent type 2 diabetes mellitus ([place])   . PAC (premature atrial  contraction)   . Personal history of radiation therapy 12/2016  . Premature ventricular contraction   . Seasonal allergies   . Sensitive skin   . Vitamin B12 deficiency     Past Surgical History:  Procedure Laterality Date  . ABDOMINAL HYSTERECTOMY  1979   partial  . BREAST BIOPSY  07/02/2011   Procedure: BREAST BIOPSY WITH NEEDLE LOCALIZATION;  Surgeon: Shann Medal, MD;  Location: [facility];  Service: General;  Laterality: Left;  left breast atypical hyperplasia needle localization biopsy  . BREAST BIOPSY Left 06/13/2006  . BREAST BIOPSY Right 06/27/2008  . BREAST BIOPSY Right 09/27/2016  . BREAST EXCISIONAL BIOPSY Left 07/02/2011  . BREAST LUMPECTOMY Left 07/04/2006  . BREAST LUMPECTOMY Right 06/26/2008  . BREAST LUMPECTOMY Right 09/27/2016  . BREAST LUMPECTOMY WITH RADIOACTIVE SEED LOCALIZATION Right 11/18/2016   Procedure: RIGHT BREAST LUMPECTOMY WITH RADIOACTIVE SEED LOCALIZATION ERAS PATHWAY;  Surgeon: Alphonsa Overall, MD;  Location: [facility];  Service: General;  Laterality: Right;  ERAS PATHWAY  . CATARACT EXTRACTION Bilateral   . CHOLECYSTECTOMY, LAPAROSCOPIC  07/07/2001   had appendex taking out at the same time  . ESOPHAGEAL DILATION  2006  . LAPAROSCOPIC APPENDECTOMY  07/07/2001  . TONSILLECTOMY  1942    Family History  Problem Relation Age of Onset  . Colon cancer Mother 33  . Heart attack Mother   . Breast cancer Sister 57  . Heart disease Maternal Uncle        CABG  . Breast cancer Sister 23  . Heart disease Brother   . Other Father        suicide  . Breast cancer Sister 72  . Heart disease Sister   . COPD Brother   . Heart disease Brother   . Breast cancer Other 36       niece - brother's daughter  . Pancreatic cancer Other        brother's son, dx in his 73s    Social History   Tobacco Use  . Smoking status: Never Smoker  . Smokeless tobacco: Never Used  Substance Use Topics  . Alcohol use: No    Alcohol/week: 0.0 standard drinks     Subjective:  Patient presents with concerns for localized swelling in her right lower leg/ right foot x 3-4 days; problem list indicates chronic CHF but she denies any problems with heart failure in the past; she is concerned for gout because her foot is hurting so badly but she has never had gout in the past; does feel that her weight is up slightly today but no wheezing, shortness of breath;   Objective:  Vitals:   07/20/19 1320  BP: (!) 152/76  Pulse: 69  Temp: 98 F (36.7 C)  TempSrc: Oral  SpO2: 99%  Weight: 129 lb (58.5 kg)  Height: '5\' 3"'  (1.6 m)    General: Well developed, well nourished, in no acute distress  Skin : Warm and dry.  Head: Normocephalic and atraumatic  Eyes: Sclera and conjunctiva clear; pupils round and  reactive to light; extraocular movements intact  Ears: External normal; canals clear; tympanic membranes normal  Oropharynx: Pink, supple. No suspicious lesions  Neck: Supple without thyromegaly, adenopathy  Lungs: Respirations unlabored; clear to auscultation bilaterally without wheeze, rales, rhonchi  CVS exam: normal rate and regular rhythm.  Abdomen: Soft; nontender; nondistended; normoactive bowel sounds; no masses or hepatosplenomegaly  Musculoskeletal: No deformities; no active joint inflammation  Extremities: 1+ pitting edema, cyanosis, clubbing  Vessels: Symmetric bilaterally  Neurologic: Alert and oriented; speech intact; face symmetrical; moves all extremities well; CNII-XII intact without focal deficit   Assessment:  1. Pedal edema     Plan:  Suspect cellulitis/ heart failure exacerbation; update labs and CXR today; will go ahead and start Doxycycline and Lasix; follow-up to be determined; may need doppler of right lower leg if symptoms persist- patient was seen on Friday afternoon and could not get scheduled today;  This visit occurred during the SARS-CoV-2 public health emergency.  Safety protocols were in place, including screening questions  prior to the visit, additional usage of staff PPE, and extensive cleaning of exam room while observing appropriate contact time as indicated for disinfecting solutions.     No follow-ups on file.  Orders Placed This Encounter  Procedures  . DG Chest 2 View    Standing Status:   Future    Number of Occurrences:   1    Standing Expiration Date:   09/18/2020    Order Specific Question:   Reason for Exam (SYMPTOM  OR DIAGNOSIS REQUIRED)    Answer:   pedal edema    Order Specific Question:   Preferred imaging location?    Answer:   [facility]    Order Specific Question:   Radiology Contrast Protocol - do NOT remove file path    Answer:   [email or web address]  . B Nat Peptide  . D-Dimer, Quantitative  . Uric acid  . CBC with Differential/Platelet  . Comp Met (CMET)    Requested Prescriptions   Signed Prescriptions Disp Refills  . furosemide (LASIX) 20 MG tablet 30 tablet 0    Sig: Take 1 tablet (20 mg total) by mouth 2 (two) times daily as needed for fluid or edema.  Marland Kitchen doxycycline (VIBRA-TABS) 100 MG tablet 14 tablet 0    Sig: Take 1 tablet (100 mg total) by mouth 2 (two) times daily.

## 2019-07-21 LAB — D-DIMER, QUANTITATIVE: D-Dimer, Quant: 1.15 mcg/mL FEU — ABNORMAL HIGH (ref <0.50)

## 2019-07-23 ENCOUNTER — Telehealth: Payer: Self-pay

## 2019-07-23 ENCOUNTER — Other Ambulatory Visit: Payer: Self-pay | Admitting: Family

## 2019-07-23 DIAGNOSIS — R911 Solitary pulmonary nodule: Secondary | ICD-10-CM

## 2019-07-23 NOTE — Telephone Encounter (Signed)
Called pt back concerning cxr and lab results. See reports.Marland KitchenJohny Jacobson

## 2019-07-23 NOTE — Telephone Encounter (Signed)
New message   The patient calling back to speak with the CMA.

## 2019-07-24 ENCOUNTER — Telehealth: Payer: Self-pay

## 2019-07-24 ENCOUNTER — Telehealth: Payer: Self-pay | Admitting: Internal Medicine

## 2019-07-24 ENCOUNTER — Ambulatory Visit: Payer: Medicare Other | Admitting: Physician Assistant

## 2019-07-24 NOTE — Telephone Encounter (Signed)
-----   Message from Alfredia Ferguson, PA-C sent at 07/24/2019  9:40 AM EDT ----- Regarding: refill meds Tami Jacobson, Tami Jacobson had an appointment with me today but canceled, apparently due to vomiting.  She was coming in for refills on omeprazole 20 mg p.o. twice daily for GERD  Please go ahead and refill omeprazole 20 mg p.o. twice daily x6, and please call patient and see if she has anything at home to take for nausea, she would like a prescription for Zofran 4 mg p.o. every 6 hours as needed, please send Rx #30 no refills

## 2019-07-24 NOTE — Telephone Encounter (Signed)
New message:   Pt states she needs to speak to an assistant in regards to how she is feeling right now. She states she ate breakfast and as soon as she finished she vomited it all up. She states she also had diarrhea coming from the other end. She states she is not sure if it is this new medication or not. Please advise.

## 2019-07-24 NOTE — Telephone Encounter (Signed)
New message:   Pt is calling back again and would like to know what made her sick. She states she is feeling better now but would still like a call back. Pt has asked for Lorre Nick so that's why I added her as a recipient too.

## 2019-07-24 NOTE — Telephone Encounter (Signed)
Spoke with pt in regards. She states she is feeling better than she was. She was able to eat soup and crackers. She has been on antibiotics since Friday afternoon and sxs started this morning. I advised it was less likely from antibiotics since she had been on them for a few days already. I told her to monitor her sxs and drink plenty of fluids and get rest and let us know if things do not get better. Pt expressed understanding and will keep [place] updated.

## 2019-07-26 NOTE — Telephone Encounter (Signed)
Spoke with the patient. She thanks me for my call. States she had a "bug." She is feeling better. She has her PPI through Dr Quay Burow and says not to worry.  OV 08/06/19

## 2019-07-27 ENCOUNTER — Ambulatory Visit
Admission: RE | Admit: 2019-07-27 | Discharge: 2019-07-27 | Disposition: A | Discharge status: Home / Self Care | Payer: Medicare Other | Source: Ambulatory Visit | Attending: Family | Admitting: Family

## 2019-07-27 ENCOUNTER — Other Ambulatory Visit: Payer: Self-pay

## 2019-07-27 DIAGNOSIS — R918 Other nonspecific abnormal finding of lung field: Secondary | ICD-10-CM | POA: Diagnosis not present

## 2019-07-27 DIAGNOSIS — R911 Solitary pulmonary nodule: Secondary | ICD-10-CM

## 2019-07-30 ENCOUNTER — Telehealth: Payer: Self-pay | Admitting: Oncology

## 2019-07-30 ENCOUNTER — Other Ambulatory Visit: Payer: Self-pay | Admitting: Oncology

## 2019-07-30 DIAGNOSIS — Z853 Personal history of malignant neoplasm of breast: Secondary | ICD-10-CM

## 2019-07-30 NOTE — Progress Notes (Signed)
Reviewed with patient; she is doing well at this time with no concerns; she notes the swelling in her right leg has resolved and she is planning to see her cardiologist in follow-up in about 3 weeks; will plan to re-check the Ct for stability in about 6 months.

## 2019-07-30 NOTE — Telephone Encounter (Signed)
Pt called with question regarding her next appt date and time. Pt confirmed next appt date and time.

## 2019-07-30 NOTE — Telephone Encounter (Signed)
Rescheduled 6/29 appt per provider PAL. Left voicemail with new appt date and time. Mailed appt reminder and calendar.

## 2019-08-06 ENCOUNTER — Encounter: Payer: Self-pay | Admitting: Physician Assistant

## 2019-08-06 ENCOUNTER — Ambulatory Visit: Payer: Medicare Other | Admitting: Physician Assistant

## 2019-08-06 VITALS — BP 132/62 | HR 64 | Temp 98.3°F | Ht 62.5 in | Wt 130.2 lb

## 2019-08-06 DIAGNOSIS — K219 Gastro-esophageal reflux disease without esophagitis: Secondary | ICD-10-CM | POA: Diagnosis not present

## 2019-08-06 MED ORDER — OMEPRAZOLE 20 MG PO CPDR: 20.0000 mg | DELAYED_RELEASE_CAPSULE | Freq: Every day | ORAL | 3 refills | Status: DC

## 2019-08-06 NOTE — Patient Instructions (Signed)
If you are age 84 or older, your body mass index should be between 23-30. Your Body mass index is 23.44 kg/m. If this is out of the aforementioned range listed, please consider follow up with your Primary Care Provider.  If you are age 68 or younger, your body mass index should be between 19-25. Your Body mass index is 23.44 kg/m. If this is out of the aformentioned range listed, please consider follow up with your Primary Care Provider.   Continue Omeprazole 20 mg 1 capsule daily.  Follow up as needed with Amy Esterwood PA-C or Dr. Silverio Decamp.

## 2019-08-06 NOTE — Progress Notes (Signed)
Subjective:    Patient ID: Tami Jacobson, female    DOB: 12-Jan-1928, 86 y.o.   MRN: [id number]  HPI Tami Jacobson is a pleasant 86 year old white female, established with Dr. Silverio Decamp,, also known to myself who comes in today for routine follow-up for GERD. Patient was last seen in January 2020.  She is maintained on omeprazole 20 mg p.o. every morning and says this works well to control her reflux symptoms. She has history of esophageal stricture requiring dilation in 2009.  She says she will very occasionally will have some mild solid food dysphagia but this is typically if she has eaten too fast. No complaints of abdominal pain or discomfort.  She has occasional mild constipation but generally does not require any laxatives.  She has not noted any melena or hematochezia. Other medical problems include prior history of breast cancer, BPPV, B12 deficiency, diverticulosis, adult onset diabetes mellitus, congestive heart failure, PVCs and hypertension.  Review of Systems Pertinent positive and negative review of systems were noted in the above HPI section.  All other review of systems was otherwise negative.  Outpatient Encounter Medications as of 08/06/2019  Medication Sig  . ACCU-CHEK GUIDE test strip USE AS DIRECTED  . aspirin EC 81 MG tablet Take 81 mg by mouth as needed (for headaches).  . blood glucose meter kit and supplies KIT Dispense based on patient and insurance preference. Check sugars once daily and as needed as directed.  . clobetasol cream (TEMOVATE) 5.62 % Apply 1 application topically as needed (for skin irritation- to affected areas).   . cyanocobalamin (,VITAMIN B-12,) 1000 MCG/ML injection Inject 1,000 mcg into the muscle every 30 (thirty) days.   . diphenhydramine-acetaminophen (TYLENOL PM) 25-500 MG TABS tablet Take 1 tablet by mouth at bedtime as needed (for sleep).  . flecainide (TAMBOCOR) 50 MG tablet TAKE 1 TABLET BY MOUTH TWO  TIMES DAILY (Patient taking differently: Take  50 mg by mouth 2 (two) times daily. )  . losartan (COZAAR) 100 MG tablet Take 1 tablet (100 mg total) by mouth daily.  . metFORMIN (GLUCOPHAGE-XR) 500 MG 24 hr tablet TAKE 1 TABLET BY MOUTH ONCE DAILY WITH BREAKFAST (Patient taking differently: Take 500 mg by mouth daily with breakfast. )  . metoprolol succinate (TOPROL-XL) 50 MG 24 hr tablet Take 1 tablet (50 mg total) by mouth daily. Take with or immediately following a meal.  . naproxen sodium (ANAPROX) 220 MG tablet Take 220 mg by mouth daily as needed (for pain or headaches).   Marland Kitchen omeprazole (PRILOSEC) 20 MG capsule Take 1 capsule (20 mg total) by mouth daily.  . tamoxifen (NOLVADEX) 20 MG tablet TAKE 1 TABLET BY MOUTH  DAILY (Patient taking differently: Take 20 mg by mouth daily. )  . [DISCONTINUED] omeprazole (PRILOSEC) 20 MG capsule TAKE 1 CAPSULE BY MOUTH  DAILY  . [DISCONTINUED] doxycycline (VIBRA-TABS) 100 MG tablet Take 1 tablet (100 mg total) by mouth 2 (two) times daily.  . [DISCONTINUED] furosemide (LASIX) 20 MG tablet Take 1 tablet (20 mg total) by mouth 2 (two) times daily as needed for fluid or edema.   No facility-administered encounter medications on file as of 08/06/2019.   Allergies  Allergen Reactions  . Statins Other (See Comments)    GI UPSET  . Effexor [Venlafaxine]     Constipation, shaking, dizzy  . Cephalexin Other (See Comments)    Reaction not recalled by the patient  . Codeine Other (See Comments)    Reaction not recalled by the  patient  . Lansoprazole Other (See Comments)    Reaction not recalled by the patient  . Latex Rash       . Levofloxacin Other (See Comments)    Reaction not recalled by the patient  . Nitrofurantoin Other (See Comments)    Reaction not recalled by the patient  . Rofecoxib Other (See Comments)    Reaction not recalled by the patient    Patient Active Problem List   Diagnosis Date Noted  . Palpitations 05/23/2019  . Chronic diastolic CHF (congestive heart failure) ([place])  05/14/2019  . Hypertensive urgency 05/14/2019  . Mild tricuspid regurgitation 05/13/2019  . LVH (left ventricular hypertrophy), mild 05/13/2019  . Memory difficulty 12/05/2018  . Genetic testing 08/30/2017  . Depression 06/01/2017  . Family history of breast cancer   . Family history of colon cancer   . Family history of pancreatic cancer   . Malignant neoplasm of lower-inner quadrant of right breast of female, estrogen receptor positive ([place]) 10/28/2016  . Abnormal electrocardiogram 05/05/2015  . BPPV (benign paroxysmal positional vertigo) 10/01/2014  . Murmur 04/18/2014  . Anal fissure 12/16/2011  . Fibrocystic breast changes. Left.  Biopsy 07/02/2011. 07/14/2011  . History of breast cancer, Right, T1c, N0, lumpectomy 06/26/2008.   Left, T1c, N0, lumpectomy 06/24/2006. 05/12/2011  . Diabetes mellitus type 2, controlled ([place]) 06/11/2009  . PREMATURE VENTRICULAR CONTRACTIONS 08/19/2008  . B12 deficiency 08/29/2007  . ESOPHAGEAL STRICTURE 04/27/2007  . GERD (gastroesophageal reflux disease) 04/27/2007  . DIVERTICULOSIS, COLON 04/27/2007  . OTHER ALOPECIA 02/14/2007  . Dyslipidemia 01/02/2007  . Essential hypertension 01/02/2007  . Carotid stenosis 08/22/2006   Social History   Socioeconomic History  . Marital status: Divorced    Spouse name: Marcello Moores  . Number of children: 1  . Years of education: Not on file  . Highest education level: Not on file  Occupational History  . Occupation: retire  Tobacco Use  . Smoking status: Never Smoker  . Smokeless tobacco: Never Used  Substance and Sexual Activity  . Alcohol use: No    Alcohol/week: 0.0 standard drinks  . Drug use: No  . Sexual activity: Not Currently  Other Topics Concern  . Not on file  Social History Narrative  . Not on file   Social Determinants of Health   Financial Resource Strain:   . Difficulty of Paying Living Expenses:   Food Insecurity:   . Worried About Charity fundraiser in the Last Year:   . Youth worker in the Last Year:   Transportation Needs:   . Film/video editor (Medical):   Marland Kitchen Lack of Transportation (Non-Medical):   Physical Activity: Insufficiently Active  . Days of Exercise per Week: 2 days  . Minutes of Exercise per Session: 30 min  Stress:   . Feeling of Stress :   Social Connections:   . Frequency of Communication with Friends and Family:   . Frequency of Social Gatherings with Friends and Family:   . Attends Religious Services:   . Active Member of Clubs or Organizations:   . Attends Archivist Meetings:   Marland Kitchen Marital Status:   Intimate Partner Violence:   . Fear of Current or Ex-Partner:   . Emotionally Abused:   Marland Kitchen Physically Abused:   . Sexually Abused:     Ms. Devine family history includes Breast cancer (age of onset: 37) in an other family member; Breast cancer (age of onset: 2) in her sister; Breast cancer (age of  onset: 87) in her sister; Breast cancer (age of onset: 34) in her sister; COPD in her brother; Colon cancer (age of onset: 63) in her mother; Heart attack in her mother; Heart disease in her brother, brother, maternal uncle, and sister; Other in her father; Pancreatic cancer in an other family member.      Objective:    Vitals:   08/06/19 1114  BP: 132/62  Pulse: 64  Temp: 98.3 F (36.8 C)    Physical Exam Well-developed well-nourished elderly white female in no acute distress.  Height, Weight 130, BMI 23.4  HEENT; nontraumatic normocephalic, EOMI, PER R LA, sclera anicteric. Oropharynx; not examined today Neck; supple, no JVD Cardiovascular; regular rate and rhythm with H4-V4, systolic murmur Pulmonary; Clear bilaterally Abdomen; soft, nontender, nondistended, no palpable mass or hepatosplenomegaly, bowel sounds are active Rectal; not done today Skin; benign exam, no jaundice rash or appreciable lesions Extremities; no clubbing cyanosis or edema skin warm and dry Neuro/Psych; alert and oriented x4, grossly nonfocal  mood and affect appropriate       Assessment & Plan:   #48 86 year old white female with chronic GERD, well controlled on omeprazole 20 mg p.o. every morning. #2 history of esophageal stricture, status post dilation 2009.Marland Kitchen  No significant dysphagia complaints. 3.  Diverticulosis 4.  Adult onset diabetes mellitus 5.  Congestive heart failure 6.  History of breast cancer 7.  BPPV  Plan; continue omeprazole 20 mg p.o. every morning, refill [facility] through [facility]/90-day supply refill x1 year. Patient will follow up with Dr. Silverio Decamp  or myself as needed.  Claressa Hughley S Deanta Mincey PA-C 08/06/2019   Cc: Binnie Rail, MD

## 2019-08-07 NOTE — Progress Notes (Signed)
Reviewed and agree with documentation and assessment and plan. K. Veena Latorsha Curling , MD   

## 2019-08-08 ENCOUNTER — Ambulatory Visit (INDEPENDENT_AMBULATORY_CARE_PROVIDER_SITE_OTHER): Payer: Medicare Other | Admitting: *Deleted

## 2019-08-08 ENCOUNTER — Other Ambulatory Visit: Payer: Self-pay

## 2019-08-08 DIAGNOSIS — E538 Deficiency of other specified B group vitamins: Secondary | ICD-10-CM

## 2019-08-08 MED ORDER — CYANOCOBALAMIN 1000 MCG/ML IJ SOLN
1000.0000 ug | Freq: Once | INTRAMUSCULAR | Status: AC
  Administered 2019-08-08: 14:00:00 1000 ug via INTRAMUSCULAR

## 2019-08-08 NOTE — Progress Notes (Signed)
Pls cosign for B12 inj../lmb  

## 2019-08-12 NOTE — Patient Instructions (Addendum)
  Your a1c was checked today.     Medications reviewed and updated.  Changes include :   none     Please followup in 6 months

## 2019-08-12 NOTE — Progress Notes (Signed)
Subjective:    Patient ID: Tami Jacobson, female    DOB: December 06, 1927, 86 y.o.   MRN: [id number]  HPI The patient is here for follow up of their chronic medical problems, including htn, DM.  She is taking all of her medications as prescribed.    Her BP at home is high at home - 169/75, 128/81 at 11 am.  Over the past several days -  Her BP has been 127/61-158/74 in morning, 98/56-159/73 in afternoon.    Her feet have been swollen the past few days.      Medications and allergies reviewed with patient and updated if appropriate.  Patient Active Problem List   Diagnosis Date Noted  . Palpitations 05/23/2019  . Chronic diastolic CHF (congestive heart failure) ([place]) 05/14/2019  . Hypertensive urgency 05/14/2019  . Mild tricuspid regurgitation 05/13/2019  . LVH (left ventricular hypertrophy), mild 05/13/2019  . Memory difficulty 12/05/2018  . Genetic testing 08/30/2017  . Depression 06/01/2017  . Family history of breast cancer   . Family history of colon cancer   . Family history of pancreatic cancer   . Malignant neoplasm of lower-inner quadrant of right breast of female, estrogen receptor positive ([place]) 10/28/2016  . Abnormal electrocardiogram 05/05/2015  . BPPV (benign paroxysmal positional vertigo) 10/01/2014  . Murmur 04/18/2014  . Anal fissure 12/16/2011  . Fibrocystic breast changes. Left.  Biopsy 07/02/2011. 07/14/2011  . History of breast cancer, Right, T1c, N0, lumpectomy 06/26/2008.   Left, T1c, N0, lumpectomy 06/24/2006. 05/12/2011  . Diabetes mellitus type 2, controlled ([place]) 06/11/2009  . PREMATURE VENTRICULAR CONTRACTIONS 08/19/2008  . B12 deficiency 08/29/2007  . ESOPHAGEAL STRICTURE 04/27/2007  . GERD (gastroesophageal reflux disease) 04/27/2007  . DIVERTICULOSIS, COLON 04/27/2007  . OTHER ALOPECIA 02/14/2007  . Dyslipidemia 01/02/2007  . Essential hypertension 01/02/2007  . Carotid stenosis 08/22/2006    Current Outpatient Medications on File Prior  to Visit  Medication Sig Dispense Refill  . ACCU-CHEK GUIDE test strip USE AS DIRECTED 100 strip 3  . aspirin EC 81 MG tablet Take 81 mg by mouth as needed (for headaches).    . blood glucose meter kit and supplies KIT Dispense based on patient and insurance preference. Check sugars once daily and as needed as directed. 1 each 0  . clobetasol cream (TEMOVATE) 9.02 % Apply 1 application topically as needed (for skin irritation- to affected areas).     . cyanocobalamin (,VITAMIN B-12,) 1000 MCG/ML injection Inject 1,000 mcg into the muscle every 30 (thirty) days.     . diphenhydramine-acetaminophen (TYLENOL PM) 25-500 MG TABS tablet Take 1 tablet by mouth at bedtime as needed (for sleep).    . flecainide (TAMBOCOR) 50 MG tablet TAKE 1 TABLET BY MOUTH TWO  TIMES DAILY (Patient taking differently: Take 50 mg by mouth 2 (two) times daily. ) 180 tablet 3  . losartan (COZAAR) 100 MG tablet Take 1 tablet (100 mg total) by mouth daily. 90 tablet 1  . metFORMIN (GLUCOPHAGE-XR) 500 MG 24 hr tablet TAKE 1 TABLET BY MOUTH ONCE DAILY WITH BREAKFAST (Patient taking differently: Take 500 mg by mouth daily with breakfast. ) 90 tablet 3  . metoprolol succinate (TOPROL-XL) 50 MG 24 hr tablet Take 1 tablet (50 mg total) by mouth daily. Take with or immediately following a meal. 30 tablet 0  . naproxen sodium (ANAPROX) 220 MG tablet Take 220 mg by mouth daily as needed (for pain or headaches).     Marland Kitchen omeprazole (PRILOSEC) 20 MG capsule  Take 1 capsule (20 mg total) by mouth daily. 90 capsule 3  . tamoxifen (NOLVADEX) 20 MG tablet TAKE 1 TABLET BY MOUTH  DAILY (Patient taking differently: Take 20 mg by mouth daily. ) 90 tablet 3   No current facility-administered medications on file prior to visit.    Past Medical History:  Diagnosis Date  . Arthritis    left hip  . Breast cancer, right ([place]) 11/2016  . Family history of breast cancer   . Family history of colon cancer   . Family history of pancreatic cancer   .  GERD (gastroesophageal reflux disease)   . Non-insulin dependent type 2 diabetes mellitus ([place])   . PAC (premature atrial contraction)   . Personal history of radiation therapy 12/2016  . Premature ventricular contraction   . Seasonal allergies   . Sensitive skin   . Vitamin B12 deficiency     Past Surgical History:  Procedure Laterality Date  . ABDOMINAL HYSTERECTOMY  1979   partial  . BREAST BIOPSY  07/02/2011   Procedure: BREAST BIOPSY WITH NEEDLE LOCALIZATION;  Surgeon: Shann Medal, MD;  Location: [facility];  Service: General;  Laterality: Left;  left breast atypical hyperplasia needle localization biopsy  . BREAST BIOPSY Left 06/13/2006  . BREAST BIOPSY Right 06/27/2008  . BREAST BIOPSY Right 09/27/2016  . BREAST EXCISIONAL BIOPSY Left 07/02/2011  . BREAST LUMPECTOMY Left 07/04/2006  . BREAST LUMPECTOMY Right 06/26/2008  . BREAST LUMPECTOMY Right 09/27/2016  . BREAST LUMPECTOMY WITH RADIOACTIVE SEED LOCALIZATION Right 11/18/2016   Procedure: RIGHT BREAST LUMPECTOMY WITH RADIOACTIVE SEED LOCALIZATION ERAS PATHWAY;  Surgeon: Alphonsa Overall, MD;  Location: [facility];  Service: General;  Laterality: Right;  ERAS PATHWAY  . CATARACT EXTRACTION Bilateral   . CHOLECYSTECTOMY, LAPAROSCOPIC  07/07/2001   had appendex taking out at the same time  . ESOPHAGEAL DILATION  2006  . LAPAROSCOPIC APPENDECTOMY  07/07/2001  . TONSILLECTOMY  1942    Social History   Socioeconomic History  . Marital status: Divorced    Spouse name: Marcello Moores  . Number of children: 1  . Years of education: Not on file  . Highest education level: Not on file  Occupational History  . Occupation: retire  Tobacco Use  . Smoking status: Never Smoker  . Smokeless tobacco: Never Used  Substance and Sexual Activity  . Alcohol use: No    Alcohol/week: 0.0 standard drinks  . Drug use: No  . Sexual activity: Not Currently  Other Topics Concern  . Not on file  Social History Narrative  . Not on file    Social Determinants of Health   Financial Resource Strain:   . Difficulty of Paying Living Expenses:   Food Insecurity:   . Worried About Charity fundraiser in the Last Year:   . Arboriculturist in the Last Year:   Transportation Needs:   . Film/video editor (Medical):   Marland Kitchen Lack of Transportation (Non-Medical):   Physical Activity: Insufficiently Active  . Days of Exercise per Week: 2 days  . Minutes of Exercise per Session: 30 min  Stress:   . Feeling of Stress :   Social Connections:   . Frequency of Communication with Friends and Family:   . Frequency of Social Gatherings with Friends and Family:   . Attends Religious Services:   . Active Member of Clubs or Organizations:   . Attends Archivist Meetings:   Marland Kitchen Marital Status:  Family History  Problem Relation Age of Onset  . Colon cancer Mother 77  . Heart attack Mother   . Breast cancer Sister 59  . Heart disease Maternal Uncle        CABG  . Breast cancer Sister 76  . Heart disease Brother   . Other Father        suicide  . Breast cancer Sister 87  . Heart disease Sister   . COPD Brother   . Heart disease Brother   . Breast cancer Other 70       niece - brother's daughter  . Pancreatic cancer Other        brother's son, dx in his 66s    Review of Systems  Constitutional: Negative for chills.  Respiratory: Negative for shortness of breath.   Cardiovascular: Positive for leg swelling. Negative for chest pain and palpitations.  Neurological: Negative for light-headedness and headaches.       Objective:   Vitals:   08/13/19 1303  BP: (!) 168/70  Pulse: (!) 58  Resp: 16  Temp: 98.8 F (37.1 C)  SpO2: 98%   BP Readings from Last 3 Encounters:  08/13/19 (!) 168/70  08/06/19 132/62  07/20/19 (!) 152/76   Wt Readings from Last 3 Encounters:  08/13/19 130 lb (59 kg)  08/06/19 130 lb 4 oz (59.1 kg)  07/20/19 129 lb (58.5 kg)   Body mass index is 23.4 kg/m.   Physical Exam      Constitutional: Appears well-developed and well-nourished. No distress.  HENT:  Head: Normocephalic and atraumatic.  Neck: Neck supple. No tracheal deviation present. No thyromegaly present.  No cervical lymphadenopathy Cardiovascular: Normal rate, regular rhythm and normal heart sounds.   2/6 systolic murmur heard. No carotid bruit .  No edema Pulmonary/Chest: Effort normal and breath sounds normal. No respiratory distress. No has no wheezes. No rales.  Skin: Skin is warm and dry. Not diaphoretic.  Psychiatric: Normal mood and affect. Behavior is normal.      Assessment & Plan:    See Problem List for Assessment and Plan of chronic medical problems.    This visit occurred during the SARS-CoV-2 public health emergency.  Safety protocols were in place, including screening questions prior to the visit, additional usage of staff PPE, and extensive cleaning of exam room while observing appropriate contact time as indicated for disinfecting solutions.

## 2019-08-13 ENCOUNTER — Encounter: Payer: Self-pay | Admitting: Internal Medicine

## 2019-08-13 ENCOUNTER — Ambulatory Visit (INDEPENDENT_AMBULATORY_CARE_PROVIDER_SITE_OTHER): Payer: Medicare Other | Admitting: Internal Medicine

## 2019-08-13 ENCOUNTER — Other Ambulatory Visit: Payer: Self-pay

## 2019-08-13 VITALS — BP 168/70 | HR 58 | Temp 98.8°F | Resp 16 | Ht 62.5 in | Wt 130.0 lb

## 2019-08-13 DIAGNOSIS — I10 Essential (primary) hypertension: Secondary | ICD-10-CM

## 2019-08-13 DIAGNOSIS — E119 Type 2 diabetes mellitus without complications: Secondary | ICD-10-CM | POA: Diagnosis not present

## 2019-08-13 LAB — POCT GLYCOSYLATED HEMOGLOBIN (HGB A1C): Hemoglobin A1C: 6.7 % — AB (ref 4.0–5.6)

## 2019-08-13 NOTE — Assessment & Plan Note (Addendum)
Chronic BP variable She will try switching one of her BP medications to the evening She will continue to monitor her BP at home She sees cardiology next week She is working on keep her salt intake to a minimum

## 2019-08-13 NOTE — Addendum Note (Signed)
Addended by: Delice Bison E on: 08/13/2019 02:21 PM   Modules accepted: Orders

## 2019-08-13 NOTE — Assessment & Plan Note (Signed)
Chronic Check a1c today On metformin Low sugar / carb diet Encouraged regular exercise

## 2019-08-14 NOTE — Progress Notes (Signed)
HPI: FU palpitations and cerebrovascular disease; history of PACs and PVCs with normal LV function. She had significant palpitations in the past despite beta-blockade. She is therefore on flecainide 50 mg p.o. b.i.d. A previous catheterization in June 2000 showed normal coronary arteries and normal LV function. Nuclear study February 2017 showed ejection fraction 72%and normal perfusion.Carotid dopplers 2/18 showed1-39 bilateral stenosis. Echocardiogram February 2021 showed normal LV function, mild left ventricular hypertrophy, grade 1 diastolic dysfunction, mild left atrial enlargement.  Chest CT April 2021 showed biapical scarring and coronary artery calcification.  Since I last saw her,there is no dyspnea, chest pain, palpitations or syncope.  Her blood pressure has improved with the addition of losartan.  Current Outpatient Medications  Medication Sig Dispense Refill  . ACCU-CHEK GUIDE test strip USE AS DIRECTED 100 strip 3  . aspirin EC 81 MG tablet Take 81 mg by mouth as needed (for headaches).    . blood glucose meter kit and supplies KIT Dispense based on patient and insurance preference. Check sugars once daily and as needed as directed. 1 each 0  . clobetasol cream (TEMOVATE) 3.66 % Apply 1 application topically as needed (for skin irritation- to affected areas).     . cyanocobalamin (,VITAMIN B-12,) 1000 MCG/ML injection Inject 1,000 mcg into the muscle every 30 (thirty) days.     . diphenhydramine-acetaminophen (TYLENOL PM) 25-500 MG TABS tablet Take 1 tablet by mouth at bedtime as needed (for sleep).    . flecainide (TAMBOCOR) 50 MG tablet TAKE 1 TABLET BY MOUTH TWO  TIMES DAILY (Patient taking differently: Take 50 mg by mouth 2 (two) times daily. ) 180 tablet 3  . losartan (COZAAR) 100 MG tablet Take 1 tablet (100 mg total) by mouth daily. 90 tablet 1  . metFORMIN (GLUCOPHAGE-XR) 500 MG 24 hr tablet TAKE 1 TABLET BY MOUTH ONCE DAILY WITH BREAKFAST (Patient taking differently:  Take 500 mg by mouth daily with breakfast. ) 90 tablet 3  . metoprolol succinate (TOPROL-XL) 50 MG 24 hr tablet Take 1 tablet (50 mg total) by mouth daily. Take with or immediately following a meal. 30 tablet 0  . naproxen sodium (ANAPROX) 220 MG tablet Take 220 mg by mouth daily as needed (for pain or headaches).     Marland Kitchen omeprazole (PRILOSEC) 20 MG capsule Take 1 capsule (20 mg total) by mouth daily. 90 capsule 3  . tamoxifen (NOLVADEX) 20 MG tablet TAKE 1 TABLET BY MOUTH  DAILY (Patient taking differently: Take 20 mg by mouth daily. ) 90 tablet 3   No current facility-administered medications for this visit.     Past Medical History:  Diagnosis Date  . Arthritis    left hip  . Breast cancer, right ([place]) 11/2016  . Family history of breast cancer   . Family history of colon cancer   . Family history of pancreatic cancer   . GERD (gastroesophageal reflux disease)   . Non-insulin dependent type 2 diabetes mellitus ([place])   . PAC (premature atrial contraction)   . Personal history of radiation therapy 12/2016  . Premature ventricular contraction   . Seasonal allergies   . Sensitive skin   . Vitamin B12 deficiency     Past Surgical History:  Procedure Laterality Date  . ABDOMINAL HYSTERECTOMY  1979   partial  . BREAST BIOPSY  07/02/2011   Procedure: BREAST BIOPSY WITH NEEDLE LOCALIZATION;  Surgeon: Shann Medal, MD;  Location: [facility];  Service: General;  Laterality: Left;  left  breast atypical hyperplasia needle localization biopsy  . BREAST BIOPSY Left 06/13/2006  . BREAST BIOPSY Right 06/27/2008  . BREAST BIOPSY Right 09/27/2016  . BREAST EXCISIONAL BIOPSY Left 07/02/2011  . BREAST LUMPECTOMY Left 07/04/2006  . BREAST LUMPECTOMY Right 06/26/2008  . BREAST LUMPECTOMY Right 09/27/2016  . BREAST LUMPECTOMY WITH RADIOACTIVE SEED LOCALIZATION Right 11/18/2016   Procedure: RIGHT BREAST LUMPECTOMY WITH RADIOACTIVE SEED LOCALIZATION ERAS PATHWAY;  Surgeon: Alphonsa Overall, MD;  Location:  [facility];  Service: General;  Laterality: Right;  ERAS PATHWAY  . CATARACT EXTRACTION Bilateral   . CHOLECYSTECTOMY, LAPAROSCOPIC  07/07/2001   had appendex taking out at the same time  . ESOPHAGEAL DILATION  2006  . LAPAROSCOPIC APPENDECTOMY  07/07/2001  . TONSILLECTOMY  1942    Social History   Socioeconomic History  . Marital status: Divorced    Spouse name: Marcello Moores  . Number of children: 1  . Years of education: Not on file  . Highest education level: Not on file  Occupational History  . Occupation: retire  Tobacco Use  . Smoking status: Never Smoker  . Smokeless tobacco: Never Used  Substance and Sexual Activity  . Alcohol use: No    Alcohol/week: 0.0 standard drinks  . Drug use: No  . Sexual activity: Not Currently  Other Topics Concern  . Not on file  Social History Narrative  . Not on file   Social Determinants of Health   Financial Resource Strain:   . Difficulty of Paying Living Expenses:   Food Insecurity:   . Worried About Charity fundraiser in the Last Year:   . Arboriculturist in the Last Year:   Transportation Needs:   . Film/video editor (Medical):   Marland Kitchen Lack of Transportation (Non-Medical):   Physical Activity: Insufficiently Active  . Days of Exercise per Week: 2 days  . Minutes of Exercise per Session: 30 min  Stress:   . Feeling of Stress :   Social Connections:   . Frequency of Communication with Friends and Family:   . Frequency of Social Gatherings with Friends and Family:   . Attends Religious Services:   . Active Member of Clubs or Organizations:   . Attends Archivist Meetings:   Marland Kitchen Marital Status:   Intimate Partner Violence:   . Fear of Current or Ex-Partner:   . Emotionally Abused:   Marland Kitchen Physically Abused:   . Sexually Abused:     Family History  Problem Relation Age of Onset  . Colon cancer Mother 60  . Heart attack Mother   . Breast cancer Sister 55  . Heart disease Maternal Uncle        CABG    . Breast cancer Sister 80  . Heart disease Brother   . Other Father        suicide  . Breast cancer Sister 50  . Heart disease Sister   . COPD Brother   . Heart disease Brother   . Breast cancer Other 13       niece - brother's daughter  . Pancreatic cancer Other        brother's son, dx in his 49s    ROS: no fevers or chills, productive cough, hemoptysis, dysphasia, odynophagia, melena, hematochezia, dysuria, hematuria, rash, seizure activity, orthopnea, PND, pedal edema, claudication. Remaining systems are negative.  Physical Exam: Well-developed well-nourished in no acute distress.  Skin is warm and dry.  HEENT is normal.  Neck is supple.  Chest is clear to auscultation with normal expansion.  Cardiovascular exam is regular rate and rhythm.  Abdominal exam nontender or distended. No masses palpated. Extremities show no edema. neuro grossly intact  A/P  1 palpitations-this is felt secondary to PVCs.  Continue flecainide and beta-blocker.  Symptoms well controlled.  2 coronary calcification-no chest pain.  We will continue aspirin.  Pt is intolerant to statins.  3 hypertension-blood pressure elevated; however she follows this at home and it is typically controlled after her a.m. medications.  She states in the 120/70 range.  We will continue present regimen and adjust as needed.  4 carotid artery disease-mild on most recent Dopplers.  Kirk Ruths, MD

## 2019-08-15 ENCOUNTER — Telehealth: Payer: Self-pay

## 2019-08-15 NOTE — Telephone Encounter (Signed)
New message    Calling for test results  

## 2019-08-15 NOTE — Telephone Encounter (Signed)
Her A1c was 6.7 do you recommend any changes at this time or looks good? Just checking before I call

## 2019-08-15 NOTE — Telephone Encounter (Signed)
Pt aware of results 

## 2019-08-15 NOTE — Telephone Encounter (Signed)
No change -sugars controlled

## 2019-08-21 ENCOUNTER — Encounter: Payer: Self-pay | Admitting: Cardiology

## 2019-08-21 ENCOUNTER — Other Ambulatory Visit: Payer: Self-pay

## 2019-08-21 ENCOUNTER — Ambulatory Visit: Payer: Medicare Other | Admitting: Cardiology

## 2019-08-21 VITALS — BP 168/64 | HR 61 | Ht 62.0 in | Wt 132.2 lb

## 2019-08-21 DIAGNOSIS — R002 Palpitations: Secondary | ICD-10-CM

## 2019-08-21 DIAGNOSIS — I6529 Occlusion and stenosis of unspecified carotid artery: Secondary | ICD-10-CM | POA: Diagnosis not present

## 2019-08-21 DIAGNOSIS — I10 Essential (primary) hypertension: Secondary | ICD-10-CM | POA: Diagnosis not present

## 2019-08-21 NOTE — Patient Instructions (Signed)

## 2019-08-22 ENCOUNTER — Ambulatory Visit: Payer: Medicare Other | Admitting: Internal Medicine

## 2019-09-03 ENCOUNTER — Other Ambulatory Visit: Payer: Self-pay | Admitting: Cardiology

## 2019-09-06 ENCOUNTER — Other Ambulatory Visit: Payer: Self-pay

## 2019-09-06 ENCOUNTER — Ambulatory Visit (INDEPENDENT_AMBULATORY_CARE_PROVIDER_SITE_OTHER): Payer: Medicare Other | Admitting: *Deleted

## 2019-09-06 ENCOUNTER — Telehealth: Payer: Self-pay | Admitting: Internal Medicine

## 2019-09-06 DIAGNOSIS — E538 Deficiency of other specified B group vitamins: Secondary | ICD-10-CM

## 2019-09-06 MED ORDER — CYANOCOBALAMIN 1000 MCG/ML IJ SOLN
1000.0000 ug | Freq: Once | INTRAMUSCULAR | Status: AC
  Administered 2019-09-06: 1000 ug via INTRAMUSCULAR

## 2019-09-06 NOTE — Progress Notes (Signed)
°  Chronic Care Management   Note  09/06/2019 Name: Tami Jacobson MRN: [id number] DOB: 1927/11/03  Skie [name] is a 86 y.o. year old female who is a primary care patient of Burns, Claudina Lick, MD. I reached out to Shawnie Dapper by phone today in response to a referral sent by Ms. Saphira N Albo's PCP, Binnie Rail, MD.   [name] was given information about Chronic Care Management services today including:  1. CCM service includes personalized support from designated clinical staff supervised by her physician, including individualized plan of care and coordination with other care providers 2. 24/7 contact phone numbers for assistance for urgent and routine care needs. 3. Service will only be billed when office clinical staff spend 20 minutes or more in a month to coordinate care. 4. Only one practitioner may furnish and bill the service in a calendar month. 5. The patient may stop CCM services at any time (effective at the end of the month) by phone call to the office staff.   Patient agreed to services and verbal consent obtained.   This note is not being shared with the patient for the following reason: To respect privacy (The patient or proxy has requested that the information not be shared).  Follow up plan:   Earney Hamburg [employer] Scheduler

## 2019-09-06 NOTE — Progress Notes (Signed)
Pls cosign for B12 inj../lmb  

## 2019-09-10 ENCOUNTER — Other Ambulatory Visit: Payer: Self-pay | Admitting: Oncology

## 2019-09-10 ENCOUNTER — Ambulatory Visit
Admission: RE | Admit: 2019-09-10 | Discharge: 2019-09-10 | Disposition: A | Discharge status: Home / Self Care | Payer: Medicare Other | Source: Ambulatory Visit | Attending: Oncology | Admitting: Oncology

## 2019-09-10 ENCOUNTER — Other Ambulatory Visit: Payer: Self-pay

## 2019-09-10 DIAGNOSIS — Z853 Personal history of malignant neoplasm of breast: Secondary | ICD-10-CM

## 2019-09-10 DIAGNOSIS — N6489 Other specified disorders of breast: Secondary | ICD-10-CM

## 2019-09-10 DIAGNOSIS — R922 Inconclusive mammogram: Secondary | ICD-10-CM | POA: Diagnosis not present

## 2019-09-13 ENCOUNTER — Ambulatory Visit
Admission: RE | Admit: 2019-09-13 | Discharge: 2019-09-13 | Disposition: A | Discharge status: Home / Self Care | Payer: Medicare Other | Source: Ambulatory Visit | Attending: Oncology | Admitting: Oncology

## 2019-09-13 ENCOUNTER — Other Ambulatory Visit: Payer: Self-pay

## 2019-09-13 DIAGNOSIS — Z853 Personal history of malignant neoplasm of breast: Secondary | ICD-10-CM

## 2019-09-13 DIAGNOSIS — N641 Fat necrosis of breast: Secondary | ICD-10-CM | POA: Diagnosis not present

## 2019-09-13 DIAGNOSIS — L218 Other seborrheic dermatitis: Secondary | ICD-10-CM | POA: Diagnosis not present

## 2019-09-13 DIAGNOSIS — L570 Actinic keratosis: Secondary | ICD-10-CM | POA: Diagnosis not present

## 2019-09-13 DIAGNOSIS — R928 Other abnormal and inconclusive findings on diagnostic imaging of breast: Secondary | ICD-10-CM | POA: Diagnosis not present

## 2019-09-13 DIAGNOSIS — N6489 Other specified disorders of breast: Secondary | ICD-10-CM

## 2019-10-01 ENCOUNTER — Ambulatory Visit: Payer: Medicare Other

## 2019-10-02 ENCOUNTER — Other Ambulatory Visit: Payer: Medicare Other

## 2019-10-02 ENCOUNTER — Ambulatory Visit: Payer: Medicare Other | Admitting: Oncology

## 2019-10-05 ENCOUNTER — Ambulatory Visit (INDEPENDENT_AMBULATORY_CARE_PROVIDER_SITE_OTHER): Payer: Medicare Other

## 2019-10-05 ENCOUNTER — Other Ambulatory Visit: Payer: Self-pay

## 2019-10-05 DIAGNOSIS — E538 Deficiency of other specified B group vitamins: Secondary | ICD-10-CM | POA: Diagnosis not present

## 2019-10-05 MED ORDER — CYANOCOBALAMIN 1000 MCG/ML IJ SOLN
1000.0000 ug | Freq: Once | INTRAMUSCULAR | Status: AC
  Administered 2019-10-05: 1000 ug via INTRAMUSCULAR

## 2019-10-05 NOTE — Progress Notes (Signed)
b12 Injection given.   Keylen Uzelac J Raenah Murley, MD  

## 2019-10-21 NOTE — Progress Notes (Signed)
[place]  Telephone:([phone]) [phone] Fax:(336) [phone]     ID: Tami Jacobson DOB: 1927-12-17  MR#: [id number]  CHY#:[id number]  Patient Care Team: Binnie Rail, MD as PCP - General (Internal Medicine) Stanford Breed Denice Bors, MD as Consulting Physician (Cardiology) Milly Goggins, Virgie Dad, MD as Consulting Physician (Hematology and Oncology) Newton Pigg, MD as Consulting Physician (Obstetrics and Gynecology) Alphonsa Overall, MD as Consulting Physician (General Surgery) Rozetta Nunnery, MD as Consulting Physician (Otolaryngology) Kyung Rudd, MD as Consulting Physician (Radiation Oncology) Delice Bison, Charlestine Massed, NP as Nurse Practitioner (Hematology and Oncology) Luberta Mutter, MD as Consulting Physician (Ophthalmology) Charlton Haws, [facility] as Pharmacist (Pharmacist) OTHER MD:   CHIEF COMPLAINT: Estrogen receptor positive breast cancer  CURRENT TREATMENT:  tamoxifen   INTERVAL HISTORY: [name] returns today for follow-up of her estrogen receptor positive breast cancer.   She continues on tamoxifen.  She has rare hot flashes.  She has vaginal dryness, not vaginal wetness.  She really tolerates this well as far as she is concerned, with no significant side effects  Since her last visit, she underwent bilateral diagnostic mammography with tomography and right breast ultrasonography at [facility] on 09/10/2019 showing: breast density category C; indeterminate 1 cm hypoechoic mass in right breast at 8:30, adjacent to a seroma.  She proceeded to biopsy of the right breast area on 09/13/2019. Pathology [phone]) showed organizing fat necrosis.   REVIEW OF SYSTEMS: Subrena tells me family did come to her "big birthday" which was a week ago.  She is not walking for exercise.  She is feeling a little bit unsteady although she has not had any falls.  She does have a cane at home which she rarely uses.  Blood pressure is an issue and she has been started on  losartan.  She has occasional headaches, which are mild and infrequent.  A detailed review of systems today was otherwise stable   BREAST CANCER HISTORY: From the recent update summary:  The patient had bilateral screening mammography with tomography at the [facility] 06/28/2016 showing only post surgical changes in both breasts. The patient had a history of right lateral breast cancer diagnosed in 2010, T1 cN0, status post lumpectomy March of that year, and she also has a history of T1 CN 0 left breast cancer status post lumpectomy in 2008. She had excisional biopsy on the left of what proved to be atypical lobular hyperplasia March 2013.  On 09/23/2016 the patient was evaluated for pain in the inferior right breast and a feeling of lumpiness. [facility] found the breast density to be category C. In the right breast there was skin thickening around the areola. This appeared unchanged. On tomography however there was a suggestion of a small spiculated mass in the inferior central right breast measuring 0.5 cm. This was palpable at the 5:00 position 2 cm from the nipple. Targeted ultrasonography confirmed an irregular mass in this position measuring 0.8 cm. Ultrasound of the right axilla was negative.  Biopsy of this right breast lower inner quadrant mass 09/27/2016 showed ([id number]) invasive lobular carcinoma, E-cadherin negative, grade not stated, estrogen receptor 80% positive, progesterone receptor 5% positive, both with strong staining intensity, with an MIB-1 of 5%, and no HER-2 amplification, the signals ratio being 1.21 and the number per cell 2.30.  The patient's subsequent history is as detailed below.   PAST MEDICAL HISTORY: Past Medical History:  Diagnosis Date  . Arthritis  left hip  . Breast cancer, right ([place]) 11/2016  . Family history of breast cancer   . Family history of colon cancer   . Family history of  pancreatic cancer   . GERD (gastroesophageal reflux disease)   . Non-insulin dependent type 2 diabetes mellitus ([place])   . PAC (premature atrial contraction)   . Personal history of radiation therapy 12/2016  . Premature ventricular contraction   . Seasonal allergies   . Sensitive skin   . Vitamin B12 deficiency     PAST SURGICAL HISTORY: Past Surgical History:  Procedure Laterality Date  . ABDOMINAL HYSTERECTOMY  1979   partial  . BREAST BIOPSY  07/02/2011   Procedure: BREAST BIOPSY WITH NEEDLE LOCALIZATION;  Surgeon: Shann Medal, MD;  Location: [place];  Service: General;  Laterality: Left;  left breast atypical hyperplasia needle localization biopsy  . BREAST BIOPSY Left 06/13/2006  . BREAST BIOPSY Right 06/27/2008  . BREAST BIOPSY Right 09/27/2016  . BREAST EXCISIONAL BIOPSY Left 07/02/2011  . BREAST LUMPECTOMY Left 07/04/2006  . BREAST LUMPECTOMY Right 06/26/2008  . BREAST LUMPECTOMY Right 09/27/2016  . BREAST LUMPECTOMY WITH RADIOACTIVE SEED LOCALIZATION Right 11/18/2016   Procedure: RIGHT BREAST LUMPECTOMY WITH RADIOACTIVE SEED LOCALIZATION ERAS PATHWAY;  Surgeon: Alphonsa Overall, MD;  Location: [place];  Service: General;  Laterality: Right;  ERAS PATHWAY  . CATARACT EXTRACTION Bilateral   . CHOLECYSTECTOMY, LAPAROSCOPIC  07/07/2001   had appendex taking out at the same time  . ESOPHAGEAL DILATION  2006  . LAPAROSCOPIC APPENDECTOMY  07/07/2001  . TONSILLECTOMY  1942    FAMILY HISTORY Family History  Problem Relation Age of Onset  . Colon cancer Mother 36  . Heart attack Mother   . Breast cancer Sister 29  . Heart disease Maternal Uncle        CABG  . Breast cancer Sister 72  . Heart disease Brother   . Other Father        suicide  . Breast cancer Sister 15  . Heart disease Sister   . COPD Brother   . Heart disease Brother   . Breast cancer Other 23       niece - brother's daughter  . Pancreatic cancer Other        brother's son, dx in his  68s  The patient's father committed suicide at the age of 43. The patient's mother had a history of colon cancer diagnosed at age 21, she died at the age of 36 from unrelated causes. The patient is 65 sisters. 2 sisters died from breast cancer, 1 diagnosed age 29 diagnosed age 4. A third sister was diagnosed with breast cancer at age 83. The patient has 2 brothers. There is no other history of cancer in the family to the patient's knowledge.   GYNECOLOGIC HISTORY:  GX P1, first live birth age 51. The patient took Premarin alone for nearly 30 years, status post hysterectomy. No LMP recorded. Patient has had a hysterectomy.   SOCIAL HISTORY:  She worked as a Network engineer for [employer]. Her husband of more than 78 year, Marcello Moores, "just left me and moved to an assisted living facility.". Their divorce was finalized August 2018. The patient enters me that her daughter Margarita Grizzle is estranged. The patient lives by herself, with no pets. [place] in [place]    ADVANCED DIRECTIVES: Not in place. At the 10/22/2019 visit the patient tells me she has named her friend Tawnya Crook  as her healthcare power of attorney.  We have her phone number on file  HEALTH MAINTENANCE: Social History   Tobacco Use  . Smoking status: Never Smoker  . Smokeless tobacco: Never Used  Vaping Use  . Vaping Use: Never used  Substance Use Topics  . Alcohol use: No    Alcohol/week: 0.0 standard drinks  . Drug use: No     Allergies  Allergen Reactions  . Statins Other (See Comments)    GI UPSET  . Effexor [Venlafaxine]     Constipation, shaking, dizzy  . Cephalexin Other (See Comments)    Reaction not recalled by the patient  . Codeine Other (See Comments)    Reaction not recalled by the patient  . Lansoprazole Other (See Comments)    Reaction not recalled by the patient  . Latex Rash       . Levofloxacin Other (See Comments)    Reaction not recalled by the patient  .  Nitrofurantoin Other (See Comments)    Reaction not recalled by the patient  . Rofecoxib Other (See Comments)    Reaction not recalled by the patient     Current Outpatient Medications  Medication Sig Dispense Refill  . ACCU-CHEK GUIDE test strip USE AS DIRECTED 100 strip 3  . aspirin EC 81 MG tablet Take 81 mg by mouth as needed (for headaches).    . blood glucose meter kit and supplies KIT Dispense based on patient and insurance preference. Check sugars once daily and as needed as directed. 1 each 0  . clobetasol cream (TEMOVATE) 1.06 % Apply 1 application topically as needed (for skin irritation- to affected areas).     . cyanocobalamin (,VITAMIN B-12,) 1000 MCG/ML injection Inject 1,000 mcg into the muscle every 30 (thirty) days.     . diphenhydramine-acetaminophen (TYLENOL PM) 25-500 MG TABS tablet Take 1 tablet by mouth at bedtime as needed (for sleep).    . flecainide (TAMBOCOR) 50 MG tablet TAKE 1 TABLET BY MOUTH TWO  TIMES DAILY (Patient taking differently: Take 50 mg by mouth 2 (two) times daily. ) 180 tablet 3  . losartan (COZAAR) 100 MG tablet Take 1 tablet (100 mg total) by mouth daily. 90 tablet 1  . metFORMIN (GLUCOPHAGE-XR) 500 MG 24 hr tablet TAKE 1 TABLET BY MOUTH ONCE DAILY WITH BREAKFAST (Patient taking differently: Take 500 mg by mouth daily with breakfast. ) 90 tablet 3  . metoprolol succinate (TOPROL-XL) 50 MG 24 hr tablet Take 1 tablet (50 mg total) by mouth daily. Take with or immediately following a meal. 30 tablet 0  . naproxen sodium (ANAPROX) 220 MG tablet Take 220 mg by mouth daily as needed (for pain or headaches).     Marland Kitchen omeprazole (PRILOSEC) 20 MG capsule Take 1 capsule (20 mg total) by mouth daily. 90 capsule 3  . tamoxifen (NOLVADEX) 20 MG tablet TAKE 1 TABLET BY MOUTH  DAILY (Patient taking differently: Take 20 mg by mouth daily. ) 90 tablet 3   No current facility-administered medications for this visit.    OBJECTIVE:  white woman who appears younger than  stated age  77:   10/22/19 1410  BP: (!) 183/67  Pulse: 65  Resp: 18  Temp: 98.5 F (36.9 C)  SpO2: 99%     Body mass index is 24.27 kg/m.   Wt Readings from Last 3 Encounters:  10/22/19 132 lb 11.2 oz (60.2 kg)  08/21/19 132 lb 3.2 oz (60 kg)  08/13/19 130 lb (59 kg)  ECOG FS:1 - Symptomatic but completely ambulatory  Sclerae unicteric, EOMs intact Wearing a mask No cervical or supraclavicular adenopathy Lungs no rales or rhonchi Heart regular rate and rhythm Abd soft, nontender, positive bowel sounds MSK no focal spinal tenderness, no upper extremity lymphedema Neuro: nonfocal, well oriented, appropriate affect Breasts: The right breast is status post lumpectomy and radiation.  The nipple is inverted.  There is no evidence of local recurrence.  The left breast is status post prior biopsies.  There is no evidence of active disease.  Both axillae are benign.   LAB RESULTS:  CMP     Component Value Date/Time   NA 142 10/22/2019 1307   NA 136 02/28/2017 1453   K 3.6 10/22/2019 1307   K 5.1 02/28/2017 1453   CL 106 10/22/2019 1307   CL 106 07/11/2012 0901   CO2 28 10/22/2019 1307   CO2 26 02/28/2017 1453   GLUCOSE 140 (H) 10/22/2019 1307   GLUCOSE 179 (H) 02/28/2017 1453   GLUCOSE 110 (H) 07/11/2012 0901   BUN 13 10/22/2019 1307   BUN 18.2 02/28/2017 1453   CREATININE 1.20 (H) 10/22/2019 1307   CREATININE 1.1 02/28/2017 1453   CALCIUM 9.1 10/22/2019 1307   CALCIUM 10.1 02/28/2017 1453   PROT 6.3 (L) 10/22/2019 1307   PROT 7.6 02/28/2017 1453   ALBUMIN 3.4 (L) 10/22/2019 1307   ALBUMIN 3.9 02/28/2017 1453   AST 18 10/22/2019 1307   AST 12 02/28/2017 1453   ALT 18 10/22/2019 1307   ALT 10 02/28/2017 1453   ALKPHOS 42 10/22/2019 1307   ALKPHOS 84 02/28/2017 1453   BILITOT 0.6 10/22/2019 1307   BILITOT 0.70 02/28/2017 1453   GFRNONAA 40 (L) 10/22/2019 1307   GFRAA 46 (L) 10/22/2019 1307    No results found for: TOTALPROTELP, ALBUMINELP, A1GS,  A2GS, BETS, BETA2SER, GAMS, MSPIKE, SPEI  No results found for: Nils Pyle, [facility]  Lab Results  Component Value Date   WBC 4.6 10/22/2019   NEUTROABS 3.0 10/22/2019   HGB 10.6 (L) 10/22/2019   HCT 32.4 (L) 10/22/2019   MCV 98.5 10/22/2019   PLT 113 (L) 10/22/2019      Chemistry      Component Value Date/Time   NA 142 10/22/2019 1307   NA 136 02/28/2017 1453   K 3.6 10/22/2019 1307   K 5.1 02/28/2017 1453   CL 106 10/22/2019 1307   CL 106 07/11/2012 0901   CO2 28 10/22/2019 1307   CO2 26 02/28/2017 1453   BUN 13 10/22/2019 1307   BUN 18.2 02/28/2017 1453   CREATININE 1.20 (H) 10/22/2019 1307   CREATININE 1.1 02/28/2017 1453      Component Value Date/Time   CALCIUM 9.1 10/22/2019 1307   CALCIUM 10.1 02/28/2017 1453   ALKPHOS 42 10/22/2019 1307   ALKPHOS 84 02/28/2017 1453   AST 18 10/22/2019 1307   AST 12 02/28/2017 1453   ALT 18 10/22/2019 1307   ALT 10 02/28/2017 1453   BILITOT 0.6 10/22/2019 1307   BILITOT 0.70 02/28/2017 1453       Lab Results  Component Value Date   LABCA2 18 10/07/2009    No components found for: [id number]  No results for input(s): INR in the last 168 hours.  Urinalysis    Component Value Date/Time   COLORURINE YELLOW 05/14/2019 1852   APPEARANCEUR CLEAR 05/14/2019 1852   LABSPEC 1.006 05/14/2019 1852   PHURINE 6.0 05/14/2019 1852   GLUCOSEU NEGATIVE 05/14/2019 1852   HGBUR  NEGATIVE 05/14/2019 [place] 05/14/2019 [place] 05/14/2019 1852   PROTEINUR NEGATIVE 05/14/2019 1852   NITRITE NEGATIVE 05/14/2019 1852   LEUKOCYTESUR TRACE (A) 05/14/2019 1852    STUDIES: No results found.   ELIGIBLE FOR AVAILABLE RESEARCH PROTOCOL: no  ASSESSMENT: 86 y.o. [place] woman   (1) status post left lumpectomy March 2008 for a pT1c invasive ductal carcinoma, grade 1, strongly estrogen and progesterone receptor positive, HER-2 not amplified, with a borderline MIB-1  (a) received  tamoxifen, but very intermittently  (2) status post right lumpectomy and sentinel lymph node sampling 06/28/2008 for a pT1c pN0, stage IA invasive ductal carcinoma, grade 1, estrogen receptor 96% positive, progesterone receptor negative, HER-2/neu negative, with an MIB-1 of 14%  (a) received anastrozole April 2010 to April 2013  (3) status post excision of an area of lobular carcinoma in situ, left breast, 07/02/2011  (a) on tamoxifen April 2013 to April 2015.  (4) status post biopsy of the right breast lower inner quadrant 09/27/2016 for a clinical T1a N0 invasive lobular carcinoma, E-cadherin negative, estrogen and progesterone receptor positive, with no HER-2 amplification and an MIB-1 of 5%.  (5) right lumpectomy without sentinel lymph node sampling 11/18/2016 found a pT1b cN0, stage IA invasive lobular breast cancer, grade 2, with negative margins  (6) adjuvant radiation 12/21/2016 - 01/17/2017 Site/dose:   Right breast/ 42.5 Gy in 17 fractions                    Boost: 7.5 Gy in 3 fractions  (7) tamoxifen for 5 years started 03/05/2017  (8) offered genetics testing at meeting with genetics counselor 11/15/2016, but declined   PLAN: Melina is now 3 years out from her definitive surgery for her right-sided breast cancer.  There is no evidence of disease activity.  This is very favorable.  She is tolerating tamoxifen well and the plan is to continue that through November 2023  We reviewed what fat necrosis means and why it is a benign process.  She will have her next mammogram in June and see me again a year from now  Total encounter time 25 minutes.*  Joely Losier, Virgie Dad, MD  10/22/19 2:20 PM Medical Oncology and Hematology [facility] [place], [place] [address] Tel. [phone]    Fax. [phone]   I, Wilburn Mylar, am acting as scribe for Dr. Virgie Dad. Brandt Chaney.  I, Lurline Del MD, have reviewed the above documentation for accuracy  and completeness, and I agree with the above.    *Total Encounter Time as defined by the Centers for Medicare and Medicaid Services includes, in addition to the face-to-face time of a patient visit (documented in the note above) non-face-to-face time: obtaining and reviewing outside history, ordering and reviewing medications, tests or procedures, care coordination (communications with other health care professionals or caregivers) and documentation in the medical record.

## 2019-10-22 ENCOUNTER — Inpatient Hospital Stay: Payer: Medicare Other | Attending: Oncology

## 2019-10-22 ENCOUNTER — Inpatient Hospital Stay: Payer: Medicare Other | Admitting: Oncology

## 2019-10-22 ENCOUNTER — Other Ambulatory Visit: Payer: Self-pay

## 2019-10-22 VITALS — BP 183/67 | HR 65 | Temp 98.5°F | Resp 18 | Ht 62.0 in | Wt 132.7 lb

## 2019-10-22 DIAGNOSIS — Z170 Estrogen receptor positive status [ER+]: Secondary | ICD-10-CM | POA: Diagnosis not present

## 2019-10-22 DIAGNOSIS — C50311 Malignant neoplasm of lower-inner quadrant of right female breast: Secondary | ICD-10-CM | POA: Insufficient documentation

## 2019-10-22 DIAGNOSIS — Z7982 Long term (current) use of aspirin: Secondary | ICD-10-CM | POA: Diagnosis not present

## 2019-10-22 DIAGNOSIS — Z79899 Other long term (current) drug therapy: Secondary | ICD-10-CM | POA: Diagnosis not present

## 2019-10-22 DIAGNOSIS — Z800 Family history of malignant neoplasm of digestive organs: Secondary | ICD-10-CM | POA: Insufficient documentation

## 2019-10-22 DIAGNOSIS — Z853 Personal history of malignant neoplasm of breast: Secondary | ICD-10-CM

## 2019-10-22 DIAGNOSIS — Z803 Family history of malignant neoplasm of breast: Secondary | ICD-10-CM | POA: Insufficient documentation

## 2019-10-22 DIAGNOSIS — Z7984 Long term (current) use of oral hypoglycemic drugs: Secondary | ICD-10-CM | POA: Diagnosis not present

## 2019-10-22 DIAGNOSIS — Z923 Personal history of irradiation: Secondary | ICD-10-CM | POA: Diagnosis not present

## 2019-10-22 DIAGNOSIS — Z8249 Family history of ischemic heart disease and other diseases of the circulatory system: Secondary | ICD-10-CM | POA: Diagnosis not present

## 2019-10-22 DIAGNOSIS — Z79810 Long term (current) use of selective estrogen receptor modulators (SERMs): Secondary | ICD-10-CM | POA: Insufficient documentation

## 2019-10-22 LAB — CBC WITH DIFFERENTIAL/PLATELET
Abs Immature Granulocytes: 0.01 10*3/uL (ref 0.00–0.07)
Basophils Absolute: 0 10*3/uL (ref 0.0–0.1)
Basophils Relative: 0 %
Eosinophils Absolute: 0.1 10*3/uL (ref 0.0–0.5)
Eosinophils Relative: 2 %
HCT: 32.4 % — ABNORMAL LOW (ref 36.0–46.0)
Hemoglobin: 10.6 g/dL — ABNORMAL LOW (ref 12.0–15.0)
Immature Granulocytes: 0 %
Lymphocytes Relative: 24 %
Lymphs Abs: 1.1 10*3/uL (ref 0.7–4.0)
MCH: 32.2 pg (ref 26.0–34.0)
MCHC: 32.7 g/dL (ref 30.0–36.0)
MCV: 98.5 fL (ref 80.0–100.0)
Monocytes Absolute: 0.4 10*3/uL (ref 0.1–1.0)
Monocytes Relative: 9 %
Neutro Abs: 3 10*3/uL (ref 1.7–7.7)
Neutrophils Relative %: 65 %
Platelets: 113 10*3/uL — ABNORMAL LOW (ref 150–400)
RBC: 3.29 MIL/uL — ABNORMAL LOW (ref 3.87–5.11)
RDW: 12.7 % (ref 11.5–15.5)
WBC: 4.6 10*3/uL (ref 4.0–10.5)
nRBC: 0 % (ref 0.0–0.2)

## 2019-10-22 LAB — COMPREHENSIVE METABOLIC PANEL
ALT: 18 U/L (ref 0–44)
AST: 18 U/L (ref 15–41)
Albumin: 3.4 g/dL — ABNORMAL LOW (ref 3.5–5.0)
Alkaline Phosphatase: 42 U/L (ref 38–126)
Anion gap: 8 (ref 5–15)
BUN: 13 mg/dL (ref 8–23)
CO2: 28 mmol/L (ref 22–32)
Calcium: 9.1 mg/dL (ref 8.9–10.3)
Chloride: 106 mmol/L (ref 98–111)
Creatinine, Ser: 1.2 mg/dL — ABNORMAL HIGH (ref 0.44–1.00)
GFR calc Af Amer: 46 mL/min — ABNORMAL LOW (ref >60)
GFR calc non Af Amer: 40 mL/min — ABNORMAL LOW (ref >60)
Glucose, Bld: 140 mg/dL — ABNORMAL HIGH (ref 70–99)
Potassium: 3.6 mmol/L (ref 3.5–5.1)
Sodium: 142 mmol/L (ref 135–145)
Total Bilirubin: 0.6 mg/dL (ref 0.3–1.2)
Total Protein: 6.3 g/dL — ABNORMAL LOW (ref 6.5–8.1)

## 2019-10-26 ENCOUNTER — Other Ambulatory Visit: Payer: Self-pay

## 2019-10-26 MED ORDER — FLECAINIDE ACETATE 50 MG PO TABS: 50.0000 mg | ORAL_TABLET | Freq: Two times a day (BID) | ORAL | 3 refills | Status: DC

## 2019-10-29 ENCOUNTER — Encounter: Payer: Self-pay | Admitting: Internal Medicine

## 2019-10-29 ENCOUNTER — Other Ambulatory Visit: Payer: Self-pay

## 2019-10-29 ENCOUNTER — Ambulatory Visit (INDEPENDENT_AMBULATORY_CARE_PROVIDER_SITE_OTHER): Payer: Medicare Other | Admitting: Internal Medicine

## 2019-10-29 DIAGNOSIS — R519 Headache, unspecified: Secondary | ICD-10-CM | POA: Insufficient documentation

## 2019-10-29 DIAGNOSIS — I10 Essential (primary) hypertension: Secondary | ICD-10-CM | POA: Diagnosis not present

## 2019-10-29 MED ORDER — HYDRALAZINE HCL 25 MG PO TABS
25.0000 mg | ORAL_TABLET | Freq: Three times a day (TID) | ORAL | 5 refills | Status: DC
Start: 2019-10-29 — End: 2019-11-29

## 2019-10-29 MED ORDER — NAPROXEN SODIUM 220 MG PO TABS: 220.0000 mg | ORAL_TABLET | Freq: Every day | ORAL | 0 refills | Status: DC | PRN

## 2019-10-29 NOTE — Patient Instructions (Addendum)
Call your eye doctor for a sooner appointment.     Medications reviewed and updated.  Changes include :   Start hydralazine three times a day.  Monitor your BP at home.   Take naprosyn daily as needed for headaches.   Your prescription(s) have been submitted to your pharmacy. Please take as directed and contact our office if you believe you are having problem(s) with the medication(s).    Please followup in 1 week

## 2019-10-29 NOTE — Assessment & Plan Note (Signed)
Chronic Not ideally controlled Start hydralazine 25 mg TID Continue metoprolol and losartan at current doses Monitor at home F/u in one week

## 2019-10-29 NOTE — Assessment & Plan Note (Signed)
Acute Started a few days ago BP elevated, intermittent blurry vision Has had similar symptoms a few months ago - CT at that time was normal Start naprosyn daily prn headaches Will see her eye doctor Will get BP better controlled

## 2019-10-29 NOTE — Progress Notes (Signed)
Subjective:    Patient ID: Tami Jacobson, female    DOB: 04/30/1927, 86 y.o.   MRN: [id number]  HPI The patient is here for an acute visit.  Her BP was high at home yesterday.  It was 142/? In the afternoon.  Last night her BP machine was saying error several times. It was high last night, but she does not know the number. This morning it was 196/84.  Prior to that it was well controlled.  Occasionally she would get a 160.  It usually is 120-150/?Marland Kitchen  She did take her medications yesterday. She denies taking any medication over-the-counter or doing anything else differently.  She has had intermittent blurry vision and is concerned her eyes could be causing the headache. She does have an appointment with her eye doctor next month, but think she should get in sooner. She did have an episode of headache and intermittent blurry vision in February for which she did go to the hospital. Her blood pressure was elevated at that time as well. A CT of her head was within normal limits at that time. She does not recall if she has had had these symptoms more than these 2 times.  She states the headache started last Thursday. It is located behind her eyes and is fairly constant. She has taken Tylenol Extra Strength and Tylenol sinus headache and they have not helped much. She denies any falls or head trauma. She feels the headache just came on for no reason. It is not usual for her to have a headache. She denies any numbness/tingling or weakness in her arms or legs. She denies any difficulty speaking, swallowing.   Her balance is getting worse.  She has not been walking as much inside.       Medications and allergies reviewed with patient and updated if appropriate.  Patient Active Problem List   Diagnosis Date Noted  . Palpitations 05/23/2019  . Chronic diastolic CHF (congestive heart failure) ([place]) 05/14/2019  . Hypertensive urgency 05/14/2019  . Mild tricuspid regurgitation 05/13/2019  . LVH  (left ventricular hypertrophy), mild 05/13/2019  . Memory difficulty 12/05/2018  . Genetic testing 08/30/2017  . Depression 06/01/2017  . Family history of breast cancer   . Family history of colon cancer   . Family history of pancreatic cancer   . Malignant neoplasm of lower-inner quadrant of right breast of female, estrogen receptor positive ([place]) 10/28/2016  . Abnormal electrocardiogram 05/05/2015  . BPPV (benign paroxysmal positional vertigo) 10/01/2014  . Murmur 04/18/2014  . Anal fissure 12/16/2011  . Fibrocystic breast changes. Left.  Biopsy 07/02/2011. 07/14/2011  . History of breast cancer, Right, T1c, N0, lumpectomy 06/26/2008.   Left, T1c, N0, lumpectomy 06/24/2006. 05/12/2011  . Diabetes mellitus type 2, controlled ([place]) 06/11/2009  . PREMATURE VENTRICULAR CONTRACTIONS 08/19/2008  . B12 deficiency 08/29/2007  . ESOPHAGEAL STRICTURE 04/27/2007  . GERD (gastroesophageal reflux disease) 04/27/2007  . DIVERTICULOSIS, COLON 04/27/2007  . OTHER ALOPECIA 02/14/2007  . Dyslipidemia 01/02/2007  . Essential hypertension 01/02/2007  . Carotid stenosis 08/22/2006    Current Outpatient Medications on File Prior to Visit  Medication Sig Dispense Refill  . ACCU-CHEK GUIDE test strip USE AS DIRECTED 100 strip 3  . aspirin EC 81 MG tablet Take 81 mg by mouth as needed (for headaches).    . blood glucose meter kit and supplies KIT Dispense based on patient and insurance preference. Check sugars once daily and as needed as directed. 1 each 0  .  clobetasol cream (TEMOVATE) 4.65 % Apply 1 application topically as needed (for skin irritation- to affected areas).     . cyanocobalamin (,VITAMIN B-12,) 1000 MCG/ML injection Inject 1,000 mcg into the muscle every 30 (thirty) days.     . diphenhydramine-acetaminophen (TYLENOL PM) 25-500 MG TABS tablet Take 1 tablet by mouth at bedtime as needed (for sleep).    . flecainide (TAMBOCOR) 50 MG tablet Take 1 tablet (50 mg total) by mouth 2 (two) times  daily. 180 tablet 3  . losartan (COZAAR) 100 MG tablet Take 1 tablet (100 mg total) by mouth daily. 90 tablet 1  . metFORMIN (GLUCOPHAGE-XR) 500 MG 24 hr tablet TAKE 1 TABLET BY MOUTH ONCE DAILY WITH BREAKFAST (Patient taking differently: Take 500 mg by mouth daily with breakfast. ) 90 tablet 3  . metoprolol succinate (TOPROL-XL) 50 MG 24 hr tablet Take 1 tablet (50 mg total) by mouth daily. Take with or immediately following a meal. 30 tablet 0  . naproxen sodium (ANAPROX) 220 MG tablet Take 220 mg by mouth daily as needed (for pain or headaches).     Marland Kitchen omeprazole (PRILOSEC) 20 MG capsule Take 1 capsule (20 mg total) by mouth daily. 90 capsule 3  . tamoxifen (NOLVADEX) 20 MG tablet TAKE 1 TABLET BY MOUTH  DAILY (Patient taking differently: Take 20 mg by mouth daily. ) 90 tablet 3  . prednisoLONE acetate (PRED FORTE) 1 % ophthalmic suspension prednisolone acetate 1 % eye drops,suspension  INSTILL 1 DROP INTO EACH EYE THREE TIMES DAILY FOR 1 WEEK THEN 1 DROP TWICE DAILY FOR SECOND & THIRD WEEK     No current facility-administered medications on file prior to visit.    Past Medical History:  Diagnosis Date  . Arthritis    left hip  . Breast cancer, right ([place]) 11/2016  . Family history of breast cancer   . Family history of colon cancer   . Family history of pancreatic cancer   . GERD (gastroesophageal reflux disease)   . Non-insulin dependent type 2 diabetes mellitus ([place])   . PAC (premature atrial contraction)   . Personal history of radiation therapy 12/2016  . Premature ventricular contraction   . Seasonal allergies   . Sensitive skin   . Vitamin B12 deficiency     Past Surgical History:  Procedure Laterality Date  . ABDOMINAL HYSTERECTOMY  1979   partial  . BREAST BIOPSY  07/02/2011   Procedure: BREAST BIOPSY WITH NEEDLE LOCALIZATION;  Surgeon: Shann Medal, MD;  Location: [facility];  Service: General;  Laterality: Left;  left breast atypical hyperplasia needle localization biopsy   . BREAST BIOPSY Left 06/13/2006  . BREAST BIOPSY Right 06/27/2008  . BREAST BIOPSY Right 09/27/2016  . BREAST EXCISIONAL BIOPSY Left 07/02/2011  . BREAST LUMPECTOMY Left 07/04/2006  . BREAST LUMPECTOMY Right 06/26/2008  . BREAST LUMPECTOMY Right 09/27/2016  . BREAST LUMPECTOMY WITH RADIOACTIVE SEED LOCALIZATION Right 11/18/2016   Procedure: RIGHT BREAST LUMPECTOMY WITH RADIOACTIVE SEED LOCALIZATION ERAS PATHWAY;  Surgeon: Alphonsa Overall, MD;  Location: [facility];  Service: General;  Laterality: Right;  ERAS PATHWAY  . CATARACT EXTRACTION Bilateral   . CHOLECYSTECTOMY, LAPAROSCOPIC  07/07/2001   had appendex taking out at the same time  . ESOPHAGEAL DILATION  2006  . LAPAROSCOPIC APPENDECTOMY  07/07/2001  . TONSILLECTOMY  1942    Social History   Socioeconomic History  . Marital status: Divorced    Spouse name: Marcello Moores  . Number of children: 1  .  Years of education: Not on file  . Highest education level: Not on file  Occupational History  . Occupation: retire  Tobacco Use  . Smoking status: Never Smoker  . Smokeless tobacco: Never Used  Vaping Use  . Vaping Use: Never used  Substance and Sexual Activity  . Alcohol use: No    Alcohol/week: 0.0 standard drinks  . Drug use: No  . Sexual activity: Not Currently  Other Topics Concern  . Not on file  Social History Narrative  . Not on file   Social Determinants of Health   Financial Resource Strain:   . Difficulty of Paying Living Expenses:   Food Insecurity:   . Worried About Charity fundraiser in the Last Year:   . Arboriculturist in the Last Year:   Transportation Needs:   . Film/video editor (Medical):   Marland Kitchen Lack of Transportation (Non-Medical):   Physical Activity:   . Days of Exercise per Week:   . Minutes of Exercise per Session:   Stress:   . Feeling of Stress :   Social Connections:   . Frequency of Communication with Friends and Family:   . Frequency of Social Gatherings with Friends  and Family:   . Attends Religious Services:   . Active Member of Clubs or Organizations:   . Attends Archivist Meetings:   Marland Kitchen Marital Status:     Family History  Problem Relation Age of Onset  . Colon cancer Mother 59  . Heart attack Mother   . Breast cancer Sister 61  . Heart disease Maternal Uncle        CABG  . Breast cancer Sister 71  . Heart disease Brother   . Other Father        suicide  . Breast cancer Sister 63  . Heart disease Sister   . COPD Brother   . Heart disease Brother   . Breast cancer Other 71       niece - brother's daughter  . Pancreatic cancer Other        brother's son, dx in his 70s    Review of Systems  Constitutional: Negative for chills and fever.  HENT: Positive for rhinorrhea. Negative for congestion, sinus pain and sore throat.   Eyes: Positive for visual disturbance (sees blurry vision occ - new).  Respiratory: Negative for cough, shortness of breath and wheezing.   Cardiovascular: Positive for leg swelling (occ). Negative for chest pain and palpitations.  Neurological: Positive for light-headedness and headaches. Negative for weakness and numbness.       Objective:   Vitals:   10/29/19 1123  BP: (!) 164/84  Pulse: 62  Temp: 98 F (36.7 C)  SpO2: 97%   BP Readings from Last 3 Encounters:  10/29/19 (!) 164/84  10/22/19 (!) 183/67  08/21/19 (!) 168/64   Wt Readings from Last 3 Encounters:  10/29/19 128 lb (58.1 kg)  10/22/19 132 lb 11.2 oz (60.2 kg)  08/21/19 132 lb 3.2 oz (60 kg)   Body mass index is 23.41 kg/m.   Physical Exam Constitutional:      General: She is not in acute distress.    Appearance: Normal appearance. She is not ill-appearing.  HENT:     Head: Normocephalic and atraumatic.     Mouth/Throat:     Mouth: Mucous membranes are moist.     Pharynx: No posterior oropharyngeal erythema.  Eyes:     Extraocular Movements: Extraocular movements intact.  Cardiovascular:     Rate and Rhythm: Normal  rate and regular rhythm.     Heart sounds: Murmur (2/6 systolic) heard.   Pulmonary:     Effort: Pulmonary effort is normal. No respiratory distress.     Breath sounds: Normal breath sounds. No wheezing or rales.  Musculoskeletal:     Cervical back: Neck supple. No tenderness.     Right lower leg: No edema.     Left lower leg: No edema.  Lymphadenopathy:     Cervical: No cervical adenopathy.  Skin:    General: Skin is warm and dry.  Neurological:     General: No focal deficit present.     Mental Status: She is alert.     Cranial Nerves: No cranial nerve deficit.     Sensory: No sensory deficit.     Motor: No weakness.            Assessment & Plan:    See Problem List for Assessment and Plan of chronic medical problems.    This visit occurred during the SARS-CoV-2 public health emergency.  Safety protocols were in place, including screening questions prior to the visit, additional usage of staff PPE, and extensive cleaning of exam room while observing appropriate contact time as indicated for disinfecting solutions.

## 2019-10-30 ENCOUNTER — Other Ambulatory Visit: Payer: Self-pay | Admitting: Internal Medicine

## 2019-10-30 DIAGNOSIS — R519 Headache, unspecified: Secondary | ICD-10-CM | POA: Diagnosis not present

## 2019-10-31 ENCOUNTER — Ambulatory Visit: Payer: Medicare Other | Admitting: Internal Medicine

## 2019-11-04 NOTE — Progress Notes (Signed)
Subjective:    Patient ID: Tami Jacobson, female    DOB: Jan 15, 1928, 86 y.o.   MRN: [id number]  HPI The patient is here for follow up of their chronic medical problems, including htn, headaches, B12 def.    She started hydralazine one week ago.  She is taking all of her medications as prescribed.  BP at home - 154/73 116/66, 158/70, 158/69, 147/76, 147/69, 164/83, 127/70.  She uses a wrist cuff and has a here today.  It does not seem to be accurate with what we are getting.  She continues to have a headache for three weeks.  Her left ear is stopped up.  Her left nostril is stopped up.  She has pressure in her left eyebrow region.    Her chronic anemia was slightly worse 2 weeks ago.    Medications and allergies reviewed with patient and updated if appropriate.  Patient Active Problem List   Diagnosis Date Noted   Headache 10/29/2019   Palpitations 05/23/2019   Chronic diastolic CHF (congestive heart failure) ([place]) 05/14/2019   Hypertensive urgency 05/14/2019   Mild tricuspid regurgitation 05/13/2019   LVH (left ventricular hypertrophy), mild 05/13/2019   Memory difficulty 12/05/2018   Genetic testing 08/30/2017   Depression 06/01/2017   Family history of breast cancer    Family history of colon cancer    Family history of pancreatic cancer    Malignant neoplasm of lower-inner quadrant of right breast of female, estrogen receptor positive ([place]) 10/28/2016   Abnormal electrocardiogram 05/05/2015   BPPV (benign paroxysmal positional vertigo) 10/01/2014   Murmur 04/18/2014   Anal fissure 12/16/2011   Fibrocystic breast changes. Left.  Biopsy 07/02/2011. 07/14/2011   History of breast cancer, Right, T1c, N0, lumpectomy 06/26/2008.   Left, T1c, N0, lumpectomy 06/24/2006. 05/12/2011   Diabetes mellitus type 2, controlled ([place]) 06/11/2009   PREMATURE VENTRICULAR CONTRACTIONS 08/19/2008   B12 deficiency 08/29/2007   ESOPHAGEAL STRICTURE 04/27/2007    GERD (gastroesophageal reflux disease) 04/27/2007   DIVERTICULOSIS, COLON 04/27/2007   OTHER ALOPECIA 02/14/2007   Dyslipidemia 01/02/2007   Essential hypertension 01/02/2007   Carotid stenosis 08/22/2006    Current Outpatient Medications on File Prior to Visit  Medication Sig Dispense Refill   ACCU-CHEK GUIDE test strip USE AS DIRECTED 100 strip 3   aspirin EC 81 MG tablet Take 81 mg by mouth as needed (for headaches).     blood glucose meter kit and supplies KIT Dispense based on patient and insurance preference. Check sugars once daily and as needed as directed. 1 each 0   clobetasol cream (TEMOVATE) 2.11 % Apply 1 application topically as needed (for skin irritation- to affected areas).      cyanocobalamin (,VITAMIN B-12,) 1000 MCG/ML injection Inject 1,000 mcg into the muscle every 30 (thirty) days.      diphenhydramine-acetaminophen (TYLENOL PM) 25-500 MG TABS tablet Take 1 tablet by mouth at bedtime as needed (for sleep).     flecainide (TAMBOCOR) 50 MG tablet Take 1 tablet (50 mg total) by mouth 2 (two) times daily. 180 tablet 3   hydrALAZINE (APRESOLINE) 25 MG tablet Take 1 tablet (25 mg total) by mouth 3 (three) times daily. 90 tablet 5   losartan (COZAAR) 100 MG tablet TAKE 1 TABLET BY MOUTH  DAILY 90 tablet 3   metFORMIN (GLUCOPHAGE-XR) 500 MG 24 hr tablet TAKE 1 TABLET BY MOUTH ONCE DAILY WITH BREAKFAST (Patient taking differently: Take 500 mg by mouth daily with breakfast. ) 90 tablet 3  metoprolol succinate (TOPROL-XL) 50 MG 24 hr tablet Take 1 tablet (50 mg total) by mouth daily. Take with or immediately following a meal. 30 tablet 0   naproxen sodium (ALEVE) 220 MG tablet Take 1 tablet (220 mg total) by mouth daily as needed (for pain or headaches). 30 tablet 0   omeprazole (PRILOSEC) 20 MG capsule Take 1 capsule (20 mg total) by mouth daily. 90 capsule 3   prednisoLONE acetate (PRED FORTE) 1 % ophthalmic suspension prednisolone acetate 1 % eye  drops,suspension  INSTILL 1 DROP INTO EACH EYE THREE TIMES DAILY FOR 1 WEEK THEN 1 DROP TWICE DAILY FOR SECOND & THIRD WEEK     tamoxifen (NOLVADEX) 20 MG tablet TAKE 1 TABLET BY MOUTH  DAILY (Patient taking differently: Take 20 mg by mouth daily. ) 90 tablet 3   No current facility-administered medications on file prior to visit.    Past Medical History:  Diagnosis Date   Arthritis    left hip   Breast cancer, right ([place]) 11/2016   Family history of breast cancer    Family history of colon cancer    Family history of pancreatic cancer    GERD (gastroesophageal reflux disease)    Non-insulin dependent type 2 diabetes mellitus ([place])    PAC (premature atrial contraction)    Personal history of radiation therapy 12/2016   Premature ventricular contraction    Seasonal allergies    Sensitive skin    Vitamin B12 deficiency     Past Surgical History:  Procedure Laterality Date   ABDOMINAL HYSTERECTOMY  1979   partial   BREAST BIOPSY  07/02/2011   Procedure: BREAST BIOPSY WITH NEEDLE LOCALIZATION;  Surgeon: Shann Medal, MD;  Location: [facility];  Service: General;  Laterality: Left;  left breast atypical hyperplasia needle localization biopsy   BREAST BIOPSY Left 06/13/2006   BREAST BIOPSY Right 06/27/2008   BREAST BIOPSY Right 09/27/2016   BREAST EXCISIONAL BIOPSY Left 07/02/2011   BREAST LUMPECTOMY Left 07/04/2006   BREAST LUMPECTOMY Right 06/26/2008   BREAST LUMPECTOMY Right 09/27/2016   BREAST LUMPECTOMY WITH RADIOACTIVE SEED LOCALIZATION Right 11/18/2016   Procedure: RIGHT BREAST LUMPECTOMY WITH RADIOACTIVE SEED LOCALIZATION ERAS PATHWAY;  Surgeon: Alphonsa Overall, MD;  Location: [facility];  Service: General;  Laterality: Right;  ERAS PATHWAY   CATARACT EXTRACTION Bilateral    CHOLECYSTECTOMY, LAPAROSCOPIC  07/07/2001   had appendex taking out at the same time   ESOPHAGEAL DILATION  2006   LAPAROSCOPIC APPENDECTOMY  07/07/2001    TONSILLECTOMY  1942    Social History   Socioeconomic History   Marital status: Divorced    Spouse name: Marcello Moores   Number of children: 1   Years of education: Not on file   Highest education level: Not on file  Occupational History   Occupation: retire  Tobacco Use   Smoking status: Never Smoker   Smokeless tobacco: Never Used  Scientific laboratory technician Use: Never used  Substance and Sexual Activity   Alcohol use: No    Alcohol/week: 0.0 standard drinks   Drug use: No   Sexual activity: Not Currently  Other Topics Concern   Not on file  Social History Narrative   Not on file   Social Determinants of Health   Financial Resource Strain:    Difficulty of Paying Living Expenses:   Food Insecurity:    Worried About [place] in the Last Year:    [place] in the  Last Year:   Transportation Needs:    Film/video editor (Medical):    Lack of Transportation (Non-Medical):   Physical Activity:    Days of Exercise per Week:    Minutes of Exercise per Session:   Stress:    Feeling of Stress :   Social Connections:    Frequency of Communication with Friends and Family:    Frequency of Social Gatherings with Friends and Family:    Attends Religious Services:    Active Member of Clubs or Organizations:    Attends Music therapist:    Marital Status:     Family History  Problem Relation Age of Onset   Colon cancer Mother 36   Heart attack Mother    Breast cancer Sister 31   Heart disease Maternal Uncle        CABG   Breast cancer Sister 25   Heart disease Brother    Other Father        suicide   Breast cancer Sister 17   Heart disease Sister    COPD Brother    Heart disease Brother    Breast cancer Other 66       niece - brother's daughter   Pancreatic cancer Other        brother's son, dx in his 69s    Review of Systems  Constitutional: Negative for chills and fever.  HENT: Positive for  congestion, postnasal drip (chronic) and sinus pain (left eyebrow). Negative for sore throat.   Respiratory: Negative for cough, shortness of breath and wheezing.   Cardiovascular: Negative for chest pain and palpitations.  Neurological: Positive for headaches. Dizziness: if she turns quick.       Objective:   Vitals:   11/05/19 1329  BP: (!) 146/82  Pulse: 66  Temp: 98.2 F (36.8 C)  SpO2: 95%   BP Readings from Last 3 Encounters:  11/05/19 (!) 146/82  10/29/19 (!) 164/84  10/22/19 (!) 183/67   Wt Readings from Last 3 Encounters:  11/05/19 127 lb (57.6 kg)  10/29/19 128 lb (58.1 kg)  10/22/19 132 lb 11.2 oz (60.2 kg)   Body mass index is 23.23 kg/m.   Physical Exam    GENERAL APPEARANCE: Appears stated age, well appearing, NAD EYES: conjunctiva clear, no icterus HEENT: Right ear canal normal.  Right tympanic membrane normal.  Excessive cerumen in left ear canal and TM not visualized, remainder of left ear canal normal, oropharynx with no erythema, no thyromegaly, trachea midline, no cervical or supraclavicular lymphadenopathy, some sinus tenderness in face with palpation LUNGS: Clear to auscultation without wheeze or crackles, unlabored breathing, good air entry bilaterally CARDIOVASCULAR: Normal S1,S2 without murmurs, no edema Neuro: Nonfocal SKIN: Warm, dry   Assessment & Plan:    See Problem List for Assessment and Plan of chronic medical problems.    This visit occurred during the SARS-CoV-2 public health emergency.  Safety protocols were in place, including screening questions prior to the visit, additional usage of staff PPE, and extensive cleaning of exam room while observing appropriate contact time as indicated for disinfecting solutions.

## 2019-11-04 NOTE — Patient Instructions (Addendum)
  Blood work was ordered.     Medications reviewed and updated.  Changes include :   Doxycycline ( antibiotic) and steroid taper  Your prescription(s) have been submitted to your pharmacy. Please take as directed and contact our office if you believe you are having problem(s) with the medication(s).  A CT scan was ordered of your head   You had a B12 injection today.    Follow up if your symptoms do not improve

## 2019-11-05 ENCOUNTER — Other Ambulatory Visit: Payer: Self-pay

## 2019-11-05 ENCOUNTER — Encounter: Payer: Self-pay | Admitting: Internal Medicine

## 2019-11-05 ENCOUNTER — Ambulatory Visit: Payer: Medicare Other

## 2019-11-05 ENCOUNTER — Ambulatory Visit (INDEPENDENT_AMBULATORY_CARE_PROVIDER_SITE_OTHER): Payer: Medicare Other | Admitting: Internal Medicine

## 2019-11-05 VITALS — BP 146/82 | HR 66 | Temp 98.2°F | Ht 62.0 in | Wt 127.0 lb

## 2019-11-05 DIAGNOSIS — N1832 Chronic kidney disease, stage 3b: Secondary | ICD-10-CM

## 2019-11-05 DIAGNOSIS — E538 Deficiency of other specified B group vitamins: Secondary | ICD-10-CM | POA: Diagnosis not present

## 2019-11-05 DIAGNOSIS — J329 Chronic sinusitis, unspecified: Secondary | ICD-10-CM | POA: Insufficient documentation

## 2019-11-05 DIAGNOSIS — R519 Headache, unspecified: Secondary | ICD-10-CM

## 2019-11-05 DIAGNOSIS — I10 Essential (primary) hypertension: Secondary | ICD-10-CM

## 2019-11-05 DIAGNOSIS — J0110 Acute frontal sinusitis, unspecified: Secondary | ICD-10-CM

## 2019-11-05 DIAGNOSIS — D631 Anemia in chronic kidney disease: Secondary | ICD-10-CM

## 2019-11-05 MED ORDER — DOXYCYCLINE HYCLATE 100 MG PO TABS
100.0000 mg | ORAL_TABLET | Freq: Two times a day (BID) | ORAL | 0 refills | Status: DC
Start: 2019-11-05 — End: 2019-11-27

## 2019-11-05 MED ORDER — CYANOCOBALAMIN 1000 MCG/ML IJ SOLN
1000.0000 ug | Freq: Once | INTRAMUSCULAR | Status: AC
  Administered 2019-11-05: 1000 ug via INTRAMUSCULAR

## 2019-11-05 MED ORDER — PREDNISONE 10 MG PO TABS: ORAL_TABLET | ORAL | 0 refills | Status: DC

## 2019-11-05 NOTE — Assessment & Plan Note (Signed)
Chronic B12 injections monthly Injection due today

## 2019-11-05 NOTE — Addendum Note (Signed)
Addended by: Marcina Millard on: 11/05/2019 04:59 PM   Modules accepted: Orders

## 2019-11-05 NOTE — Assessment & Plan Note (Addendum)
Acute Persistent for 3 weeks ? Sinus infection versus other cause Will treat sinus infection and hopefully that will help Given that it has been persistent for 3 weeks will get a CT scan-CT scan earlier this year was normal for age-small vessel disease only, nothing acute.  There is no trauma, but at this point I feel like a CT is warranted Medrol dose pack for intractable headache Return if no improvement

## 2019-11-05 NOTE — Assessment & Plan Note (Addendum)
Chronic BP better here today and ok for her age ? Her BP monitor accurate - it is a wrist cuff and does not seem to be accurate-advised to discontinue using this and possibly get a new cuff Continue current medications at current doses

## 2019-11-05 NOTE — Assessment & Plan Note (Signed)
Acute Some of her symptoms are concerning for sinus infection-this could also be causing the headache Start doxycycline twice daily Medrol Dosepak for sinus pain/headache Sees ENT on Friday-hopefully her symptoms will improve by then Follow-up with me if she continues to have symptoms

## 2019-11-06 ENCOUNTER — Ambulatory Visit (INDEPENDENT_AMBULATORY_CARE_PROVIDER_SITE_OTHER)
Admission: RE | Admit: 2019-11-06 | Discharge: 2019-11-06 | Disposition: A | Discharge status: Home / Self Care | Payer: Medicare Other | Source: Ambulatory Visit | Attending: Internal Medicine | Admitting: Internal Medicine

## 2019-11-06 DIAGNOSIS — R519 Headache, unspecified: Secondary | ICD-10-CM | POA: Diagnosis not present

## 2019-11-08 ENCOUNTER — Other Ambulatory Visit: Payer: Self-pay

## 2019-11-08 ENCOUNTER — Other Ambulatory Visit: Payer: Medicare Other

## 2019-11-08 DIAGNOSIS — C50911 Malignant neoplasm of unspecified site of right female breast: Secondary | ICD-10-CM | POA: Diagnosis not present

## 2019-11-08 DIAGNOSIS — Z170 Estrogen receptor positive status [ER+]: Secondary | ICD-10-CM | POA: Diagnosis not present

## 2019-11-09 ENCOUNTER — Ambulatory Visit (INDEPENDENT_AMBULATORY_CARE_PROVIDER_SITE_OTHER): Payer: Medicare Other | Admitting: Otolaryngology

## 2019-11-09 ENCOUNTER — Encounter (INDEPENDENT_AMBULATORY_CARE_PROVIDER_SITE_OTHER): Payer: Self-pay | Admitting: Otolaryngology

## 2019-11-09 ENCOUNTER — Telehealth: Payer: Self-pay

## 2019-11-09 VITALS — Temp 97.3°F

## 2019-11-09 DIAGNOSIS — H6122 Impacted cerumen, left ear: Secondary | ICD-10-CM

## 2019-11-09 DIAGNOSIS — G44201 Tension-type headache, unspecified, intractable: Secondary | ICD-10-CM | POA: Diagnosis not present

## 2019-11-09 NOTE — Telephone Encounter (Signed)
New message   The patient called blood sugar reading this morning 99 patient voiced this was low for her.   The patient is aware the call will be transfer over to [facility] triage nurse for assessment.   Call / spoke with Theresia Lo

## 2019-11-09 NOTE — Progress Notes (Signed)
HPI: Tami Jacobson is a 86 y.o. female who presents is referred by her PCP for evaluation of wax buildup in her ear as well as recent headache.  She had made an appointment originally because of blockage of her left ear.  However over the past 3 weeks she has had a chronic headache and she had a CT scan of her head performed 3 days ago that I reviewed.  This showed clear paranasal sinuses with essentially aplastic and very small frontal sinuses.  There is no clinical evidence on review of the CT scan of sinus infection.  She has been taking doxycycline for questionable sinus infection causing the headaches.  Past Medical History:  Diagnosis Date  . Arthritis    left hip  . Breast cancer, right ([place]) 11/2016  . Family history of breast cancer   . Family history of colon cancer   . Family history of pancreatic cancer   . GERD (gastroesophageal reflux disease)   . Non-insulin dependent type 2 diabetes mellitus ([place])   . PAC (premature atrial contraction)   . Personal history of radiation therapy 12/2016  . Premature ventricular contraction   . Seasonal allergies   . Sensitive skin   . Vitamin B12 deficiency    Past Surgical History:  Procedure Laterality Date  . ABDOMINAL HYSTERECTOMY  1979   partial  . BREAST BIOPSY  07/02/2011   Procedure: BREAST BIOPSY WITH NEEDLE LOCALIZATION;  Surgeon: Shann Medal, MD;  Location: [facility];  Service: General;  Laterality: Left;  left breast atypical hyperplasia needle localization biopsy  . BREAST BIOPSY Left 06/13/2006  . BREAST BIOPSY Right 06/27/2008  . BREAST BIOPSY Right 09/27/2016  . BREAST EXCISIONAL BIOPSY Left 07/02/2011  . BREAST LUMPECTOMY Left 07/04/2006  . BREAST LUMPECTOMY Right 06/26/2008  . BREAST LUMPECTOMY Right 09/27/2016  . BREAST LUMPECTOMY WITH RADIOACTIVE SEED LOCALIZATION Right 11/18/2016   Procedure: RIGHT BREAST LUMPECTOMY WITH RADIOACTIVE SEED LOCALIZATION ERAS PATHWAY;  Surgeon: Alphonsa Overall, MD;  Location: [facility];  Service: General;  Laterality: Right;  ERAS PATHWAY  . CATARACT EXTRACTION Bilateral   . CHOLECYSTECTOMY, LAPAROSCOPIC  07/07/2001   had appendex taking out at the same time  . ESOPHAGEAL DILATION  2006  . LAPAROSCOPIC APPENDECTOMY  07/07/2001  . TONSILLECTOMY  1942   Social History   Socioeconomic History  . Marital status: Divorced    Spouse name: Marcello Moores  . Number of children: 1  . Years of education: Not on file  . Highest education level: Not on file  Occupational History  . Occupation: retire  Tobacco Use  . Smoking status: Never Smoker  . Smokeless tobacco: Never Used  Vaping Use  . Vaping Use: Never used  Substance and Sexual Activity  . Alcohol use: No    Alcohol/week: 0.0 standard drinks  . Drug use: No  . Sexual activity: Not Currently  Other Topics Concern  . Not on file  Social History Narrative  . Not on file   Social Determinants of Health   Financial Resource Strain:   . Difficulty of Paying Living Expenses:   Food Insecurity:   . Worried About Charity fundraiser in the Last Year:   . Arboriculturist in the Last Year:   Transportation Needs:   . Film/video editor (Medical):   Marland Kitchen Lack of Transportation (Non-Medical):   Physical Activity:   . Days of Exercise per Week:   . Minutes of Exercise per Session:  Stress:   . Feeling of Stress :   Social Connections:   . Frequency of Communication with Friends and Family:   . Frequency of Social Gatherings with Friends and Family:   . Attends Religious Services:   . Active Member of Clubs or Organizations:   . Attends Archivist Meetings:   Marland Kitchen Marital Status:    Family History  Problem Relation Age of Onset  . Colon cancer Mother 38  . Heart attack Mother   . Breast cancer Sister 63  . Heart disease Maternal Uncle        CABG  . Breast cancer Sister 31  . Heart disease Brother   . Other Father        suicide  . Breast cancer Sister 62  . Heart disease Sister    . COPD Brother   . Heart disease Brother   . Breast cancer Other 53       niece - brother's daughter  . Pancreatic cancer Other        brother's son, dx in his 48s   Allergies  Allergen Reactions  . Statins Other (See Comments)    GI UPSET  . Effexor [Venlafaxine]     Constipation, shaking, dizzy  . Cephalexin Other (See Comments)    Reaction not recalled by the patient  . Codeine Other (See Comments)    Reaction not recalled by the patient  . Lansoprazole Other (See Comments)    Reaction not recalled by the patient  . Latex Rash       . Levofloxacin Other (See Comments)    Reaction not recalled by the patient  . Nitrofurantoin Other (See Comments)    Reaction not recalled by the patient  . Rofecoxib Other (See Comments)    Reaction not recalled by the patient    Prior to Admission medications   Medication Sig Start Date End Date Taking? Authorizing Provider  ACCU-CHEK GUIDE test strip USE AS DIRECTED 01/22/19  Yes Burns, Claudina Lick, MD  aspirin EC 81 MG tablet Take 81 mg by mouth as needed (for headaches).   Yes [provider]  blood glucose meter kit and supplies KIT Dispense based on patient and insurance preference. Check sugars once daily and as needed as directed. 09/07/16  Yes Burns, Claudina Lick, MD  clobetasol cream (TEMOVATE) 3.14 % Apply 1 application topically as needed (for skin irritation- to affected areas).  06/21/11  Yes [provider]  cyanocobalamin (,VITAMIN B-12,) 1000 MCG/ML injection Inject 1,000 mcg into the muscle every 30 (thirty) days.    Yes [provider]  diphenhydramine-acetaminophen (TYLENOL PM) 25-500 MG TABS tablet Take 1 tablet by mouth at bedtime as needed (for sleep).   Yes [provider]  doxycycline (VIBRA-TABS) 100 MG tablet Take 1 tablet (100 mg total) by mouth 2 (two) times daily. 11/05/19  Yes Burns, Claudina Lick, MD  flecainide (TAMBOCOR) 50 MG tablet Take 1 tablet (50 mg total) by mouth 2 (two) times daily.  10/26/19  Yes Lelon Perla, MD  hydrALAZINE (APRESOLINE) 25 MG tablet Take 1 tablet (25 mg total) by mouth 3 (three) times daily. 10/29/19  Yes Burns, Claudina Lick, MD  losartan (COZAAR) 100 MG tablet TAKE 1 TABLET BY MOUTH  DAILY 10/30/19  Yes Burns, Claudina Lick, MD  metFORMIN (GLUCOPHAGE-XR) 500 MG 24 hr tablet TAKE 1 TABLET BY MOUTH ONCE DAILY WITH BREAKFAST Patient taking differently: Take 500 mg by mouth daily with breakfast.  04/02/19  Yes  Binnie Rail, MD  metoprolol succinate (TOPROL-XL) 50 MG 24 hr tablet Take 1 tablet (50 mg total) by mouth daily. Take with or immediately following a meal. 05/16/19  Yes Amin, Ankit Chirag, MD  naproxen sodium (ALEVE) 220 MG tablet Take 1 tablet (220 mg total) by mouth daily as needed (for pain or headaches). 10/29/19  Yes Burns, Claudina Lick, MD  omeprazole (PRILOSEC) 20 MG capsule Take 1 capsule (20 mg total) by mouth daily. 08/06/19  Yes Esterwood, Amy S, PA-C  prednisoLONE acetate (PRED FORTE) 1 % ophthalmic suspension prednisolone acetate 1 % eye drops,suspension  INSTILL 1 DROP INTO EACH EYE THREE TIMES DAILY FOR 1 WEEK THEN 1 DROP TWICE DAILY FOR SECOND & THIRD WEEK   Yes [provider]  predniSONE (DELTASONE) 10 MG tablet 3 tabs po qd x 3 days, then 2 tabs po qd x 3 days, then 1 tab po qd x 3 days 11/05/19  Yes Burns, Claudina Lick, MD  tamoxifen (NOLVADEX) 20 MG tablet TAKE 1 TABLET BY MOUTH  DAILY Patient taking differently: Take 20 mg by mouth daily.  01/04/19  Yes Magrinat, Virgie Dad, MD     Positive ROS: Otherwise negative  All other systems have been reviewed and were otherwise negative with the exception of those mentioned in the HPI and as above.  Physical Exam: Constitutional: Alert, well-appearing, no acute distress Ears: External ears without lesions or tenderness.  She had a minimal amount of wax on the right side that was cleaned with a curette.  She had a large amount of wax on the left side that was cleaned with forceps and curette.  TMs were  clear bilaterally. Nasal: External nose without lesions. Clear nasal passages bilaterally with clear middle meatus bilaterally and no clinical evidence of active infection. Oral: Lips and gums without lesions. Tongue and palate mucosa without lesions. Posterior oropharynx clear. Neck: No palpable adenopathy or masses Respiratory: Breathing comfortably  Skin: No facial/neck lesions or rash noted.  Cerumen impaction removal  Date/Time: 11/09/2019 5:12 PM Performed by: Rozetta Nunnery, MD Authorized by: Rozetta Nunnery, MD   Consent:    Consent obtained:  Verbal   Consent given by:  Patient   Risks discussed:  Pain and bleeding Procedure details:    Location:  L ear and R ear   Procedure type: curette and forceps   Post-procedure details:    Inspection:  TM intact and canal normal   Hearing quality:  Improved   Patient tolerance of procedure:  Tolerated well, no immediate complications Comments:     TMs are clear bilaterally.    Assessment: Cerumen buildup which is worse on the left side. Chronic headache questionable etiology. No clinical evidence of sinus infection on nasal exam or on review of CT scan  Plan: Ear canals were cleaned in the office. If she continues to have headache problems would recommend follow-up with headache specialist or neurology.   Radene Journey, MD   CC:

## 2019-11-09 NOTE — Telephone Encounter (Signed)
Per [facility] note, Caller states her blood sugar this morning was 99. Caller states she has had a headache for 3 weeks but has seen and treated for the headache. Caller states her CT scan was normal  Was given Home Care instructions of the following:  Care Advice Given Per Guideline * Pre-prandial (before meal): 80-130 mg/dL (4.4-7.2 mmol/L) * Post-prandial (1-2 hours after a meal): Less than 180 mg/dL (10 mmol/L) DAILY BLOOD GLUCOSE GOALS: HOME CARE: * You should be able to treat this at home. LOW BLOOD SUGAR (HYPOGLYCEMIA) - DEFINITION: * Low blood sugar (hypoglycemia) is defined as a blood glucose less than 70 mg/dL (3.9 mmol/L). * Symptoms of mild hypoglycemia: Shakiness, weakness, not thinking clearly, headache, trembling, sweating, dizziness, palpitations, and hunger. LOW BLOOD SUGAR - EXPECTED COURSE: * The symptoms should start getting better in 5-10 minutes. * Milk (1 cup; 240 ml) CALL BACK IF: * You have more questions. * You become worse. CARE ADVICE given per Diabetes - Low Blood Sugar (Adult) guideline

## 2019-11-12 ENCOUNTER — Ambulatory Visit: Payer: Medicare Other | Admitting: Pharmacist

## 2019-11-12 ENCOUNTER — Telehealth: Payer: Self-pay | Admitting: Pharmacist

## 2019-11-12 ENCOUNTER — Other Ambulatory Visit: Payer: Self-pay

## 2019-11-12 DIAGNOSIS — N1832 Chronic kidney disease, stage 3b: Secondary | ICD-10-CM

## 2019-11-12 DIAGNOSIS — I10 Essential (primary) hypertension: Secondary | ICD-10-CM

## 2019-11-12 DIAGNOSIS — I5032 Chronic diastolic (congestive) heart failure: Secondary | ICD-10-CM

## 2019-11-12 DIAGNOSIS — E119 Type 2 diabetes mellitus without complications: Secondary | ICD-10-CM

## 2019-11-12 NOTE — Chronic Care Management (AMB) (Signed)
Chronic Care Management Pharmacy  Name: Tami Jacobson  MRN: [id number] DOB: Mar 03, 1928   Chief Complaint/ HPI  Tami Jacobson,  86 y.o. , female presents for their Initial CCM visit with the clinical pharmacist via telephone due to COVID-19 Pandemic.  PCP : Binnie Rail, MD Patient Care Team: Binnie Rail, MD as PCP - General (Internal Medicine) Stanford Breed Denice Bors, MD as Consulting Physician (Cardiology) Magrinat, Virgie Dad, MD as Consulting Physician (Hematology and Oncology) Newton Pigg, MD as Consulting Physician (Obstetrics and Gynecology) Alphonsa Overall, MD as Consulting Physician (General Surgery) Rozetta Nunnery, MD as Consulting Physician (Otolaryngology) Kyung Rudd, MD as Consulting Physician (Radiation Oncology) Delice Bison, Charlestine Massed, NP as Nurse Practitioner (Hematology and Oncology) Luberta Mutter, MD as Consulting Physician (Ophthalmology) Charlton Haws, [facility] as Pharmacist (Pharmacist)  Their chronic conditions include: Hypertension, Hyperlipidemia, Diabetes, Heart Failure, GERD, Chronic Kidney Disease, Depression and Hx breast cancer   Office Visits: 11/05/19 Dr Quay Burow OV: chronic f/u. Home BP cuff (wrist) does not appear accurate. Headache x 3 wks, treat as sinus infection, ordered CT head (results negative), rx prednisone. Gave B12 shot.  10/29/19 Dr Quay Burow OV: acute visit for high BP at home, blurred vision. Start naproxen PRN, hydralazine 25 mg TID  08/13/19 Dr Quay Burow OV: chronic f/u, move 1 BP med to evening  Consult Visit: 11/09/19 Dr Lucia Gaskins (ENT): cerumen impaction removal 10/22/19 Dr Jana Hakim (heme/onc): breast ca dx 2008, recurrence 2018, no evidence of disease activity to date. Plan to stop tamoxifen in Nov 2023. 08/21/19 Dr Stanford Breed (cardiology): palpitations 2/2 PVCs, continue flecainide and BB.  08/06/19 PA Amy Trellis Paganini (GI): f/u for GERD, hx esophageal stricture requring dilation 2009. Stable, no med changes. 05/14/19-05/16/19  Hospital admission: hypertensive urgency, losartan increased to 100 mg and Toprol continued. Advised 2g salt diet.  Allergies  Allergen Reactions  . Statins Other (See Comments)    GI UPSET  . Effexor [Venlafaxine]     Constipation, shaking, dizzy  . Cephalexin Other (See Comments)    Reaction not recalled by the patient  . Codeine Other (See Comments)    Reaction not recalled by the patient  . Lansoprazole Other (See Comments)    Reaction not recalled by the patient  . Latex Rash       . Levofloxacin Other (See Comments)    Reaction not recalled by the patient  . Nitrofurantoin Other (See Comments)    Reaction not recalled by the patient  . Rofecoxib Other (See Comments)    Reaction not recalled by the patient     Medications: Outpatient Encounter Medications as of 11/12/2019  Medication Sig  . ACCU-CHEK GUIDE test strip USE AS DIRECTED  . aspirin EC 81 MG tablet Take 81 mg by mouth as needed (for headaches).  . blood glucose meter kit and supplies KIT Dispense based on patient and insurance preference. Check sugars once daily and as needed as directed.  . clobetasol cream (TEMOVATE) 6.94 % Apply 1 application topically as needed (for skin irritation- to affected areas).   . cyanocobalamin (,VITAMIN B-12,) 1000 MCG/ML injection Inject 1,000 mcg into the muscle every 30 (thirty) days.   . diphenhydramine-acetaminophen (TYLENOL PM) 25-500 MG TABS tablet Take 1 tablet by mouth at bedtime as needed (for sleep).  Marland Kitchen doxycycline (VIBRA-TABS) 100 MG tablet Take 1 tablet (100 mg total) by mouth 2 (two) times daily.  . flecainide (TAMBOCOR) 50 MG tablet Take 1 tablet (50 mg total) by mouth 2 (two) times daily.  Marland Kitchen  hydrALAZINE (APRESOLINE) 25 MG tablet Take 1 tablet (25 mg total) by mouth 3 (three) times daily.  Marland Kitchen losartan (COZAAR) 100 MG tablet TAKE 1 TABLET BY MOUTH  DAILY  . metFORMIN (GLUCOPHAGE-XR) 500 MG 24 hr tablet TAKE 1 TABLET BY MOUTH ONCE DAILY WITH BREAKFAST (Patient taking  differently: Take 500 mg by mouth daily with breakfast. )  . metoprolol succinate (TOPROL-XL) 50 MG 24 hr tablet Take 1 tablet (50 mg total) by mouth daily. Take with or immediately following a meal.  . omeprazole (PRILOSEC) 20 MG capsule Take 1 capsule (20 mg total) by mouth daily.  . prednisoLONE acetate (PRED FORTE) 1 % ophthalmic suspension prednisolone acetate 1 % eye drops,suspension  INSTILL 1 DROP INTO EACH EYE THREE TIMES DAILY FOR 1 WEEK THEN 1 DROP TWICE DAILY FOR SECOND & THIRD WEEK  . predniSONE (DELTASONE) 10 MG tablet 3 tabs po qd x 3 days, then 2 tabs po qd x 3 days, then 1 tab po qd x 3 days  . tamoxifen (NOLVADEX) 20 MG tablet TAKE 1 TABLET BY MOUTH  DAILY (Patient taking differently: Take 20 mg by mouth daily. )  . naproxen sodium (ALEVE) 220 MG tablet Take 1 tablet (220 mg total) by mouth daily as needed (for pain or headaches). (Patient not taking: Reported on 11/12/2019)   No facility-administered encounter medications on file as of 11/12/2019.     Current Diagnosis/Assessment:  SDOH Interventions     Most Recent Value  SDOH Interventions  Financial Strain Interventions Intervention Not Indicated      Goals Addressed            This Visit's Progress   . Pharmacy Care Plan       CARE PLAN ENTRY (see longitudinal plan of care for additional care plan information)  Current Barriers:  . Chronic Disease Management support, education, and care coordination needs related to Hypertension, Diabetes, Heart Failure, and Chronic Kidney Disease   Hypertension / Heart failure BP Readings from Last 3 Encounters:  11/05/19 (!) 146/82  10/29/19 (!) 164/84  10/22/19 (!) 183/67 .  Pharmacist Clinical Goal(s): o Over the next 30 days, patient will work with PharmD and providers to maintain BP goal < 150/90 . Current regimen:  o Metoprolol succinate 50 mg daily o Losartan 100 mg daily o Hydralazine 25 mg 3 times daily . Interventions: o Discussed BP goals and benefits of  medication for prevention of heart attack / stroke . Patient self care activities - Over the next 30 days, patient will: o Check BP daily, document, and provide at future appointments o Ensure daily salt intake < 2300 mg/day  Diabetes Lab Results  Component Value Date/Time   HGBA1C 6.7 (A) 08/13/2019 02:20 PM   HGBA1C 6.7 (H) 05/15/2019 05:31 AM   HGBA1C 6.8 (H) 02/22/2019 12:04 PM .  Pharmacist Clinical Goal(s): o Over the next 30 days, patient will work with PharmD and providers to maintain A1c goal <7% . Current regimen:  o Metformin 500 mg daily . Interventions: o Discussed blood sugar goals and benefits of medication for prevention of diabetic complications o Discussed prednisone can cause high blood sugar, but sugars should return to normal once steroid course is completed . Patient self care activities - Over the next 30 days, patient will: o Check blood sugar once daily and in the morning before eating or drinking, document, and provide at future appointments o Contact provider with any episodes of hypoglycemia  Chronic kidney disease . Pharmacist Clinical Goal(s)  o Over the next 30 days, patient will work with PharmD and providers to optimize therapy . Current regimen:  o Losartan 100 mg daily . Interventions: o Discussed causes of kidney disease including aging, high blood pressure, and diabetes o Discussed importance of hydration . Patient self care activities - Over the next 30 days, patient will: o Drink at least 48 oz of water per day  Medication management . Pharmacist Clinical Goal(s): o Over the next 30 days, patient will work with PharmD and providers to maintain optimal medication adherence . Current pharmacy: [facility] . Interventions o Comprehensive medication review performed. o Continue current medication management strategy . Patient self care activities - Over the next 30 days, patient will: o Focus on medication adherence by fill  date o Take medications as prescribed o Report any questions or concerns to PharmD and/or provider(s)  Initial goal documentation       Heart Failure   Type: Diastolic  Last ejection fraction: 55-60% (05/11/2019), mild LVH  BP goal is:  <150/90 BP Readings from Last 3 Encounters:  11/05/19 (!) 146/82  10/29/19 (!) 164/84  10/22/19 (!) 183/67   Kidney Function Lab Results  Component Value Date/Time   CREATININE 1.20 (H) 10/22/2019 01:07 PM   CREATININE 0.95 07/20/2019 02:00 PM   CREATININE 1.1 02/28/2017 02:53 PM   CREATININE 0.9 07/13/2013 08:54 AM   GFR 55.29 (L) 07/20/2019 02:00 PM   GFRNONAA 40 (L) 10/22/2019 01:07 PM   GFRAA 46 (L) 10/22/2019 01:07 PM   K 3.6 10/22/2019 01:07 PM   K 4.3 07/20/2019 02:00 PM   K 5.1 02/28/2017 02:53 PM   K 4.9 07/13/2013 08:54 AM   Checking BP: daily Recent BP readings: 156/78 67 177/77 63  Patient has failed these meds in past: n/a Patient is currently uncontrolled on the following medications:  Marland Kitchen Metoprolol succinate 50 mg daily - AM . Losartan 100 mg daily - AM . Hydralazine 25 mg TID . Flecainide 50 mg BID   We discussed: accuracy of wrist BP monitor vs upper arm BP monitor; effect of prednisone on BP, BG; headache as a cause and symptom of high BP; importance of hydration  Plan  Continue current medications and control with diet and exercise  Hyperlipidemia   LDL goal < 100  Lipid Panel     Component Value Date/Time   CHOL 173 02/22/2019 1204   TRIG 157.0 (H) 02/22/2019 1204   HDL 50.40 02/22/2019 1204   LDLCALC 92 02/22/2019 1204   LDLDIRECT 128.3 12/26/2012 1003    Hepatic Function Latest Ref Rng & Units 10/22/2019 07/20/2019 05/15/2019  Total Protein 6.5 - 8.1 g/dL 6.3(L) 6.7 5.8(L)  Albumin 3.5 - 5.0 g/dL 3.4(L) 4.0 3.4(L)  AST 15 - 41 U/L _0 ALT 0 - 44 U/L _1 Alk Phosphatase 38 - 126 U/L 42 40 32(L)  Total Bilirubin 0.3 - 1.2 mg/dL 0.6 0.7 0.5  Bilirubin, Direct 0.0 - 0.2 mg/dL - - -     The ASCVD Risk score Mikey Bussing DC Jr., et al., 2013) failed to calculate for the following reasons:   The 2013 ASCVD risk score is only valid for ages 65 to 23   Patient has failed these meds in past: statin-intolerant per notes Patient is currently controlled on the following medications:  . Aspirin 81 mg daily  We discussed:  Pt has aged out of primary prevention statin benefit group; aspirin benefits may not outweigh risks for primary  prevention, will discuss at f/u  Plan  Continue current medications and control with diet and exercise  Diabetes   A1c goal <7%  Recent Relevant Labs: Lab Results  Component Value Date/Time   HGBA1C 6.7 (A) 08/13/2019 02:20 PM   HGBA1C 6.7 (H) 05/15/2019 05:31 AM   HGBA1C 6.8 (H) 02/22/2019 12:04 PM   GFR 55.29 (L) 07/20/2019 02:00 PM   GFR 46.70 (L) 05/28/2019 12:12 PM   MICROALBUR 1.0 08/23/2018 11:51 AM   MICROALBUR 0.8 09/21/2016 09:46 AM    Last diabetic Eye exam:  Lab Results  Component Value Date/Time   HMDIABEYEEXA No Retinopathy 10/04/2018 12:00 AM    Last diabetic Foot exam: No results found for: HMDIABFOOTEX   Checking BG: Daily  Recent FBG Readings:  99 82 179 201 244  Fish, okra, green beans, unsweet tea Eats a lot of vegetables  Patient has failed these meds in past: n/a Patient is currently controlled on the following medications: Marland Kitchen Metformin ER 500 mg daily - AM  We discussed: Pt is concerned about elevated BG readings over the past week; pt denies changes in diet;  Pt is currently completing a course of prednisone for possible sinus infection, discussed effect of steroids on BG; Assured patient that BG readings should return to normal once prednisone is complete (pt has last dose today).   Plan  Continue current medications and control with diet and exercise  GERD   Patient has failed these meds in past: n/a Patient is currently controlled on the following medications:  . Omeprazole 20 mg daily  We  discussed:  Pt reports she has heartburn if she does not take PPI; discussed long term risks of PPI; pt agreed benefits outweigh risks at this time  Plan  Continue current medications  Hx breast cancer   Patient has failed these meds in past: n/a Patient is currently controlled on the following medications:  . Tamoxifen 20 mg daily  We discussed:  Pt has been on tamoxifen 5 times in her life. Planned end date is Nov 2023  Plan  Continue current medications  Sinus infection (?)   Patient has failed these meds in past: n/a Patient is currently controlled on the following medications:  . Prednisone 20 mg - short course, ending today  We discussed:  Short course of possible sinus infection; steroid is likely affecting BG and BP, both of which should return to baseline once steroid is complete  Plan  Continue current medications  Health Maintenance   Patient is currently controlled on the following medications:  . Clobetasol cream . Vitamin B12 1000 mcg IM q30 days . Naproxen 220 mg PRN headache . Tylenol PM PRN sleep  We discussed:  Patient is satisfied with current regimen and denies issues. Pt also missed labs last week after PCP visit; she would like to set up lab appt for outstanding labs .   Plan  Continue current medications  Scheduled lab appt for 11/16/19 @ 1pm  Medication Management   Pt uses [facility] mail order pharmacy for all medications Uses pill box? Yes Pt endorses 100% compliance  We discussed: Pt reports all medications are free through mail order; she is satisfied with pharmacy services  Plan  Continue current medication management strategy    Follow up: 1 week phone visit  Charlene Brooke, PharmD, BCACP Clinical Pharmacist [facility] [phone]

## 2019-11-12 NOTE — Progress Notes (Signed)
Chronic Care Management Pharmacy Assistant   Name: Donice Alperin  MRN: [id number] DOB: 11/05/1927  Reason for Encounter: Medication Review  Patient Questions:  1.  Have you seen any other providers since your last visit? No  2.  Any changes in your medicines or health? No   Tami Jacobson,  86 y.o. , female presents for their Initial CCM visit with the clinical pharmacist via telephone.  PCP : Binnie Rail, MD  Allergies:   Allergies  Allergen Reactions  . Statins Other (See Comments)    GI UPSET  . Effexor [Venlafaxine]     Constipation, shaking, dizzy  . Cephalexin Other (See Comments)    Reaction not recalled by the patient  . Codeine Other (See Comments)    Reaction not recalled by the patient  . Lansoprazole Other (See Comments)    Reaction not recalled by the patient  . Latex Rash       . Levofloxacin Other (See Comments)    Reaction not recalled by the patient  . Nitrofurantoin Other (See Comments)    Reaction not recalled by the patient  . Rofecoxib Other (See Comments)    Reaction not recalled by the patient     Medications: Outpatient Encounter Medications as of 11/12/2019  Medication Sig  . ACCU-CHEK GUIDE test strip USE AS DIRECTED  . aspirin EC 81 MG tablet Take 81 mg by mouth as needed (for headaches).  . blood glucose meter kit and supplies KIT Dispense based on patient and insurance preference. Check sugars once daily and as needed as directed.  . clobetasol cream (TEMOVATE) 4.94 % Apply 1 application topically as needed (for skin irritation- to affected areas).   . cyanocobalamin (,VITAMIN B-12,) 1000 MCG/ML injection Inject 1,000 mcg into the muscle every 30 (thirty) days.   . diphenhydramine-acetaminophen (TYLENOL PM) 25-500 MG TABS tablet Take 1 tablet by mouth at bedtime as needed (for sleep).  Marland Kitchen doxycycline (VIBRA-TABS) 100 MG tablet Take 1 tablet (100 mg total) by mouth 2 (two) times daily.  . flecainide (TAMBOCOR) 50 MG tablet  Take 1 tablet (50 mg total) by mouth 2 (two) times daily.  . hydrALAZINE (APRESOLINE) 25 MG tablet Take 1 tablet (25 mg total) by mouth 3 (three) times daily.  Marland Kitchen losartan (COZAAR) 100 MG tablet TAKE 1 TABLET BY MOUTH  DAILY  . metFORMIN (GLUCOPHAGE-XR) 500 MG 24 hr tablet TAKE 1 TABLET BY MOUTH ONCE DAILY WITH BREAKFAST (Patient taking differently: Take 500 mg by mouth daily with breakfast. )  . metoprolol succinate (TOPROL-XL) 50 MG 24 hr tablet Take 1 tablet (50 mg total) by mouth daily. Take with or immediately following a meal.  . naproxen sodium (ALEVE) 220 MG tablet Take 1 tablet (220 mg total) by mouth daily as needed (for pain or headaches).  Marland Kitchen omeprazole (PRILOSEC) 20 MG capsule Take 1 capsule (20 mg total) by mouth daily.  . prednisoLONE acetate (PRED FORTE) 1 % ophthalmic suspension prednisolone acetate 1 % eye drops,suspension  INSTILL 1 DROP INTO EACH EYE THREE TIMES DAILY FOR 1 WEEK THEN 1 DROP TWICE DAILY FOR SECOND & THIRD WEEK  . predniSONE (DELTASONE) 10 MG tablet 3 tabs po qd x 3 days, then 2 tabs po qd x 3 days, then 1 tab po qd x 3 days  . tamoxifen (NOLVADEX) 20 MG tablet TAKE 1 TABLET BY MOUTH  DAILY (Patient taking differently: Take 20 mg by mouth daily. )   No facility-administered encounter medications on  file as of 11/12/2019.    Current Diagnosis: Patient Active Problem List   Diagnosis Date Noted  . Sinus infection 11/05/2019  . Headache 10/29/2019  . Palpitations 05/23/2019  . Chronic diastolic CHF (congestive heart failure) ([place]) 05/14/2019  . Hypertensive urgency 05/14/2019  . Mild tricuspid regurgitation 05/13/2019  . LVH (left ventricular hypertrophy), mild 05/13/2019  . Memory difficulty 12/05/2018  . Genetic testing 08/30/2017  . Depression 06/01/2017  . Family history of breast cancer   . Family history of colon cancer   . Family history of pancreatic cancer   . Malignant neoplasm of lower-inner quadrant of right breast of female, estrogen receptor  positive ([place]) 10/28/2016  . Abnormal electrocardiogram 05/05/2015  . BPPV (benign paroxysmal positional vertigo) 10/01/2014  . Murmur 04/18/2014  . Anal fissure 12/16/2011  . Fibrocystic breast changes. Left.  Biopsy 07/02/2011. 07/14/2011  . History of breast cancer, Right, T1c, N0, lumpectomy 06/26/2008.   Left, T1c, N0, lumpectomy 06/24/2006. 05/12/2011  . Diabetes mellitus type 2, controlled ([place]) 06/11/2009  . PREMATURE VENTRICULAR CONTRACTIONS 08/19/2008  . B12 deficiency 08/29/2007  . ESOPHAGEAL STRICTURE 04/27/2007  . GERD (gastroesophageal reflux disease) 04/27/2007  . DIVERTICULOSIS, COLON 04/27/2007  . OTHER ALOPECIA 02/14/2007  . Dyslipidemia 01/02/2007  . Essential hypertension 01/02/2007  . Carotid stenosis 08/22/2006    Goals Addressed   None     Follow-Up:  [facility]ination [facility]   Have you seen any other providers since your last visit? no Any changes in your medications or health? no Any side effects from any medications? no Do you have an symptoms or problems not managed by your medications? no Any concerns about your health right now? no Has your provider asked that you check blood pressure, blood sugar, or follow special diet at home? Yes , 11/11/2019 178-BS, 11/12/2019-201 -BS. The patient also checks her blood pressure 11/11/19- 137/75  Do you get any type of exercise on a regular basis? Yes she walks around the house.  Can you think of a goal you would like to reach for your health? No Do you have any problems getting your medications? no Is there anything that you would like to discuss during the appointment? The patient stated that she is supposed to have some labs done but she doesn't know if she needs to make an appointment or not.   Please bring medications and supplements to appointment

## 2019-11-12 NOTE — Patient Instructions (Addendum)
Visit Information  Phone number for Pharmacist: [phone]  Thank you for meeting with me to discuss your medications! I look forward to working with you to achieve your health care goals. Below is a summary of what we talked about during the visit:  Goals Addressed            This Visit's Progress   . Pharmacy Care Plan       CARE PLAN ENTRY (see longitudinal plan of care for additional care plan information)  Current Barriers:  . Chronic Disease Management support, education, and care coordination needs related to Hypertension, Diabetes, Heart Failure, and Chronic Kidney Disease   Hypertension / Heart failure BP Readings from Last 3 Encounters:  11/05/19 (!) 146/82  10/29/19 (!) 164/84  10/22/19 (!) 183/67 .  Pharmacist Clinical Goal(s): o Over the next 30 days, patient will work with PharmD and providers to maintain BP goal < 150/90 . Current regimen:  o Metoprolol succinate 50 mg daily o Losartan 100 mg daily o Hydralazine 25 mg 3 times daily . Interventions: o Discussed BP goals and benefits of medication for prevention of heart attack / stroke . Patient self care activities - Over the next 30 days, patient will: o Check BP daily, document, and provide at future appointments o Ensure daily salt intake < 2300 mg/day  Diabetes Lab Results  Component Value Date/Time   HGBA1C 6.7 (A) 08/13/2019 02:20 PM   HGBA1C 6.7 (H) 05/15/2019 05:31 AM   HGBA1C 6.8 (H) 02/22/2019 12:04 PM .  Pharmacist Clinical Goal(s): o Over the next 30 days, patient will work with PharmD and providers to maintain A1c goal <7% . Current regimen:  o Metformin 500 mg daily . Interventions: o Discussed blood sugar goals and benefits of medication for prevention of diabetic complications o Discussed prednisone can cause high blood sugar, but sugars should return to normal once steroid course is completed . Patient self care activities - Over the next 30 days, patient will: o Check blood sugar  once daily and in the morning before eating or drinking, document, and provide at future appointments o Contact provider with any episodes of hypoglycemia  Chronic kidney disease . Pharmacist Clinical Goal(s) o Over the next 30 days, patient will work with PharmD and providers to optimize therapy . Current regimen:  o Losartan 100 mg daily . Interventions: o Discussed causes of kidney disease including aging, high blood pressure, and diabetes o Discussed importance of hydration . Patient self care activities - Over the next 30 days, patient will: o Drink at least 48 oz of water per day  Medication management . Pharmacist Clinical Goal(s): o Over the next 30 days, patient will work with PharmD and providers to maintain optimal medication adherence . Current pharmacy: [facility] . Interventions o Comprehensive medication review performed. o Continue current medication management strategy . Patient self care activities - Over the next 30 days, patient will: o Focus on medication adherence by fill date o Take medications as prescribed o Report any questions or concerns to PharmD and/or provider(s)  Initial goal documentation       Ms. Bisesi was given information about Chronic Care Management services today including:  1. CCM service includes personalized support from designated clinical staff supervised by her physician, including individualized plan of care and coordination with other care providers 2. 24/7 contact phone numbers for assistance for urgent and routine care needs. 3. Standard insurance, coinsurance, copays and deductibles apply for chronic care management only during  months in which we provide at least 20 minutes of these services. Most insurances cover these services at 100%, however patients may be responsible for any copay, coinsurance and/or deductible if applicable. This service may help you avoid the need for more expensive face-to-face services. 4. Only  one practitioner may furnish and bill the service in a calendar month. 5. The patient may stop CCM services at any time (effective at the end of the month) by phone call to the office staff.  Patient agreed to services and verbal consent obtained.   Patient verbalizes understanding of instructions provided today.  Telephone follow up appointment with pharmacy team member scheduled for: 1 week  Charlene Brooke, PharmD Clinical Pharmacist [facility] [phone]  How to Take Your Blood Pressure You can take your blood pressure at home with a machine. You may need to check your blood pressure at home:  To check if you have high blood pressure (hypertension).  To check your blood pressure over time.  To make sure your blood pressure medicine is working. Supplies needed: You will need a blood pressure machine, or monitor. You can buy one at a drugstore or online. When choosing one:  Choose one with an arm cuff.  Choose one that wraps around your upper arm. Only one finger should fit between your arm and the cuff.  Do not choose one that measures your blood pressure from your wrist or finger. Your doctor can suggest a monitor. How to prepare Avoid these things for 30 minutes before checking your blood pressure:  Drinking caffeine.  Drinking alcohol.  Eating.  Smoking.  Exercising. Five minutes before checking your blood pressure:  Pee.  Sit in a dining chair. Avoid sitting in a soft couch or armchair.  Be quiet. Do not talk. How to take your blood pressure Follow the instructions that came with your machine. If you have a digital blood pressure monitor, these may be the instructions: 1. Sit up straight. 2. Place your feet on the floor. Do not cross your ankles or legs. 3. Rest your left arm at the level of your heart. You may rest it on a table, desk, or chair. 4. Pull up your shirt sleeve. 5. Wrap the blood pressure cuff around the upper  part of your left arm. The cuff should be 1 inch (2.5 cm) above your elbow. It is best to wrap the cuff around bare skin. 6. Fit the cuff snugly around your arm. You should be able to place only one finger between the cuff and your arm. 7. Put the cord inside the groove of your elbow. 8. Press the power button. 9. Sit quietly while the cuff fills with air and loses air. 10. Write down the numbers on the screen. 11. Wait 2-3 minutes and then repeat steps 1-10. What do the numbers mean? Two numbers make up your blood pressure. The first number is called systolic pressure. The second is called diastolic pressure. An example of a blood pressure reading is "120 over 80" (or 120/80). If you are an adult and do not have a medical condition, use this guide to find out if your blood pressure is normal: Normal  First number: below 120.  Second number: below 80. Elevated  First number: 120-129.  Second number: below 80. Hypertension stage 1  First number: 130-139.  Second number: 80-89. Hypertension stage 2  First number: 140 or above.  Second number: 10 or above. Your blood pressure is above normal  even if only the top or bottom number is above normal. Follow these instructions at home:  Check your blood pressure as often as your doctor tells you to.  Take your monitor to your next doctor's appointment. Your doctor will: ? Make sure you are using it correctly. ? Make sure it is working right.  Make sure you understand what your blood pressure numbers should be.  Tell your doctor if your medicines are causing side effects. Contact a doctor if:  Your blood pressure keeps being high. Get help right away if:  Your first blood pressure number is higher than 180.  Your second blood pressure number is higher than 120. This information is not intended to replace advice given to you by your health care provider. Make sure you discuss any questions you have with your health care  provider. Document Revised: 03/04/2017 Document Reviewed: 08/29/2015 Elsevier Patient Education  2020 [employer].

## 2019-11-16 ENCOUNTER — Other Ambulatory Visit: Payer: Medicare Other

## 2019-11-16 ENCOUNTER — Other Ambulatory Visit: Payer: Self-pay

## 2019-11-16 DIAGNOSIS — I10 Essential (primary) hypertension: Secondary | ICD-10-CM | POA: Diagnosis not present

## 2019-11-16 DIAGNOSIS — N1832 Chronic kidney disease, stage 3b: Secondary | ICD-10-CM | POA: Diagnosis not present

## 2019-11-16 DIAGNOSIS — D631 Anemia in chronic kidney disease: Secondary | ICD-10-CM | POA: Diagnosis not present

## 2019-11-16 LAB — CBC WITH DIFFERENTIAL/PLATELET
Absolute Monocytes: 605 cells/uL (ref 200–950)
Basophils Absolute: 33 cells/uL (ref 0–200)
Basophils Relative: 0.5 %
Eosinophils Absolute: 59 cells/uL (ref 15–500)
Eosinophils Relative: 0.9 %
HCT: 37.3 % (ref 35.0–45.0)
Hemoglobin: 12.2 g/dL (ref 11.7–15.5)
Lymphs Abs: 1391 cells/uL (ref 850–3900)
MCH: 32.1 pg (ref 27.0–33.0)
MCHC: 32.7 g/dL (ref 32.0–36.0)
MCV: 98.2 fL (ref 80.0–100.0)
MPV: 10.8 fL (ref 7.5–12.5)
Monocytes Relative: 9.3 %
Neutro Abs: 4414 cells/uL (ref 1500–7800)
Neutrophils Relative %: 67.9 %
Platelets: 110 10*3/uL — ABNORMAL LOW (ref 140–400)
RBC: 3.8 10*6/uL (ref 3.80–5.10)
RDW: 12.4 % (ref 11.0–15.0)
Total Lymphocyte: 21.4 %
WBC: 6.5 10*3/uL (ref 3.8–10.8)

## 2019-11-16 LAB — BASIC METABOLIC PANEL
BUN/Creatinine Ratio: 13 (calc) (ref 6–22)
BUN: 15 mg/dL (ref 7–25)
CO2: 30 mmol/L (ref 20–32)
Calcium: 9.6 mg/dL (ref 8.6–10.4)
Chloride: 102 mmol/L (ref 98–110)
Creat: 1.2 mg/dL — ABNORMAL HIGH (ref 0.60–0.88)
Glucose, Bld: 177 mg/dL — ABNORMAL HIGH (ref 65–99)
Potassium: 5 mmol/L (ref 3.5–5.3)
Sodium: 138 mmol/L (ref 135–146)

## 2019-11-16 LAB — FERRITIN: Ferritin: 102 ng/mL (ref 16–288)

## 2019-11-16 LAB — IRON: Iron: 84 ug/dL (ref 45–160)

## 2019-11-19 ENCOUNTER — Ambulatory Visit: Payer: Medicare Other | Admitting: Pharmacist

## 2019-11-19 ENCOUNTER — Other Ambulatory Visit: Payer: Self-pay

## 2019-11-19 DIAGNOSIS — I5032 Chronic diastolic (congestive) heart failure: Secondary | ICD-10-CM

## 2019-11-19 DIAGNOSIS — E119 Type 2 diabetes mellitus without complications: Secondary | ICD-10-CM

## 2019-11-19 DIAGNOSIS — I10 Essential (primary) hypertension: Secondary | ICD-10-CM

## 2019-11-19 NOTE — Patient Instructions (Addendum)
Visit Information  Phone number for Pharmacist: [phone]  Goals Addressed            This Visit's Progress   . Pharmacy Care Plan       CARE PLAN ENTRY (see longitudinal plan of care for additional care plan information)  Current Barriers:  . Chronic Disease Management support, education, and care coordination needs related to Hypertension, Diabetes, Heart Failure, and Chronic Kidney Disease   Hypertension / Heart failure BP Readings from Last 3 Encounters:  11/05/19 (!) 146/82  10/29/19 (!) 164/84  10/22/19 (!) 183/67 .  Pharmacist Clinical Goal(s): o Over the next 90 days, patient will work with PharmD and providers to maintain BP goal < 140/90 . Current regimen:  o Metoprolol succinate 50 mg daily o Losartan 100 mg daily o Hydralazine 25 mg 3 times daily . Interventions: o Discussed BP goals and benefits of medication for prevention of heart attack / stroke . Patient self care activities - Over the next 90 days, patient will: o Check BP daily, document, and provide at future appointments o Ensure daily salt intake < 2300 mg/day  Diabetes Lab Results  Component Value Date/Time   HGBA1C 6.7 (A) 08/13/2019 02:20 PM   HGBA1C 6.7 (H) 05/15/2019 05:31 AM   HGBA1C 6.8 (H) 02/22/2019 12:04 PM .  Pharmacist Clinical Goal(s): o Over the next 90 days, patient will work with PharmD and providers to maintain A1c goal <7% . Current regimen:  o Metformin 500 mg daily . Interventions: o Discussed blood sugar goals and benefits of medication for prevention of diabetic complications o Discussed prednisone can cause high blood sugar, but sugars should return to normal once steroid course is completed . Patient self care activities - Over the next 90 days, patient will: o Check blood sugar once daily and in the morning before eating or drinking, document, and provide at future appointments o Contact provider with any episodes of hypoglycemia  Chronic kidney disease . Pharmacist  Clinical Goal(s) o Over the next 90 days, patient will work with PharmD and providers to optimize therapy . Current regimen:  o Losartan 100 mg daily . Interventions: o Discussed causes of kidney disease including aging, high blood pressure, and diabetes o Discussed importance of hydration . Patient self care activities - Over the next 90 days, patient will: o Drink at least 48 oz of water per day  Dry mouth . Pharmacist Clinical Goal(s) o Over the next 90 days, patient will work with PharmD and providers to optimize therapy . Current regimen:  o Tylenol PM at bedtime . Interventions: o Discussed Tylenol PM contains Benadryl (diphenhydramine), which causes dry mouth. Recommend to switch to regular Tylenol to prevent dry mouth . Patient self care activities - Over the next 90 days, patient will: o Stop Tylenol PM and use regular Tylenol instead for aches/pains  Medication management . Pharmacist Clinical Goal(s): o Over the next 90 days, patient will work with PharmD and providers to maintain optimal medication adherence . Current pharmacy: [facility] . Interventions o Comprehensive medication review performed. o Continue current medication management strategy . Patient self care activities - Over the next 90 days, patient will: o Focus on medication adherence by fill date o Take medications as prescribed o Report any questions or concerns to PharmD and/or provider(s)  Initial goal documentation      Patient verbalizes understanding of instructions provided today.   Telephone follow up appointment with pharmacy team member scheduled for: 4 months  Mendel Ryder  Lenord Fellers, PharmD Clinical Pharmacist [facility] After Age 38 After age 71, you are at a higher risk for certain long-term diseases and infections as well as injuries from falls. Falls are a major cause of broken bones and head injuries in people who are  older than age 63. Getting regular preventive care can help to keep you healthy and well. Preventive care includes getting regular testing and making lifestyle changes as recommended by your health care provider. Talk with your health care provider about:  Which screenings and tests you should have. A screening is a test that checks for a disease when you have no symptoms.  A diet and exercise plan that is right for you. What should I know about screenings and tests to prevent falls? Screening and testing are the best ways to find a health problem early. Early diagnosis and treatment give you the best chance of managing medical conditions that are common after age 88. Certain conditions and lifestyle choices may make you more likely to have a fall. Your health care provider may recommend:  Regular vision checks. Poor vision and conditions such as cataracts can make you more likely to have a fall. If you wear glasses, make sure to get your prescription updated if your vision changes.  Medicine review. Work with your health care provider to regularly review all of the medicines you are taking, including over-the-counter medicines. Ask your health care provider about any side effects that may make you more likely to have a fall. Tell your health care provider if any medicines that you take make you feel dizzy or sleepy.  Osteoporosis screening. Osteoporosis is a condition that causes the bones to get weaker. This can make the bones weak and cause them to break more easily.  Blood pressure screening. Blood pressure changes and medicines to control blood pressure can make you feel dizzy.  Strength and balance checks. Your health care provider may recommend certain tests to check your strength and balance while standing, walking, or changing positions.  Foot health exam. Foot pain and numbness, as well as not wearing proper footwear, can make you more likely to have a fall.  Depression screening. You  may be more likely to have a fall if you have a fear of falling, feel emotionally low, or feel unable to do activities that you used to do.  Alcohol use screening. Using too much alcohol can affect your balance and may make you more likely to have a fall. What actions can I take to lower my risk of falls? General instructions  Talk with your health care provider about your risks for falling. Tell your health care provider if: ? You fall. Be sure to tell your health care provider about all falls, even ones that seem minor. ? You feel dizzy, sleepy, or off-balance.  Take over-the-counter and prescription medicines only as told by your health care provider. These include any supplements.  Eat a healthy diet and maintain a healthy weight. A healthy diet includes low-fat dairy products, low-fat (lean) meats, and fiber from whole grains, beans, and lots of fruits and vegetables. Home safety  Remove any tripping hazards, such as rugs, cords, and clutter.  Install safety equipment such as grab bars in bathrooms and safety rails on stairs.  Keep rooms and walkways well-lit. Activity   Follow a regular exercise program to stay fit. This will help you maintain your balance. Ask your health care provider what types  of exercise are appropriate for you.  If you need a cane or walker, use it as recommended by your health care provider.  Wear supportive shoes that have nonskid soles. Lifestyle  Do not drink alcohol if your health care provider tells you not to drink.  If you drink alcohol, limit how much you have: ? 0-1 drink a day for women. ? 0-2 drinks a day for men.  Be aware of how much alcohol is in your drink. In the [place] one drink equals one typical bottle of beer (12 oz), one-half glass of wine (5 oz), or one shot of hard liquor (1 oz).  Do not use any products that contain nicotine or tobacco, such as cigarettes and e-cigarettes. If you need help quitting, ask your health care  provider. Summary  Having a healthy lifestyle and getting preventive care can help to protect your health and wellness after age 33.  Screening and testing are the best way to find a health problem early and help you avoid having a fall. Early diagnosis and treatment give you the best chance for managing medical conditions that are more common for people who are older than age 56.  Falls are a major cause of broken bones and head injuries in people who are older than age 66. Take precautions to prevent a fall at home.  Work with your health care provider to learn what changes you can make to improve your health and wellness and to prevent falls. This information is not intended to replace advice given to you by your health care provider. Make sure you discuss any questions you have with your health care provider. Document Revised: 07/13/2018 Document Reviewed: 02/02/2017 Elsevier Patient Education  2020 [employer].

## 2019-11-19 NOTE — Chronic Care Management (AMB) (Signed)
Chronic Care Management Pharmacy  Name: Nylia Gavina  MRN: [id number] DOB: 1927/04/16   Chief Complaint/ HPI  Tami Jacobson,  86 y.o. , female presents for their Follow-Up CCM visit with the clinical pharmacist via telephone due to COVID-19 Pandemic.  PCP : Binnie Rail, MD Patient Care Team: Binnie Rail, MD as PCP - General (Internal Medicine) Stanford Breed Denice Bors, MD as Consulting Physician (Cardiology) Magrinat, Virgie Dad, MD as Consulting Physician (Hematology and Oncology) Newton Pigg, MD as Consulting Physician (Obstetrics and Gynecology) Alphonsa Overall, MD as Consulting Physician (General Surgery) Rozetta Nunnery, MD as Consulting Physician (Otolaryngology) Kyung Rudd, MD as Consulting Physician (Radiation Oncology) Delice Bison, Charlestine Massed, NP as Nurse Practitioner (Hematology and Oncology) Luberta Mutter, MD as Consulting Physician (Ophthalmology) Charlton Haws, [facility] as Pharmacist (Pharmacist)  Their chronic conditions include: Hypertension, Hyperlipidemia, Diabetes, Heart Failure, GERD, Chronic Kidney Disease, Depression and Hx breast cancer   Office Visits: 11/05/19 Dr Quay Burow OV: chronic f/u. Home BP cuff (wrist) does not appear accurate. Headache x 3 wks, treat as sinus infection, ordered CT head (results negative), rx prednisone. Gave B12 shot.  10/29/19 Dr Quay Burow OV: acute visit for high BP at home, blurred vision. Start naproxen PRN, hydralazine 25 mg TID  08/13/19 Dr Quay Burow OV: chronic f/u, move 1 BP med to evening  Consult Visit: 11/09/19 Dr Lucia Gaskins (ENT): cerumen impaction removal 10/22/19 Dr Jana Hakim (heme/onc): breast ca dx 2008, recurrence 2018, no evidence of disease activity to date. Plan to stop tamoxifen in Nov 2023. 08/21/19 Dr Stanford Breed (cardiology): palpitations 2/2 PVCs, continue flecainide and BB.  08/06/19 PA Amy Trellis Paganini (GI): f/u for GERD, hx esophageal stricture requring dilation 2009. Stable, no med changes. 05/14/19-05/16/19  Hospital admission: hypertensive urgency, losartan increased to 100 mg and Toprol continued. Advised 2g salt diet.  Allergies  Allergen Reactions  . Statins Other (See Comments)    GI UPSET  . Effexor [Venlafaxine]     Constipation, shaking, dizzy  . Cephalexin Other (See Comments)    Reaction not recalled by the patient  . Codeine Other (See Comments)    Reaction not recalled by the patient  . Lansoprazole Other (See Comments)    Reaction not recalled by the patient  . Latex Rash       . Levofloxacin Other (See Comments)    Reaction not recalled by the patient  . Nitrofurantoin Other (See Comments)    Reaction not recalled by the patient  . Rofecoxib Other (See Comments)    Reaction not recalled by the patient     Medications: Outpatient Encounter Medications as of 11/19/2019  Medication Sig  . ACCU-CHEK GUIDE test strip USE AS DIRECTED  . aspirin EC 81 MG tablet Take 81 mg by mouth as needed (for headaches).  . blood glucose meter kit and supplies KIT Dispense based on patient and insurance preference. Check sugars once daily and as needed as directed.  . clobetasol cream (TEMOVATE) 2.22 % Apply 1 application topically as needed (for skin irritation- to affected areas).   . cyanocobalamin (,VITAMIN B-12,) 1000 MCG/ML injection Inject 1,000 mcg into the muscle every 30 (thirty) days.   . diphenhydramine-acetaminophen (TYLENOL PM) 25-500 MG TABS tablet Take 1 tablet by mouth at bedtime as needed (for sleep).  Marland Kitchen doxycycline (VIBRA-TABS) 100 MG tablet Take 1 tablet (100 mg total) by mouth 2 (two) times daily.  . flecainide (TAMBOCOR) 50 MG tablet Take 1 tablet (50 mg total) by mouth 2 (two) times daily.  Marland Kitchen  hydrALAZINE (APRESOLINE) 25 MG tablet Take 1 tablet (25 mg total) by mouth 3 (three) times daily.  Marland Kitchen losartan (COZAAR) 100 MG tablet TAKE 1 TABLET BY MOUTH  DAILY  . metFORMIN (GLUCOPHAGE-XR) 500 MG 24 hr tablet TAKE 1 TABLET BY MOUTH ONCE DAILY WITH BREAKFAST (Patient taking  differently: Take 500 mg by mouth daily with breakfast. )  . metoprolol succinate (TOPROL-XL) 50 MG 24 hr tablet Take 1 tablet (50 mg total) by mouth daily. Take with or immediately following a meal.  . naproxen sodium (ALEVE) 220 MG tablet Take 1 tablet (220 mg total) by mouth daily as needed (for pain or headaches). (Patient not taking: Reported on 11/12/2019)  . omeprazole (PRILOSEC) 20 MG capsule Take 1 capsule (20 mg total) by mouth daily.  . prednisoLONE acetate (PRED FORTE) 1 % ophthalmic suspension prednisolone acetate 1 % eye drops,suspension  INSTILL 1 DROP INTO EACH EYE THREE TIMES DAILY FOR 1 WEEK THEN 1 DROP TWICE DAILY FOR SECOND & THIRD WEEK  . predniSONE (DELTASONE) 10 MG tablet 3 tabs po qd x 3 days, then 2 tabs po qd x 3 days, then 1 tab po qd x 3 days  . tamoxifen (NOLVADEX) 20 MG tablet TAKE 1 TABLET BY MOUTH  DAILY (Patient taking differently: Take 20 mg by mouth daily. )   No facility-administered encounter medications on file as of 11/19/2019.     Current Diagnosis/Assessment:    Goals Addressed            This Visit's Progress   . Pharmacy Care Plan       CARE PLAN ENTRY (see longitudinal plan of care for additional care plan information)  Current Barriers:  . Chronic Disease Management support, education, and care coordination needs related to Hypertension, Diabetes, Heart Failure, and Chronic Kidney Disease   Hypertension / Heart failure BP Readings from Last 3 Encounters:  11/05/19 (!) 146/82  10/29/19 (!) 164/84  10/22/19 (!) 183/67 .  Pharmacist Clinical Goal(s): o Over the next 120 days, patient will work with PharmD and providers to maintain BP goal < 140/90 . Current regimen:  o Metoprolol succinate 50 mg daily o Losartan 100 mg daily o Hydralazine 25 mg 3 times daily . Interventions: o Discussed BP goals and benefits of medication for prevention of heart attack / stroke . Patient self care activities - Over the next 120 days, patient  will: o Check BP daily, document, and provide at future appointments o Ensure daily salt intake < 2300 mg/day  Diabetes Lab Results  Component Value Date/Time   HGBA1C 6.7 (A) 08/13/2019 02:20 PM   HGBA1C 6.7 (H) 05/15/2019 05:31 AM   HGBA1C 6.8 (H) 02/22/2019 12:04 PM .  Pharmacist Clinical Goal(s): o Over the next 120 days, patient will work with PharmD and providers to maintain A1c goal <7% . Current regimen:  o Metformin 500 mg daily . Interventions: o Discussed blood sugar goals and benefits of medication for prevention of diabetic complications o Discussed prednisone can cause high blood sugar, but sugars should return to normal once steroid course is completed . Patient self care activities - Over the next 120 days, patient will: o Check blood sugar once daily and in the morning before eating or drinking, document, and provide at future appointments o Contact provider with any episodes of hypoglycemia  Chronic kidney disease . Pharmacist Clinical Goal(s) o Over the next 120 days, patient will work with PharmD and providers to optimize therapy . Current regimen:  o Losartan  100 mg daily . Interventions: o Discussed causes of kidney disease including aging, high blood pressure, and diabetes o Discussed importance of hydration . Patient self care activities - Over the next 120 days, patient will: o Drink at least 48 oz of water per day  Dry mouth . Pharmacist Clinical Goal(s) o Over the next 120 days, patient will work with PharmD and providers to optimize therapy . Current regimen:  o Tylenol PM at bedtime . Interventions: o Discussed Tylenol PM contains Benadryl (diphenhydramine), which causes dry mouth. Recommend to switch to regular Tylenol to prevent dry mouth . Patient self care activities - Over the next 120 days, patient will: o Stop Tylenol PM and use regular Tylenol instead for aches/pains  Medication management . Pharmacist Clinical Goal(s): o Over the next  120 days, patient will work with PharmD and providers to maintain optimal medication adherence . Current pharmacy: [facility] . Interventions o Comprehensive medication review performed. o Continue current medication management strategy . Patient self care activities - Over the next 120 days, patient will: o Focus on medication adherence by fill date o Take medications as prescribed o Report any questions or concerns to PharmD and/or provider(s)  Please see past updates related to this goal by clicking on the "Past Updates" button in the selected goal        Heart Failure / Hypertension   Type: Diastolic Last ejection fraction 05/11/2019: 55-60%, mild LVH  BP goal is:  <150/90 BP Readings from Last 3 Encounters:  11/05/19 (!) 146/82  10/29/19 (!) 164/84  10/22/19 (!) 183/67   Kidney Function Lab Results  Component Value Date/Time   CREATININE 1.20 (H) 11/16/2019 01:02 PM   CREATININE 1.20 (H) 10/22/2019 01:07 PM   CREATININE 0.95 07/20/2019 02:00 PM   CREATININE 1.1 02/28/2017 02:53 PM   CREATININE 0.9 07/13/2013 08:54 AM   GFR 55.29 (L) 07/20/2019 02:00 PM   GFRNONAA 40 (L) 10/22/2019 01:07 PM   GFRAA 46 (L) 10/22/2019 01:07 PM   K 5.0 11/16/2019 01:02 PM   K 3.6 10/22/2019 01:07 PM   K 5.1 02/28/2017 02:53 PM   K 4.9 07/13/2013 08:54 AM   Checking BP: daily Recent BP readings: SBP 110-120s/60s  Patient has failed these meds in past: n/a Patient is currently uncontrolled on the following medications:  Marland Kitchen Metoprolol succinate 50 mg daily - AM . Losartan 100 mg daily - AM . Hydralazine 25 mg TID . Flecainide 50 mg BID   We discussed: BP has improved significantly since stopping prednisone a week ago and headaches have improved.   Plan  Continue current medications and control with diet and exercise  Hyperlipidemia   LDL goal < 100  Lipid Panel     Component Value Date/Time   CHOL 173 02/22/2019 1204   TRIG 157.0 (H) 02/22/2019 1204   HDL 50.40  02/22/2019 1204   LDLCALC 92 02/22/2019 1204   LDLDIRECT 128.3 12/26/2012 1003    Hepatic Function Latest Ref Rng & Units 10/22/2019 07/20/2019 05/15/2019  Total Protein 6.5 - 8.1 g/dL 6.3(L) 6.7 5.8(L)  Albumin 3.5 - 5.0 g/dL 3.4(L) 4.0 3.4(L)  AST 15 - 41 U/L _0 ALT 0 - 44 U/L _1 Alk Phosphatase 38 - 126 U/L 42 40 32(L)  Total Bilirubin 0.3 - 1.2 mg/dL 0.6 0.7 0.5  Bilirubin, Direct 0.0 - 0.2 mg/dL - - -    The ASCVD Risk score Mikey Bussing DC Jr., et al., 2013) failed to  calculate for the following reasons:   The 2013 ASCVD risk score is only valid for ages 23 to 43   Patient has failed these meds in past: statin-intolerant per notes; aspirin Patient is currently controlled on the following medications:  . No medications  We discussed:  Pt has aged out of primary prevention statin benefit group; she has not taken aspirin for years  Plan  Continue current medications and control with diet and exercise  Diabetes   A1c goal <7%  Recent Relevant Labs: Lab Results  Component Value Date/Time   HGBA1C 6.7 (A) 08/13/2019 02:20 PM   HGBA1C 6.7 (H) 05/15/2019 05:31 AM   HGBA1C 6.8 (H) 02/22/2019 12:04 PM   GFR 55.29 (L) 07/20/2019 02:00 PM   GFR 46.70 (L) 05/28/2019 12:12 PM   MICROALBUR 1.0 08/23/2018 11:51 AM   MICROALBUR 0.8 09/21/2016 09:46 AM    Last diabetic Eye exam:  Lab Results  Component Value Date/Time   HMDIABEYEEXA No Retinopathy 10/04/2018 12:00 AM    Last diabetic Foot exam: No results found for: HMDIABFOOTEX   Checking BG: Daily Recent FBG Readings: 144  Fish, okra, green beans, unsweet tea Eats a lot of vegetables  Patient has failed these meds in past: n/a Patient is currently controlled on the following medications: Marland Kitchen Metformin ER 500 mg daily - AM  We discussed: Recently BG was elevated due to prednisone course, pt completed prednisone 1 week ago and BG slowly coming down to normal.  Plan  Continue current medications and control with  diet and exercise  Health Maintenance   Patient is currently controlled on the following medications:  . Clobetasol cream . Vitamin B12 1000 mcg IM q30 days . Naproxen 220 mg PRN headache . Tylenol PM PRN   We discussed:  Pt complaining of dry mouth, discussed Tylenol PM (diphenhydramine) can cause dry mouth.   Plan  Recommend to use regular Tylenol instead of PM due to dry mouth  Medication Management   Pt uses [facility] mail order pharmacy for all medications Uses pill box? Yes Pt endorses 100% compliance  We discussed: Pt reports all medications are free through mail order; she is satisfied with pharmacy services  Plan  Continue current medication management strategy    Follow up: 4 month phone visit  Charlene Brooke, PharmD, BCACP Clinical Pharmacist [facility] [phone]

## 2019-11-21 DIAGNOSIS — H52203 Unspecified astigmatism, bilateral: Secondary | ICD-10-CM | POA: Diagnosis not present

## 2019-11-21 DIAGNOSIS — H353131 Nonexudative age-related macular degeneration, bilateral, early dry stage: Secondary | ICD-10-CM | POA: Diagnosis not present

## 2019-11-21 DIAGNOSIS — E119 Type 2 diabetes mellitus without complications: Secondary | ICD-10-CM | POA: Diagnosis not present

## 2019-11-21 DIAGNOSIS — H01002 Unspecified blepharitis right lower eyelid: Secondary | ICD-10-CM | POA: Diagnosis not present

## 2019-11-26 NOTE — Progress Notes (Addendum)
Subjective:    Patient ID: Tami Jacobson, female    DOB: 07-22-1927, 86 y.o.   MRN: [id number]  HPI The patient is here for an acute visit.   Headache:  She still has a headache. Her headache started about a month ago. Her headaches are better. She states everyday her head hurts a little - just enough to feel bad. She can always feel the headache. She states the pain is all over her head. She is unsure if it is in one area more than the other. If she pushes on the eyebrows it helps a little. She is not currently taking anything for her headaches. She states sometimes the headaches are worse with eating.  She is also drooling from the right side.  This started a month or so ago.  She does not know it is happening until it is running down her chin.  She denies difficulty eating/drinking.     Stomach issues:  Her stomach feels strange after she eats.  She ate two hours ago (oatmeal) and it is still not settled. She does feel like there is some nausea. She denies any stomach pain. She takes omeprazole daily.  She has GERD only once in a while. She has taken Tums and that has helped. She denies any particular food that that made her symptoms worse   Her lower neck hurts.  She is tired all the time and has shakes.    BP at home 100/56 - 122/60. She feels the same no matter if her blood pressure is on the low side or not.   No exercise. She wonders about transportation to her appointments if she cannot drive.  She is not a great historian.    Medications and allergies reviewed with patient and updated if appropriate.  Patient Active Problem List   Diagnosis Date Noted  . Sinus infection 11/05/2019  . Headache 10/29/2019  . Palpitations 05/23/2019  . Chronic diastolic CHF (congestive heart failure) ([place]) 05/14/2019  . Hypertensive urgency 05/14/2019  . Mild tricuspid regurgitation 05/13/2019  . LVH (left ventricular hypertrophy), mild 05/13/2019  . Memory difficulty  12/05/2018  . Genetic testing 08/30/2017  . Depression 06/01/2017  . Family history of breast cancer   . Family history of colon cancer   . Family history of pancreatic cancer   . Malignant neoplasm of lower-inner quadrant of right breast of female, estrogen receptor positive ([place]) 10/28/2016  . Abnormal electrocardiogram 05/05/2015  . BPPV (benign paroxysmal positional vertigo) 10/01/2014  . Murmur 04/18/2014  . Anal fissure 12/16/2011  . Fibrocystic breast changes. Left.  Biopsy 07/02/2011. 07/14/2011  . History of breast cancer, Right, T1c, N0, lumpectomy 06/26/2008.   Left, T1c, N0, lumpectomy 06/24/2006. 05/12/2011  . Diabetes mellitus type 2, controlled ([place]) 06/11/2009  . PREMATURE VENTRICULAR CONTRACTIONS 08/19/2008  . B12 deficiency 08/29/2007  . ESOPHAGEAL STRICTURE 04/27/2007  . GERD (gastroesophageal reflux disease) 04/27/2007  . DIVERTICULOSIS, COLON 04/27/2007  . OTHER ALOPECIA 02/14/2007  . Dyslipidemia 01/02/2007  . Essential hypertension 01/02/2007  . Carotid stenosis 08/22/2006    Current Outpatient Medications on File Prior to Visit  Medication Sig Dispense Refill  . ACCU-CHEK GUIDE test strip USE AS DIRECTED 100 strip 3  . aspirin EC 81 MG tablet Take 81 mg by mouth as needed (for headaches).    . blood glucose meter kit and supplies KIT Dispense based on patient and insurance preference. Check sugars once daily and as needed as directed. 1 each 0  .  clobetasol cream (TEMOVATE) 7.67 % Apply 1 application topically as needed (for skin irritation- to affected areas).     . cyanocobalamin (,VITAMIN B-12,) 1000 MCG/ML injection Inject 1,000 mcg into the muscle every 30 (thirty) days.     . diphenhydramine-acetaminophen (TYLENOL PM) 25-500 MG TABS tablet Take 1 tablet by mouth at bedtime as needed (for sleep).    . flecainide (TAMBOCOR) 50 MG tablet Take 1 tablet (50 mg total) by mouth 2 (two) times daily. 180 tablet 3  . hydrALAZINE (APRESOLINE) 25 MG tablet Take 1  tablet (25 mg total) by mouth 3 (three) times daily. 90 tablet 5  . losartan (COZAAR) 100 MG tablet TAKE 1 TABLET BY MOUTH  DAILY 90 tablet 3  . metFORMIN (GLUCOPHAGE-XR) 500 MG 24 hr tablet TAKE 1 TABLET BY MOUTH ONCE DAILY WITH BREAKFAST (Patient taking differently: Take 500 mg by mouth daily with breakfast. ) 90 tablet 3  . metoprolol succinate (TOPROL-XL) 50 MG 24 hr tablet Take 1 tablet (50 mg total) by mouth daily. Take with or immediately following a meal. 30 tablet 0  . omeprazole (PRILOSEC) 20 MG capsule Take 1 capsule (20 mg total) by mouth daily. 90 capsule 3  . prednisoLONE acetate (PRED FORTE) 1 % ophthalmic suspension prednisolone acetate 1 % eye drops,suspension  INSTILL 1 DROP INTO EACH EYE THREE TIMES DAILY FOR 1 WEEK THEN 1 DROP TWICE DAILY FOR SECOND & THIRD WEEK    . tamoxifen (NOLVADEX) 20 MG tablet TAKE 1 TABLET BY MOUTH  DAILY (Patient taking differently: Take 20 mg by mouth daily. ) 90 tablet 3   No current facility-administered medications on file prior to visit.    Past Medical History:  Diagnosis Date  . Arthritis    left hip  . Breast cancer, right ([place]) 11/2016  . Family history of breast cancer   . Family history of colon cancer   . Family history of pancreatic cancer   . GERD (gastroesophageal reflux disease)   . Non-insulin dependent type 2 diabetes mellitus ([place])   . PAC (premature atrial contraction)   . Personal history of radiation therapy 12/2016  . Premature ventricular contraction   . Seasonal allergies   . Sensitive skin   . Vitamin B12 deficiency     Past Surgical History:  Procedure Laterality Date  . ABDOMINAL HYSTERECTOMY  1979   partial  . BREAST BIOPSY  07/02/2011   Procedure: BREAST BIOPSY WITH NEEDLE LOCALIZATION;  Surgeon: Shann Medal, MD;  Location: [place];  Service: General;  Laterality: Left;  left breast atypical hyperplasia needle localization biopsy  . BREAST BIOPSY Left 06/13/2006  . BREAST BIOPSY Right 06/27/2008  .  BREAST BIOPSY Right 09/27/2016  . BREAST EXCISIONAL BIOPSY Left 07/02/2011  . BREAST LUMPECTOMY Left 07/04/2006  . BREAST LUMPECTOMY Right 06/26/2008  . BREAST LUMPECTOMY Right 09/27/2016  . BREAST LUMPECTOMY WITH RADIOACTIVE SEED LOCALIZATION Right 11/18/2016   Procedure: RIGHT BREAST LUMPECTOMY WITH RADIOACTIVE SEED LOCALIZATION ERAS PATHWAY;  Surgeon: Alphonsa Overall, MD;  Location: [place];  Service: General;  Laterality: Right;  ERAS PATHWAY  . CATARACT EXTRACTION Bilateral   . CHOLECYSTECTOMY, LAPAROSCOPIC  07/07/2001   had appendex taking out at the same time  . ESOPHAGEAL DILATION  2006  . LAPAROSCOPIC APPENDECTOMY  07/07/2001  . TONSILLECTOMY  1942    Social History   Socioeconomic History  . Marital status: Divorced    Spouse name: Marcello Moores  . Number of children: 1  . Years of education:  Not on file  . Highest education level: Not on file  Occupational History  . Occupation: retire  Tobacco Use  . Smoking status: Never Smoker  . Smokeless tobacco: Never Used  Vaping Use  . Vaping Use: Never used  Substance and Sexual Activity  . Alcohol use: No    Alcohol/week: 0.0 standard drinks  . Drug use: No  . Sexual activity: Not Currently  Other Topics Concern  . Not on file  Social History Narrative  . Not on file   Social Determinants of Health   Financial Resource Strain: Low Risk   . Difficulty of Paying Living Expenses: Not hard at all  Food Insecurity:   . Worried About Charity fundraiser in the Last Year: Not on file  . Ran Out of Food in the Last Year: Not on file  Transportation Needs:   . Lack of Transportation (Medical): Not on file  . Lack of Transportation (Non-Medical): Not on file  Physical Activity:   . Days of Exercise per Week: Not on file  . Minutes of Exercise per Session: Not on file  Stress:   . Feeling of Stress : Not on file  Social Connections:   . Frequency of Communication with Friends and Family: Not on file  .  Frequency of Social Gatherings with Friends and Family: Not on file  . Attends Religious Services: Not on file  . Active Member of Clubs or Organizations: Not on file  . Attends Archivist Meetings: Not on file  . Marital Status: Not on file    Family History  Problem Relation Age of Onset  . Colon cancer Mother 69  . Heart attack Mother   . Breast cancer Sister 9  . Heart disease Maternal Uncle        CABG  . Breast cancer Sister 53  . Heart disease Brother   . Other Father        suicide  . Breast cancer Sister 68  . Heart disease Sister   . COPD Brother   . Heart disease Brother   . Breast cancer Other 93       niece - brother's daughter  . Pancreatic cancer Other        brother's son, dx in his 81s    Review of Systems  Constitutional: Positive for fatigue. Negative for fever.  Respiratory: Negative for cough, shortness of breath and wheezing.   Cardiovascular: Negative for chest pain, palpitations and leg swelling.  Gastrointestinal: Positive for nausea. Negative for abdominal pain, blood in stool, constipation and diarrhea.       Occ gerd - controlled  Neurological: Positive for dizziness (with quick movements) and headaches. Negative for light-headedness.       Objective:   Vitals:   11/27/19 0951  BP: 126/78  Pulse: 69  Temp: 98.1 F (36.7 C)  SpO2: 96%   BP Readings from Last 3 Encounters:  11/27/19 126/78  11/05/19 (!) 146/82  10/29/19 (!) 164/84   Wt Readings from Last 3 Encounters:  11/27/19 126 lb (57.2 kg)  11/05/19 127 lb (57.6 kg)  10/29/19 128 lb (58.1 kg)   Body mass index is 23.05 kg/m.   Physical Exam    Constitutional: Appears well-developed and well-nourished. No distress.  Head: Normocephalic and atraumatic.  Neck: Neck supple. No tracheal deviation present. No thyromegaly present.  No cervical lymphadenopathy Cardiovascular: Normal rate, regular rhythm and normal heart sounds. 2/6 systolic murmur heard. No carotid  bruit .  No edema Pulmonary/Chest: Effort normal and breath sounds normal. No respiratory distress. No has no wheezes. No rales. Abdomen: Soft, nontender, nondistended Skin: Skin is warm and dry. Not diaphoretic.  Psychiatric: Normal mood and affect. Behavior is normal.       Assessment & Plan:    See Problem List for Assessment and Plan of chronic medical problems.    This visit occurred during the SARS-CoV-2 public health emergency.  Safety protocols were in place, including screening questions prior to the visit, additional usage of staff PPE, and extensive cleaning of exam room while observing appropriate contact time as indicated for disinfecting solutions.

## 2019-11-27 ENCOUNTER — Ambulatory Visit (INDEPENDENT_AMBULATORY_CARE_PROVIDER_SITE_OTHER): Payer: Medicare Other | Admitting: Internal Medicine

## 2019-11-27 ENCOUNTER — Encounter: Payer: Self-pay | Admitting: Internal Medicine

## 2019-11-27 ENCOUNTER — Other Ambulatory Visit: Payer: Self-pay

## 2019-11-27 VITALS — BP 126/78 | HR 69 | Temp 98.1°F | Ht 62.0 in | Wt 126.0 lb

## 2019-11-27 DIAGNOSIS — R413 Other amnesia: Secondary | ICD-10-CM | POA: Diagnosis not present

## 2019-11-27 DIAGNOSIS — K219 Gastro-esophageal reflux disease without esophagitis: Secondary | ICD-10-CM | POA: Diagnosis not present

## 2019-11-27 DIAGNOSIS — R519 Headache, unspecified: Secondary | ICD-10-CM

## 2019-11-27 DIAGNOSIS — I10 Essential (primary) hypertension: Secondary | ICD-10-CM | POA: Diagnosis not present

## 2019-11-27 MED ORDER — OMEPRAZOLE 40 MG PO CPDR
40.0000 mg | DELAYED_RELEASE_CAPSULE | Freq: Every day | ORAL | 5 refills | Status: DC
Start: 2019-11-27 — End: 2019-11-29

## 2019-11-27 NOTE — Assessment & Plan Note (Addendum)
Subacute Headache started just over a month ago They have improved, but are still there She has a constant headache but it varies in intensity CT of the head recently without acute findings Currently not taking anything No change in headaches with better control of blood pressure and treatment of questionable sinus infection She does have some neck pain-? Cervicogenic in nature-advised trying Tylenol twice daily to see if that helps Will refer to neurology for further evaluation

## 2019-11-27 NOTE — Patient Instructions (Addendum)
Call GI and schedule an appointment for your stomach upset.         Medications reviewed and updated.  Changes include :  Increase omeprazole to 40 mg daily.  Start Tylenol 2 extra strength twice a day.     Your prescription(s) have been submitted to your pharmacy. Please take as directed and contact our office if you believe you are having problem(s) with the medication(s).  A referral was ordered for neurology.      Someone from their office will call you to schedule an appointment.    Please followup in November as scheduled.

## 2019-11-27 NOTE — Assessment & Plan Note (Signed)
Acute on chronic Unsettled stomach or nausea possibly related to atypical GERD We will try increasing omeprazole to 40 mg daily Advised that she follow-up with GI

## 2019-11-27 NOTE — Assessment & Plan Note (Signed)
Chronic Did discuss that since she is seeing neurology it may be a good idea to have her memory evaluated and she agrees

## 2019-11-27 NOTE — Assessment & Plan Note (Signed)
Chronic Blood pressure was uncontrolled, but is well controlled right now. Numbers at home are slightly on the low side, but I worry if her cuff is accurate Her headache symptoms started when her blood pressure was elevated-no improvement after getting better controlled May need to back off of some of her medications, but will monitor for now since I am not sure her blood pressure is contributing to her current symptoms.

## 2019-11-28 DIAGNOSIS — L900 Lichen sclerosus et atrophicus: Secondary | ICD-10-CM | POA: Diagnosis not present

## 2019-11-29 ENCOUNTER — Telehealth: Payer: Self-pay | Admitting: Internal Medicine

## 2019-11-29 ENCOUNTER — Encounter: Payer: Self-pay | Admitting: Physician Assistant

## 2019-11-29 ENCOUNTER — Ambulatory Visit: Payer: Medicare Other | Admitting: Physician Assistant

## 2019-11-29 ENCOUNTER — Other Ambulatory Visit (INDEPENDENT_AMBULATORY_CARE_PROVIDER_SITE_OTHER): Payer: Medicare Other

## 2019-11-29 VITALS — BP 128/74 | HR 64 | Ht 62.5 in | Wt 128.0 lb

## 2019-11-29 DIAGNOSIS — R519 Headache, unspecified: Secondary | ICD-10-CM

## 2019-11-29 DIAGNOSIS — R1013 Epigastric pain: Secondary | ICD-10-CM

## 2019-11-29 DIAGNOSIS — G4489 Other headache syndrome: Secondary | ICD-10-CM

## 2019-11-29 DIAGNOSIS — K219 Gastro-esophageal reflux disease without esophagitis: Secondary | ICD-10-CM | POA: Diagnosis not present

## 2019-11-29 DIAGNOSIS — M542 Cervicalgia: Secondary | ICD-10-CM

## 2019-11-29 LAB — SEDIMENTATION RATE: Sed Rate: 1 mm/hr (ref 0–30)

## 2019-11-29 LAB — C-REACTIVE PROTEIN: CRP: <1 mg/dL (ref 0.5–20.0)

## 2019-11-29 MED ORDER — OMEPRAZOLE 40 MG PO CPDR: 40.0000 mg | DELAYED_RELEASE_CAPSULE | Freq: Every day | ORAL | 5 refills | Status: DC

## 2019-11-29 NOTE — Telephone Encounter (Signed)
Please call her and see if she would consider getting an mri of her head and neck since she is not able to see neurology for a while.  I could order this and and it will be done before she sees neuro and may help clarify if the headaches are coming from her neck.

## 2019-11-29 NOTE — Patient Instructions (Signed)
If you are age 84 or older, your body mass index should be between 23-30. Your Body mass index is 23.04 kg/m. If this is out of the aforementioned range listed, please consider follow up with your Primary Care Provider.  If you are age 12 or younger, your body mass index should be between 19-25. Your Body mass index is 23.04 kg/m. If this is out of the aformentioned range listed, please consider follow up with your Primary Care Provider.   Your provider has requested that you go to the basement level for lab work before leaving today. Press "B" on the elevator. The lab is located at the first door on the left as you exit the elevator.  Continue Omeprazole 40 mg 1 capsule every morning.  Your Appointment with [facility] is January 30, 2020 at 2:00 pm. You need to arrive and check in at 1:30 pm. Due to Covid restriction you may bring only 1 medically necessary person with you, at the point a nurse call you to be roomed they will make a final decision  if they may come to the room with you. You need to be sure to wear a surgical mask to your appointment, NO cloth mask are allowed.

## 2019-11-29 NOTE — Progress Notes (Signed)
Subjective:    Patient ID: Tami Jacobson, female    DOB: [birth year], 86 y.o.   MRN: [id number]  HPI Tami Jacobson is a pleasant 86 year old white female, established with Dr. Silverio Decamp, and known to myself.  She was last seen in May 2021.  She has been followed for chronic GERD, history of prior esophageal stricture requiring dilation and diverticulosis. She has history of hypertension, adult onset diabetes mellitus, congestive heart failure, history of breast cancer and BPPV. She comes in today after onset about 5 weeks ago with daily headaches which she says would occur early in the morning when she would wake up.  These were frontal and more on the right.  She had noticed that at times after eating the headaches seem to worsen.  She did not have any associated nausea or vomiting.  Her appetite has been okay though she said it had been off for a few weeks she has now gained a pound or 2.  No associated fever or chills.  No abdominal pain though says her epigastrium did not feel right "" after eating for a few weeks. The headaches have improved somewhat over the past couple of weeks.  She describes it as an ache and not a severe pain.  She is also having pain at the base of her neck especially with turning.  She gets some dizziness or lightheadedness at times with turning her head. She underwent CT of the head without contrast which was negative for any acute intracranial findings there was stable parenchymal volume loss and chronic microvascular angiopathy.  CBC and be met August 2021 unremarkable. Patient has been evaluated by Dr. Quay Burow and is awaiting appointment with Neurology.  Patient wanted to be seen here to be sure we did not feel that her symptoms were of GI origin.  Review of Systems Pertinent positive and negative review of systems were noted in the above HPI section.  All other review of systems was otherwise negative.  Outpatient Encounter Medications as of 11/29/2019  Medication Sig    . ACCU-CHEK GUIDE test strip USE AS DIRECTED  . aspirin EC 81 MG tablet Take 81 mg by mouth as needed (for headaches).  . blood glucose meter kit and supplies KIT Dispense based on patient and insurance preference. Check sugars once daily and as needed as directed.  . clobetasol cream (TEMOVATE) 3.89 % Apply 1 application topically as needed (for skin irritation- to affected areas).   . cyanocobalamin (,VITAMIN B-12,) 1000 MCG/ML injection Inject 1,000 mcg into the muscle every 30 (thirty) days.   . diphenhydramine-acetaminophen (TYLENOL PM) 25-500 MG TABS tablet Take 1 tablet by mouth at bedtime as needed (for sleep).  . flecainide (TAMBOCOR) 50 MG tablet Take 1 tablet (50 mg total) by mouth 2 (two) times daily.  Marland Kitchen losartan (COZAAR) 100 MG tablet TAKE 1 TABLET BY MOUTH  DAILY  . metFORMIN (GLUCOPHAGE-XR) 500 MG 24 hr tablet TAKE 1 TABLET BY MOUTH ONCE DAILY WITH BREAKFAST (Patient taking differently: Take 500 mg by mouth daily with breakfast. )  . metoprolol succinate (TOPROL-XL) 50 MG 24 hr tablet Take 1 tablet (50 mg total) by mouth daily. Take with or immediately following a meal.  . omeprazole (PRILOSEC) 40 MG capsule Take 1 capsule (40 mg total) by mouth daily.  . tamoxifen (NOLVADEX) 20 MG tablet TAKE 1 TABLET BY MOUTH  DAILY (Patient taking differently: Take 20 mg by mouth daily. )  . [DISCONTINUED] omeprazole (PRILOSEC) 40 MG capsule Take 1 capsule (40  mg total) by mouth daily.  . [DISCONTINUED] hydrALAZINE (APRESOLINE) 25 MG tablet Take 1 tablet (25 mg total) by mouth 3 (three) times daily.  . [DISCONTINUED] prednisoLONE acetate (PRED FORTE) 1 % ophthalmic suspension prednisolone acetate 1 % eye drops,suspension  INSTILL 1 DROP INTO EACH EYE THREE TIMES DAILY FOR 1 WEEK THEN 1 DROP TWICE DAILY FOR SECOND & THIRD WEEK   No facility-administered encounter medications on file as of 11/29/2019.   Allergies  Allergen Reactions  . Statins Other (See Comments)    GI UPSET  . Effexor  [Venlafaxine]     Constipation, shaking, dizzy  . Cephalexin Other (See Comments)    Reaction not recalled by the patient  . Codeine Other (See Comments)    Reaction not recalled by the patient  . Lansoprazole Other (See Comments)    Reaction not recalled by the patient  . Latex Rash       . Levofloxacin Other (See Comments)    Reaction not recalled by the patient  . Nitrofurantoin Other (See Comments)    Reaction not recalled by the patient  . Rofecoxib Other (See Comments)    Reaction not recalled by the patient    Patient Active Problem List   Diagnosis Date Noted  . Headache 10/29/2019  . Palpitations 05/23/2019  . Chronic diastolic CHF (congestive heart failure) ([place]) 05/14/2019  . Hypertensive urgency 05/14/2019  . Mild tricuspid regurgitation 05/13/2019  . LVH (left ventricular hypertrophy), mild 05/13/2019  . Memory difficulty 12/05/2018  . Genetic testing 08/30/2017  . Depression 06/01/2017  . Family history of breast cancer   . Family history of colon cancer   . Family history of pancreatic cancer   . Malignant neoplasm of lower-inner quadrant of right breast of female, estrogen receptor positive ([place]) 10/28/2016  . Abnormal electrocardiogram 05/05/2015  . BPPV (benign paroxysmal positional vertigo) 10/01/2014  . Murmur 04/18/2014  . Anal fissure 12/16/2011  . Fibrocystic breast changes. Left.  Biopsy 07/02/2011. 07/14/2011  . History of breast cancer, Right, T1c, N0, lumpectomy 06/26/2008.   Left, T1c, N0, lumpectomy 06/24/2006. 05/12/2011  . Diabetes mellitus type 2, controlled ([place]) 06/11/2009  . PREMATURE VENTRICULAR CONTRACTIONS 08/19/2008  . B12 deficiency 08/29/2007  . ESOPHAGEAL STRICTURE 04/27/2007  . GERD (gastroesophageal reflux disease) 04/27/2007  . DIVERTICULOSIS, COLON 04/27/2007  . OTHER ALOPECIA 02/14/2007  . Dyslipidemia 01/02/2007  . Essential hypertension 01/02/2007  . Carotid stenosis 08/22/2006   Social History   Socioeconomic History    . Marital status: Divorced    Spouse name: Marcello Moores  . Number of children: 1  . Years of education: Not on file  . Highest education level: Not on file  Occupational History  . Occupation: retire  Tobacco Use  . Smoking status: Never Smoker  . Smokeless tobacco: Never Used  Vaping Use  . Vaping Use: Never used  Substance and Sexual Activity  . Alcohol use: No    Alcohol/week: 0.0 standard drinks  . Drug use: No  . Sexual activity: Not Currently  Other Topics Concern  . Not on file  Social History Narrative  . Not on file   Social Determinants of Health   Financial Resource Strain: Low Risk   . Difficulty of Paying Living Expenses: Not hard at all  Food Insecurity:   . Worried About Charity fundraiser in the Last Year: Not on file  . Ran Out of Food in the Last Year: Not on file  Transportation Needs:   . Lack of  Transportation (Medical): Not on file  . Lack of Transportation (Non-Medical): Not on file  Physical Activity:   . Days of Exercise per Week: Not on file  . Minutes of Exercise per Session: Not on file  Stress:   . Feeling of Stress : Not on file  Social Connections:   . Frequency of Communication with Friends and Family: Not on file  . Frequency of Social Gatherings with Friends and Family: Not on file  . Attends Religious Services: Not on file  . Active Member of Clubs or Organizations: Not on file  . Attends Archivist Meetings: Not on file  . Marital Status: Not on file  Intimate Partner Violence:   . Fear of Current or Ex-Partner: Not on file  . Emotionally Abused: Not on file  . Physically Abused: Not on file  . Sexually Abused: Not on file    Ms. Norman's family history includes Breast cancer (age of onset: 43) in an other family member; Breast cancer (age of onset: 67) in her sister; Breast cancer (age of onset: 52) in her sister; Breast cancer (age of onset: 2) in her sister; COPD in her brother; Colon cancer (age of onset: 17) in her  mother; Heart attack in her mother; Heart disease in her brother, brother, maternal uncle, and sister; Other in her father; Pancreatic cancer in an other family member.      Objective:    Vitals:   11/29/19 1013  BP: 128/74  Pulse: 64    Physical Exam Well-developed well-nourished elderly white female in no acute distress.  Height, Weight 128  HEENT; nontraumatic normocephalic, EOMI, PE RR R LA, sclera anicteric. Oropharynx; not examined Neck; supple, no JVD Cardiovascular; regular rate and rhythm with S1-S2, no murmur rub or gallop Pulmonary; Clear bilaterally Abdomen; soft, nontender, nondistended, no palpable mass or hepatosplenomegaly, bowel sounds are active Rectal; not done today Skin; benign exam, no jaundice rash or appreciable lesions Extremities; no clubbing cyanosis or edema skin warm and dry Neuro/Psych; alert and oriented x4, grossly nonfocal mood and affect appropriate       Assessment & Plan:   #38 86 year old white female with history of chronic GERD, who comes in with new onset of frontal headache over the past 5 weeks, generally occurring early in the mornings, and lasting much of the day.  At onset she felt that sometimes eating would increase the headache or exacerbate the headache.  No associated nausea or vomiting, no abdominal pain.  No fever or chills. She is also been having some neck pain and occasional lightheadedness or dizziness with turning her head. Etiology of symptoms is not clear.  CT of the head without contrast unrevealing.  I do not think her symptoms are of GI origin.   #2 hypertension 3.  Adult onset diabetes mellitus 4.  Congestive heart failure 5.  History of breast cancer 6.  BPPV  Plan; patient needs neuro evaluation.  She has been referred to [facility] neurology.  We had called their office today to check on referral and they do not have any availability until October. Patient advised to get in touch with Dr. Quay Burow. Continue  omeprazole 20 mg p.o. every morning. Check sed rate and CRP today-rule out temporal arteritis-patient needs MRI of the brain and cervical spine MRI-I will communicate with Dr. Quay Burow, perhaps these can be obtained prior to her seeing neurology.   Fynn Adel S Adelyna Brockman PA-C 11/29/2019   Cc: Binnie Rail, MD

## 2019-11-30 NOTE — Addendum Note (Signed)
Addended by: Binnie Rail on: 11/30/2019 01:31 PM   Modules accepted: Orders

## 2019-11-30 NOTE — Telephone Encounter (Signed)
Pt would like to proceed 

## 2019-12-06 ENCOUNTER — Other Ambulatory Visit: Payer: Self-pay

## 2019-12-06 ENCOUNTER — Ambulatory Visit (INDEPENDENT_AMBULATORY_CARE_PROVIDER_SITE_OTHER): Payer: Medicare Other

## 2019-12-06 DIAGNOSIS — E538 Deficiency of other specified B group vitamins: Secondary | ICD-10-CM

## 2019-12-06 MED ORDER — CYANOCOBALAMIN 1000 MCG/ML IJ SOLN
1000.0000 ug | Freq: Once | INTRAMUSCULAR | Status: AC
  Administered 2019-12-06: 1000 ug via INTRAMUSCULAR

## 2019-12-06 NOTE — Progress Notes (Signed)
Pt here for monthly B12 injection per Dr. Burns  B12 1000mcg given IM, and pt tolerated injection well.  Pt is to schedule next monthly B12 injection at check out.  Please sign in PCP's absence 

## 2019-12-26 ENCOUNTER — Other Ambulatory Visit: Payer: Self-pay | Admitting: Oncology

## 2019-12-26 DIAGNOSIS — C50311 Malignant neoplasm of lower-inner quadrant of right female breast: Secondary | ICD-10-CM

## 2019-12-27 ENCOUNTER — Telehealth: Payer: Self-pay | Admitting: Internal Medicine

## 2019-12-27 NOTE — Telephone Encounter (Signed)
ok 

## 2019-12-27 NOTE — Telephone Encounter (Signed)
Patient calling to follow-up regarding headaches. Several months ago, she had a toothache, tried to get removed, tooth infected could not numb it, patient on antibiotics, on Monday 9.28.21 the dentist is going to try to remove the tooth  She believes this is cause of headache and is cancelling MRI and Neurology appointments

## 2019-12-31 ENCOUNTER — Encounter: Payer: Self-pay | Admitting: Gastroenterology

## 2020-01-01 ENCOUNTER — Other Ambulatory Visit: Payer: Medicare Other

## 2020-01-07 ENCOUNTER — Ambulatory Visit (INDEPENDENT_AMBULATORY_CARE_PROVIDER_SITE_OTHER): Payer: Medicare Other

## 2020-01-07 ENCOUNTER — Telehealth: Payer: Self-pay

## 2020-01-07 ENCOUNTER — Other Ambulatory Visit: Payer: Self-pay

## 2020-01-07 DIAGNOSIS — E538 Deficiency of other specified B group vitamins: Secondary | ICD-10-CM | POA: Diagnosis not present

## 2020-01-07 DIAGNOSIS — Z23 Encounter for immunization: Secondary | ICD-10-CM | POA: Diagnosis not present

## 2020-01-07 MED ORDER — CYANOCOBALAMIN 1000 MCG/ML IJ SOLN
1000.0000 ug | Freq: Once | INTRAMUSCULAR | Status: AC
  Administered 2020-01-07: 1000 ug via INTRAMUSCULAR

## 2020-01-07 NOTE — Telephone Encounter (Signed)
See phone note from 9/23.  She cancelled the MRI, which is fine and I agree with since she is not longer have headaches.   Yes it is ok for her to cancel the neurology appointment unless she still wants them to evaluate her memory which was one of the reasons she was referred.

## 2020-01-07 NOTE — Progress Notes (Signed)
Pt here for monthly B12 injection per Dr Burns. B12 1000mcg given IM left deltoid and pt tolerated injection well.  Pt to schedule next B12 injection upon check out.  

## 2020-01-07 NOTE — Telephone Encounter (Signed)
Pt has questions about her upcoming Neurology appt.  She would like to know if she still needs to keep the appt b/c she is no longer having h/a since she got a tooth repaired (abscess).  She also states that you canceled her MRI.

## 2020-01-08 NOTE — Telephone Encounter (Signed)
Pt notified of Dr Quay Burow response.  She states she will keep 10/27 Neurology appt.

## 2020-01-30 ENCOUNTER — Encounter: Payer: Self-pay | Admitting: Neurology

## 2020-01-30 ENCOUNTER — Ambulatory Visit (INDEPENDENT_AMBULATORY_CARE_PROVIDER_SITE_OTHER): Payer: Medicare Other | Admitting: Neurology

## 2020-01-30 VITALS — BP 172/67 | HR 72 | Ht 62.0 in | Wt 130.0 lb

## 2020-01-30 DIAGNOSIS — R413 Other amnesia: Secondary | ICD-10-CM | POA: Insufficient documentation

## 2020-01-30 DIAGNOSIS — I6529 Occlusion and stenosis of unspecified carotid artery: Secondary | ICD-10-CM | POA: Diagnosis not present

## 2020-01-30 NOTE — Patient Instructions (Signed)

## 2020-01-30 NOTE — Progress Notes (Addendum)
SLEEP MEDICINE CLINIC    Provider:  Larey Seat, MD  Primary Care Physician:  Binnie Rail, MD [place] [address]     Referring Provider: Binnie Rail, [place],  [place] [address]          Chief Complaint according to patient   Patient presents with:    . New Patient (Initial Visit)     " my doctor wanted my memory tested, I am not concerned about it"       HISTORY OF PRESENT ILLNESS:  Tami Jacobson is a 86 year old Caucasian female patient seen here as a referral on 01/30/2020 from PCP.  Chief concern according to patient : " I am driving, and handling all my affairs"  I have the pleasure of seeing Tami Jacobson today, a right -handed  Caucasian female who  has a past medical history of Arthritis, Breast cancer, right ([facility]) (11/2016), Family history of breast cancer, Family history of colon cancer, Family history of pancreatic cancer, GERD (gastroesophageal reflux disease), Non-insulin dependent type 2 diabetes mellitus ([facility]), PAC (premature atrial contraction), Personal history of radiation therapy (12/2016), Premature ventricular contraction, Seasonal allergies, Sensitive skin, and Vitamin B12 deficiency.      Family medical history: patient reports she has had breast cancer 5 times, last in 2018 , treated with radiation. A twin sister died within 70 days after their birthday-  3 sisters have died of breast cancer. She had been on of 7 children, 6 daughters, only one brother is living in [place] at age 19.   Social history:  Patient is retired from [employer], 1949-17 years, later for [employer], [place] services" and quit in 1970 and had her only child, a daughter, after 16 years of trying. .  She lives in a household with alone in a town-home, no pets. Family status is widowed since April 2021- she has one grandson  Age 27.   Tobacco use-none .  ETOH use ;none ,  Caffeine intake in form of Coffee( decaffeinated at  home  ) Soda(/) Tea ( in restaurants ) energy drinks.   Sleep habits are as follows: The patient's meal times are regular, breakfast being her main meal.     Review of Systems: Out of a complete 14 system review, the patient complains of only the following symptoms, and all other reviewed systems are negative.:    Headaches were cured by dentist who pulled a tooth.   Social History   Socioeconomic History  . Marital status: Divorced    Spouse name: Marcello Moores  . Number of children: 1  . Years of education: Not on file  . Highest education level: Not on file  Occupational History  . Occupation: retire  Tobacco Use  . Smoking status: Never Smoker  . Smokeless tobacco: Never Used  Vaping Use  . Vaping Use: Never used  Substance and Sexual Activity  . Alcohol use: No    Alcohol/week: 0.0 standard drinks  . Drug use: No  . Sexual activity: Not Currently  Other Topics Concern  . Not on file  Social History Narrative  . Not on file   Social Determinants of Health   Financial Resource Strain: Low Risk   . Difficulty of Paying Living Expenses: Not hard at all  Food Insecurity:   . Worried About Charity fundraiser in the Last Year: Not on file  . Ran Out of Food in the Last  Year: Not on file  Transportation Needs:   . Lack of Transportation (Medical): Not on file  . Lack of Transportation (Non-Medical): Not on file  Physical Activity:   . Days of Exercise per Week: Not on file  . Minutes of Exercise per Session: Not on file  Stress:   . Feeling of Stress : Not on file  Social Connections:   . Frequency of Communication with Friends and Family: Not on file  . Frequency of Social Gatherings with Friends and Family: Not on file  . Attends Religious Services: Not on file  . Active Member of Clubs or Organizations: Not on file  . Attends Archivist Meetings: Not on file  . Marital Status: Not on file    Family History  Problem Relation Age of Onset  . Colon cancer  Mother 28  . Heart attack Mother   . Breast cancer Sister 40  . Heart disease Maternal Uncle        CABG  . Breast cancer Sister 40  . Heart disease Brother   . Other Father        suicide  . Breast cancer Sister 34  . Heart disease Sister   . COPD Brother   . Heart disease Brother   . Breast cancer Other 28       niece - brother's daughter  . Pancreatic cancer Other        brother's son, dx in his 67s    Past Medical History:  Diagnosis Date  . Arthritis    left hip  . Breast cancer, right ([facility]) 11/2016  . Family history of breast cancer   . Family history of colon cancer   . Family history of pancreatic cancer   . GERD (gastroesophageal reflux disease)   . Non-insulin dependent type 2 diabetes mellitus ([facility])   . PAC (premature atrial contraction)   . Personal history of radiation therapy 12/2016  . Premature ventricular contraction   . Seasonal allergies   . Sensitive skin   . Vitamin B12 deficiency     Past Surgical History:  Procedure Laterality Date  . ABDOMINAL HYSTERECTOMY  1979   partial  . BREAST BIOPSY  07/02/2011   Procedure: BREAST BIOPSY WITH NEEDLE LOCALIZATION;  Surgeon: Shann Medal, MD;  Location: [facility];  Service: General;  Laterality: Left;  left breast atypical hyperplasia needle localization biopsy  . BREAST BIOPSY Left 06/13/2006  . BREAST BIOPSY Right 06/27/2008  . BREAST BIOPSY Right 09/27/2016  . BREAST EXCISIONAL BIOPSY Left 07/02/2011  . BREAST LUMPECTOMY Left 07/04/2006  . BREAST LUMPECTOMY Right 06/26/2008  . BREAST LUMPECTOMY Right 09/27/2016  . BREAST LUMPECTOMY WITH RADIOACTIVE SEED LOCALIZATION Right 11/18/2016   Procedure: RIGHT BREAST LUMPECTOMY WITH RADIOACTIVE SEED LOCALIZATION ERAS PATHWAY;  Surgeon: Alphonsa Overall, MD;  Location: [facility];  Service: General;  Laterality: Right;  ERAS PATHWAY  . CATARACT EXTRACTION Bilateral   . CHOLECYSTECTOMY, LAPAROSCOPIC  07/07/2001   had appendex taking out at the same  time  . ESOPHAGEAL DILATION  2006  . LAPAROSCOPIC APPENDECTOMY  07/07/2001  . TONSILLECTOMY  1942     Current Outpatient Medications on File Prior to Visit  Medication Sig Dispense Refill  . ACCU-CHEK GUIDE test strip USE AS DIRECTED 100 strip 3  . aspirin EC 81 MG tablet Take 81 mg by mouth as needed (for headaches).    . blood glucose meter kit and supplies KIT Dispense based on patient and insurance preference. Check  sugars once daily and as needed as directed. 1 each 0  . clobetasol cream (TEMOVATE) 2.97 % Apply 1 application topically as needed (for skin irritation- to affected areas).     . cyanocobalamin (,VITAMIN B-12,) 1000 MCG/ML injection Inject 1,000 mcg into the muscle every 30 (thirty) days.     . diphenhydramine-acetaminophen (TYLENOL PM) 25-500 MG TABS tablet Take 1 tablet by mouth at bedtime as needed (for sleep).    . flecainide (TAMBOCOR) 50 MG tablet Take 1 tablet (50 mg total) by mouth 2 (two) times daily. 180 tablet 3  . losartan (COZAAR) 100 MG tablet TAKE 1 TABLET BY MOUTH  DAILY 90 tablet 3  . metFORMIN (GLUCOPHAGE-XR) 500 MG 24 hr tablet TAKE 1 TABLET BY MOUTH ONCE DAILY WITH BREAKFAST (Patient taking differently: Take 500 mg by mouth daily with breakfast. ) 90 tablet 3  . metoprolol succinate (TOPROL-XL) 50 MG 24 hr tablet Take 1 tablet (50 mg total) by mouth daily. Take with or immediately following a meal. 30 tablet 0  . omeprazole (PRILOSEC) 40 MG capsule Take 1 capsule (40 mg total) by mouth daily. 30 capsule 5  . tamoxifen (NOLVADEX) 20 MG tablet TAKE 1 TABLET BY MOUTH  DAILY 90 tablet 0   No current facility-administered medications on file prior to visit.    Allergies  Allergen Reactions  . Statins Other (See Comments)    GI UPSET  . Effexor [Venlafaxine]     Constipation, shaking, dizzy  . Cephalexin Other (See Comments)    Reaction not recalled by the patient  . Codeine Other (See Comments)    Reaction not recalled by the patient  . Lansoprazole  Other (See Comments)    Reaction not recalled by the patient  . Latex Rash       . Levofloxacin Other (See Comments)    Reaction not recalled by the patient  . Nitrofurantoin Other (See Comments)    Reaction not recalled by the patient  . Rofecoxib Other (See Comments)    Reaction not recalled by the patient     Physical exam:  Today's Vitals   01/30/20 1349  BP: (!) 172/67  Pulse: 72  Weight: 130 lb (59 kg)  Height: 5' 2" (1.575 m)   Body mass index is 23.78 kg/m.   Wt Readings from Last 3 Encounters:  01/30/20 130 lb (59 kg)  11/29/19 128 lb (58.1 kg)  11/27/19 126 lb (57.2 kg)     Ht Readings from Last 3 Encounters:  01/30/20 5' 2" (1.575 m)  11/29/19 5' 2.5" (1.588 m)  11/27/19 5' 2" (1.575 m)      General: The patient is awake, alert and appears not in acute distress. The patient is well groomed. Head: Normocephalic, atraumatic. Neck is supple. Cardiovascular:  Regular rate and cardiac rhythm by pulse,  without distended neck veins. Respiratory: Lungs are clear to auscultation.  Skin:  Without evidence of ankle edema, or rash. Trunk: The patient's posture is erect.   Neurologic exam : The patient is awake and alert, oriented to place and time.   Memory subjective described as intact.  mini-mental status exam No flowsheet data found.  MMSE was 27/ 30 points. 5 animals were named, clockface was correct but not the hands .    Attention span & concentration ability appears normal.  Speech is fluent,  without  dysarthria, dysphonia or aphasia.  Mood and affect are appropriate.   Cranial nerves: no loss of smell or taste reported  Pupils  are equal and briskly reactive to light. Funduscopic exam deferred.  Status post cataract surgery. Wearing bifocals.  Extraocular movements in vertical and horizontal planes were intact and without nystagmus. No Diplopia. Visual fields by finger perimetry are intact. Hearing was impaired.  Facial sensation intact to fine  touch.  Facial motor strength is symmetric and tongue and uvula move midline.  Neck ROM : rotation, tilt and flexion extension were normal for age and shoulder shrug was symmetrical.    Motor exam:  Symmetric bulk, tone and ROM.   Normal tone without cog wheeling, symmetric grip strength .   Sensory:  Fine touch, pinprick and vibration were tested  and  normal.  Proprioception tested in the upper extremities was normal.   Coordination: Rapid alternating movements in the fingers/hands were of normal speed. Beautiful penmanship.  The Finger-to-nose maneuver was intact without evidence of ataxia, dysmetria.   Gait and station: Patient could rise unassisted , walked with cane as assistive device. Uses it outdoors.  Stance is of normal width/ base and the patient turned with 4 steps.  Toe and heel walk were deferred.  Deep tendon reflexes: in the  upper and lower extremities are symmetric and intact. They are brisk.   Babinski response was deferred.        After spending a total time of  45  minutes face to face and additional time for physical and neurologic examination, review of laboratory studies,  personal review of imaging studies, reports and results of other testing and review of referral information / records as far as provided in visit, I have established the following assessments:  1) the patient has a remarkable cognitive ability for her age - hearing impairment may have complicated the testing, still -she scored 27 out of 30 points on MMSE. She attempted a trail making test on MOCA and couldn't complete that one, we switched to MMSE..    My Plan is to proceed with:  1)  The patient has scored a normal MMSE for age and peer group-  2)  her handwriting and finger to nose test did not reveal tremor.  3)  She  denies having gotten lost, misplacing items, or missing appointments   I would like to thank Quay Burow, Claudina Lick, MD and Binnie Rail, [place],  [place]  [id number] for allowing me to meet with and to take care of this pleasant patient.    I plan to follow up either personally or through our NP PRN.     Electronically signed by: Larey Seat, MD 01/30/2020 2:21 PM  [facility] Board certified by The [employer] of Sleep Medicine and Diplomate of the [employer] of Sleep Medicine. Board certified In Neurology through the [place], Fellow of the [employer] of Neurology. Medical Director of [facility].

## 2020-02-01 ENCOUNTER — Other Ambulatory Visit: Payer: Self-pay | Admitting: Family

## 2020-02-01 ENCOUNTER — Telehealth: Payer: Self-pay | Admitting: Family

## 2020-02-01 DIAGNOSIS — R9389 Abnormal findings on diagnostic imaging of other specified body structures: Secondary | ICD-10-CM

## 2020-02-01 NOTE — Telephone Encounter (Signed)
She is due for a 6 month repeat chest CT; I am going to order this for her.  This is in follow-up from the test that was done in April of this year- to make sure the changes noted on her lungs are consistent with stable scarring.

## 2020-02-01 NOTE — Telephone Encounter (Signed)
-----   Message from Marrian Salvage, [place] sent at 07/30/2019 11:20 AM EDT ----- Needs repeat chest CT

## 2020-02-06 NOTE — Progress Notes (Addendum)
Subjective:    Patient ID: Tami Jacobson, female    DOB: 1927/12/02, 86 y.o.   MRN: [id number]  HPI The patient is here for follow up of their chronic medical problems, including htn, B12 def, DM, hyperlipidemia, GERD  Her BP has been elevated at home the past couple of weeks.  150's-180's.  She has had some increased stress with family issues.  She is taking her medication daily.  She is not exercising regularly.     Medications and allergies reviewed with patient and updated if appropriate.  Patient Active Problem List   Diagnosis Date Noted  . Mild memory disturbance 01/30/2020  . Headache 10/29/2019  . Palpitations 05/23/2019  . Chronic diastolic CHF (congestive heart failure) ([facility]) 05/14/2019  . Hypertensive urgency 05/14/2019  . Mild tricuspid regurgitation 05/13/2019  . LVH (left ventricular hypertrophy), mild 05/13/2019  . Genetic testing 08/30/2017  . Depression 06/01/2017  . Family history of breast cancer   . Family history of colon cancer   . Family history of pancreatic cancer   . Abnormal electrocardiogram 05/05/2015  . BPPV (benign paroxysmal positional vertigo) 10/01/2014  . Murmur 04/18/2014  . Anal fissure 12/16/2011  . Fibrocystic breast changes. Left.  Biopsy 07/02/2011. 07/14/2011  . History of breast cancer, Right, T1c, N0, lumpectomy 06/26/2008.   Left, T1c, N0, lumpectomy 06/24/2006. 05/12/2011  . Diabetes mellitus type 2, controlled ([facility]) 06/11/2009  . PREMATURE VENTRICULAR CONTRACTIONS 08/19/2008  . B12 deficiency 08/29/2007  . ESOPHAGEAL STRICTURE 04/27/2007  . GERD (gastroesophageal reflux disease) 04/27/2007  . DIVERTICULOSIS, COLON 04/27/2007  . OTHER ALOPECIA 02/14/2007  . Dyslipidemia 01/02/2007  . Essential hypertension 01/02/2007  . Carotid stenosis 08/22/2006    Current Outpatient Medications on File Prior to Visit  Medication Sig Dispense Refill  . ACCU-CHEK GUIDE test strip USE AS DIRECTED 100 strip 3  . aspirin EC 81 MG  tablet Take 81 mg by mouth as needed (for headaches).    . blood glucose meter kit and supplies KIT Dispense based on patient and insurance preference. Check sugars once daily and as needed as directed. 1 each 0  . clobetasol cream (TEMOVATE) 6.14 % Apply 1 application topically as needed (for skin irritation- to affected areas).     . cyanocobalamin (,VITAMIN B-12,) 1000 MCG/ML injection Inject 1,000 mcg into the muscle every 30 (thirty) days.     . diphenhydramine-acetaminophen (TYLENOL PM) 25-500 MG TABS tablet Take 1 tablet by mouth at bedtime as needed (for sleep).    . flecainide (TAMBOCOR) 50 MG tablet Take 1 tablet (50 mg total) by mouth 2 (two) times daily. 180 tablet 3  . losartan (COZAAR) 100 MG tablet TAKE 1 TABLET BY MOUTH  DAILY 90 tablet 3  . metFORMIN (GLUCOPHAGE-XR) 500 MG 24 hr tablet TAKE 1 TABLET BY MOUTH ONCE DAILY WITH BREAKFAST (Patient taking differently: Take 500 mg by mouth daily with breakfast. ) 90 tablet 3  . metoprolol succinate (TOPROL-XL) 50 MG 24 hr tablet Take 1 tablet (50 mg total) by mouth daily. Take with or immediately following a meal. 30 tablet 0  . omeprazole (PRILOSEC) 40 MG capsule Take 1 capsule (40 mg total) by mouth daily. 30 capsule 5  . tamoxifen (NOLVADEX) 20 MG tablet TAKE 1 TABLET BY MOUTH  DAILY 90 tablet 0  . terconazole (TERAZOL 7) 0.4 % vaginal cream Place 1 applicator vaginally at bedtime.     No current facility-administered medications on file prior to visit.    Past Medical History:  Diagnosis Date  . Arthritis    left hip  . Breast cancer, right ([facility]) 11/2016  . Family history of breast cancer   . Family history of colon cancer   . Family history of pancreatic cancer   . GERD (gastroesophageal reflux disease)   . Non-insulin dependent type 2 diabetes mellitus ([facility])   . PAC (premature atrial contraction)   . Personal history of radiation therapy 12/2016  . Premature ventricular contraction   . Seasonal allergies   . Sensitive  skin   . Vitamin B12 deficiency     Past Surgical History:  Procedure Laterality Date  . ABDOMINAL HYSTERECTOMY  1979   partial  . BREAST BIOPSY  07/02/2011   Procedure: BREAST BIOPSY WITH NEEDLE LOCALIZATION;  Surgeon: Shann Medal, MD;  Location: [facility];  Service: General;  Laterality: Left;  left breast atypical hyperplasia needle localization biopsy  . BREAST BIOPSY Left 06/13/2006  . BREAST BIOPSY Right 06/27/2008  . BREAST BIOPSY Right 09/27/2016  . BREAST EXCISIONAL BIOPSY Left 07/02/2011  . BREAST LUMPECTOMY Left 07/04/2006  . BREAST LUMPECTOMY Right 06/26/2008  . BREAST LUMPECTOMY Right 09/27/2016  . BREAST LUMPECTOMY WITH RADIOACTIVE SEED LOCALIZATION Right 11/18/2016   Procedure: RIGHT BREAST LUMPECTOMY WITH RADIOACTIVE SEED LOCALIZATION ERAS PATHWAY;  Surgeon: Alphonsa Overall, MD;  Location: [facility];  Service: General;  Laterality: Right;  ERAS PATHWAY  . CATARACT EXTRACTION Bilateral   . CHOLECYSTECTOMY, LAPAROSCOPIC  07/07/2001   had appendex taking out at the same time  . ESOPHAGEAL DILATION  2006  . LAPAROSCOPIC APPENDECTOMY  07/07/2001  . TONSILLECTOMY  1942    Social History   Socioeconomic History  . Marital status: Divorced    Spouse name: Marcello Moores  . Number of children: 1  . Years of education: Not on file  . Highest education level: Not on file  Occupational History  . Occupation: retire  Tobacco Use  . Smoking status: Never Smoker  . Smokeless tobacco: Never Used  Vaping Use  . Vaping Use: Never used  Substance and Sexual Activity  . Alcohol use: No    Alcohol/week: 0.0 standard drinks  . Drug use: No  . Sexual activity: Not Currently  Other Topics Concern  . Not on file  Social History Narrative  . Not on file   Social Determinants of Health   Financial Resource Strain: Low Risk   . Difficulty of Paying Living Expenses: Not hard at all  Food Insecurity:   . Worried About Charity fundraiser in the Last Year: Not on file  .  Ran Out of Food in the Last Year: Not on file  Transportation Needs:   . Lack of Transportation (Medical): Not on file  . Lack of Transportation (Non-Medical): Not on file  Physical Activity:   . Days of Exercise per Week: Not on file  . Minutes of Exercise per Session: Not on file  Stress:   . Feeling of Stress : Not on file  Social Connections:   . Frequency of Communication with Friends and Family: Not on file  . Frequency of Social Gatherings with Friends and Family: Not on file  . Attends Religious Services: Not on file  . Active Member of Clubs or Organizations: Not on file  . Attends Archivist Meetings: Not on file  . Marital Status: Not on file    Family History  Problem Relation Age of Onset  . Colon cancer Mother 65  . Heart attack Mother   .  Breast cancer Sister 63  . Heart disease Maternal Uncle        CABG  . Breast cancer Sister 54  . Heart disease Brother   . Other Father        suicide  . Breast cancer Sister 74  . Heart disease Sister   . COPD Brother   . Heart disease Brother   . Breast cancer Other 51       niece - brother's daughter  . Pancreatic cancer Other        brother's son, dx in his 61s    Review of Systems  Constitutional: Negative for fever.  HENT: Positive for trouble swallowing (occ).   Respiratory: Negative for cough, shortness of breath and wheezing.   Cardiovascular: Positive for chest pain (occ r sided chest pain). Negative for palpitations and leg swelling.  Neurological: Positive for dizziness (occ with quick movements) and headaches (occ).       Objective:   Vitals:   02/07/20 0810  BP: (!) 168/78  Pulse: 69  Temp: 97.9 F (36.6 C)  SpO2: 97%   BP Readings from Last 3 Encounters:  02/07/20 (!) 168/78  01/30/20 (!) 172/67  11/29/19 128/74   Wt Readings from Last 3 Encounters:  02/07/20 127 lb (57.6 kg)  01/30/20 130 lb (59 kg)  11/29/19 128 lb (58.1 kg)   Body mass index is 23.23 kg/m.   Physical  Exam    Constitutional: Appears well-developed and well-nourished. No distress.  HENT:  Head: Normocephalic and atraumatic.  Neck: Neck supple. No tracheal deviation present. No thyromegaly present.  No cervical lymphadenopathy Cardiovascular: Normal rate, regular rhythm and normal heart sounds.   3/6 sys murmur heard. No carotid bruit .  No edema Pulmonary/Chest: Effort normal and breath sounds normal. No respiratory distress. No has no wheezes. No rales.  Skin: Skin is warm and dry. Not diaphoretic.  Psychiatric: Normal mood and affect. Behavior is normal.   Depression screen [facility] 2/9 02/07/2020 10/03/2018 09/28/2018 09/22/2017 02/28/2017  Decreased Interest 0 2 1 0 0  Down, Depressed, Hopeless '1 2 3 1 1  ' PHQ - 2 Score '1 4 4 1 1  ' Altered sleeping 0 0 1 0 -  Tired, decreased energy '1 2 2 ' 0 -  Change in appetite '1 2 1 1 ' -  Feeling bad or failure about yourself  0 '2 1 1 ' -  Trouble concentrating 0 0 0 0 -  Moving slowly or fidgety/restless 0 0 0 0 -  Suicidal thoughts 0 0 1 0 -  PHQ-9 Score '3 10 10 3 ' -  Difficult doing work/chores Not difficult at all - Not difficult at all Not difficult at all -  Some recent data might be hidden     Assessment & Plan:   Screened for depression using the PHQ 9 scale.  She has minimal depression.   When asked if she feels depressed she states no, except for occasionally she will feel depressed.  She has tried a few medications in the past and has had side effects and does not want to try anything.   See Problem List for Assessment and Plan of chronic medical problems.    This visit occurred during the SARS-CoV-2 public health emergency.  Safety protocols were in place, including screening questions prior to the visit, additional usage of staff PPE, and extensive cleaning of exam room while observing appropriate contact time as indicated for disinfecting solutions.

## 2020-02-06 NOTE — Assessment & Plan Note (Addendum)
Chronic Continue metformin Xr 500 mg once daily Check a1c

## 2020-02-06 NOTE — Patient Instructions (Addendum)
  Blood work was ordered.    B12 injection today.   Medications reviewed and updated.  Changes include :   Start hydralazine 10 mg three times a day.  Continue all your other medications.    Your prescription(s) have been submitted to your pharmacy. Please take as directed and contact our office if you believe you are having problem(s) with the medication(s).      Please followup in 4 weeks

## 2020-02-07 ENCOUNTER — Encounter: Payer: Self-pay | Admitting: Internal Medicine

## 2020-02-07 ENCOUNTER — Other Ambulatory Visit: Payer: Self-pay

## 2020-02-07 ENCOUNTER — Ambulatory Visit: Payer: Medicare Other

## 2020-02-07 ENCOUNTER — Ambulatory Visit (INDEPENDENT_AMBULATORY_CARE_PROVIDER_SITE_OTHER): Payer: Medicare Other | Admitting: Internal Medicine

## 2020-02-07 VITALS — BP 168/78 | HR 69 | Temp 97.9°F | Ht 62.0 in | Wt 127.0 lb

## 2020-02-07 DIAGNOSIS — K219 Gastro-esophageal reflux disease without esophagitis: Secondary | ICD-10-CM | POA: Diagnosis not present

## 2020-02-07 DIAGNOSIS — E118 Type 2 diabetes mellitus with unspecified complications: Secondary | ICD-10-CM

## 2020-02-07 DIAGNOSIS — E538 Deficiency of other specified B group vitamins: Secondary | ICD-10-CM

## 2020-02-07 DIAGNOSIS — I10 Essential (primary) hypertension: Secondary | ICD-10-CM

## 2020-02-07 DIAGNOSIS — F3289 Other specified depressive episodes: Secondary | ICD-10-CM

## 2020-02-07 DIAGNOSIS — E785 Hyperlipidemia, unspecified: Secondary | ICD-10-CM | POA: Diagnosis not present

## 2020-02-07 LAB — CBC WITH DIFFERENTIAL/PLATELET
Basophils Absolute: 0 10*3/uL (ref 0.0–0.1)
Basophils Relative: 0.8 % (ref 0.0–3.0)
Eosinophils Absolute: 0.1 10*3/uL (ref 0.0–0.7)
Eosinophils Relative: 1.7 % (ref 0.0–5.0)
HCT: 33.2 % — ABNORMAL LOW (ref 36.0–46.0)
Hemoglobin: 11.2 g/dL — ABNORMAL LOW (ref 12.0–15.0)
Lymphocytes Relative: 18.2 % (ref 12.0–46.0)
Lymphs Abs: 0.8 10*3/uL (ref 0.7–4.0)
MCHC: 33.6 g/dL (ref 30.0–36.0)
MCV: 93.4 fl (ref 78.0–100.0)
Monocytes Absolute: 0.4 10*3/uL (ref 0.1–1.0)
Monocytes Relative: 8.1 % (ref 3.0–12.0)
Neutro Abs: 3.1 10*3/uL (ref 1.4–7.7)
Neutrophils Relative %: 71.2 % (ref 43.0–77.0)
Platelets: 130 10*3/uL — ABNORMAL LOW (ref 150.0–400.0)
RBC: 3.56 Mil/uL — ABNORMAL LOW (ref 3.87–5.11)
RDW: 15 % (ref 11.5–15.5)
WBC: 4.3 10*3/uL (ref 4.0–10.5)

## 2020-02-07 LAB — COMPREHENSIVE METABOLIC PANEL
ALT: 13 U/L (ref 0–35)
AST: 15 U/L (ref 0–37)
Albumin: 3.8 g/dL (ref 3.5–5.2)
Alkaline Phosphatase: 43 U/L (ref 39–117)
BUN: 11 mg/dL (ref 6–23)
CO2: 30 mEq/L (ref 19–32)
Calcium: 9.2 mg/dL (ref 8.4–10.5)
Chloride: 102 mEq/L (ref 96–112)
Creatinine, Ser: 0.96 mg/dL (ref 0.40–1.20)
GFR: 52.16 mL/min — ABNORMAL LOW (ref >60.00)
Glucose, Bld: 285 mg/dL — ABNORMAL HIGH (ref 70–99)
Potassium: 4 mEq/L (ref 3.5–5.1)
Sodium: 137 mEq/L (ref 135–145)
Total Bilirubin: 0.5 mg/dL (ref 0.2–1.2)
Total Protein: 6.4 g/dL (ref 6.0–8.3)

## 2020-02-07 LAB — HEMOGLOBIN A1C: Hgb A1c MFr Bld: 6.7 % — ABNORMAL HIGH (ref 4.6–6.5)

## 2020-02-07 MED ORDER — CYANOCOBALAMIN 1000 MCG/ML IJ SOLN
1000.0000 ug | Freq: Once | INTRAMUSCULAR | Status: AC
  Administered 2020-02-07: 1000 ug via INTRAMUSCULAR

## 2020-02-07 MED ORDER — HYDRALAZINE HCL 10 MG PO TABS: 10.0000 mg | ORAL_TABLET | Freq: Three times a day (TID) | ORAL | 5 refills | Status: DC

## 2020-02-07 NOTE — Assessment & Plan Note (Signed)
Chronic BP well controlled Continue losartan 100 mg daily, metoprolol XL 50 mg daily, start hydralazine 10 mg TID cmp

## 2020-02-07 NOTE — Addendum Note (Signed)
Addended by: Marcina Millard on: 02/07/2020 05:03 PM   Modules accepted: Orders

## 2020-02-07 NOTE — Assessment & Plan Note (Signed)
Chronic B12 injections monthly - due for one today - given

## 2020-02-07 NOTE — Assessment & Plan Note (Signed)
Chronic, mild and intermittent PHQ  9 score 3-minimal depression, but she denies feeling depressed Has tried medications in the past and has not tolerated them Will monitor

## 2020-02-07 NOTE — Assessment & Plan Note (Signed)
Chronic Check lipid panel  Controlled with lifestyle - intolerant of statins and PCSK9  Regular exercise and healthy diet encouraged

## 2020-02-07 NOTE — Assessment & Plan Note (Signed)
Chronic GERD controlled Continue omeprazole 40 mg daily 

## 2020-02-08 DIAGNOSIS — H52203 Unspecified astigmatism, bilateral: Secondary | ICD-10-CM | POA: Diagnosis not present

## 2020-02-08 DIAGNOSIS — H04223 Epiphora due to insufficient drainage, bilateral lacrimal glands: Secondary | ICD-10-CM | POA: Diagnosis not present

## 2020-02-13 ENCOUNTER — Ambulatory Visit: Payer: Medicare Other | Admitting: Internal Medicine

## 2020-02-14 ENCOUNTER — Ambulatory Visit
Admission: RE | Admit: 2020-02-14 | Discharge: 2020-02-14 | Disposition: A | Discharge status: Home / Self Care | Payer: Medicare Other | Source: Ambulatory Visit | Attending: Family | Admitting: Family

## 2020-02-14 ENCOUNTER — Other Ambulatory Visit: Payer: Self-pay

## 2020-02-14 DIAGNOSIS — J984 Other disorders of lung: Secondary | ICD-10-CM | POA: Diagnosis not present

## 2020-02-14 DIAGNOSIS — J9811 Atelectasis: Secondary | ICD-10-CM | POA: Diagnosis not present

## 2020-02-14 DIAGNOSIS — Z853 Personal history of malignant neoplasm of breast: Secondary | ICD-10-CM | POA: Diagnosis not present

## 2020-02-14 DIAGNOSIS — R9389 Abnormal findings on diagnostic imaging of other specified body structures: Secondary | ICD-10-CM

## 2020-02-14 DIAGNOSIS — J8410 Pulmonary fibrosis, unspecified: Secondary | ICD-10-CM | POA: Diagnosis not present

## 2020-02-18 NOTE — Progress Notes (Signed)
HPI: FU palpitations and cerebrovascular disease; history of PACs and PVCs with normal LV function. She had significant palpitations in the past despite beta-blockade. She is therefore on flecainide 50 mg p.o. b.i.d. A previous catheterization in June 2000 showed normal coronary arteries and normal LV function. Nuclear study February 2017 showed ejection fraction 72%and normal perfusion.Carotid dopplers 2/18 showed1-39 bilateral stenosis. Echocardiogram February 2021 showed normal LV function, mild left ventricular hypertrophy, grade 1 diastolic dysfunction, mild left atrial enlargement.  Chest CT April 2021 showed biapical scarring and coronary artery calcification.  Since I last saw her,she notes increased palpitations.  She denies exertional chest pain or syncope.  She has some dyspnea on exertion.  Current Outpatient Medications  Medication Sig Dispense Refill   ACCU-CHEK GUIDE test strip USE AS DIRECTED 100 strip 3   aspirin EC 81 MG tablet Take 81 mg by mouth as needed (for headaches).     blood glucose meter kit and supplies KIT Dispense based on patient and insurance preference. Check sugars once daily and as needed as directed. 1 each 0   clobetasol cream (TEMOVATE) 8.58 % Apply 1 application topically as needed (for skin irritation- to affected areas).      cyanocobalamin (,VITAMIN B-12,) 1000 MCG/ML injection Inject 1,000 mcg into the muscle every 30 (thirty) days.      diphenhydramine-acetaminophen (TYLENOL PM) 25-500 MG TABS tablet Take 1 tablet by mouth at bedtime as needed (for sleep).     flecainide (TAMBOCOR) 50 MG tablet Take 1 tablet (50 mg total) by mouth 2 (two) times daily. 180 tablet 3   hydrALAZINE (APRESOLINE) 10 MG tablet Take 1 tablet (10 mg total) by mouth 3 (three) times daily. 90 tablet 5   losartan (COZAAR) 100 MG tablet TAKE 1 TABLET BY MOUTH  DAILY 90 tablet 3   metFORMIN (GLUCOPHAGE-XR) 500 MG 24 hr tablet TAKE 1 TABLET BY MOUTH ONCE DAILY WITH  BREAKFAST (Patient taking differently: Take 500 mg by mouth daily with breakfast. ) 90 tablet 3   metoprolol succinate (TOPROL-XL) 50 MG 24 hr tablet Take 1 tablet (50 mg total) by mouth daily. Take with or immediately following a meal. 30 tablet 0   omeprazole (PRILOSEC) 40 MG capsule Take 1 capsule (40 mg total) by mouth daily. 30 capsule 5   tamoxifen (NOLVADEX) 20 MG tablet TAKE 1 TABLET BY MOUTH  DAILY 90 tablet 0   terconazole (TERAZOL 7) 0.4 % vaginal cream Place 1 applicator vaginally at bedtime.     No current facility-administered medications for this visit.     Past Medical History:  Diagnosis Date   Arthritis    left hip   Breast cancer, right ([place]) 11/2016   Family history of breast cancer    Family history of colon cancer    Family history of pancreatic cancer    GERD (gastroesophageal reflux disease)    Non-insulin dependent type 2 diabetes mellitus ([place])    PAC (premature atrial contraction)    Personal history of radiation therapy 12/2016   Premature ventricular contraction    Seasonal allergies    Sensitive skin    Vitamin B12 deficiency     Past Surgical History:  Procedure Laterality Date   ABDOMINAL HYSTERECTOMY  1979   partial   BREAST BIOPSY  07/02/2011   Procedure: BREAST BIOPSY WITH NEEDLE LOCALIZATION;  Surgeon: Shann Medal, MD;  Location: [facility];  Service: General;  Laterality: Left;  left breast atypical hyperplasia needle localization biopsy  BREAST BIOPSY Left 06/13/2006   BREAST BIOPSY Right 06/27/2008   BREAST BIOPSY Right 09/27/2016   BREAST EXCISIONAL BIOPSY Left 07/02/2011   BREAST LUMPECTOMY Left 07/04/2006   BREAST LUMPECTOMY Right 06/26/2008   BREAST LUMPECTOMY Right 09/27/2016   BREAST LUMPECTOMY WITH RADIOACTIVE SEED LOCALIZATION Right 11/18/2016   Procedure: RIGHT BREAST LUMPECTOMY WITH RADIOACTIVE SEED LOCALIZATION ERAS PATHWAY;  Surgeon: Alphonsa Overall, MD;  Location: [facility];  Service:  General;  Laterality: Right;  ERAS PATHWAY   CATARACT EXTRACTION Bilateral    CHOLECYSTECTOMY, LAPAROSCOPIC  07/07/2001   had appendex taking out at the same time   ESOPHAGEAL DILATION  2006   LAPAROSCOPIC APPENDECTOMY  07/07/2001   TONSILLECTOMY  1942    Social History   Socioeconomic History   Marital status: Divorced    Spouse name: Marcello Moores   Number of children: 1   Years of education: Not on file   Highest education level: Not on file  Occupational History   Occupation: retire  Tobacco Use   Smoking status: Never Smoker   Smokeless tobacco: Never Used  Scientific laboratory technician Use: Never used  Substance and Sexual Activity   Alcohol use: No    Alcohol/week: 0.0 standard drinks   Drug use: No   Sexual activity: Not Currently  Other Topics Concern   Not on file  Social History Narrative   Not on file   Social Determinants of Health   Financial Resource Strain: Low Risk    Difficulty of Paying Living Expenses: Not hard at all  Food Insecurity:    Worried About Charity fundraiser in the Last Year: Not on file   [place] in the Last Year: Not on file  Transportation Needs:    Lack of Transportation (Medical): Not on file   Lack of Transportation (Non-Medical): Not on file  Physical Activity:    Days of Exercise per Week: Not on file   Minutes of Exercise per Session: Not on file  Stress:    Feeling of Stress : Not on file  Social Connections:    Frequency of Communication with Friends and Family: Not on file   Frequency of Social Gatherings with Friends and Family: Not on file   Attends Religious Services: Not on file   Active Member of Clubs or Organizations: Not on file   Attends Archivist Meetings: Not on file   Marital Status: Not on file  Intimate Partner Violence:    Fear of Current or Ex-Partner: Not on file   Emotionally Abused: Not on file   Physically Abused: Not on file   Sexually Abused: Not on  file    Family History  Problem Relation Age of Onset   Colon cancer Mother 38   Heart attack Mother    Breast cancer Sister 59   Heart disease Maternal Uncle        CABG   Breast cancer Sister 47   Heart disease Brother    Other Father        suicide   Breast cancer Sister 34   Heart disease Sister    COPD Brother    Heart disease Brother    Breast cancer Other 27       niece - brother's daughter   Pancreatic cancer Other        brother's son, dx in his 63s    ROS: no fevers or chills, productive cough, hemoptysis, dysphasia, odynophagia, melena, hematochezia, dysuria, hematuria,  rash, seizure activity, orthopnea, PND, pedal edema, claudication. Remaining systems are negative.  Physical Exam: Well-developed well-nourished in no acute distress.  Skin is warm and dry.  HEENT is normal.  Neck is supple.  Chest is clear to auscultation with normal expansion.  Cardiovascular exam is regular rate and rhythm.  3/6 systolic murmur left sternal border. Abdominal exam nontender or distended. No masses palpated. Extremities show no edema. neuro grossly intact  ECG-sinus rhythm at a rate of 75, left anterior fascicular block, right bundle branch block, cannot rule out inferior infarct.  Personally reviewed  A/P  1 palpitations-previously felt to be secondary to PVCs.  She has having increased symptoms.  Increase Toprol to 75 mg daily and follow.  2 coronary calcification-continue aspirin.  Intolerant to statins.  3 carotid artery disease-mild on most recent Dopplers.  4 hypertension-patient's blood pressure is controlled.  Continue present medications and follow.  5 murmur-louder on examination.  Schedule echocardiogram to further assess.  Kirk Ruths, MD

## 2020-02-21 ENCOUNTER — Ambulatory Visit: Payer: Medicare Other | Admitting: Cardiology

## 2020-02-21 ENCOUNTER — Encounter: Payer: Self-pay | Admitting: Cardiology

## 2020-02-21 VITALS — BP 134/60 | HR 75 | Ht 63.0 in | Wt 127.0 lb

## 2020-02-21 DIAGNOSIS — I10 Essential (primary) hypertension: Secondary | ICD-10-CM | POA: Diagnosis not present

## 2020-02-21 DIAGNOSIS — R011 Cardiac murmur, unspecified: Secondary | ICD-10-CM | POA: Diagnosis not present

## 2020-02-21 DIAGNOSIS — E785 Hyperlipidemia, unspecified: Secondary | ICD-10-CM

## 2020-02-21 DIAGNOSIS — R002 Palpitations: Secondary | ICD-10-CM | POA: Diagnosis not present

## 2020-02-21 MED ORDER — METOPROLOL SUCCINATE ER 50 MG PO TB24: 75.0000 mg | ORAL_TABLET | Freq: Every day | ORAL | 3 refills | Status: DC

## 2020-02-21 NOTE — Patient Instructions (Signed)
Medication Instructions:   INCREASE METOPROLOL TO 75 MG ONCE DAILY=1 AND 1/2 OF THE 50 MG TABLETS ONCE DAILY  *If you need a refill on your cardiac medications before your next appointment, please call your pharmacy*   Testing/Procedures:  Your physician has requested that you have an echocardiogram. Echocardiography is a painless test that uses sound waves to create images of your heart. It provides your doctor with information about the size and shape of your heart and how well your heart's chambers and valves are working. This procedure takes approximately one hour. There are no restrictions for this procedure.[place]     Follow-Up: At [facility], you and your health needs are our priority.  As part of our continuing mission to provide you with exceptional heart care, we have created designated Provider Care Teams.  These Care Teams include your primary Cardiologist (physician) and Advanced Practice Providers (APPs -  Physician Assistants and Nurse Practitioners) who all work together to provide you with the care you need, when you need it.  We recommend signing up for the patient portal called "MyChart".  Sign up information is provided on this After Visit Summary.  MyChart is used to connect with patients for Virtual Visits (Telemedicine).  Patients are able to view lab/test results, encounter notes, upcoming appointments, etc.  Non-urgent messages can be sent to your provider as well.   To learn more about what you can do with MyChart, go to [email or web address].    Your next appointment:   6 month(s)  The format for your next appointment:   In Person  Provider:   Kirk Ruths, MD

## 2020-02-22 ENCOUNTER — Ambulatory Visit (INDEPENDENT_AMBULATORY_CARE_PROVIDER_SITE_OTHER): Payer: Medicare Other | Admitting: Otolaryngology

## 2020-02-22 ENCOUNTER — Other Ambulatory Visit: Payer: Self-pay

## 2020-02-22 ENCOUNTER — Encounter (INDEPENDENT_AMBULATORY_CARE_PROVIDER_SITE_OTHER): Payer: Self-pay | Admitting: Otolaryngology

## 2020-02-22 VITALS — Temp 97.7°F

## 2020-02-22 DIAGNOSIS — G44229 Chronic tension-type headache, not intractable: Secondary | ICD-10-CM

## 2020-02-22 DIAGNOSIS — H6123 Impacted cerumen, bilateral: Secondary | ICD-10-CM

## 2020-02-22 NOTE — Progress Notes (Signed)
HPI: Tami Jacobson is a 86 y.o. female who presents is referred by her PCP for evaluation of headaches.  She has had chronic problems with frontal headaches dating back to February.  She thought the headaches may be coming from her sinuses.  She has seen neurology concerning her headaches.  She describes the headaches in the frontal area and forehead.  She has had a couple of CT scans of her head that have showed clear paranasal sinuses.  I reviewed the last CT scan performed this past August and this showed clear paranasal sinuses.  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  She has had no drainage from her nose.  Past Medical History:  Diagnosis Date  . Arthritis    left hip  . Breast cancer, right ([place]) 11/2016  . Family history of breast cancer   . Family history of colon cancer   . Family history of pancreatic cancer   . GERD (gastroesophageal reflux disease)   . Non-insulin dependent type 2 diabetes mellitus ([place])   . PAC (premature atrial contraction)   . Personal history of radiation therapy 12/2016  . Premature ventricular contraction   . Seasonal allergies   . Sensitive skin   . Vitamin B12 deficiency    Past Surgical History:  Procedure Laterality Date  . ABDOMINAL HYSTERECTOMY  1979   partial  . BREAST BIOPSY  07/02/2011   Procedure: BREAST BIOPSY WITH NEEDLE LOCALIZATION;  Surgeon: Shann Medal, MD;  Location: [place];  Service: General;  Laterality: Left;  left breast atypical hyperplasia needle localization biopsy  . BREAST BIOPSY Left 06/13/2006  . BREAST BIOPSY Right 06/27/2008  . BREAST BIOPSY Right 09/27/2016  . BREAST EXCISIONAL BIOPSY Left 07/02/2011  . BREAST LUMPECTOMY Left 07/04/2006  . BREAST LUMPECTOMY Right 06/26/2008  . BREAST LUMPECTOMY Right 09/27/2016  . BREAST LUMPECTOMY WITH RADIOACTIVE SEED LOCALIZATION Right 11/18/2016   Procedure: RIGHT BREAST LUMPECTOMY WITH RADIOACTIVE SEED LOCALIZATION  ERAS PATHWAY;  Surgeon: Alphonsa Overall, MD;  Location: [place];  Service: General;  Laterality: Right;  ERAS PATHWAY  . CATARACT EXTRACTION Bilateral   . CHOLECYSTECTOMY, LAPAROSCOPIC  07/07/2001   had appendex taking out at the same time  . ESOPHAGEAL DILATION  2006  . LAPAROSCOPIC APPENDECTOMY  07/07/2001  . TONSILLECTOMY  1942   Social History   Socioeconomic History  . Marital status: Divorced    Spouse name: Marcello Moores  . Number of children: 1  . Years of education: Not on file  . Highest education level: Not on file  Occupational History  . Occupation: retire  Tobacco Use  . Smoking status: Never Smoker  . Smokeless tobacco: Never Used  Vaping Use  . Vaping Use: Never used  Substance and Sexual Activity  . Alcohol use: No    Alcohol/week: 0.0 standard drinks  . Drug use: No  . Sexual activity: Not Currently  Other Topics Concern  . Not on file  Social History Narrative  . Not on file   Social Determinants of Health   Financial Resource Strain: Low Risk   . Difficulty of Paying Living Expenses: Not hard at all  Food Insecurity:   . Worried About Charity fundraiser in the Last Year: Not on file  . Ran Out of Food in the Last Year: Not on file  Transportation Needs:   . Lack of Transportation (Medical): Not on file  . Lack of Transportation (Non-Medical): Not on  file  Physical Activity:   . Days of Exercise per Week: Not on file  . Minutes of Exercise per Session: Not on file  Stress:   . Feeling of Stress : Not on file  Social Connections:   . Frequency of Communication with Friends and Family: Not on file  . Frequency of Social Gatherings with Friends and Family: Not on file  . Attends Religious Services: Not on file  . Active Member of Clubs or Organizations: Not on file  . Attends Archivist Meetings: Not on file  . Marital Status: Not on file   Family History  Problem Relation Age of Onset  . Colon cancer Mother 51  . Heart  attack Mother   . Breast cancer Sister 16  . Heart disease Maternal Uncle        CABG  . Breast cancer Sister 40  . Heart disease Brother   . Other Father        suicide  . Breast cancer Sister 73  . Heart disease Sister   . COPD Brother   . Heart disease Brother   . Breast cancer Other 38       niece - brother's daughter  . Pancreatic cancer Other        brother's son, dx in his 50s   Allergies  Allergen Reactions  . Statins Other (See Comments)    GI UPSET  . Effexor [Venlafaxine]     Constipation, shaking, dizzy  . Cephalexin Other (See Comments)    Reaction not recalled by the patient  . Codeine Other (See Comments)    Reaction not recalled by the patient  . Lansoprazole Other (See Comments)    Reaction not recalled by the patient  . Latex Rash       . Levofloxacin Other (See Comments)    Reaction not recalled by the patient  . Nitrofurantoin Other (See Comments)    Reaction not recalled by the patient  . Rofecoxib Other (See Comments)    Reaction not recalled by the patient    Prior to Admission medications   Medication Sig Start Date End Date Taking? Authorizing Provider  ACCU-CHEK GUIDE test strip USE AS DIRECTED 01/22/19  Yes Burns, Claudina Lick, MD  aspirin EC 81 MG tablet Take 81 mg by mouth as needed (for headaches).   Yes [provider]  blood glucose meter kit and supplies KIT Dispense based on patient and insurance preference. Check sugars once daily and as needed as directed. 09/07/16  Yes Burns, Claudina Lick, MD  clobetasol cream (TEMOVATE) 5.63 % Apply 1 application topically as needed (for skin irritation- to affected areas).  06/21/11  Yes [provider]  cyanocobalamin (,VITAMIN B-12,) 1000 MCG/ML injection Inject 1,000 mcg into the muscle every 30 (thirty) days.    Yes [provider]  diphenhydramine-acetaminophen (TYLENOL PM) 25-500 MG TABS tablet Take 1 tablet by mouth at bedtime as needed (for sleep).   Yes [provider]  flecainide (TAMBOCOR) 50 MG tablet Take 1 tablet (50 mg total) by mouth 2 (two) times daily. 10/26/19  Yes Lelon Perla, MD  hydrALAZINE (APRESOLINE) 10 MG tablet Take 1 tablet (10 mg total) by mouth 3 (three) times daily. 02/07/20  Yes Burns, Claudina Lick, MD  losartan (COZAAR) 100 MG tablet TAKE 1 TABLET BY MOUTH  DAILY 10/30/19  Yes Burns, Claudina Lick, MD  metFORMIN (GLUCOPHAGE-XR) 500 MG 24 hr tablet TAKE 1 TABLET BY MOUTH ONCE DAILY WITH BREAKFAST  Patient taking differently: Take 500 mg by mouth daily with breakfast.  04/02/19  Yes Burns, Claudina Lick, MD  metoprolol succinate (TOPROL-XL) 50 MG 24 hr tablet Take 1.5 tablets (75 mg total) by mouth daily. Take with or immediately following a meal. 02/21/20  Yes Crenshaw, Denice Bors, MD  omeprazole (PRILOSEC) 40 MG capsule Take 1 capsule (40 mg total) by mouth daily. 11/29/19  Yes Esterwood, Amy S, PA-C  tamoxifen (NOLVADEX) 20 MG tablet TAKE 1 TABLET BY MOUTH  DAILY 12/26/19  Yes Magrinat, Virgie Dad, MD  terconazole (TERAZOL 7) 0.4 % vaginal cream Place 1 applicator vaginally at bedtime. 11/28/19  Yes [provider]     Positive ROS: Otherwise negative  All other systems have been reviewed and were otherwise negative with the exception of those mentioned in the HPI and as above.  Physical Exam: Constitutional: Alert, well-appearing, no acute distress Ears: External ears without lesions or tenderness.  She has some wax buildup in both ear canals that was cleaned in the office with forceps and curettes.  The TMs were clear bilaterally with good mobility pneumatic otoscopy. Nasal: External nose without lesions. Septum midline with mild rhinitis..  Both middle meatus regions are clear with no signs of infection.  Superior nasal cavity is clear. Oral: Lips and gums without lesions. Tongue and palate mucosa without lesions. Posterior oropharynx clear. Neck: No palpable adenopathy or masses Respiratory: Breathing comfortably  Skin: No facial/neck  lesions or rash noted.  Cerumen impaction removal  Date/Time: 02/22/2020 3:59 PM Performed by: Rozetta Nunnery, MD Authorized by: Rozetta Nunnery, MD   Consent:    Consent obtained:  Verbal   Consent given by:  Patient   Risks discussed:  Pain and bleeding Procedure details:    Location:  L ear and R ear   Procedure type: curette and forceps   Post-procedure details:    Inspection:  TM intact and canal normal   Hearing quality:  Improved   Patient tolerance of procedure:  Tolerated well, no immediate complications Comments:     TMs are clear bilaterally.    Assessment: Headaches  Plan: Reviewed with her that the headaches are not coming from her sinuses as she does not have significant frontal sinuses and the sinuses are otherwise clear on CT scan x2. Would recommend further evaluation with a neurologist or headache specialist.   Radene Journey, MD   CC:

## 2020-03-05 NOTE — Progress Notes (Signed)
Subjective:    Patient ID: Tami Jacobson, female    DOB: May 05, 1927, 86 y.o.   MRN: [id number]  HPI The patient is here for follow up of their chronic medical problems, including htn, B12 def.  We started hydralazine 10 mg three times daily.     BP better controlled at home.  occ BP systolic pressure in 272'Z, but most BPs at home well ocntroled.     Medications and allergies reviewed with patient and updated if appropriate.  Patient Active Problem List   Diagnosis Date Noted  . Mild memory disturbance 01/30/2020  . Palpitations 05/23/2019  . Chronic diastolic CHF (congestive heart failure) ([place]) 05/14/2019  . Hypertensive urgency 05/14/2019  . Mild tricuspid regurgitation 05/13/2019  . LVH (left ventricular hypertrophy), mild 05/13/2019  . Genetic testing 08/30/2017  . Depression 06/01/2017  . Family history of breast cancer   . Family history of colon cancer   . Family history of pancreatic cancer   . Abnormal electrocardiogram 05/05/2015  . BPPV (benign paroxysmal positional vertigo) 10/01/2014  . Murmur 04/18/2014  . Anal fissure 12/16/2011  . Fibrocystic breast changes. Left.  Biopsy 07/02/2011. 07/14/2011  . History of breast cancer, Right, T1c, N0, lumpectomy 06/26/2008.   Left, T1c, N0, lumpectomy 06/24/2006. 05/12/2011  . Diabetes mellitus type 2, controlled ([place]) 06/11/2009  . PREMATURE VENTRICULAR CONTRACTIONS 08/19/2008  . B12 deficiency 08/29/2007  . ESOPHAGEAL STRICTURE 04/27/2007  . GERD (gastroesophageal reflux disease) 04/27/2007  . DIVERTICULOSIS, COLON 04/27/2007  . OTHER ALOPECIA 02/14/2007  . Dyslipidemia 01/02/2007  . Essential hypertension 01/02/2007  . Carotid stenosis 08/22/2006    Current Outpatient Medications on File Prior to Visit  Medication Sig Dispense Refill  . ACCU-CHEK GUIDE test strip USE AS DIRECTED 100 strip 3  . aspirin EC 81 MG tablet Take 81 mg by mouth as needed (for headaches).    . blood glucose meter kit and supplies  KIT Dispense based on patient and insurance preference. Check sugars once daily and as needed as directed. 1 each 0  . clobetasol cream (TEMOVATE) 3.66 % Apply 1 application topically as needed (for skin irritation- to affected areas).     . cyanocobalamin (,VITAMIN B-12,) 1000 MCG/ML injection Inject 1,000 mcg into the muscle every 30 (thirty) days.     . diphenhydramine-acetaminophen (TYLENOL PM) 25-500 MG TABS tablet Take 1 tablet by mouth at bedtime as needed (for sleep).    . flecainide (TAMBOCOR) 50 MG tablet Take 1 tablet (50 mg total) by mouth 2 (two) times daily. 180 tablet 3  . hydrALAZINE (APRESOLINE) 10 MG tablet Take 1 tablet (10 mg total) by mouth 3 (three) times daily. 90 tablet 5  . losartan (COZAAR) 100 MG tablet TAKE 1 TABLET BY MOUTH  DAILY 90 tablet 3  . metFORMIN (GLUCOPHAGE-XR) 500 MG 24 hr tablet TAKE 1 TABLET BY MOUTH ONCE DAILY WITH BREAKFAST (Patient taking differently: Take 500 mg by mouth daily with breakfast. ) 90 tablet 3  . metoprolol succinate (TOPROL-XL) 50 MG 24 hr tablet Take 1.5 tablets (75 mg total) by mouth daily. Take with or immediately following a meal. 135 tablet 3  . omeprazole (PRILOSEC) 40 MG capsule Take 1 capsule (40 mg total) by mouth daily. 30 capsule 5  . tamoxifen (NOLVADEX) 20 MG tablet TAKE 1 TABLET BY MOUTH  DAILY 90 tablet 0  . terconazole (TERAZOL 7) 0.4 % vaginal cream Place 1 applicator vaginally at bedtime.     No current facility-administered medications on file  prior to visit.    Past Medical History:  Diagnosis Date  . Arthritis    left hip  . Breast cancer, right ([place]) 11/2016  . Family history of breast cancer   . Family history of colon cancer   . Family history of pancreatic cancer   . GERD (gastroesophageal reflux disease)   . Non-insulin dependent type 2 diabetes mellitus ([place])   . PAC (premature atrial contraction)   . Personal history of radiation therapy 12/2016  . Premature ventricular contraction   . Seasonal  allergies   . Sensitive skin   . Vitamin B12 deficiency     Past Surgical History:  Procedure Laterality Date  . ABDOMINAL HYSTERECTOMY  1979   partial  . BREAST BIOPSY  07/02/2011   Procedure: BREAST BIOPSY WITH NEEDLE LOCALIZATION;  Surgeon: Shann Medal, MD;  Location: [facility];  Service: General;  Laterality: Left;  left breast atypical hyperplasia needle localization biopsy  . BREAST BIOPSY Left 06/13/2006  . BREAST BIOPSY Right 06/27/2008  . BREAST BIOPSY Right 09/27/2016  . BREAST EXCISIONAL BIOPSY Left 07/02/2011  . BREAST LUMPECTOMY Left 07/04/2006  . BREAST LUMPECTOMY Right 06/26/2008  . BREAST LUMPECTOMY Right 09/27/2016  . BREAST LUMPECTOMY WITH RADIOACTIVE SEED LOCALIZATION Right 11/18/2016   Procedure: RIGHT BREAST LUMPECTOMY WITH RADIOACTIVE SEED LOCALIZATION ERAS PATHWAY;  Surgeon: Alphonsa Overall, MD;  Location: [facility];  Service: General;  Laterality: Right;  ERAS PATHWAY  . CATARACT EXTRACTION Bilateral   . CHOLECYSTECTOMY, LAPAROSCOPIC  07/07/2001   had appendex taking out at the same time  . ESOPHAGEAL DILATION  2006  . LAPAROSCOPIC APPENDECTOMY  07/07/2001  . TONSILLECTOMY  1942    Social History   Socioeconomic History  . Marital status: Divorced    Spouse name: Marcello Moores  . Number of children: 1  . Years of education: Not on file  . Highest education level: Not on file  Occupational History  . Occupation: retire  Tobacco Use  . Smoking status: Never Smoker  . Smokeless tobacco: Never Used  Vaping Use  . Vaping Use: Never used  Substance and Sexual Activity  . Alcohol use: No    Alcohol/week: 0.0 standard drinks  . Drug use: No  . Sexual activity: Not Currently  Other Topics Concern  . Not on file  Social History Narrative  . Not on file   Social Determinants of Health   Financial Resource Strain: Low Risk   . Difficulty of Paying Living Expenses: Not hard at all  Food Insecurity:   . Worried About Charity fundraiser in the  Last Year: Not on file  . Ran Out of Food in the Last Year: Not on file  Transportation Needs:   . Lack of Transportation (Medical): Not on file  . Lack of Transportation (Non-Medical): Not on file  Physical Activity:   . Days of Exercise per Week: Not on file  . Minutes of Exercise per Session: Not on file  Stress:   . Feeling of Stress : Not on file  Social Connections:   . Frequency of Communication with Friends and Family: Not on file  . Frequency of Social Gatherings with Friends and Family: Not on file  . Attends Religious Services: Not on file  . Active Member of Clubs or Organizations: Not on file  . Attends Archivist Meetings: Not on file  . Marital Status: Not on file    Family History  Problem Relation Age of Onset  .  Colon cancer Mother 28  . Heart attack Mother   . Breast cancer Sister 68  . Heart disease Maternal Uncle        CABG  . Breast cancer Sister 42  . Heart disease Brother   . Other Father        suicide  . Breast cancer Sister 48  . Heart disease Sister   . COPD Brother   . Heart disease Brother   . Breast cancer Other 82       niece - brother's daughter  . Pancreatic cancer Other        brother's son, dx in his 62s    Review of Systems  Cardiovascular: Negative for chest pain and palpitations.  Neurological: Negative for light-headedness and headaches.       Objective:   Vitals:   03/06/20 1154  BP: 136/78  Pulse: 67  Temp: 98.2 F (36.8 C)  SpO2: 97%   BP Readings from Last 3 Encounters:  03/06/20 136/78  02/21/20 134/60  02/07/20 (!) 168/78   Wt Readings from Last 3 Encounters:  03/06/20 130 lb (59 kg)  02/21/20 127 lb (57.6 kg)  02/07/20 127 lb (57.6 kg)   Body mass index is 23.03 kg/m.   Physical Exam    Constitutional: Appears well-developed and well-nourished. No distress.  HENT:  Head: Normocephalic and atraumatic.  Neck: Neck supple. No tracheal deviation present. No thyromegaly present.  No cervical  lymphadenopathy Cardiovascular: Normal rate, regular rhythm and normal heart sounds.   3/6 systolic murmur heard.  No edema Pulmonary/Chest: Effort normal and breath sounds normal. No respiratory distress. No has no wheezes. No rales.  Skin: Skin is warm and dry. Not diaphoretic.  Psychiatric: Normal mood and affect. Behavior is normal.      Assessment & Plan:    See Problem List for Assessment and Plan of chronic medical problems.    This visit occurred during the SARS-CoV-2 public health emergency.  Safety protocols were in place, including screening questions prior to the visit, additional usage of staff PPE, and extensive cleaning of exam room while observing appropriate contact time as indicated for disinfecting solutions.

## 2020-03-06 ENCOUNTER — Encounter: Payer: Self-pay | Admitting: Internal Medicine

## 2020-03-06 ENCOUNTER — Other Ambulatory Visit: Payer: Self-pay

## 2020-03-06 ENCOUNTER — Ambulatory Visit (INDEPENDENT_AMBULATORY_CARE_PROVIDER_SITE_OTHER): Payer: Medicare Other | Admitting: Internal Medicine

## 2020-03-06 DIAGNOSIS — E538 Deficiency of other specified B group vitamins: Secondary | ICD-10-CM | POA: Diagnosis not present

## 2020-03-06 DIAGNOSIS — I10 Essential (primary) hypertension: Secondary | ICD-10-CM

## 2020-03-06 DIAGNOSIS — E118 Type 2 diabetes mellitus with unspecified complications: Secondary | ICD-10-CM

## 2020-03-06 NOTE — Patient Instructions (Addendum)
Call [facility] to set up your covid booster - [phone]    B12 injection given today    Follow up in 6 months

## 2020-03-06 NOTE — Assessment & Plan Note (Addendum)
Chronic Controlled With GERD  Lab Results  Component Value Date   HGBA1C 6.7 (H) 02/07/2020   Continue metformin XR 500 mg daily

## 2020-03-06 NOTE — Assessment & Plan Note (Signed)
Chronic BP well controlled Continue hydralazine 10 mg TID, metoprolol XL 75 mg daily and losartan 100 mg daily

## 2020-03-06 NOTE — Assessment & Plan Note (Signed)
Chronic B12 monthly injections  - injection given today 1000 mcg IM Continue monthly B12 injections

## 2020-03-13 ENCOUNTER — Other Ambulatory Visit: Payer: Self-pay | Admitting: Oncology

## 2020-03-13 ENCOUNTER — Telehealth: Payer: Self-pay | Admitting: Pharmacist

## 2020-03-13 DIAGNOSIS — Z170 Estrogen receptor positive status [ER+]: Secondary | ICD-10-CM

## 2020-03-13 DIAGNOSIS — C50311 Malignant neoplasm of lower-inner quadrant of right female breast: Secondary | ICD-10-CM

## 2020-03-13 NOTE — Progress Notes (Signed)
Chronic Care Management Pharmacy Assistant   Name: Tami Jacobson  MRN: [id number] DOB: 12/27/1927  Reason for Encounter: Medication Adherence Call    PCP : Binnie Rail, MD  Allergies:   Allergies  Allergen Reactions   Statins Other (See Comments)    GI UPSET   Effexor [Venlafaxine]     Constipation, shaking, dizzy   Cephalexin Other (See Comments)    Reaction not recalled by the patient   Codeine Other (See Comments)    Reaction not recalled by the patient   Lansoprazole Other (See Comments)    Reaction not recalled by the patient   Latex Rash        Levofloxacin Other (See Comments)    Reaction not recalled by the patient   Nitrofurantoin Other (See Comments)    Reaction not recalled by the patient   Rofecoxib Other (See Comments)    Reaction not recalled by the patient     Medications: Outpatient Encounter Medications as of 03/13/2020  Medication Sig   ACCU-CHEK GUIDE test strip USE AS DIRECTED   aspirin EC 81 MG tablet Take 81 mg by mouth as needed (for headaches).   blood glucose meter kit and supplies KIT Dispense based on patient and insurance preference. Check sugars once daily and as needed as directed.   clobetasol cream (TEMOVATE) 1.70 % Apply 1 application topically as needed (for skin irritation- to affected areas).    cyanocobalamin (,VITAMIN B-12,) 1000 MCG/ML injection Inject 1,000 mcg into the muscle every 30 (thirty) days.    diphenhydramine-acetaminophen (TYLENOL PM) 25-500 MG TABS tablet Take 1 tablet by mouth at bedtime as needed (for sleep).   flecainide (TAMBOCOR) 50 MG tablet Take 1 tablet (50 mg total) by mouth 2 (two) times daily.   hydrALAZINE (APRESOLINE) 10 MG tablet Take 1 tablet (10 mg total) by mouth 3 (three) times daily.   losartan (COZAAR) 100 MG tablet TAKE 1 TABLET BY MOUTH  DAILY   metFORMIN (GLUCOPHAGE-XR) 500 MG 24 hr tablet TAKE 1 TABLET BY MOUTH ONCE DAILY WITH BREAKFAST (Patient taking differently:  Take 500 mg by mouth daily with breakfast. )   metoprolol succinate (TOPROL-XL) 50 MG 24 hr tablet Take 1.5 tablets (75 mg total) by mouth daily. Take with or immediately following a meal.   omeprazole (PRILOSEC) 40 MG capsule Take 1 capsule (40 mg total) by mouth daily.   tamoxifen (NOLVADEX) 20 MG tablet TAKE 1 TABLET BY MOUTH  DAILY   terconazole (TERAZOL 7) 0.4 % vaginal cream Place 1 applicator vaginally at bedtime.   No facility-administered encounter medications on file as of 03/13/2020.    Current Diagnosis: Patient Active Problem List   Diagnosis Date Noted   Mild memory disturbance 01/30/2020   Palpitations 05/23/2019   Chronic diastolic CHF (congestive heart failure) ([place]) 05/14/2019   Hypertensive urgency 05/14/2019   Mild tricuspid regurgitation 05/13/2019   LVH (left ventricular hypertrophy), mild 05/13/2019   Genetic testing 08/30/2017   Depression 06/01/2017   Family history of breast cancer    Family history of colon cancer    Family history of pancreatic cancer    Abnormal electrocardiogram 05/05/2015   BPPV (benign paroxysmal positional vertigo) 10/01/2014   Murmur 04/18/2014   Anal fissure 12/16/2011   Fibrocystic breast changes. Left.  Biopsy 07/02/2011. 07/14/2011   History of breast cancer, Right, T1c, N0, lumpectomy 06/26/2008.   Left, T1c, N0, lumpectomy 06/24/2006. 05/12/2011   Diabetes mellitus type 2, controlled ([place]) 06/11/2009   PREMATURE  VENTRICULAR CONTRACTIONS 08/19/2008   B12 deficiency 08/29/2007   ESOPHAGEAL STRICTURE 04/27/2007   GERD (gastroesophageal reflux disease) 04/27/2007   DIVERTICULOSIS, COLON 04/27/2007   OTHER ALOPECIA 02/14/2007   Dyslipidemia 01/02/2007   Essential hypertension 01/02/2007   Carotid stenosis 08/22/2006    Goals Addressed   None     Follow-Up:  Pharmacist Review    The patient was on the medication adherence List for her metformin 524m for a next fill date of 02/26/2020 for a  90-day supply. After speaking with the patient she stated that her medications did not get delivered until 03/10/2020 from [facility] she did get a 90-day supply.   DRosendo Gros [facility] Practice Team Manager/ CPA (Clinical Pharmacist Assistant) [phone]

## 2020-03-14 ENCOUNTER — Other Ambulatory Visit (HOSPITAL_COMMUNITY): Payer: Medicare Other

## 2020-03-18 DIAGNOSIS — H04523 Eversion of bilateral lacrimal punctum: Secondary | ICD-10-CM | POA: Diagnosis not present

## 2020-03-18 DIAGNOSIS — H02145 Spastic ectropion of left lower eyelid: Secondary | ICD-10-CM | POA: Diagnosis not present

## 2020-03-18 DIAGNOSIS — H02135 Senile ectropion of left lower eyelid: Secondary | ICD-10-CM | POA: Diagnosis not present

## 2020-03-18 DIAGNOSIS — H57813 Brow ptosis, bilateral: Secondary | ICD-10-CM | POA: Diagnosis not present

## 2020-03-18 DIAGNOSIS — H02115 Cicatricial ectropion of left lower eyelid: Secondary | ICD-10-CM | POA: Diagnosis not present

## 2020-03-18 DIAGNOSIS — H04223 Epiphora due to insufficient drainage, bilateral lacrimal glands: Secondary | ICD-10-CM | POA: Diagnosis not present

## 2020-03-18 DIAGNOSIS — H02112 Cicatricial ectropion of right lower eyelid: Secondary | ICD-10-CM | POA: Diagnosis not present

## 2020-03-18 DIAGNOSIS — H02132 Senile ectropion of right lower eyelid: Secondary | ICD-10-CM | POA: Diagnosis not present

## 2020-03-18 DIAGNOSIS — H16213 Exposure keratoconjunctivitis, bilateral: Secondary | ICD-10-CM | POA: Diagnosis not present

## 2020-03-18 DIAGNOSIS — H04123 Dry eye syndrome of bilateral lacrimal glands: Secondary | ICD-10-CM | POA: Diagnosis not present

## 2020-03-18 DIAGNOSIS — H04553 Acquired stenosis of bilateral nasolacrimal duct: Secondary | ICD-10-CM | POA: Diagnosis not present

## 2020-03-18 DIAGNOSIS — H02142 Spastic ectropion of right lower eyelid: Secondary | ICD-10-CM | POA: Diagnosis not present

## 2020-03-20 ENCOUNTER — Other Ambulatory Visit: Payer: Self-pay

## 2020-03-20 ENCOUNTER — Ambulatory Visit: Payer: Medicare Other | Admitting: Pharmacist

## 2020-03-20 ENCOUNTER — Ambulatory Visit (HOSPITAL_COMMUNITY): Payer: Medicare Other | Attending: Cardiology

## 2020-03-20 DIAGNOSIS — R011 Cardiac murmur, unspecified: Secondary | ICD-10-CM

## 2020-03-20 DIAGNOSIS — E118 Type 2 diabetes mellitus with unspecified complications: Secondary | ICD-10-CM

## 2020-03-20 DIAGNOSIS — I5032 Chronic diastolic (congestive) heart failure: Secondary | ICD-10-CM

## 2020-03-20 DIAGNOSIS — I10 Essential (primary) hypertension: Secondary | ICD-10-CM

## 2020-03-20 DIAGNOSIS — N1831 Chronic kidney disease, stage 3a: Secondary | ICD-10-CM

## 2020-03-20 LAB — ECHOCARDIOGRAM COMPLETE
Area-P 1/2: 1.89 cm2
S' Lateral: 2.3 cm

## 2020-03-20 NOTE — Chronic Care Management (AMB) (Signed)
Chronic Care Management Pharmacy  Name: Lasheba Stevens  MRN: [id number] DOB: April 23, 1927   Chief Complaint/ HPI  Tami Jacobson,  86 y.o. , female presents for their Follow-Up CCM visit with the clinical pharmacist via telephone due to COVID-19 Pandemic.  PCP : Binnie Rail, MD Patient Care Team: Binnie Rail, MD as PCP - General (Internal Medicine) Stanford Breed Denice Bors, MD as Consulting Physician (Cardiology) Magrinat, Virgie Dad, MD as Consulting Physician (Hematology and Oncology) Newton Pigg, MD as Consulting Physician (Obstetrics and Gynecology) Alphonsa Overall, MD as Consulting Physician (General Surgery) Rozetta Nunnery, MD as Consulting Physician (Otolaryngology) Kyung Rudd, MD as Consulting Physician (Radiation Oncology) Delice Bison, Charlestine Massed, NP as Nurse Practitioner (Hematology and Oncology) Luberta Mutter, MD as Consulting Physician (Ophthalmology) Charlton Haws, [facility] as Pharmacist (Pharmacist)  Their chronic conditions include: Hypertension, Hyperlipidemia, Diabetes, Heart Failure, GERD, Chronic Kidney Disease, Depression and Hx breast cancer   Office Visits: 03/06/20 Dr Quay Burow OV: BP improved. no med changes 02/07/20 Dr Quay Burow OV: BP elevated, added hydralazine 10 mg TID.  11/27/19 Dr Quay Burow OV: memory difficulty, referred to neurology. Increased omeprazole to 40 mg due to stomach issues.  11/05/19 Dr Quay Burow OV: chronic f/u. Home BP cuff (wrist) does not appear accurate. Headache x 3 wks, treat as sinus infection, ordered CT head (results negative), rx prednisone. Gave B12 shot.  10/29/19 Dr Quay Burow OV: acute visit for high BP at home, blurred vision. Start naproxen PRN, hydralazine 25 mg TID  08/13/19 Dr Quay Burow OV: chronic f/u, move 1 BP med to evening  Consult Visit: 11/09/19 Dr Lucia Gaskins (ENT): cerumen impaction removal 10/22/19 Dr Jana Hakim (heme/onc): breast ca dx 2008, recurrence 2018, no evidence of disease activity to date. Plan to stop  tamoxifen in Nov 2023. 08/21/19 Dr Stanford Breed (cardiology): palpitations 2/2 PVCs, continue flecainide and BB.  08/06/19 PA Amy Trellis Paganini (GI): f/u for GERD, hx esophageal stricture requring dilation 2009. Stable, no med changes. 05/14/19-05/16/19 Hospital admission: hypertensive urgency, losartan increased to 100 mg and Toprol continued. Advised 2g salt diet.  Allergies  Allergen Reactions  . Statins Other (See Comments)    GI UPSET  . Effexor [Venlafaxine]     Constipation, shaking, dizzy  . Cephalexin Other (See Comments)    Reaction not recalled by the patient  . Codeine Other (See Comments)    Reaction not recalled by the patient  . Lansoprazole Other (See Comments)    Reaction not recalled by the patient  . Latex Rash       . Levofloxacin Other (See Comments)    Reaction not recalled by the patient  . Nitrofurantoin Other (See Comments)    Reaction not recalled by the patient  . Rofecoxib Other (See Comments)    Reaction not recalled by the patient     Medications: Outpatient Encounter Medications as of 03/20/2020  Medication Sig  . ACCU-CHEK GUIDE test strip USE AS DIRECTED  . acetaminophen (TYLENOL) 500 MG tablet Take 500 mg by mouth every 6 (six) hours as needed.  . blood glucose meter kit and supplies KIT Dispense based on patient and insurance preference. Check sugars once daily and as needed as directed.  . clobetasol cream (TEMOVATE) 5.62 % Apply 1 application topically as needed (for skin irritation- to affected areas).   . cyanocobalamin (,VITAMIN B-12,) 1000 MCG/ML injection Inject 1,000 mcg into the muscle every 30 (thirty) days.  . flecainide (TAMBOCOR) 50 MG tablet Take 1 tablet (50 mg total) by mouth 2 (two) times daily.  Marland Kitchen  hydrALAZINE (APRESOLINE) 10 MG tablet Take 1 tablet (10 mg total) by mouth 3 (three) times daily.  Marland Kitchen losartan (COZAAR) 100 MG tablet TAKE 1 TABLET BY MOUTH  DAILY  . metFORMIN (GLUCOPHAGE-XR) 500 MG 24 hr tablet TAKE 1 TABLET BY MOUTH ONCE DAILY  WITH BREAKFAST (Patient taking differently: Take 500 mg by mouth daily with breakfast.)  . metoprolol succinate (TOPROL-XL) 50 MG 24 hr tablet Take 1.5 tablets (75 mg total) by mouth daily. Take with or immediately following a meal.  . omeprazole (PRILOSEC) 40 MG capsule Take 1 capsule (40 mg total) by mouth daily.  . tamoxifen (NOLVADEX) 20 MG tablet TAKE 1 TABLET BY MOUTH  DAILY  . terconazole (TERAZOL 7) 0.4 % vaginal cream Place 1 applicator vaginally at bedtime.  . [DISCONTINUED] aspirin EC 81 MG tablet Take 81 mg by mouth as needed (for headaches).  . [DISCONTINUED] diphenhydramine-acetaminophen (TYLENOL PM) 25-500 MG TABS tablet Take 1 tablet by mouth at bedtime as needed (for sleep).   No facility-administered encounter medications on file as of 03/20/2020.   Wt Readings from Last 3 Encounters:  03/06/20 130 lb (59 kg)  02/21/20 127 lb (57.6 kg)  02/07/20 127 lb (57.6 kg)   Lab Results  Component Value Date   CREATININE 0.96 02/07/2020   BUN 11 02/07/2020   GFR 52.16 (L) 02/07/2020   GFRNONAA 40 (L) 10/22/2019   GFRAA 46 (L) 10/22/2019   NA 137 02/07/2020   K 4.0 02/07/2020   CALCIUM 9.2 02/07/2020   CO2 30 02/07/2020    Current Diagnosis/Assessment:    Goals Addressed            This Visit's Progress   . Pharmacy Care Plan       CARE PLAN ENTRY (see longitudinal plan of care for additional care plan information)  Current Barriers:  . Chronic Disease Management support, education, and care coordination needs related to Hypertension, Diabetes, Heart Failure, and Chronic Kidney Disease   Hypertension / Heart failure BP Readings from Last 3 Encounters:  03/06/20 136/78  02/21/20 134/60  02/07/20 (!) 168/78 .  Pharmacist Clinical Goal(s): o Over the next 180 days, patient will work with PharmD and providers to maintain BP goal < 140/90 . Current regimen:  o Metoprolol succinate 50 mg daily o Losartan 100 mg daily o Hydralazine 10 mg 3 times  daily . Interventions: o Discussed BP goals and benefits of medication for prevention of heart attack / stroke . Patient self care activities - Over the next 180 days, patient will: o Check BP daily, document, and provide at future appointments o Ensure daily salt intake < 2300 mg/day  Diabetes Lab Results  Component Value Date/Time   HGBA1C 6.7 (H) 02/07/2020 09:03 AM   HGBA1C 6.7 (A) 08/13/2019 02:20 PM   HGBA1C 6.7 (H) 05/15/2019 05:31 AM .  Pharmacist Clinical Goal(s): o Over the next 180 days, patient will work with PharmD and providers to maintain A1c goal <7% . Current regimen:  o Metformin 500 mg daily . Interventions: o Discussed blood sugar goals and benefits of medication for prevention of diabetic complications o Discussed prednisone can cause high blood sugar, but sugars should return to normal once steroid course is completed . Patient self care activities - Over the next 180 days, patient will: o Check blood sugar once daily and in the morning before eating or drinking, document, and provide at future appointments o Contact provider with any episodes of hypoglycemia  Chronic kidney disease . Pharmacist Clinical  Goal(s) o Over the next 180 days, patient will work with PharmD and providers to optimize therapy . Current regimen:  o Losartan 100 mg daily . Interventions: o Discussed causes of kidney disease including aging, high blood pressure, and diabetes o Discussed importance of hydration . Patient self care activities - Over the next 180 days, patient will: o Drink at least 48 oz of water per day  Medication management . Pharmacist Clinical Goal(s): o Over the next 180 days, patient will work with PharmD and providers to maintain optimal medication adherence . Current pharmacy: [facility] . Interventions o Comprehensive medication review performed. o Continue current medication management strategy . Patient self care activities - Over the next 180  days, patient will: o Focus on medication adherence by fill date o Take medications as prescribed o Report any questions or concerns to PharmD and/or provider(s)  Please see past updates related to this goal by clicking on the "Past Updates" button in the selected goal        Heart Failure / Hypertension   Type: Diastolic Last ejection fraction 05/11/2019: 55-60%, mild LVH  BP goal is:  <150/90 BP Readings from Last 3 Encounters:  03/06/20 136/78  02/21/20 134/60  02/07/20 (!) 168/78   Checking BP: daily Recent BP readings: SBP 110-120s/60s  Patient has failed these meds in past: n/a Patient is currently uncontrolled on the following medications:  Marland Kitchen Metoprolol succinate 50 mg daily - AM . Losartan 100 mg daily - AM . Hydralazine 10 mg TID . Flecainide 50 mg BID   We discussed: BP goals; BP has improved since restarting hydralazine at 10 mg; pt denies issues  Plan  Continue current medications and control with diet and exercise  Hyperlipidemia   LDL goal < 100  Lipid Panel     Component Value Date/Time   CHOL 173 02/22/2019 1204   TRIG 157.0 (H) 02/22/2019 1204   HDL 50.40 02/22/2019 1204   LDLCALC 92 02/22/2019 1204   LDLDIRECT 128.3 12/26/2012 1003    Hepatic Function Latest Ref Rng & Units 02/07/2020 10/22/2019 07/20/2019  Total Protein 6.0 - 8.3 g/dL 6.4 6.3(L) 6.7  Albumin 3.5 - 5.2 g/dL 3.8 3.4(L) 4.0  AST 0 - 37 U/L '15 18 13  ' ALT 0 - 35 U/L '13 18 12  ' Alk Phosphatase 39 - 117 U/L 43 42 40  Total Bilirubin 0.2 - 1.2 mg/dL 0.5 0.6 0.7  Bilirubin, Direct 0.0 - 0.2 mg/dL - - -   The ASCVD Risk score Mikey Bussing DC Jr., et al., 2013) failed to calculate for the following reasons:   The 2013 ASCVD risk score is only valid for ages 56 to 8   Patient has failed these meds in past: statin-intolerant per notes; aspirin Patient is currently controlled on the following medications:  . No medications  We discussed:  Pt has aged out of primary prevention statin benefit  group; she has not taken aspirin for years  Plan  Continue current medications and control with diet and exercise  Diabetes   A1c goal <7%  Recent Relevant Labs: Lab Results  Component Value Date/Time   HGBA1C 6.7 (H) 02/07/2020 09:03 AM   HGBA1C 6.7 (A) 08/13/2019 02:20 PM   HGBA1C 6.7 (H) 05/15/2019 05:31 AM   GFR 52.16 (L) 02/07/2020 09:03 AM   GFR 55.29 (L) 07/20/2019 02:00 PM   MICROALBUR 1.0 08/23/2018 11:51 AM   MICROALBUR 0.8 09/21/2016 09:46 AM    Last diabetic Eye exam:  Lab Results  Component Value Date/Time   HMDIABEYEEXA No Retinopathy 10/04/2018 12:00 AM    Last diabetic Foot exam: No results found for: HMDIABFOOTEX   Checking BG: Daily Recent FBG Readings: 140s  Fish, okra, green beans, unsweet tea Eats a lot of vegetables  Patient has failed these meds in past: n/a Patient is currently controlled on the following medications: Marland Kitchen Metformin ER 500 mg daily - AM  We discussed: Discussed importance of maintaining sugars at goal to prevent complications of diabetes including kidney damage, retinal damage, and cardiovascular disease   Plan  Continue current medications and control with diet and exercise  Medication Management   Pt uses [facility] mail order pharmacy for all medications Uses pill box? Yes Pt endorses 100% compliance  We discussed: Pt reports all medications are free through mail order; she is satisfied with pharmacy services  Plan  Continue current medication management strategy    Follow up: 6 month phone visit  Charlene Brooke, PharmD, BCACP Clinical Pharmacist [facility] at [facility] [phone]

## 2020-03-20 NOTE — Patient Instructions (Signed)
Visit Information  Phone number for Pharmacist: [phone]  Goals Addressed            This Visit's Progress   . Pharmacy Care Plan       CARE PLAN ENTRY (see longitudinal plan of care for additional care plan information)  Current Barriers:  . Chronic Disease Management support, education, and care coordination needs related to Hypertension, Diabetes, Heart Failure, and Chronic Kidney Disease   Hypertension / Heart failure BP Readings from Last 3 Encounters:  03/06/20 136/78  02/21/20 134/60  02/07/20 (!) 168/78 .  Pharmacist Clinical Goal(s): o Over the next 180 days, patient will work with PharmD and providers to maintain BP goal < 140/90 . Current regimen:  o Metoprolol succinate 50 mg daily o Losartan 100 mg daily o Hydralazine 10 mg 3 times daily . Interventions: o Discussed BP goals and benefits of medication for prevention of heart attack / stroke . Patient self care activities - Over the next 180 days, patient will: o Check BP daily, document, and provide at future appointments o Ensure daily salt intake < 2300 mg/day  Diabetes Lab Results  Component Value Date/Time   HGBA1C 6.7 (H) 02/07/2020 09:03 AM   HGBA1C 6.7 (A) 08/13/2019 02:20 PM   HGBA1C 6.7 (H) 05/15/2019 05:31 AM .  Pharmacist Clinical Goal(s): o Over the next 180 days, patient will work with PharmD and providers to maintain A1c goal <7% . Current regimen:  o Metformin 500 mg daily . Interventions: o Discussed blood sugar goals and benefits of medication for prevention of diabetic complications o Discussed prednisone can cause high blood sugar, but sugars should return to normal once steroid course is completed . Patient self care activities - Over the next 180 days, patient will: o Check blood sugar once daily and in the morning before eating or drinking, document, and provide at future appointments o Contact provider with any episodes of hypoglycemia  Chronic kidney disease . Pharmacist  Clinical Goal(s) o Over the next 180 days, patient will work with PharmD and providers to optimize therapy . Current regimen:  o Losartan 100 mg daily . Interventions: o Discussed causes of kidney disease including aging, high blood pressure, and diabetes o Discussed importance of hydration . Patient self care activities - Over the next 180 days, patient will: o Drink at least 48 oz of water per day  Medication management . Pharmacist Clinical Goal(s): o Over the next 180 days, patient will work with PharmD and providers to maintain optimal medication adherence . Current pharmacy: [facility] . Interventions o Comprehensive medication review performed. o Continue current medication management strategy . Patient self care activities - Over the next 180 days, patient will: o Focus on medication adherence by fill date o Take medications as prescribed o Report any questions or concerns to PharmD and/or provider(s)  Please see past updates related to this goal by clicking on the "Past Updates" button in the selected goal       The patient verbalized understanding of instructions, educational materials, and care plan provided today and declined offer to receive copy of patient instructions, educational materials, and care plan.  Telephone follow up appointment with pharmacy team member scheduled for: 6 months  Charlene Brooke, PharmD, [facility] Clinical Pharmacist [facility] at [facility] [phone]

## 2020-03-24 ENCOUNTER — Encounter: Payer: Self-pay | Admitting: *Deleted

## 2020-04-04 DIAGNOSIS — N904 Leukoplakia of vulva: Secondary | ICD-10-CM | POA: Insufficient documentation

## 2020-04-04 DIAGNOSIS — H269 Unspecified cataract: Secondary | ICD-10-CM | POA: Insufficient documentation

## 2020-04-04 DIAGNOSIS — L900 Lichen sclerosus et atrophicus: Secondary | ICD-10-CM | POA: Insufficient documentation

## 2020-04-04 NOTE — Progress Notes (Signed)
Subjective:    Patient ID: Tami Jacobson, female    DOB: 10/05/1927, 86 y.o.   MRN: [id number]  HPI The patient is here for an acute visit.   She is here to discuss eye surgery.    She is due for a B12 injection.    Medications and allergies reviewed with patient and updated if appropriate.  Patient Active Problem List   Diagnosis Date Noted  . Mild memory disturbance 01/30/2020  . Palpitations 05/23/2019  . Chronic diastolic CHF (congestive heart failure) ([place]) 05/14/2019  . Hypertensive urgency 05/14/2019  . Mild tricuspid regurgitation 05/13/2019  . LVH (left ventricular hypertrophy), mild 05/13/2019  . Genetic testing 08/30/2017  . Depression 06/01/2017  . Family history of breast cancer   . Family history of colon cancer   . Family history of pancreatic cancer   . Abnormal electrocardiogram 05/05/2015  . BPPV (benign paroxysmal positional vertigo) 10/01/2014  . Murmur 04/18/2014  . Anal fissure 12/16/2011  . Fibrocystic breast changes. Left.  Biopsy 07/02/2011. 07/14/2011  . History of breast cancer, Right, T1c, N0, lumpectomy 06/26/2008.   Left, T1c, N0, lumpectomy 06/24/2006. 05/12/2011  . Diabetes mellitus type 2, controlled ([place]) 06/11/2009  . PREMATURE VENTRICULAR CONTRACTIONS 08/19/2008  . B12 deficiency 08/29/2007  . ESOPHAGEAL STRICTURE 04/27/2007  . GERD (gastroesophageal reflux disease) 04/27/2007  . DIVERTICULOSIS, COLON 04/27/2007  . OTHER ALOPECIA 02/14/2007  . Dyslipidemia 01/02/2007  . Essential hypertension 01/02/2007  . Carotid stenosis 08/22/2006    Current Outpatient Medications on File Prior to Visit  Medication Sig Dispense Refill  . ACCU-CHEK GUIDE test strip USE AS DIRECTED 100 strip 3  . acetaminophen (TYLENOL) 500 MG tablet Take 500 mg by mouth every 6 (six) hours as needed.    . blood glucose meter kit and supplies KIT Dispense based on patient and insurance preference. Check sugars once daily and as needed as directed. 1 each  0  . clobetasol cream (TEMOVATE) 0.25 % Apply 1 application topically as needed (for skin irritation- to affected areas).     . cyanocobalamin (,VITAMIN B-12,) 1000 MCG/ML injection Inject 1,000 mcg into the muscle every 30 (thirty) days.    . flecainide (TAMBOCOR) 50 MG tablet Take 1 tablet (50 mg total) by mouth 2 (two) times daily. 180 tablet 3  . hydrALAZINE (APRESOLINE) 10 MG tablet Take 1 tablet (10 mg total) by mouth 3 (three) times daily. 90 tablet 5  . losartan (COZAAR) 100 MG tablet TAKE 1 TABLET BY MOUTH  DAILY 90 tablet 3  . metFORMIN (GLUCOPHAGE-XR) 500 MG 24 hr tablet TAKE 1 TABLET BY MOUTH ONCE DAILY WITH BREAKFAST (Patient taking differently: Take 500 mg by mouth daily with breakfast.) 90 tablet 3  . metoprolol succinate (TOPROL-XL) 50 MG 24 hr tablet Take 1.5 tablets (75 mg total) by mouth daily. Take with or immediately following a meal. 135 tablet 3  . omeprazole (PRILOSEC) 40 MG capsule Take 1 capsule (40 mg total) by mouth daily. 30 capsule 5  . tamoxifen (NOLVADEX) 20 MG tablet TAKE 1 TABLET BY MOUTH  DAILY 90 tablet 3  . terconazole (TERAZOL 7) 0.4 % vaginal cream Place 1 applicator vaginally at bedtime.     No current facility-administered medications on file prior to visit.    Past Medical History:  Diagnosis Date  . Arthritis    left hip  . Breast cancer, right ([place]) 11/2016  . Family history of breast cancer   . Family history of colon cancer   .  Family history of pancreatic cancer   . GERD (gastroesophageal reflux disease)   . Non-insulin dependent type 2 diabetes mellitus ([place])   . PAC (premature atrial contraction)   . Personal history of radiation therapy 12/2016  . Premature ventricular contraction   . Seasonal allergies   . Sensitive skin   . Vitamin B12 deficiency     Past Surgical History:  Procedure Laterality Date  . ABDOMINAL HYSTERECTOMY  1979   partial  . BREAST BIOPSY  07/02/2011   Procedure: BREAST BIOPSY WITH NEEDLE LOCALIZATION;   Surgeon: Shann Medal, MD;  Location: [facility];  Service: General;  Laterality: Left;  left breast atypical hyperplasia needle localization biopsy  . BREAST BIOPSY Left 06/13/2006  . BREAST BIOPSY Right 06/27/2008  . BREAST BIOPSY Right 09/27/2016  . BREAST EXCISIONAL BIOPSY Left 07/02/2011  . BREAST LUMPECTOMY Left 07/04/2006  . BREAST LUMPECTOMY Right 06/26/2008  . BREAST LUMPECTOMY Right 09/27/2016  . BREAST LUMPECTOMY WITH RADIOACTIVE SEED LOCALIZATION Right 11/18/2016   Procedure: RIGHT BREAST LUMPECTOMY WITH RADIOACTIVE SEED LOCALIZATION ERAS PATHWAY;  Surgeon: Alphonsa Overall, MD;  Location: [facility];  Service: General;  Laterality: Right;  ERAS PATHWAY  . CATARACT EXTRACTION Bilateral   . CHOLECYSTECTOMY, LAPAROSCOPIC  07/07/2001   had appendex taking out at the same time  . ESOPHAGEAL DILATION  2006  . LAPAROSCOPIC APPENDECTOMY  07/07/2001  . TONSILLECTOMY  1942    Social History   Socioeconomic History  . Marital status: Divorced    Spouse name: Marcello Moores  . Number of children: 1  . Years of education: Not on file  . Highest education level: Not on file  Occupational History  . Occupation: retire  Tobacco Use  . Smoking status: Never Smoker  . Smokeless tobacco: Never Used  Vaping Use  . Vaping Use: Never used  Substance and Sexual Activity  . Alcohol use: No    Alcohol/week: 0.0 standard drinks  . Drug use: No  . Sexual activity: Not Currently  Other Topics Concern  . Not on file  Social History Narrative  . Not on file   Social Determinants of Health   Financial Resource Strain: Low Risk   . Difficulty of Paying Living Expenses: Not hard at all  Food Insecurity: Not on file  Transportation Needs: Not on file  Physical Activity: Not on file  Stress: Not on file  Social Connections: Not on file    Family History  Problem Relation Age of Onset  . Colon cancer Mother 15  . Heart attack Mother   . Breast cancer Sister 14  . Heart disease  Maternal Uncle        CABG  . Breast cancer Sister 46  . Heart disease Brother   . Other Father        suicide  . Breast cancer Sister 58  . Heart disease Sister   . COPD Brother   . Heart disease Brother   . Breast cancer Other 65       niece - brother's daughter  . Pancreatic cancer Other        brother's son, dx in his 11s    Review of Systems     Objective:  There were no vitals filed for this visit. BP Readings from Last 3 Encounters:  03/06/20 136/78  02/21/20 134/60  02/07/20 (!) 168/78   Wt Readings from Last 3 Encounters:  03/06/20 130 lb (59 kg)  02/21/20 127 lb (57.6 kg)  02/07/20 127 lb (  57.6 kg)   There is no height or weight on file to calculate BMI.   Physical Exam         Assessment & Plan:    See Problem List for Assessment and Plan of chronic medical problems.    This visit occurred during the SARS-CoV-2 public health emergency.  Safety protocols were in place, including screening questions prior to the visit, additional usage of staff PPE, and extensive cleaning of exam room while observing appropriate contact time as indicated for disinfecting solutions.   This encounter was created in error - please disregard.

## 2020-04-04 NOTE — Patient Instructions (Signed)
  Blood work was ordered.     Medications changes include :     Your prescription(s) have been submitted to your pharmacy. Please take as directed and contact our office if you believe you are having problem(s) with the medication(s).   A referral was ordered for        Someone from their office will call you to schedule an appointment.    Please followup in June as scheduled

## 2020-04-07 ENCOUNTER — Encounter: Payer: Medicare Other | Admitting: Internal Medicine

## 2020-04-07 DIAGNOSIS — E538 Deficiency of other specified B group vitamins: Secondary | ICD-10-CM

## 2020-04-09 NOTE — Progress Notes (Signed)
Subjective:    Patient ID: Tami Jacobson, female    DOB: [birth year], 87 y.o.   MRN: [id number]  HPI The patient is here for an acute visit.   She is here to discuss eye surgery.    She is due for a B12 injection.   BP has been high the past couple of weeks.    178/76, 1478/71,  170/72,  147/78.  She is taking the hydralazine 2-3 times a day - sometimes forgets the mid-day dose.  She takes her other meds daily.      Medications and allergies reviewed with patient and updated if appropriate.  Patient Active Problem List   Diagnosis Date Noted  . Lichen sclerosus et atrophicus of the vulva 04/04/2020  . Cataract 04/04/2020  . Lichen sclerosus et atrophicus 04/04/2020  . Mild memory disturbance 01/30/2020  . Palpitations 05/23/2019  . Chronic diastolic CHF (congestive heart failure) ([place]) 05/14/2019  . Hypertensive urgency 05/14/2019  . Mild tricuspid regurgitation 05/13/2019  . LVH (left ventricular hypertrophy), mild 05/13/2019  . Genetic testing 08/30/2017  . Depression 06/01/2017  . Family history of breast cancer   . Family history of colon cancer   . Family history of pancreatic cancer   . Abnormal electrocardiogram 05/05/2015  . BPPV (benign paroxysmal positional vertigo) 10/01/2014  . Murmur 04/18/2014  . Anal fissure 12/16/2011  . Fibrocystic breast changes. Left.  Biopsy 07/02/2011. 07/14/2011  . History of breast cancer, Right, T1c, N0, lumpectomy 06/26/2008.   Left, T1c, N0, lumpectomy 06/24/2006. 05/12/2011  . Diabetes mellitus type 2, controlled ([place]) 06/11/2009  . PREMATURE VENTRICULAR CONTRACTIONS 08/19/2008  . B12 deficiency 08/29/2007  . ESOPHAGEAL STRICTURE 04/27/2007  . GERD (gastroesophageal reflux disease) 04/27/2007  . DIVERTICULOSIS, COLON 04/27/2007  . OTHER ALOPECIA 02/14/2007  . Dyslipidemia 01/02/2007  . Essential hypertension 01/02/2007  . Carotid stenosis 08/22/2006    Current Outpatient Medications on File Prior to Visit   Medication Sig Dispense Refill  . ACCU-CHEK GUIDE test strip USE AS DIRECTED 100 strip 3  . acetaminophen (TYLENOL) 500 MG tablet Take 500 mg by mouth every 6 (six) hours as needed.    . blood glucose meter kit and supplies KIT Dispense based on patient and insurance preference. Check sugars once daily and as needed as directed. 1 each 0  . clobetasol cream (TEMOVATE) 3.79 % Apply 1 application topically as needed (for skin irritation- to affected areas).     . cyanocobalamin (,VITAMIN B-12,) 1000 MCG/ML injection Inject 1,000 mcg into the muscle every 30 (thirty) days.    . flecainide (TAMBOCOR) 50 MG tablet Take 1 tablet (50 mg total) by mouth 2 (two) times daily. 180 tablet 3  . hydrALAZINE (APRESOLINE) 10 MG tablet Take 1 tablet (10 mg total) by mouth 3 (three) times daily. 90 tablet 5  . losartan (COZAAR) 100 MG tablet TAKE 1 TABLET BY MOUTH  DAILY 90 tablet 3  . metFORMIN (GLUCOPHAGE-XR) 500 MG 24 hr tablet TAKE 1 TABLET BY MOUTH ONCE DAILY WITH BREAKFAST (Patient taking differently: Take 500 mg by mouth daily with breakfast.) 90 tablet 3  . metoprolol succinate (TOPROL-XL) 50 MG 24 hr tablet Take 1.5 tablets (75 mg total) by mouth daily. Take with or immediately following a meal. 135 tablet 3  . omeprazole (PRILOSEC) 40 MG capsule Take 1 capsule (40 mg total) by mouth daily. 30 capsule 5  . tamoxifen (NOLVADEX) 20 MG tablet TAKE 1 TABLET BY MOUTH  DAILY 90 tablet 3  . terconazole (  TERAZOL 7) 0.4 % vaginal cream Place 1 applicator vaginally at bedtime.     No current facility-administered medications on file prior to visit.    Past Medical History:  Diagnosis Date  . Arthritis    left hip  . Breast cancer, right ([place]) 11/2016  . Family history of breast cancer   . Family history of colon cancer   . Family history of pancreatic cancer   . GERD (gastroesophageal reflux disease)   . Non-insulin dependent type 2 diabetes mellitus ([place])   . PAC (premature atrial contraction)   .  Personal history of radiation therapy 12/2016  . Premature ventricular contraction   . Seasonal allergies   . Sensitive skin   . Vitamin B12 deficiency     Past Surgical History:  Procedure Laterality Date  . ABDOMINAL HYSTERECTOMY  1979   partial  . BREAST BIOPSY  07/02/2011   Procedure: BREAST BIOPSY WITH NEEDLE LOCALIZATION;  Surgeon: Shann Medal, MD;  Location: [facility];  Service: General;  Laterality: Left;  left breast atypical hyperplasia needle localization biopsy  . BREAST BIOPSY Left 06/13/2006  . BREAST BIOPSY Right 06/27/2008  . BREAST BIOPSY Right 09/27/2016  . BREAST EXCISIONAL BIOPSY Left 07/02/2011  . BREAST LUMPECTOMY Left 07/04/2006  . BREAST LUMPECTOMY Right 06/26/2008  . BREAST LUMPECTOMY Right 09/27/2016  . BREAST LUMPECTOMY WITH RADIOACTIVE SEED LOCALIZATION Right 11/18/2016   Procedure: RIGHT BREAST LUMPECTOMY WITH RADIOACTIVE SEED LOCALIZATION ERAS PATHWAY;  Surgeon: Alphonsa Overall, MD;  Location: [facility];  Service: General;  Laterality: Right;  ERAS PATHWAY  . CATARACT EXTRACTION Bilateral   . CHOLECYSTECTOMY, LAPAROSCOPIC  07/07/2001   had appendex taking out at the same time  . ESOPHAGEAL DILATION  2006  . LAPAROSCOPIC APPENDECTOMY  07/07/2001  . TONSILLECTOMY  1942    Social History   Socioeconomic History  . Marital status: Divorced    Spouse name: Marcello Moores  . Number of children: 1  . Years of education: Not on file  . Highest education level: Not on file  Occupational History  . Occupation: retire  Tobacco Use  . Smoking status: Never Smoker  . Smokeless tobacco: Never Used  Vaping Use  . Vaping Use: Never used  Substance and Sexual Activity  . Alcohol use: No    Alcohol/week: 0.0 standard drinks  . Drug use: No  . Sexual activity: Not Currently  Other Topics Concern  . Not on file  Social History Narrative  . Not on file   Social Determinants of Health   Financial Resource Strain: Low Risk   . Difficulty of Paying  Living Expenses: Not hard at all  Food Insecurity: Not on file  Transportation Needs: Not on file  Physical Activity: Not on file  Stress: Not on file  Social Connections: Not on file    Family History  Problem Relation Age of Onset  . Colon cancer Mother 67  . Heart attack Mother   . Breast cancer Sister 64  . Heart disease Maternal Uncle        CABG  . Breast cancer Sister 56  . Heart disease Brother   . Other Father        suicide  . Breast cancer Sister 31  . Heart disease Sister   . COPD Brother   . Heart disease Brother   . Breast cancer Other 5       niece - brother's daughter  . Pancreatic cancer Other  brother's son, dx in his 43s    Review of Systems  Constitutional: Negative for fever.  Respiratory: Negative for cough, shortness of breath and wheezing.   Cardiovascular: Negative for chest pain, palpitations and leg swelling.  Neurological: Positive for headaches (sinus).       Objective:   Vitals:   04/10/20 1101  BP: (!) 156/78  Pulse: 68  Temp: 98 F (36.7 C)  SpO2: 96%   BP Readings from Last 3 Encounters:  04/10/20 (!) 156/78  03/06/20 136/78  02/21/20 134/60   Wt Readings from Last 3 Encounters:  04/10/20 129 lb (58.5 kg)  03/06/20 130 lb (59 kg)  02/21/20 127 lb (57.6 kg)   Body mass index is 22.85 kg/m.   Physical Exam    Constitutional: Appears well-developed and well-nourished. No distress.  Head: Normocephalic and atraumatic.  Cardiovascular: Normal rate, regular rhythm and normal heart sounds.  2/6 sys murmur heard. No carotid bruit .  No edema Pulmonary/Chest: Effort normal and breath sounds normal. No respiratory distress. No has no wheezes. No rales.  Skin: Skin is warm and dry. Not diaphoretic.  Psychiatric: Normal mood and affect. Behavior is normal.       Assessment & Plan:    See Problem List for Assessment and Plan of chronic medical problems.    This visit occurred during the SARS-CoV-2 public health  emergency.  Safety protocols were in place, including screening questions prior to the visit, additional usage of staff PPE, and extensive cleaning of exam room while observing appropriate contact time as indicated for disinfecting solutions.

## 2020-04-10 ENCOUNTER — Other Ambulatory Visit: Payer: Self-pay

## 2020-04-10 ENCOUNTER — Ambulatory Visit (INDEPENDENT_AMBULATORY_CARE_PROVIDER_SITE_OTHER): Payer: Medicare Other | Admitting: Internal Medicine

## 2020-04-10 ENCOUNTER — Encounter: Payer: Self-pay | Admitting: Internal Medicine

## 2020-04-10 VITALS — BP 156/78 | HR 68 | Temp 98.0°F | Ht 63.0 in | Wt 129.0 lb

## 2020-04-10 DIAGNOSIS — E538 Deficiency of other specified B group vitamins: Secondary | ICD-10-CM

## 2020-04-10 DIAGNOSIS — I10 Essential (primary) hypertension: Secondary | ICD-10-CM | POA: Diagnosis not present

## 2020-04-10 MED ORDER — HYDRALAZINE HCL 25 MG PO TABS: 25.0000 mg | ORAL_TABLET | Freq: Three times a day (TID) | ORAL | 5 refills | Status: DC

## 2020-04-10 NOTE — Assessment & Plan Note (Signed)
Chronic BP not controlled - SBP often in 170's at home Continue losartan 100 mg daily, metoprolol xl 75 mg daily Increase hydralazine to 25 mg three times a day Continue to monitor BP at home

## 2020-04-10 NOTE — Patient Instructions (Addendum)
  Your had a B12 injection today.   Medications changes include :   Increase hydralazine to 25 mg three times a day   Your prescription(s) have been submitted to your pharmacy. Please take as directed and contact our office if you believe you are having problem(s) with the medication(s).

## 2020-04-10 NOTE — Assessment & Plan Note (Signed)
Chronic B12 injection today Continue B12 injections monthly

## 2020-04-11 MED ORDER — CYANOCOBALAMIN 1000 MCG/ML IJ SOLN
1000.0000 ug | Freq: Once | INTRAMUSCULAR | Status: AC
  Administered 2020-04-10: 1000 ug via INTRAMUSCULAR

## 2020-04-11 NOTE — Addendum Note (Signed)
Addended by: Marcina Millard on: 04/11/2020 11:21 AM   Modules accepted: Orders

## 2020-04-21 ENCOUNTER — Ambulatory Visit: Payer: Medicare Other

## 2020-04-25 ENCOUNTER — Ambulatory Visit: Payer: Medicare Other

## 2020-05-06 ENCOUNTER — Ambulatory Visit (INDEPENDENT_AMBULATORY_CARE_PROVIDER_SITE_OTHER): Payer: Medicare Other

## 2020-05-06 ENCOUNTER — Other Ambulatory Visit: Payer: Self-pay

## 2020-05-06 VITALS — BP 138/70 | HR 59 | Temp 98.2°F | Resp 16 | Ht 63.0 in | Wt 130.6 lb

## 2020-05-06 DIAGNOSIS — Z0000 Encounter for general adult medical examination without abnormal findings: Secondary | ICD-10-CM | POA: Diagnosis not present

## 2020-05-06 NOTE — Progress Notes (Signed)
Subjective:   Tami Jacobson is a 87 y.o. female who presents for Medicare Annual (Subsequent) preventive examination.  Review of Systems    No ROS. Medicare Wellness Visit. Additional risk factors are reflected in social history. Cardiac Risk Factors include: hypertension;family history of premature cardiovascular disease;dyslipidemia;diabetes mellitus;advanced age (>30mn, >>22women)     Objective:    Today's Vitals   05/06/20 1114  BP: 138/70  Pulse: (!) 59  Resp: 16  Temp: 98.2 F (36.8 C)  SpO2: 98%  Weight: 130 lb 9.6 oz (59.2 kg)  Height: '5\' 3"'  (1.6 m)  PainSc: 0-No pain   Body mass index is 23.13 kg/m.  Advanced Directives 05/06/2020 05/15/2019 09/28/2018 09/22/2017 02/28/2017 12/02/2016 12/02/2016  Does Patient Have a Medical Advance Directive? Yes No Yes Yes Yes No Yes  Type of AParamedicof [place]Living will - H[place] ShoreLiving will HFord[place]Living will H[place]il[place]Living will - -  Does patient want to make changes to medical advance directive? No - Patient declined - Yes (ED - Information included in AVS) - - - -  Copy of [place] Lake[place] Chart? No - copy requested - No - copy requested No - copy requested - - -  Would patient like information on creating a medical advance directive? - No - Patient declined - - - - -  Pre-existing out of facility DNR order (yellow form or pink MOST form) - - - - - - -    Current Medications (verified) Outpatient Encounter Medications as of 05/06/2020  Medication Sig  . ACCU-CHEK GUIDE test strip USE AS DIRECTED  . acetaminophen (TYLENOL) 500 MG tablet Take 500 mg by mouth every 6 (six) hours as needed.  . blood glucose meter kit and supplies KIT Dispense based on patient and insurance preference. Check sugars once daily and as needed as directed.  . clobetasol cream (TEMOVATE) 01.27% Apply 1 application topically as needed (for skin  irritation- to affected areas).   . cyanocobalamin (,VITAMIN B-12,) 1000 MCG/ML injection Inject 1,000 mcg into the muscle every 30 (thirty) days.  . flecainide (TAMBOCOR) 50 MG tablet Take 1 tablet (50 mg total) by mouth 2 (two) times daily.  . hydrALAZINE (APRESOLINE) 25 MG tablet Take 1 tablet (25 mg total) by mouth 3 (three) times daily.  .Marland Kitchenlosartan (COZAAR) 100 MG tablet TAKE 1 TABLET BY MOUTH  DAILY  . metFORMIN (GLUCOPHAGE-XR) 500 MG 24 hr tablet TAKE 1 TABLET BY MOUTH ONCE DAILY WITH BREAKFAST (Patient taking differently: Take 500 mg by mouth daily with breakfast.)  . metoprolol succinate (TOPROL-XL) 50 MG 24 hr tablet Take 1.5 tablets (75 mg total) by mouth daily. Take with or immediately following a meal.  . omeprazole (PRILOSEC) 40 MG capsule Take 1 capsule (40 mg total) by mouth daily.  . tamoxifen (NOLVADEX) 20 MG tablet TAKE 1 TABLET BY MOUTH  DAILY  . terconazole (TERAZOL 7) 0.4 % vaginal cream Place 1 applicator vaginally at bedtime.   No facility-administered encounter medications on file as of 05/06/2020.    Allergies (verified) Statins, Effexor [venlafaxine], Cephalexin, Codeine, Lansoprazole, Latex, Levofloxacin, Nitrofurantoin, and Rofecoxib   History: Past Medical History:  Diagnosis Date  . Arthritis    left hip  . Breast cancer, right ([place] 11/2016  . Family history of breast cancer   . Family history of colon cancer   . Family history of pancreatic cancer   . GERD (gastroesophageal reflux disease)   .  Non-insulin dependent type 2 diabetes mellitus ([place])   . PAC (premature atrial contraction)   . Personal history of radiation therapy 12/2016  . Premature ventricular contraction   . Seasonal allergies   . Sensitive skin   . Vitamin B12 deficiency    Past Surgical History:  Procedure Laterality Date  . ABDOMINAL HYSTERECTOMY  1979   partial  . BREAST BIOPSY  07/02/2011   Procedure: BREAST BIOPSY WITH NEEDLE LOCALIZATION;  Surgeon: Shann Medal, MD;   Location: [facility];  Service: General;  Laterality: Left;  left breast atypical hyperplasia needle localization biopsy  . BREAST BIOPSY Left 06/13/2006  . BREAST BIOPSY Right 06/27/2008  . BREAST BIOPSY Right 09/27/2016  . BREAST EXCISIONAL BIOPSY Left 07/02/2011  . BREAST LUMPECTOMY Left 07/04/2006  . BREAST LUMPECTOMY Right 06/26/2008  . BREAST LUMPECTOMY Right 09/27/2016  . BREAST LUMPECTOMY WITH RADIOACTIVE SEED LOCALIZATION Right 11/18/2016   Procedure: RIGHT BREAST LUMPECTOMY WITH RADIOACTIVE SEED LOCALIZATION ERAS PATHWAY;  Surgeon: Alphonsa Overall, MD;  Location: [facility];  Service: General;  Laterality: Right;  ERAS PATHWAY  . CATARACT EXTRACTION Bilateral   . CHOLECYSTECTOMY, LAPAROSCOPIC  07/07/2001   had appendex taking out at the same time  . ESOPHAGEAL DILATION  2006  . LAPAROSCOPIC APPENDECTOMY  07/07/2001  . TONSILLECTOMY  1942   Family History  Problem Relation Age of Onset  . Colon cancer Mother 59  . Heart attack Mother   . Breast cancer Sister 25  . Heart disease Maternal Uncle        CABG  . Breast cancer Sister 40  . Heart disease Brother   . Other Father        suicide  . Breast cancer Sister 19  . Heart disease Sister   . COPD Brother   . Heart disease Brother   . Breast cancer Other 59       niece - brother's daughter  . Pancreatic cancer Other        brother's son, dx in his 49s   Social History   Socioeconomic History  . Marital status: Divorced    Spouse name: Marcello Moores  . Number of children: 1  . Years of education: Not on file  . Highest education level: Not on file  Occupational History  . Occupation: retire  Tobacco Use  . Smoking status: Never Smoker  . Smokeless tobacco: Never Used  Vaping Use  . Vaping Use: Never used  Substance and Sexual Activity  . Alcohol use: No    Alcohol/week: 0.0 standard drinks  . Drug use: No  . Sexual activity: Not Currently  Other Topics Concern  . Not on file  Social History Narrative   . Not on file   Social Determinants of Health   Financial Resource Strain: Low Risk   . Difficulty of Paying Living Expenses: Not hard at all  Food Insecurity: No Food Insecurity  . Worried About Charity fundraiser in the Last Year: Never true  . Ran Out of Food in the Last Year: Never true  Transportation Needs: No Transportation Needs  . Lack of Transportation (Medical): No  . Lack of Transportation (Non-Medical): No  Physical Activity: Inactive  . Days of Exercise per Week: 0 days  . Minutes of Exercise per Session: 0 min  Stress: No Stress Concern Present  . Feeling of Stress : Not at all  Social Connections: Not on file    Tobacco Counseling Counseling given: Not Answered   Clinical  Intake:  Pre-visit preparation completed: Yes  Pain : No/denies pain Pain Score: 0-No pain     BMI - recorded: 23.13 Nutritional Status: BMI of 19-24  Normal Nutritional Risks: None Diabetes: Yes CBG done?: No Did pt. bring in CBG monitor from home?: No  How often do you need to have someone help you when you read instructions, pamphlets, or other written materials from your doctor or pharmacy?: 1 - Never What is the last grade level you completed in school?: High School Diploma  Diabetic? yes  Interpreter Needed?: No  Information entered by :: Lisette Abu, LPN   Activities of Daily Living In your present state of health, do you have any difficulty performing the following activities: 05/06/2020 05/15/2019  Hearing? Y Y  Comment problems with hearing from the left ear -  Vision? N N  Difficulty concentrating or making decisions? Y N  Comment more forgetfull -  Walking or climbing stairs? Y Y  Dressing or bathing? N N  Doing errands, shopping? N N  Preparing Food and eating ? N -  Using the Toilet? N -  In the past six months, have you accidently leaked urine? N -  Do you have problems with loss of bowel control? N -  Managing your Medications? N -  Managing your  Finances? N -  Housekeeping or managing your Housekeeping? N -  Some recent data might be hidden    Patient Care Team: Binnie Rail, MD as PCP - General (Internal Medicine) Stanford Breed Denice Bors, MD as Consulting Physician (Cardiology) Magrinat, Virgie Dad, MD as Consulting Physician (Hematology and Oncology) Newton Pigg, MD as Consulting Physician (Obstetrics and Gynecology) Alphonsa Overall, MD as Consulting Physician (General Surgery) Rozetta Nunnery, MD as Consulting Physician (Otolaryngology) Kyung Rudd, MD as Consulting Physician (Radiation Oncology) Delice Bison Charlestine Massed, NP as Nurse Practitioner (Hematology and Oncology) Luberta Mutter, MD as Consulting Physician (Ophthalmology) Charlton Haws, [facility] as Pharmacist (Pharmacist)  Indicate any recent Medical Services you may have received from other than [facility] providers in the past year (date may be approximate).     Assessment:   This is a routine wellness examination for Jenetta.  Hearing/Vision screen No exam data present  Dietary issues and exercise activities discussed: Current Exercise Habits: The patient does not participate in regular exercise at present, Exercise limited by: cardiac condition(s)  Goals    . Client understands the importance of follow-up with providers by attending scheduled visits     My goal is to rejoin the [employer] to get more physically active and I would love to join [employer].    Nilda Simmer current health status     Continue to eat healthy, stay active and independent as much as possible    . Patient Stated     I want to continue to do the crossword puzzle and read as much as possible. I will stay socially active by going to church and participating in some of my churches' activity.     . Pharmacy Care Plan     CARE PLAN ENTRY (see longitudinal plan of care for additional care plan information)  Current Barriers:  . Chronic Disease Management support, education, and care  coordination needs related to Hypertension, Diabetes, Heart Failure, and Chronic Kidney Disease   Hypertension / Heart failure BP Readings from Last 3 Encounters:  03/06/20 136/78  02/21/20 134/60  02/07/20 (!) 168/78 .  Pharmacist Clinical Goal(s): o Over the next 180 days, patient will work with PharmD  and providers to maintain BP goal < 140/90 . Current regimen:  o Metoprolol succinate 50 mg daily o Losartan 100 mg daily o Hydralazine 10 mg 3 times daily . Interventions: o Discussed BP goals and benefits of medication for prevention of heart attack / stroke . Patient self care activities - Over the next 180 days, patient will: o Check BP daily, document, and provide at future appointments o Ensure daily salt intake < 2300 mg/day  Diabetes Lab Results  Component Value Date/Time   HGBA1C 6.7 (H) 02/07/2020 09:03 AM   HGBA1C 6.7 (A) 08/13/2019 02:20 PM   HGBA1C 6.7 (H) 05/15/2019 05:31 AM .  Pharmacist Clinical Goal(s): o Over the next 180 days, patient will work with PharmD and providers to maintain A1c goal <7% . Current regimen:  o Metformin 500 mg daily . Interventions: o Discussed blood sugar goals and benefits of medication for prevention of diabetic complications o Discussed prednisone can cause high blood sugar, but sugars should return to normal once steroid course is completed . Patient self care activities - Over the next 180 days, patient will: o Check blood sugar once daily and in the morning before eating or drinking, document, and provide at future appointments o Contact provider with any episodes of hypoglycemia  Chronic kidney disease . Pharmacist Clinical Goal(s) o Over the next 180 days, patient will work with PharmD and providers to optimize therapy . Current regimen:  o Losartan 100 mg daily . Interventions: o Discussed causes of kidney disease including aging, high blood pressure, and diabetes o Discussed importance of hydration . Patient self care  activities - Over the next 180 days, patient will: o Drink at least 48 oz of water per day  Medication management . Pharmacist Clinical Goal(s): o Over the next 180 days, patient will work with PharmD and providers to maintain optimal medication adherence . Current pharmacy: [facility] . Interventions o Comprehensive medication review performed. o Continue current medication management strategy . Patient self care activities - Over the next 180 days, patient will: o Focus on medication adherence by fill date o Take medications as prescribed o Report any questions or concerns to PharmD and/or provider(s)  Please see past updates related to this goal by clicking on the "Past Updates" button in the selected goal       Depression Screen PHQ 2/9 Scores 05/06/2020 02/07/2020 10/03/2018 09/28/2018 09/22/2017 02/28/2017 09/27/2016  PHQ - 2 Score 0 '1 4 4 1 1 ' 0  PHQ- 9 Score - '3 10 10 3 ' - -    Fall Risk Fall Risk  05/06/2020 01/30/2020 11/27/2019 09/28/2018 09/22/2017  Falls in the past year? 1 0 0 0 No  Number falls in past yr: 0 - - 0 -  Injury with Fall? 0 - - - -  Risk for fall due to : No Fall Risks - - Impaired balance/gait -  Follow up - - - Falls prevention discussed -    FALL RISK PREVENTION PERTAINING TO THE HOME:  Any stairs in or around the home? No  If so, are there any without handrails? No  Home free of loose throw rugs in walkways, pet beds, electrical cords, etc? Yes  Adequate lighting in your home to reduce risk of falls? Yes   ASSISTIVE DEVICES UTILIZED TO PREVENT FALLS:  Life alert? No  Use of a cane, walker or w/c? Yes  Grab bars in the bathroom? Yes  Shower chair or bench in shower? Yes  Elevated toilet seat  or a handicapped toilet? No   TIMED UP AND GO:  Was the test performed? No .  Length of time to ambulate 10 feet: 0 sec.   Gait steady and fast with assistive device  Cognitive Function: MMSE - Mini Mental State Exam 01/30/2020 09/22/2017 09/27/2016   Not completed: - - Refused  Orientation to time 5 5 -  Orientation to Place 5 5 -  Registration 3 3 -  Attention/ Calculation 5 5 -  Recall 3 3 -  Language- name 2 objects 2 2 -  Language- repeat 1 1 -  Language- follow 3 step command 2 3 -  Language- read & follow direction 0 1 -  Write a sentence 1 1 -  Copy design 0 1 -  Total score 27 30 -        Immunizations Immunization History  Administered Date(s) Administered  . Fluad Quad(high Dose 65+) 01/04/2019, 01/07/2020  . Influenza Split 12/14/2011  . Influenza Whole 02/01/2007, 01/03/2009, 12/15/2010  . Influenza, High Dose Seasonal PF 01/01/2014, 12/25/2014, 12/15/2015, 12/24/2016, 12/27/2017  . Influenza,inj,Quad PF,6+ Mos 12/26/2012  . Moderna Sars-Covid-2 Vaccination 06/03/2019, 06/30/2019, 03/07/2020  . Pneumococcal Conjugate-13 07/02/2014  . Pneumococcal Polysaccharide-23 02/16/2002, 02/09/2012  . Td 06/10/2009    TDAP status: Due, Education has been provided regarding the importance of this vaccine. Advised may receive this vaccine at local pharmacy or Health Dept. Aware to provide a copy of the vaccination record if obtained from local pharmacy or Health Dept. Verbalized acceptance and understanding.  Flu Vaccine status: Up to date  Pneumococcal vaccine status: Up to date  Covid-19 vaccine status: Completed vaccines  Qualifies for Shingles Vaccine? Yes   Zostavax completed No   Shingrix Completed?: No.    Education has been provided regarding the importance of this vaccine. Patient has been advised to call insurance company to determine out of pocket expense if they have not yet received this vaccine. Advised may also receive vaccine at local pharmacy or Health Dept. Verbalized acceptance and understanding.  Screening Tests Health Maintenance  Topic Date Due  . FOOT EXAM  09/28/2019  . OPHTHALMOLOGY EXAM  10/04/2019  . TETANUS/TDAP  08/12/2020 (Originally 06/11/2019)  . COVID-19 Vaccine (4 - Booster for  Moderna series) 09/05/2020  . INFLUENZA VACCINE  Completed  . DEXA SCAN  Completed  . PNA vac Low Risk Adult  Completed    Health Maintenance  Health Maintenance Due  Topic Date Due  . FOOT EXAM  09/28/2019  . OPHTHALMOLOGY EXAM  10/04/2019    Colorectal cancer screening: No longer required.   Mammogram status: Completed 09/10/2019. Repeat every year  Bone Density status: Completed 08/10/2010. Results reflect: Bone density results: OSTEOPENIA. Repeat every 2 years.  Lung Cancer Screening: (Low Dose CT Chest recommended if Age 45-80 years, 30 pack-year currently smoking OR have quit w/in 15years.) does not qualify.   Lung Cancer Screening Referral: no  Additional Screening:  Hepatitis C Screening: does not qualify; Completed no  Vision Screening: Recommended annual ophthalmology exams for early detection of glaucoma and other disorders of the eye. Is the patient up to date with their annual eye exam?  Yes  Who is the provider or what is the name of the office in which the patient attends annual eye exams? Luberta Mutter, MD. If pt is not established with a provider, would they like to be referred to a provider to establish care? No .   Dental Screening: Recommended annual dental exams for proper oral hygiene  Community Resource Referral / Chronic Care Management: CRR required this visit?  No   CCM required this visit?  No      Plan:     I have personally reviewed and noted the following in the patient's chart:   . Medical and social history . Use of alcohol, tobacco or illicit drugs  . Current medications and supplements . Functional ability and status . Nutritional status . Physical activity . Advanced directives . List of other physicians . Hospitalizations, surgeries, and ER visits in previous 12 months . Vitals . Screenings to include cognitive, depression, and falls . Referrals and appointments  In addition, I have reviewed and discussed with patient  certain preventive protocols, quality metrics, and best practice recommendations. A written personalized care plan for preventive services as well as general preventive health recommendations were provided to patient.     Sheral Flow, LPN   [id number]/12/9276   Nurse Notes: n/a

## 2020-05-06 NOTE — Patient Instructions (Addendum)
Tami Jacobson , Thank you for taking time to come for your Medicare Wellness Visit. I appreciate your ongoing commitment to your health goals. Please review the following plan we discussed and let me know if I can assist you in the future.   Screening recommendations/referrals: Colonoscopy: no repeat due to age 87: 09/10/2019 Bone Density: 08/10/2010 Recommended yearly ophthalmology/optometry visit for glaucoma screening and checkup Recommended yearly dental visit for hygiene and checkup  Vaccinations: Influenza vaccine: 01/07/2020 Pneumococcal vaccine: up to date Tdap vaccine: 06/10/2009; overdue Shingles vaccine: never done Covid-19: need second Moderna   Advanced directives: Please bring a copy of your health care power of attorney and living will to the office at your convenience.  Conditions/risks identified: Yes; Reviewed health maintenance screenings with patient today and relevant education, vaccines, and/or referrals were provided. Please continue to do your personal lifestyle choices by: daily care of teeth and gums, regular physical activity (goal should be 5 days a week for 30 minutes), eat a healthy diet, avoid tobacco and drug use, limiting any alcohol intake, taking a low-dose aspirin (if not allergic or have been advised by your provider otherwise) and taking vitamins and minerals as recommended by your provider. Continue doing brain stimulating activities (puzzles, reading, adult coloring books, staying active) to keep memory sharp. Continue to eat heart healthy diet (full of fruits, vegetables, whole grains, lean protein, water--limit salt, fat, and sugar intake) and increase physical activity as tolerated.  Next appointment: Please schedule your next Medicare Wellness Visit with your Nurse Health Advisor in 1 year by calling [phone].  Preventive Care 36 Years and Older, Female Preventive care refers to lifestyle choices and visits with your health care provider that can  promote health and wellness. What does preventive care include?  A yearly physical exam. This is also called an annual well check.  Dental exams once or twice a year.  Routine eye exams. Ask your health care provider how often you should have your eyes checked.  Personal lifestyle choices, including:  Daily care of your teeth and gums.  Regular physical activity.  Eating a healthy diet.  Avoiding tobacco and drug use.  Limiting alcohol use.  Practicing safe sex.  Taking low-dose aspirin every day.  Taking vitamin and mineral supplements as recommended by your health care provider. What happens during an annual well check? The services and screenings done by your health care provider during your annual well check will depend on your age, overall health, lifestyle risk factors, and family history of disease. Counseling  Your health care provider may ask you questions about your:  Alcohol use.  Tobacco use.  Drug use.  Emotional well-being.  Home and relationship well-being.  Sexual activity.  Eating habits.  History of falls.  Memory and ability to understand (cognition).  Work and work Statistician.  Reproductive health. Screening  You may have the following tests or measurements:  Height, weight, and BMI.  Blood pressure.  Lipid and cholesterol levels. These may be checked every 5 years, or more frequently if you are over 20 years old.  Skin check.  Lung cancer screening. You may have this screening every year starting at age 74 if you have a 30-pack-year history of smoking and currently smoke or have quit within the past 15 years.  Fecal occult blood test (FOBT) of the stool. You may have this test every year starting at age 10.  Flexible sigmoidoscopy or colonoscopy. You may have a sigmoidoscopy every 5 years or a colonoscopy every  10 years starting at age 82.  Hepatitis C blood test.  Hepatitis B blood test.  Sexually transmitted disease  (STD) testing.  Diabetes screening. This is done by checking your blood sugar (glucose) after you have not eaten for a while (fasting). You may have this done every 1-3 years.  Bone density scan. This is done to screen for osteoporosis. You may have this done starting at age 16.  Mammogram. This may be done every 1-2 years. Talk to your health care provider about how often you should have regular mammograms. Talk with your health care provider about your test results, treatment options, and if necessary, the need for more tests. Vaccines  Your health care provider may recommend certain vaccines, such as:  Influenza vaccine. This is recommended every year.  Tetanus, diphtheria, and acellular pertussis (Tdap, Td) vaccine. You may need a Td booster every 10 years.  Zoster vaccine. You may need this after age 43.  Pneumococcal 13-valent conjugate (PCV13) vaccine. One dose is recommended after age 57.  Pneumococcal polysaccharide (PPSV23) vaccine. One dose is recommended after age 83. Talk to your health care provider about which screenings and vaccines you need and how often you need them. This information is not intended to replace advice given to you by your health care provider. Make sure you discuss any questions you have with your health care provider. Document Released: 04/18/2015 Document Revised: 12/10/2015 Document Reviewed: 01/21/2015 Elsevier Interactive Patient Education  2017 [employer] Prevention in the Home Falls can cause injuries. They can happen to people of all ages. There are many things you can do to make your home safe and to help prevent falls. What can I do on the outside of my home?  Regularly fix the edges of walkways and driveways and fix any cracks.  Remove anything that might make you trip as you walk through a door, such as a raised step or threshold.  Trim any bushes or trees on the path to your home.  Use bright outdoor lighting.  Clear any  walking paths of anything that might make someone trip, such as rocks or tools.  Regularly check to see if handrails are loose or broken. Make sure that both sides of any steps have handrails.  Any raised decks and porches should have guardrails on the edges.  Have any leaves, snow, or ice cleared regularly.  Use sand or salt on walking paths during winter.  Clean up any spills in your garage right away. This includes oil or grease spills. What can I do in the bathroom?  Use night lights.  Install grab bars by the toilet and in the tub and shower. Do not use towel bars as grab bars.  Use non-skid mats or decals in the tub or shower.  If you need to sit down in the shower, use a plastic, non-slip stool.  Keep the floor dry. Clean up any water that spills on the floor as soon as it happens.  Remove soap buildup in the tub or shower regularly.  Attach bath mats securely with double-sided non-slip rug tape.  Do not have throw rugs and other things on the floor that can make you trip. What can I do in the bedroom?  Use night lights.  Make sure that you have a light by your bed that is easy to reach.  Do not use any sheets or blankets that are too big for your bed. They should not hang down onto the floor.  Have a firm chair that has side arms. You can use this for support while you get dressed.  Do not have throw rugs and other things on the floor that can make you trip. What can I do in the kitchen?  Clean up any spills right away.  Avoid walking on wet floors.  Keep items that you use a lot in easy-to-reach places.  If you need to reach something above you, use a strong step stool that has a grab bar.  Keep electrical cords out of the way.  Do not use floor polish or wax that makes floors slippery. If you must use wax, use non-skid floor wax.  Do not have throw rugs and other things on the floor that can make you trip. What can I do with my stairs?  Do not leave  any items on the stairs.  Make sure that there are handrails on both sides of the stairs and use them. Fix handrails that are broken or loose. Make sure that handrails are as long as the stairways.  Check any carpeting to make sure that it is firmly attached to the stairs. Fix any carpet that is loose or worn.  Avoid having throw rugs at the top or bottom of the stairs. If you do have throw rugs, attach them to the floor with carpet tape.  Make sure that you have a light switch at the top of the stairs and the bottom of the stairs. If you do not have them, ask someone to add them for you. What else can I do to help prevent falls?  Wear shoes that:  Do not have high heels.  Have rubber bottoms.  Are comfortable and fit you well.  Are closed at the toe. Do not wear sandals.  If you use a stepladder:  Make sure that it is fully opened. Do not climb a closed stepladder.  Make sure that both sides of the stepladder are locked into place.  Ask someone to hold it for you, if possible.  Clearly mark and make sure that you can see:  Any grab bars or handrails.  First and last steps.  Where the edge of each step is.  Use tools that help you move around (mobility aids) if they are needed. These include:  Canes.  Walkers.  Scooters.  Crutches.  Turn on the lights when you go into a dark area. Replace any light bulbs as soon as they burn out.  Set up your furniture so you have a clear path. Avoid moving your furniture around.  If any of your floors are uneven, fix them.  If there are any pets around you, be aware of where they are.  Review your medicines with your doctor. Some medicines can make you feel dizzy. This can increase your chance of falling. Ask your doctor what other things that you can do to help prevent falls. This information is not intended to replace advice given to you by your health care provider. Make sure you discuss any questions you have with your  health care provider. Document Released: 01/16/2009 Document Revised: 08/28/2015 Document Reviewed: 04/26/2014 Elsevier Interactive Patient Education  2017 [employer].

## 2020-05-12 ENCOUNTER — Other Ambulatory Visit: Payer: Self-pay

## 2020-05-12 DIAGNOSIS — R002 Palpitations: Secondary | ICD-10-CM

## 2020-05-12 MED ORDER — METOPROLOL SUCCINATE ER 50 MG PO TB24: 75.0000 mg | ORAL_TABLET | Freq: Every day | ORAL | 3 refills | Status: DC

## 2020-05-13 ENCOUNTER — Other Ambulatory Visit: Payer: Self-pay

## 2020-05-14 ENCOUNTER — Telehealth: Payer: Self-pay

## 2020-05-14 ENCOUNTER — Ambulatory Visit (INDEPENDENT_AMBULATORY_CARE_PROVIDER_SITE_OTHER): Payer: Medicare Other

## 2020-05-14 DIAGNOSIS — E538 Deficiency of other specified B group vitamins: Secondary | ICD-10-CM

## 2020-05-14 MED ORDER — CYANOCOBALAMIN 1000 MCG/ML IJ SOLN
1000.0000 ug | INTRAMUSCULAR | Status: AC
  Administered 2020-05-14 – 2021-04-27 (×8): 1000 ug via INTRAMUSCULAR

## 2020-05-14 NOTE — Progress Notes (Signed)
Pt here for monthly B12 injection per Dr Quay Burow. B12 105mcg given IM left deltoid and pt tolerated injection well.  Pt to schedule next B12 injection upon check out.

## 2020-05-14 NOTE — Telephone Encounter (Signed)
Has diabetes-well controlled.  Continue checking every 2-3 days.  Not sure if she needs a new prescription for lancets or a new meter with supplies-let me know

## 2020-05-14 NOTE — Telephone Encounter (Signed)
Both pending - where does she want them sent?

## 2020-05-14 NOTE — Telephone Encounter (Signed)
Pt states she checks her BS q2-3 days & needs more lancets.  May need new prescription for blood glucose meter/kit.  Will clarify with PCP if refill needed & contact the pt.

## 2020-05-14 NOTE — Addendum Note (Signed)
Addended by: Binnie Rail on: 05/14/2020 08:39 PM   Modules accepted: Orders

## 2020-05-15 MED ORDER — ACCU-CHEK SOFTCLIX LANCETS MISC: 4 refills | Status: DC

## 2020-05-15 MED ORDER — ACCU-CHEK GUIDE VI STRP: ORAL_STRIP | 4 refills | Status: DC

## 2020-05-15 NOTE — Addendum Note (Signed)
Addended by: Marcina Millard on: 05/15/2020 07:39 AM   Modules accepted: Orders

## 2020-06-03 ENCOUNTER — Telehealth: Payer: Self-pay | Admitting: Pharmacist

## 2020-06-03 NOTE — Progress Notes (Signed)
Chronic Care Management Pharmacy Assistant   Name: Eliora Nienhuis  MRN: [id number] DOB: [birth year]  Reason for Encounter: Diabetic Adherence Call   PCP : Binnie Rail, MD  Allergies:   Allergies  Allergen Reactions  . Statins Other (See Comments)    GI UPSET  . Effexor [Venlafaxine]     Constipation, shaking, dizzy  . Cephalexin Other (See Comments)    Reaction not recalled by the patient  . Codeine Other (See Comments)    Reaction not recalled by the patient  . Lansoprazole Other (See Comments)    Reaction not recalled by the patient  . Latex Rash       . Levofloxacin Other (See Comments)    Reaction not recalled by the patient  . Nitrofurantoin Other (See Comments)    Reaction not recalled by the patient  . Rofecoxib Other (See Comments)    Reaction not recalled by the patient     Medications: Outpatient Encounter Medications as of 06/03/2020  Medication Sig  . Accu-Chek Softclix Lancets lancets Use as instructed  . acetaminophen (TYLENOL) 500 MG tablet Take 500 mg by mouth every 6 (six) hours as needed.  . blood glucose meter kit and supplies KIT Dispense based on patient and insurance preference. Check sugars once daily and as needed as directed.  . clobetasol cream (TEMOVATE) 0.89 % Apply 1 application topically as needed (for skin irritation- to affected areas).   . cyanocobalamin (,VITAMIN B-12,) 1000 MCG/ML injection Inject 1,000 mcg into the muscle every 30 (thirty) days.  . flecainide (TAMBOCOR) 50 MG tablet Take 1 tablet (50 mg total) by mouth 2 (two) times daily.  Marland Kitchen glucose blood (ACCU-CHEK GUIDE) test strip USE AS DIRECTED  . hydrALAZINE (APRESOLINE) 25 MG tablet Take 1 tablet (25 mg total) by mouth 3 (three) times daily.  Marland Kitchen losartan (COZAAR) 100 MG tablet TAKE 1 TABLET BY MOUTH  DAILY  . metFORMIN (GLUCOPHAGE-XR) 500 MG 24 hr tablet TAKE 1 TABLET BY MOUTH ONCE DAILY WITH BREAKFAST (Patient taking differently: Take 500 mg by mouth daily with  breakfast.)  . metoprolol succinate (TOPROL-XL) 50 MG 24 hr tablet Take 1.5 tablets (75 mg total) by mouth daily. Take with or immediately following a meal.  . omeprazole (PRILOSEC) 40 MG capsule Take 1 capsule (40 mg total) by mouth daily.  . tamoxifen (NOLVADEX) 20 MG tablet TAKE 1 TABLET BY MOUTH  DAILY  . terconazole (TERAZOL 7) 0.4 % vaginal cream Place 1 applicator vaginally at bedtime.   Facility-Administered Encounter Medications as of 06/03/2020  Medication  . cyanocobalamin ((VITAMIN B-12)) injection 1,000 mcg    Current Diagnosis: Patient Active Problem List   Diagnosis Date Noted  . Lichen sclerosus et atrophicus of the vulva 04/04/2020  . Cataract 04/04/2020  . Lichen sclerosus et atrophicus 04/04/2020  . Mild memory disturbance 01/30/2020  . Palpitations 05/23/2019  . Chronic diastolic CHF (congestive heart failure) ([place]) 05/14/2019  . Hypertensive urgency 05/14/2019  . Mild tricuspid regurgitation 05/13/2019  . LVH (left ventricular hypertrophy), mild 05/13/2019  . Genetic testing 08/30/2017  . Depression 06/01/2017  . Family history of breast cancer   . Family history of colon cancer   . Family history of pancreatic cancer   . Abnormal electrocardiogram 05/05/2015  . BPPV (benign paroxysmal positional vertigo) 10/01/2014  . Murmur 04/18/2014  . Anal fissure 12/16/2011  . Fibrocystic breast changes. Left.  Biopsy 07/02/2011. 07/14/2011  . History of breast cancer, Right, T1c, N0, lumpectomy 06/26/2008.  Left, T1c, N0, lumpectomy 06/24/2006. 05/12/2011  . Diabetes mellitus type 2, controlled ([place]) 06/11/2009  . PREMATURE VENTRICULAR CONTRACTIONS 08/19/2008  . B12 deficiency 08/29/2007  . ESOPHAGEAL STRICTURE 04/27/2007  . GERD (gastroesophageal reflux disease) 04/27/2007  . DIVERTICULOSIS, COLON 04/27/2007  . OTHER ALOPECIA 02/14/2007  . Dyslipidemia 01/02/2007  . Essential hypertension 01/02/2007  . Carotid stenosis 08/22/2006    Goals Addressed   None      Follow-Up:  Pharmacist Review   Recent Relevant Labs: Lab Results  Component Value Date/Time   HGBA1C 6.7 (H) 02/07/2020 09:03 AM   HGBA1C 6.7 (A) 08/13/2019 02:20 PM   HGBA1C 6.7 (H) 05/15/2019 05:31 AM   MICROALBUR 1.0 08/23/2018 11:51 AM   MICROALBUR 0.8 09/21/2016 09:46 AM    Kidney Function Lab Results  Component Value Date/Time   CREATININE 0.96 02/07/2020 09:03 AM   CREATININE 1.20 (H) 11/16/2019 01:02 PM   CREATININE 1.20 (H) 10/22/2019 01:07 PM   CREATININE 1.1 02/28/2017 02:53 PM   CREATININE 0.9 07/13/2013 08:54 AM   GFR 52.16 (L) 02/07/2020 09:03 AM   GFRNONAA 40 (L) 10/22/2019 01:07 PM   GFRAA 46 (L) 10/22/2019 01:07 PM    . Current antihyperglycemic regimen: The patient takes metformin   . What recent interventions/DTPs have been made to improve glycemic control: None  . Have there been any recent hospitalizations or ED visits since last visit with CPP? No, patient has not been to the hospital or ED  . Patient denies hypoglycemic symptoms . Patient denies hyperglycemic symptoms . How often are you checking your blood sugar? The patient states that she checks blood sugar 3 or 4 times a week  . What are your blood sugars ranging? The patient states that her blood sugars range between 130-145 o Fasting: NA o Before meals: Last check for patient was 130 o After meals: NA o Bedtime: NA . During the week, how often does your blood glucose drop below 70? The patient states that blood sugar never below 70 . Are you checking your feet daily/regularly?   Adherence Review: Is the patient currently on a STATIN medication? No Is the patient currently on ACE/ARB medication? Yes, losartan Does the patient have >5 day gap between last estimated fill dates? No   Wendy Poet, [place]   Time spent:23

## 2020-06-05 DIAGNOSIS — M79601 Pain in right arm: Secondary | ICD-10-CM | POA: Diagnosis not present

## 2020-06-11 DIAGNOSIS — L821 Other seborrheic keratosis: Secondary | ICD-10-CM | POA: Diagnosis not present

## 2020-06-11 DIAGNOSIS — L570 Actinic keratosis: Secondary | ICD-10-CM | POA: Diagnosis not present

## 2020-06-11 DIAGNOSIS — Z85828 Personal history of other malignant neoplasm of skin: Secondary | ICD-10-CM | POA: Diagnosis not present

## 2020-06-12 ENCOUNTER — Ambulatory Visit (INDEPENDENT_AMBULATORY_CARE_PROVIDER_SITE_OTHER): Payer: Medicare Other

## 2020-06-12 ENCOUNTER — Other Ambulatory Visit: Payer: Self-pay | Admitting: Internal Medicine

## 2020-06-12 ENCOUNTER — Other Ambulatory Visit: Payer: Self-pay

## 2020-06-12 DIAGNOSIS — E538 Deficiency of other specified B group vitamins: Secondary | ICD-10-CM | POA: Diagnosis not present

## 2020-06-12 NOTE — Progress Notes (Addendum)
B12 given please co-sign Dr Quay Burow is out of the office  Medical screening examination/treatment/procedure(s) were performed by non-physician practitioner and as supervising physician I was immediately available for consultation/collaboration.  I agree with above. Lew Dawes, MD

## 2020-07-15 ENCOUNTER — Other Ambulatory Visit: Payer: Self-pay

## 2020-07-16 ENCOUNTER — Ambulatory Visit (INDEPENDENT_AMBULATORY_CARE_PROVIDER_SITE_OTHER): Payer: Medicare Other

## 2020-07-16 DIAGNOSIS — E538 Deficiency of other specified B group vitamins: Secondary | ICD-10-CM | POA: Diagnosis not present

## 2020-07-16 NOTE — Progress Notes (Signed)
Pt here for monthly B12 injection per Dr. Quay Burow  B12 1017mcg given  Left deltoid IM, and pt tolerated injection well.  Next B12 injection scheduled for 08/18/20

## 2020-08-04 ENCOUNTER — Other Ambulatory Visit: Payer: Self-pay | Admitting: Oncology

## 2020-08-04 DIAGNOSIS — Z853 Personal history of malignant neoplasm of breast: Secondary | ICD-10-CM

## 2020-08-10 ENCOUNTER — Other Ambulatory Visit: Payer: Self-pay | Admitting: Oncology

## 2020-08-11 ENCOUNTER — Telehealth: Payer: Self-pay | Admitting: Oncology

## 2020-08-11 ENCOUNTER — Telehealth (INDEPENDENT_AMBULATORY_CARE_PROVIDER_SITE_OTHER): Payer: Self-pay

## 2020-08-11 NOTE — Progress Notes (Signed)
HPI: FU palpitations and cerebrovascular disease; history of PACs and PVCs with normal LV function. She had significant palpitations in the past despite beta-blockade. She is therefore on flecainide 50 mg p.o. b.i.d. A previous catheterization in June 2000 showed normal coronary arteries and normal LV function. Nuclear study February 2017 showed ejection fraction 72%and normal perfusion.Carotid dopplers 2/18 showed1-39 bilateral stenosis.Chest CT April 2021 showed biapical scarring and coronary artery calcification. Echocardiogram December 2021 showed normal LV function, severe left ventricular hypertrophy, grade 2 diastolic dysfunction, mild left atrial enlargement, mild mitral regurgitation. Since I last saw her,she denies dyspnea, chest pain, palpitations or syncope.  Current Outpatient Medications  Medication Sig Dispense Refill  . acetaminophen (TYLENOL) 500 MG tablet Take 500 mg by mouth every 6 (six) hours as needed.    . clobetasol cream (TEMOVATE) 4.00 % Apply 1 application topically as needed (for skin irritation- to affected areas).     . cyanocobalamin (,VITAMIN B-12,) 1000 MCG/ML injection Inject 1,000 mcg into the muscle every 30 (thirty) days.    . flecainide (TAMBOCOR) 50 MG tablet Take 1 tablet (50 mg total) by mouth 2 (two) times daily. 180 tablet 3  . hydrALAZINE (APRESOLINE) 25 MG tablet Take 1 tablet (25 mg total) by mouth 3 (three) times daily. 90 tablet 5  . losartan (COZAAR) 100 MG tablet TAKE 1 TABLET BY MOUTH  DAILY 90 tablet 3  . metFORMIN (GLUCOPHAGE-XR) 500 MG 24 hr tablet TAKE 1 TABLET BY MOUTH ONCE DAILY WITH BREAKFAST 90 tablet 3  . metoprolol succinate (TOPROL-XL) 50 MG 24 hr tablet Take 1.5 tablets (75 mg total) by mouth daily. Take with or immediately following a meal. 135 tablet 3  . omeprazole (PRILOSEC) 40 MG capsule TAKE 1 CAPSULE BY MOUTH  DAILY 90 capsule 3  . tamoxifen (NOLVADEX) 20 MG tablet TAKE 1 TABLET BY MOUTH  DAILY 90 tablet 3  .  terconazole (TERAZOL 7) 0.4 % vaginal cream Place 1 applicator vaginally at bedtime.    . Accu-Chek Softclix Lancets lancets Use as instructed 100 each 4  . blood glucose meter kit and supplies KIT Dispense based on patient and insurance preference. Check sugars once daily and as needed as directed. 1 each 0  . glucose blood (ACCU-CHEK GUIDE) test strip USE AS DIRECTED 100 strip 4   Current Facility-Administered Medications  Medication Dose Route Frequency Provider Last Rate Last Admin  . cyanocobalamin ((VITAMIN B-12)) injection 1,000 mcg  1,000 mcg Intramuscular Q30 days Binnie Rail, MD   1,000 mcg at 08/18/20 1334     Past Medical History:  Diagnosis Date  . Arthritis    left hip  . Breast cancer, right ([place]) 11/2016  . Family history of breast cancer   . Family history of colon cancer   . Family history of pancreatic cancer   . GERD (gastroesophageal reflux disease)   . Non-insulin dependent type 2 diabetes mellitus ([place])   . PAC (premature atrial contraction)   . Personal history of radiation therapy 12/2016  . Premature ventricular contraction   . Seasonal allergies   . Sensitive skin   . Vitamin B12 deficiency     Past Surgical History:  Procedure Laterality Date  . ABDOMINAL HYSTERECTOMY  1979   partial  . BREAST BIOPSY  07/02/2011   Procedure: BREAST BIOPSY WITH NEEDLE LOCALIZATION;  Surgeon: Shann Medal, MD;  Location: [facility];  Service: General;  Laterality: Left;  left breast atypical hyperplasia needle localization biopsy  .  BREAST BIOPSY Left 06/13/2006  . BREAST BIOPSY Right 06/27/2008  . BREAST BIOPSY Right 09/27/2016  . BREAST EXCISIONAL BIOPSY Left 07/02/2011  . BREAST LUMPECTOMY Left 07/04/2006  . BREAST LUMPECTOMY Right 06/26/2008  . BREAST LUMPECTOMY Right 09/27/2016  . BREAST LUMPECTOMY WITH RADIOACTIVE SEED LOCALIZATION Right 11/18/2016   Procedure: RIGHT BREAST LUMPECTOMY WITH RADIOACTIVE SEED LOCALIZATION ERAS PATHWAY;  Surgeon: Alphonsa Overall, MD;   Location: [facility];  Service: General;  Laterality: Right;  ERAS PATHWAY  . CATARACT EXTRACTION Bilateral   . CHOLECYSTECTOMY, LAPAROSCOPIC  07/07/2001   had appendex taking out at the same time  . ESOPHAGEAL DILATION  2006  . LAPAROSCOPIC APPENDECTOMY  07/07/2001  . TONSILLECTOMY  1942    Social History   Socioeconomic History  . Marital status: Divorced    Spouse name: Marcello Moores  . Number of children: 1  . Years of education: Not on file  . Highest education level: Not on file  Occupational History  . Occupation: retire  Tobacco Use  . Smoking status: Never Smoker  . Smokeless tobacco: Never Used  Vaping Use  . Vaping Use: Never used  Substance and Sexual Activity  . Alcohol use: No    Alcohol/week: 0.0 standard drinks  . Drug use: No  . Sexual activity: Not Currently  Other Topics Concern  . Not on file  Social History Narrative  . Not on file   Social Determinants of Health   Financial Resource Strain: Low Risk   . Difficulty of Paying Living Expenses: Not hard at all  Food Insecurity: No Food Insecurity  . Worried About Charity fundraiser in the Last Year: Never true  . Ran Out of Food in the Last Year: Never true  Transportation Needs: No Transportation Needs  . Lack of Transportation (Medical): No  . Lack of Transportation (Non-Medical): No  Physical Activity: Inactive  . Days of Exercise per Week: 0 days  . Minutes of Exercise per Session: 0 min  Stress: No Stress Concern Present  . Feeling of Stress : Not at all  Social Connections: Not on file  Intimate Partner Violence: Not on file    Family History  Problem Relation Age of Onset  . Colon cancer Mother 74  . Heart attack Mother   . Breast cancer Sister 73  . Heart disease Maternal Uncle        CABG  . Breast cancer Sister 42  . Heart disease Brother   . Other Father        suicide  . Breast cancer Sister 31  . Heart disease Sister   . COPD Brother   . Heart disease Brother    . Breast cancer Other 44       niece - brother's daughter  . Pancreatic cancer Other        brother's son, dx in his 83s    ROS: Shoulder pain but no fevers or chills, productive cough, hemoptysis, dysphasia, odynophagia, melena, hematochezia, dysuria, hematuria, rash, seizure activity, orthopnea, PND, pedal edema, claudication. Remaining systems are negative.  Physical Exam: Well-developed well-nourished in no acute distress.  Skin is warm and dry.  HEENT is normal.  Neck is supple.  Bilateral bruits Chest is clear to auscultation with normal expansion.  Cardiovascular exam is regular rate and rhythm.  Abdominal exam nontender or distended. No masses palpated. Extremities show no edema. neuro grossly intact  A/P  1 palpitations-this is felt secondary to PVCs.  Continue Toprol.  2 hypertension-blood  pressure mildly elevated; I have asked her to follow this and we will increase medications as needed.  3 coronary artery disease-based on coronary calcification noted on previous CT scan.  Continue aspirin.  Intolerant to statins.  4 carotid artery disease-mild on most recent Dopplers.  Kirk Ruths, MD

## 2020-08-11 NOTE — Telephone Encounter (Signed)
Mrs. Pinson had called and left message with answering service that her left jaw had popped out of joint. The service had called Dr. Lucia Gaskins. Per Dr. Lucia Gaskins I called pt and advised her to call [facility]. Augustina Mood and gave her their #.

## 2020-08-11 NOTE — Telephone Encounter (Signed)
R/s appt per 5/8 sch msg. Pt aware.  

## 2020-08-18 ENCOUNTER — Other Ambulatory Visit: Payer: Self-pay | Admitting: Internal Medicine

## 2020-08-18 ENCOUNTER — Other Ambulatory Visit: Payer: Self-pay

## 2020-08-18 ENCOUNTER — Ambulatory Visit (INDEPENDENT_AMBULATORY_CARE_PROVIDER_SITE_OTHER): Payer: Medicare Other

## 2020-08-18 DIAGNOSIS — E538 Deficiency of other specified B group vitamins: Secondary | ICD-10-CM | POA: Diagnosis not present

## 2020-08-18 NOTE — Progress Notes (Signed)
Pt here for monthly B12 injection per Dr. Quay Burow  B12 1037mcg given left deltoid IM, and pt tolerated injection well.  Next B12 injection scheduled for 09/18/20

## 2020-08-19 ENCOUNTER — Ambulatory Visit: Payer: Medicare Other | Admitting: Cardiology

## 2020-08-19 ENCOUNTER — Encounter: Payer: Self-pay | Admitting: Cardiology

## 2020-08-19 VITALS — BP 152/54 | HR 60 | Ht 63.0 in | Wt 127.6 lb

## 2020-08-19 DIAGNOSIS — I10 Essential (primary) hypertension: Secondary | ICD-10-CM

## 2020-08-19 DIAGNOSIS — E785 Hyperlipidemia, unspecified: Secondary | ICD-10-CM | POA: Diagnosis not present

## 2020-08-19 DIAGNOSIS — R002 Palpitations: Secondary | ICD-10-CM

## 2020-08-19 NOTE — Patient Instructions (Signed)

## 2020-08-28 DIAGNOSIS — H6122 Impacted cerumen, left ear: Secondary | ICD-10-CM | POA: Diagnosis not present

## 2020-09-03 ENCOUNTER — Encounter: Payer: Self-pay | Admitting: Internal Medicine

## 2020-09-03 NOTE — Patient Instructions (Addendum)
  Blood work was ordered.     Medications changes include :   None   Try tylenol and voltaren gel for you have arthritis.       Please followup in 6 months

## 2020-09-03 NOTE — Progress Notes (Signed)
Subjective:    Patient ID: Tami Jacobson, female    DOB: April 29, 1927, 87 y.o.   MRN: [id number]  HPI The patient is here for follow up of their chronic medical problems, including htn, DM, GERD, B12 def  Her left jaw locked up 3 weeks ago.  It stayed x 24 hrs and she was able to unlock it.  She saw a dental specialist.  It is better now - no pain , but makes a noise.    Medications and allergies reviewed with patient and updated if appropriate.  Patient Active Problem List   Diagnosis Date Noted  . Lichen sclerosus et atrophicus of the vulva 04/04/2020  . Cataract 04/04/2020  . Lichen sclerosus et atrophicus 04/04/2020  . Mild memory disturbance 01/30/2020  . Palpitations 05/23/2019  . Chronic diastolic CHF (congestive heart failure) ([place]) 05/14/2019  . Hypertensive urgency 05/14/2019  . Mild tricuspid regurgitation 05/13/2019  . LVH (left ventricular hypertrophy), mild 05/13/2019  . Genetic testing 08/30/2017  . Depression 06/01/2017  . Family history of breast cancer   . Family history of colon cancer   . Family history of pancreatic cancer   . Abnormal electrocardiogram 05/05/2015  . BPPV (benign paroxysmal positional vertigo) 10/01/2014  . Murmur 04/18/2014  . Anal fissure 12/16/2011  . Fibrocystic breast changes. Left.  Biopsy 07/02/2011. 07/14/2011  . History of breast cancer, Right, T1c, N0, lumpectomy 06/26/2008.   Left, T1c, N0, lumpectomy 06/24/2006. 05/12/2011  . Diabetes mellitus type 2, controlled ([place]) 06/11/2009  . PREMATURE VENTRICULAR CONTRACTIONS 08/19/2008  . B12 deficiency 08/29/2007  . ESOPHAGEAL STRICTURE 04/27/2007  . GERD (gastroesophageal reflux disease) 04/27/2007  . DIVERTICULOSIS, COLON 04/27/2007  . OTHER ALOPECIA 02/14/2007  . Dyslipidemia 01/02/2007  . Essential hypertension 01/02/2007  . Carotid stenosis 08/22/2006    Current Outpatient Medications on File Prior to Visit  Medication Sig Dispense Refill  . Accu-Chek Softclix  Lancets lancets Use as instructed 100 each 4  . acetaminophen (TYLENOL) 500 MG tablet Take 500 mg by mouth every 6 (six) hours as needed.    . blood glucose meter kit and supplies KIT Dispense based on patient and insurance preference. Check sugars once daily and as needed as directed. 1 each 0  . clobetasol cream (TEMOVATE) 8.34 % Apply 1 application topically as needed (for skin irritation- to affected areas).     . cyanocobalamin (,VITAMIN B-12,) 1000 MCG/ML injection Inject 1,000 mcg into the muscle every 30 (thirty) days.    . flecainide (TAMBOCOR) 50 MG tablet Take 1 tablet (50 mg total) by mouth 2 (two) times daily. 180 tablet 3  . glucose blood (ACCU-CHEK GUIDE) test strip USE AS DIRECTED 100 strip 4  . hydrALAZINE (APRESOLINE) 25 MG tablet Take 1 tablet (25 mg total) by mouth 3 (three) times daily. 90 tablet 5  . losartan (COZAAR) 100 MG tablet TAKE 1 TABLET BY MOUTH  DAILY 90 tablet 3  . metFORMIN (GLUCOPHAGE-XR) 500 MG 24 hr tablet TAKE 1 TABLET BY MOUTH ONCE DAILY WITH BREAKFAST 90 tablet 3  . metoprolol succinate (TOPROL-XL) 50 MG 24 hr tablet Take 1.5 tablets (75 mg total) by mouth daily. Take with or immediately following a meal. 135 tablet 3  . omeprazole (PRILOSEC) 40 MG capsule TAKE 1 CAPSULE BY MOUTH  DAILY 90 capsule 3  . tamoxifen (NOLVADEX) 20 MG tablet TAKE 1 TABLET BY MOUTH  DAILY 90 tablet 3  . terconazole (TERAZOL 7) 0.4 % vaginal cream Place 1 applicator vaginally at bedtime.  Current Facility-Administered Medications on File Prior to Visit  Medication Dose Route Frequency Provider Last Rate Last Admin  . cyanocobalamin ((VITAMIN B-12)) injection 1,000 mcg  1,000 mcg Intramuscular Q30 days Binnie Rail, MD   1,000 mcg at 08/18/20 1334    Past Medical History:  Diagnosis Date  . Arthritis    left hip  . Breast cancer, right ([place]) 11/2016  . Family history of breast cancer   . Family history of colon cancer   . Family history of pancreatic cancer   . GERD  (gastroesophageal reflux disease)   . Non-insulin dependent type 2 diabetes mellitus ([place])   . PAC (premature atrial contraction)   . Personal history of radiation therapy 12/2016  . Premature ventricular contraction   . Seasonal allergies   . Sensitive skin   . Vitamin B12 deficiency     Past Surgical History:  Procedure Laterality Date  . ABDOMINAL HYSTERECTOMY  1979   partial  . BREAST BIOPSY  07/02/2011   Procedure: BREAST BIOPSY WITH NEEDLE LOCALIZATION;  Surgeon: Shann Medal, MD;  Location: [facility];  Service: General;  Laterality: Left;  left breast atypical hyperplasia needle localization biopsy  . BREAST BIOPSY Left 06/13/2006  . BREAST BIOPSY Right 06/27/2008  . BREAST BIOPSY Right 09/27/2016  . BREAST EXCISIONAL BIOPSY Left 07/02/2011  . BREAST LUMPECTOMY Left 07/04/2006  . BREAST LUMPECTOMY Right 06/26/2008  . BREAST LUMPECTOMY Right 09/27/2016  . BREAST LUMPECTOMY WITH RADIOACTIVE SEED LOCALIZATION Right 11/18/2016   Procedure: RIGHT BREAST LUMPECTOMY WITH RADIOACTIVE SEED LOCALIZATION ERAS PATHWAY;  Surgeon: Alphonsa Overall, MD;  Location: [facility];  Service: General;  Laterality: Right;  ERAS PATHWAY  . CATARACT EXTRACTION Bilateral   . CHOLECYSTECTOMY, LAPAROSCOPIC  07/07/2001   had appendex taking out at the same time  . ESOPHAGEAL DILATION  2006  . LAPAROSCOPIC APPENDECTOMY  07/07/2001  . TONSILLECTOMY  1942    Social History   Socioeconomic History  . Marital status: Divorced    Spouse name: Marcello Moores  . Number of children: 1  . Years of education: Not on file  . Highest education level: Not on file  Occupational History  . Occupation: retire  Tobacco Use  . Smoking status: Never Smoker  . Smokeless tobacco: Never Used  Vaping Use  . Vaping Use: Never used  Substance and Sexual Activity  . Alcohol use: No    Alcohol/week: 0.0 standard drinks  . Drug use: No  . Sexual activity: Not Currently  Other Topics Concern  . Not on file   Social History Narrative  . Not on file   Social Determinants of Health   Financial Resource Strain: Low Risk   . Difficulty of Paying Living Expenses: Not hard at all  Food Insecurity: No Food Insecurity  . Worried About Charity fundraiser in the Last Year: Never true  . Ran Out of Food in the Last Year: Never true  Transportation Needs: No Transportation Needs  . Lack of Transportation (Medical): No  . Lack of Transportation (Non-Medical): No  Physical Activity: Inactive  . Days of Exercise per Week: 0 days  . Minutes of Exercise per Session: 0 min  Stress: No Stress Concern Present  . Feeling of Stress : Not at all  Social Connections: Not on file    Family History  Problem Relation Age of Onset  . Colon cancer Mother 52  . Heart attack Mother   . Breast cancer Sister 31  . Heart disease  Maternal Uncle        CABG  . Breast cancer Sister 24  . Heart disease Brother   . Other Father        suicide  . Breast cancer Sister 22  . Heart disease Sister   . COPD Brother   . Heart disease Brother   . Breast cancer Other 14       niece - brother's daughter  . Pancreatic cancer Other        brother's son, dx in his 22s    Review of Systems  Constitutional: Negative for chills and fever.  Respiratory: Negative for cough, shortness of breath and wheezing.   Cardiovascular: Negative for chest pain, palpitations and leg swelling.  Neurological: Negative for light-headedness and headaches.       Objective:   Vitals:   09/04/20 1118  BP: 138/80  Pulse: 71  Temp: 98 F (36.7 C)  SpO2: 97%   BP Readings from Last 3 Encounters:  09/04/20 138/80  08/19/20 (!) 152/54  05/06/20 138/70   Wt Readings from Last 3 Encounters:  09/04/20 128 lb (58.1 kg)  08/19/20 127 lb 9.6 oz (57.9 kg)  05/06/20 130 lb 9.6 oz (59.2 kg)   Body mass index is 22.67 kg/m.   Physical Exam    Constitutional: Appears well-developed and well-nourished. No distress.  HENT:  Head:  Normocephalic and atraumatic.  Neck: Neck supple. No tracheal deviation present. No thyromegaly present.  No cervical lymphadenopathy Cardiovascular: Normal rate, regular rhythm and normal heart sounds.   2/6 sys murmur heard. No carotid bruit .  No edema Pulmonary/Chest: Effort normal and breath sounds normal. No respiratory distress. No has no wheezes. No rales.  Skin: Skin is warm and dry. Not diaphoretic.  Psychiatric: Normal mood and affect. Behavior is normal.      Assessment & Plan:    See Problem List for Assessment and Plan of chronic medical problems.    This visit occurred during the SARS-CoV-2 public health emergency.  Safety protocols were in place, including screening questions prior to the visit, additional usage of staff PPE, and extensive cleaning of exam room while observing appropriate contact time as indicated for disinfecting solutions.

## 2020-09-04 ENCOUNTER — Other Ambulatory Visit: Payer: Self-pay

## 2020-09-04 ENCOUNTER — Ambulatory Visit (INDEPENDENT_AMBULATORY_CARE_PROVIDER_SITE_OTHER): Payer: Medicare Other | Admitting: Internal Medicine

## 2020-09-04 VITALS — BP 138/80 | HR 71 | Temp 98.0°F | Ht 63.0 in | Wt 128.0 lb

## 2020-09-04 DIAGNOSIS — E118 Type 2 diabetes mellitus with unspecified complications: Secondary | ICD-10-CM

## 2020-09-04 DIAGNOSIS — I10 Essential (primary) hypertension: Secondary | ICD-10-CM

## 2020-09-04 DIAGNOSIS — E538 Deficiency of other specified B group vitamins: Secondary | ICD-10-CM | POA: Diagnosis not present

## 2020-09-04 DIAGNOSIS — K219 Gastro-esophageal reflux disease without esophagitis: Secondary | ICD-10-CM

## 2020-09-04 LAB — CBC WITH DIFFERENTIAL/PLATELET
Basophils Absolute: 0 10*3/uL (ref 0.0–0.1)
Basophils Relative: 0.7 % (ref 0.0–3.0)
Eosinophils Absolute: 0.1 10*3/uL (ref 0.0–0.7)
Eosinophils Relative: 1.1 % (ref 0.0–5.0)
HCT: 31.3 % — ABNORMAL LOW (ref 36.0–46.0)
Hemoglobin: 10.7 g/dL — ABNORMAL LOW (ref 12.0–15.0)
Lymphocytes Relative: 26 % (ref 12.0–46.0)
Lymphs Abs: 1.4 10*3/uL (ref 0.7–4.0)
MCHC: 34.3 g/dL (ref 30.0–36.0)
MCV: 94.7 fl (ref 78.0–100.0)
Monocytes Absolute: 0.4 10*3/uL (ref 0.1–1.0)
Monocytes Relative: 8 % (ref 3.0–12.0)
Neutro Abs: 3.5 10*3/uL (ref 1.4–7.7)
Neutrophils Relative %: 64.2 % (ref 43.0–77.0)
Platelets: 127 10*3/uL — ABNORMAL LOW (ref 150.0–400.0)
RBC: 3.3 Mil/uL — ABNORMAL LOW (ref 3.87–5.11)
RDW: 13.9 % (ref 11.5–15.5)
WBC: 5.4 10*3/uL (ref 4.0–10.5)

## 2020-09-04 LAB — HEMOGLOBIN A1C: Hgb A1c MFr Bld: 6.9 % — ABNORMAL HIGH (ref 4.6–6.5)

## 2020-09-04 NOTE — Assessment & Plan Note (Addendum)
Chronic B12 injection due next week  continue monthly injections

## 2020-09-04 NOTE — Assessment & Plan Note (Signed)
Chronic BP well controlled Continue hydralazine 25 mg TID, metoprolol xl 75 mg daily cmp

## 2020-09-04 NOTE — Assessment & Plan Note (Signed)
Chronic Continue metformin xr 500 mg daily  Check a1c today

## 2020-09-04 NOTE — Assessment & Plan Note (Signed)
Chronic GERD controlled Continue 40 mg daily

## 2020-09-04 NOTE — Addendum Note (Signed)
Addended by: Jacobo Forest on: 09/04/2020 11:58 AM   Modules accepted: Orders

## 2020-09-05 LAB — COMPREHENSIVE METABOLIC PANEL
ALT: 12 U/L (ref 0–35)
AST: 16 U/L (ref 0–37)
Albumin: 4 g/dL (ref 3.5–5.2)
Alkaline Phosphatase: 47 U/L (ref 39–117)
BUN: 13 mg/dL (ref 6–23)
CO2: 29 mEq/L (ref 19–32)
Calcium: 9.6 mg/dL (ref 8.4–10.5)
Chloride: 104 mEq/L (ref 96–112)
Creatinine, Ser: 1 mg/dL (ref 0.40–1.20)
GFR: 49.47 mL/min — ABNORMAL LOW (ref >60.00)
Glucose, Bld: 151 mg/dL — ABNORMAL HIGH (ref 70–99)
Potassium: 4.2 mEq/L (ref 3.5–5.1)
Sodium: 140 mEq/L (ref 135–145)
Total Bilirubin: 0.5 mg/dL (ref 0.2–1.2)
Total Protein: 6.4 g/dL (ref 6.0–8.3)

## 2020-09-10 ENCOUNTER — Telehealth: Payer: Self-pay | Admitting: Internal Medicine

## 2020-09-10 ENCOUNTER — Ambulatory Visit
Admission: RE | Admit: 2020-09-10 | Discharge: 2020-09-10 | Disposition: A | Discharge status: Home / Self Care | Payer: Medicare Other | Source: Ambulatory Visit | Attending: Oncology | Admitting: Oncology

## 2020-09-10 ENCOUNTER — Other Ambulatory Visit: Payer: Self-pay

## 2020-09-10 DIAGNOSIS — Z853 Personal history of malignant neoplasm of breast: Secondary | ICD-10-CM | POA: Diagnosis not present

## 2020-09-10 NOTE — Telephone Encounter (Signed)
Patient returned call in regards to lab results. She can be reached at [phone].

## 2020-09-11 NOTE — Telephone Encounter (Signed)
Spoke with patient yesterday and results given.

## 2020-09-18 ENCOUNTER — Other Ambulatory Visit: Payer: Self-pay

## 2020-09-18 ENCOUNTER — Ambulatory Visit (INDEPENDENT_AMBULATORY_CARE_PROVIDER_SITE_OTHER): Payer: Medicare Other

## 2020-09-18 DIAGNOSIS — E538 Deficiency of other specified B group vitamins: Secondary | ICD-10-CM

## 2020-09-18 NOTE — Progress Notes (Signed)
Pt here for monthly B12 injection per Dr. Quay Burow  B12 1031mcg given IM, and pt tolerated injection well.  Next B12 injection scheduled for 10/20/20

## 2020-09-22 ENCOUNTER — Telehealth: Payer: Self-pay | Admitting: Pharmacist

## 2020-09-22 NOTE — Progress Notes (Signed)
Chronic Care Management Pharmacy Assistant   Name: Tami Jacobson  MRN: [id number] DOB: [birth year]   Reason for Encounter: Disease State   Conditions to be addressed/monitored: DMII   Recent office visits:  09/04/20 Dr. Quay Burow, Internal Medicine, no medication changes  Recent consult visits:  08/19/20 Dr. Stanford Breed, Cardiology, no medication changes  Hospital visits:  None in previous 6 months  Medications: Outpatient Encounter Medications as of 09/22/2020  Medication Sig   Accu-Chek Softclix Lancets lancets Use as instructed   acetaminophen (TYLENOL) 500 MG tablet Take 500 mg by mouth every 6 (six) hours as needed.   blood glucose meter kit and supplies KIT Dispense based on patient and insurance preference. Check sugars once daily and as needed as directed.   clobetasol cream (TEMOVATE) 6.94 % Apply 1 application topically as needed (for skin irritation- to affected areas).    cyanocobalamin (,VITAMIN B-12,) 1000 MCG/ML injection Inject 1,000 mcg into the muscle every 30 (thirty) days.   flecainide (TAMBOCOR) 50 MG tablet Take 1 tablet (50 mg total) by mouth 2 (two) times daily.   glucose blood (ACCU-CHEK GUIDE) test strip USE AS DIRECTED   hydrALAZINE (APRESOLINE) 25 MG tablet Take 1 tablet (25 mg total) by mouth 3 (three) times daily.   losartan (COZAAR) 100 MG tablet TAKE 1 TABLET BY MOUTH  DAILY   metFORMIN (GLUCOPHAGE-XR) 500 MG 24 hr tablet TAKE 1 TABLET BY MOUTH ONCE DAILY WITH BREAKFAST   metoprolol succinate (TOPROL-XL) 50 MG 24 hr tablet Take 1.5 tablets (75 mg total) by mouth daily. Take with or immediately following a meal.   omeprazole (PRILOSEC) 40 MG capsule TAKE 1 CAPSULE BY MOUTH  DAILY   tamoxifen (NOLVADEX) 20 MG tablet TAKE 1 TABLET BY MOUTH  DAILY   terconazole (TERAZOL 7) 0.4 % vaginal cream Place 1 applicator vaginally at bedtime.   Facility-Administered Encounter Medications as of 09/22/2020  Medication   cyanocobalamin ((VITAMIN B-12)) injection  1,000 mcg    Pharmacist Review Recent Relevant Labs: Lab Results  Component Value Date/Time   HGBA1C 6.9 (H) 09/04/2020 11:59 AM   HGBA1C 6.7 (H) 02/07/2020 09:03 AM   MICROALBUR 1.0 08/23/2018 11:51 AM   MICROALBUR 0.8 09/21/2016 09:46 AM    Kidney Function Lab Results  Component Value Date/Time   CREATININE 1.00 09/04/2020 11:59 AM   CREATININE 0.96 02/07/2020 09:03 AM   CREATININE 1.20 (H) 11/16/2019 01:02 PM   CREATININE 1.1 02/28/2017 02:53 PM   CREATININE 0.9 07/13/2013 08:54 AM   GFR 49.47 (L) 09/04/2020 11:59 AM   GFRNONAA 40 (L) 10/22/2019 01:07 PM   GFRAA 46 (L) 10/22/2019 01:07 PM   Pharmacist Review  Current antihyperglycemic regimen:  Metformin 500 mg 1 tab daily  What recent interventions/DTPs have been made to improve glycemic control:  Continue current medication, per Dr. Quay Burow 09/04/20  Have there been any recent hospitalizations or ED visits since last visit with [facility]? No  Patient denies hypoglycemic symptoms, including None Patient denies hyperglycemic symptoms, including none  How often are you checking your blood sugar? Patient states she check blood sugar every 2 to 3 days  What are your blood sugars ranging? 130-140 Fasting: None Before meals: 156  09/12/20 After meals: none  Bedtime: none During the week, how often does your blood glucose drop below 70? Never  Are you checking your feet daily/regularly? Patient states that she does not have any selling, redness, sores, numbness or tingling in feet   Adherence Review: Is the patient currently  on a STATIN medication? No Is the patient currently on ACE/ARB medication? Yes Does the patient have >5 day gap between last estimated fill dates? No      Star Rating Drugs: Losartan 08/01/20 90 ds [place] Pharmacist Assistant [phone]   Time spent:30

## 2020-09-28 ENCOUNTER — Other Ambulatory Visit: Payer: Self-pay | Admitting: Cardiology

## 2020-09-28 ENCOUNTER — Other Ambulatory Visit: Payer: Self-pay | Admitting: Internal Medicine

## 2020-10-20 ENCOUNTER — Other Ambulatory Visit: Payer: Self-pay

## 2020-10-20 ENCOUNTER — Ambulatory Visit (INDEPENDENT_AMBULATORY_CARE_PROVIDER_SITE_OTHER): Payer: Medicare Other

## 2020-10-20 DIAGNOSIS — E538 Deficiency of other specified B group vitamins: Secondary | ICD-10-CM | POA: Diagnosis not present

## 2020-10-20 NOTE — Progress Notes (Signed)
Pt here for monthly B12 injection per Dr Quay Burow.  B12 1085mcg given IM in left deltoid and pt tolerated injection well.  Next B12 injection scheduled for 11/20/20.  Dr Ronnald Ramp: please cosign since PCP is out of office.

## 2020-10-21 ENCOUNTER — Ambulatory Visit: Payer: Medicare Other | Admitting: Oncology

## 2020-10-21 ENCOUNTER — Other Ambulatory Visit: Payer: Medicare Other

## 2020-11-03 ENCOUNTER — Other Ambulatory Visit: Payer: Self-pay | Admitting: *Deleted

## 2020-11-03 DIAGNOSIS — Z170 Estrogen receptor positive status [ER+]: Secondary | ICD-10-CM

## 2020-11-03 DIAGNOSIS — C50311 Malignant neoplasm of lower-inner quadrant of right female breast: Secondary | ICD-10-CM

## 2020-11-04 ENCOUNTER — Inpatient Hospital Stay: Payer: Medicare Other | Attending: Oncology

## 2020-11-04 ENCOUNTER — Other Ambulatory Visit: Payer: Self-pay

## 2020-11-04 ENCOUNTER — Inpatient Hospital Stay: Payer: Medicare Other | Admitting: Oncology

## 2020-11-04 VITALS — BP 156/44 | HR 65 | Temp 97.9°F | Resp 16 | Ht 63.0 in | Wt 125.3 lb

## 2020-11-04 DIAGNOSIS — Z923 Personal history of irradiation: Secondary | ICD-10-CM | POA: Insufficient documentation

## 2020-11-04 DIAGNOSIS — Z79810 Long term (current) use of selective estrogen receptor modulators (SERMs): Secondary | ICD-10-CM | POA: Diagnosis not present

## 2020-11-04 DIAGNOSIS — Z79899 Other long term (current) drug therapy: Secondary | ICD-10-CM | POA: Diagnosis not present

## 2020-11-04 DIAGNOSIS — Z170 Estrogen receptor positive status [ER+]: Secondary | ICD-10-CM | POA: Diagnosis not present

## 2020-11-04 DIAGNOSIS — Z853 Personal history of malignant neoplasm of breast: Secondary | ICD-10-CM

## 2020-11-04 DIAGNOSIS — Z803 Family history of malignant neoplasm of breast: Secondary | ICD-10-CM | POA: Insufficient documentation

## 2020-11-04 DIAGNOSIS — C50311 Malignant neoplasm of lower-inner quadrant of right female breast: Secondary | ICD-10-CM | POA: Insufficient documentation

## 2020-11-04 DIAGNOSIS — Z8249 Family history of ischemic heart disease and other diseases of the circulatory system: Secondary | ICD-10-CM | POA: Diagnosis not present

## 2020-11-04 DIAGNOSIS — Z800 Family history of malignant neoplasm of digestive organs: Secondary | ICD-10-CM | POA: Diagnosis not present

## 2020-11-04 DIAGNOSIS — Z7984 Long term (current) use of oral hypoglycemic drugs: Secondary | ICD-10-CM | POA: Diagnosis not present

## 2020-11-04 DIAGNOSIS — Z7982 Long term (current) use of aspirin: Secondary | ICD-10-CM | POA: Insufficient documentation

## 2020-11-04 LAB — CBC WITH DIFFERENTIAL (CANCER CENTER ONLY)
Abs Immature Granulocytes: 0.01 10*3/uL (ref 0.00–0.07)
Basophils Absolute: 0 10*3/uL (ref 0.0–0.1)
Basophils Relative: 1 %
Eosinophils Absolute: 0 10*3/uL (ref 0.0–0.5)
Eosinophils Relative: 1 %
HCT: 33.8 % — ABNORMAL LOW (ref 36.0–46.0)
Hemoglobin: 11.4 g/dL — ABNORMAL LOW (ref 12.0–15.0)
Immature Granulocytes: 0 %
Lymphocytes Relative: 21 %
Lymphs Abs: 0.9 10*3/uL (ref 0.7–4.0)
MCH: 32.3 pg (ref 26.0–34.0)
MCHC: 33.7 g/dL (ref 30.0–36.0)
MCV: 95.8 fL (ref 80.0–100.0)
Monocytes Absolute: 0.3 10*3/uL (ref 0.1–1.0)
Monocytes Relative: 7 %
Neutro Abs: 3.1 10*3/uL (ref 1.7–7.7)
Neutrophils Relative %: 70 %
Platelet Count: 128 10*3/uL — ABNORMAL LOW (ref 150–400)
RBC: 3.53 MIL/uL — ABNORMAL LOW (ref 3.87–5.11)
RDW: 13.1 % (ref 11.5–15.5)
WBC Count: 4.4 10*3/uL (ref 4.0–10.5)
nRBC: 0 % (ref 0.0–0.2)

## 2020-11-04 LAB — CMP (CANCER CENTER ONLY)
ALT: 14 U/L (ref 0–44)
AST: 17 U/L (ref 15–41)
Albumin: 3.8 g/dL (ref 3.5–5.0)
Alkaline Phosphatase: 45 U/L (ref 38–126)
Anion gap: 7 (ref 5–15)
BUN: 11 mg/dL (ref 8–23)
CO2: 28 mmol/L (ref 22–32)
Calcium: 9.9 mg/dL (ref 8.9–10.3)
Chloride: 104 mmol/L (ref 98–111)
Creatinine: 1.19 mg/dL — ABNORMAL HIGH (ref 0.44–1.00)
GFR, Estimated: 43 mL/min — ABNORMAL LOW (ref >60)
Glucose, Bld: 220 mg/dL — ABNORMAL HIGH (ref 70–99)
Potassium: 4.3 mmol/L (ref 3.5–5.1)
Sodium: 139 mmol/L (ref 135–145)
Total Bilirubin: 0.7 mg/dL (ref 0.3–1.2)
Total Protein: 6.8 g/dL (ref 6.5–8.1)

## 2020-11-04 NOTE — Progress Notes (Signed)
[place]  [phone] [phone]     ID: Elsa Ploch DOB: 1928/01/12  MR#: [id number]  IYM#:[id number]  Patient Care Team: Binnie Rail, MD as PCP - General (Internal Medicine) Stanford Breed Denice Bors, MD as Consulting Physician (Cardiology) Jyles Sontag, Virgie Dad, MD as Consulting Physician (Hematology and Oncology) Newton Pigg, MD as Consulting Physician (Obstetrics and Gynecology) Alphonsa Overall, MD as Consulting Physician (General Surgery) Rozetta Nunnery, MD as Consulting Physician (Otolaryngology) Kyung Rudd, MD as Consulting Physician (Radiation Oncology) Delice Bison, Charlestine Massed, NP as Nurse Practitioner (Hematology and Oncology) Luberta Mutter, MD as Consulting Physician (Ophthalmology) Charlton Haws, [facility] as Pharmacist (Pharmacist) OTHER MD:   CHIEF COMPLAINT: Estrogen receptor positive breast cancer  CURRENT TREATMENT:  tamoxifen   INTERVAL HISTORY: Aviella returns today for follow-up of her estrogen receptor positive breast cancer.   She continues on tamoxifen.  Hot flashes and vaginal wetness are not an issue.  She obtains the drug at no cost.  Since her last visit, she underwent bilateral diagnostic mammography with tomography at [facility] on 09/10/2020 showing: breast density category C; no evidence of malignancy in either breast.   REVIEW OF SYSTEMS: Janaa is having significant problems with her teeth.  Many of them are loose.  She really cannot chew meat.  She can eat some ground meat.  She has lost some weight as a result.  She did have some falls at home and she is now using a cane which is helping.  A detailed review of systems today was otherwise stable   COVID 19 VACCINATION STATUS: Moderna x3, most recently 03/2020   BREAST CANCER HISTORY: From the recent update summary:  The patient had bilateral screening mammography with tomography at the [facility] 06/28/2016 showing only post  surgical changes in both breasts. The patient had a history of right lateral breast cancer diagnosed in 2010, T1 cN0, status post lumpectomy March of that year, and she also has a history of T1 CN 0 left breast cancer status post lumpectomy in 2008. She had excisional biopsy on the left of what proved to be atypical lobular hyperplasia March 2013.  On 09/23/2016 the patient was evaluated for pain in the inferior right breast and a feeling of lumpiness. Right [facility] found the breast density to be category C. In the right breast there was skin thickening around the areola. This appeared unchanged. On tomography however there was a suggestion of a small spiculated mass in the inferior central right breast measuring 0.5 cm. This was palpable at the 5:00 position 2 cm from the nipple. Targeted ultrasonography confirmed an irregular mass in this position measuring 0.8 cm. Ultrasound of the right axilla was negative.  Biopsy of this right breast lower inner quadrant mass 09/27/2016 showed ([id number]) invasive lobular carcinoma, E-cadherin negative, grade not stated, estrogen receptor 80% positive, progesterone receptor 5% positive, both with strong staining intensity, with an MIB-1 of 5%, and no HER-2 amplification, the signals ratio being 1.21 and the number per cell 2.30.  The patient's subsequent history is as detailed below.   PAST MEDICAL HISTORY: Past Medical History:  Diagnosis Date   Arthritis    left hip   Breast cancer, right ([place]) 11/2016   Family history of breast cancer    Family history of colon cancer    Family history of pancreatic cancer    GERD (gastroesophageal reflux disease)    Non-insulin dependent type 2 diabetes mellitus (  [place])    PAC (premature atrial contraction)    Personal history of radiation therapy 12/2016   Premature ventricular contraction    Seasonal allergies    Sensitive skin    Vitamin B12 deficiency      PAST SURGICAL HISTORY: Past Surgical History:  Procedure Laterality Date   ABDOMINAL HYSTERECTOMY  1979   partial   BREAST BIOPSY  07/02/2011   Procedure: BREAST BIOPSY WITH NEEDLE LOCALIZATION;  Surgeon: Shann Medal, MD;  Location: [facility];  Service: General;  Laterality: Left;  left breast atypical hyperplasia needle localization biopsy   BREAST BIOPSY Left 06/13/2006   BREAST BIOPSY Right 06/27/2008   BREAST BIOPSY Right 09/27/2016   BREAST EXCISIONAL BIOPSY Left 07/02/2011   BREAST LUMPECTOMY Left 07/04/2006   BREAST LUMPECTOMY Right 06/26/2008   BREAST LUMPECTOMY Right 09/27/2016   BREAST LUMPECTOMY WITH RADIOACTIVE SEED LOCALIZATION Right 11/18/2016   Procedure: RIGHT BREAST LUMPECTOMY WITH RADIOACTIVE SEED LOCALIZATION ERAS PATHWAY;  Surgeon: Alphonsa Overall, MD;  Location: [facility];  Service: General;  Laterality: Right;  ERAS PATHWAY   CATARACT EXTRACTION Bilateral    CHOLECYSTECTOMY, LAPAROSCOPIC  07/07/2001   had appendex taking out at the same time   ESOPHAGEAL DILATION  2006   LAPAROSCOPIC APPENDECTOMY  07/07/2001   TONSILLECTOMY  1942    FAMILY HISTORY Family History  Problem Relation Age of Onset   Colon cancer Mother 54   Heart attack Mother    Breast cancer Sister 47   Heart disease Maternal Uncle        CABG   Breast cancer Sister 47   Heart disease Brother    Other Father        suicide   Breast cancer Sister 51   Heart disease Sister    COPD Brother    Heart disease Brother    Breast cancer Other 63       niece - brother's daughter   Pancreatic cancer Other        brother's son, dx in his 44s  The patient's father committed suicide at the age of 3. The patient's mother had a history of colon cancer diagnosed at age 74, she died at the age of 49 from unrelated causes. The patient is 36 sisters. 2 sisters died from breast cancer, 1 diagnosed age 76 diagnosed age 103. A third sister was diagnosed with breast cancer at age 76. The  patient has 2 brothers. There is no other history of cancer in the family to the patient's knowledge.   GYNECOLOGIC HISTORY:  GX P1, first live birth age 87. The patient took Premarin alone for nearly 30 years, status post hysterectomy. No LMP recorded. Patient has had a hysterectomy.   SOCIAL HISTORY:  She worked as a Network engineer for [employer]. Her husband of more than 6 year, Marcello Moores, "just left me and moved to an assisted living facility.". Their divorce was finalized August 2018. The patient tells me that her daughter Margarita Grizzle is estranged. The patient lives by herself, with no pets. [place] in [place]    ADVANCED DIRECTIVES: Not in place. At the 10/22/2019 visit the patient tells me she has named her friend Tawnya Crook as her healthcare power of attorney.  We have her phone number on file   HEALTH MAINTENANCE: Social History   Tobacco Use   Smoking status: Never   Smokeless tobacco: Never  Vaping Use   Vaping Use: Never used  Substance Use Topics  Alcohol use: No    Alcohol/week: 0.0 standard drinks   Drug use: No     Allergies  Allergen Reactions   Statins Other (See Comments)    GI UPSET   Effexor [Venlafaxine]     Constipation, shaking, dizzy   Cephalexin Other (See Comments)    Reaction not recalled by the patient   Codeine Other (See Comments)    Reaction not recalled by the patient   Lansoprazole Other (See Comments)    Reaction not recalled by the patient   Latex Rash        Levofloxacin Other (See Comments)    Reaction not recalled by the patient   Nitrofurantoin Other (See Comments)    Reaction not recalled by the patient   Rofecoxib Other (See Comments)    Reaction not recalled by the patient     Current Outpatient Medications  Medication Sig Dispense Refill   Accu-Chek Softclix Lancets lancets Use as instructed 100 each 4   acetaminophen (TYLENOL) 500 MG tablet Take 500 mg by mouth every 6 (six) hours  as needed.     blood glucose meter kit and supplies KIT Dispense based on patient and insurance preference. Check sugars once daily and as needed as directed. 1 each 0   clobetasol cream (TEMOVATE) 1.69 % Apply 1 application topically as needed (for skin irritation- to affected areas).      cyanocobalamin (,VITAMIN B-12,) 1000 MCG/ML injection Inject 1,000 mcg into the muscle every 30 (thirty) days.     flecainide (TAMBOCOR) 50 MG tablet TAKE 1 TABLET BY MOUTH  TWICE DAILY 180 tablet 3   glucose blood (ACCU-CHEK GUIDE) test strip USE AS DIRECTED 100 strip 4   hydrALAZINE (APRESOLINE) 25 MG tablet Take 1 tablet (25 mg total) by mouth 3 (three) times daily. 90 tablet 5   losartan (COZAAR) 100 MG tablet TAKE 1 TABLET BY MOUTH  DAILY 90 tablet 3   metFORMIN (GLUCOPHAGE-XR) 500 MG 24 hr tablet TAKE 1 TABLET BY MOUTH ONCE DAILY WITH BREAKFAST 90 tablet 3   metoprolol succinate (TOPROL-XL) 50 MG 24 hr tablet Take 1.5 tablets (75 mg total) by mouth daily. Take with or immediately following a meal. 135 tablet 3   omeprazole (PRILOSEC) 40 MG capsule TAKE 1 CAPSULE BY MOUTH  DAILY 90 capsule 3   tamoxifen (NOLVADEX) 20 MG tablet TAKE 1 TABLET BY MOUTH  DAILY 90 tablet 3   terconazole (TERAZOL 7) 0.4 % vaginal cream Place 1 applicator vaginally at bedtime.     Current Facility-Administered Medications  Medication Dose Route Frequency Provider Last Rate Last Admin   cyanocobalamin ((VITAMIN B-12)) injection 1,000 mcg  1,000 mcg Intramuscular Q30 days Binnie Rail, MD   1,000 mcg at 10/20/20 1349    OBJECTIVE:  white woman who appears frail  Vitals:   11/04/20 1104  BP: (!) 156/44  Pulse: 65  Resp: 16  Temp: 97.9 F (36.6 C)  SpO2: 100%      Body mass index is 22.2 kg/m.   Wt Readings from Last 3 Encounters:  11/04/20 125 lb 4.8 oz (56.8 kg)  09/04/20 128 lb (58.1 kg)  08/19/20 127 lb 9.6 oz (57.9 kg)      ECOG FS:1 - Symptomatic but completely ambulatory  Sclerae unicteric, EOMs  intact Wearing a mask No cervical or supraclavicular adenopathy Lungs no rales or rhonchi Heart regular rate and rhythm Abd soft, nontender, positive bowel sounds MSK no focal spinal tenderness, no upper extremity lymphedema Neuro: nonfocal,  well oriented, appropriate affect Breasts: The right breast has undergone lumpectomy and radiation, with a chronically inverted nipple.  The left breast is status post prior biopsies with no evidence of active disease.  Both axillae are benign.   LAB RESULTS:  CMP     Component Value Date/Time   NA 140 09/04/2020 1159   NA 136 02/28/2017 1453   K 4.2 09/04/2020 1159   K 5.1 02/28/2017 1453   CL 104 09/04/2020 1159   CL 106 07/11/2012 0901   CO2 29 09/04/2020 1159   CO2 26 02/28/2017 1453   GLUCOSE 151 (H) 09/04/2020 1159   GLUCOSE 179 (H) 02/28/2017 1453   GLUCOSE 110 (H) 07/11/2012 0901   BUN 13 09/04/2020 1159   BUN 18.2 02/28/2017 1453   CREATININE 1.00 09/04/2020 1159   CREATININE 1.20 (H) 11/16/2019 1302   CREATININE 1.1 02/28/2017 1453   CALCIUM 9.6 09/04/2020 1159   CALCIUM 10.1 02/28/2017 1453   PROT 6.4 09/04/2020 1159   PROT 7.6 02/28/2017 1453   ALBUMIN 4.0 09/04/2020 1159   ALBUMIN 3.9 02/28/2017 1453   AST 16 09/04/2020 1159   AST 12 02/28/2017 1453   ALT 12 09/04/2020 1159   ALT 10 02/28/2017 1453   ALKPHOS 47 09/04/2020 1159   ALKPHOS 84 02/28/2017 1453   BILITOT 0.5 09/04/2020 1159   BILITOT 0.70 02/28/2017 1453   GFRNONAA 40 (L) 10/22/2019 1307   GFRAA 46 (L) 10/22/2019 1307    No results found for: Ronnald Ramp, A1GS, A2GS, BETS, BETA2SER, GAMS, MSPIKE, SPEI  No results found for: Nils Pyle, [facility]  Lab Results  Component Value Date   WBC 4.4 11/04/2020   NEUTROABS 3.1 11/04/2020   HGB 11.4 (L) 11/04/2020   HCT 33.8 (L) 11/04/2020   MCV 95.8 11/04/2020   PLT 128 (L) 11/04/2020      Chemistry      Component Value Date/Time   NA 140 09/04/2020 1159   NA 136  02/28/2017 1453   K 4.2 09/04/2020 1159   K 5.1 02/28/2017 1453   CL 104 09/04/2020 1159   CL 106 07/11/2012 0901   CO2 29 09/04/2020 1159   CO2 26 02/28/2017 1453   BUN 13 09/04/2020 1159   BUN 18.2 02/28/2017 1453   CREATININE 1.00 09/04/2020 1159   CREATININE 1.20 (H) 11/16/2019 1302   CREATININE 1.1 02/28/2017 1453      Component Value Date/Time   CALCIUM 9.6 09/04/2020 1159   CALCIUM 10.1 02/28/2017 1453   ALKPHOS 47 09/04/2020 1159   ALKPHOS 84 02/28/2017 1453   AST 16 09/04/2020 1159   AST 12 02/28/2017 1453   ALT 12 09/04/2020 1159   ALT 10 02/28/2017 1453   BILITOT 0.5 09/04/2020 1159   BILITOT 0.70 02/28/2017 1453       Lab Results  Component Value Date   LABCA2 18 10/07/2009    No components found for: [id number]  No results for input(s): INR in the last 168 hours.  Urinalysis    Component Value Date/Time   COLORURINE YELLOW 05/14/2019 1852   APPEARANCEUR CLEAR 05/14/2019 1852   LABSPEC 1.006 05/14/2019 1852   PHURINE 6.0 05/14/2019 1852   GLUCOSEU NEGATIVE 05/14/2019 1852   HGBUR NEGATIVE 05/14/2019 1852   BILIRUBINUR NEGATIVE 05/14/2019 1852   KETONESUR NEGATIVE 05/14/2019 1852   PROTEINUR NEGATIVE 05/14/2019 1852   NITRITE NEGATIVE 05/14/2019 1852   LEUKOCYTESUR TRACE (A) 05/14/2019 1852    STUDIES: No results found.   ELIGIBLE FOR AVAILABLE RESEARCH PROTOCOL:  no  ASSESSMENT: 87 y.o. [place] woman   (1) status post left lumpectomy March 2008 for a pT1c invasive ductal carcinoma, grade 1, strongly estrogen and progesterone receptor positive, HER-2 not amplified, with a borderline MIB-1  (a) received tamoxifen, but very intermittently  (2) status post right lumpectomy and sentinel lymph node sampling 06/28/2008 for a pT1c pN0, stage IA invasive ductal carcinoma, grade 1, estrogen receptor 96% positive, progesterone receptor negative, HER-2/neu negative, with an MIB-1 of 14%  (a) received anastrozole April 2010 to April 2013  (3) status  post excision of an area of lobular carcinoma in situ, left breast, 07/02/2011  (a) on tamoxifen April 2013 to April 2015.  (4) status post biopsy of the right breast lower inner quadrant 09/27/2016 for a clinical T1a N0 invasive lobular carcinoma, E-cadherin negative, estrogen and progesterone receptor positive, with no HER-2 amplification and an MIB-1 of 5%.  (5) right lumpectomy without sentinel lymph node sampling 11/18/2016 found a pT1b cN0, stage IA invasive lobular breast cancer, grade 2, with negative margins  (6) adjuvant radiation 12/21/2016 - 01/17/2017  Site/dose:   Right breast/ 42.5 Gy in 17 fractions                    Boost: 7.5 Gy in 3 fractions  (7) tamoxifen started 03/05/2017  (8) offered genetics testing at meeting with genetics counselor 11/15/2016, but declined   PLAN: Korynne is now 4 years out from definitive surgery for her breast cancer with no evidence of disease recurrence.  This is very favorable.  The plan is to continue tamoxifen through December 2023.  She is going to see [place] in October 2023 and that likely will be her "graduation" visit.  We did review fall precautions today and although she is doing much better with her cane we certainly can get her a walker or Rollator if she needs 1  Otherwise she knows to call for any other issue that may develop before the next visit here  Total encounter time 20 minutes.*   Vincent Ehrler, Virgie Dad, MD  11/04/20 11:36 AM Medical Oncology and Hematology [facility] [place], [place] [address] Tel. [phone]    Fax. [phone]   I, Wilburn Mylar, am acting as scribe for Dr. Virgie Dad. Krystal Delduca.  I, Lurline Del MD, have reviewed the above documentation for accuracy and completeness, and I agree with the above.   *Total Encounter Time as defined by the Centers for Medicare and Medicaid Services includes, in addition to the face-to-face time of a patient visit (documented in the  note above) non-face-to-face time: obtaining and reviewing outside history, ordering and reviewing medications, tests or procedures, care coordination (communications with other health care professionals or caregivers) and documentation in the medical record.

## 2020-11-20 ENCOUNTER — Other Ambulatory Visit: Payer: Self-pay

## 2020-11-20 ENCOUNTER — Ambulatory Visit (INDEPENDENT_AMBULATORY_CARE_PROVIDER_SITE_OTHER): Payer: Medicare Other

## 2020-11-20 DIAGNOSIS — E538 Deficiency of other specified B group vitamins: Secondary | ICD-10-CM | POA: Diagnosis not present

## 2020-11-20 MED ORDER — CYANOCOBALAMIN 1000 MCG/ML IJ SOLN
1000.0000 ug | Freq: Once | INTRAMUSCULAR | Status: AC
  Administered 2020-11-20: 1000 ug via INTRAMUSCULAR

## 2020-11-20 NOTE — Progress Notes (Signed)
B12 given w/o any complications. 

## 2020-11-25 ENCOUNTER — Telehealth: Payer: Self-pay | Admitting: Pharmacist

## 2020-11-25 NOTE — Progress Notes (Signed)
Chronic Care Management Pharmacy Assistant   Name: Tami Jacobson  MRN: [id number] DOB: [birth year]  Reason for Encounter: Disease State - Diabetes  Recent office visits:  None noted  Recent consult visits:  11/04/2020 [place] (Oncology) - Estrogen receptor positive breast cancer. No medication changes.  Hospital visits:  None in previous 6 months  Medications: Outpatient Encounter Medications as of 11/25/2020  Medication Sig   Accu-Chek Softclix Lancets lancets Use as instructed   acetaminophen (TYLENOL) 500 MG tablet Take 500 mg by mouth every 6 (six) hours as needed.   blood glucose meter kit and supplies KIT Dispense based on patient and insurance preference. Check sugars once daily and as needed as directed.   clobetasol cream (TEMOVATE) 7.10 % Apply 1 application topically as needed (for skin irritation- to affected areas).    cyanocobalamin (,VITAMIN B-12,) 1000 MCG/ML injection Inject 1,000 mcg into the muscle every 30 (thirty) days.   flecainide (TAMBOCOR) 50 MG tablet TAKE 1 TABLET BY MOUTH  TWICE DAILY   glucose blood (ACCU-CHEK GUIDE) test strip USE AS DIRECTED   hydrALAZINE (APRESOLINE) 25 MG tablet Take 1 tablet (25 mg total) by mouth 3 (three) times daily.   losartan (COZAAR) 100 MG tablet TAKE 1 TABLET BY MOUTH  DAILY   metFORMIN (GLUCOPHAGE-XR) 500 MG 24 hr tablet TAKE 1 TABLET BY MOUTH ONCE DAILY WITH BREAKFAST   metoprolol succinate (TOPROL-XL) 50 MG 24 hr tablet Take 1.5 tablets (75 mg total) by mouth daily. Take with or immediately following a meal.   omeprazole (PRILOSEC) 40 MG capsule TAKE 1 CAPSULE BY MOUTH  DAILY   tamoxifen (NOLVADEX) 20 MG tablet TAKE 1 TABLET BY MOUTH  DAILY   terconazole (TERAZOL 7) 0.4 % vaginal cream Place 1 applicator vaginally at bedtime.   Facility-Administered Encounter Medications as of 11/25/2020  Medication   cyanocobalamin ((VITAMIN B-12)) injection 1,000 mcg   Recent Relevant Labs: Lab Results  Component Value  Date/Time   HGBA1C 6.9 (H) 09/04/2020 11:59 AM   HGBA1C 6.7 (H) 02/07/2020 09:03 AM   MICROALBUR 1.0 08/23/2018 11:51 AM   MICROALBUR 0.8 09/21/2016 09:46 AM    Kidney Function Lab Results  Component Value Date/Time   CREATININE 1.19 (H) 11/04/2020 10:36 AM   CREATININE 1.00 09/04/2020 11:59 AM   CREATININE 0.96 02/07/2020 09:03 AM   CREATININE 1.20 (H) 11/16/2019 01:02 PM   CREATININE 1.1 02/28/2017 02:53 PM   CREATININE 0.9 07/13/2013 08:54 AM   GFR 49.47 (L) 09/04/2020 11:59 AM   GFRNONAA 43 (L) 11/04/2020 10:36 AM   GFRAA 46 (L) 10/22/2019 01:07 PM    Current antihyperglycemic regimen:  Metformin 500 mg 1 tab daily  What recent interventions/DTPs have been made to improve glycemic control:  Continue current medication, per Dr. Quay Burow 09/04/2020  Have there been any recent hospitalizations or ED visits since last visit with CPP? No  Patient reports hypoglycemic symptoms, including None  Patient reports hyperglycemic symptoms, including light headiness.  How often are you checking your blood sugar? once daily  What are your blood sugars ranging?  Patient states she doesn't check her glucose everyday but her last readings were: 159  140 167 158 141 144  During the week, how often does your blood glucose drop below 70? Never  Are you checking your feet daily/regularly?  Patient states her her feet are always numb and cold.  Adherence Review: Is the patient currently on a STATIN medication? No Is the patient currently on ACE/ARB medication? Yes Does  the patient have >5 day gap between last estimated   Patient wants to be scheduled to be seen with Dr. Quay Burow, she is concerned about her BP being elevated and states she using a wrist BP monitor and her readings are always elevated. Patient states she took her BP monitor to [facility] and they told her that they are not always accurate. She also states she takes all her medications as directed.   Star Rating  Drugs: Metformin 500gm - Last filled 07/28/20 90D Losartan 139m - Last filled 06/12/2020 90D Patient uses mail order and recently received her refills.  LOrinda Kenner RPolk CityClinical Pharmacists Assistant [phone] Time Spent: [phone]

## 2020-11-26 DIAGNOSIS — H52203 Unspecified astigmatism, bilateral: Secondary | ICD-10-CM | POA: Diagnosis not present

## 2020-11-26 DIAGNOSIS — E119 Type 2 diabetes mellitus without complications: Secondary | ICD-10-CM | POA: Diagnosis not present

## 2020-11-26 DIAGNOSIS — H5000 Unspecified esotropia: Secondary | ICD-10-CM | POA: Diagnosis not present

## 2020-11-26 DIAGNOSIS — Z961 Presence of intraocular lens: Secondary | ICD-10-CM | POA: Diagnosis not present

## 2020-11-26 LAB — HM DIABETES EYE EXAM

## 2020-12-08 NOTE — Progress Notes (Signed)
Subjective:    Patient ID: Tami Jacobson, female    DOB: April 01, 1928, 87 y.o.   MRN: [id number]  HPI The patient is here for an acute visit for health concerns.   Dry mouth - her mouth gets very dry about 1.5 hr after her morning meds. She drinks a glass of water, glass of cranberry juice and a cup of coffee with breakfast.  She wondered if this was related to one of her medications-possibly the hydralazine.  Her blood pressure has been very well controlled at home.  She did bring her log with her.  She has the shakes in her hands.  It is worse with eating and writing.  No head shaking.    Her balance is not good.  She uses a cane.  She gets dizzy with quick head movements.    Having toe nail regrowth after having them removed.  She would like to see podiatry again, but is not sure where she went previously.  Medications and allergies reviewed with patient and updated if appropriate.  Patient Active Problem List   Diagnosis Date Noted   Lichen sclerosus et atrophicus of the vulva 04/04/2020   Cataract 40/98/1191   Lichen sclerosus et atrophicus 04/04/2020   Mild memory disturbance 01/30/2020   Palpitations 05/23/2019   Chronic diastolic CHF (congestive heart failure) ([place]) 05/14/2019   Hypertensive urgency 05/14/2019   Mild tricuspid regurgitation 05/13/2019   LVH (left ventricular hypertrophy), mild 05/13/2019   Genetic testing 08/30/2017   Depression 06/01/2017   Family history of breast cancer    Family history of colon cancer    Family history of pancreatic cancer    Abnormal electrocardiogram 05/05/2015   BPPV (benign paroxysmal positional vertigo) 10/01/2014   Murmur 04/18/2014   Anal fissure 12/16/2011   Fibrocystic breast changes. Left.  Biopsy 07/02/2011. 07/14/2011   History of breast cancer, Right, T1c, N0, lumpectomy 06/26/2008.   Left, T1c, N0, lumpectomy 06/24/2006. 05/12/2011   Diabetes mellitus type 2, controlled ([place]) 06/11/2009   PREMATURE  VENTRICULAR CONTRACTIONS 08/19/2008   B12 deficiency 08/29/2007   ESOPHAGEAL STRICTURE 04/27/2007   GERD (gastroesophageal reflux disease) 04/27/2007   DIVERTICULOSIS, COLON 04/27/2007   OTHER ALOPECIA 02/14/2007   Dyslipidemia 01/02/2007   Essential hypertension 01/02/2007   Carotid stenosis 08/22/2006    Current Outpatient Medications on File Prior to Visit  Medication Sig Dispense Refill   Accu-Chek Softclix Lancets lancets Use as instructed 100 each 4   acetaminophen (TYLENOL) 500 MG tablet Take 500 mg by mouth every 6 (six) hours as needed.     blood glucose meter kit and supplies KIT Dispense based on patient and insurance preference. Check sugars once daily and as needed as directed. 1 each 0   clobetasol cream (TEMOVATE) 4.78 % Apply 1 application topically as needed (for skin irritation- to affected areas).      cyanocobalamin (,VITAMIN B-12,) 1000 MCG/ML injection Inject 1,000 mcg into the muscle every 30 (thirty) days.     flecainide (TAMBOCOR) 50 MG tablet TAKE 1 TABLET BY MOUTH  TWICE DAILY 180 tablet 3   glucose blood (ACCU-CHEK GUIDE) test strip USE AS DIRECTED 100 strip 4   hydrALAZINE (APRESOLINE) 10 MG tablet Take 10 mg by mouth 3 (three) times daily.     losartan (COZAAR) 100 MG tablet TAKE 1 TABLET BY MOUTH  DAILY 90 tablet 3   metFORMIN (GLUCOPHAGE-XR) 500 MG 24 hr tablet TAKE 1 TABLET BY MOUTH ONCE DAILY WITH BREAKFAST 90 tablet 3  metoprolol succinate (TOPROL-XL) 50 MG 24 hr tablet Take 1.5 tablets (75 mg total) by mouth daily. Take with or immediately following a meal. 135 tablet 3   omeprazole (PRILOSEC) 40 MG capsule TAKE 1 CAPSULE BY MOUTH  DAILY 90 capsule 3   tamoxifen (NOLVADEX) 20 MG tablet TAKE 1 TABLET BY MOUTH  DAILY 90 tablet 3   terconazole (TERAZOL 7) 0.4 % vaginal cream Place 1 applicator vaginally at bedtime.     hydrALAZINE (APRESOLINE) 25 MG tablet Take 1 tablet (25 mg total) by mouth 3 (three) times daily. (Patient not taking: Reported on  12/09/2020) 90 tablet 5   Current Facility-Administered Medications on File Prior to Visit  Medication Dose Route Frequency Provider Last Rate Last Admin   cyanocobalamin ((VITAMIN B-12)) injection 1,000 mcg  1,000 mcg Intramuscular Q30 days Binnie Rail, MD   1,000 mcg at 10/20/20 1349    Past Medical History:  Diagnosis Date   Arthritis    left hip   Breast cancer, right ([place]) 11/2016   Family history of breast cancer    Family history of colon cancer    Family history of pancreatic cancer    GERD (gastroesophageal reflux disease)    Non-insulin dependent type 2 diabetes mellitus ([place])    PAC (premature atrial contraction)    Personal history of radiation therapy 12/2016   Premature ventricular contraction    Seasonal allergies    Sensitive skin    Vitamin B12 deficiency     Past Surgical History:  Procedure Laterality Date   ABDOMINAL HYSTERECTOMY  1979   partial   BREAST BIOPSY  07/02/2011   Procedure: BREAST BIOPSY WITH NEEDLE LOCALIZATION;  Surgeon: Shann Medal, MD;  Location: [facility];  Service: General;  Laterality: Left;  left breast atypical hyperplasia needle localization biopsy   BREAST BIOPSY Left 06/13/2006   BREAST BIOPSY Right 06/27/2008   BREAST BIOPSY Right 09/27/2016   BREAST EXCISIONAL BIOPSY Left 07/02/2011   BREAST LUMPECTOMY Left 07/04/2006   BREAST LUMPECTOMY Right 06/26/2008   BREAST LUMPECTOMY Right 09/27/2016   BREAST LUMPECTOMY WITH RADIOACTIVE SEED LOCALIZATION Right 11/18/2016   Procedure: RIGHT BREAST LUMPECTOMY WITH RADIOACTIVE SEED LOCALIZATION ERAS PATHWAY;  Surgeon: Alphonsa Overall, MD;  Location: [facility];  Service: General;  Laterality: Right;  ERAS PATHWAY   CATARACT EXTRACTION Bilateral    CHOLECYSTECTOMY, LAPAROSCOPIC  07/07/2001   had appendex taking out at the same time   ESOPHAGEAL DILATION  2006   LAPAROSCOPIC APPENDECTOMY  07/07/2001   TONSILLECTOMY  1942    Social History   Socioeconomic History   Marital  status: Divorced    Spouse name: Marcello Moores   Number of children: 1   Years of education: Not on file   Highest education level: Not on file  Occupational History   Occupation: retire  Tobacco Use   Smoking status: Never   Smokeless tobacco: Never  Vaping Use   Vaping Use: Never used  Substance and Sexual Activity   Alcohol use: No    Alcohol/week: 0.0 standard drinks   Drug use: No   Sexual activity: Not Currently  Other Topics Concern   Not on file  Social History Narrative   Not on file   Social Determinants of Health   Financial Resource Strain: Low Risk    Difficulty of Paying Living Expenses: Not hard at all  Food Insecurity: No Food Insecurity   Worried About Estate manager/land agent of Food in the Last Year: Never true  Ran Out of Food in the Last Year: Never true  Transportation Needs: No Transportation Needs   Lack of Transportation (Medical): No   Lack of Transportation (Non-Medical): No  Physical Activity: Inactive   Days of Exercise per Week: 0 days   Minutes of Exercise per Session: 0 min  Stress: No Stress Concern Present   Feeling of Stress : Not at all  Social Connections: Not on file    Family History  Problem Relation Age of Onset   Colon cancer Mother 5   Heart attack Mother    Breast cancer Sister 16   Heart disease Maternal Uncle        CABG   Breast cancer Sister 56   Heart disease Brother    Other Father        suicide   Breast cancer Sister 58   Heart disease Sister    COPD Brother    Heart disease Brother    Breast cancer Other 57       niece - brother's daughter   Pancreatic cancer Other        brother's son, dx in his 63s    Review of Systems  Constitutional:  Negative for fever.  HENT:         Dry mouth  Musculoskeletal:  Positive for gait problem.  Neurological:  Positive for tremors (b/l hands).      Objective:   Vitals:   12/09/20 1346  BP: 120/64  Pulse: 65  Temp: 98.2 F (36.8 C)  SpO2: 98%   BP Readings from Last 3  Encounters:  12/09/20 120/64  11/04/20 (!) 156/44  09/04/20 138/80   Wt Readings from Last 3 Encounters:  12/09/20 127 lb 6.4 oz (57.8 kg)  11/04/20 125 lb 4.8 oz (56.8 kg)  09/04/20 128 lb (58.1 kg)   Body mass index is 22.57 kg/m.   Physical Exam    Constitutional: Appears well-developed and well-nourished. No distress.  Head: Normocephalic and atraumatic.  Neck: Neck supple. No tracheal deviation present. No thyromegaly present.  No cervical lymphadenopathy Cardiovascular: Normal rate, regular rhythm and normal heart sounds.  3/6 systolic murmur heard. No edema Pulmonary/Chest: Effort normal and breath sounds normal. No respiratory distress. No has no wheezes. No rales.  Neuro:  no tremor at exam, no rigidity Skin: Skin is warm and dry. Not diaphoretic.  Big toe nail regrowth after having nails removed Psychiatric: Normal mood and affect. Behavior is normal.       Assessment & Plan:    See Problem List for Assessment and Plan of chronic medical problems.    This visit occurred during the SARS-CoV-2 public health emergency.  Safety protocols were in place, including screening questions prior to the visit, additional usage of staff PPE, and extensive cleaning of exam room while observing appropriate contact time as indicated for disinfecting solutions.

## 2020-12-09 ENCOUNTER — Encounter: Payer: Self-pay | Admitting: Internal Medicine

## 2020-12-09 ENCOUNTER — Other Ambulatory Visit: Payer: Self-pay

## 2020-12-09 ENCOUNTER — Ambulatory Visit (INDEPENDENT_AMBULATORY_CARE_PROVIDER_SITE_OTHER): Payer: Medicare Other | Admitting: Internal Medicine

## 2020-12-09 VITALS — BP 120/64 | HR 65 | Temp 98.2°F | Ht 63.0 in | Wt 127.4 lb

## 2020-12-09 DIAGNOSIS — R251 Tremor, unspecified: Secondary | ICD-10-CM | POA: Diagnosis not present

## 2020-12-09 DIAGNOSIS — I10 Essential (primary) hypertension: Secondary | ICD-10-CM

## 2020-12-09 DIAGNOSIS — Z23 Encounter for immunization: Secondary | ICD-10-CM

## 2020-12-09 DIAGNOSIS — R682 Dry mouth, unspecified: Secondary | ICD-10-CM

## 2020-12-09 NOTE — Assessment & Plan Note (Signed)
Chronic BP well controlled  Having dry mouth - ? Related to hydralazine Has had some low readings at home 100/58 Continue losartan 100 mg qd, metoprolol xl 75 mg daily Hold hydralazine for now and monitor BP at home - restart if BP gets high

## 2020-12-09 NOTE — Assessment & Plan Note (Signed)
Acute She noticed this is about an hour and a half after taking her morning medications Possible side effect from medications-she wonders about the hydralazine since that is the newer medication Blood pressure, very well controlled at home-sometimes on the low side Stop hydralazine 10 mg 3 times daily and monitor blood pressure-see if dryness improves Continue increased fluids

## 2020-12-09 NOTE — Patient Instructions (Addendum)
    Flu immunization administered today.    Medications changes include :   hold hydralazine for one week and monitor BP - see if your dry mouth improves     [facility]) Address: [address] [place], [place], [place] [address] Phone: [phone]

## 2020-12-09 NOTE — Assessment & Plan Note (Signed)
New problem Bilateral hand tremor that is getting worse She has had it for a while-worse with eating and writing No head tremor, voice tremor No known family history No resting tremor No obvious rigidity on exam Discussed observing versus seeing neurology at this point her symptoms are mild-she does not want to take any medication and does not feel that she needs further evaluation at that time Will monitor

## 2020-12-11 DIAGNOSIS — Z853 Personal history of malignant neoplasm of breast: Secondary | ICD-10-CM | POA: Diagnosis not present

## 2020-12-17 ENCOUNTER — Ambulatory Visit: Payer: Medicare Other | Admitting: Podiatry

## 2020-12-17 ENCOUNTER — Other Ambulatory Visit: Payer: Self-pay

## 2020-12-17 DIAGNOSIS — M79675 Pain in left toe(s): Secondary | ICD-10-CM

## 2020-12-17 DIAGNOSIS — B351 Tinea unguium: Secondary | ICD-10-CM

## 2020-12-17 DIAGNOSIS — M79674 Pain in right toe(s): Secondary | ICD-10-CM

## 2020-12-17 NOTE — Progress Notes (Signed)
   SUBJECTIVE Patient presents to office today complaining of elongated, thickened nails that cause pain while ambulating in shoes.  Patient is unable to trim their own nails. Patient is here for further evaluation and treatment.  Past Medical History:  Diagnosis Date   Arthritis    left hip   Breast cancer, right ([place]) 11/2016   Family history of breast cancer    Family history of colon cancer    Family history of pancreatic cancer    GERD (gastroesophageal reflux disease)    Non-insulin dependent type 2 diabetes mellitus ([place])    PAC (premature atrial contraction)    Personal history of radiation therapy 12/2016   Premature ventricular contraction    Seasonal allergies    Sensitive skin    Vitamin B12 deficiency     OBJECTIVE General Patient is awake, alert, and oriented x 3 and in no acute distress. Derm Skin is dry and supple bilateral. Negative open lesions or macerations. Remaining integument unremarkable. Nails are tender, long, thickened and dystrophic with subungual debris, consistent with onychomycosis, bilateral great toenails. No signs of infection noted. Vasc  DP and PT pedal pulses palpable bilaterally. Temperature gradient within normal limits.  Neuro Epicritic and protective threshold sensation grossly intact bilaterally.  Musculoskeletal Exam No symptomatic pedal deformities noted bilateral. Muscular strength within normal limits.  ASSESSMENT 1.  Pain due to onychomycosis of toenails both great toes  PLAN OF CARE 1. Patient evaluated today.  2. Instructed to maintain good pedal hygiene and foot care.  3. Mechanical debridement of bilateral great toenails performed using a nail nipper. Filed with dremel without incident.  4. Return to clinic in 3 mos.    Edrick Kins, DPM [facility]  Dr. Edrick Kins, DPM    [address], [place] [address]                Office [phone]  Fax [phone]

## 2020-12-23 ENCOUNTER — Ambulatory Visit (INDEPENDENT_AMBULATORY_CARE_PROVIDER_SITE_OTHER): Payer: Medicare Other

## 2020-12-23 ENCOUNTER — Other Ambulatory Visit: Payer: Self-pay

## 2020-12-23 DIAGNOSIS — E538 Deficiency of other specified B group vitamins: Secondary | ICD-10-CM | POA: Diagnosis not present

## 2020-12-23 MED ORDER — CYANOCOBALAMIN 1000 MCG/ML IJ SOLN
1000.0000 ug | Freq: Once | INTRAMUSCULAR | Status: AC
  Administered 2020-12-23: 1000 ug via INTRAMUSCULAR

## 2020-12-24 ENCOUNTER — Ambulatory Visit: Payer: Medicare Other

## 2020-12-25 ENCOUNTER — Encounter: Payer: Self-pay | Admitting: Internal Medicine

## 2020-12-25 NOTE — Progress Notes (Signed)
Outside notes received. Information abstracted. Notes sent to scan.  

## 2020-12-29 NOTE — Progress Notes (Signed)
Subjective:    Patient ID: Tami Jacobson, female    DOB: [birth year], 87 y.o.   MRN: [id number]  This visit occurred during the SARS-CoV-2 public health emergency.  Safety protocols were in place, including screening questions prior to the visit, additional usage of staff PPE, and extensive cleaning of exam room while observing appropriate contact time as indicated for disinfecting solutions.     HPI The patient is here for follow up of their chronic medical problems, including htn, dry mouth  3 weeks ago we stopped hydralazine to see if dry mouth improved.  Dry mouth not improved.  BP has been controlled at home with and without the hydralazine.    Wonders when to drink the glucerna.    Medications and allergies reviewed with patient and updated if appropriate.  Patient Active Problem List   Diagnosis Date Noted   Dry mouth 12/09/2020   Tremor 52/77/8242   Lichen sclerosus et atrophicus of the vulva 04/04/2020   Cataract 35/36/1443   Lichen sclerosus et atrophicus 04/04/2020   Mild memory disturbance 01/30/2020   Palpitations 05/23/2019   Chronic diastolic CHF (congestive heart failure) ([place]) 05/14/2019   Hypertensive urgency 05/14/2019   Mild tricuspid regurgitation 05/13/2019   LVH (left ventricular hypertrophy), mild 05/13/2019   Genetic testing 08/30/2017   Depression 06/01/2017   Family history of breast cancer    Family history of colon cancer    Family history of pancreatic cancer    Abnormal electrocardiogram 05/05/2015   BPPV (benign paroxysmal positional vertigo) 10/01/2014   Murmur 04/18/2014   Anal fissure 12/16/2011   Fibrocystic breast changes. Left.  Biopsy 07/02/2011. 07/14/2011   History of breast cancer, Right, T1c, N0, lumpectomy 06/26/2008.   Left, T1c, N0, lumpectomy 06/24/2006. 05/12/2011   Diabetes mellitus type 2, controlled ([place]) 06/11/2009   PREMATURE VENTRICULAR CONTRACTIONS 08/19/2008   B12 deficiency 08/29/2007   ESOPHAGEAL  STRICTURE 04/27/2007   GERD (gastroesophageal reflux disease) 04/27/2007   DIVERTICULOSIS, COLON 04/27/2007   OTHER ALOPECIA 02/14/2007   Dyslipidemia 01/02/2007   Essential hypertension 01/02/2007   Carotid stenosis 08/22/2006    Current Outpatient Medications on File Prior to Visit  Medication Sig Dispense Refill   Accu-Chek Softclix Lancets lancets Use as instructed 100 each 4   acetaminophen (TYLENOL) 500 MG tablet Take 500 mg by mouth every 6 (six) hours as needed.     blood glucose meter kit and supplies KIT Dispense based on patient and insurance preference. Check sugars once daily and as needed as directed. 1 each 0   clobetasol cream (TEMOVATE) 1.54 % Apply 1 application topically as needed (for skin irritation- to affected areas).      cyanocobalamin (,VITAMIN B-12,) 1000 MCG/ML injection Inject 1,000 mcg into the muscle every 30 (thirty) days.     flecainide (TAMBOCOR) 50 MG tablet TAKE 1 TABLET BY MOUTH  TWICE DAILY 180 tablet 3   glucose blood (ACCU-CHEK GUIDE) test strip USE AS DIRECTED 100 strip 4   hydrALAZINE (APRESOLINE) 10 MG tablet Take 10 mg by mouth 3 (three) times daily.     losartan (COZAAR) 100 MG tablet TAKE 1 TABLET BY MOUTH  DAILY 90 tablet 3   metFORMIN (GLUCOPHAGE-XR) 500 MG 24 hr tablet TAKE 1 TABLET BY MOUTH ONCE DAILY WITH BREAKFAST 90 tablet 3   metoprolol succinate (TOPROL-XL) 50 MG 24 hr tablet Take 1.5 tablets (75 mg total) by mouth daily. Take with or immediately following a meal. 135 tablet 3   omeprazole (  PRILOSEC) 40 MG capsule TAKE 1 CAPSULE BY MOUTH  DAILY 90 capsule 3   tamoxifen (NOLVADEX) 20 MG tablet TAKE 1 TABLET BY MOUTH  DAILY 90 tablet 3   terconazole (TERAZOL 7) 0.4 % vaginal cream Place 1 applicator vaginally at bedtime.     Current Facility-Administered Medications on File Prior to Visit  Medication Dose Route Frequency Provider Last Rate Last Admin   cyanocobalamin ((VITAMIN B-12)) injection 1,000 mcg  1,000 mcg Intramuscular Q30 days  Binnie Rail, MD   1,000 mcg at 10/20/20 1349    Past Medical History:  Diagnosis Date   Arthritis    left hip   Breast cancer, right ([place]) 11/2016   Family history of breast cancer    Family history of colon cancer    Family history of pancreatic cancer    GERD (gastroesophageal reflux disease)    Non-insulin dependent type 2 diabetes mellitus ([place])    PAC (premature atrial contraction)    Personal history of radiation therapy 12/2016   Premature ventricular contraction    Seasonal allergies    Sensitive skin    Vitamin B12 deficiency     Past Surgical History:  Procedure Laterality Date   ABDOMINAL HYSTERECTOMY  1979   partial   BREAST BIOPSY  07/02/2011   Procedure: BREAST BIOPSY WITH NEEDLE LOCALIZATION;  Surgeon: Shann Medal, MD;  Location: [facility];  Service: General;  Laterality: Left;  left breast atypical hyperplasia needle localization biopsy   BREAST BIOPSY Left 06/13/2006   BREAST BIOPSY Right 06/27/2008   BREAST BIOPSY Right 09/27/2016   BREAST EXCISIONAL BIOPSY Left 07/02/2011   BREAST LUMPECTOMY Left 07/04/2006   BREAST LUMPECTOMY Right 06/26/2008   BREAST LUMPECTOMY Right 09/27/2016   BREAST LUMPECTOMY WITH RADIOACTIVE SEED LOCALIZATION Right 11/18/2016   Procedure: RIGHT BREAST LUMPECTOMY WITH RADIOACTIVE SEED LOCALIZATION ERAS PATHWAY;  Surgeon: Alphonsa Overall, MD;  Location: [facility];  Service: General;  Laterality: Right;  ERAS PATHWAY   CATARACT EXTRACTION Bilateral    CHOLECYSTECTOMY, LAPAROSCOPIC  07/07/2001   had appendex taking out at the same time   ESOPHAGEAL DILATION  2006   LAPAROSCOPIC APPENDECTOMY  07/07/2001   TONSILLECTOMY  1942    Social History   Socioeconomic History   Marital status: Divorced    Spouse name: Marcello Moores   Number of children: 1   Years of education: Not on file   Highest education level: Not on file  Occupational History   Occupation: retire  Tobacco Use   Smoking status: Never   Smokeless tobacco:  Never  Vaping Use   Vaping Use: Never used  Substance and Sexual Activity   Alcohol use: No    Alcohol/week: 0.0 standard drinks   Drug use: No   Sexual activity: Not Currently  Other Topics Concern   Not on file  Social History Narrative   Not on file   Social Determinants of Health   Financial Resource Strain: Low Risk    Difficulty of Paying Living Expenses: Not hard at all  Food Insecurity: No Food Insecurity   Worried About Charity fundraiser in the Last Year: Never true   Arboriculturist in the Last Year: Never true  Transportation Needs: No Transportation Needs   Lack of Transportation (Medical): No   Lack of Transportation (Non-Medical): No  Physical Activity: Inactive   Days of Exercise per Week: 0 days   Minutes of Exercise per Session: 0 min  Stress: No Stress Concern Present  Feeling of Stress : Not at all  Social Connections: Not on file    Family History  Problem Relation Age of Onset   Colon cancer Mother 53   Heart attack Mother    Breast cancer Sister 33   Heart disease Maternal Uncle        CABG   Breast cancer Sister 46   Heart disease Brother    Other Father        suicide   Breast cancer Sister 24   Heart disease Sister    COPD Brother    Heart disease Brother    Breast cancer Other 70       niece - brother's daughter   Pancreatic cancer Other        brother's son, dx in his 92s    Review of Systems  Constitutional:  Negative for fever.  Respiratory:  Negative for shortness of breath.   Cardiovascular:  Positive for chest pain (occ Right sided chest pain) and palpitations (rare). Negative for leg swelling.  Neurological:  Positive for light-headedness (occ). Negative for headaches.      Objective:   Vitals:   12/30/20 1342  BP: 130/70  Pulse: 65  Temp: 98.1 F (36.7 C)  SpO2: 97%   BP Readings from Last 3 Encounters:  12/30/20 130/70  12/09/20 120/64  11/04/20 (!) 156/44   Wt Readings from Last 3 Encounters:  12/30/20  127 lb (57.6 kg)  12/09/20 127 lb 6.4 oz (57.8 kg)  11/04/20 125 lb 4.8 oz (56.8 kg)   Body mass index is 22.5 kg/m.   Physical Exam    Constitutional: Appears well-developed and well-nourished. No distress.  HENT:  Head: Normocephalic and atraumatic.  Neck: Neck supple. No tracheal deviation present. No thyromegaly present.  No cervical lymphadenopathy Cardiovascular: Normal rate, regular rhythm and normal heart sounds.  3/6 sys murmur heard.  No edema Pulmonary/Chest: Effort normal and breath sounds normal. No respiratory distress. No has no wheezes. No rales.  Skin: Skin is warm and dry. Not diaphoretic.  Psychiatric: Normal mood and affect. Behavior is normal.      Assessment & Plan:    See Problem List for Assessment and Plan of chronic medical problems.

## 2020-12-29 NOTE — Progress Notes (Signed)
b12 Injection given.   Tami Benyo [name], MD  

## 2020-12-30 ENCOUNTER — Ambulatory Visit (INDEPENDENT_AMBULATORY_CARE_PROVIDER_SITE_OTHER): Payer: Medicare Other | Admitting: Internal Medicine

## 2020-12-30 ENCOUNTER — Other Ambulatory Visit: Payer: Self-pay

## 2020-12-30 ENCOUNTER — Encounter: Payer: Self-pay | Admitting: Internal Medicine

## 2020-12-30 VITALS — BP 130/70 | HR 65 | Temp 98.1°F | Ht 63.0 in | Wt 127.0 lb

## 2020-12-30 DIAGNOSIS — I10 Essential (primary) hypertension: Secondary | ICD-10-CM

## 2020-12-30 DIAGNOSIS — R682 Dry mouth, unspecified: Secondary | ICD-10-CM

## 2020-12-30 NOTE — Assessment & Plan Note (Addendum)
No improvement with stopping hydralazine ? Related to metoprolol or metformin using biotene mouth wash - it helps a little

## 2020-12-30 NOTE — Patient Instructions (Addendum)
      Medications changes include :   stop hydralazine and continue to monitor your BP.

## 2020-12-30 NOTE — Assessment & Plan Note (Signed)
Chronic BP well controlled Continue metoprolol xl 75 mg daily, losartan 100 mg daily Stop hydralazine

## 2021-01-06 DIAGNOSIS — D044 Carcinoma in situ of skin of scalp and neck: Secondary | ICD-10-CM | POA: Diagnosis not present

## 2021-01-22 ENCOUNTER — Other Ambulatory Visit: Payer: Self-pay

## 2021-01-22 ENCOUNTER — Ambulatory Visit (INDEPENDENT_AMBULATORY_CARE_PROVIDER_SITE_OTHER): Payer: Medicare Other

## 2021-01-22 ENCOUNTER — Ambulatory Visit: Payer: Medicare Other

## 2021-01-22 DIAGNOSIS — E538 Deficiency of other specified B group vitamins: Secondary | ICD-10-CM

## 2021-01-22 NOTE — Progress Notes (Signed)
B12 given. Please co-sign.  Zuzu Befort, CMA 

## 2021-02-19 ENCOUNTER — Telehealth: Payer: Self-pay

## 2021-02-19 NOTE — Progress Notes (Signed)
Chronic Care Management Pharmacy Assistant   Name: Tami Jacobson  MRN: [id number] DOB: 1927-09-13   Reason for Encounter: Disease State   Conditions to be addressed/monitored: DMII   Recent office visits:  12/30/20 Binnie Rail, MD-pcp (Essential hypertension)  Recent consult visits:  None ID  Hospital visits:  None in previous 6 months  Medications: Outpatient Encounter Medications as of 02/19/2021  Medication Sig   Accu-Chek Softclix Lancets lancets Use as instructed   acetaminophen (TYLENOL) 500 MG tablet Take 500 mg by mouth every 6 (six) hours as needed.   blood glucose meter kit and supplies KIT Dispense based on patient and insurance preference. Check sugars once daily and as needed as directed.   clobetasol cream (TEMOVATE) 0.88 % Apply 1 application topically as needed (for skin irritation- to affected areas).    cyanocobalamin (,VITAMIN B-12,) 1000 MCG/ML injection Inject 1,000 mcg into the muscle every 30 (thirty) days.   flecainide (TAMBOCOR) 50 MG tablet TAKE 1 TABLET BY MOUTH  TWICE DAILY   glucose blood (ACCU-CHEK GUIDE) test strip USE AS DIRECTED   losartan (COZAAR) 100 MG tablet TAKE 1 TABLET BY MOUTH  DAILY   metFORMIN (GLUCOPHAGE-XR) 500 MG 24 hr tablet TAKE 1 TABLET BY MOUTH ONCE DAILY WITH BREAKFAST   metoprolol succinate (TOPROL-XL) 50 MG 24 hr tablet Take 1.5 tablets (75 mg total) by mouth daily. Take with or immediately following a meal.   omeprazole (PRILOSEC) 40 MG capsule TAKE 1 CAPSULE BY MOUTH  DAILY   tamoxifen (NOLVADEX) 20 MG tablet TAKE 1 TABLET BY MOUTH  DAILY   terconazole (TERAZOL 7) 0.4 % vaginal cream Place 1 applicator vaginally at bedtime.   Facility-Administered Encounter Medications as of 02/19/2021  Medication   cyanocobalamin ((VITAMIN B-12)) injection 1,000 mcg   Recent Relevant Labs: Lab Results  Component Value Date/Time   HGBA1C 6.9 (H) 09/04/2020 11:59 AM   HGBA1C 6.7 (H) 02/07/2020 09:03 AM   MICROALBUR 1.0  08/23/2018 11:51 AM   MICROALBUR 0.8 09/21/2016 09:46 AM    Kidney Function Lab Results  Component Value Date/Time   CREATININE 1.19 (H) 11/04/2020 10:36 AM   CREATININE 1.00 09/04/2020 11:59 AM   CREATININE 0.96 02/07/2020 09:03 AM   CREATININE 1.20 (H) 11/16/2019 01:02 PM   CREATININE 1.1 02/28/2017 02:53 PM   CREATININE 0.9 07/13/2013 08:54 AM   GFR 49.47 (L) 09/04/2020 11:59 AM   GFRNONAA 43 (L) 11/04/2020 10:36 AM   GFRAA 46 (L) 10/22/2019 01:07 PM    Current antihyperglycemic regimen:  Metformin 500 mg 1 tab daily What recent interventions/DTPs have been made to improve glycemic control:  None noted Have there been any recent hospitalizations or ED visits since last visit with CPP? No Patient denies hypoglycemic symptoms, including None Patient denies hyperglycemic symptoms, including none How often are you checking your blood sugar? Patient states that she does not check blood sugar daily and does not remember her last reading What are your blood sugars ranging? Patient believes her blood sugar is in normal range During the week, how often does your blood glucose drop below 70? Never Are you checking your feet daily/regularly? Patient states that she does not have any issues with her feet  Adherence Review: Is the patient currently on a STATIN medication? No Is the patient currently on ACE/ARB medication? Yes Does the patient have >5 day gap between last estimated fill dates? Yes   Care Gaps: Colonoscopy-NA Diabetic Foot Exam-12/17/20 Mammogram-NA Ophthalmology-11/26/20 Dexa Scan -NA  Annual  Well Visit - NA Micro albumin-NA Hemoglobin A1c- 09/04/20  Star Rating Drugs: Losartan 100 mg-last fill 10/27/20  Ethelene Hal Clinical Pharmacist Assistant [phone]

## 2021-02-23 ENCOUNTER — Other Ambulatory Visit: Payer: Self-pay

## 2021-02-23 ENCOUNTER — Ambulatory Visit (INDEPENDENT_AMBULATORY_CARE_PROVIDER_SITE_OTHER): Payer: Medicare Other

## 2021-02-23 DIAGNOSIS — E538 Deficiency of other specified B group vitamins: Secondary | ICD-10-CM

## 2021-02-23 MED ORDER — CYANOCOBALAMIN 1000 MCG/ML IJ SOLN
1000.0000 ug | Freq: Once | INTRAMUSCULAR | Status: AC
  Administered 2021-02-23: 1000 ug via INTRAMUSCULAR

## 2021-02-23 NOTE — Progress Notes (Signed)
Pt given B12 w/o any complications. °

## 2021-02-24 ENCOUNTER — Telehealth: Payer: Medicare Other

## 2021-03-09 ENCOUNTER — Other Ambulatory Visit: Payer: Self-pay

## 2021-03-09 ENCOUNTER — Ambulatory Visit: Payer: Medicare Other

## 2021-03-09 DIAGNOSIS — E118 Type 2 diabetes mellitus with unspecified complications: Secondary | ICD-10-CM

## 2021-03-09 DIAGNOSIS — I10 Essential (primary) hypertension: Secondary | ICD-10-CM

## 2021-03-09 NOTE — Progress Notes (Signed)
Chronic Care Management Pharmacy Note  03/09/2021 Name:  Tami Jacobson MRN:  [id number] DOB:  10/07/1927  Summary: -Patient reports that since stopping hydralazine - BP remains under control (averaging 115-140/70s) - notes that dry mouth has not seemed to improve - gets mild relief from dry mouth through mints and biotene mouth rinse  -Patient notes that she does not drink as many fluids as she should throughout the day, eats salty foods throughout the day as well  -Blood sugars have been averaging in the 170's - notes she is aware of how her diet impacts her blood sugars   Recommendations/Changes made from today's visit: -Recommended to patient to hydrate more throughout the day, also encouraged patient to reduce sodium intake to help decrease dry mouth / better control BP -Advised for patient to continue monitoring BG - watch carb intake, goal BG <150 to keep A1c at goal  Plan: F/u in 4 months   Subjective: Tami Jacobson is an 87 y.o. year old female who is a primary patient of Burns, Claudina Lick, MD.  The CCM team was consulted for assistance with disease management and care coordination needs.    Engaged with patient by telephone for follow up visit in response to provider referral for pharmacy case management and/or care coordination services.   Consent to Services:  The patient was given the following information about Chronic Care Management services today, agreed to services, and gave verbal consent: 1. CCM service includes personalized support from designated clinical staff supervised by the primary care provider, including individualized plan of care and coordination with other care providers 2. 24/7 contact phone numbers for assistance for urgent and routine care needs. 3. Service will only be billed when office clinical staff spend 20 minutes or more in a month to coordinate care. 4. Only one practitioner may furnish and bill the service in a calendar month. 5.The  patient may stop CCM services at any time (effective at the end of the month) by phone call to the office staff. 6. The patient will be responsible for cost sharing (co-pay) of up to 20% of the service fee (after annual deductible is met). Patient agreed to services and consent obtained.  Patient Care Team: Binnie Rail, MD as PCP - General (Internal Medicine) Stanford Breed Denice Bors, MD as Consulting Physician (Cardiology) Magrinat, Virgie Dad, MD as Consulting Physician (Hematology and Oncology) Newton Pigg, MD as Consulting Physician (Obstetrics and Gynecology) Alphonsa Overall, MD as Consulting Physician (General Surgery) Rozetta Nunnery, MD as Consulting Physician (Otolaryngology) Kyung Rudd, MD as Consulting Physician (Radiation Oncology) Delice Bison, Charlestine Massed, NP as Nurse Practitioner (Hematology and Oncology) Luberta Mutter, MD as Consulting Physician (Ophthalmology) Charlton Haws, [facility] as Pharmacist (Pharmacist)  Recent office visits: 12/30/2020 - Dr. Quay Burow - f/u - hydralazine stopped  12/09/2020 - Dr. Quay Burow - evaluation of dry mouth - reduce hydralazine to 49m TID   Recent consult visits: 12/17/2020 - Dr. EAmalia Hailey- Podiatry - routine foot care  12/11/2020 [facility] follow up for history of breast cancer - f/u in 1 year  11/04/2020 - Dr. MJana Hakim- Oncology - continue tamoxifen through December 2023 - follow up in 1 year   Hospital visits: None in previous 6 months  Objective:  Lab Results  Component Value Date   CREATININE 1.19 (H) 11/04/2020   BUN 11 11/04/2020   GFR 49.47 (L) 09/04/2020   GFRNONAA 43 (L) 11/04/2020   GFRAA 46 (L)  10/22/2019   NA 139 11/04/2020   K 4.3 11/04/2020   CALCIUM 9.9 11/04/2020   CO2 28 11/04/2020   GLUCOSE 220 (H) 11/04/2020    Lab Results  Component Value Date/Time   HGBA1C 6.9 (H) 09/04/2020 11:59 AM   HGBA1C 6.7 (H) 02/07/2020 09:03 AM   GFR 49.47 (L) 09/04/2020 11:59 AM   GFR 52.16 (L)  02/07/2020 09:03 AM   MICROALBUR 1.0 08/23/2018 11:51 AM   MICROALBUR 0.8 09/21/2016 09:46 AM    Last diabetic Eye exam:  Lab Results  Component Value Date/Time   HMDIABEYEEXA No Retinopathy 11/26/2020 12:00 AM    Last diabetic Foot exam:  No results found for: HMDIABFOOTEX   Lab Results  Component Value Date   CHOL 173 02/22/2019   HDL 50.40 02/22/2019   LDLCALC 92 02/22/2019   LDLDIRECT 128.3 12/26/2012   TRIG 157.0 (H) 02/22/2019   CHOLHDL 3 02/22/2019    Hepatic Function Latest Ref Rng & Units 11/04/2020 09/04/2020 02/07/2020  Total Protein 6.5 - 8.1 g/dL 6.8 6.4 6.4  Albumin 3.5 - 5.0 g/dL 3.8 4.0 3.8  AST 15 - 41 U/L '17 16 15  ' ALT 0 - 44 U/L '14 12 13  ' Alk Phosphatase 38 - 126 U/L 45 47 43  Total Bilirubin 0.3 - 1.2 mg/dL 0.7 0.5 0.5  Bilirubin, Direct 0.0 - 0.2 mg/dL - - -    Lab Results  Component Value Date/Time   TSH 2.519 05/15/2019 05:31 AM   TSH 1.90 08/23/2018 11:51 AM   TSH 1.54 08/20/2015 09:49 AM    CBC Latest Ref Rng & Units 11/04/2020 09/04/2020 02/07/2020  WBC 4.0 - 10.5 K/uL 4.4 5.4 4.3  Hemoglobin 12.0 - 15.0 g/dL 11.4(L) 10.7(L) 11.2(L)  Hematocrit 36.0 - 46.0 % 33.8(L) 31.3(L) 33.2(L)  Platelets 150 - 400 K/uL 128(L) 127.0(L) 130.0(L)    Lab Results  Component Value Date/Time   VD25OH 35 10/07/2009 01:56 PM   VD25OH 31 10/09/2008 01:41 PM    Clinical ASCVD: No  The ASCVD Risk score (Arnett DK, et al., 2019) failed to calculate for the following reasons:   The 2019 ASCVD risk score is only valid for ages 68 to 63    Depression screen PHQ 2/9 05/06/2020 02/07/2020 10/03/2018  Decreased Interest 0 0 2  Down, Depressed, Hopeless 0 1 2  PHQ - 2 Score 0 1 4  Altered sleeping - 0 0  Tired, decreased energy - 1 2  Change in appetite - 1 2  Feeling bad or failure about yourself  - 0 2  Trouble concentrating - 0 0  Moving slowly or fidgety/restless - 0 0  Suicidal thoughts - 0 0  PHQ-9 Score - 3 10  Difficult doing work/chores - Not difficult at all  -  Some recent data might be hidden    Social History   Tobacco Use  Smoking Status Never  Smokeless Tobacco Never   BP Readings from Last 3 Encounters:  12/30/20 130/70  12/09/20 120/64  11/04/20 (!) 156/44   Pulse Readings from Last 3 Encounters:  12/30/20 65  12/09/20 65  11/04/20 65   Wt Readings from Last 3 Encounters:  12/30/20 127 lb (57.6 kg)  12/09/20 127 lb 6.4 oz (57.8 kg)  11/04/20 125 lb 4.8 oz (56.8 kg)   BMI Readings from Last 3 Encounters:  12/30/20 22.50 kg/m  12/09/20 22.57 kg/m  11/04/20 22.20 kg/m    Assessment/Interventions: Review of patient past medical history, allergies, medications, health status, including review  of consultants reports, laboratory and other test data, was performed as part of comprehensive evaluation and provision of chronic care management services.   SDOH:  (Social Determinants of Health) assessments and interventions performed: Yes  SDOH Screenings   Alcohol Screen: Low Risk    Last Alcohol Screening Score (AUDIT): 0  Depression (PHQ2-9): Low Risk    PHQ-2 Score: 0  Financial Resource Strain: Low Risk    Difficulty of Paying Living Expenses: Not hard at all  Food Insecurity: No Food Insecurity   Worried About Charity fundraiser in the Last Year: Never true   Ran Out of Food in the Last Year: Never true  Housing: Low Risk    Last Housing Risk Score: 0  Physical Activity: Inactive   Days of Exercise per Week: 0 days   Minutes of Exercise per Session: 0 min  Social Connections: Not on file  Stress: No Stress Concern Present   Feeling of Stress : Not at all  Tobacco Use: Low Risk    Smoking Tobacco Use: Never   Smokeless Tobacco Use: Never   Passive Exposure: Not on file  Transportation Needs: No Transportation Needs   Lack of Transportation (Medical): No   Lack of Transportation (Non-Medical): No    CCM Care Plan  Allergies  Allergen Reactions   Statins Other (See Comments)    GI UPSET   Effexor  [Venlafaxine]     Constipation, shaking, dizzy   Cephalexin Other (See Comments)    Reaction not recalled by the patient   Codeine Other (See Comments)    Reaction not recalled by the patient   Lansoprazole Other (See Comments)    Reaction not recalled by the patient   Latex Rash        Levofloxacin Other (See Comments)    Reaction not recalled by the patient   Nitrofurantoin Other (See Comments)    Reaction not recalled by the patient   Rofecoxib Other (See Comments)    Reaction not recalled by the patient     Medications Reviewed Today     Reviewed by Tomasa Blase, [facility] (Pharmacist) on 03/09/21 at [place] List Status: <None>   Medication Order Taking? Sig Documenting Provider Last Dose Status Informant  Accu-Chek Softclix Lancets lancets [id number] Yes Use as instructed Burns, Claudina Lick, MD Taking Active   acetaminophen (TYLENOL) 500 MG tablet [id number] Yes Take 500 mg by mouth every 6 (six) hours as needed. [provider] Taking Active   blood glucose meter kit and supplies KIT [id number] Yes Dispense based on patient and insurance preference. Check sugars once daily and as needed as directed. Binnie Rail, MD Taking Active Self  clobetasol cream (TEMOVATE) 0.05 % [id number] Yes Apply 1 application topically as needed (for skin irritation- to affected areas).  [provider] Taking Active Self  cyanocobalamin ((VITAMIN B-12)) injection 1,000 mcg [id number]   Binnie Rail, MD  Active   cyanocobalamin (,VITAMIN B-12,) 1000 MCG/ML injection [id number] Yes Inject 1,000 mcg into the muscle every 30 (thirty) days. [provider] Taking Active Self  flecainide (TAMBOCOR) 50 MG tablet [id number] Yes TAKE 1 TABLET BY MOUTH  TWICE DAILY Crenshaw, Denice Bors, MD Taking Active   glucose blood (ACCU-CHEK GUIDE) test strip [id number] Yes USE AS DIRECTED Burns, Claudina Lick, MD Taking Active   Ibuprofen-diphenhydrAMINE HCl (IBUPROFEN PM) 200-25 MG CAPS [id number] Yes Take 1  capsule by mouth at bedtime as needed. Reports to using no more than once weekly  [provider]  Active   losartan (COZAAR) 100 MG tablet [id number] Yes TAKE 1 TABLET BY MOUTH  DAILY Burns, Claudina Lick, MD Taking Active   metFORMIN (GLUCOPHAGE-XR) 500 MG 24 hr tablet [id number] Yes TAKE 1 TABLET BY MOUTH ONCE DAILY WITH BREAKFAST Burns, Claudina Lick, MD Taking Active   metoprolol succinate (TOPROL-XL) 50 MG 24 hr tablet [id number] Yes Take 1.5 tablets (75 mg total) by mouth daily. Take with or immediately following a meal. Lelon Perla, MD Taking Active   omeprazole (PRILOSEC) 40 MG capsule [id number] Yes TAKE 1 CAPSULE BY MOUTH  DAILY Burns, Claudina Lick, MD Taking Active   tamoxifen (NOLVADEX) 20 MG tablet [id number] Yes TAKE 1 TABLET BY MOUTH  DAILY Magrinat, Virgie Dad, MD Taking Active   terconazole (TERAZOL 7) 0.4 % vaginal cream [id number]  Place 1 applicator vaginally at bedtime. [provider]  Active             Patient Active Problem List   Diagnosis Date Noted   Dry mouth 12/09/2020   Tremor 68/03/7516   Lichen sclerosus et atrophicus of the vulva 04/04/2020   Cataract 00/17/4944   Lichen sclerosus et atrophicus 04/04/2020   Mild memory disturbance 01/30/2020   Palpitations 05/23/2019   Chronic diastolic CHF (congestive heart failure) ([place]) 05/14/2019   Mild tricuspid regurgitation 05/13/2019   LVH (left ventricular hypertrophy), mild 05/13/2019   Genetic testing 08/30/2017   Depression 06/01/2017   Family history of breast cancer    Family history of colon cancer    Family history of pancreatic cancer    Abnormal electrocardiogram 05/05/2015   BPPV (benign paroxysmal positional vertigo) 10/01/2014   Murmur 04/18/2014   Anal fissure 12/16/2011   Fibrocystic breast changes. Left.  Biopsy 07/02/2011. 07/14/2011   History of breast cancer, Right, T1c, N0, lumpectomy 06/26/2008.   Left, T1c, N0, lumpectomy 06/24/2006. 05/12/2011   Diabetes mellitus type 2, controlled  ([place]) 06/11/2009   PREMATURE VENTRICULAR CONTRACTIONS 08/19/2008   B12 deficiency 08/29/2007   ESOPHAGEAL STRICTURE 04/27/2007   GERD (gastroesophageal reflux disease) 04/27/2007   DIVERTICULOSIS, COLON 04/27/2007   OTHER ALOPECIA 02/14/2007   Dyslipidemia 01/02/2007   Essential hypertension 01/02/2007   Carotid stenosis 08/22/2006    Immunization History  Administered Date(s) Administered   Fluad Quad(high Dose 65+) 01/04/2019, 01/07/2020, 12/09/2020   Influenza Split 12/14/2011   Influenza Whole 02/01/2007, 01/03/2009, 12/15/2010   Influenza, High Dose Seasonal PF 01/01/2014, 12/25/2014, 12/15/2015, 12/24/2016, 12/27/2017   Influenza,inj,Quad PF,6+ Mos 12/26/2012   Moderna Sars-Covid-2 Vaccination 06/03/2019, 06/30/2019, 03/07/2020   Pneumococcal Conjugate-13 07/02/2014   Pneumococcal Polysaccharide-23 02/16/2002, 02/09/2012   Td 06/10/2009    Conditions to be addressed/monitored:  Hypertension, Diabetes, Heart Failure, and Chronic Kidney Disease  Care Plan : [place]  Updates made by Tomasa Blase, RPH since 03/09/2021 12:00 AM     Problem: HTN, CHF, DM, CKD   Priority: High  Onset Date: 03/09/2021     Long-Range Goal: Disease Management   Start Date: 03/09/2021  Expected End Date: 03/09/2022  This Visit's Progress: On track  Priority: High  Note:   Current Barriers:  Chronic Disease Management support, education, and care coordination needs related to Hypertension, Diabetes, Heart Failure, and Chronic Kidney Disease   Hypertension / Heart failure / PVCs (BP goal < 140/90) -Controlled  BP Readings from Last 3 Encounters:  12/30/20 130/70  12/09/20 120/64  11/04/20 (!) 156/44  Current regimen:  Metoprolol succinate 75 mg daily Losartan 100 mg daily Flecainide  75m - 1 tablet twice daily  Home BP's - patient reports to checking blood pressures daily - typically averaging 115-140's/70's - no issues with hypotension Interventions: Discussed BP  goals and benefits of medication for prevention of heart attack / stroke Patient self care activities, patient will: Check BP daily, document, and provide at future appointments Ensure daily salt intake < 2300 mg/day  Diabetes (A1c goal <7.0%) Lab Results  Component Value Date   HGBA1C 6.9 (H) 09/04/2020  Current regimen:  Metformin XR 500 mg daily Home blood sugars - notes that she checks every other day - averaging ~170's - does note that she does not watch diet as closely as she should - can be elevated at times based on food she has eaten  Interventions: Discussed blood sugar goals and benefits of medication for prevention of diabetic complications Discussed prednisone can cause high blood sugar, but sugars should return to normal once steroid course is completed Patient self care activities, patient will: Check blood sugar once daily and in the morning before eating or drinking, document, and provide at future appointments Contact provider with any episodes of hypoglycemia  Chronic kidney disease (goal - prevention of disease progression) Current regimen:  Losartan 100 mg daily Interventions: Discussed causes of kidney disease including aging, high blood pressure, and diabetes Discussed importance of hydration/ avoidance of NSAID use  Patient self care activities, patient will: Drink at least 48 oz of water per day  /avoid excessive sodium intake   Medication management Current pharmacy: [facility] Interventions Comprehensive medication review performed. Continue current medication management strategy Patient self care activities patient will: Focus on medication adherence by fill date Take medications as prescribed Check blood pressures and blood sugars daily  Increase fluid intake , decrease sodium intake  Report any questions or concerns to PharmD and/or provider(s)    Medication Assistance: None required.  Patient affirms current coverage meets  needs.  Compliance/Adherence/Medication fill history: Care Gaps: Shingles, Tdap, COVID vaccine   Patient's preferred pharmacy is:  [facility] - [place] [place]Phone: [phone]: [phone] [facility] Hospital[facility] [address]Phone: [phone]: [phone] Uses pill box? Yes Pt endorses 100% compliance  Care Plan and Follow Up Patient Decision:  Patient agrees to Care Plan and Follow-up.  Plan: Telephone follow up appointment with care management team member scheduled for:  4 months  The patient has been provided with contact information for the care management team and has been advised to call with any health related questions or concerns.   DTomasa Blase PharmD Clinical Pharmacist, [place]

## 2021-03-09 NOTE — Patient Instructions (Signed)
Visit Information  Following are the goals we discussed today:   Manage My Medications   Timeframe:  Long-Range Goal Priority:  Medium Start Date:  03/09/2021                           Expected End Date:  03/09/2022                     Follow Up Date 09/07/2021   - call for medicine refill 2 or 3 days before it runs out - call if I am sick and can't take my medicine - keep a list of all the medicines I take; vitamins and herbals too - learn to read medicine labels    Why is this important?   These steps will help you keep on track with your medicines.  Plan: Telephone follow up appointment with care management team member scheduled for:  4 months  The patient has been provided with contact information for the care management team and has been advised to call with any health related questions or concerns.   Tomasa Blase, PharmD Clinical Pharmacist, [facility]    Please call the care guide team at [phone] if you need to cancel or reschedule your appointment.   The patient verbalized understanding of instructions, educational materials, and care plan provided today and declined offer to receive copy of patient instructions, educational materials, and care plan.

## 2021-03-10 NOTE — Patient Instructions (Addendum)
    Blood work was ordered.     Medications changes include :  none     Dr Carles Collet with [facility] would be the best person to see for your tremor if you want it evaluated further.    Please followup in 6 months

## 2021-03-10 NOTE — Progress Notes (Signed)
Subjective:    Patient ID: Tami Jacobson, female    DOB: Jul 11, 1927, 87 y.o.   MRN: [id number]  This visit occurred during the SARS-CoV-2 public health emergency.  Safety protocols were in place, including screening questions prior to the visit, additional usage of staff PPE, and extensive cleaning of exam room while observing appropriate contact time as indicated for disinfecting solutions.     HPI The patient is here for follow up of their chronic medical problems, including htn, DM, gerd, B12 def  She still has the tremor in her hands.  She notices it most when she is sitting still.  She can only write for so long and then she gets shaky and has to stop.   It is hard to hold the spoon still when eating.     She has dry mouth.  She uses biotene.  It was better today.  She ate something different for breakfast but she denies any other changes.  She did take her medication.  Sugar at home has been high at times and she is not sure why.  Medications and allergies reviewed with patient and updated if appropriate.  Patient Active Problem List   Diagnosis Date Noted   Dry mouth 12/09/2020   Tremor 92/42/6834   Lichen sclerosus et atrophicus of the vulva 04/04/2020   Cataract 04/04/2020   Mild memory disturbance 01/30/2020   Palpitations 05/23/2019   Chronic diastolic CHF (congestive heart failure) ([place]) 05/14/2019   Mild tricuspid regurgitation 05/13/2019   LVH (left ventricular hypertrophy), mild 05/13/2019   Genetic testing 08/30/2017   Depression 06/01/2017   Family history of breast cancer    Family history of colon cancer    Family history of pancreatic cancer    Abnormal electrocardiogram 05/05/2015   BPPV (benign paroxysmal positional vertigo) 10/01/2014   Murmur 04/18/2014   Anal fissure 12/16/2011   Fibrocystic breast changes. Left.  Biopsy 07/02/2011. 07/14/2011   History of breast cancer, Right, T1c, N0, lumpectomy 06/26/2008.   Left, T1c, N0, lumpectomy  06/24/2006. 05/12/2011   Diabetes mellitus type 2, controlled ([place]) 06/11/2009   PREMATURE VENTRICULAR CONTRACTIONS 08/19/2008   B12 deficiency 08/29/2007   ESOPHAGEAL STRICTURE 04/27/2007   GERD (gastroesophageal reflux disease) 04/27/2007   DIVERTICULOSIS, COLON 04/27/2007   OTHER ALOPECIA 02/14/2007   Dyslipidemia 01/02/2007   Essential hypertension 01/02/2007   Carotid stenosis 08/22/2006    Current Outpatient Medications on File Prior to Visit  Medication Sig Dispense Refill   Accu-Chek Softclix Lancets lancets Use as instructed 100 each 4   acetaminophen (TYLENOL) 500 MG tablet Take 500 mg by mouth every 6 (six) hours as needed.     blood glucose meter kit and supplies KIT Dispense based on patient and insurance preference. Check sugars once daily and as needed as directed. 1 each 0   clobetasol cream (TEMOVATE) 1.96 % Apply 1 application topically as needed (for skin irritation- to affected areas).      cyanocobalamin (,VITAMIN B-12,) 1000 MCG/ML injection Inject 1,000 mcg into the muscle every 30 (thirty) days.     flecainide (TAMBOCOR) 50 MG tablet TAKE 1 TABLET BY MOUTH  TWICE DAILY 180 tablet 3   glucose blood (ACCU-CHEK GUIDE) test strip USE AS DIRECTED 100 strip 4   losartan (COZAAR) 100 MG tablet TAKE 1 TABLET BY MOUTH  DAILY 90 tablet 3   metFORMIN (GLUCOPHAGE-XR) 500 MG 24 hr tablet TAKE 1 TABLET BY MOUTH ONCE DAILY WITH BREAKFAST 90 tablet 3  metoprolol succinate (TOPROL-XL) 50 MG 24 hr tablet Take 1.5 tablets (75 mg total) by mouth daily. Take with or immediately following a meal. 135 tablet 3   omeprazole (PRILOSEC) 40 MG capsule TAKE 1 CAPSULE BY MOUTH  DAILY 90 capsule 3   tamoxifen (NOLVADEX) 20 MG tablet TAKE 1 TABLET BY MOUTH  DAILY 90 tablet 3   terconazole (TERAZOL 7) 0.4 % vaginal cream Place 1 applicator vaginally at bedtime.     Current Facility-Administered Medications on File Prior to Visit  Medication Dose Route Frequency Provider Last Rate Last Admin    cyanocobalamin ((VITAMIN B-12)) injection 1,000 mcg  1,000 mcg Intramuscular Q30 days Binnie Rail, MD   1,000 mcg at 01/22/21 1036    Past Medical History:  Diagnosis Date   Arthritis    left hip   Breast cancer, right ([place]) 11/2016   Family history of breast cancer    Family history of colon cancer    Family history of pancreatic cancer    GERD (gastroesophageal reflux disease)    Non-insulin dependent type 2 diabetes mellitus ([place])    PAC (premature atrial contraction)    Personal history of radiation therapy 12/2016   Premature ventricular contraction    Seasonal allergies    Sensitive skin    Vitamin B12 deficiency     Past Surgical History:  Procedure Laterality Date   ABDOMINAL HYSTERECTOMY  1979   partial   BREAST BIOPSY  07/02/2011   Procedure: BREAST BIOPSY WITH NEEDLE LOCALIZATION;  Surgeon: Shann Medal, MD;  Location: [facility];  Service: General;  Laterality: Left;  left breast atypical hyperplasia needle localization biopsy   BREAST BIOPSY Left 06/13/2006   BREAST BIOPSY Right 06/27/2008   BREAST BIOPSY Right 09/27/2016   BREAST EXCISIONAL BIOPSY Left 07/02/2011   BREAST LUMPECTOMY Left 07/04/2006   BREAST LUMPECTOMY Right 06/26/2008   BREAST LUMPECTOMY Right 09/27/2016   BREAST LUMPECTOMY WITH RADIOACTIVE SEED LOCALIZATION Right 11/18/2016   Procedure: RIGHT BREAST LUMPECTOMY WITH RADIOACTIVE SEED LOCALIZATION ERAS PATHWAY;  Surgeon: Alphonsa Overall, MD;  Location: [facility];  Service: General;  Laterality: Right;  ERAS PATHWAY   CATARACT EXTRACTION Bilateral    CHOLECYSTECTOMY, LAPAROSCOPIC  07/07/2001   had appendex taking out at the same time   ESOPHAGEAL DILATION  2006   LAPAROSCOPIC APPENDECTOMY  07/07/2001   TONSILLECTOMY  1942    Social History   Socioeconomic History   Marital status: Divorced    Spouse name: Marcello Moores   Number of children: 1   Years of education: Not on file   Highest education level: Not on file  Occupational  History   Occupation: retire  Tobacco Use   Smoking status: Never   Smokeless tobacco: Never  Vaping Use   Vaping Use: Never used  Substance and Sexual Activity   Alcohol use: No    Alcohol/week: 0.0 standard drinks   Drug use: No   Sexual activity: Not Currently  Other Topics Concern   Not on file  Social History Narrative   Not on file   Social Determinants of Health   Financial Resource Strain: Low Risk    Difficulty of Paying Living Expenses: Not hard at all  Food Insecurity: No Food Insecurity   Worried About Charity fundraiser in the Last Year: Never true   Arboriculturist in the Last Year: Never true  Transportation Needs: No Transportation Needs   Lack of Transportation (Medical): No   Lack of Transportation (  Non-Medical): No  Physical Activity: Inactive   Days of Exercise per Week: 0 days   Minutes of Exercise per Session: 0 min  Stress: No Stress Concern Present   Feeling of Stress : Not at all  Social Connections: Not on file    Family History  Problem Relation Age of Onset   Colon cancer Mother 42   Heart attack Mother    Breast cancer Sister 63   Heart disease Maternal Uncle        CABG   Breast cancer Sister 85   Heart disease Brother    Other Father        suicide   Breast cancer Sister 39   Heart disease Sister    COPD Brother    Heart disease Brother    Breast cancer Other 53       niece - brother's daughter   Pancreatic cancer Other        brother's son, dx in his 38s    Review of Systems  Constitutional:  Negative for chills and fever.  Respiratory:  Negative for cough, shortness of breath and wheezing.   Cardiovascular:  Negative for chest pain, palpitations and leg swelling.  Gastrointestinal:        Jerrye Bushy controlled  Neurological:  Positive for tremors, light-headedness (occ - with quick movements) and headaches (occ sinus headaches). Negative for dizziness.      Objective:   Vitals:   03/11/21 1112  BP: 128/78  Pulse: 78   Temp: 98.5 F (36.9 C)  SpO2: 95%   BP Readings from Last 3 Encounters:  03/11/21 128/78  12/30/20 130/70  12/09/20 120/64   Wt Readings from Last 3 Encounters:  03/11/21 128 lb (58.1 kg)  12/30/20 127 lb (57.6 kg)  12/09/20 127 lb 6.4 oz (57.8 kg)   Body mass index is 22.67 kg/m.   Physical Exam    Constitutional: Appears well-developed and well-nourished. No distress.  HENT:  Head: Normocephalic and atraumatic.  Neck: Neck supple. No tracheal deviation present. No thyromegaly present.  No cervical lymphadenopathy Cardiovascular: Normal rate, regular rhythm and normal heart sounds.   No murmur heard. No carotid bruit .  No edema Pulmonary/Chest: Effort normal and breath sounds normal. No respiratory distress. No has no wheezes. No rales.  Skin: Skin is warm and dry. Not diaphoretic.  Psychiatric: Normal mood and affect. Behavior is normal.      Assessment & Plan:    See Problem List for Assessment and Plan of chronic medical problems.

## 2021-03-11 ENCOUNTER — Encounter: Payer: Self-pay | Admitting: Internal Medicine

## 2021-03-11 ENCOUNTER — Other Ambulatory Visit: Payer: Self-pay

## 2021-03-11 ENCOUNTER — Ambulatory Visit (INDEPENDENT_AMBULATORY_CARE_PROVIDER_SITE_OTHER): Payer: Medicare Other | Admitting: Internal Medicine

## 2021-03-11 VITALS — BP 128/78 | HR 78 | Temp 98.5°F | Ht 63.0 in | Wt 128.0 lb

## 2021-03-11 DIAGNOSIS — R251 Tremor, unspecified: Secondary | ICD-10-CM | POA: Diagnosis not present

## 2021-03-11 DIAGNOSIS — E118 Type 2 diabetes mellitus with unspecified complications: Secondary | ICD-10-CM | POA: Diagnosis not present

## 2021-03-11 DIAGNOSIS — K219 Gastro-esophageal reflux disease without esophagitis: Secondary | ICD-10-CM | POA: Diagnosis not present

## 2021-03-11 DIAGNOSIS — E538 Deficiency of other specified B group vitamins: Secondary | ICD-10-CM | POA: Diagnosis not present

## 2021-03-11 DIAGNOSIS — I10 Essential (primary) hypertension: Secondary | ICD-10-CM | POA: Diagnosis not present

## 2021-03-11 LAB — CBC WITH DIFFERENTIAL/PLATELET
Basophils Absolute: 0 10*3/uL (ref 0.0–0.1)
Basophils Relative: 0.5 % (ref 0.0–3.0)
Eosinophils Absolute: 0.1 10*3/uL (ref 0.0–0.7)
Eosinophils Relative: 2.4 % (ref 0.0–5.0)
HCT: 29.9 % — ABNORMAL LOW (ref 36.0–46.0)
Hemoglobin: 10.2 g/dL — ABNORMAL LOW (ref 12.0–15.0)
Lymphocytes Relative: 15.7 % (ref 12.0–46.0)
Lymphs Abs: 1 10*3/uL (ref 0.7–4.0)
MCHC: 34 g/dL (ref 30.0–36.0)
MCV: 94.1 fl (ref 78.0–100.0)
Monocytes Absolute: 0.7 10*3/uL (ref 0.1–1.0)
Monocytes Relative: 11.1 % (ref 3.0–12.0)
Neutro Abs: 4.3 10*3/uL (ref 1.4–7.7)
Neutrophils Relative %: 70.3 % (ref 43.0–77.0)
Platelets: 184 10*3/uL (ref 150.0–400.0)
RBC: 3.18 Mil/uL — ABNORMAL LOW (ref 3.87–5.11)
RDW: 13.3 % (ref 11.5–15.5)
WBC: 6.2 10*3/uL (ref 4.0–10.5)

## 2021-03-11 LAB — COMPREHENSIVE METABOLIC PANEL
ALT: 11 U/L (ref 0–35)
AST: 13 U/L (ref 0–37)
Albumin: 3.6 g/dL (ref 3.5–5.2)
Alkaline Phosphatase: 43 U/L (ref 39–117)
BUN: 13 mg/dL (ref 6–23)
CO2: 30 mEq/L (ref 19–32)
Calcium: 9.9 mg/dL (ref 8.4–10.5)
Chloride: 100 mEq/L (ref 96–112)
Creatinine, Ser: 0.79 mg/dL (ref 0.40–1.20)
GFR: 65.4 mL/min (ref >60.00)
Glucose, Bld: 226 mg/dL — ABNORMAL HIGH (ref 70–99)
Potassium: 4.7 mEq/L (ref 3.5–5.1)
Sodium: 135 mEq/L (ref 135–145)
Total Bilirubin: 0.6 mg/dL (ref 0.2–1.2)
Total Protein: 6.7 g/dL (ref 6.0–8.3)

## 2021-03-11 LAB — HEMOGLOBIN A1C: Hgb A1c MFr Bld: 7 % — ABNORMAL HIGH (ref 4.6–6.5)

## 2021-03-11 NOTE — Assessment & Plan Note (Addendum)
Chronic On B12 injections monthly due to poor absorption B12 injection due next month continue monthly injections

## 2021-03-11 NOTE — Assessment & Plan Note (Signed)
B/l hand tremor At rest and with activity (writing and eating) Advised evaluation by neurology - she will let me know if she wants a referral

## 2021-03-11 NOTE — Assessment & Plan Note (Signed)
Chronic Blood pressure well controlled CMP Continue losartan 100 mg daily, metoprolol XL 75 mg daily

## 2021-03-11 NOTE — Assessment & Plan Note (Signed)
Chronic Controlled Lab Results  Component Value Date   HGBA1C 6.9 (H) 09/04/2020   Continue metformin X5 100 mg daily Encouraged diabetic diet

## 2021-03-11 NOTE — Assessment & Plan Note (Signed)
Chronic GERD controlled Continue omeprazole 40 mg daily 

## 2021-03-15 ENCOUNTER — Other Ambulatory Visit: Payer: Self-pay | Admitting: Oncology

## 2021-03-15 DIAGNOSIS — C50311 Malignant neoplasm of lower-inner quadrant of right female breast: Secondary | ICD-10-CM

## 2021-03-16 DIAGNOSIS — H532 Diplopia: Secondary | ICD-10-CM | POA: Diagnosis not present

## 2021-03-16 DIAGNOSIS — T1512XA Foreign body in conjunctival sac, left eye, initial encounter: Secondary | ICD-10-CM | POA: Diagnosis not present

## 2021-03-16 DIAGNOSIS — H5000 Unspecified esotropia: Secondary | ICD-10-CM | POA: Diagnosis not present

## 2021-03-16 DIAGNOSIS — L570 Actinic keratosis: Secondary | ICD-10-CM | POA: Diagnosis not present

## 2021-03-25 ENCOUNTER — Other Ambulatory Visit: Payer: Self-pay

## 2021-03-25 ENCOUNTER — Ambulatory Visit: Payer: Medicare Other

## 2021-03-25 ENCOUNTER — Ambulatory Visit (INDEPENDENT_AMBULATORY_CARE_PROVIDER_SITE_OTHER): Payer: Medicare Other

## 2021-03-25 DIAGNOSIS — E538 Deficiency of other specified B group vitamins: Secondary | ICD-10-CM | POA: Diagnosis not present

## 2021-03-25 MED ORDER — CYANOCOBALAMIN 1000 MCG/ML IJ SOLN
1000.0000 ug | Freq: Once | INTRAMUSCULAR | Status: AC
  Administered 2021-03-25: 15:00:00 1000 ug via INTRAMUSCULAR

## 2021-03-25 NOTE — Progress Notes (Signed)
Pt was given B12 w/o any complications. 

## 2021-03-27 ENCOUNTER — Ambulatory Visit: Payer: Medicare Other

## 2021-04-27 ENCOUNTER — Other Ambulatory Visit: Payer: Self-pay

## 2021-04-27 ENCOUNTER — Ambulatory Visit (INDEPENDENT_AMBULATORY_CARE_PROVIDER_SITE_OTHER): Payer: Medicare Other

## 2021-04-27 DIAGNOSIS — E538 Deficiency of other specified B group vitamins: Secondary | ICD-10-CM | POA: Diagnosis not present

## 2021-04-27 NOTE — Progress Notes (Signed)
B12 given and tolerated well °

## 2021-05-05 ENCOUNTER — Other Ambulatory Visit: Payer: Self-pay | Admitting: Internal Medicine

## 2021-05-11 ENCOUNTER — Telehealth: Payer: Self-pay

## 2021-05-11 NOTE — Chronic Care Management (AMB) (Signed)
Chronic Care Management Pharmacy Assistant   Name: Tami Jacobson  MRN: [id number] DOB: [birth year]   Reason for Encounter: Disease State   Conditions to be addressed/monitored: DMII   Recent office visits:  03/11/21 Tami Rail, MD-PCP (Essential hypertension) Blood work was ordered.   Medications changes include : none  Recent consult visits:  None ID  Hospital visits:  None in previous 6 months  Medications: Outpatient Encounter Medications as of 05/11/2021  Medication Sig   Accu-Chek Softclix Lancets lancets Use as instructed   acetaminophen (TYLENOL) 500 MG tablet Take 500 mg by mouth every 6 (six) hours as needed.   blood glucose meter kit and supplies KIT Dispense based on patient and insurance preference. Check sugars once daily and as needed as directed.   clobetasol cream (TEMOVATE) 5.86 % Apply 1 application topically as needed (for skin irritation- to affected areas).    cyanocobalamin (,VITAMIN B-12,) 1000 MCG/ML injection Inject 1,000 mcg into the muscle every 30 (thirty) days.   flecainide (TAMBOCOR) 50 MG tablet TAKE 1 TABLET BY MOUTH  TWICE DAILY   glucose blood (ACCU-CHEK GUIDE) test strip USE AS DIRECTED   losartan (COZAAR) 100 MG tablet TAKE 1 TABLET BY MOUTH  DAILY   metFORMIN (GLUCOPHAGE-XR) 500 MG 24 hr tablet TAKE 1 TABLET BY MOUTH ONCE DAILY WITH BREAKFAST   metoprolol succinate (TOPROL-XL) 50 MG 24 hr tablet Take 1.5 tablets (75 mg total) by mouth daily. Take with or immediately following a meal.   omeprazole (PRILOSEC) 40 MG capsule TAKE 1 CAPSULE BY MOUTH  DAILY   tamoxifen (NOLVADEX) 20 MG tablet TAKE 1 TABLET BY MOUTH  DAILY   terconazole (TERAZOL 7) 0.4 % vaginal cream Place 1 applicator vaginally at bedtime.   No facility-administered encounter medications on file as of 05/11/2021.   Recent Relevant Labs: Lab Results  Component Value Date/Time   HGBA1C 7.0 (H) 03/11/2021 11:59 AM   HGBA1C 6.9 (H) 09/04/2020 11:59 AM   MICROALBUR 1.0  08/23/2018 11:51 AM   MICROALBUR 0.8 09/21/2016 09:46 AM    Kidney Function Lab Results  Component Value Date/Time   CREATININE 0.79 03/11/2021 11:59 AM   CREATININE 1.19 (H) 11/04/2020 10:36 AM   CREATININE 1.00 09/04/2020 11:59 AM   CREATININE 1.20 (H) 11/16/2019 01:02 PM   CREATININE 1.1 02/28/2017 02:53 PM   CREATININE 0.9 07/13/2013 08:54 AM   GFR 65.40 03/11/2021 11:59 AM   GFRNONAA 43 (L) 11/04/2020 10:36 AM   GFRAA 46 (L) 10/22/2019 01:07 PM    Current antihyperglycemic regimen:  Metformin 500 mg daily  What recent interventions/DTPs have been made to improve glycemic control:  None noted  Have there been any recent hospitalizations or ED visits since last visit with CPP? No  Patient denies hypoglycemic symptoms, including None  Patient denies hyperglycemic symptoms, including none  How often are you checking your blood sugar? once daily, patient has not checked in a few day because batteries are dead and need to get some more  What are your blood sugars ranging? 90-150 Fasting: 155 about 5 days ago  During the week, how often does your blood glucose drop below 70? Never  Are you checking your feet daily/regularly? Patient states that she has some swelling but not as much now.  Adherence Review: Is the patient currently on a STATIN medication? No Is the patient currently on ACE/ARB medication? Yes Does the patient have >5 day gap between last estimated fill dates? No   Care Gaps: Colonoscopy-NA  Diabetic Foot Exam-12/17/20 Mammogram-NA Ophthalmology-11/26/20 Dexa Scan - NA Annual Well Visit - 05/06/20 (Medicare wellness exam) Micro albumin-NA Hemoglobin A1c- 03/11/21  Star Rating Drugs: Metformin 500 mg-last fill 03/04/21 90 ds Losartan 100 mg-last fill 04/22/21 90 ds   Ethelene Hal Clinical Pharmacist Assistant [phone]

## 2021-05-15 ENCOUNTER — Telehealth: Payer: Self-pay | Admitting: Internal Medicine

## 2021-05-15 ENCOUNTER — Ambulatory Visit: Payer: Medicare Other

## 2021-05-15 NOTE — Telephone Encounter (Signed)
Left message for patient to call back to schedule Medicare Annual Wellness Visit  ° °Last AWV  05/06/20 ° °Please schedule at anytime with [facility]-Nurse Health Advisor if patient calls the office back.   ° °40 Minutes appointment  ° °Any questions, please call me at [phone]  °

## 2021-05-21 DIAGNOSIS — H16223 Keratoconjunctivitis sicca, not specified as Sjogren's, bilateral: Secondary | ICD-10-CM | POA: Diagnosis not present

## 2021-05-21 DIAGNOSIS — H02401 Unspecified ptosis of right eyelid: Secondary | ICD-10-CM | POA: Diagnosis not present

## 2021-05-21 DIAGNOSIS — S0501XA Injury of conjunctiva and corneal abrasion without foreign body, right eye, initial encounter: Secondary | ICD-10-CM | POA: Diagnosis not present

## 2021-05-25 DIAGNOSIS — M79671 Pain in right foot: Secondary | ICD-10-CM | POA: Diagnosis not present

## 2021-06-01 ENCOUNTER — Other Ambulatory Visit: Payer: Self-pay

## 2021-06-01 ENCOUNTER — Ambulatory Visit: Payer: Medicare Other

## 2021-06-01 ENCOUNTER — Other Ambulatory Visit: Payer: Self-pay | Admitting: Internal Medicine

## 2021-06-01 ENCOUNTER — Ambulatory Visit (INDEPENDENT_AMBULATORY_CARE_PROVIDER_SITE_OTHER): Payer: Medicare Other

## 2021-06-01 DIAGNOSIS — E538 Deficiency of other specified B group vitamins: Secondary | ICD-10-CM

## 2021-06-01 MED ORDER — CYANOCOBALAMIN 1000 MCG/ML IJ SOLN
1000.0000 ug | Freq: Once | INTRAMUSCULAR | Status: AC
  Administered 2021-06-01: 1000 ug via INTRAMUSCULAR

## 2021-06-01 NOTE — Progress Notes (Addendum)
Patient here for monthly B12 injection per Dr. Burns. B12 1000 mcg given in left IM and patient tolerated injection well today.  

## 2021-06-03 DIAGNOSIS — H6122 Impacted cerumen, left ear: Secondary | ICD-10-CM | POA: Diagnosis not present

## 2021-06-03 DIAGNOSIS — H938X3 Other specified disorders of ear, bilateral: Secondary | ICD-10-CM | POA: Diagnosis not present

## 2021-06-03 DIAGNOSIS — L821 Other seborrheic keratosis: Secondary | ICD-10-CM | POA: Diagnosis not present

## 2021-06-03 DIAGNOSIS — L299 Pruritus, unspecified: Secondary | ICD-10-CM | POA: Insufficient documentation

## 2021-06-03 DIAGNOSIS — Z85828 Personal history of other malignant neoplasm of skin: Secondary | ICD-10-CM | POA: Diagnosis not present

## 2021-06-03 DIAGNOSIS — L570 Actinic keratosis: Secondary | ICD-10-CM | POA: Diagnosis not present

## 2021-06-09 ENCOUNTER — Telehealth: Payer: Self-pay

## 2021-06-09 ENCOUNTER — Other Ambulatory Visit: Payer: Self-pay

## 2021-06-09 ENCOUNTER — Ambulatory Visit (INDEPENDENT_AMBULATORY_CARE_PROVIDER_SITE_OTHER): Payer: Medicare Other

## 2021-06-09 DIAGNOSIS — Z0000 Encounter for general adult medical examination without abnormal findings: Secondary | ICD-10-CM

## 2021-06-09 NOTE — Progress Notes (Signed)
°I connected with Tami Jacobson today by telephone and verified that I am speaking with the correct person using two identifiers. °Location patient: home °Location provider: work °Persons participating in the virtual visit: patient, provider. °  °I discussed the limitations, risks, security and privacy concerns of performing an evaluation and management service by telephone and the availability of in person appointments. I also discussed with the patient that there may be a patient responsible charge related to this service. The patient expressed understanding and verbally consented to this telephonic visit.  °  °Interactive audio and video telecommunications were attempted between this provider and patient, however failed, due to patient having technical difficulties OR patient did not have access to video capability.  We continued and completed visit with audio only. ° °Some vital signs may be absent or patient reported.  ° °Time Spent with patient on telephone encounter: 40 minutes ° °Subjective:  ° Tami Jacobson is a 88 y.o. female who presents for Medicare Annual (Subsequent) preventive examination. ° °Review of Systems    ° °Cardiac Risk Factors include: advanced age (>55men, >65 women);family history of premature cardiovascular disease;hypertension;dyslipidemia;diabetes mellitus ° °   °Objective:  °  °There were no vitals filed for this visit. °There is no height or weight on file to calculate BMI. ° °Advanced Directives 06/09/2021 05/06/2020 05/15/2019 09/28/2018 09/22/2017 02/28/2017 12/02/2016  °Does Patient Have a Medical Advance Directive? Yes Yes No Yes Yes Yes No  °Type of Advance Directive Healthcare Power of Attorney;Living will Healthcare Power of Attorney;Living will - Healthcare Power of Attorney;Living will Healthcare Power of Attorney;Living will Healthcare Power of Attorney;Living will -  °Does patient want to make changes to medical advance directive? No - Patient declined No - Patient  declined - Yes (ED - Information included in AVS) - - -  °Copy of Healthcare Power of Attorney in Chart? Yes - validated most recent copy scanned in chart (See row information) No - copy requested - No - copy requested No - copy requested - -  °Would patient like information on creating a medical advance directive? - - No - Patient declined - - - -  °Pre-existing out of facility DNR order (yellow form or pink MOST form) - - - - - - -  ° ° °Current Medications (verified) °Outpatient Encounter Medications as of 06/09/2021  °Medication Sig  ° Accu-Chek Softclix Lancets lancets Use as instructed  ° acetaminophen (TYLENOL) 500 MG tablet Take 500 mg by mouth every 6 (six) hours as needed.  ° blood glucose meter kit and supplies KIT Dispense based on patient and insurance preference. Check sugars once daily and as needed as directed.  ° clobetasol cream (TEMOVATE) 0.05 % Apply 1 application topically as needed (for skin irritation- to affected areas).   ° cyanocobalamin (,VITAMIN B-12,) 1000 MCG/ML injection Inject 1,000 mcg into the muscle every 30 (thirty) days.  ° flecainide (TAMBOCOR) 50 MG tablet TAKE 1 TABLET BY MOUTH  TWICE DAILY  ° glucose blood (ACCU-CHEK GUIDE) test strip USE AS DIRECTED  ° losartan (COZAAR) 100 MG tablet TAKE 1 TABLET BY MOUTH  DAILY  ° metFORMIN (GLUCOPHAGE-XR) 500 MG 24 hr tablet TAKE 1 TABLET BY MOUTH ONCE DAILY WITH BREAKFAST  ° metoprolol succinate (TOPROL-XL) 50 MG 24 hr tablet Take 1.5 tablets (75 mg total) by mouth daily. Take with or immediately following a meal.  ° omeprazole (PRILOSEC) 40 MG capsule TAKE 1 CAPSULE BY MOUTH  DAILY  ° tamoxifen (NOLVADEX) 20 MG tablet TAKE 1   TABLET BY MOUTH  DAILY  ° terconazole (TERAZOL 7) 0.4 % vaginal cream Place 1 applicator vaginally at bedtime.  ° °No facility-administered encounter medications on file as of 06/09/2021.  ° ° °Allergies (verified) °Statins, Effexor [venlafaxine], Cephalexin, Codeine, Lansoprazole, Latex, Levofloxacin, Nitrofurantoin,  and Rofecoxib  ° °History: °Past Medical History:  °Diagnosis Date  ° Arthritis   ° left hip  ° Breast cancer, right (HCC) 11/2016  ° Family history of breast cancer   ° Family history of colon cancer   ° Family history of pancreatic cancer   ° GERD (gastroesophageal reflux disease)   ° Non-insulin dependent type 2 diabetes mellitus (HCC)   ° PAC (premature atrial contraction)   ° Personal history of radiation therapy 12/2016  ° Premature ventricular contraction   ° Seasonal allergies   ° Sensitive skin   ° Vitamin B12 deficiency   ° °Past Surgical History:  °Procedure Laterality Date  ° ABDOMINAL HYSTERECTOMY  1979  ° partial  ° BREAST BIOPSY  07/02/2011  ° Procedure: BREAST BIOPSY WITH NEEDLE LOCALIZATION;  Surgeon: David H Newman, MD;  Location: [facility] OR;  Service: General;  Laterality: Left;  left breast atypical hyperplasia needle localization biopsy  ° BREAST BIOPSY Left 06/13/2006  ° BREAST BIOPSY Right 06/27/2008  ° BREAST BIOPSY Right 09/27/2016  ° BREAST EXCISIONAL BIOPSY Left 07/02/2011  ° BREAST LUMPECTOMY Left 07/04/2006  ° BREAST LUMPECTOMY Right 06/26/2008  ° BREAST LUMPECTOMY Right 09/27/2016  ° BREAST LUMPECTOMY WITH RADIOACTIVE SEED LOCALIZATION Right 11/18/2016  ° Procedure: RIGHT BREAST LUMPECTOMY WITH RADIOACTIVE SEED LOCALIZATION ERAS PATHWAY;  Surgeon: Newman, David, MD;  [facility]:  [facility];  Service: General;  Laterality: Right;  ERAS PATHWAY  ° CATARACT EXTRACTION Bilateral   ° CHOLECYSTECTOMY, LAPAROSCOPIC  07/07/2001  ° had appendex taking out at the same time  ° ESOPHAGEAL DILATION  2006  ° LAPAROSCOPIC APPENDECTOMY  07/07/2001  ° TONSILLECTOMY  1942  ° °Family History  °Problem Relation Age of Onset  ° Colon cancer Mother 60  ° Heart attack Mother   ° Breast cancer Sister 50  ° Heart disease Maternal Uncle   °     CABG  ° Breast cancer Sister 76  ° Heart disease Brother   ° Other Father   °     suicide  ° Breast cancer Sister 68  ° Heart disease Sister   ° COPD Brother   °  Heart disease Brother   ° Breast cancer Other 45  °     niece - brother's daughter  ° Pancreatic cancer Other   °     brother's son, dx in his 50s  ° °Social History  ° °Socioeconomic History  ° Marital status: Divorced  °  Spouse name: Thomas  ° Number of children: 1  ° Years of education: Not on file  ° Highest education level: Not on file  °Occupational History  ° Occupation: retire  °Tobacco Use  ° Smoking status: Never  ° Smokeless tobacco: Never  °Vaping Use  ° Vaping Use: Never used  °Substance and Sexual Activity  ° Alcohol use: No  °  Alcohol/week: 0.0 standard drinks  ° Drug use: No  ° Sexual activity: Not Currently  °Other Topics Concern  ° Not on file  °Social History Narrative  ° Not on file  ° °Social Determinants of Health  ° °Financial Resource Strain: Low Risk   ° Difficulty of Paying Living Expenses: Not hard at all  °Food Insecurity: No Food   Insecurity  ° Worried About Running Out of Food in the Last Year: Never true  ° Ran Out of Food in the Last Year: Never true  °Transportation Needs: No Transportation Needs  ° Lack of Transportation (Medical): No  ° Lack of Transportation (Non-Medical): No  °Physical Activity: Inactive  ° Days of Exercise per Week: 0 days  ° Minutes of Exercise per Session: 0 min  °Stress: No Stress Concern Present  ° Feeling of Stress : Not at all  °Social Connections: Moderately Integrated  ° Frequency of Communication with Friends and Family: More than three times a week  ° Frequency of Social Gatherings with Friends and Family: More than three times a week  ° Attends Religious Services: More than 4 times per year  ° Active Member of Clubs or Organizations: No  ° Attends Club or Organization Meetings: More than 4 times per year  ° Marital Status: Widowed  ° ° °Tobacco Counseling °Counseling given: Not Answered ° ° °Clinical Intake: ° °Pre-visit preparation completed: Yes ° °Pain : No/denies pain ° °  ° °Nutritional Risks: None °Diabetes: No ° °How often do you need to have  someone help you when you read instructions, pamphlets, or other written materials from your doctor or pharmacy?: 1 - Never °What is the last grade level you completed in school?: HSG ° °Diabetic? yes ° °Interpreter Needed?: No ° °Information entered by :: Shenika Hatfield, LPN ° ° °Activities of Daily Living °In your present state of health, do you have any difficulty performing the following activities: 06/09/2021  °Hearing? N  °Vision? N  °Difficulty concentrating or making decisions? N  °Walking or climbing stairs? Y  °Comment uses a cane  °Dressing or bathing? N  °Doing errands, shopping? N  °Preparing Food and eating ? N  °Using the Toilet? N  °In the past six months, have you accidently leaked urine? N  °Do you have problems with loss of bowel control? N  °Managing your Medications? N  °Managing your Finances? N  °Housekeeping or managing your Housekeeping? N  °Some recent data might be hidden  ° ° °Patient Care Team: °Burns, Stacy J, MD as PCP - General (Internal Medicine) °Crenshaw, Brian S, MD as Consulting Physician (Cardiology) °Magrinat, Gustav C, MD (Inactive) as Consulting Physician (Hematology and Oncology) °Henley, Thomas, MD as Consulting Physician (Obstetrics and Gynecology) °Newman, David, MD as Consulting Physician (General Surgery) °Newman, Christopher E, MD (Inactive) as Consulting Physician (Otolaryngology) °Moody, John, MD as Consulting Physician (Radiation Oncology) °Causey, Lindsey Cornetto, NP as Nurse Practitioner (Hematology and Oncology) °McCuen, Christine, MD as Consulting Physician (Ophthalmology) °Foltanski, Lindsey N, RPH as Pharmacist (Pharmacist) ° °Indicate any recent Medical Services you may have received from other than [facility] providers in the past year (date may be approximate). ° °   °Assessment:  ° This is a routine wellness examination for Jannel. ° °Hearing/Vision screen °No results found. ° °Dietary issues and exercise activities discussed: °Current Exercise Habits: The  patient does not participate in regular exercise at present, Exercise limited by: neurologic condition(s) (tremors; uses a cane) ° ° Goals Addressed   ° °  °  °  °  ° This Visit's Progress  °  Patient Stated     °  Continue to be independent and socially active. °  ° °  °Depression Screen °PHQ 2/9 Scores 06/09/2021 05/06/2020 02/07/2020 10/03/2018 09/28/2018 09/22/2017 02/28/2017  °PHQ - 2 Score 0 0 1 4 4 1 1  °PHQ- 9 Score - - 3   10 10 3 -  °  °Fall Risk °Fall Risk  06/09/2021 05/06/2020 01/30/2020 11/27/2019 09/28/2018  °Falls in the past year? 1 1 0 0 0  °Number falls in past yr: 0 0 - - 0  °Injury with Fall? 1 0 - - -  °Risk for fall due to : Impaired balance/gait No Fall Risks - - Impaired balance/gait  °Follow up - - - - Falls prevention discussed  ° ° °FALL RISK PREVENTION PERTAINING TO THE HOME: ° °Any stairs in or around the home? No  °If so, are there any without handrails? No  °Home free of loose throw rugs in walkways, pet beds, electrical cords, etc? Yes  °Adequate lighting in your home to reduce risk of falls? Yes  ° °ASSISTIVE DEVICES UTILIZED TO PREVENT FALLS: ° °Life alert? No  °Use of a cane, walker or w/c? Yes  °Grab bars in the bathroom? Yes  °Shower chair or bench in shower? Yes  °Elevated toilet seat or a handicapped toilet? Yes  ° °TIMED UP AND GO: ° °Was the test performed? No .  °Length of time to ambulate 10 feet: 0 sec.  ° °Gait slow and steady with assistive device ° °Cognitive Function: Normal cognitive status assessed by direct observation by this Nurse Health Advisor. No abnormalities found.  ° °MMSE - Mini Mental State Exam 01/30/2020 09/22/2017 09/27/2016  °Not completed: - - Refused  °Orientation to time 5 5 -  °Orientation to Place 5 5 -  °Registration 3 3 -  °Attention/ Calculation 5 5 -  °Recall 3 3 -  °Language- name 2 objects 2 2 -  °Language- repeat 1 1 -  °Language- follow 3 step command 2 3 -  °Language- read & follow direction 0 1 -  °Write a sentence 1 1 -  °Copy design 0 1 -  °Total score  27 30 -  ° °  °  ° °Immunizations °Immunization History  °Administered Date(s) Administered  ° Fluad Quad(high Dose 65+) 01/04/2019, 01/07/2020, 12/09/2020  ° Influenza Split 12/14/2011  ° Influenza Whole 02/01/2007, 01/03/2009, 12/15/2010  ° Influenza, High Dose Seasonal PF 01/01/2014, 12/25/2014, 12/15/2015, 12/24/2016, 12/27/2017  ° Influenza,inj,Quad PF,6+ Mos 12/26/2012  ° Moderna Sars-Covid-2 Vaccination 06/03/2019, 06/30/2019, 03/07/2020  ° Pneumococcal Conjugate-13 07/02/2014  ° Pneumococcal Polysaccharide-23 02/16/2002, 02/09/2012  ° Td 06/10/2009  ° ° °TDAP status: Due, Education has been provided regarding the importance of this vaccine. Advised may receive this vaccine at local pharmacy or Health Dept. Aware to provide a copy of the vaccination record if obtained from local pharmacy or Health Dept. Verbalized acceptance and understanding. ° °Flu Vaccine status: Up to date ° °Pneumococcal vaccine status: Up to date ° °Covid-19 vaccine status: Completed vaccines ° °Qualifies for Shingles Vaccine? Yes   °Zostavax completed No   °Shingrix Completed?: No.    Education has been provided regarding the importance of this vaccine. Patient has been advised to call insurance company to determine out of pocket expense if they have not yet received this vaccine. Advised may also receive vaccine at local pharmacy or Health Dept. Verbalized acceptance and understanding. ° °Screening Tests °Health Maintenance  °Topic Date Due  ° Zoster Vaccines- Shingrix (1 of 2) Never done  ° TETANUS/TDAP  06/11/2019  ° COVID-19 Vaccine (4 - Booster for Moderna series) 05/02/2020  ° OPHTHALMOLOGY EXAM  11/26/2021  ° FOOT EXAM  12/17/2021  ° Pneumonia Vaccine 65+ Years old  Completed  ° INFLUENZA VACCINE  Completed  ° DEXA SCAN    Completed   HPV VACCINES  Aged Out    Health Maintenance  Health Maintenance Due  Topic Date Due   Zoster Vaccines- Shingrix (1 of 2) Never done   TETANUS/TDAP  06/11/2019   COVID-19 Vaccine (4 -  Booster for Moderna series) 05/02/2020    Colorectal cancer screening: No longer required.   Mammogram status: Completed 09/10/2020. Repeat every year  Lung Cancer Screening: (Low Dose CT Chest recommended if Age 2-80 years, 30 pack-year currently smoking OR have quit w/in 15years.) does not qualify.   Lung Cancer Screening Referral: no  Additional Screening:  Hepatitis C Screening: does not qualify; Completed no  Vision Screening: Recommended annual ophthalmology exams for early detection of glaucoma and other disorders of the eye. Is the patient up to date with their annual eye exam?  Yes  Who is the provider or what is the name of the office in which the patient attends annual eye exams? Luberta Mutter, MD. If pt is not established with a provider, would they like to be referred to a provider to establish care? No .   Dental Screening: Recommended annual dental exams for proper oral hygiene  Community Resource Referral / Chronic Care Management: CRR required this visit?  No   CCM required this visit?  No      Plan:     I have personally reviewed and noted the following in the patients chart:   Medical and social history Use of alcohol, tobacco or illicit drugs  Current medications and supplements including opioid prescriptions.  Functional ability and status Nutritional status Physical activity Advanced directives List of other physicians Hospitalizations, surgeries, and ER visits in previous 12 months Vitals Screenings to include cognitive, depression, and falls Referrals and appointments  In addition, I have reviewed and discussed with patient certain preventive protocols, quality metrics, and best practice recommendations. A written personalized care plan for preventive services as well as general preventive health recommendations were provided to patient.     Sheral Flow, LPN   12/11/3530   Nurse Notes:  Patient is cogitatively intact. There were  no vitals filed for this visit. There is no height or weight on file to calculate BMI.

## 2021-06-09 NOTE — Telephone Encounter (Signed)
Patient is requesting to speak with [facility]. ?

## 2021-06-09 NOTE — Patient Instructions (Signed)
[name] , Thank you for taking time to come for your Medicare Wellness Visit. I appreciate your ongoing commitment to your health goals. Please review the following plan we discussed and let me know if I can assist you in the future.   Screening recommendations/referrals: Colonoscopy: discontinued Mammogram: 09/10/2020; due every year Bone Density: never done Recommended yearly ophthalmology/optometry visit for glaucoma screening and checkup Recommended yearly dental visit for hygiene and checkup  Vaccinations: Influenza vaccine: 12/09/2020 Pneumococcal vaccine: 02/09/2012, 07/02/2014 Tdap vaccine: 06/10/2009; due every 10 years (OVERDUE) Shingles vaccine: never done   Covid-19: 06/03/2019, 06/30/2019, 03/07/2020  Advanced directives: Yes; documents on file.  Conditions/risks identified: Yes; Client understands the importance of follow-up appointments with providers by attending scheduled visits and discussed goals to eat healthier, increase physical activity 5 times a week for 30 minutes each, exercise the brain by doing stimulating brain exercises (reading, adult coloring, crafting, listening to music, puzzles, etc.), socialize and enjoy life more, get enough sleep at least 8-9 hours average per night and make time for laughter.  Next appointment: Please schedule your next Medicare Wellness Visit with your Nurse Health Advisor in 1 year by calling [phone].   Preventive Care 86 Years and Older, Female Preventive care refers to lifestyle choices and visits with your health care provider that can promote health and wellness. What does preventive care include? A yearly physical exam. This is also called an annual well check. Dental exams once or twice a year. Routine eye exams. Ask your health care provider how often you should have your eyes checked. Personal lifestyle choices, including: Daily care of your teeth and gums. Regular physical activity. Eating a healthy diet. Avoiding  tobacco and drug use. Limiting alcohol use. Practicing safe sex. Taking low-dose aspirin every day. Taking vitamin and mineral supplements as recommended by your health care provider. What happens during an annual well check? The services and screenings done by your health care provider during your annual well check will depend on your age, overall health, lifestyle risk factors, and family history of disease. Counseling  Your health care provider may ask you questions about your: Alcohol use. Tobacco use. Drug use. Emotional well-being. Home and relationship well-being. Sexual activity. Eating habits. History of falls. Memory and ability to understand (cognition). Work and work Statistician. Reproductive health. Screening  You may have the following tests or measurements: Height, weight, and BMI. Blood pressure. Lipid and cholesterol levels. These may be checked every 5 years, or more frequently if you are over 37 years old. Skin check. Lung cancer screening. You may have this screening every year starting at age 86 if you have a 30-pack-year history of smoking and currently smoke or have quit within the past 15 years. Fecal occult blood test (FOBT) of the stool. You may have this test every year starting at age 12. Flexible sigmoidoscopy or colonoscopy. You may have a sigmoidoscopy every 5 years or a colonoscopy every 10 years starting at age 75. Hepatitis C blood test. Hepatitis B blood test. Sexually transmitted disease (STD) testing. Diabetes screening. This is done by checking your blood sugar (glucose) after you have not eaten for a while (fasting). You may have this done every 1-3 years. Bone density scan. This is done to screen for osteoporosis. You may have this done starting at age 70. Mammogram. This may be done every 1-2 years. Talk to your health care provider about how often you should have regular mammograms. Talk with your health care provider about your test  results, treatment options, and if necessary, the need for more tests. Vaccines  Your health care provider may recommend certain vaccines, such as: Influenza vaccine. This is recommended every year. Tetanus, diphtheria, and acellular pertussis (Tdap, Td) vaccine. You may need a Td booster every 10 years. Zoster vaccine. You may need this after age 62. Pneumococcal 13-valent conjugate (PCV13) vaccine. One dose is recommended after age 33. Pneumococcal polysaccharide (PPSV23) vaccine. One dose is recommended after age 7. Talk to your health care provider about which screenings and vaccines you need and how often you need them. This information is not intended to replace advice given to you by your health care provider. Make sure you discuss any questions you have with your health care provider. Document Released: 04/18/2015 Document Revised: 12/10/2015 Document Reviewed: 01/21/2015 Elsevier Interactive Patient Education  2017 [place] Prevention in the Home Falls can cause injuries. They can happen to people of all ages. There are many things you can do to make your home safe and to help prevent falls. What can I do on the outside of my home? Regularly fix the edges of walkways and driveways and fix any cracks. Remove anything that might make you trip as you walk through a door, such as a raised step or threshold. Trim any bushes or trees on the path to your home. Use bright outdoor lighting. Clear any walking paths of anything that might make someone trip, such as rocks or tools. Regularly check to see if handrails are loose or broken. Make sure that both sides of any steps have handrails. Any raised decks and porches should have guardrails on the edges. Have any leaves, snow, or ice cleared regularly. Use sand or salt on walking paths during winter. Clean up any spills in your garage right away. This includes oil or grease spills. What can I do in the bathroom? Use night  lights. Install grab bars by the toilet and in the tub and shower. Do not use towel bars as grab bars. Use non-skid mats or decals in the tub or shower. If you need to sit down in the shower, use a plastic, non-slip stool. Keep the floor dry. Clean up any water that spills on the floor as soon as it happens. Remove soap buildup in the tub or shower regularly. Attach bath mats securely with double-sided non-slip rug tape. Do not have throw rugs and other things on the floor that can make you trip. What can I do in the bedroom? Use night lights. Make sure that you have a light by your bed that is easy to reach. Do not use any sheets or blankets that are too big for your bed. They should not hang down onto the floor. Have a firm chair that has side arms. You can use this for support while you get dressed. Do not have throw rugs and other things on the floor that can make you trip. What can I do in the kitchen? Clean up any spills right away. Avoid walking on wet floors. Keep items that you use a lot in easy-to-reach places. If you need to reach something above you, use a strong step stool that has a grab bar. Keep electrical cords out of the way. Do not use floor polish or wax that makes floors slippery. If you must use wax, use non-skid floor wax. Do not have throw rugs and other things on the floor that can make you trip. What can I do with my stairs? Do  not leave any items on the stairs. Make sure that there are handrails on both sides of the stairs and use them. Fix handrails that are broken or loose. Make sure that handrails are as long as the stairways. Check any carpeting to make sure that it is firmly attached to the stairs. Fix any carpet that is loose or worn. Avoid having throw rugs at the top or bottom of the stairs. If you do have throw rugs, attach them to the floor with carpet tape. Make sure that you have a light switch at the top of the stairs and the bottom of the stairs. If  you do not have them, ask someone to add them for you. What else can I do to help prevent falls? Wear shoes that: Do not have high heels. Have rubber bottoms. Are comfortable and fit you well. Are closed at the toe. Do not wear sandals. If you use a stepladder: Make sure that it is fully opened. Do not climb a closed stepladder. Make sure that both sides of the stepladder are locked into place. Ask someone to hold it for you, if possible. Clearly mark and make sure that you can see: Any grab bars or handrails. First and last steps. Where the edge of each step is. Use tools that help you move around (mobility aids) if they are needed. These include: Canes. Walkers. Scooters. Crutches. Turn on the lights when you go into a dark area. Replace any light bulbs as soon as they burn out. Set up your furniture so you have a clear path. Avoid moving your furniture around. If any of your floors are uneven, fix them. If there are any pets around you, be aware of where they are. Review your medicines with your doctor. Some medicines can make you feel dizzy. This can increase your chance of falling. Ask your doctor what other things that you can do to help prevent falls. This information is not intended to replace advice given to you by your health care provider. Make sure you discuss any questions you have with your health care provider. Document Released: 01/16/2009 Document Revised: 08/28/2015 Document Reviewed: 04/26/2014 Elsevier Interactive Patient Education  2017 [employer].

## 2021-06-10 NOTE — Telephone Encounter (Signed)
Spoke with patient today. 

## 2021-06-11 DIAGNOSIS — M79671 Pain in right foot: Secondary | ICD-10-CM | POA: Diagnosis not present

## 2021-06-17 ENCOUNTER — Other Ambulatory Visit: Payer: Self-pay

## 2021-06-17 ENCOUNTER — Ambulatory Visit: Payer: Medicare Other | Admitting: Podiatry

## 2021-06-17 DIAGNOSIS — M79675 Pain in left toe(s): Secondary | ICD-10-CM

## 2021-06-17 DIAGNOSIS — B351 Tinea unguium: Secondary | ICD-10-CM | POA: Diagnosis not present

## 2021-06-17 DIAGNOSIS — M79674 Pain in right toe(s): Secondary | ICD-10-CM | POA: Diagnosis not present

## 2021-06-17 NOTE — Progress Notes (Signed)
? ?  SUBJECTIVE ?Patient presents to office today complaining of elongated, thickened nails that cause pain while ambulating in shoes.  Patient is unable to trim their own nails. Patient is here for further evaluation and treatment. ? ?Past Medical History:  ?Diagnosis Date  ? Arthritis   ? left hip  ? Breast cancer, right ([place]) 11/2016  ? Family history of breast cancer   ? Family history of colon cancer   ? Family history of pancreatic cancer   ? GERD (gastroesophageal reflux disease)   ? Non-insulin dependent type 2 diabetes mellitus ([place])   ? PAC (premature atrial contraction)   ? Personal history of radiation therapy 12/2016  ? Premature ventricular contraction   ? Seasonal allergies   ? Sensitive skin   ? Vitamin B12 deficiency   ? ? ?OBJECTIVE ?General Patient is awake, alert, and oriented x 3 and in no acute distress. ?Derm Skin is dry and supple bilateral. Negative open lesions or macerations. Remaining integument unremarkable. Nails are tender, long, thickened and dystrophic with subungual debris, consistent with onychomycosis, bilateral great toenails. No signs of infection noted. ?Vasc  DP and PT pedal pulses palpable bilaterally. Temperature gradient within normal limits.  ?Neuro Epicritic and protective threshold sensation grossly intact bilaterally.  ?Musculoskeletal Exam No symptomatic pedal deformities noted bilateral. Muscular strength within normal limits. ? ?ASSESSMENT ?1.  Pain due to onychomycosis of toenails bilateral ? ?PLAN OF CARE ?1. Patient evaluated today.  ?2. Instructed to maintain good pedal hygiene and foot care.  ?3. Mechanical debridement of bilateral toenails performed using a nail nipper. Filed with dremel without incident.  ?4. Return to clinic in 3 mos.  ? ? ?Edrick Kins, DPM ?[place] ? ?Dr. Edrick Kins, DPM  ?  ?[address]                                     ?[place], [place] [address]                ?Office [phone]  ?Fax [phone] ? ? ? ? ?

## 2021-06-23 ENCOUNTER — Other Ambulatory Visit: Payer: Self-pay | Admitting: Cardiology

## 2021-06-23 DIAGNOSIS — R002 Palpitations: Secondary | ICD-10-CM

## 2021-06-29 ENCOUNTER — Other Ambulatory Visit: Payer: Self-pay

## 2021-06-29 ENCOUNTER — Ambulatory Visit (INDEPENDENT_AMBULATORY_CARE_PROVIDER_SITE_OTHER): Payer: Medicare Other

## 2021-06-29 DIAGNOSIS — E538 Deficiency of other specified B group vitamins: Secondary | ICD-10-CM | POA: Diagnosis not present

## 2021-06-29 MED ORDER — CYANOCOBALAMIN 1000 MCG/ML IJ SOLN
1000.0000 ug | Freq: Once | INTRAMUSCULAR | Status: AC
  Administered 2021-06-29: 1000 ug via INTRAMUSCULAR

## 2021-06-29 NOTE — Progress Notes (Signed)
Pt here for monthly B12 injection per Dr. Quay Burow ? ?B12 1091mg given IM, and pt tolerated injection well. ? ?Next B12 injection scheduled for 07/30/21 ?

## 2021-07-06 DIAGNOSIS — M545 Low back pain, unspecified: Secondary | ICD-10-CM | POA: Diagnosis not present

## 2021-07-08 ENCOUNTER — Telehealth: Payer: Medicare Other

## 2021-07-08 NOTE — Progress Notes (Deleted)
? ?Chronic Care Management ?Pharmacy Note ? ?07/08/2021 ?Name:  Tami Jacobson MRN:  [id number] DOB:  1927/08/17 ? ?Summary: ?-Patient reports that since stopping hydralazine - BP remains under control (averaging 115-140/70s) - notes that dry mouth has not seemed to improve - gets mild relief from dry mouth through mints and biotene mouth rinse  ?-Patient notes that she does not drink as many fluids as she should throughout the day, eats salty foods throughout the day as well  ?-Blood sugars have been averaging in the 170's - notes she is aware of how her diet impacts her blood sugars  ? ?Recommendations/Changes made from today's visit: ?-Recommended to patient to hydrate more throughout the day, also encouraged patient to reduce sodium intake to help decrease dry mouth / better control BP ?-Advised for patient to continue monitoring BG - watch carb intake, goal BG <150 to keep A1c at goal ? ?Plan: ?F/u in 4 months  ? ?Subjective: ?Tami Jacobson is an 88 y.o. year old female who is a primary patient of Burns, Claudina Lick, MD.  The CCM team was consulted for assistance with disease management and care coordination needs.   ? ?Engaged with patient by telephone for follow up visit in response to provider referral for pharmacy case management and/or care coordination services.  ? ?Consent to Services:  ?The patient was given the following information about Chronic Care Management services today, agreed to services, and gave verbal consent: 1. CCM service includes personalized support from designated clinical staff supervised by the primary care provider, including individualized plan of care and coordination with other care providers 2. 24/7 contact phone numbers for assistance for urgent and routine care needs. 3. Service will only be billed when office clinical staff spend 20 minutes or more in a month to coordinate care. 4. Only one practitioner may furnish and bill the service in a calendar month. 5.The  patient may stop CCM services at any time (effective at the end of the month) by phone call to the office staff. 6. The patient will be responsible for cost sharing (co-pay) of up to 20% of the service fee (after annual deductible is met). Patient agreed to services and consent obtained. ? ?Patient Care Team: ?Binnie Rail, MD as PCP - General (Internal Medicine) ?Lelon Perla, MD as Consulting Physician (Cardiology) ?Magrinat, Virgie Dad, MD (Inactive) as Consulting Physician (Hematology and Oncology) ?Newton Pigg, MD as Consulting Physician (Obstetrics and Gynecology) ?Alphonsa Overall, MD as Consulting Physician (General Surgery) ?Rozetta Nunnery, MD (Inactive) as Consulting Physician (Otolaryngology) ?Kyung Rudd, MD as Consulting Physician (Radiation Oncology) ?Gardenia Phlegm, NP as Nurse Practitioner (Hematology and Oncology) ?Luberta Mutter, MD as Consulting Physician (Ophthalmology) ?Foltanski, Cleaster Corin, [facility] as Pharmacist (Pharmacist) ? ?Recent office visits: ?03/11/2021 - Dr. Quay Burow - no changes to medications - f/u in 6 months  ? ?Recent consult visits: ?06/17/2021 - Dr. Amalia Hailey - Podiatry - no changes to medications  ?06/03/2021 - Dr. Lucia Gaskins - ENT - wax removal - f/u in 6 months ? ?Hospital visits: ?None in previous 6 months ? ?Objective: ? ?Lab Results  ?Component Value Date  ? CREATININE 0.79 03/11/2021  ? BUN 13 03/11/2021  ? GFR 65.40 03/11/2021  ? GFRNONAA 43 (L) 11/04/2020  ? GFRAA 46 (L) 10/22/2019  ? NA 135 03/11/2021  ? K 4.7 03/11/2021  ? CALCIUM 9.9 03/11/2021  ? CO2 30 03/11/2021  ? GLUCOSE 226 (H) 03/11/2021  ? ? ?Lab Results  ?Component Value Date/Time  ?  HGBA1C 7.0 (H) 03/11/2021 11:59 AM  ? HGBA1C 6.9 (H) 09/04/2020 11:59 AM  ? GFR 65.40 03/11/2021 11:59 AM  ? GFR 49.47 (L) 09/04/2020 11:59 AM  ? MICROALBUR 1.0 08/23/2018 11:51 AM  ? MICROALBUR 0.8 09/21/2016 09:46 AM  ?  ?Last diabetic Eye exam:  ?Lab Results  ?Component Value Date/Time  ? HMDIABEYEEXA No Retinopathy  11/26/2020 12:00 AM  ?  ?Last diabetic Foot exam:  ?No results found for: HMDIABFOOTEX  ? ?Lab Results  ?Component Value Date  ? CHOL 173 02/22/2019  ? HDL 50.40 02/22/2019  ? [place] 92 02/22/2019  ? LDLDIRECT 128.3 12/26/2012  ? TRIG 157.0 (H) 02/22/2019  ? CHOLHDL 3 02/22/2019  ? ? ? ?  Latest Ref Rng & Units 03/11/2021  ? 11:59 AM 11/04/2020  ? 10:36 AM 09/04/2020  ? 11:59 AM  ?Hepatic Function  ?Total Protein 6.0 - 8.3 g/dL 6.7   6.8   6.4    ?Albumin 3.5 - 5.2 g/dL 3.6   3.8   4.0    ?AST 0 - 37 U/L '13   17   16    ' ?ALT 0 - 35 U/L '11   14   12    ' ?Alk Phosphatase 39 - 117 U/L 43   45   47    ?Total Bilirubin 0.2 - 1.2 mg/dL 0.6   0.7   0.5    ? ? ?Lab Results  ?Component Value Date/Time  ? TSH 2.519 05/15/2019 05:31 AM  ? TSH 1.90 08/23/2018 11:51 AM  ? TSH 1.54 08/20/2015 09:49 AM  ? ? ? ?  Latest Ref Rng & Units 03/11/2021  ? 11:59 AM 11/04/2020  ? 10:36 AM 09/04/2020  ? 11:59 AM  ?CBC  ?WBC 4.0 - 10.5 K/uL 6.2   4.4   5.4    ?Hemoglobin 12.0 - 15.0 g/dL 10.2   11.4   10.7    ?Hematocrit 36.0 - 46.0 % 29.9   33.8   31.3    ?Platelets 150.0 - 400.0 K/uL 184.0   128   127.0    ? ? ?Lab Results  ?Component Value Date/Time  ? VD25OH 35 10/07/2009 01:56 PM  ? VD25OH 31 10/09/2008 01:41 PM  ? ? ?Clinical ASCVD: No  ?The ASCVD Risk score (Arnett DK, et al., 2019) failed to calculate for the following reasons: ?  The 2019 ASCVD risk score is only valid for ages 45 to 36   ? ? ?  06/09/2021  ?  1:49 PM 05/06/2020  ? 12:48 PM 02/07/2020  ? 11:00 AM  ?Depression screen PHQ 2/9  ?Decreased Interest 0 0 0  ?Down, Depressed, Hopeless 0 0 1  ?PHQ - 2 Score 0 0 1  ?Altered sleeping   0  ?Tired, decreased energy   1  ?Change in appetite   1  ?Feeling bad or failure about yourself    0  ?Trouble concentrating   0  ?Moving slowly or fidgety/restless   0  ?Suicidal thoughts   0  ?PHQ-9 Score   3  ?Difficult doing work/chores   Not difficult at all  ?  ?Social History  ? ?Tobacco Use  ?Smoking Status Never  ?Smokeless Tobacco Never  ? ?BP  Readings from Last 3 Encounters:  ?03/11/21 128/78  ?12/30/20 130/70  ?12/09/20 120/64  ? ?Pulse Readings from Last 3 Encounters:  ?03/11/21 78  ?12/30/20 65  ?12/09/20 65  ? ?Wt Readings from Last 3 Encounters:  ?03/11/21 128 lb (58.1 kg)  ?  12/30/20 127 lb (57.6 kg)  ?12/09/20 127 lb 6.4 oz (57.8 kg)  ? ?BMI Readings from Last 3 Encounters:  ?03/11/21 22.67 kg/m?  ?12/30/20 22.50 kg/m?  ?12/09/20 22.57 kg/m?  ? ? ?Assessment/Interventions: Review of patient past medical history, allergies, medications, health status, including review of consultants reports, laboratory and other test data, was performed as part of comprehensive evaluation and provision of chronic care management services.  ? ?SDOH:  (Social Determinants of Health) assessments and interventions performed: Yes ? ?SDOH Screenings  ? ?Alcohol Screen: Low Risk   ? Last Alcohol Screening Score (AUDIT): 0  ?Depression (PHQ2-9): Low Risk   ? PHQ-2 Score: 0  ?Financial Resource Strain: Low Risk   ? Difficulty of Paying Living Expenses: Not hard at all  ?Food Insecurity: No Food Insecurity  ? Worried About Charity fundraiser in the Last Year: Never true  ? Ran Out of Food in the Last Year: Never true  ?Housing: Low Risk   ? Last Housing Risk Score: 0  ?Physical Activity: Inactive  ? Days of Exercise per Week: 0 days  ? Minutes of Exercise per Session: 0 min  ?Social Connections: Moderately Integrated  ? Frequency of Communication with Friends and Family: More than three times a week  ? Frequency of Social Gatherings with Friends and Family: More than three times a week  ? Attends Religious Services: More than 4 times per year  ? Active Member of Clubs or Organizations: No  ? Attends Archivist Meetings: More than 4 times per year  ? Marital Status: Widowed  ?Stress: No Stress Concern Present  ? Feeling of Stress : Not at all  ?Tobacco Use: Low Risk   ? Smoking Tobacco Use: Never  ? Smokeless Tobacco Use: Never  ? Passive Exposure: Not on file   ?Transportation Needs: No Transportation Needs  ? Lack of Transportation (Medical): No  ? Lack of Transportation (Non-Medical): No  ? ? ?CCM Care Plan ? ?Allergies  ?Allergen Reactions  ? Statins Other (See Comments)

## 2021-07-09 DIAGNOSIS — M545 Low back pain, unspecified: Secondary | ICD-10-CM | POA: Diagnosis not present

## 2021-07-14 DIAGNOSIS — M545 Low back pain, unspecified: Secondary | ICD-10-CM | POA: Diagnosis not present

## 2021-07-15 DIAGNOSIS — M545 Low back pain, unspecified: Secondary | ICD-10-CM | POA: Diagnosis not present

## 2021-07-21 DIAGNOSIS — M5416 Radiculopathy, lumbar region: Secondary | ICD-10-CM | POA: Diagnosis not present

## 2021-07-22 DIAGNOSIS — S199XXA Unspecified injury of neck, initial encounter: Secondary | ICD-10-CM | POA: Diagnosis not present

## 2021-07-22 DIAGNOSIS — Z743 Need for continuous supervision: Secondary | ICD-10-CM | POA: Diagnosis not present

## 2021-07-22 DIAGNOSIS — W19XXXA Unspecified fall, initial encounter: Secondary | ICD-10-CM | POA: Diagnosis not present

## 2021-07-22 DIAGNOSIS — Z20822 Contact with and (suspected) exposure to covid-19: Secondary | ICD-10-CM | POA: Diagnosis not present

## 2021-07-22 DIAGNOSIS — I10 Essential (primary) hypertension: Secondary | ICD-10-CM | POA: Diagnosis not present

## 2021-07-22 DIAGNOSIS — I480 Paroxysmal atrial fibrillation: Secondary | ICD-10-CM | POA: Diagnosis not present

## 2021-07-22 DIAGNOSIS — Z7984 Long term (current) use of oral hypoglycemic drugs: Secondary | ICD-10-CM | POA: Diagnosis not present

## 2021-07-22 DIAGNOSIS — Z7409 Other reduced mobility: Secondary | ICD-10-CM | POA: Diagnosis not present

## 2021-07-22 DIAGNOSIS — I444 Left anterior fascicular block: Secondary | ICD-10-CM | POA: Diagnosis not present

## 2021-07-22 DIAGNOSIS — E871 Hypo-osmolality and hyponatremia: Secondary | ICD-10-CM | POA: Diagnosis not present

## 2021-07-22 DIAGNOSIS — I6789 Other cerebrovascular disease: Secondary | ICD-10-CM | POA: Diagnosis not present

## 2021-07-22 DIAGNOSIS — R404 Transient alteration of awareness: Secondary | ICD-10-CM | POA: Diagnosis not present

## 2021-07-22 DIAGNOSIS — S0990XA Unspecified injury of head, initial encounter: Secondary | ICD-10-CM | POA: Diagnosis not present

## 2021-07-22 DIAGNOSIS — R42 Dizziness and giddiness: Secondary | ICD-10-CM | POA: Diagnosis not present

## 2021-07-22 DIAGNOSIS — R2689 Other abnormalities of gait and mobility: Secondary | ICD-10-CM | POA: Diagnosis not present

## 2021-07-22 DIAGNOSIS — E119 Type 2 diabetes mellitus without complications: Secondary | ICD-10-CM | POA: Diagnosis not present

## 2021-07-22 DIAGNOSIS — I252 Old myocardial infarction: Secondary | ICD-10-CM | POA: Diagnosis not present

## 2021-07-22 DIAGNOSIS — Z79899 Other long term (current) drug therapy: Secondary | ICD-10-CM | POA: Diagnosis not present

## 2021-07-23 DIAGNOSIS — I10 Essential (primary) hypertension: Secondary | ICD-10-CM | POA: Diagnosis not present

## 2021-07-23 DIAGNOSIS — E119 Type 2 diabetes mellitus without complications: Secondary | ICD-10-CM | POA: Diagnosis not present

## 2021-07-23 DIAGNOSIS — N390 Urinary tract infection, site not specified: Secondary | ICD-10-CM | POA: Insufficient documentation

## 2021-07-23 DIAGNOSIS — E871 Hypo-osmolality and hyponatremia: Secondary | ICD-10-CM | POA: Diagnosis not present

## 2021-07-23 DIAGNOSIS — E860 Dehydration: Secondary | ICD-10-CM | POA: Diagnosis not present

## 2021-07-23 DIAGNOSIS — R7989 Other specified abnormal findings of blood chemistry: Secondary | ICD-10-CM | POA: Diagnosis not present

## 2021-07-23 DIAGNOSIS — R55 Syncope and collapse: Secondary | ICD-10-CM | POA: Diagnosis not present

## 2021-07-23 DIAGNOSIS — I444 Left anterior fascicular block: Secondary | ICD-10-CM | POA: Diagnosis not present

## 2021-07-23 DIAGNOSIS — I4891 Unspecified atrial fibrillation: Secondary | ICD-10-CM | POA: Insufficient documentation

## 2021-07-23 DIAGNOSIS — I480 Paroxysmal atrial fibrillation: Secondary | ICD-10-CM | POA: Diagnosis not present

## 2021-07-24 DIAGNOSIS — I4891 Unspecified atrial fibrillation: Secondary | ICD-10-CM | POA: Diagnosis not present

## 2021-07-24 DIAGNOSIS — R002 Palpitations: Secondary | ICD-10-CM | POA: Diagnosis not present

## 2021-07-24 DIAGNOSIS — I10 Essential (primary) hypertension: Secondary | ICD-10-CM | POA: Diagnosis not present

## 2021-07-24 DIAGNOSIS — N390 Urinary tract infection, site not specified: Secondary | ICD-10-CM | POA: Diagnosis not present

## 2021-07-24 DIAGNOSIS — E119 Type 2 diabetes mellitus without complications: Secondary | ICD-10-CM | POA: Diagnosis not present

## 2021-07-25 DIAGNOSIS — M5033 Other cervical disc degeneration, cervicothoracic region: Secondary | ICD-10-CM | POA: Diagnosis not present

## 2021-07-25 DIAGNOSIS — M50021 Cervical disc disorder at C4-C5 level with myelopathy: Secondary | ICD-10-CM | POA: Diagnosis not present

## 2021-07-25 DIAGNOSIS — I10 Essential (primary) hypertension: Secondary | ICD-10-CM | POA: Diagnosis not present

## 2021-07-25 DIAGNOSIS — Z7984 Long term (current) use of oral hypoglycemic drugs: Secondary | ICD-10-CM | POA: Diagnosis not present

## 2021-07-25 DIAGNOSIS — E871 Hypo-osmolality and hyponatremia: Secondary | ICD-10-CM | POA: Diagnosis not present

## 2021-07-25 DIAGNOSIS — E119 Type 2 diabetes mellitus without complications: Secondary | ICD-10-CM | POA: Diagnosis not present

## 2021-07-25 DIAGNOSIS — Z79899 Other long term (current) drug therapy: Secondary | ICD-10-CM | POA: Diagnosis not present

## 2021-07-25 DIAGNOSIS — I252 Old myocardial infarction: Secondary | ICD-10-CM | POA: Diagnosis not present

## 2021-07-25 DIAGNOSIS — R627 Adult failure to thrive: Secondary | ICD-10-CM | POA: Diagnosis not present

## 2021-07-25 DIAGNOSIS — M47812 Spondylosis without myelopathy or radiculopathy, cervical region: Secondary | ICD-10-CM | POA: Diagnosis not present

## 2021-07-25 DIAGNOSIS — R2689 Other abnormalities of gait and mobility: Secondary | ICD-10-CM | POA: Diagnosis not present

## 2021-07-25 DIAGNOSIS — I444 Left anterior fascicular block: Secondary | ICD-10-CM | POA: Diagnosis not present

## 2021-07-25 DIAGNOSIS — S0990XA Unspecified injury of head, initial encounter: Secondary | ICD-10-CM | POA: Diagnosis not present

## 2021-07-25 DIAGNOSIS — Z743 Need for continuous supervision: Secondary | ICD-10-CM | POA: Diagnosis not present

## 2021-07-25 DIAGNOSIS — Z9181 History of falling: Secondary | ICD-10-CM | POA: Diagnosis not present

## 2021-07-25 DIAGNOSIS — I4891 Unspecified atrial fibrillation: Secondary | ICD-10-CM | POA: Diagnosis not present

## 2021-07-25 DIAGNOSIS — Z7409 Other reduced mobility: Secondary | ICD-10-CM | POA: Diagnosis not present

## 2021-07-25 DIAGNOSIS — N390 Urinary tract infection, site not specified: Secondary | ICD-10-CM | POA: Diagnosis not present

## 2021-07-25 DIAGNOSIS — M5459 Other low back pain: Secondary | ICD-10-CM | POA: Diagnosis not present

## 2021-07-25 DIAGNOSIS — Z20822 Contact with and (suspected) exposure to covid-19: Secondary | ICD-10-CM | POA: Diagnosis not present

## 2021-07-25 DIAGNOSIS — I480 Paroxysmal atrial fibrillation: Secondary | ICD-10-CM | POA: Diagnosis not present

## 2021-07-25 DIAGNOSIS — E1165 Type 2 diabetes mellitus with hyperglycemia: Secondary | ICD-10-CM | POA: Diagnosis not present

## 2021-07-25 DIAGNOSIS — R404 Transient alteration of awareness: Secondary | ICD-10-CM | POA: Diagnosis not present

## 2021-07-27 DIAGNOSIS — E119 Type 2 diabetes mellitus without complications: Secondary | ICD-10-CM | POA: Diagnosis not present

## 2021-07-27 DIAGNOSIS — R627 Adult failure to thrive: Secondary | ICD-10-CM | POA: Diagnosis not present

## 2021-07-27 DIAGNOSIS — I4891 Unspecified atrial fibrillation: Secondary | ICD-10-CM | POA: Diagnosis not present

## 2021-07-27 DIAGNOSIS — M47812 Spondylosis without myelopathy or radiculopathy, cervical region: Secondary | ICD-10-CM | POA: Diagnosis not present

## 2021-07-29 DIAGNOSIS — R627 Adult failure to thrive: Secondary | ICD-10-CM | POA: Diagnosis not present

## 2021-07-29 DIAGNOSIS — M47812 Spondylosis without myelopathy or radiculopathy, cervical region: Secondary | ICD-10-CM | POA: Diagnosis not present

## 2021-07-29 DIAGNOSIS — I4891 Unspecified atrial fibrillation: Secondary | ICD-10-CM | POA: Diagnosis not present

## 2021-07-29 DIAGNOSIS — E119 Type 2 diabetes mellitus without complications: Secondary | ICD-10-CM | POA: Diagnosis not present

## 2021-07-29 DIAGNOSIS — E1165 Type 2 diabetes mellitus with hyperglycemia: Secondary | ICD-10-CM | POA: Diagnosis not present

## 2021-07-30 ENCOUNTER — Ambulatory Visit: Payer: Medicare Other

## 2021-08-05 ENCOUNTER — Ambulatory Visit: Payer: Medicare Other

## 2021-08-07 ENCOUNTER — Other Ambulatory Visit: Payer: Self-pay | Admitting: Internal Medicine

## 2021-08-07 DIAGNOSIS — Z853 Personal history of malignant neoplasm of breast: Secondary | ICD-10-CM

## 2021-08-08 DIAGNOSIS — N390 Urinary tract infection, site not specified: Secondary | ICD-10-CM | POA: Diagnosis not present

## 2021-08-08 DIAGNOSIS — Z7984 Long term (current) use of oral hypoglycemic drugs: Secondary | ICD-10-CM | POA: Diagnosis not present

## 2021-08-08 DIAGNOSIS — I10 Essential (primary) hypertension: Secondary | ICD-10-CM | POA: Diagnosis not present

## 2021-08-08 DIAGNOSIS — E871 Hypo-osmolality and hyponatremia: Secondary | ICD-10-CM | POA: Diagnosis not present

## 2021-08-08 DIAGNOSIS — M50321 Other cervical disc degeneration at C4-C5 level: Secondary | ICD-10-CM | POA: Diagnosis not present

## 2021-08-08 DIAGNOSIS — E1140 Type 2 diabetes mellitus with diabetic neuropathy, unspecified: Secondary | ICD-10-CM | POA: Diagnosis not present

## 2021-08-09 ENCOUNTER — Encounter: Payer: Self-pay | Admitting: Internal Medicine

## 2021-08-09 DIAGNOSIS — D649 Anemia, unspecified: Secondary | ICD-10-CM | POA: Insufficient documentation

## 2021-08-09 NOTE — Progress Notes (Signed)
? ? ? ? ?Subjective:  ? ? Patient ID: Tami Jacobson, female    DOB: 27-Jun-1927, 88 y.o.   MRN: [id number] ? ?This visit occurred during the SARS-CoV-2 public health emergency.  Safety protocols were in place, including screening questions prior to the visit, additional usage of staff PPE, and extensive cleaning of exam room while observing appropriate contact time as indicated for disinfecting solutions.   ? ? ?HPI ?Tami Jacobson is here for follow up of her chronic medical problems, including htn, DM, gerd, B12 def ? ?Pain in right buttock region - dr Rushie Nyhan - MRI of l spine - rputured disc - was put on gabapentin. Fell back wards and hit head.  Dehydated, gave her fluids.  Went to rehab and just got home.  Eating better since being home.  Will start getting food from insurance x 2 weeks.  ? ?Pain is now from groin to right knee.  No neck pain.   ? ?BP low yesterday.   ? ?Will do PT this week at home.     ? ?Medications and allergies reviewed with patient and updated if appropriate. ? ?Current Outpatient Medications on File Prior to Visit  ?Medication Sig Dispense Refill  ? Accu-Chek Softclix Lancets lancets Use as instructed 100 each 4  ? acetaminophen (TYLENOL) 500 MG tablet Take 500 mg by mouth every 6 (six) hours as needed.    ? blood glucose meter kit and supplies KIT Dispense based on patient and insurance preference. Check sugars once daily and as needed as directed. 1 each 0  ? clobetasol cream (TEMOVATE) 7.86 % Apply 1 application topically as needed (for skin irritation- to affected areas).     ? cyanocobalamin (,VITAMIN B-12,) 1000 MCG/ML injection Inject 1,000 mcg into the muscle every 30 (thirty) days.    ? flecainide (TAMBOCOR) 50 MG tablet TAKE 1 TABLET BY MOUTH  TWICE DAILY 180 tablet 3  ? glucose blood (ACCU-CHEK GUIDE) test strip USE AS DIRECTED 100 strip 4  ? losartan (COZAAR) 100 MG tablet TAKE 1 TABLET BY MOUTH  DAILY 90 tablet 3  ? metFORMIN (GLUCOPHAGE-XR) 500 MG 24 hr tablet TAKE 1 TABLET  BY MOUTH ONCE DAILY WITH BREAKFAST 90 tablet 3  ? metoprolol succinate (TOPROL-XL) 50 MG 24 hr tablet TAKE 1 AND 1/2 TABLETS BY  MOUTH DAILY . TAKE WITH OR  IMMEDIATELY FOLLOWING A  MEAL 135 tablet 3  ? omeprazole (PRILOSEC) 40 MG capsule TAKE 1 CAPSULE BY MOUTH  DAILY 90 capsule 3  ? tamoxifen (NOLVADEX) 20 MG tablet TAKE 1 TABLET BY MOUTH  DAILY 90 tablet 3  ? terconazole (TERAZOL 7) 0.4 % vaginal cream Place 1 applicator vaginally at bedtime.    ? ?No current facility-administered medications on file prior to visit.  ? ? ? ?Review of Systems  ?Constitutional:  Negative for fever.  ?Respiratory:  Positive for shortness of breath (w/ moderate exertion). Negative for cough and wheezing.   ?Cardiovascular:  Positive for leg swelling (yesterday morning). Negative for chest pain and palpitations.  ?Neurological:  Negative for light-headedness and headaches.  ? ?   ?Objective:  ? ?Vitals:  ? 08/10/21 1053  ?BP: 104/60  ?Pulse: 60  ?Temp: 98 ?F (36.7 ?C)  ?SpO2: 95%  ? ?BP Readings from Last 3 Encounters:  ?08/10/21 104/60  ?03/11/21 128/78  ?12/30/20 130/70  ? ?Wt Readings from Last 3 Encounters:  ?08/10/21 116 lb (52.6 kg)  ?03/11/21 128 lb (58.1 kg)  ?12/30/20 127 lb (57.6 kg)  ? ?  Body mass index is 20.55 kg/m?. ? ?  ?Physical Exam ?Constitutional:   ?   General: She is not in acute distress. ?   Appearance: Normal appearance.  ?HENT:  ?   Head: Normocephalic and atraumatic.  ?Eyes:  ?   Conjunctiva/sclera: Conjunctivae normal.  ?Cardiovascular:  ?   Rate and Rhythm: Normal rate and regular rhythm.  ?   Heart sounds: Murmur (3/6) heard.  ?Pulmonary:  ?   Effort: Pulmonary effort is normal. No respiratory distress.  ?   Breath sounds: Normal breath sounds. No wheezing.  ?Musculoskeletal:  ?   Cervical back: Neck supple.  ?   Right lower leg: No edema.  ?   Left lower leg: No edema.  ?Lymphadenopathy:  ?   Cervical: No cervical adenopathy.  ?Skin: ?   General: Skin is warm and dry.  ?   Findings: No rash.   ?Neurological:  ?   Mental Status: She is alert. Mental status is at baseline.  ?Psychiatric:     ?   Mood and Affect: Mood normal.     ?   Behavior: Behavior normal.  ? ?   ? ?Lab Results  ?Component Value Date  ? WBC 6.2 03/11/2021  ? HGB 10.2 (L) 03/11/2021  ? HCT 29.9 (L) 03/11/2021  ? PLT 184.0 03/11/2021  ? GLUCOSE 226 (H) 03/11/2021  ? CHOL 173 02/22/2019  ? TRIG 157.0 (H) 02/22/2019  ? HDL 50.40 02/22/2019  ? LDLDIRECT 128.3 12/26/2012  ? [place] 92 02/22/2019  ? ALT 11 03/11/2021  ? AST 13 03/11/2021  ? NA 135 03/11/2021  ? K 4.7 03/11/2021  ? CL 100 03/11/2021  ? CREATININE 0.79 03/11/2021  ? BUN 13 03/11/2021  ? CO2 30 03/11/2021  ? TSH 2.519 05/15/2019  ? HGBA1C 7.0 (H) 03/11/2021  ? MICROALBUR 1.0 08/23/2018  ? ? ? ?Assessment & Plan:  ? ? ?See Problem List for Assessment and Plan of chronic medical problems.  ? ? ?

## 2021-08-09 NOTE — Patient Instructions (Addendum)
? ? ? ? ?  Medications changes include :   decrease the losartan to 50 mg daily ? ?Monitor your BP at home - call with concerns.   Call if your BP is less than 110/60 ? ? ?Your prescription(s) have been sent to your pharmacy.  ? ? ? ?Return in about 3 months (around 11/10/2021) for follow up. ? ?

## 2021-08-10 ENCOUNTER — Telehealth: Payer: Self-pay | Admitting: Internal Medicine

## 2021-08-10 ENCOUNTER — Ambulatory Visit (INDEPENDENT_AMBULATORY_CARE_PROVIDER_SITE_OTHER): Payer: Medicare Other | Admitting: Internal Medicine

## 2021-08-10 VITALS — BP 104/60 | HR 60 | Temp 98.0°F | Ht 63.0 in | Wt 116.0 lb

## 2021-08-10 DIAGNOSIS — N390 Urinary tract infection, site not specified: Secondary | ICD-10-CM | POA: Diagnosis not present

## 2021-08-10 DIAGNOSIS — K219 Gastro-esophageal reflux disease without esophagitis: Secondary | ICD-10-CM

## 2021-08-10 DIAGNOSIS — E538 Deficiency of other specified B group vitamins: Secondary | ICD-10-CM | POA: Diagnosis not present

## 2021-08-10 DIAGNOSIS — M50321 Other cervical disc degeneration at C4-C5 level: Secondary | ICD-10-CM | POA: Diagnosis not present

## 2021-08-10 DIAGNOSIS — I10 Essential (primary) hypertension: Secondary | ICD-10-CM

## 2021-08-10 DIAGNOSIS — E118 Type 2 diabetes mellitus with unspecified complications: Secondary | ICD-10-CM | POA: Diagnosis not present

## 2021-08-10 DIAGNOSIS — D649 Anemia, unspecified: Secondary | ICD-10-CM | POA: Diagnosis not present

## 2021-08-10 DIAGNOSIS — E1140 Type 2 diabetes mellitus with diabetic neuropathy, unspecified: Secondary | ICD-10-CM | POA: Diagnosis not present

## 2021-08-10 DIAGNOSIS — Z7984 Long term (current) use of oral hypoglycemic drugs: Secondary | ICD-10-CM | POA: Diagnosis not present

## 2021-08-10 DIAGNOSIS — M5416 Radiculopathy, lumbar region: Secondary | ICD-10-CM | POA: Diagnosis not present

## 2021-08-10 DIAGNOSIS — E871 Hypo-osmolality and hyponatremia: Secondary | ICD-10-CM | POA: Diagnosis not present

## 2021-08-10 MED ORDER — LOSARTAN POTASSIUM 50 MG PO TABS: 50.0000 mg | ORAL_TABLET | Freq: Every day | ORAL | 1 refills | Status: DC

## 2021-08-10 MED ORDER — CYANOCOBALAMIN 1000 MCG/ML IJ SOLN
1000.0000 ug | Freq: Once | INTRAMUSCULAR | Status: AC
  Administered 2021-08-10: 1000 ug via INTRAMUSCULAR

## 2021-08-10 NOTE — Assessment & Plan Note (Addendum)
Chronic GERD controlled Continue omeprazole 40 mg daily 

## 2021-08-10 NOTE — Addendum Note (Signed)
Addended by: Marcina Millard on: 08/10/2021 01:18 PM ? ? Modules accepted: Orders ? ?

## 2021-08-10 NOTE — Assessment & Plan Note (Addendum)
Chronic ?Blood pressure low today and yesterday  ?Decrease losartan 50 mg daily ?Metoprolol XL 75 mg daily ?

## 2021-08-10 NOTE — Assessment & Plan Note (Signed)
Chronic ?Lab Results  ?Component Value Date  ? HGBA1C 7.0 (H) 03/11/2021  ? ?Sugars controlled ?Check A1c  ?Continue metformin XR 500 mg daily ?Stressed regular exercise, diabetic diet ?Will adjust medication as needed ? ?

## 2021-08-10 NOTE — Assessment & Plan Note (Signed)
Last blood work showed mild anemia ?Check CBC, iron panel ?

## 2021-08-10 NOTE — Telephone Encounter (Signed)
Tami Jacobson had a home health occupational therapy evaluation on 08/10/2021.  She does not require any other further visit. ?

## 2021-08-10 NOTE — Assessment & Plan Note (Signed)
Subacute ?Seeing Dr Gladstone Lighter ?Did not tolerate gabapentin ?Taking tylenol - continue ?Will start home PT this week ?

## 2021-08-10 NOTE — Assessment & Plan Note (Signed)
Chronic ?Has history of poor oral absorption of B12 so continue B12 injections monthly indefinitely ?B12 injection today ?

## 2021-08-11 DIAGNOSIS — E871 Hypo-osmolality and hyponatremia: Secondary | ICD-10-CM | POA: Diagnosis not present

## 2021-08-11 DIAGNOSIS — I10 Essential (primary) hypertension: Secondary | ICD-10-CM | POA: Diagnosis not present

## 2021-08-11 DIAGNOSIS — N390 Urinary tract infection, site not specified: Secondary | ICD-10-CM | POA: Diagnosis not present

## 2021-08-11 DIAGNOSIS — M50321 Other cervical disc degeneration at C4-C5 level: Secondary | ICD-10-CM | POA: Diagnosis not present

## 2021-08-11 DIAGNOSIS — Z7984 Long term (current) use of oral hypoglycemic drugs: Secondary | ICD-10-CM | POA: Diagnosis not present

## 2021-08-11 DIAGNOSIS — E1140 Type 2 diabetes mellitus with diabetic neuropathy, unspecified: Secondary | ICD-10-CM | POA: Diagnosis not present

## 2021-08-14 ENCOUNTER — Telehealth: Payer: Self-pay

## 2021-08-14 DIAGNOSIS — Z7984 Long term (current) use of oral hypoglycemic drugs: Secondary | ICD-10-CM | POA: Diagnosis not present

## 2021-08-14 DIAGNOSIS — E1140 Type 2 diabetes mellitus with diabetic neuropathy, unspecified: Secondary | ICD-10-CM | POA: Diagnosis not present

## 2021-08-14 DIAGNOSIS — M5459 Other low back pain: Secondary | ICD-10-CM | POA: Diagnosis not present

## 2021-08-14 DIAGNOSIS — M50321 Other cervical disc degeneration at C4-C5 level: Secondary | ICD-10-CM | POA: Diagnosis not present

## 2021-08-14 DIAGNOSIS — N390 Urinary tract infection, site not specified: Secondary | ICD-10-CM | POA: Diagnosis not present

## 2021-08-14 DIAGNOSIS — I10 Essential (primary) hypertension: Secondary | ICD-10-CM | POA: Diagnosis not present

## 2021-08-14 DIAGNOSIS — E871 Hypo-osmolality and hyponatremia: Secondary | ICD-10-CM | POA: Diagnosis not present

## 2021-08-14 NOTE — Telephone Encounter (Signed)
Spoke with patient today. ? ?She will try Imodium over the weekend and she states her diarrhea has slowed down some as well.  ?

## 2021-08-14 NOTE — Telephone Encounter (Signed)
Patient called back and states she is also having diarrhea and wants to let MD know it has been about 2 days now.  ?

## 2021-08-14 NOTE — Telephone Encounter (Signed)
Pt is calling to report that the dry mouth has not improved with medication changes. Pt states that her mouth is still really dry and wonders if its due to dehydration. Pt states she drinks maybe a qt a day. ? ?Please advise ?

## 2021-08-17 ENCOUNTER — Telehealth: Payer: Self-pay | Admitting: Internal Medicine

## 2021-08-17 NOTE — Telephone Encounter (Signed)
Verbals given to [place] today ?

## 2021-08-17 NOTE — Telephone Encounter (Signed)
Okay for orders? 

## 2021-08-17 NOTE — Telephone Encounter (Signed)
Liji with [facility] calls today in regards to getting verbal orders for PT. Liji requires physical therapy orders for ? ?Once a week for one ?Twice a week for three ?Once a week for three ? ?This is to help increase gate strength! Verbal orders can be given at ? ?CB: [phone] ?

## 2021-08-18 NOTE — Progress Notes (Signed)
HPI:FU palpitations and cerebrovascular disease; history of PACs and PVCs with normal LV function. She had significant palpitations in the past despite beta-blockade. She is therefore on flecainide 50 mg p.o. b.i.d. A previous catheterization in June 2000 showed normal coronary arteries and normal LV function. Nuclear study February 2017 showed ejection fraction 72% and normal perfusion. Carotid dopplers 2/18 showed 1-39 bilateral stenosis. Chest CT April 2021 showed biapical scarring and coronary artery calcification. Echocardiogram December 2021 showed normal LV function, severe left ventricular hypertrophy, grade 2 diastolic dysfunction, mild left atrial enlargement, mild mitral regurgitation.  Since I last saw her, she denies dyspnea, chest pain, palpitations or syncope.  Current Outpatient Medications  Medication Sig Dispense Refill   Accu-Chek Softclix Lancets lancets Use as instructed 100 each 4   acetaminophen (TYLENOL) 500 MG tablet Take 500 mg by mouth every 6 (six) hours as needed.     blood glucose meter kit and supplies KIT Dispense based on patient and insurance preference. Check sugars once daily and as needed as directed. 1 each 0   cyanocobalamin (,VITAMIN B-12,) 1000 MCG/ML injection Inject 1,000 mcg into the muscle every 30 (thirty) days.     flecainide (TAMBOCOR) 50 MG tablet TAKE 1 TABLET BY MOUTH  TWICE DAILY 180 tablet 3   glucose blood (ACCU-CHEK GUIDE) test strip USE AS DIRECTED 100 strip 4   losartan (COZAAR) 50 MG tablet Take 1 tablet (50 mg total) by mouth daily. 90 tablet 1   metFORMIN (GLUCOPHAGE-XR) 500 MG 24 hr tablet TAKE 1 TABLET BY MOUTH ONCE DAILY WITH BREAKFAST 90 tablet 3   metoprolol succinate (TOPROL-XL) 50 MG 24 hr tablet TAKE 1 AND 1/2 TABLETS BY  MOUTH DAILY . TAKE WITH OR  IMMEDIATELY FOLLOWING A  MEAL 135 tablet 3   omeprazole (PRILOSEC) 40 MG capsule TAKE 1 CAPSULE BY MOUTH  DAILY 90 capsule 3   tamoxifen (NOLVADEX) 20 MG tablet TAKE 1 TABLET BY  MOUTH  DAILY 90 tablet 3   No current facility-administered medications for this visit.     Past Medical History:  Diagnosis Date   Arthritis    left hip   Breast cancer, right ([facility]) 11/2016   Family history of breast cancer    Family history of colon cancer    Family history of pancreatic cancer    GERD (gastroesophageal reflux disease)    Non-insulin dependent type 2 diabetes mellitus ([place])    PAC (premature atrial contraction)    Personal history of radiation therapy 12/2016   Premature ventricular contraction    Seasonal allergies    Sensitive skin    Vitamin B12 deficiency     Past Surgical History:  Procedure Laterality Date   ABDOMINAL HYSTERECTOMY  1979   partial   BREAST BIOPSY  07/02/2011   Procedure: BREAST BIOPSY WITH NEEDLE LOCALIZATION;  Surgeon: Shann Medal, MD;  Location: [facility];  Service: General;  Laterality: Left;  left breast atypical hyperplasia needle localization biopsy   BREAST BIOPSY Left 06/13/2006   BREAST BIOPSY Right 06/27/2008   BREAST BIOPSY Right 09/27/2016   BREAST EXCISIONAL BIOPSY Left 07/02/2011   BREAST LUMPECTOMY Left 07/04/2006   BREAST LUMPECTOMY Right 06/26/2008   BREAST LUMPECTOMY Right 09/27/2016   BREAST LUMPECTOMY WITH RADIOACTIVE SEED LOCALIZATION Right 11/18/2016   Procedure: RIGHT BREAST LUMPECTOMY WITH RADIOACTIVE SEED LOCALIZATION ERAS PATHWAY;  Surgeon: Alphonsa Overall, MD;  Location: [facility];  Service: General;  Laterality: Right;  ERAS PATHWAY   CATARACT  EXTRACTION Bilateral    CHOLECYSTECTOMY, LAPAROSCOPIC  07/07/2001   had appendex taking out at the same time   ESOPHAGEAL DILATION  2006   LAPAROSCOPIC APPENDECTOMY  07/07/2001   TONSILLECTOMY  1942    Social History   Socioeconomic History   Marital status: Divorced    Spouse name: Marcello Moores   Number of children: 1   Years of education: Not on file   Highest education level: Not on file  Occupational History   Occupation: retire  Tobacco Use    Smoking status: Never   Smokeless tobacco: Never  Vaping Use   Vaping Use: Never used  Substance and Sexual Activity   Alcohol use: No    Alcohol/week: 0.0 standard drinks   Drug use: No   Sexual activity: Not Currently  Other Topics Concern   Not on file  Social History Narrative   Not on file   Social Determinants of Health   Financial Resource Strain: Low Risk    Difficulty of Paying Living Expenses: Not hard at all  Food Insecurity: No Food Insecurity   Worried About Charity fundraiser in the Last Year: Never true   [place] in the Last Year: Never true  Transportation Needs: No Transportation Needs   Lack of Transportation (Medical): No   Lack of Transportation (Non-Medical): No  Physical Activity: Inactive   Days of Exercise per Week: 0 days   Minutes of Exercise per Session: 0 min  Stress: No Stress Concern Present   Feeling of Stress : Not at all  Social Connections: Moderately Integrated   Frequency of Communication with Friends and Family: More than three times a week   Frequency of Social Gatherings with Friends and Family: More than three times a week   Attends Religious Services: More than 4 times per year   Active Member of Clubs or Organizations: No   Attends Music therapist: More than 4 times per year   Marital Status: Widowed  Human resources officer Violence: Not At Risk   Fear of Current or Ex-Partner: No   Emotionally Abused: No   Physically Abused: No   Sexually Abused: No    Family History  Problem Relation Age of Onset   Colon cancer Mother 58   Heart attack Mother    Breast cancer Sister 71   Heart disease Maternal Uncle        CABG   Breast cancer Sister 41   Heart disease Brother    Other Father        suicide   Breast cancer Sister 22   Heart disease Sister    COPD Brother    Heart disease Brother    Breast cancer Other 31       niece - brother's daughter   Pancreatic cancer Other        brother's son, dx in his  62s    ROS: Leg pain from disc problem but no fevers or chills, productive cough, hemoptysis, dysphasia, odynophagia, melena, hematochezia, dysuria, hematuria, rash, seizure activity, orthopnea, PND, pedal edema, claudication. Remaining systems are negative.  Physical Exam: Well-developed well-nourished in no acute distress.  Skin is warm and dry.  HEENT is normal.  Neck is supple.  Chest is clear to auscultation with normal expansion.  Cardiovascular exam is regular rate and rhythm.  Abdominal exam nontender or distended. No masses palpated. Extremities show no edema. neuro grossly intact  ECG-normal sinus rhythm at a rate of 70, cannot rule out  inferior infarct.  Personally reviewed  A/P  1 coronary artery disease-based on previous CT scan demonstrating coronary calcification.  She is not having chest pain.  Continue aspirin.  She is intolerant to statins.  2 hypertension-patient's blood pressure is controlled.  Continue present medical regimen.  3 palpitations-reasonly well controlled.  Continue beta-blocker at present dose.  Continue flecainide as her symptoms were not controlled with beta-blockade alone.  Note she has never had documented obstructive coronary disease but merely coronary calcification.  She has tolerated flecainide for years.  4 carotid artery disease-mild on most recent Dopplers.  Kirk Ruths, MD

## 2021-08-19 DIAGNOSIS — E871 Hypo-osmolality and hyponatremia: Secondary | ICD-10-CM | POA: Diagnosis not present

## 2021-08-19 DIAGNOSIS — I10 Essential (primary) hypertension: Secondary | ICD-10-CM | POA: Diagnosis not present

## 2021-08-19 DIAGNOSIS — N390 Urinary tract infection, site not specified: Secondary | ICD-10-CM | POA: Diagnosis not present

## 2021-08-19 DIAGNOSIS — M50321 Other cervical disc degeneration at C4-C5 level: Secondary | ICD-10-CM | POA: Diagnosis not present

## 2021-08-19 DIAGNOSIS — Z7984 Long term (current) use of oral hypoglycemic drugs: Secondary | ICD-10-CM | POA: Diagnosis not present

## 2021-08-19 DIAGNOSIS — E1140 Type 2 diabetes mellitus with diabetic neuropathy, unspecified: Secondary | ICD-10-CM | POA: Diagnosis not present

## 2021-08-20 DIAGNOSIS — Z7984 Long term (current) use of oral hypoglycemic drugs: Secondary | ICD-10-CM | POA: Diagnosis not present

## 2021-08-20 DIAGNOSIS — I10 Essential (primary) hypertension: Secondary | ICD-10-CM | POA: Diagnosis not present

## 2021-08-20 DIAGNOSIS — E871 Hypo-osmolality and hyponatremia: Secondary | ICD-10-CM | POA: Diagnosis not present

## 2021-08-20 DIAGNOSIS — N390 Urinary tract infection, site not specified: Secondary | ICD-10-CM | POA: Diagnosis not present

## 2021-08-20 DIAGNOSIS — M50321 Other cervical disc degeneration at C4-C5 level: Secondary | ICD-10-CM | POA: Diagnosis not present

## 2021-08-20 DIAGNOSIS — E1140 Type 2 diabetes mellitus with diabetic neuropathy, unspecified: Secondary | ICD-10-CM | POA: Diagnosis not present

## 2021-08-21 DIAGNOSIS — N390 Urinary tract infection, site not specified: Secondary | ICD-10-CM | POA: Diagnosis not present

## 2021-08-21 DIAGNOSIS — E1140 Type 2 diabetes mellitus with diabetic neuropathy, unspecified: Secondary | ICD-10-CM | POA: Diagnosis not present

## 2021-08-21 DIAGNOSIS — Z7984 Long term (current) use of oral hypoglycemic drugs: Secondary | ICD-10-CM | POA: Diagnosis not present

## 2021-08-21 DIAGNOSIS — M50321 Other cervical disc degeneration at C4-C5 level: Secondary | ICD-10-CM | POA: Diagnosis not present

## 2021-08-21 DIAGNOSIS — I10 Essential (primary) hypertension: Secondary | ICD-10-CM | POA: Diagnosis not present

## 2021-08-21 DIAGNOSIS — E871 Hypo-osmolality and hyponatremia: Secondary | ICD-10-CM | POA: Diagnosis not present

## 2021-08-24 DIAGNOSIS — E1140 Type 2 diabetes mellitus with diabetic neuropathy, unspecified: Secondary | ICD-10-CM | POA: Diagnosis not present

## 2021-08-24 DIAGNOSIS — E871 Hypo-osmolality and hyponatremia: Secondary | ICD-10-CM | POA: Diagnosis not present

## 2021-08-24 DIAGNOSIS — Z7984 Long term (current) use of oral hypoglycemic drugs: Secondary | ICD-10-CM | POA: Diagnosis not present

## 2021-08-24 DIAGNOSIS — M50321 Other cervical disc degeneration at C4-C5 level: Secondary | ICD-10-CM | POA: Diagnosis not present

## 2021-08-24 DIAGNOSIS — N390 Urinary tract infection, site not specified: Secondary | ICD-10-CM | POA: Diagnosis not present

## 2021-08-24 DIAGNOSIS — I10 Essential (primary) hypertension: Secondary | ICD-10-CM | POA: Diagnosis not present

## 2021-08-25 ENCOUNTER — Other Ambulatory Visit: Payer: Self-pay | Admitting: Internal Medicine

## 2021-08-26 ENCOUNTER — Ambulatory Visit: Payer: Medicare Other

## 2021-08-26 ENCOUNTER — Ambulatory Visit: Payer: Medicare Other | Admitting: Cardiology

## 2021-08-26 ENCOUNTER — Encounter: Payer: Self-pay | Admitting: Cardiology

## 2021-08-26 VITALS — BP 140/60 | HR 70 | Ht 63.0 in | Wt 119.0 lb

## 2021-08-26 DIAGNOSIS — M50321 Other cervical disc degeneration at C4-C5 level: Secondary | ICD-10-CM | POA: Diagnosis not present

## 2021-08-26 DIAGNOSIS — I2510 Atherosclerotic heart disease of native coronary artery without angina pectoris: Secondary | ICD-10-CM | POA: Diagnosis not present

## 2021-08-26 DIAGNOSIS — R002 Palpitations: Secondary | ICD-10-CM | POA: Diagnosis not present

## 2021-08-26 DIAGNOSIS — E871 Hypo-osmolality and hyponatremia: Secondary | ICD-10-CM | POA: Diagnosis not present

## 2021-08-26 DIAGNOSIS — E1140 Type 2 diabetes mellitus with diabetic neuropathy, unspecified: Secondary | ICD-10-CM | POA: Diagnosis not present

## 2021-08-26 DIAGNOSIS — I10 Essential (primary) hypertension: Secondary | ICD-10-CM | POA: Diagnosis not present

## 2021-08-26 DIAGNOSIS — I2584 Coronary atherosclerosis due to calcified coronary lesion: Secondary | ICD-10-CM

## 2021-08-26 DIAGNOSIS — N390 Urinary tract infection, site not specified: Secondary | ICD-10-CM | POA: Diagnosis not present

## 2021-08-26 DIAGNOSIS — Z7984 Long term (current) use of oral hypoglycemic drugs: Secondary | ICD-10-CM | POA: Diagnosis not present

## 2021-08-26 NOTE — Patient Instructions (Signed)

## 2021-08-28 ENCOUNTER — Telehealth: Payer: Self-pay | Admitting: Internal Medicine

## 2021-08-28 DIAGNOSIS — Z7984 Long term (current) use of oral hypoglycemic drugs: Secondary | ICD-10-CM | POA: Diagnosis not present

## 2021-08-28 DIAGNOSIS — N390 Urinary tract infection, site not specified: Secondary | ICD-10-CM | POA: Diagnosis not present

## 2021-08-28 DIAGNOSIS — E871 Hypo-osmolality and hyponatremia: Secondary | ICD-10-CM | POA: Diagnosis not present

## 2021-08-28 DIAGNOSIS — M50321 Other cervical disc degeneration at C4-C5 level: Secondary | ICD-10-CM | POA: Diagnosis not present

## 2021-08-28 DIAGNOSIS — I10 Essential (primary) hypertension: Secondary | ICD-10-CM | POA: Diagnosis not present

## 2021-08-28 DIAGNOSIS — E1140 Type 2 diabetes mellitus with diabetic neuropathy, unspecified: Secondary | ICD-10-CM | POA: Diagnosis not present

## 2021-08-28 NOTE — Telephone Encounter (Signed)
Tami Jacobson with [facility] calls today with a request for a social work evaluation. Tami Jacobson suggests this to help assist with PT's resources.  Orders can be given verbally at  [facility]: [phone]

## 2021-08-28 NOTE — Telephone Encounter (Signed)
Okay for orders? 

## 2021-09-01 ENCOUNTER — Other Ambulatory Visit: Payer: Self-pay | Admitting: Cardiology

## 2021-09-01 DIAGNOSIS — Z7984 Long term (current) use of oral hypoglycemic drugs: Secondary | ICD-10-CM | POA: Diagnosis not present

## 2021-09-01 DIAGNOSIS — E871 Hypo-osmolality and hyponatremia: Secondary | ICD-10-CM | POA: Diagnosis not present

## 2021-09-01 DIAGNOSIS — E1140 Type 2 diabetes mellitus with diabetic neuropathy, unspecified: Secondary | ICD-10-CM | POA: Diagnosis not present

## 2021-09-01 DIAGNOSIS — N390 Urinary tract infection, site not specified: Secondary | ICD-10-CM | POA: Diagnosis not present

## 2021-09-01 DIAGNOSIS — M50321 Other cervical disc degeneration at C4-C5 level: Secondary | ICD-10-CM | POA: Diagnosis not present

## 2021-09-01 DIAGNOSIS — I10 Essential (primary) hypertension: Secondary | ICD-10-CM | POA: Diagnosis not present

## 2021-09-02 NOTE — Telephone Encounter (Signed)
Verbals given today. °

## 2021-09-03 ENCOUNTER — Other Ambulatory Visit: Payer: Self-pay | Admitting: Internal Medicine

## 2021-09-03 DIAGNOSIS — Z1231 Encounter for screening mammogram for malignant neoplasm of breast: Secondary | ICD-10-CM

## 2021-09-04 ENCOUNTER — Other Ambulatory Visit: Payer: Self-pay | Admitting: Cardiology

## 2021-09-04 DIAGNOSIS — I10 Essential (primary) hypertension: Secondary | ICD-10-CM | POA: Diagnosis not present

## 2021-09-04 DIAGNOSIS — E1140 Type 2 diabetes mellitus with diabetic neuropathy, unspecified: Secondary | ICD-10-CM | POA: Diagnosis not present

## 2021-09-04 DIAGNOSIS — M50321 Other cervical disc degeneration at C4-C5 level: Secondary | ICD-10-CM | POA: Diagnosis not present

## 2021-09-04 DIAGNOSIS — N390 Urinary tract infection, site not specified: Secondary | ICD-10-CM | POA: Diagnosis not present

## 2021-09-04 DIAGNOSIS — E871 Hypo-osmolality and hyponatremia: Secondary | ICD-10-CM | POA: Diagnosis not present

## 2021-09-04 DIAGNOSIS — Z7984 Long term (current) use of oral hypoglycemic drugs: Secondary | ICD-10-CM | POA: Diagnosis not present

## 2021-09-09 ENCOUNTER — Ambulatory Visit: Payer: Medicare Other | Admitting: Internal Medicine

## 2021-09-09 DIAGNOSIS — Z7984 Long term (current) use of oral hypoglycemic drugs: Secondary | ICD-10-CM | POA: Diagnosis not present

## 2021-09-09 DIAGNOSIS — M50321 Other cervical disc degeneration at C4-C5 level: Secondary | ICD-10-CM | POA: Diagnosis not present

## 2021-09-09 DIAGNOSIS — E871 Hypo-osmolality and hyponatremia: Secondary | ICD-10-CM | POA: Diagnosis not present

## 2021-09-09 DIAGNOSIS — E1140 Type 2 diabetes mellitus with diabetic neuropathy, unspecified: Secondary | ICD-10-CM | POA: Diagnosis not present

## 2021-09-09 DIAGNOSIS — N390 Urinary tract infection, site not specified: Secondary | ICD-10-CM | POA: Diagnosis not present

## 2021-09-09 DIAGNOSIS — I10 Essential (primary) hypertension: Secondary | ICD-10-CM | POA: Diagnosis not present

## 2021-09-10 ENCOUNTER — Encounter: Payer: Self-pay | Admitting: Internal Medicine

## 2021-09-10 ENCOUNTER — Ambulatory Visit: Payer: Medicare Other

## 2021-09-10 ENCOUNTER — Ambulatory Visit (INDEPENDENT_AMBULATORY_CARE_PROVIDER_SITE_OTHER): Payer: Medicare Other | Admitting: Internal Medicine

## 2021-09-10 VITALS — BP 164/70 | HR 63 | Temp 98.3°F | Ht 63.0 in | Wt 120.8 lb

## 2021-09-10 DIAGNOSIS — I10 Essential (primary) hypertension: Secondary | ICD-10-CM | POA: Diagnosis not present

## 2021-09-10 DIAGNOSIS — E118 Type 2 diabetes mellitus with unspecified complications: Secondary | ICD-10-CM | POA: Diagnosis not present

## 2021-09-10 DIAGNOSIS — E538 Deficiency of other specified B group vitamins: Secondary | ICD-10-CM | POA: Diagnosis not present

## 2021-09-10 DIAGNOSIS — M79604 Pain in right leg: Secondary | ICD-10-CM | POA: Diagnosis not present

## 2021-09-10 MED ORDER — CYANOCOBALAMIN 1000 MCG/ML IJ SOLN
1000.0000 ug | Freq: Once | INTRAMUSCULAR | Status: AC
  Administered 2021-09-10: 1000 ug via INTRAMUSCULAR

## 2021-09-10 MED ORDER — CELECOXIB 100 MG PO CAPS: 100.0000 mg | ORAL_CAPSULE | Freq: Two times a day (BID) | ORAL | 2 refills | Status: DC | PRN

## 2021-09-10 NOTE — Patient Instructions (Signed)
Please take all new medication as prescribed - the celebrex 100 mg twice per day as needed  Please return to see Dr Quay Burow in 1-2 wks to reassess and see if you need to check the kidney function blood test  Please continue all other medications as before, and refills have been done if requested.  Please have the pharmacy call with any other refills you may need.  Please keep your appointments with your specialists as you may have planned

## 2021-09-10 NOTE — Progress Notes (Unsigned)
Patient ID: Tami Jacobson, female   DOB: January 14, 1928, 88 y.o.   MRN: [id number]        Chief Complaint: follow up HTN, dm, right leg pain, low B12       HPI:  Tami Jacobson is a 88 y.o. female here with c/o chronic persistent right upper leg pain, followed closely per orthopedic but unfortunately pain persists; Pt has no change in recent hx verified today;      88 yo female here for f/u from a [facility] 07/21/2021. She states that her right leg is still hurting. She describes occasional sharp pains. No pain when sitting or lying down. She feels like her pain has improved. She can walk better. She was given Gabapentin but it made her sleepy all the time. She was in rehab due to a fall after having the last injection. She fell back and hit her head due to the Gabapentin making her sleepy. She went to the hospital and was sent to rehab where she spent 12 days there. She has been using the rollator to get around at home. She still has the aches but not having the pain as much as when she first came it. . She had a selective nerve root block that worked temporarily  No major groin issues but today pt state pain is from right lateral hip area to the right knee, mod, worse to stand and walk, constant without worsening LBP.  No further falls since fell in ED above.  Pt is asking specifically for celebrex 100 bid prn bc a friend has done well with that.  Also hoping this will her bilateral hand OA pain as well. Due for B12 shot today.  Pt states BP has been controlled < 140/90 at home.   Pt denies polydipsia, polyuria   Wt Readings from Last 3 Encounters:  09/10/21 120 lb 12.8 oz (54.8 kg)  08/26/21 119 lb (54 kg)  08/10/21 116 lb (52.6 kg)   BP Readings from Last 3 Encounters:  09/10/21 (!) 164/70  08/26/21 140/60  08/10/21 104/60         Past Medical History:  Diagnosis Date   Arthritis    left hip   Breast cancer, right ([place]) 11/2016   Family history of breast cancer    Family  history of colon cancer    Family history of pancreatic cancer    GERD (gastroesophageal reflux disease)    Non-insulin dependent type 2 diabetes mellitus ([place])    PAC (premature atrial contraction)    Personal history of radiation therapy 12/2016   Premature ventricular contraction    Seasonal allergies    Sensitive skin    Vitamin B12 deficiency    Past Surgical History:  Procedure Laterality Date   ABDOMINAL HYSTERECTOMY  1979   partial   BREAST BIOPSY  07/02/2011   Procedure: BREAST BIOPSY WITH NEEDLE LOCALIZATION;  Surgeon: Shann Medal, MD;  Location: [facility];  Service: General;  Laterality: Left;  left breast atypical hyperplasia needle localization biopsy   BREAST BIOPSY Left 06/13/2006   BREAST BIOPSY Right 06/27/2008   BREAST BIOPSY Right 09/27/2016   BREAST EXCISIONAL BIOPSY Left 07/02/2011   BREAST LUMPECTOMY Left 07/04/2006   BREAST LUMPECTOMY Right 06/26/2008   BREAST LUMPECTOMY Right 09/27/2016   BREAST LUMPECTOMY WITH RADIOACTIVE SEED LOCALIZATION Right 11/18/2016   Procedure: RIGHT BREAST LUMPECTOMY WITH RADIOACTIVE SEED LOCALIZATION ERAS PATHWAY;  Surgeon: Alphonsa Overall, MD;  Location: [facility];  Service: General;  Laterality: Right;  ERAS PATHWAY   CATARACT EXTRACTION Bilateral    CHOLECYSTECTOMY, LAPAROSCOPIC  07/07/2001   had appendex taking out at the same time   ESOPHAGEAL DILATION  2006   LAPAROSCOPIC APPENDECTOMY  07/07/2001   TONSILLECTOMY  1942    reports that she has never smoked. She has never used smokeless tobacco. She reports that she does not drink alcohol and does not use drugs. family history includes Breast cancer (age of onset: 68) in an other family member; Breast cancer (age of onset: 37) in her sister; Breast cancer (age of onset: 66) in her sister; Breast cancer (age of onset: 69) in her sister; COPD in her brother; Colon cancer (age of onset: 84) in her mother; Heart attack in her mother; Heart disease in her brother, brother,  maternal uncle, and sister; Other in her father; Pancreatic cancer in an other family member. Allergies  Allergen Reactions   Statins Other (See Comments)    GI UPSET   Effexor [Venlafaxine]     Constipation, shaking, dizzy   Gabapentin     drowsniess   Cephalexin Other (See Comments)    Reaction not recalled by the patient   Codeine Other (See Comments)    Reaction not recalled by the patient   Lansoprazole Other (See Comments)    Reaction not recalled by the patient   Latex Rash        Levofloxacin Other (See Comments)    Reaction not recalled by the patient   Nitrofurantoin Other (See Comments)    Reaction not recalled by the patient   Rofecoxib Other (See Comments)    Reaction not recalled by the patient    Current Outpatient Medications on File Prior to Visit  Medication Sig Dispense Refill   Accu-Chek Softclix Lancets lancets Use as instructed 100 each 4   acetaminophen (TYLENOL) 500 MG tablet Take 500 mg by mouth every 6 (six) hours as needed.     blood glucose meter kit and supplies KIT Dispense based on patient and insurance preference. Check sugars once daily and as needed as directed. 1 each 0   cyanocobalamin (,VITAMIN B-12,) 1000 MCG/ML injection Inject 1,000 mcg into the muscle every 30 (thirty) days.     flecainide (TAMBOCOR) 50 MG tablet TAKE 1 TABLET BY MOUTH  TWICE DAILY 180 tablet 3   glucose blood (ACCU-CHEK GUIDE) test strip USE AS DIRECTED 100 strip 4   losartan (COZAAR) 50 MG tablet Take 1 tablet (50 mg total) by mouth daily. 90 tablet 1   metFORMIN (GLUCOPHAGE-XR) 500 MG 24 hr tablet TAKE 1 TABLET BY MOUTH ONCE DAILY WITH BREAKFAST 90 tablet 3   metoprolol succinate (TOPROL-XL) 50 MG 24 hr tablet TAKE 1 AND 1/2 TABLETS BY  MOUTH DAILY . TAKE WITH OR  IMMEDIATELY FOLLOWING A  MEAL 135 tablet 3   omeprazole (PRILOSEC) 40 MG capsule TAKE 1 CAPSULE BY MOUTH  DAILY 90 capsule 3   tamoxifen (NOLVADEX) 20 MG tablet TAKE 1 TABLET BY MOUTH  DAILY 90 tablet 3    No current facility-administered medications on file prior to visit.        ROS:  All others reviewed and negative.  Objective        PE:  BP (!) 164/70 (BP Location: Left Arm, Patient Position: Sitting, Cuff Size: Normal)   Pulse 63   Temp 98.3 F (36.8 C) (Oral)   Ht _0  (1.6 m)   Wt 120 lb 12.8 oz (54.8 kg)  SpO2 99%   BMI 21.40 kg/m                 Constitutional: Pt appears in NAD               HENT: Head: NCAT.                Right Ear: External ear normal.                 Left Ear: External ear normal.                Eyes: . Pupils are equal, round, and reactive to light. Conjunctivae and EOM are normal               Nose: without d/c or deformity               Neck: Neck supple. Gross normal ROM               Cardiovascular: Normal rate and regular rhythm.                 Pulmonary/Chest: Effort normal and breath sounds without rales or wheezing.                Abd:  Soft, NT, ND, + BS, no organomegaly               Neurological: Pt is alert. At baseline orientation, motor grossly intact               Skin: Skin is warm. No rashes, no other new lesions, LE edema - none; has no tenderness rash or swelling to right upper leg, o/w neurovasc intact               Psychiatric: Pt behavior is normal without agitation   Micro: none  Cardiac tracings I have personally interpreted today:  none  Pertinent Radiological findings (summarize): none   Lab Results  Component Value Date   WBC 6.2 03/11/2021   HGB 10.2 (L) 03/11/2021   HCT 29.9 (L) 03/11/2021   PLT 184.0 03/11/2021   GLUCOSE 226 (H) 03/11/2021   CHOL 173 02/22/2019   TRIG 157.0 (H) 02/22/2019   HDL 50.40 02/22/2019   LDLDIRECT 128.3 12/26/2012   LDLCALC 92 02/22/2019   ALT 11 03/11/2021   AST 13 03/11/2021   NA 135 03/11/2021   K 4.7 03/11/2021   CL 100 03/11/2021   CREATININE 0.79 03/11/2021   BUN 13 03/11/2021   CO2 30 03/11/2021   TSH 2.519 05/15/2019   HGBA1C 7.0 (H) 03/11/2021   MICROALBUR  1.0 08/23/2018   Assessment/Plan:  Gwendalyn Mcgonagle is a 88 y.o. White or Caucasian [1] female with  has a past medical history of Arthritis, Breast cancer, right ([place]) (11/2016), Family history of breast cancer, Family history of colon cancer, Family history of pancreatic cancer, GERD (gastroesophageal reflux disease), Non-insulin dependent type 2 diabetes mellitus ([place]), PAC (premature atrial contraction), Personal history of radiation therapy (12/2016), Premature ventricular contraction, Seasonal allergies, Sensitive skin, and Vitamin B12 deficiency.  Right leg pain Right upper leg as above, ? Neuritic vs other, declines cymbalta trial, declines right hip MRI, for celebrex 100 bid prn,   Essential hypertension BP Readings from Last 3 Encounters:  09/10/21 (!) 164/70  08/26/21 140/60  08/10/21 104/60   Uncontrolled, likely reactive it seems, pt states BP controlled at home, pt to continue medical treatment losartan 50 mg qd, toprol xl 50 qd  B12 deficiency Lab Results  Component Value Date   [id number] 384 12/05/2018   Stable, cont IM replacement - b12 1000 mcg IM monthly  Diabetes mellitus type 2, controlled ([place]) Lab Results  Component Value Date   HGBA1C 7.0 (H) 03/11/2021   Stable, pt to continue current medical treatment metfomrin ER 500 -1  qd  Followup: Return if symptoms worsen or fail to improve.  Cathlean Cower, MD 09/12/2021 6:02 PM [facility]

## 2021-09-12 ENCOUNTER — Encounter: Payer: Self-pay | Admitting: Internal Medicine

## 2021-09-12 DIAGNOSIS — M79604 Pain in right leg: Secondary | ICD-10-CM | POA: Insufficient documentation

## 2021-09-12 NOTE — Assessment & Plan Note (Signed)
Lab Results  Component Value Date   [id number] 280 12/05/2018   Stable, cont IM replacement - b12 1000 mcg IM monthly

## 2021-09-12 NOTE — Assessment & Plan Note (Signed)
Lab Results  Component Value Date   HGBA1C 7.0 (H) 03/11/2021   Stable, pt to continue current medical treatment metfomrin ER 500 -1  qd

## 2021-09-12 NOTE — Assessment & Plan Note (Signed)
BP Readings from Last 3 Encounters:  09/10/21 (!) 164/70  08/26/21 140/60  08/10/21 104/60   Uncontrolled, likely reactive it seems, pt states BP controlled at home, pt to continue medical treatment losartan 50 mg qd, toprol xl 50 qd

## 2021-09-12 NOTE — Assessment & Plan Note (Signed)
Right upper leg as above, ? Neuritic vs other, declines cymbalta trial, declines right hip MRI, for celebrex 100 bid prn,

## 2021-09-16 ENCOUNTER — Telehealth: Payer: Self-pay | Admitting: Internal Medicine

## 2021-09-16 NOTE — Telephone Encounter (Signed)
Okay for orders? 

## 2021-09-16 NOTE — Telephone Encounter (Signed)
Needs verbal order for social work evaluation for 2 weeks.

## 2021-09-17 NOTE — Telephone Encounter (Signed)
Verbals given today to [employer]

## 2021-09-21 ENCOUNTER — Ambulatory Visit: Payer: Medicare Other

## 2021-09-21 DIAGNOSIS — Z7984 Long term (current) use of oral hypoglycemic drugs: Secondary | ICD-10-CM | POA: Diagnosis not present

## 2021-09-21 DIAGNOSIS — M50321 Other cervical disc degeneration at C4-C5 level: Secondary | ICD-10-CM | POA: Diagnosis not present

## 2021-09-21 DIAGNOSIS — E1140 Type 2 diabetes mellitus with diabetic neuropathy, unspecified: Secondary | ICD-10-CM | POA: Diagnosis not present

## 2021-09-21 DIAGNOSIS — N390 Urinary tract infection, site not specified: Secondary | ICD-10-CM | POA: Diagnosis not present

## 2021-09-21 DIAGNOSIS — E871 Hypo-osmolality and hyponatremia: Secondary | ICD-10-CM | POA: Diagnosis not present

## 2021-09-21 DIAGNOSIS — I10 Essential (primary) hypertension: Secondary | ICD-10-CM | POA: Diagnosis not present

## 2021-09-29 ENCOUNTER — Ambulatory Visit: Payer: Medicare Other

## 2021-09-29 ENCOUNTER — Encounter: Payer: Self-pay | Admitting: Internal Medicine

## 2021-09-29 NOTE — Progress Notes (Signed)
Subjective:    Patient ID: Tami Jacobson, female    DOB: Apr 09, 1927, 88 y.o.   MRN: [id number]     HPI Tami Jacobson is here for follow up of her chronic medical problems, including   Saw Dr Jenny Reichmann 2 weeks ago for right upper leg pain - ? Neuritic vs other - declined cymbalta trial, declined MRI.  Rx'd celebrex 100 mg bid prn   When she gets up and puts weight on her right leg she has pain in her posterior right hip, hip, groin and go down the leg to the knee.  Then it hurts the rest of the day.  No N/T in the leg.  No weakness in the leg.  Celebrex has not helped.   No pain with sleeping.  She is using a walker to ambulate.  She is very fearful of falling.  Medications and allergies reviewed with patient and updated if appropriate.  Current Outpatient Medications on File Prior to Visit  Medication Sig Dispense Refill   Accu-Chek Softclix Lancets lancets Use as instructed 100 each 4   acetaminophen (TYLENOL) 500 MG tablet Take 500 mg by mouth every 6 (six) hours as needed.     blood glucose meter kit and supplies KIT Dispense based on patient and insurance preference. Check sugars once daily and as needed as directed. 1 each 0   celecoxib (CELEBREX) 100 MG capsule Take 1 capsule (100 mg total) by mouth 2 (two) times daily as needed. 60 capsule 2   cyanocobalamin (,VITAMIN B-12,) 1000 MCG/ML injection Inject 1,000 mcg into the muscle every 30 (thirty) days.     flecainide (TAMBOCOR) 50 MG tablet TAKE 1 TABLET BY MOUTH  TWICE DAILY 180 tablet 3   glucose blood (ACCU-CHEK GUIDE) test strip USE AS DIRECTED 100 strip 4   losartan (COZAAR) 50 MG tablet Take 1 tablet (50 mg total) by mouth daily. 90 tablet 1   metFORMIN (GLUCOPHAGE-XR) 500 MG 24 hr tablet TAKE 1 TABLET BY MOUTH ONCE DAILY WITH BREAKFAST 90 tablet 3   metoprolol succinate (TOPROL-XL) 50 MG 24 hr tablet TAKE 1 AND 1/2 TABLETS BY  MOUTH DAILY . TAKE WITH OR  IMMEDIATELY FOLLOWING A  MEAL 135 tablet 3   omeprazole  (PRILOSEC) 40 MG capsule TAKE 1 CAPSULE BY MOUTH  DAILY 90 capsule 3   tamoxifen (NOLVADEX) 20 MG tablet TAKE 1 TABLET BY MOUTH  DAILY 90 tablet 3   No current facility-administered medications on file prior to visit.     Review of Systems     Objective:   Vitals:   09/30/21 1322  BP: 132/70  Pulse: 63  Temp: 98.1 F (36.7 C)  SpO2: 96%   BP Readings from Last 3 Encounters:  09/30/21 132/70  09/10/21 (!) 164/70  08/26/21 140/60   Wt Readings from Last 3 Encounters:  09/30/21 121 lb (54.9 kg)  09/10/21 120 lb 12.8 oz (54.8 kg)  08/26/21 119 lb (54 kg)   Body mass index is 21.43 kg/m.    Physical Exam Constitutional:      Appearance: Normal appearance.  HENT:     Head: Normocephalic and atraumatic.  Musculoskeletal:        General: Tenderness (positive tenderness right lateral hip and right groin, right lower back ( not new), no lumbar spine pain) present.     Right lower leg: Edema (mild) present.     Left lower leg: Edema (mild) present.  Skin:    General: Skin is  warm and dry.  Neurological:     Mental Status: She is alert.     Sensory: No sensory deficit.        Lab Results  Component Value Date   WBC 6.2 03/11/2021   HGB 10.2 (L) 03/11/2021   HCT 29.9 (L) 03/11/2021   PLT 184.0 03/11/2021   GLUCOSE 226 (H) 03/11/2021   CHOL 173 02/22/2019   TRIG 157.0 (H) 02/22/2019   HDL 50.40 02/22/2019   LDLDIRECT 128.3 12/26/2012   LDLCALC 92 02/22/2019   ALT 11 03/11/2021   AST 13 03/11/2021   NA 135 03/11/2021   K 4.7 03/11/2021   CL 100 03/11/2021   CREATININE 0.79 03/11/2021   BUN 13 03/11/2021   CO2 30 03/11/2021   TSH 2.519 05/15/2019   HGBA1C 7.0 (H) 03/11/2021   MICROALBUR 1.0 08/23/2018     Assessment & Plan:    See Problem List for Assessment and Plan of chronic medical problems.

## 2021-09-30 ENCOUNTER — Telehealth: Payer: Self-pay | Admitting: Internal Medicine

## 2021-09-30 ENCOUNTER — Ambulatory Visit (INDEPENDENT_AMBULATORY_CARE_PROVIDER_SITE_OTHER): Payer: Medicare Other | Admitting: Internal Medicine

## 2021-09-30 ENCOUNTER — Ambulatory Visit (INDEPENDENT_AMBULATORY_CARE_PROVIDER_SITE_OTHER): Payer: Medicare Other

## 2021-09-30 VITALS — BP 132/70 | HR 63 | Temp 98.1°F | Ht 63.0 in | Wt 121.0 lb

## 2021-09-30 DIAGNOSIS — I10 Essential (primary) hypertension: Secondary | ICD-10-CM | POA: Diagnosis not present

## 2021-09-30 DIAGNOSIS — M25551 Pain in right hip: Secondary | ICD-10-CM | POA: Diagnosis not present

## 2021-09-30 DIAGNOSIS — M79604 Pain in right leg: Secondary | ICD-10-CM

## 2021-09-30 DIAGNOSIS — M48061 Spinal stenosis, lumbar region without neurogenic claudication: Secondary | ICD-10-CM | POA: Diagnosis not present

## 2021-09-30 DIAGNOSIS — M5136 Other intervertebral disc degeneration, lumbar region: Secondary | ICD-10-CM | POA: Diagnosis not present

## 2021-09-30 NOTE — Assessment & Plan Note (Signed)
Acute Seen 2 weeks ago and was prescribed Celebrex which has not helped At that time declined imaging, Cymbalta Still having pain in the right hip, right lower back, groin that goes down to the knee.  Denies numbness or tingling.  Pain occurs after bearing weight on the leg.  No pain with sleeping Will obtain lumbar spine x-ray and hip/pelvic x-ray to evaluate for arthritis She was wondering about trying acetaminophen-advised OTC acetaminophen up to 3000 mg a day Advised that if Celebrex is not helping to stop it Discussed that we can consider tramadol if pain is not controlled, but there are side effects Referral to orthopedics Discussed fall precaution

## 2021-09-30 NOTE — Assessment & Plan Note (Signed)
Chronic Blood pressure well controlled Continue losartan 50 mg daily, metoprolol XL 75 mg daily

## 2021-09-30 NOTE — Patient Instructions (Addendum)
     Xray were ordered.   Have these done downstairs.     Medications changes include :   start taking acetaminophen for your right hip pain     A referral was ordered for [facility].     Someone from that office will call you to schedule an appointment.     Return for July nurse visit for B12 injection.

## 2021-09-30 NOTE — Telephone Encounter (Signed)
The x-ray of your hip shows no arthritis or abnormality.  The x-ray of your lower spine shows mild arthritis.  Take the Tylenol.  You definitely need to see orthopedics-they will call you because I put the referral in , but you can also try calling them to schedule an appointment sooner.

## 2021-10-01 NOTE — Telephone Encounter (Signed)
Spoke with patient and informed her of results, she expresses understanding. Patient asked if she should stop taking the celebrex? Also, she let me know that she would rather see ortho on [address] instead of going to [place].

## 2021-10-02 NOTE — Telephone Encounter (Signed)
She should ideally just take the tylenol - not the celebrex.    She can let [facility] know she would prefer the [place] location.

## 2021-10-07 ENCOUNTER — Telehealth: Payer: Medicare Other

## 2021-10-14 ENCOUNTER — Ambulatory Visit (INDEPENDENT_AMBULATORY_CARE_PROVIDER_SITE_OTHER): Payer: Medicare Other

## 2021-10-14 DIAGNOSIS — E538 Deficiency of other specified B group vitamins: Secondary | ICD-10-CM | POA: Diagnosis not present

## 2021-10-14 MED ORDER — CYANOCOBALAMIN 1000 MCG/ML IJ SOLN
1000.0000 ug | Freq: Once | INTRAMUSCULAR | Status: AC
  Administered 2021-10-14: 1000 ug via INTRAMUSCULAR

## 2021-10-14 NOTE — Progress Notes (Signed)
After obtaining consent, and per orders of Dr.Burns, injection of B12 given by Max Sane. Patient tolerated injection well in right deltoid and instructed to report any adverse reaction to me immediately.

## 2021-10-23 NOTE — Telephone Encounter (Signed)
I was able to speak with the pt and she has informed me that she has been taking the Tylenol and has stopped the celebrex. Pt has also stated she has an appointment at the [place] location on Thursday 10/29/2021.

## 2021-10-28 ENCOUNTER — Ambulatory Visit
Admission: RE | Admit: 2021-10-28 | Discharge: 2021-10-28 | Disposition: A | Discharge status: Home / Self Care | Payer: Medicare Other | Source: Ambulatory Visit | Attending: Internal Medicine | Admitting: Internal Medicine

## 2021-10-28 DIAGNOSIS — Z1231 Encounter for screening mammogram for malignant neoplasm of breast: Secondary | ICD-10-CM

## 2021-10-29 DIAGNOSIS — M5451 Vertebrogenic low back pain: Secondary | ICD-10-CM | POA: Diagnosis not present

## 2021-11-04 ENCOUNTER — Ambulatory Visit: Payer: Medicare Other | Admitting: Podiatry

## 2021-11-04 DIAGNOSIS — M79674 Pain in right toe(s): Secondary | ICD-10-CM

## 2021-11-04 DIAGNOSIS — M79675 Pain in left toe(s): Secondary | ICD-10-CM | POA: Diagnosis not present

## 2021-11-04 DIAGNOSIS — B351 Tinea unguium: Secondary | ICD-10-CM | POA: Diagnosis not present

## 2021-11-10 ENCOUNTER — Encounter: Payer: Self-pay | Admitting: Internal Medicine

## 2021-11-10 NOTE — Patient Instructions (Addendum)
    B12 injection given today   Blood work was ordered.     Medications changes include :   none   Your prescription(s) have been sent to your pharmacy.      Return in about 4 months (around 03/13/2022) for follow up.

## 2021-11-10 NOTE — Progress Notes (Unsigned)
    Subjective:    Patient ID: Tami Jacobson, female    DOB: 09/26/1927, 88 y.o.   MRN: [id number]     HPI Tami Jacobson is here for follow up of her chronic medical problems, including htn, dm, gerd, B12 def.  Following with Dr Bean for ruptured lumbar disc.     3 months ago we decreased her losartan.      Medications and allergies reviewed with patient and updated if appropriate.  Current Outpatient Medications on File Prior to Visit  Medication Sig Dispense Refill   Accu-Chek Softclix Lancets lancets Use as instructed 100 each 4   acetaminophen (TYLENOL 8 HOUR) 650 MG CR tablet Take 2 tablets every 8 hours by oral route.     acetaminophen (TYLENOL) 500 MG tablet Take 500 mg by mouth every 6 (six) hours as needed.     blood glucose meter kit and supplies KIT Dispense based on patient and insurance preference. Check sugars once daily and as needed as directed. 1 each 0   celecoxib (CELEBREX) 100 MG capsule Take 1 capsule (100 mg total) by mouth 2 (two) times daily as needed. 60 capsule 2   clobetasol cream (TEMOVATE) 0.05 % APPLY CREAM TOPICALLY TWICE DAILY AS NEEDED FOR IRRITATION FROM LICHEN SCLEROSUS     cyanocobalamin (,VITAMIN B-12,) 1000 MCG/ML injection Inject 1,000 mcg into the muscle every 30 (thirty) days.     doxycycline (VIBRA-TABS) 100 MG tablet Take 1 tablet by mouth 2 (two) times daily.     flecainide (TAMBOCOR) 50 MG tablet TAKE 1 TABLET BY MOUTH  TWICE DAILY 180 tablet 3   fluticasone (FLONASE) 50 MCG/ACT nasal spray USE 2 SPRAY(S) IN EACH NOSTRIL ONCE DAILY(AT NIGHT)     furosemide (LASIX) 20 MG tablet      gabapentin (NEURONTIN) 300 MG capsule TAKE 1 CAPSULE BY MOUTH IN THE MORNING THEN 2 AT NIGHT     glucose blood (ACCU-CHEK GUIDE) test strip USE AS DIRECTED 100 strip 4   hydrALAZINE (APRESOLINE) 10 MG tablet      hydrocortisone (ANUCORT-HC) 25 MG suppository      lidocaine (LIDODERM) 5 % Apply by topical route for 30 days.     losartan (COZAAR) 50 MG tablet  Take 1 tablet (50 mg total) by mouth daily. 90 tablet 1   losartan (COZAAR) 50 MG tablet Take 1 tablet by mouth daily.     metFORMIN (GLUCOPHAGE-XR) 500 MG 24 hr tablet TAKE 1 TABLET BY MOUTH ONCE DAILY WITH BREAKFAST 90 tablet 3   metoprolol succinate (TOPROL-XL) 50 MG 24 hr tablet TAKE 1 AND 1/2 TABLETS BY  MOUTH DAILY . TAKE WITH OR  IMMEDIATELY FOLLOWING A  MEAL 135 tablet 3   omeprazole (PRILOSEC) 40 MG capsule TAKE 1 CAPSULE BY MOUTH  DAILY 90 capsule 3   prednisoLONE acetate (PRED FORTE) 1 % ophthalmic suspension INSTILL 1 DROP INTO EACH EYE THREE TIMES DAILY FOR 1 WEEK THEN 1 DROP TWICE DAILY FOR SECOND & THIRD WEEK     ranitidine (ZANTAC) 150 MG tablet      tamoxifen (NOLVADEX) 20 MG tablet TAKE 1 TABLET BY MOUTH  DAILY 90 tablet 3   terconazole (TERAZOL 7) 0.4 % vaginal cream INSERT 1 APPLICATORFUL VAGINALLY AT BEDTIME FOR 7 DAYS, ALSO APPLY TO THE VULVA EVEYNIGHT FOR 7 NIGHTS.     traMADol (ULTRAM) 50 MG tablet Take 1 tablet twice a day by oral route.     No current facility-administered medications on file prior to   visit.     Review of Systems     Objective:  There were no vitals filed for this visit. BP Readings from Last 3 Encounters:  09/30/21 132/70  09/10/21 (!) 164/70  08/26/21 140/60   Wt Readings from Last 3 Encounters:  09/30/21 121 lb (54.9 kg)  09/10/21 120 lb 12.8 oz (54.8 kg)  08/26/21 119 lb (54 kg)   There is no height or weight on file to calculate BMI.    Physical Exam     Lab Results  Component Value Date   WBC 6.2 03/11/2021   HGB 10.2 (L) 03/11/2021   HCT 29.9 (L) 03/11/2021   PLT 184.0 03/11/2021   GLUCOSE 226 (H) 03/11/2021   CHOL 173 02/22/2019   TRIG 157.0 (H) 02/22/2019   HDL 50.40 02/22/2019   LDLDIRECT 128.3 12/26/2012   LDLCALC 92 02/22/2019   ALT 11 03/11/2021   AST 13 03/11/2021   NA 135 03/11/2021   K 4.7 03/11/2021   CL 100 03/11/2021   CREATININE 0.79 03/11/2021   BUN 13 03/11/2021   CO2 30 03/11/2021   TSH 2.519  05/15/2019   HGBA1C 7.0 (H) 03/11/2021   MICROALBUR 1.0 08/23/2018     Assessment & Plan:    See Problem List for Assessment and Plan of chronic medical problems.    

## 2021-11-11 ENCOUNTER — Encounter: Payer: Self-pay | Admitting: Podiatry

## 2021-11-11 ENCOUNTER — Ambulatory Visit (INDEPENDENT_AMBULATORY_CARE_PROVIDER_SITE_OTHER): Payer: Medicare Other | Admitting: Internal Medicine

## 2021-11-11 VITALS — BP 134/64 | HR 69 | Temp 97.9°F | Ht 63.0 in | Wt 120.0 lb

## 2021-11-11 DIAGNOSIS — I10 Essential (primary) hypertension: Secondary | ICD-10-CM | POA: Diagnosis not present

## 2021-11-11 DIAGNOSIS — D649 Anemia, unspecified: Secondary | ICD-10-CM | POA: Diagnosis not present

## 2021-11-11 DIAGNOSIS — M5416 Radiculopathy, lumbar region: Secondary | ICD-10-CM | POA: Diagnosis not present

## 2021-11-11 DIAGNOSIS — E118 Type 2 diabetes mellitus with unspecified complications: Secondary | ICD-10-CM | POA: Diagnosis not present

## 2021-11-11 DIAGNOSIS — E538 Deficiency of other specified B group vitamins: Secondary | ICD-10-CM

## 2021-11-11 DIAGNOSIS — K219 Gastro-esophageal reflux disease without esophagitis: Secondary | ICD-10-CM

## 2021-11-11 DIAGNOSIS — K5904 Chronic idiopathic constipation: Secondary | ICD-10-CM | POA: Diagnosis not present

## 2021-11-11 DIAGNOSIS — Z789 Other specified health status: Secondary | ICD-10-CM | POA: Insufficient documentation

## 2021-11-11 LAB — HEMOGLOBIN A1C: Hgb A1c MFr Bld: 6.7 % — ABNORMAL HIGH (ref 4.6–6.5)

## 2021-11-11 LAB — FERRITIN: Ferritin: 60.6 ng/mL (ref 10.0–291.0)

## 2021-11-11 LAB — COMPREHENSIVE METABOLIC PANEL
ALT: 10 U/L (ref 0–35)
AST: 15 U/L (ref 0–37)
Albumin: 3.8 g/dL (ref 3.5–5.2)
Alkaline Phosphatase: 61 U/L (ref 39–117)
BUN: 13 mg/dL (ref 6–23)
CO2: 31 mEq/L (ref 19–32)
Calcium: 9.3 mg/dL (ref 8.4–10.5)
Chloride: 102 mEq/L (ref 96–112)
Creatinine, Ser: 0.89 mg/dL (ref 0.40–1.20)
GFR: 56.42 mL/min — ABNORMAL LOW (ref >60.00)
Glucose, Bld: 157 mg/dL — ABNORMAL HIGH (ref 70–99)
Potassium: 4.4 mEq/L (ref 3.5–5.1)
Sodium: 138 mEq/L (ref 135–145)
Total Bilirubin: 0.4 mg/dL (ref 0.2–1.2)
Total Protein: 6.3 g/dL (ref 6.0–8.3)

## 2021-11-11 LAB — CBC WITH DIFFERENTIAL/PLATELET
Basophils Absolute: 0 10*3/uL (ref 0.0–0.1)
Basophils Relative: 0.6 % (ref 0.0–3.0)
Eosinophils Absolute: 0.1 10*3/uL (ref 0.0–0.7)
Eosinophils Relative: 1.4 % (ref 0.0–5.0)
HCT: 30.2 % — ABNORMAL LOW (ref 36.0–46.0)
Hemoglobin: 10.2 g/dL — ABNORMAL LOW (ref 12.0–15.0)
Lymphocytes Relative: 23.4 % (ref 12.0–46.0)
Lymphs Abs: 1.2 10*3/uL (ref 0.7–4.0)
MCHC: 33.7 g/dL (ref 30.0–36.0)
MCV: 95 fl (ref 78.0–100.0)
Monocytes Absolute: 0.5 10*3/uL (ref 0.1–1.0)
Monocytes Relative: 9.9 % (ref 3.0–12.0)
Neutro Abs: 3.3 10*3/uL (ref 1.4–7.7)
Neutrophils Relative %: 64.7 % (ref 43.0–77.0)
Platelets: 140 10*3/uL — ABNORMAL LOW (ref 150.0–400.0)
RBC: 3.17 Mil/uL — ABNORMAL LOW (ref 3.87–5.11)
RDW: 12.8 % (ref 11.5–15.5)
WBC: 5.2 10*3/uL (ref 4.0–10.5)

## 2021-11-11 LAB — IBC PANEL
Iron: 65 ug/dL (ref 42–145)
Saturation Ratios: 19.2 % — ABNORMAL LOW (ref 20.0–50.0)
TIBC: 338.8 ug/dL (ref 250.0–450.0)
Transferrin: 242 mg/dL (ref 212.0–360.0)

## 2021-11-11 LAB — FOLATE: Folate: 7.9 ng/mL (ref >5.9)

## 2021-11-11 MED ORDER — FLUTICASONE PROPIONATE 50 MCG/ACT NA SUSP: NASAL | Status: DC

## 2021-11-11 NOTE — Assessment & Plan Note (Signed)
Chronic Intermittent Taking miralax as needed Advised she can take a stool softener a couple of times or week or more if needed for prevention

## 2021-11-11 NOTE — Assessment & Plan Note (Signed)
Chronic Following with Dr Maxie Better Taking tylenol Will have a steroid injection next week

## 2021-11-11 NOTE — Assessment & Plan Note (Signed)
Chronic ? Cause Getting B12 injection monthly - continue Check iron panel, folate

## 2021-11-11 NOTE — Assessment & Plan Note (Addendum)
Chronic Continue B12 injections monthly 

## 2021-11-11 NOTE — Progress Notes (Signed)
  Subjective:  Patient ID: Tami Jacobson, female    DOB: [birth year],  MRN: [id number]  Tami Jacobson presents to clinic today for preventative diabetic foot care and painful elongated mycotic toenails 1-5 bilaterally which are tender when wearing enclosed shoe gear. Pain is relieved with periodic professional debridement.  Last known HgA1c was unknown.  Patient did not check blood glucose today.  New problem(s): None.   PCP is Binnie Rail, MD , and last visit was  September 30, 2021  Allergies  Allergen Reactions   Statins Other (See Comments)    GI UPSET   Effexor [Venlafaxine]     Constipation, shaking, dizzy   Gabapentin     drowsniess   Cephalexin Other (See Comments)    Reaction not recalled by the patient   Codeine Other (See Comments)    Reaction not recalled by the patient   Lansoprazole Other (See Comments)    Reaction not recalled by the patient   Latex Rash        Levofloxacin Other (See Comments)    Reaction not recalled by the patient   Nitrofurantoin Other (See Comments)    Reaction not recalled by the patient   Rofecoxib Other (See Comments)    Reaction not recalled by the patient     Review of Systems: Negative except as noted in the HPI.  Objective: No changes noted in today's physical examination. Objective:   Vascular Examination: Vascular status intact b/l with palpable pedal pulses. Pedal hair present b/l. CFT immediate b/l. No edema. No pain with calf compression b/l. Skin temperature gradient WNL b/l.   Neurological Examination: Sensation grossly intact b/l with 10 gram monofilament. Vibratory sensation intact b/l.   Dermatological Examination: Pedal skin with normal turgor, texture and tone b/l. Toenails 1-5 b/l thick, discolored, elongated with subungual debris and pain on dorsal palpation. No hyperkeratotic lesions noted b/l.   Musculoskeletal Examination: Muscle strength 5/5 to b/l LE. No pain, crepitus or joint limitation  noted with ROM bilateral LE. No gross bony deformities bilaterally. Utilizes walker for ambulation assistance.  Radiographs: None  Last A1c:      Latest Ref Rng & Units 03/11/2021   11:59 AM  Hemoglobin A1C  Hemoglobin-A1c 4.6 - 6.5 % 7.0    Assessment/Plan: 1. Pain due to onychomycosis of toenails of both feet     -Patient was evaluated and treated. All patient's and/or POA's questions/concerns answered on today's visit. -Patient to continue soft, supportive shoe gear daily. -Toenails 1-5 b/l were debrided in length and girth with sterile nail nippers and dremel without iatrogenic bleeding.  -Mycotic toenails 1-5 bilaterally were debrided in length and girth with sterile nail nippers and dremel without incident. -Patient/POA to call should there be question/concern in the interim.   Return in about 3 months (around 02/04/2022).  Marzetta Board, DPM

## 2021-11-11 NOTE — Assessment & Plan Note (Signed)
Chronic  Lab Results  Component Value Date   HGBA1C 7.0 (H) 03/11/2021   Sugars fairly controlled Check A1c Continue metformin xr 500 mg daily Stressed regular exercise, diabetic diet

## 2021-11-11 NOTE — Assessment & Plan Note (Signed)
Chronic Not on any medication Denies GERD

## 2021-11-11 NOTE — Assessment & Plan Note (Addendum)
Chronic BP well controlled Continue losartan '50mg'$  qd, metoprolol xl 75 mg qd cmp

## 2021-11-16 ENCOUNTER — Ambulatory Visit (INDEPENDENT_AMBULATORY_CARE_PROVIDER_SITE_OTHER): Payer: Medicare Other

## 2021-11-16 DIAGNOSIS — E538 Deficiency of other specified B group vitamins: Secondary | ICD-10-CM | POA: Diagnosis not present

## 2021-11-16 MED ORDER — CYANOCOBALAMIN 1000 MCG/ML IJ SOLN
1000.0000 ug | Freq: Once | INTRAMUSCULAR | Status: AC
  Administered 2021-11-16: 1000 ug via INTRAMUSCULAR

## 2021-11-16 NOTE — Progress Notes (Signed)
After obtaining consent, and per orders of Dr. Quay Burow, injection of B12 was given on the left deltoid by Marrian Salvage. Patient tolerated well and instructed to  to report any adverse reaction to me immediately.

## 2021-11-19 DIAGNOSIS — M5416 Radiculopathy, lumbar region: Secondary | ICD-10-CM | POA: Diagnosis not present

## 2021-11-20 DIAGNOSIS — H6123 Impacted cerumen, bilateral: Secondary | ICD-10-CM | POA: Insufficient documentation

## 2021-11-20 DIAGNOSIS — L299 Pruritus, unspecified: Secondary | ICD-10-CM | POA: Diagnosis not present

## 2021-12-02 DIAGNOSIS — H52203 Unspecified astigmatism, bilateral: Secondary | ICD-10-CM | POA: Diagnosis not present

## 2021-12-02 DIAGNOSIS — Z961 Presence of intraocular lens: Secondary | ICD-10-CM | POA: Diagnosis not present

## 2021-12-02 DIAGNOSIS — H5000 Unspecified esotropia: Secondary | ICD-10-CM | POA: Diagnosis not present

## 2021-12-02 DIAGNOSIS — E119 Type 2 diabetes mellitus without complications: Secondary | ICD-10-CM | POA: Diagnosis not present

## 2021-12-03 LAB — HM DIABETES EYE EXAM

## 2021-12-08 DIAGNOSIS — M5451 Vertebrogenic low back pain: Secondary | ICD-10-CM | POA: Diagnosis not present

## 2021-12-09 ENCOUNTER — Encounter: Payer: Self-pay | Admitting: Internal Medicine

## 2021-12-09 NOTE — Progress Notes (Signed)
Outside notes received. Information abstracted. Notes sent to scan.  

## 2021-12-17 ENCOUNTER — Encounter: Payer: Self-pay | Admitting: Internal Medicine

## 2021-12-17 NOTE — Progress Notes (Unsigned)
    Subjective:    Patient ID: Kathelyn Gombos, female    DOB: [birth year], 88 y.o.   MRN: [id number]      HPI Britteny is here for No chief complaint on file.   Due for B12 injection.       Medications and allergies reviewed with patient and updated if appropriate.  Current Outpatient Medications on File Prior to Visit  Medication Sig Dispense Refill   Accu-Chek Softclix Lancets lancets Use as instructed 100 each 4   acetaminophen (TYLENOL 8 HOUR) 650 MG CR tablet Take 2 tablets every 8 hours by oral route.     clobetasol cream (TEMOVATE) 0.05 % APPLY CREAM TOPICALLY TWICE DAILY AS NEEDED FOR IRRITATION FROM LICHEN SCLEROSUS     cyanocobalamin (,VITAMIN B-12,) 1000 MCG/ML injection Inject 1,000 mcg into the muscle every 30 (thirty) days.     flecainide (TAMBOCOR) 50 MG tablet TAKE 1 TABLET BY MOUTH  TWICE DAILY 180 tablet 3   fluticasone (FLONASE) 50 MCG/ACT nasal spray USE 2 SPRAY(S) IN EACH NOSTRIL ONCE DAILY(AT NIGHT)     glucose blood (ACCU-CHEK GUIDE) test strip USE AS DIRECTED 100 strip 4   hydrocortisone (ANUCORT-HC) 25 MG suppository      losartan (COZAAR) 50 MG tablet Take 1 tablet (50 mg total) by mouth daily. 90 tablet 1   metFORMIN (GLUCOPHAGE-XR) 500 MG 24 hr tablet TAKE 1 TABLET BY MOUTH ONCE DAILY WITH BREAKFAST 90 tablet 3   metoprolol succinate (TOPROL-XL) 50 MG 24 hr tablet TAKE 1 AND 1/2 TABLETS BY  MOUTH DAILY . TAKE WITH OR  IMMEDIATELY FOLLOWING A  MEAL 135 tablet 3   prednisoLONE acetate (PRED FORTE) 1 % ophthalmic suspension INSTILL 1 DROP INTO EACH EYE THREE TIMES DAILY FOR 1 WEEK THEN 1 DROP TWICE DAILY FOR SECOND & THIRD WEEK     No current facility-administered medications on file prior to visit.    Review of Systems     Objective:  There were no vitals filed for this visit. BP Readings from Last 3 Encounters:  11/11/21 134/64  09/30/21 132/70  09/10/21 (!) 164/70   Wt Readings from Last 3 Encounters:  11/11/21 120 lb (54.4 kg)   09/30/21 121 lb (54.9 kg)  09/10/21 120 lb 12.8 oz (54.8 kg)   There is no height or weight on file to calculate BMI.    Physical Exam         Assessment & Plan:    See Problem List for Assessment and Plan of chronic medical problems.

## 2021-12-18 ENCOUNTER — Ambulatory Visit: Payer: Medicare Other

## 2021-12-18 ENCOUNTER — Ambulatory Visit (INDEPENDENT_AMBULATORY_CARE_PROVIDER_SITE_OTHER): Payer: Medicare Other | Admitting: Internal Medicine

## 2021-12-18 VITALS — BP 132/68 | HR 74 | Temp 97.9°F | Ht 63.0 in | Wt 118.0 lb

## 2021-12-18 DIAGNOSIS — E538 Deficiency of other specified B group vitamins: Secondary | ICD-10-CM | POA: Diagnosis not present

## 2021-12-18 DIAGNOSIS — I10 Essential (primary) hypertension: Secondary | ICD-10-CM

## 2021-12-18 DIAGNOSIS — Z23 Encounter for immunization: Secondary | ICD-10-CM | POA: Diagnosis not present

## 2021-12-18 DIAGNOSIS — H9190 Unspecified hearing loss, unspecified ear: Secondary | ICD-10-CM

## 2021-12-18 MED ORDER — CYANOCOBALAMIN 1000 MCG/ML IJ SOLN
1000.0000 ug | Freq: Once | INTRAMUSCULAR | Status: AC
  Administered 2021-12-18: 1000 ug via INTRAMUSCULAR

## 2021-12-18 MED ORDER — LOSARTAN POTASSIUM 25 MG PO TABS: 25.0000 mg | ORAL_TABLET | Freq: Every day | ORAL | 2 refills | Status: DC

## 2021-12-18 NOTE — Patient Instructions (Addendum)
    B12 injection and flu vaccine today.    Medications changes include :   Decrease losartan to 25 mg daily   Your prescription(s) have been sent to your pharmacy.    A referral was ordered for [place].     Someone from that office will call you to schedule an appointment.    Return for follow up as scheduled in December.   [place] ([place] [facility] Provider! [facility] [place] [address] [phone])  Serving [place], [place], [place] and the [place] [facility] [employer] and [facility] [place] can assist elderly people located in [place], [place], [place] and the [place] and surrounding areas in [place], [place].

## 2021-12-18 NOTE — Assessment & Plan Note (Signed)
Chronic Has been having some lightheadedness/dizziness - has some lightheadedness when standing Increase fluids Decrease losartan to 25 mg daily Continue metoprolol xl 75 mg daily

## 2021-12-18 NOTE — Assessment & Plan Note (Signed)
Chronic Poor oral absorption Getting B12 injections monthly B12 injection today

## 2021-12-18 NOTE — Assessment & Plan Note (Signed)
Has decreased hearing Would like evaluation Referral to audiology

## 2021-12-22 ENCOUNTER — Encounter: Payer: Self-pay | Admitting: Internal Medicine

## 2021-12-22 NOTE — Progress Notes (Unsigned)
    Subjective:    Patient ID: Tami Jacobson, female    DOB: September 29, 1927, 88 y.o.   MRN: [id number]      HPI Tami Jacobson is here for No chief complaint on file.    Generalized weakness-    Medications and allergies reviewed with patient and updated if appropriate.  Current Outpatient Medications on File Prior to Visit  Medication Sig Dispense Refill   Accu-Chek Softclix Lancets lancets Use as instructed 100 each 4   acetaminophen (TYLENOL 8 HOUR) 650 MG CR tablet Take 2 tablets every 8 hours by oral route.     clobetasol cream (TEMOVATE) 0.05 % APPLY CREAM TOPICALLY TWICE DAILY AS NEEDED FOR IRRITATION FROM LICHEN SCLEROSUS     cyanocobalamin (,VITAMIN B-12,) 1000 MCG/ML injection Inject 1,000 mcg into the muscle every 30 (thirty) days.     flecainide (TAMBOCOR) 50 MG tablet TAKE 1 TABLET BY MOUTH  TWICE DAILY 180 tablet 3   fluticasone (FLONASE) 50 MCG/ACT nasal spray USE 2 SPRAY(S) IN EACH NOSTRIL ONCE DAILY(AT NIGHT)     glucose blood (ACCU-CHEK GUIDE) test strip USE AS DIRECTED 100 strip 4   hydrocortisone (ANUCORT-HC) 25 MG suppository      losartan (COZAAR) 25 MG tablet Take 1 tablet (25 mg total) by mouth daily. 90 tablet 2   metFORMIN (GLUCOPHAGE-XR) 500 MG 24 hr tablet TAKE 1 TABLET BY MOUTH ONCE DAILY WITH BREAKFAST 90 tablet 3   metoprolol succinate (TOPROL-XL) 50 MG 24 hr tablet TAKE 1 AND 1/2 TABLETS BY  MOUTH DAILY . TAKE WITH OR  IMMEDIATELY FOLLOWING A  MEAL 135 tablet 3   prednisoLONE acetate (PRED FORTE) 1 % ophthalmic suspension INSTILL 1 DROP INTO EACH EYE THREE TIMES DAILY FOR 1 WEEK THEN 1 DROP TWICE DAILY FOR SECOND & THIRD WEEK     No current facility-administered medications on file prior to visit.    Review of Systems     Objective:  There were no vitals filed for this visit. BP Readings from Last 3 Encounters:  12/18/21 132/68  11/11/21 134/64  09/30/21 132/70   Wt Readings from Last 3 Encounters:  12/18/21 118 lb (53.5 kg)  11/11/21 120 lb  (54.4 kg)  09/30/21 121 lb (54.9 kg)   There is no height or weight on file to calculate BMI.    Physical Exam         Assessment & Plan:    See Problem List for Assessment and Plan of chronic medical problems.

## 2021-12-23 ENCOUNTER — Telehealth (INDEPENDENT_AMBULATORY_CARE_PROVIDER_SITE_OTHER): Payer: Medicare Other | Admitting: Internal Medicine

## 2021-12-23 ENCOUNTER — Other Ambulatory Visit: Payer: Self-pay

## 2021-12-23 ENCOUNTER — Telehealth: Payer: Self-pay | Admitting: *Deleted

## 2021-12-23 DIAGNOSIS — I10 Essential (primary) hypertension: Secondary | ICD-10-CM

## 2021-12-23 DIAGNOSIS — I5032 Chronic diastolic (congestive) heart failure: Secondary | ICD-10-CM | POA: Diagnosis not present

## 2021-12-23 DIAGNOSIS — I509 Heart failure, unspecified: Secondary | ICD-10-CM

## 2021-12-23 DIAGNOSIS — R531 Weakness: Secondary | ICD-10-CM | POA: Diagnosis not present

## 2021-12-23 DIAGNOSIS — E118 Type 2 diabetes mellitus with unspecified complications: Secondary | ICD-10-CM | POA: Diagnosis not present

## 2021-12-23 MED ORDER — LOSARTAN POTASSIUM 25 MG PO TABS: 25.0000 mg | ORAL_TABLET | Freq: Every day | ORAL | 2 refills | Status: DC

## 2021-12-23 NOTE — Assessment & Plan Note (Signed)
Chronic Euvolemic at her last visit She denies any shortness of breath or leg swelling so I do not think heart failure is an issue or the cause of her generalized weakness Continue metoprolol XL 25 mg daily

## 2021-12-23 NOTE — Assessment & Plan Note (Signed)
Chronic Sugars have been controlled for her age Continue metformin XR 500 mg daily We will recheck A1c at her next visit

## 2021-12-23 NOTE — Telephone Encounter (Signed)
   Telephone encounter was:  Unsuccessful.  12/23/2021 Name: Tami Jacobson MRN: [id number] DOB: Jul 13, 1927  Unsuccessful outbound call made today to assist with:  Transportation Needs  and [place] Patient needing resources for food and transportation  Outreach Attempt:  1st Attempt  A HIPAA compliant voice message was left requesting a return call.  Instructed patient to call back at [phone]. [name] [phone] [address] , [place] [address] Email : Ashby [email or web address]

## 2021-12-23 NOTE — Progress Notes (Signed)
Virtual Visit via Video Note  I connected with Tami Jacobson on 12/23/21 at  8:30 AM EDT by telephone and verified that I am speaking with the correct person using two identifiers.   I discussed the limitations of evaluation and management by telemedicine and the availability of in person appointments. The patient expressed understanding and agreed to proceed.  Present for the visit:  Myself, Dr Billey Gosling, [facility].  The patient is currently at home and I am in the office.    No referring provider.    History of Present Illness: She is here for an acute visit.  She states weakness for a couple of weeks.  She needs a new prescription for her losartan.  We did decrease the dose at her last visit, but she does not have the correct dose at home.  She would ideally like help with transportation to appointments.  She wonders if she should move into an assisted living facility.  It is something she is thinking about.  Has been feeling weak and shaky when she walks.  She is using a walker to ambulate.  It is whole body weakness.    She is not drinking a lot of fluids.  She is not eating much because of her teeth - eats a lot of dumplings.      Review of Systems  Constitutional:  Negative for chills and fever.  HENT:  Negative for congestion.   Respiratory:  Negative for cough, shortness of breath and wheezing.   Cardiovascular:  Negative for chest pain, palpitations and leg swelling.  Genitourinary:  Negative for dysuria, frequency and hematuria.  Neurological:  Negative for headaches.       Lightheaded      Social History   Socioeconomic History   Marital status: Divorced    Spouse name: Marcello Moores   Number of children: 1   Years of education: Not on file   Highest education level: Not on file  Occupational History   Occupation: retire  Tobacco Use   Smoking status: Never   Smokeless tobacco: Never  Vaping Use   Vaping Use: Never used  Substance and Sexual  Activity   Alcohol use: No    Alcohol/week: 0.0 standard drinks of alcohol   Drug use: No   Sexual activity: Not Currently  Other Topics Concern   Not on file  Social History Narrative   Not on file   Social Determinants of Health   Financial Resource Strain: Low Risk  (06/09/2021)   Overall Financial Resource Strain (CARDIA)    Difficulty of Paying Living Expenses: Not hard at all  Food Insecurity: No Food Insecurity (06/09/2021)   Hunger Vital Sign    Worried About Running Out of Food in the Last Year: Never true    [place] in the Last Year: Never true  Transportation Needs: No Transportation Needs (06/09/2021)   PRAPARE - Hydrologist (Medical): No    Lack of Transportation (Non-Medical): No  Physical Activity: Inactive (06/09/2021)   Exercise Vital Sign    Days of Exercise per Week: 0 days    Minutes of Exercise per Session: 0 min  Stress: No Stress Concern Present (06/09/2021)   [place]    Feeling of Stress : Not at all  Social Connections: Moderately Integrated (06/09/2021)   Social Connection and Isolation Panel [NHANES]    Frequency of Communication with Friends and Family: More  than three times a week    Frequency of Social Gatherings with Friends and Family: More than three times a week    Attends Religious Services: More than 4 times per year    Active Member of Clubs or Organizations: No    Attends Music therapist: More than 4 times per year    Marital Status: Widowed     Observations/Objective:    Assessment and Plan:  See Problem List for Assessment and Plan of chronic medical problems.   Follow Up Instructions:    I discussed the assessment and treatment plan with the patient. The patient was provided an opportunity to ask questions and all were answered. The patient agreed with the plan and demonstrated an understanding of the instructions.    The patient was advised to call back or seek an in-person evaluation if the symptoms worsen or if the condition fails to improve as anticipated.  Time spent on telephone call - 18  minutes  Binnie Rail, MD

## 2021-12-23 NOTE — Assessment & Plan Note (Signed)
Acute States 2 weeks of generalized weakness and lightheadedness We have decreased her blood pressure medication because 1 episode sounded vasovagal in nature related to low blood pressure She will check her blood pressure twice a day and update me after few days with her readings so that we can adjust further if needed She has not been eating and drinking much and stressed that she needs to increase her fluids and eat more so that dehydration and poor nutrition is not contributing to her symptoms If her symptoms are not getting better will need to see her back in the office for blood work and further evaluation I think she would greatly benefit from being in an assisted living facility-I think her nephew has been trying to help her, but would benefit from social work to help with placement, possibly medical transportation if she is not able to get placed someplace and Meals on Wheels and she is not eating well/cooking

## 2021-12-23 NOTE — Assessment & Plan Note (Signed)
Chronic Last week I saw her we were concerned about her blood pressure being on the low side so we decreased her losartan to 25 mg daily-needs a new prescription today so I did send within Continue metoprolol XL 75 mg daily She did not check her blood pressure yesterday and has not checked it yet today-stressed checking it twice a day and calling me after a few days to inform me what her pressure is-we need to make sure some of the weakness and lightheadedness is not related to low blood pressure

## 2021-12-30 ENCOUNTER — Telehealth: Payer: Self-pay | Admitting: *Deleted

## 2021-12-30 NOTE — Telephone Encounter (Signed)
   Telephone encounter was:  Successful.  12/30/2021 Name: Tami Jacobson MRN: [id number] DOB: [birth year]  Tami Jacobson is a 88 y.o. year old female who is a primary care patient of Burns, Claudina Lick, MD . The community resource team was consulted for assistance with Transportation Needs  and [place] guide performed the following interventions: Patient moving to [place] and will reach out with any needs after settling in .  Follow Up Plan:  No further follow up planned at this time. The patient has been provided with needed resources.  [facility] [phone] [address] [place] , [place] [address] Email : Ashby [email or web address]. [email or web address] [email or web address]

## 2022-01-02 IMAGING — MG DIGITAL DIAGNOSTIC BILAT W/ TOMO W/ CAD
8 of 13 series · 8 of 37 positions shown · non-contrast
Comparison: Previous exam(s).

CLINICAL DATA: Patient with history right breast cancer in 9292 and
7694 and left breast cancer in 9776.

EXAM:
DIGITAL DIAGNOSTIC BILATERAL MAMMOGRAM WITH CAD AND TOMO
ULTRASOUND RIGHT BREAST

[R CC]
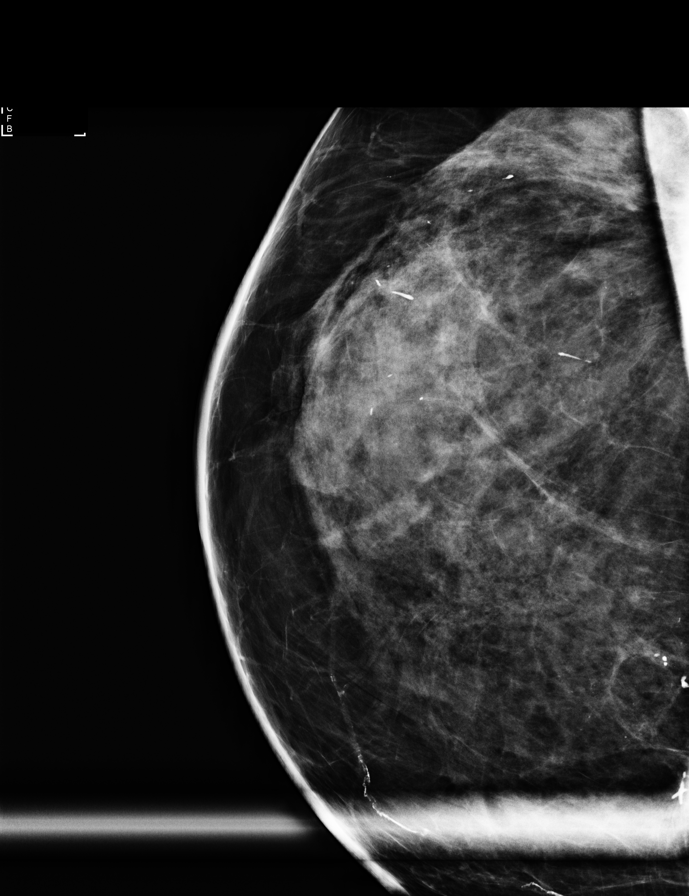

[L MLO synth-2D]
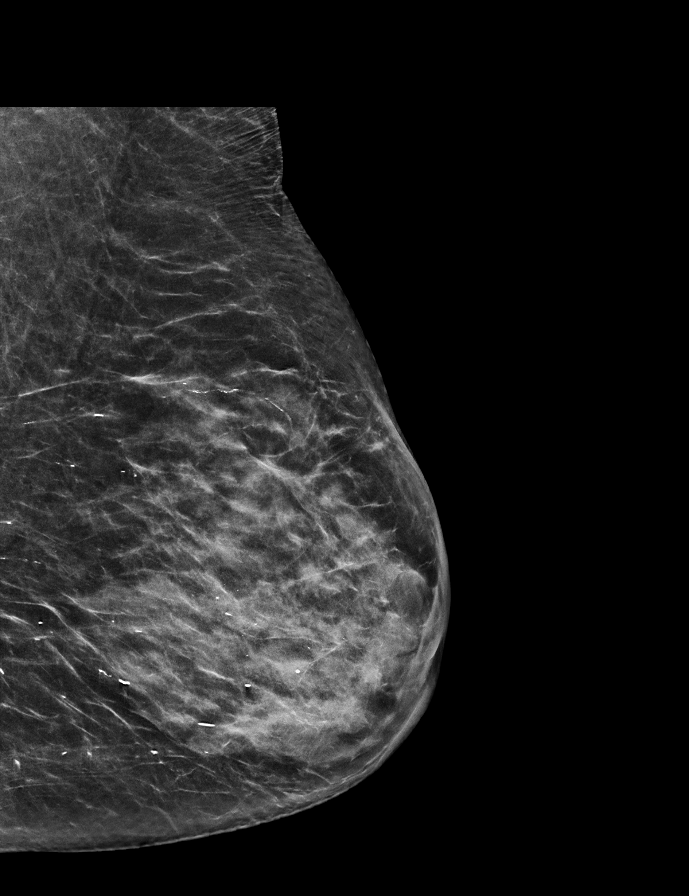

[L CC synth-2D]
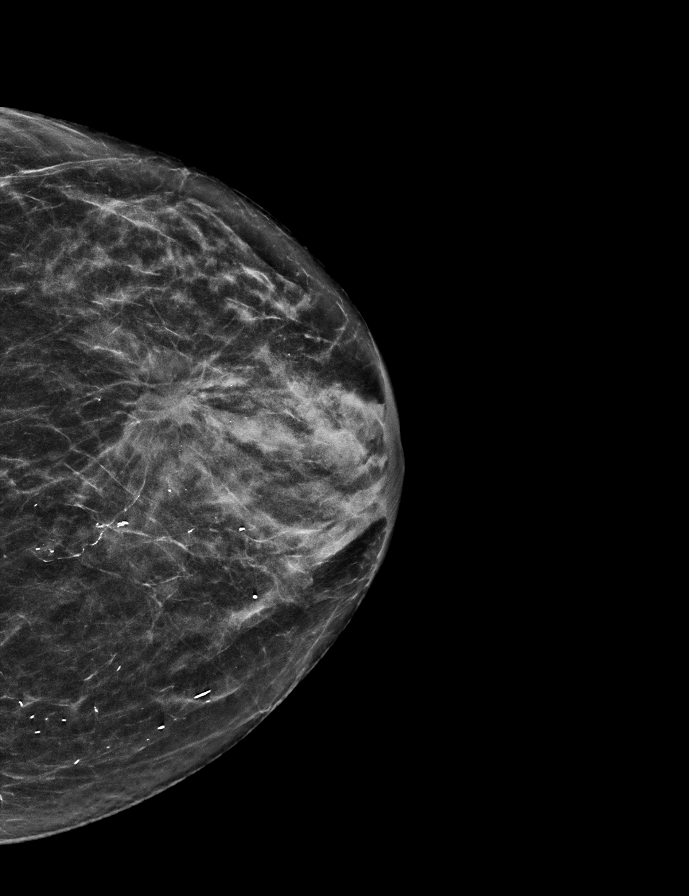

[R ML synth-2D]
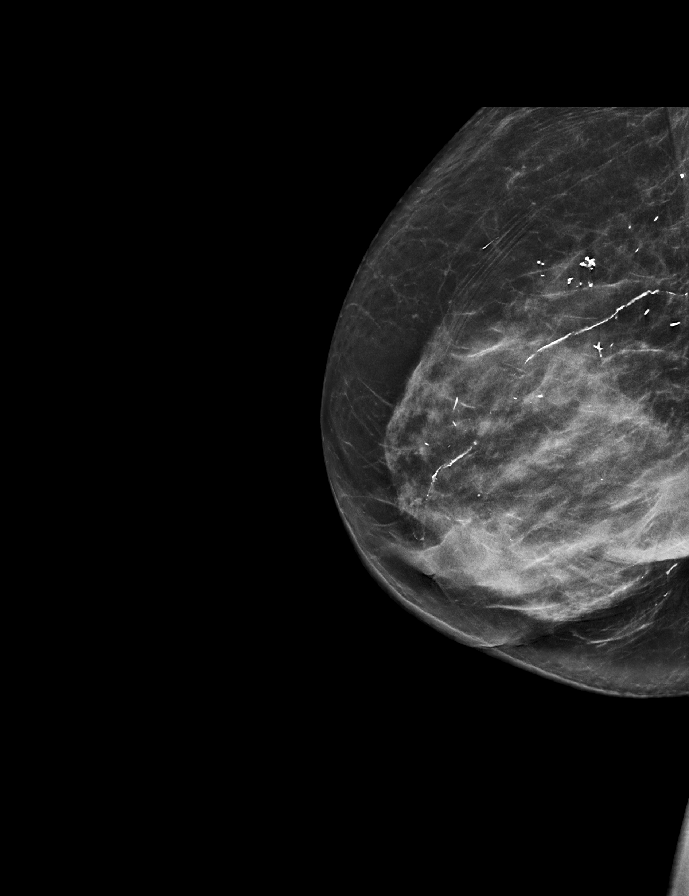

[R CC synth-2D (1 of 2)]
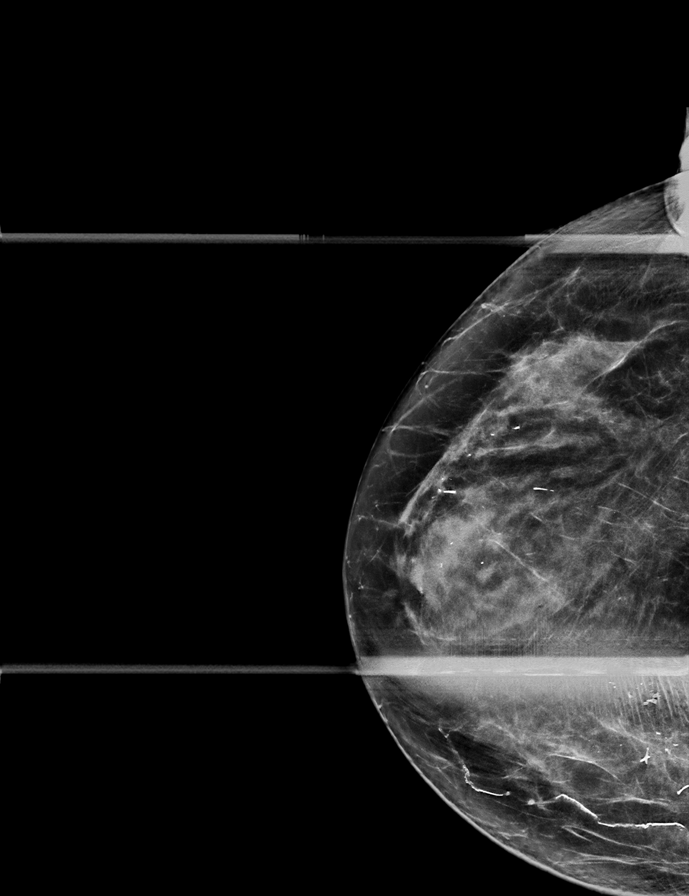

[R MLO synth-2D]
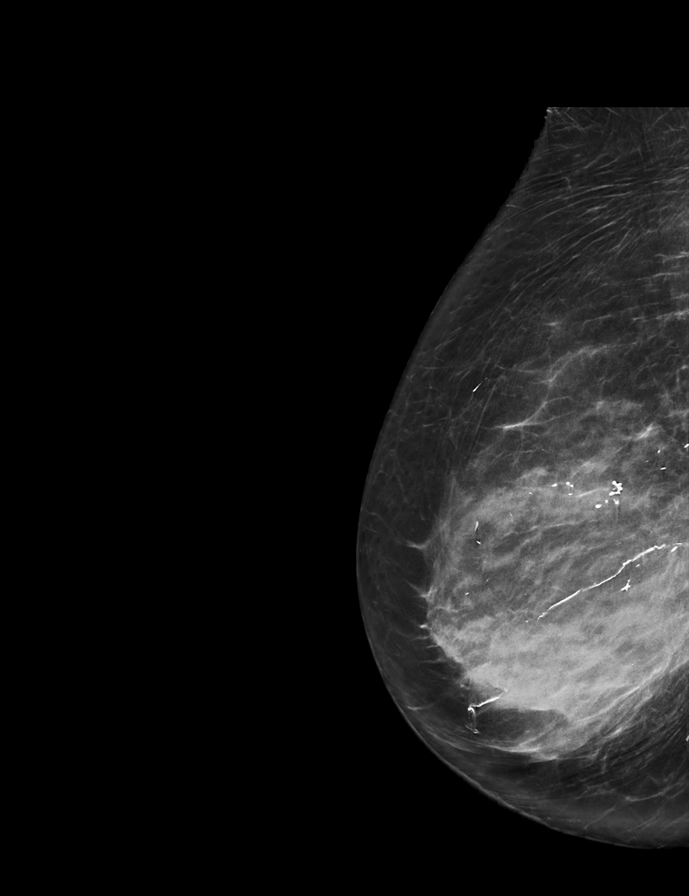

[R CC synth-2D (2 of 2)]
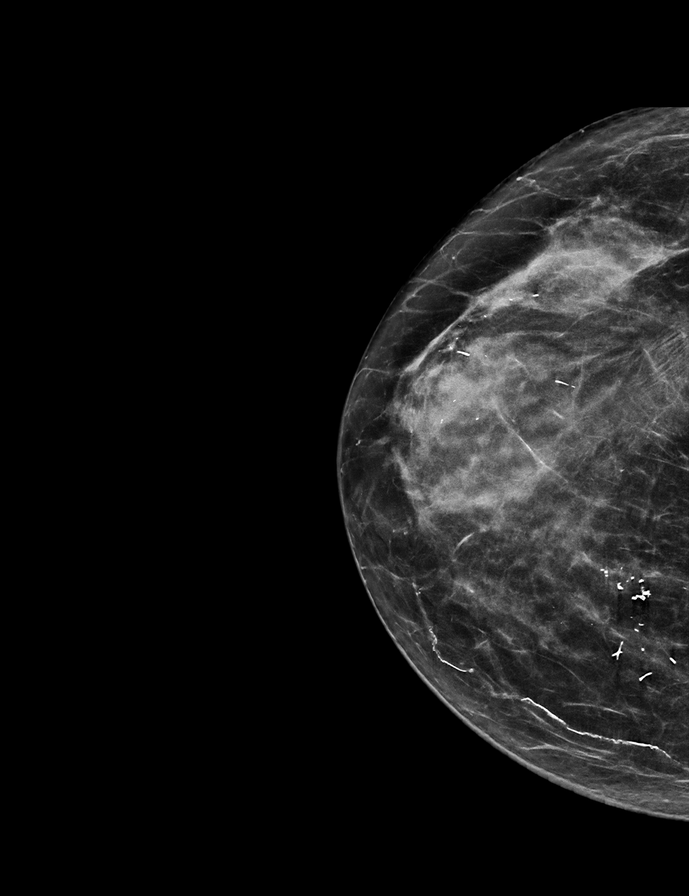

[R MLO tomo · tomo slice 35/69.0]
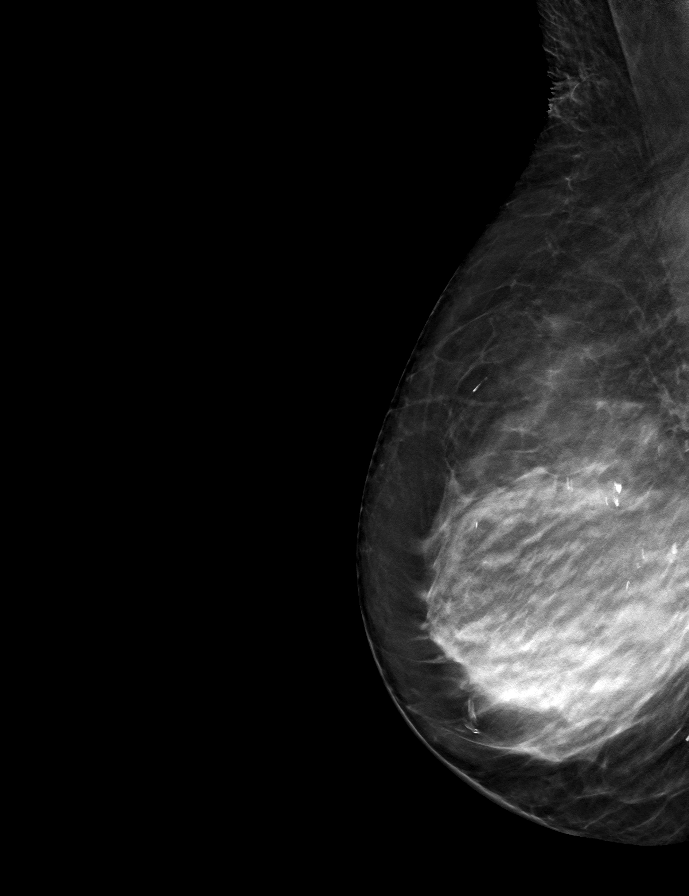

[8 of 37 positions shown; findings below may reference images not displayed]

ACR Breast Density Category c: The breast tissue is heterogeneously
dense, which may obscure small masses.
FINDINGS: When compared to prior exam there is increased distortion and
density within the lower outer right breast, further evaluated with
spot compression cc and true lateral tomosynthesis images. No
additional masses, calcifications or nonsurgical distortion
identified within either breast.

Mammographic images were processed with CAD.

On physical exam, there is a postsurgical scar within the lower
outer right breast.

Targeted ultrasound is performed, showing a 3.7 x 1.0 x 2.0 cm
seroma right breast 8:30 o'clock 4 cm from the nipple.

Immediately adjacent to this is a 1.0 x 0.9 x 0.9 cm lobular
hypoechoic mass right breast 8:30 o'clock 4 cm from nipple.

No right axillary adenopathy.
IMPRESSION: Interval increase in distortion and focal density within the lower
outer right breast posterior depth which may be secondary to
postsurgical changes and radiation. There is a seroma in this
location is well with ultrasound. Additionally, adjacent to the
seroma is a small irregular hypoechoic mass which is indeterminate.

RECOMMENDATION:
Ultrasound-guided core needle biopsy of the indeterminate irregular
hypoechoic mass adjacent to the seroma right breast 8:30 o'clock 4
cm from nipple.

I have discussed the findings and recommendations with the patient.
If applicable, a reminder letter will be sent to the patient
regarding the next appointment.

BI-RADS CATEGORY  4: Suspicious.

## 2022-01-04 ENCOUNTER — Other Ambulatory Visit: Payer: Self-pay | Admitting: *Deleted

## 2022-01-05 ENCOUNTER — Inpatient Hospital Stay: Payer: Medicare Other | Admitting: Hematology and Oncology

## 2022-01-05 ENCOUNTER — Inpatient Hospital Stay: Payer: Medicare Other

## 2022-01-05 IMAGING — MG MM BREAST LOCALIZATION CLIP
4 series · 4 of 12 positions shown · non-contrast
Comparison: Previous exam(s).

CLINICAL DATA: Ultrasound-guided core needle biopsy was performed
of a hypoechoic area adjacent to a postoperative seroma in the [DATE]
position of the right breast 4 cm from the nipple

EXAM:
DIAGNOSTIC RIGHT MAMMOGRAM POST ULTRASOUND BIOPSY

[R ML synth-2D]
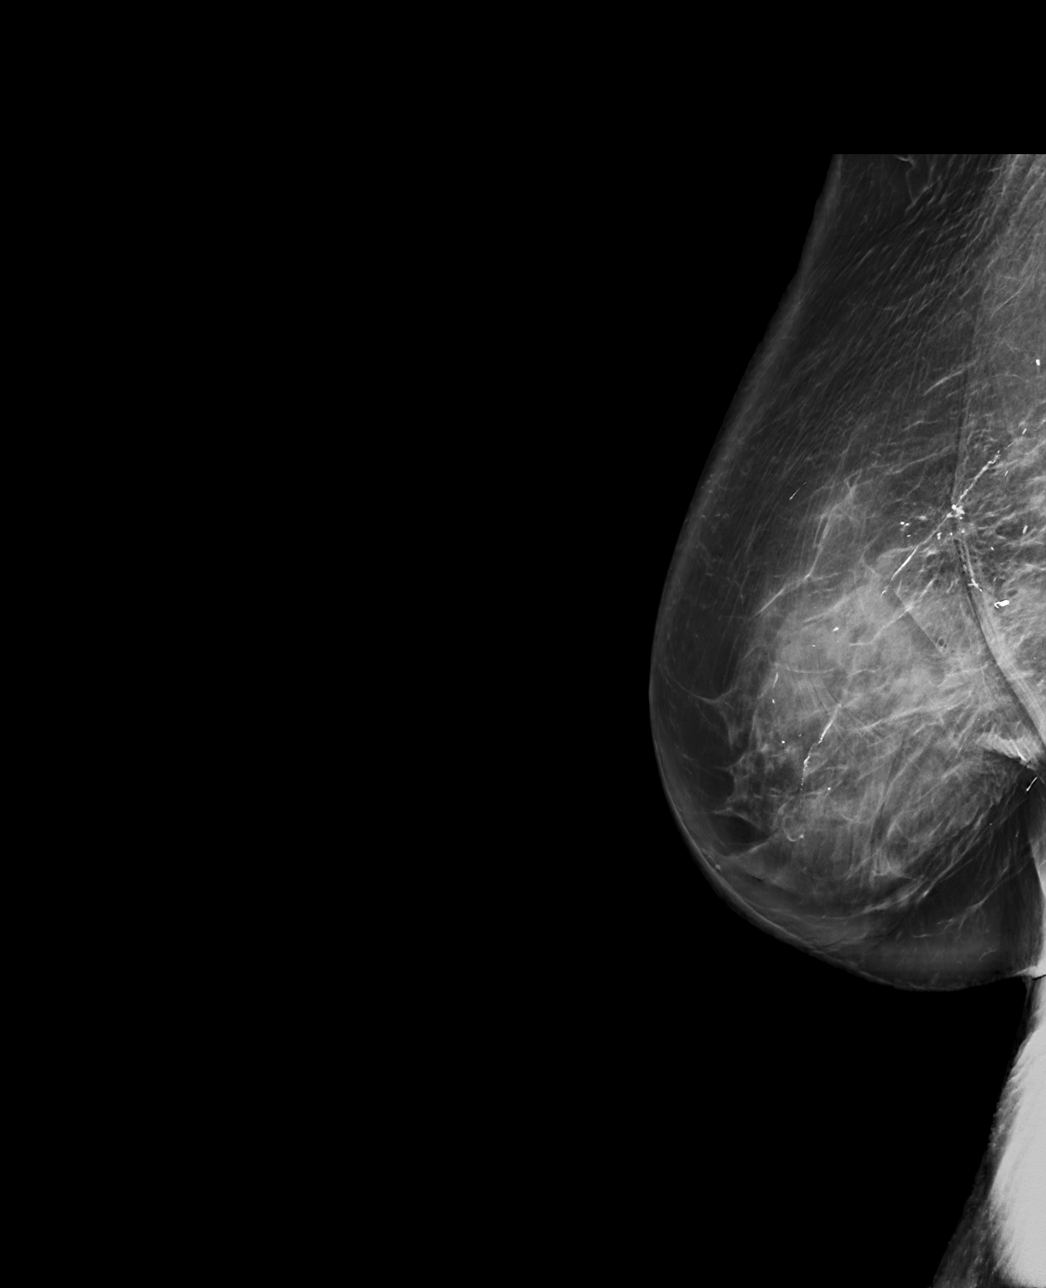

[R CC synth-2D]
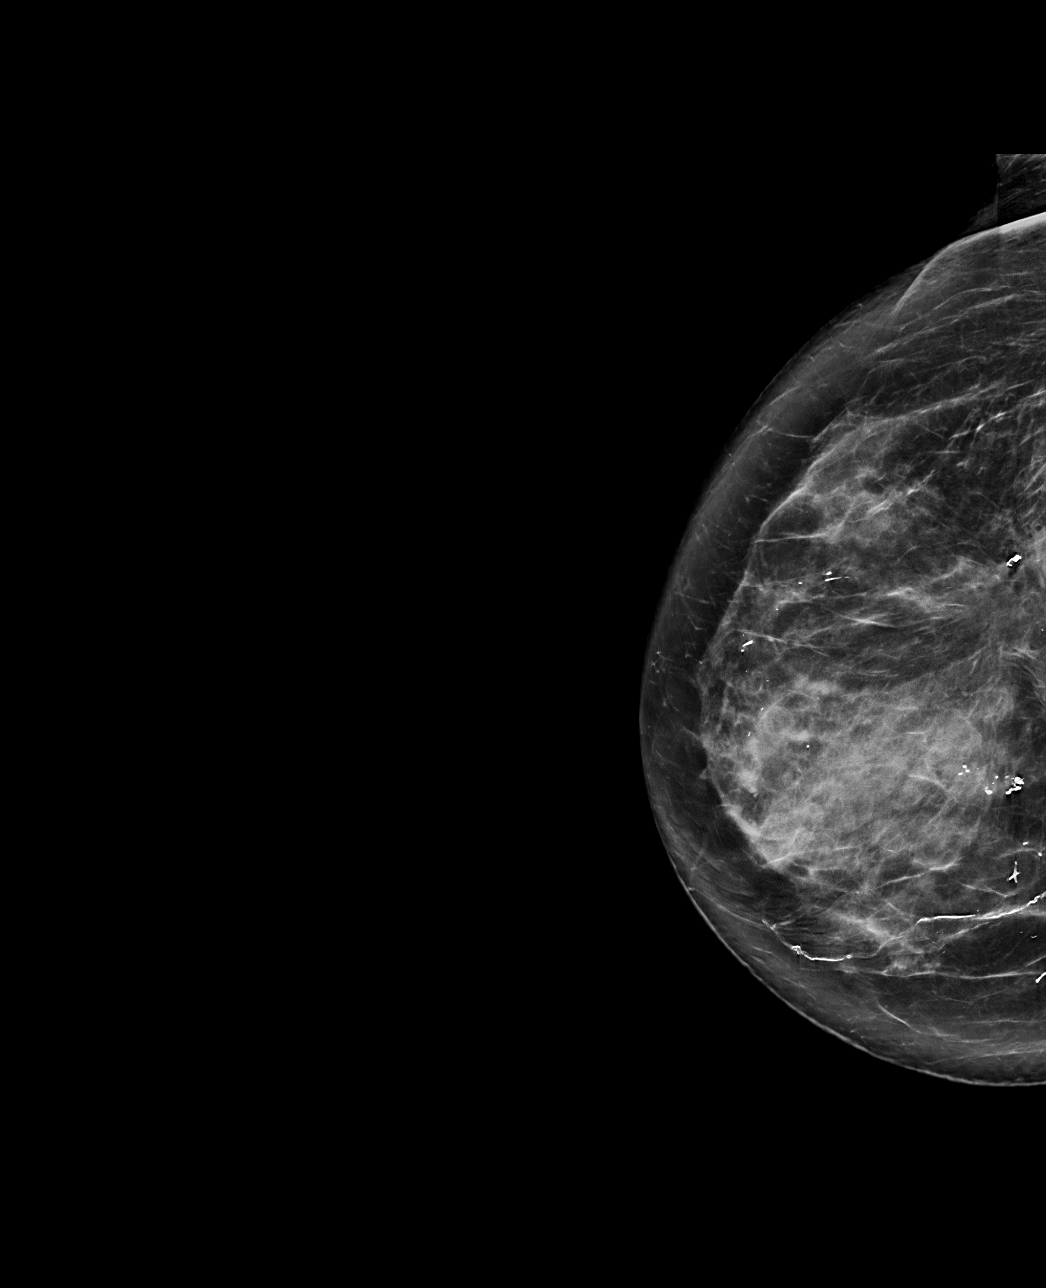

[R ML tomo · tomo slice 50/99.0]
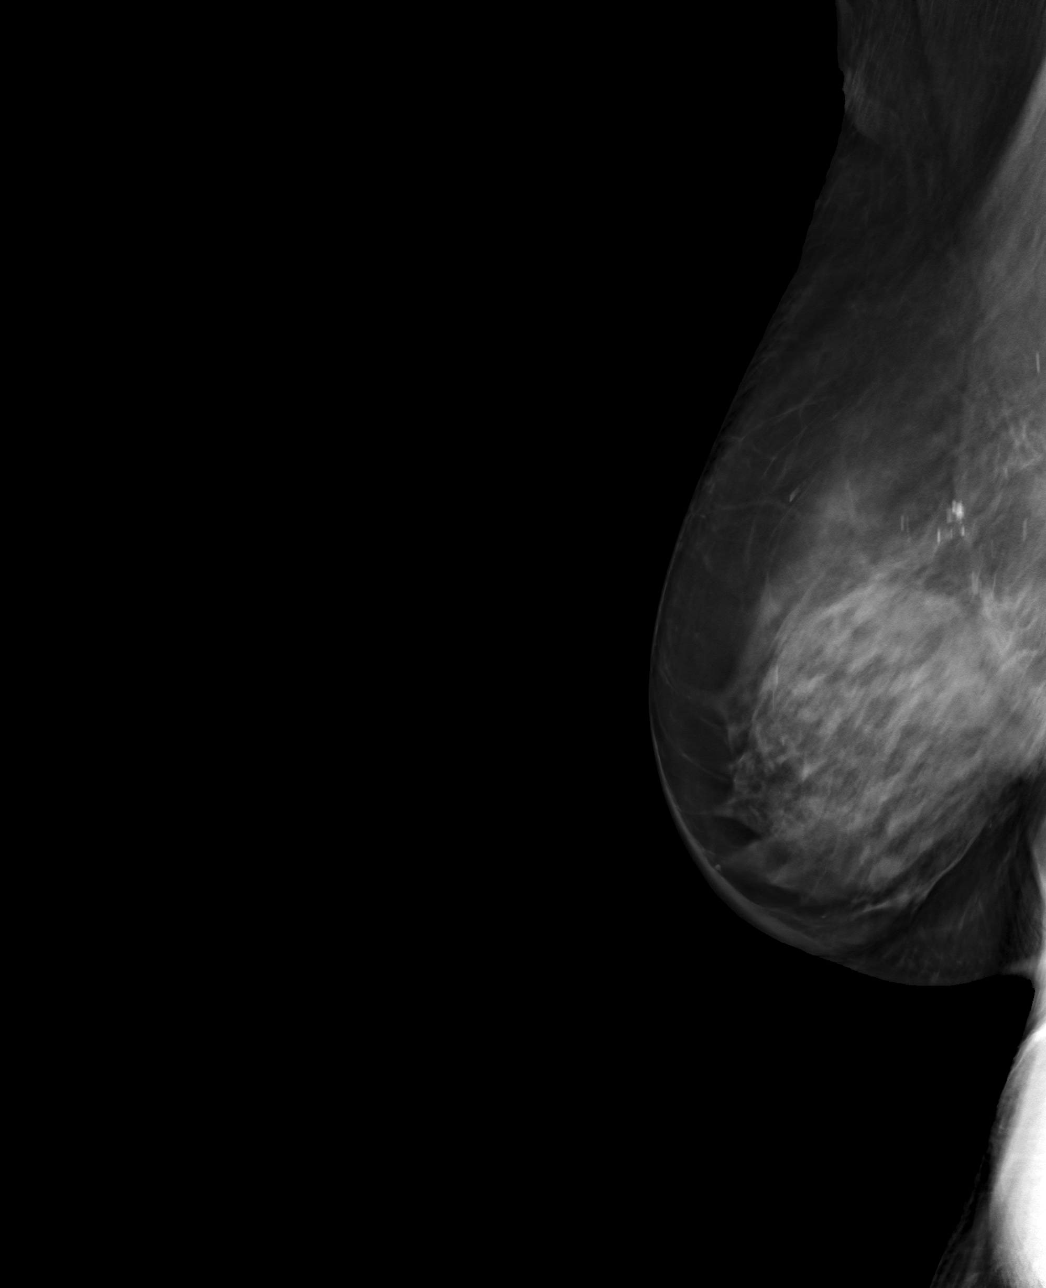

[R CC tomo · tomo slice 39/77.0]
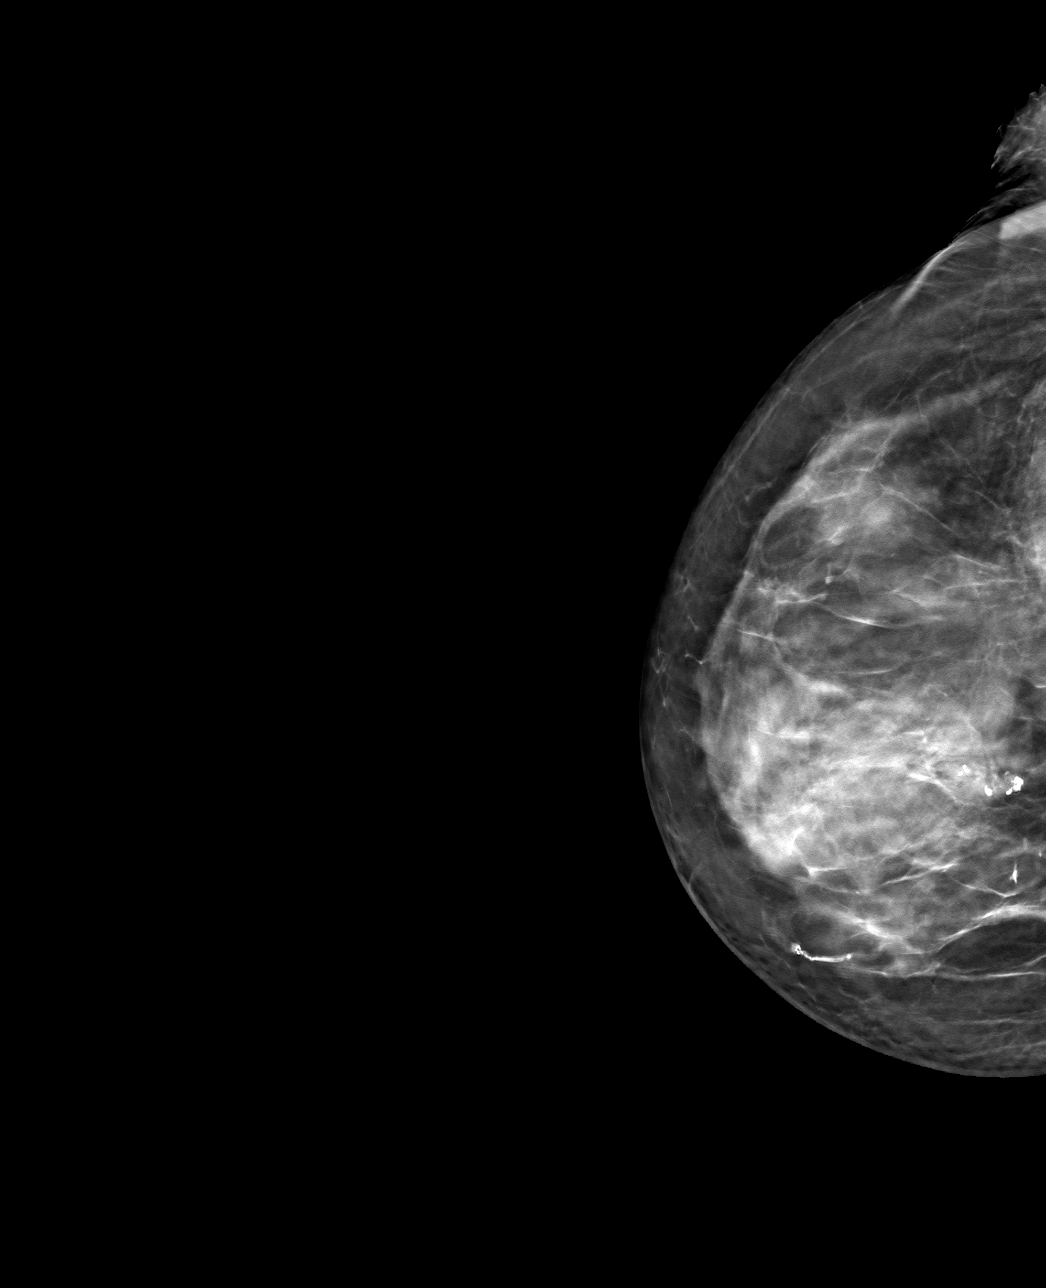

[4 of 12 positions shown; findings below may reference images not displayed]

FINDINGS: Mammographic images were obtained following ultrasound guided biopsy
of the right breast. The biopsy marking clip is in expected position
at the site of biopsy.
IMPRESSION: Appropriate positioning of the coil shaped biopsy marking clip at
the site of biopsy in the lower outer quadrant, posterior third.

Final Assessment: Post Procedure Mammograms for Marker Placement

## 2022-01-19 ENCOUNTER — Ambulatory Visit: Payer: Medicare Other | Admitting: Internal Medicine

## 2022-01-22 ENCOUNTER — Ambulatory Visit: Payer: Medicare Other | Admitting: Internal Medicine

## 2022-01-24 ENCOUNTER — Encounter: Payer: Self-pay | Admitting: Internal Medicine

## 2022-01-24 NOTE — Progress Notes (Unsigned)
Subjective:    Patient ID: Tami Jacobson, female    DOB: 01-19-1928, 88 y.o.   MRN: [id number]     HPI Ell[place] is here for follow up of her chronic medical problems, including B12 def, htn.  Here with a friend.    She is currently at [facility].    Medications and allergies reviewed with patient and updated if appropriate.  Current Outpatient Medications on File Prior to Visit  Medication Sig Dispense Refill   Accu-Chek Softclix Lancets lancets Use as instructed 100 each 4   acetaminophen (TYLENOL 8 HOUR) 650 MG CR tablet Take 2 tablets every 8 hours by oral route.     clobetasol cream (TEMOVATE) 0.05 % APPLY CREAM TOPICALLY TWICE DAILY AS NEEDED FOR IRRITATION FROM LICHEN SCLEROSUS     cyanocobalamin (,VITAMIN B-12,) 1000 MCG/ML injection Inject 1,000 mcg into the muscle every 30 (thirty) days.     flecainide (TAMBOCOR) 50 MG tablet TAKE 1 TABLET BY MOUTH  TWICE DAILY 180 tablet 3   fluticasone (FLONASE) 50 MCG/ACT nasal spray USE 2 SPRAY(S) IN EACH NOSTRIL ONCE DAILY(AT NIGHT)     glucose blood (ACCU-CHEK GUIDE) test strip USE AS DIRECTED 100 strip 4   hydrocortisone (ANUCORT-HC) 25 MG suppository      losartan (COZAAR) 25 MG tablet Take 1 tablet (25 mg total) by mouth daily. 90 tablet 2   metFORMIN (GLUCOPHAGE-XR) 500 MG 24 hr tablet TAKE 1 TABLET BY MOUTH ONCE DAILY WITH BREAKFAST 90 tablet 3   metoprolol succinate (TOPROL-XL) 50 MG 24 hr tablet TAKE 1 AND 1/2 TABLETS BY  MOUTH DAILY . TAKE WITH OR  IMMEDIATELY FOLLOWING A  MEAL 135 tablet 3   prednisoLONE acetate (PRED FORTE) 1 % ophthalmic suspension INSTILL 1 DROP INTO EACH EYE THREE TIMES DAILY FOR 1 WEEK THEN 1 DROP TWICE DAILY FOR SECOND & THIRD WEEK     omeprazole (PRILOSEC) 40 MG capsule Take 40 mg by mouth daily.     No current facility-administered medications on file prior to visit.     Review of Systems  Constitutional:  Negative for fever.  Respiratory:  Negative for cough, shortness of breath  and wheezing.   Cardiovascular:  Positive for leg swelling (minimal). Negative for chest pain and palpitations.  Neurological:  Positive for dizziness (when she leans forward). Negative for light-headedness and headaches.       Objective:   Vitals:   01/25/22 1351  BP: (!) 144/80  Pulse: (!) 58  Temp: 97.9 F (36.6 C)  SpO2: 97%   BP Readings from Last 3 Encounters:  01/25/22 (!) 144/80  12/18/21 132/68  11/11/21 134/64   Wt Readings from Last 3 Encounters:  01/25/22 118 lb 3.2 oz (53.6 kg)  12/18/21 118 lb (53.5 kg)  11/11/21 120 lb (54.4 kg)   Body mass index is 20.94 kg/m.    Physical Exam Constitutional:      General: She is not in acute distress.    Appearance: Normal appearance.  HENT:     Head: Normocephalic and atraumatic.  Eyes:     Conjunctiva/sclera: Conjunctivae normal.  Cardiovascular:     Rate and Rhythm: Normal rate and regular rhythm.     Heart sounds: Murmur (2/6 systolic) heard.  Pulmonary:     Effort: Pulmonary effort is normal. No respiratory distress.     Breath sounds: Normal breath sounds. No wheezing.  Musculoskeletal:     Cervical back: Neck supple.     Right  lower leg: No edema.     Left lower leg: No edema.  Lymphadenopathy:     Cervical: No cervical adenopathy.  Skin:    General: Skin is warm and dry.     Findings: No rash.  Neurological:     Mental Status: She is alert. Mental status is at baseline.  Psychiatric:        Mood and Affect: Mood normal.        Behavior: Behavior normal.        Lab Results  Component Value Date   WBC 5.2 11/11/2021   HGB 10.2 (L) 11/11/2021   HCT 30.2 (L) 11/11/2021   PLT 140.0 (L) 11/11/2021   GLUCOSE 157 (H) 11/11/2021   CHOL 173 02/22/2019   TRIG 157.0 (H) 02/22/2019   HDL 50.40 02/22/2019   LDLDIRECT 128.3 12/26/2012   LDLCALC 92 02/22/2019   ALT 10 11/11/2021   AST 15 11/11/2021   NA 138 11/11/2021   K 4.4 11/11/2021   CL 102 11/11/2021   CREATININE 0.89 11/11/2021   BUN 13  11/11/2021   CO2 31 11/11/2021   TSH 2.519 05/15/2019   HGBA1C 6.7 (H) 11/11/2021   MICROALBUR 1.0 08/23/2018     Assessment & Plan:    See Problem List for Assessment and Plan of chronic medical problems.

## 2022-01-25 ENCOUNTER — Ambulatory Visit (INDEPENDENT_AMBULATORY_CARE_PROVIDER_SITE_OTHER): Payer: Medicare Other | Admitting: Internal Medicine

## 2022-01-25 VITALS — BP 144/80 | HR 58 | Temp 97.9°F | Ht 63.0 in | Wt 118.2 lb

## 2022-01-25 DIAGNOSIS — I10 Essential (primary) hypertension: Secondary | ICD-10-CM | POA: Diagnosis not present

## 2022-01-25 DIAGNOSIS — E538 Deficiency of other specified B group vitamins: Secondary | ICD-10-CM

## 2022-01-25 MED ORDER — CYANOCOBALAMIN 1000 MCG/ML IJ SOLN
1000.0000 ug | Freq: Once | INTRAMUSCULAR | Status: AC
  Administered 2022-01-25: 1000 ug via INTRAMUSCULAR

## 2022-01-25 NOTE — Assessment & Plan Note (Signed)
Chronic B12 injection today Will stop monthly injection and have her start oral B12 1000 mcg daily

## 2022-01-25 NOTE — Assessment & Plan Note (Signed)
Chronic BP adequately controlled for age Continue metoprolol xl 75 mg daily, losartan 25 mg daily

## 2022-01-25 NOTE — Addendum Note (Signed)
Addended by: Marcina Millard on: 01/25/2022 04:36 PM   Modules accepted: Orders

## 2022-01-25 NOTE — Patient Instructions (Addendum)
     You received a B12 injection today.    Blood work was ordered.   The lab is on the first floor.    Medications changes include :   start taking a B12 vitamin 1000 mcg daily     Return for cancel appt in December,  follow up in 4 months.

## 2022-02-17 ENCOUNTER — Encounter (HOSPITAL_BASED_OUTPATIENT_CLINIC_OR_DEPARTMENT_OTHER): Payer: Self-pay | Admitting: Emergency Medicine

## 2022-02-17 ENCOUNTER — Encounter: Payer: Self-pay | Admitting: Podiatry

## 2022-02-17 ENCOUNTER — Ambulatory Visit: Payer: Medicare Other | Admitting: Podiatry

## 2022-02-17 ENCOUNTER — Emergency Department (HOSPITAL_BASED_OUTPATIENT_CLINIC_OR_DEPARTMENT_OTHER)
Admission: EM | Admit: 2022-02-17 | Discharge: 2022-02-17 | Disposition: A | Discharge status: Home / Self Care | Payer: Medicare Other | Attending: Emergency Medicine | Admitting: Emergency Medicine

## 2022-02-17 ENCOUNTER — Other Ambulatory Visit: Payer: Self-pay

## 2022-02-17 VITALS — BP 224/73

## 2022-02-17 DIAGNOSIS — R001 Bradycardia, unspecified: Secondary | ICD-10-CM | POA: Diagnosis not present

## 2022-02-17 DIAGNOSIS — B351 Tinea unguium: Secondary | ICD-10-CM | POA: Diagnosis not present

## 2022-02-17 DIAGNOSIS — M79674 Pain in right toe(s): Secondary | ICD-10-CM

## 2022-02-17 DIAGNOSIS — M79675 Pain in left toe(s): Secondary | ICD-10-CM

## 2022-02-17 DIAGNOSIS — I10 Essential (primary) hypertension: Secondary | ICD-10-CM | POA: Diagnosis not present

## 2022-02-17 DIAGNOSIS — R030 Elevated blood-pressure reading, without diagnosis of hypertension: Secondary | ICD-10-CM | POA: Diagnosis present

## 2022-02-17 DIAGNOSIS — I161 Hypertensive emergency: Secondary | ICD-10-CM | POA: Diagnosis not present

## 2022-02-17 DIAGNOSIS — I160 Hypertensive urgency: Secondary | ICD-10-CM | POA: Diagnosis not present

## 2022-02-17 DIAGNOSIS — I959 Hypotension, unspecified: Secondary | ICD-10-CM | POA: Diagnosis not present

## 2022-02-17 LAB — CBC WITH DIFFERENTIAL/PLATELET
Abs Immature Granulocytes: 0.01 10*3/uL (ref 0.00–0.07)
Basophils Absolute: 0 10*3/uL (ref 0.0–0.1)
Basophils Relative: 0 %
Eosinophils Absolute: 0.1 10*3/uL (ref 0.0–0.5)
Eosinophils Relative: 2 %
HCT: 31.2 % — ABNORMAL LOW (ref 36.0–46.0)
Hemoglobin: 10.2 g/dL — ABNORMAL LOW (ref 12.0–15.0)
Immature Granulocytes: 0 %
Lymphocytes Relative: 20 %
Lymphs Abs: 0.7 10*3/uL (ref 0.7–4.0)
MCH: 32 pg (ref 26.0–34.0)
MCHC: 32.7 g/dL (ref 30.0–36.0)
MCV: 97.8 fL (ref 80.0–100.0)
Monocytes Absolute: 0.4 10*3/uL (ref 0.1–1.0)
Monocytes Relative: 11 %
Neutro Abs: 2.3 10*3/uL (ref 1.7–7.7)
Neutrophils Relative %: 67 %
Platelets: 134 10*3/uL — ABNORMAL LOW (ref 150–400)
RBC: 3.19 MIL/uL — ABNORMAL LOW (ref 3.87–5.11)
RDW: 13.6 % (ref 11.5–15.5)
WBC: 3.4 10*3/uL — ABNORMAL LOW (ref 4.0–10.5)
nRBC: 0 % (ref 0.0–0.2)

## 2022-02-17 LAB — BASIC METABOLIC PANEL
Anion gap: 8 (ref 5–15)
BUN: 7 mg/dL — ABNORMAL LOW (ref 8–23)
CO2: 29 mmol/L (ref 22–32)
Calcium: 9 mg/dL (ref 8.9–10.3)
Chloride: 106 mmol/L (ref 98–111)
Creatinine, Ser: 0.8 mg/dL (ref 0.44–1.00)
GFR, Estimated: >60 mL/min (ref >60)
Glucose, Bld: 174 mg/dL — ABNORMAL HIGH (ref 70–99)
Potassium: 3.5 mmol/L (ref 3.5–5.1)
Sodium: 143 mmol/L (ref 135–145)

## 2022-02-17 MED ORDER — LOSARTAN POTASSIUM 25 MG PO TABS: 50.0000 mg | ORAL_TABLET | Freq: Every day | ORAL | 2 refills | Status: DC

## 2022-02-17 NOTE — Progress Notes (Signed)
Subjective:  Patient ID: Tami Jacobson, female    DOB: April 20, 1927,  MRN: [id number]  Gustavo Keiva Dina presents to clinic today for:  Chief Complaint  Patient presents with   Nail Problem    Diabetic foot care BS-did not check A1C-do not remember  PCP-Stacy Burns PCP VST-4 months ago   New problem(s): Patient states her blood pressure was elevated at home this morning. She relates change in her BP meds on her last visit with PCP. She was instructed by facility staff to have her BP checked here at her visit on today. She is a resident in [place] at [facility].  Patient states she doesn't feel well. Denies any headache, slurred speech, or dizziness.  PCP is Binnie Rail, MD , and last visit was January 25, 2022.  Allergies  Allergen Reactions   Statins Other (See Comments)    GI UPSET   Effexor [Venlafaxine]     Constipation, shaking, dizzy   Gabapentin     drowsniess   Cephalexin Other (See Comments)    Reaction not recalled by the patient   Codeine Other (See Comments)    Reaction not recalled by the patient   Lansoprazole Other (See Comments)    Reaction not recalled by the patient   Latex Rash        Levofloxacin Other (See Comments)    Reaction not recalled by the patient   Nitrofurantoin Other (See Comments)    Reaction not recalled by the patient   Rofecoxib Other (See Comments)    Reaction not recalled by the patient    Review of Systems: Negative except as noted in the HPI.  Objective: Vitals:   02/17/22 1155  BP: (!) 224/73  BP #2: 231/79 Manual BP#3: 240/80  Tami Jacobson is a pleasant 88 y.o. female thin build in NAD. AAO x 3. Patient is anxious.  Vascular Examination: Vascular status intact b/l with palpable pedal pulses. Pedal hair present b/l. CFT immediate b/l. No edema. No pain with calf compression b/l. Skin temperature gradient WNL b/l.   Neurological Examination: Sensation grossly intact b/l with 10 gram  monofilament. Vibratory sensation intact b/l.   Dermatological Examination: Pedal skin with normal turgor, texture and tone b/l. Toenails 1-5 b/l thick, discolored, elongated with subungual debris and pain on dorsal palpation. No hyperkeratotic lesions noted b/l.   Musculoskeletal Examination: Muscle strength 5/5 to b/l LE. No pain, crepitus or joint limitation noted with ROM bilateral LE. No gross bony deformities bilaterally. Utilizes rollator for ambulation assistance, but we transferred her to transport chair.  Radiographs: None  Assessment/Plan: 1. Pain due to onychomycosis of toenails of both feet   2. Hypertensive emergency     No orders of the defined types were placed in this encounter.   -Patient was evaluated and treated. All patient's and/or POA's questions/concerns answered on today's visit. -Patient remained asymptomatic with elevated BP. We discussed need for her to be medically evaluated with her hypertensive urgency. Patient wanted to speak to Dr. Quay Burow. I phoned Dr. Quay Burow and related today's blood pressures. Dr. Quay Burow spoke to patient on the phone and recommended Ms. Mroz be evaluated at [facility] ED. Patient agreed. 911 was dispatched. Patient was transferred to EMTs in NAD. She did change her mind and expressed wishes to be evaluated at [facility] ED. Spoke to [facility] and patient eventually went to [facility] ED. -Patient's POA Tawnya Crook was contacted and informed of events. -Toenails 1-5 b/l were debrided in length and  girth with dremel without iatrogenic bleeding.  -Patient/POA to call should there be question/concern in the interim.   Return in about 3 months (around 05/20/2022).  Marzetta Board, DPM

## 2022-02-17 NOTE — Discharge Instructions (Addendum)
You were seen today for elevated blood pressure.  Fortunately your labs are reassuring, Your EKG looks reassuring and your blood pressure has been stable in the low 200s.  After our discussion I believe this is likely secondary to your medication changes including your losartan being decreased to 25.  Please increase your losartan back to 50 daily and call your primary care doctor to let them know of the change.  Thank for the opportunity to participate in her care, Tretha Sciara MD

## 2022-02-17 NOTE — ED Provider Notes (Signed)
[facility] EMERGENCY DEPT Provider Note   CSN: [id number] Arrival date & time: 02/17/22  1326     History Chief Complaint  Patient presents with   Hypertension    HPI Tami Jacobson is a 88 y.o. female presenting for chief complaint of high blood pressure readings.  She has been asymptomatic but without a podiatry appointment for a bunion when they noted her blood pressure to be 200/60.  She recently had her losartan changed from 50 mg daily to 25 mg daily.  She has not been checking her blood pressure.  She denies fevers or chills, nausea vomiting, syncope shortness of breath.  She denies any headaches visual changes or chest pain at this time.  She is ambulatory tolerating p.o. intake..   Patient's recorded medical, surgical, social, medication list and allergies were reviewed in the Snapshot window as part of the initial history.   Review of Systems   Review of Systems  Constitutional:  Negative for chills and fever.  HENT:  Negative for ear pain and sore throat.   Eyes:  Negative for pain and visual disturbance.  Respiratory:  Negative for cough and shortness of breath.   Cardiovascular:  Negative for chest pain and palpitations.  Gastrointestinal:  Negative for abdominal pain and vomiting.  Genitourinary:  Negative for dysuria and hematuria.  Musculoskeletal:  Negative for arthralgias and back pain.  Skin:  Negative for color change and rash.  Neurological:  Negative for seizures and syncope.  All other systems reviewed and are negative.   Physical Exam Updated Vital Signs BP (!) 206/64   Pulse 61   Temp 98.2 F (36.8 C) (Oral)   Resp 16   Ht '5\' 3"'$  (1.6 m)   Wt 52.2 kg   SpO2 99%   BMI 20.37 kg/m  Physical Exam Vitals and nursing note reviewed.  Constitutional:      General: She is not in acute distress.    Appearance: She is well-developed.  HENT:     Head: Normocephalic and atraumatic.  Eyes:     Conjunctiva/sclera: Conjunctivae  normal.  Cardiovascular:     Rate and Rhythm: Normal rate and regular rhythm.     Heart sounds: No murmur heard. Pulmonary:     Effort: Pulmonary effort is normal. No respiratory distress.     Breath sounds: Normal breath sounds.  Abdominal:     Palpations: Abdomen is soft.     Tenderness: There is no abdominal tenderness.  Musculoskeletal:        General: No swelling.     Cervical back: Neck supple.  Skin:    General: Skin is warm and dry.     Capillary Refill: Capillary refill takes less than 2 seconds.  Neurological:     Mental Status: She is alert.  Psychiatric:        Mood and Affect: Mood normal.      ED Course/ Medical Decision Making/ A&P    Procedures Procedures   Medications Ordered in ED Medications - No data to display Medical Decision Making:   Tami Jacobson is a 88 y.o. female who presented to the ED today with EBP detailed above.    Additional history discussed with patient's family/caregivers.  Patient placed on continuous vitals and telemetry monitoring while in ED which was reviewed periodically.  Complete initial physical exam performed, notably the patient  was hypertensive otherwise in no acute distress.    Reviewed and confirmed nursing documentation for past medical history, family history, social  history.    Initial Assessment:   With the patient's presentation of elevated blood pressure readings, most likely diagnosis is hypertensive urgency. Other diagnoses associated with hypertensive emergency were considered including (but not limited to) intracranial hemorrhage, acute renal artery stenosis, acute kidney injury, myocardial stress, ophthalmologic emergencies. These are considered less likely due to history of present illness and physical exam findings.   This is most consistent with an acute life/limb threatening illness complicated by underlying chronic conditions. Will evaluate for hypertensive emergency as below. Initial Plan:   Screening labs including CBC and Metabolic panel to evaluate for infectious or metabolic etiology of disease.  EKG to evaluate for cardiac pathology. Objective evaluation as below reviewed. Considered further administration of antihypertensives in ED, per consensus guidelines for [facility] of emergency physicians, acute treatment of hypertensive urgency alone in the emergency department is not recommended.  If patient has evidence of hypertensive emergency on objective laboratory evaluation, will reevaluate.  Will monitor blood pressure while patient awaiting above laboratory studies.  Initial Study Results:   Laboratory  All laboratory results reviewed without evidence of clinically relevant pathology.     EKG EKG was reviewed independently. Rate, rhythm, axis, intervals all examined and without medically relevant abnormality. ST segments without concerns for elevations.    Final Assessment and Plan:   Labs grossly reassuring.  Diagnosis most consistent with hypertensive urgency.  Patient otherwise stable for outpatient care and management.  Disposition:  I have considered need for hospitalization, however, considering all of the above, I believe this patient is stable for discharge at this time.  Patient/family educated about specific return precautions for given chief complaint and symptoms.  Patient/family educated about follow-up with PCP .     Patient/family expressed understanding of return precautions and need for follow-up. Patient spoken to regarding all imaging and laboratory results and appropriate follow up for these results. All education provided in verbal form with additional information in written form. Time was allowed for answering of patient questions. Patient discharged.    Emergency Department Medication Summary:   Medications - No data to display        Clinical Impression:  1. Hypertensive urgency      Discharge   Clinical Impression:  1.  Hypertensive urgency      Discharge   Final Clinical Impression(s) / ED Diagnoses Final diagnoses:  Hypertensive urgency    Rx / DC Orders ED Discharge Orders          Ordered    losartan (COZAAR) 25 MG tablet  Daily       Note to Pharmacy: New dose   02/17/22 1506              Tretha Sciara, MD 02/17/22 [phone]

## 2022-02-17 NOTE — ED Triage Notes (Signed)
Pt BIB [facility] from [facility], reported HTN. Pt denies HA  Speech clear. Pt aox4, deneis CP, denies shob

## 2022-02-22 NOTE — Progress Notes (Unsigned)
    Subjective:    Patient ID: Tami Jacobson, female    DOB: April 01, 1928, 88 y.o.   MRN: [id number]      HPI Tami Jacobson is here for No chief complaint on file.   Blood pressure issues-   Podiatry appt 11/15 - BP 224/73, 231/79, 240/80 - went to ED - BP 206/64. She denied CP, SOB, palps, headaches.  Her losartan was increased from 25 mg daily to 50 mg daily.     Medications and allergies reviewed with patient and updated if appropriate.  Current Outpatient Medications on File Prior to Visit  Medication Sig Dispense Refill   Accu-Chek Softclix Lancets lancets Use as instructed 100 each 4   acetaminophen (TYLENOL 8 HOUR) 650 MG CR tablet Take 2 tablets every 8 hours by oral route.     clobetasol cream (TEMOVATE) 0.05 % APPLY CREAM TOPICALLY TWICE DAILY AS NEEDED FOR IRRITATION FROM LICHEN SCLEROSUS     cyanocobalamin (,VITAMIN B-12,) 1000 MCG/ML injection Inject 1,000 mcg into the muscle every 30 (thirty) days.     flecainide (TAMBOCOR) 50 MG tablet TAKE 1 TABLET BY MOUTH  TWICE DAILY 180 tablet 3   fluticasone (FLONASE) 50 MCG/ACT nasal spray USE 2 SPRAY(S) IN EACH NOSTRIL ONCE DAILY(AT NIGHT)     glucose blood (ACCU-CHEK GUIDE) test strip USE AS DIRECTED 100 strip 4   hydrocortisone (ANUCORT-HC) 25 MG suppository      losartan (COZAAR) 25 MG tablet Take 2 tablets (50 mg total) by mouth daily. 90 tablet 2   metFORMIN (GLUCOPHAGE-XR) 500 MG 24 hr tablet TAKE 1 TABLET BY MOUTH ONCE DAILY WITH BREAKFAST 90 tablet 3   metoprolol succinate (TOPROL-XL) 50 MG 24 hr tablet TAKE 1 AND 1/2 TABLETS BY  MOUTH DAILY . TAKE WITH OR  IMMEDIATELY FOLLOWING A  MEAL 135 tablet 3   omeprazole (PRILOSEC) 40 MG capsule Take 40 mg by mouth daily.     prednisoLONE acetate (PRED FORTE) 1 % ophthalmic suspension INSTILL 1 DROP INTO EACH EYE THREE TIMES DAILY FOR 1 WEEK THEN 1 DROP TWICE DAILY FOR SECOND & THIRD WEEK     No current facility-administered medications on file prior to visit.    Review of  Systems     Objective:  There were no vitals filed for this visit. BP Readings from Last 3 Encounters:  02/17/22 (!) 199/54  02/17/22 (!) 224/73  01/25/22 (!) 144/80   Wt Readings from Last 3 Encounters:  02/17/22 115 lb (52.2 kg)  01/25/22 118 lb 3.2 oz (53.6 kg)  12/18/21 118 lb (53.5 kg)   There is no height or weight on file to calculate BMI.    Physical Exam         Assessment & Plan:    See Problem List for Assessment and Plan of chronic medical problems.

## 2022-02-23 ENCOUNTER — Ambulatory Visit (INDEPENDENT_AMBULATORY_CARE_PROVIDER_SITE_OTHER): Payer: Medicare Other | Admitting: Internal Medicine

## 2022-02-23 ENCOUNTER — Encounter: Payer: Self-pay | Admitting: Internal Medicine

## 2022-02-23 VITALS — BP 138/80 | HR 63 | Temp 98.5°F | Ht 63.0 in | Wt 120.0 lb

## 2022-02-23 DIAGNOSIS — I10 Essential (primary) hypertension: Secondary | ICD-10-CM

## 2022-02-23 MED ORDER — LOSARTAN POTASSIUM 100 MG PO TABS: 100.0000 mg | ORAL_TABLET | Freq: Every day | ORAL | 1 refills | Status: DC

## 2022-02-23 NOTE — Assessment & Plan Note (Addendum)
Chronic Recent episode of hypertensive urgency Blood pressure not ideally controlled-looks pretty good here today, but yesterday at home it was elevated Losartan increased from 25 mg to 50 mg in the emergency room-will increase to 100 mg daily Continue metoprolol XL 75 mg daily She will monitor her blood pressure twice daily and keep a log She will return in 1 week and bring her blood pressure log and machine so that we can make sure it is accurate Will do blood work at that visit She will call there is any concerns regarding her blood pressure before she returns if needed

## 2022-02-23 NOTE — Patient Instructions (Addendum)
     Medications changes include :     increase losartan to 100 mg daily   Try taking the omeprazole every other day.     Monitor and log your blood pressure readings.  Bring your BP cuff with you next week.    Return for next week for follow up of HTN.

## 2022-02-28 NOTE — Progress Notes (Signed)
Subjective:    Patient ID: Tami Jacobson, female    DOB: July 24, 1927, 88 y.o.   MRN: [id number]     HPI Tami Jacobson is here for follow up of her htn  We increased her losartan to 100 mg daily last week.   BP at home - 159-166/77-85   Feels tired during the day and has no energy.  She does nap sitting up during the day.  This is not new and has been going on for a while.  She is not exercising during the day.  Medications and allergies reviewed with patient and updated if appropriate.  Current Outpatient Medications on File Prior to Visit  Medication Sig Dispense Refill   acetaminophen (TYLENOL 8 HOUR) 650 MG CR tablet Take 2 tablets every 8 hours by oral route.     clobetasol cream (TEMOVATE) 0.05 % APPLY CREAM TOPICALLY TWICE DAILY AS NEEDED FOR IRRITATION FROM LICHEN SCLEROSUS     cyanocobalamin (,VITAMIN B-12,) 1000 MCG/ML injection Inject 1,000 mcg into the muscle every 30 (thirty) days.     flecainide (TAMBOCOR) 50 MG tablet TAKE 1 TABLET BY MOUTH  TWICE DAILY 180 tablet 3   losartan (COZAAR) 100 MG tablet Take 1 tablet (100 mg total) by mouth daily. 90 tablet 1   metoprolol succinate (TOPROL-XL) 50 MG 24 hr tablet TAKE 1 AND 1/2 TABLETS BY  MOUTH DAILY . TAKE WITH OR  IMMEDIATELY FOLLOWING A  MEAL 135 tablet 3   omeprazole (PRILOSEC) 40 MG capsule Take 40 mg by mouth daily.     No current facility-administered medications on file prior to visit.     Review of Systems  Constitutional:  Positive for fatigue.  Respiratory:  Negative for shortness of breath.   Cardiovascular:  Positive for leg swelling (mild at ankles). Negative for chest pain and palpitations.  Neurological:  Negative for light-headedness and headaches.       Objective:   Vitals:   03/01/22 1417 03/01/22 1445  BP: (!) 158/68 (!) 148/90  Pulse: 75   Temp: 97.9 F (36.6 C)   SpO2: 97%    BP Readings from Last 3 Encounters:  03/01/22 (!) 148/90  02/23/22 138/80  02/17/22 (!) 199/54    Wt Readings from Last 3 Encounters:  03/01/22 121 lb (54.9 kg)  02/23/22 120 lb (54.4 kg)  02/17/22 115 lb (52.2 kg)   Body mass index is 21.43 kg/m.    Physical Exam Constitutional:      General: She is not in acute distress.    Appearance: Normal appearance.  HENT:     Head: Normocephalic and atraumatic.  Eyes:     Conjunctiva/sclera: Conjunctivae normal.  Cardiovascular:     Rate and Rhythm: Normal rate and regular rhythm.     Heart sounds: Murmur (3/6 systolic) heard.  Pulmonary:     Effort: Pulmonary effort is normal. No respiratory distress.     Breath sounds: Normal breath sounds. No wheezing.  Musculoskeletal:     Cervical back: Neck supple.     Right lower leg: No edema.     Left lower leg: No edema.  Lymphadenopathy:     Cervical: No cervical adenopathy.  Skin:    General: Skin is warm and dry.     Findings: No rash.  Neurological:     Mental Status: She is alert. Mental status is at baseline.  Psychiatric:        Mood and Affect: Mood normal.  Behavior: Behavior normal.        Lab Results  Component Value Date   WBC 3.4 (L) 02/17/2022   HGB 10.2 (L) 02/17/2022   HCT 31.2 (L) 02/17/2022   PLT 134 (L) 02/17/2022   GLUCOSE 174 (H) 02/17/2022   CHOL 173 02/22/2019   TRIG 157.0 (H) 02/22/2019   HDL 50.40 02/22/2019   LDLDIRECT 128.3 12/26/2012   LDLCALC 92 02/22/2019   ALT 10 11/11/2021   AST 15 11/11/2021   NA 143 02/17/2022   K 3.5 02/17/2022   CL 106 02/17/2022   CREATININE 0.80 02/17/2022   BUN 7 (L) 02/17/2022   CO2 29 02/17/2022   TSH 2.519 05/15/2019   HGBA1C 6.7 (H) 11/11/2021   MICROALBUR 1.0 08/23/2018     Assessment & Plan:    See Problem List for Assessment and Plan of chronic medical problems.

## 2022-03-01 ENCOUNTER — Other Ambulatory Visit: Payer: Self-pay | Admitting: Internal Medicine

## 2022-03-01 ENCOUNTER — Encounter: Payer: Self-pay | Admitting: Internal Medicine

## 2022-03-01 ENCOUNTER — Ambulatory Visit (INDEPENDENT_AMBULATORY_CARE_PROVIDER_SITE_OTHER): Payer: Medicare Other | Admitting: Internal Medicine

## 2022-03-01 VITALS — BP 148/90 | HR 75 | Temp 97.9°F | Ht 63.0 in | Wt 121.0 lb

## 2022-03-01 DIAGNOSIS — E118 Type 2 diabetes mellitus with unspecified complications: Secondary | ICD-10-CM

## 2022-03-01 DIAGNOSIS — I10 Essential (primary) hypertension: Secondary | ICD-10-CM | POA: Diagnosis not present

## 2022-03-01 MED ORDER — AMLODIPINE BESYLATE 2.5 MG PO TABS: 2.5000 mg | ORAL_TABLET | Freq: Every day | ORAL | 1 refills | Status: DC

## 2022-03-01 NOTE — Assessment & Plan Note (Signed)
Chronic Not ideally controlled Continue losartan 100 mg daily, metoprolol XL 75 mg daily Add amlodipine 2.5 mg daily Advised watching the sodium in her diet Continue to monitor BP at home-can also have the nurse at [facility] monitor Follow-up as scheduled, sooner if blood pressures not controlled

## 2022-03-01 NOTE — Patient Instructions (Addendum)
      Blood work was ordered.   The lab is on the first floor.    Medications changes include :   start amlodipine 2.5 mg daily      Return for follow up as scheduled.

## 2022-03-03 DIAGNOSIS — Z9181 History of falling: Secondary | ICD-10-CM | POA: Diagnosis not present

## 2022-03-03 DIAGNOSIS — M6281 Muscle weakness (generalized): Secondary | ICD-10-CM | POA: Diagnosis not present

## 2022-03-04 DIAGNOSIS — R2689 Other abnormalities of gait and mobility: Secondary | ICD-10-CM | POA: Diagnosis not present

## 2022-03-04 DIAGNOSIS — M6281 Muscle weakness (generalized): Secondary | ICD-10-CM | POA: Diagnosis not present

## 2022-03-04 DIAGNOSIS — M6259 Muscle wasting and atrophy, not elsewhere classified, multiple sites: Secondary | ICD-10-CM | POA: Diagnosis not present

## 2022-03-04 DIAGNOSIS — R2681 Unsteadiness on feet: Secondary | ICD-10-CM | POA: Diagnosis not present

## 2022-03-05 ENCOUNTER — Emergency Department (HOSPITAL_COMMUNITY)
Admission: EM | Admit: 2022-03-05 | Discharge: 2022-03-06 | Disposition: A | Discharge status: Nursing Home (Includes ICF) | Payer: Medicare Other | Attending: Emergency Medicine | Admitting: Emergency Medicine

## 2022-03-05 ENCOUNTER — Encounter (HOSPITAL_COMMUNITY): Payer: Self-pay

## 2022-03-05 ENCOUNTER — Emergency Department (HOSPITAL_COMMUNITY): Payer: Medicare Other

## 2022-03-05 ENCOUNTER — Other Ambulatory Visit: Payer: Self-pay

## 2022-03-05 ENCOUNTER — Telehealth: Payer: Self-pay | Admitting: Internal Medicine

## 2022-03-05 ENCOUNTER — Ambulatory Visit: Payer: Medicare Other | Admitting: Internal Medicine

## 2022-03-05 DIAGNOSIS — Z1152 Encounter for screening for COVID-19: Secondary | ICD-10-CM | POA: Diagnosis not present

## 2022-03-05 DIAGNOSIS — Z7984 Long term (current) use of oral hypoglycemic drugs: Secondary | ICD-10-CM | POA: Insufficient documentation

## 2022-03-05 DIAGNOSIS — Z91040 Latex allergy status: Secondary | ICD-10-CM | POA: Diagnosis not present

## 2022-03-05 DIAGNOSIS — E119 Type 2 diabetes mellitus without complications: Secondary | ICD-10-CM | POA: Diagnosis not present

## 2022-03-05 DIAGNOSIS — R509 Fever, unspecified: Secondary | ICD-10-CM | POA: Diagnosis not present

## 2022-03-05 DIAGNOSIS — Z79899 Other long term (current) drug therapy: Secondary | ICD-10-CM | POA: Diagnosis not present

## 2022-03-05 DIAGNOSIS — R0689 Other abnormalities of breathing: Secondary | ICD-10-CM | POA: Diagnosis not present

## 2022-03-05 DIAGNOSIS — E871 Hypo-osmolality and hyponatremia: Secondary | ICD-10-CM | POA: Insufficient documentation

## 2022-03-05 DIAGNOSIS — Z743 Need for continuous supervision: Secondary | ICD-10-CM | POA: Diagnosis not present

## 2022-03-05 DIAGNOSIS — R531 Weakness: Secondary | ICD-10-CM | POA: Diagnosis not present

## 2022-03-05 DIAGNOSIS — R2681 Unsteadiness on feet: Secondary | ICD-10-CM | POA: Diagnosis not present

## 2022-03-05 DIAGNOSIS — R0602 Shortness of breath: Secondary | ICD-10-CM | POA: Diagnosis not present

## 2022-03-05 DIAGNOSIS — R6889 Other general symptoms and signs: Secondary | ICD-10-CM | POA: Diagnosis not present

## 2022-03-05 DIAGNOSIS — Z9181 History of falling: Secondary | ICD-10-CM | POA: Diagnosis not present

## 2022-03-05 LAB — RESP PANEL BY RT-PCR (FLU A&B, COVID) ARPGX2
Influenza A by PCR: NEGATIVE
Influenza B by PCR: NEGATIVE
SARS Coronavirus 2 by RT PCR: NEGATIVE

## 2022-03-05 LAB — CBC
HCT: 34.5 % — ABNORMAL LOW (ref 36.0–46.0)
Hemoglobin: 11.4 g/dL — ABNORMAL LOW (ref 12.0–15.0)
MCH: 32.6 pg (ref 26.0–34.0)
MCHC: 33 g/dL (ref 30.0–36.0)
MCV: 98.6 fL (ref 80.0–100.0)
Platelets: 153 10*3/uL (ref 150–400)
RBC: 3.5 MIL/uL — ABNORMAL LOW (ref 3.87–5.11)
RDW: 13.3 % (ref 11.5–15.5)
WBC: 13.2 10*3/uL — ABNORMAL HIGH (ref 4.0–10.5)
nRBC: 0 % (ref 0.0–0.2)

## 2022-03-05 LAB — MAGNESIUM: Magnesium: 1.9 mg/dL (ref 1.7–2.4)

## 2022-03-05 LAB — COMPREHENSIVE METABOLIC PANEL
ALT: 13 U/L (ref 0–44)
AST: 24 U/L (ref 15–41)
Albumin: 3.7 g/dL (ref 3.5–5.0)
Alkaline Phosphatase: 46 U/L (ref 38–126)
Anion gap: 7 (ref 5–15)
BUN: 12 mg/dL (ref 8–23)
CO2: 26 mmol/L (ref 22–32)
Calcium: 8.7 mg/dL — ABNORMAL LOW (ref 8.9–10.3)
Chloride: 96 mmol/L — ABNORMAL LOW (ref 98–111)
Creatinine, Ser: 0.74 mg/dL (ref 0.44–1.00)
GFR, Estimated: >60 mL/min (ref >60)
Glucose, Bld: 176 mg/dL — ABNORMAL HIGH (ref 70–99)
Potassium: 3.7 mmol/L (ref 3.5–5.1)
Sodium: 129 mmol/L — ABNORMAL LOW (ref 135–145)
Total Bilirubin: 1.2 mg/dL (ref 0.3–1.2)
Total Protein: 7.1 g/dL (ref 6.5–8.1)

## 2022-03-05 LAB — TROPONIN I (HIGH SENSITIVITY)
Troponin I (High Sensitivity): 14 ng/L (ref <18)
Troponin I (High Sensitivity): 12 ng/L (ref <18)

## 2022-03-05 LAB — LACTIC ACID, PLASMA: Lactic Acid, Venous: 1.4 mmol/L (ref 0.5–1.9)

## 2022-03-05 MED ORDER — ACETAMINOPHEN 325 MG PO TABS: 650.0000 mg | ORAL_TABLET | Freq: Once | ORAL | Status: DC

## 2022-03-05 MED ORDER — SODIUM CHLORIDE 0.9 % IV SOLN: INTRAVENOUS | Status: DC

## 2022-03-05 MED ORDER — SODIUM CHLORIDE 0.9 % IV BOLUS
500.0000 mL | Freq: Once | INTRAVENOUS | Status: AC
  Administered 2022-03-05: 500 mL via INTRAVENOUS

## 2022-03-05 NOTE — Telephone Encounter (Signed)
Spoke with Cyril Mourning and advised ED for evaluation

## 2022-03-05 NOTE — ED Provider Notes (Signed)
[facility] DEPT Provider Note   CSN: [id number] Arrival date & time: 03/05/22  1721     History  Chief Complaint  Patient presents with   Weakness    Tami Jacobson is a 88 y.o. female.  Patient brought in by EMS from [facility].  Patient lives in apartment by herself.  Friend that lives next door contacted EMS reported the patient had sort of weakness prior to lunch today.  Normally well ambulates with a walker but she was too weak to get off the couch and could not walk alone.  Upon arrival here patient had a fever with a temp of 100.5 blood pressure 177/52 oxygen sats on room air 93% heart rate 72 and respiratory rate is 18.  Past medical history is significant for type 2 diabetes premature atrial contractions premature ventricular contractions history of reflux disease.  Patient never used tobacco products.  Patient last seen in the emergency department on November 15 for hypertensive emergency at [facility] but did not require admission.  Patient is awake and alert very goes by the name Tami Jacobson not [place].  She denies any chest pain shortness of breath cough any dysuria abdominal pain did not know she had a fever.  But has had some urine incontinence today.  No nausea vomiting or diarrhea.       Home Medications Prior to Admission medications   Medication Sig Start Date End Date Taking? Authorizing Provider  acetaminophen (TYLENOL 8 HOUR) 650 MG CR tablet Take 2 tablets every 8 hours by oral route.    [provider]  amLODipine (NORVASC) 2.5 MG tablet Take 1 tablet (2.5 mg total) by mouth daily. 03/01/22   Binnie Rail, MD  clobetasol cream (TEMOVATE) 0.05 % APPLY CREAM TOPICALLY TWICE DAILY AS NEEDED FOR IRRITATION FROM LICHEN SCLEROSUS    [provider]  cyanocobalamin (,VITAMIN B-12,) 1000 MCG/ML injection Inject 1,000 mcg into the muscle every 30 (thirty) days.    [provider]  flecainide (TAMBOCOR) 50  MG tablet TAKE 1 TABLET BY MOUTH  TWICE DAILY 09/03/21   Lelon Perla, MD  losartan (COZAAR) 100 MG tablet Take 1 tablet (100 mg total) by mouth daily. 02/23/22   Binnie Rail, MD  metFORMIN (GLUCOPHAGE-XR) 500 MG 24 hr tablet TAKE 1 TABLET BY MOUTH ONCE  DAILY WITH BREAKFAST 03/01/22   Binnie Rail, MD  metoprolol succinate (TOPROL-XL) 50 MG 24 hr tablet TAKE 1 AND 1/2 TABLETS BY  MOUTH DAILY . TAKE WITH OR  IMMEDIATELY FOLLOWING A  MEAL 06/23/21   Lelon Perla, MD  omeprazole (PRILOSEC) 40 MG capsule Take 40 mg by mouth daily. 12/22/21   [provider]      Allergies    Statins, Effexor [venlafaxine], Gabapentin, Cephalexin, Codeine, Lansoprazole, Latex, Levofloxacin, Nitrofurantoin, and Rofecoxib    Review of Systems   Review of Systems  Constitutional:  Positive for fatigue. Negative for chills and fever.  HENT:  Negative for ear pain and sore throat.   Eyes:  Negative for pain and visual disturbance.  Respiratory:  Negative for cough and shortness of breath.   Cardiovascular:  Negative for chest pain and palpitations.  Gastrointestinal:  Negative for abdominal pain and vomiting.  Genitourinary:  Negative for dysuria and hematuria.  Musculoskeletal:  Negative for arthralgias and back pain.  Skin:  Negative for color change and rash.  Neurological:  Positive for weakness. Negative for seizures and syncope.  All other systems reviewed  and are negative.   Physical Exam Updated Vital Signs BP (!) 174/48 (BP Location: Left Arm)   Pulse 66   Temp 99.3 F (37.4 C) (Oral)   Resp (!) 21   Ht 1.6 m ('5\' 3"'$ )   Wt 54.9 kg   SpO2 100%   BMI 21.43 kg/m  Physical Exam Vitals and nursing note reviewed.  Constitutional:      General: She is not in acute distress.    Appearance: She is well-developed.  HENT:     Head: Normocephalic and atraumatic.     Mouth/Throat:     Mouth: Mucous membranes are moist.  Eyes:     Extraocular Movements: Extraocular movements  intact.     Conjunctiva/sclera: Conjunctivae normal.     Pupils: Pupils are equal, round, and reactive to light.  Cardiovascular:     Rate and Rhythm: Normal rate and regular rhythm.     Heart sounds: No murmur heard. Pulmonary:     Effort: Pulmonary effort is normal. No respiratory distress.     Breath sounds: Normal breath sounds. No wheezing, rhonchi or rales.  Abdominal:     General: There is no distension.     Palpations: Abdomen is soft.     Tenderness: There is no abdominal tenderness. There is no guarding.  Musculoskeletal:        General: No swelling.     Cervical back: Normal range of motion and neck supple. No rigidity.     Right lower leg: No edema.     Left lower leg: No edema.  Skin:    General: Skin is warm and dry.     Capillary Refill: Capillary refill takes less than 2 seconds.  Neurological:     General: No focal deficit present.     Mental Status: She is alert and oriented to person, place, and time.     Cranial Nerves: No cranial nerve deficit.     Sensory: No sensory deficit.     Motor: No weakness.     Comments: Patient not ambulated but no focal deficits good lower extremity strength and upper extremity strength.  No obvious cranial nerve deficits.  But patient not ambulated  Psychiatric:        Mood and Affect: Mood normal.     ED Results / Procedures / Treatments   Labs (all labs ordered are listed, but only abnormal results are displayed) Labs Reviewed  CBC - Abnormal; Notable for the following components:      Result Value   WBC 13.2 (*)    RBC 3.50 (*)    Hemoglobin 11.4 (*)    HCT 34.5 (*)    All other components within normal limits  COMPREHENSIVE METABOLIC PANEL - Abnormal; Notable for the following components:   Sodium 129 (*)    Chloride 96 (*)    Glucose, Bld 176 (*)    Calcium 8.7 (*)    All other components within normal limits  RESP PANEL BY RT-PCR (FLU A&B, COVID) ARPGX2  CULTURE, BLOOD (ROUTINE X 2)  CULTURE, BLOOD (ROUTINE X  2)  MAGNESIUM  LACTIC ACID, PLASMA  LACTIC ACID, PLASMA  URINALYSIS, ROUTINE W REFLEX MICROSCOPIC  TROPONIN I (HIGH SENSITIVITY)  TROPONIN I (HIGH SENSITIVITY)    EKG EKG Interpretation  Date/Time:  Friday March 05 2022 18:17:48 EST Ventricular Rate:  69 PR Interval:  171 QRS Duration: 102 QT Interval:  425 QTC Calculation: 456 R Axis:   268 Text Interpretation: Sinus rhythm Probable left atrial  enlargement Consider right ventricular hypertrophy Inferior infarct, old Artifact Confirmed by Fredia Sorrow [phone]) on 03/05/2022 10:00:21 PM  Radiology DG Chest 2 View  Result Date: 03/05/2022 CLINICAL DATA:  Weakness, short of breath EXAM: CHEST - 2 VIEW COMPARISON:  07/22/2021 FINDINGS: Frontal and lateral views of the chest demonstrate an unremarkable cardiac silhouette. No acute airspace disease, effusion, or pneumothorax. There is biapical pleural and parenchymal scarring. No acute bony abnormalities. IMPRESSION: 1. No acute intrathoracic process. Electronically Signed   By: Randa Ngo M.D.   On: 03/05/2022 18:18    Procedures Procedures    Medications Ordered in ED Medications  acetaminophen (TYLENOL) tablet 650 mg (650 mg Oral Patient Refused/Not Given 03/05/22 2258)  0.9 %  sodium chloride infusion ( Intravenous New Bag/Given 03/05/22 2324)  sodium chloride 0.9 % bolus 500 mL (500 mLs Intravenous New Bag/Given 03/05/22 2323)    ED Course/ Medical Decision Making/ A&P                           Medical Decision Making Amount and/or Complexity of Data Reviewed Labs: ordered.  Risk Prescription drug management.   Patient's work-up here complete metabolic panel significant for some hyponatremia at 129 which could be contributing to perhaps some of the weakness.  Potassium was good at 3.7 glucose 176 renal function GFR greater than 60 liver function tests normal.  Anion gap normal.  Magnesium normal.  Patient had troponins x2 without significant abnormalities.  CBC  mild leukocytosis with a white count of 13.2 hemoglobin 11.4.  Patient has lactic acid and blood cultures pending.  But based on vital signs does not really have any septic parameters but did have a fever to 100.5.  Urinalysis is pending and may be contributing to her symptoms.  COVID influenza negative.  Chest x-ray had no acute process.  After urinalysis patient will be ambulated.  Patient receiving IV fluids for the hyponatremia.   Final Clinical Impression(s) / ED Diagnoses Final diagnoses:  Weakness  Fever, unspecified fever cause  Hyponatremia    Rx / DC Orders ED Discharge Orders     None         Fredia Sorrow, MD 03/05/22 2346

## 2022-03-05 NOTE — ED Provider Triage Note (Signed)
Emergency Medicine Provider Triage Evaluation Note  Tami Jacobson , a 88 y.o. female  was evaluated in triage.  Patient complains of feeling very weak.  She says that she had a "nerve attack."  She said this happened this morning when she was shaking.  Everything is fine now.  Is been reports being concerned because patient is weak  Review of Systems  Positive:  Negative:   Physical Exam  BP (!) 177/52 (BP Location: Left Arm)   Pulse 72   Temp (!) 100.5 F (38.1 C) (Oral)   Resp 18   Ht '5\' 3"'$  (1.6 m)   Wt 54.9 kg   SpO2 93%   BMI 21.43 kg/m  Gen:   Awake, no distress   Resp:  Normal effort  MSK:   Moves extremities without difficulty  Other:  RRR, lung sounds clear  Medical Decision Making  Medically screening exam initiated at 7:09 PM.  Appropriate orders placed.  Tami Jacobson was informed that the remainder of the evaluation will be completed by another provider, this initial triage assessment does not replace that evaluation, and the importance of remaining in the ED until their evaluation is complete.    Febrile in triage with Tylenol ordered   Tami Hammock, PA-C 03/05/22 1912

## 2022-03-05 NOTE — Telephone Encounter (Signed)
Kristen from [facility], independent living facility called and said patients BP has been elevated, she's had trimmers, fatigue, diffifculty walking. No dizziness or anything. She asked her if she wanted to go to the hospital and she said no. She wants to no what Dr Quay Burow reccomends. Callback is [phone]

## 2022-03-05 NOTE — ED Triage Notes (Signed)
Per EMS- Patient is from [facility]. Patient's husband called EMS today and reported that the patient has had weakness prior to lunch today. Husband states that the  patient normally ambulates with a walker, but today she needed help to get off of the couch and could not walk alone.

## 2022-03-05 NOTE — ED Provider Notes (Signed)
  Provider Note MRN:  [id number]  Arrival date & time: 03/06/22    ED Course and Medical Decision Making  Assumed care from Dr. Rogene Houston at shift change.  Generalized weakness, fever, hyponatremia, suspect UTI.  Will reassess after fluids, UA.  3 AM update: Patient wakes easily and is fully conversant and feels well, would like to go home.  Has not spiked a fever since she first arrived.  Vital signs have been normal.  She does have a leukocytosis, urinalysis is not showing obvious infection.  She describes rigors that she experienced at home and so bacteremia is considered.  Cultures are pending.  She denies any cough or cold-like symptoms, she is negative for COVID, flu, RSV, she is not having any diarrhea or vomiting, no abdominal pain, no meningismus.  Unclear source of fever and so given her advanced age and her desire to go home and lack of indication for further testing or admission, we will provide dose of Rocephin here, weeklong course of Omnicef, strict return precautions.  Procedures  Final Clinical Impressions(s) / ED Diagnoses     ICD-10-CM   1. Weakness  R53.1     2. Fever, unspecified fever cause  R50.9     3. Hyponatremia  E87.1       ED Discharge Orders          Ordered    cefdinir (OMNICEF) 300 MG capsule  2 times daily        03/06/22 0315              Discharge Instructions      You were evaluated in the Emergency Department and after careful evaluation, we did not find any emergent condition requiring admission or further testing in the hospital.  Your exam/testing today was overall reassuring.  Symptoms may be due to a viral infection, however we recommend taking the antibiotics as directed to treat possible underlying bacterial infection.  Please return to the Emergency Department if you experience any worsening of your condition.  Thank you for allowing [place] to be a part of your care.       Barth Kirks. Sedonia Small, [place] [email or web address]    Maudie Flakes, MD 03/06/22 [phone]

## 2022-03-06 DIAGNOSIS — I10 Essential (primary) hypertension: Secondary | ICD-10-CM | POA: Diagnosis not present

## 2022-03-06 DIAGNOSIS — Z7401 Bed confinement status: Secondary | ICD-10-CM | POA: Diagnosis not present

## 2022-03-06 DIAGNOSIS — R531 Weakness: Secondary | ICD-10-CM | POA: Diagnosis not present

## 2022-03-06 LAB — URINALYSIS, ROUTINE W REFLEX MICROSCOPIC
Bilirubin Urine: NEGATIVE
Glucose, UA: NEGATIVE mg/dL
Hgb urine dipstick: NEGATIVE
Ketones, ur: NEGATIVE mg/dL
Leukocytes,Ua: NEGATIVE
Nitrite: NEGATIVE
Protein, ur: NEGATIVE mg/dL
Specific Gravity, Urine: <1.005 — ABNORMAL LOW (ref 1.005–1.030)
pH: 6 (ref 5.0–8.0)

## 2022-03-06 MED ORDER — CEFDINIR 300 MG PO CAPS: 300.0000 mg | ORAL_CAPSULE | Freq: Two times a day (BID) | ORAL | 0 refills | Status: AC

## 2022-03-06 MED ORDER — SODIUM CHLORIDE 0.9 % IV SOLN
1.0000 g | Freq: Once | INTRAVENOUS | Status: AC
  Administered 2022-03-06: 1 g via INTRAVENOUS
  Filled 2022-03-06: qty 10

## 2022-03-06 NOTE — ED Notes (Signed)
Pt ambulated to the bathroom using walker. Pt able to ambulate independently with walker for 145f.

## 2022-03-06 NOTE — Discharge Instructions (Signed)
You were evaluated in the Emergency Department and after careful evaluation, we did not find any emergent condition requiring admission or further testing in the hospital.  Your exam/testing today was overall reassuring.  Symptoms may be due to a viral infection, however we recommend taking the antibiotics as directed to treat possible underlying bacterial infection.  Please return to the Emergency Department if you experience any worsening of your condition.  Thank you for allowing [place] to be a part of your care.

## 2022-03-06 NOTE — ED Notes (Signed)
[facility] called transport set up for as soon as possible. Pt to return to [facility] green.

## 2022-03-07 LAB — URINE CULTURE: Culture: NO GROWTH

## 2022-03-08 DIAGNOSIS — Z9181 History of falling: Secondary | ICD-10-CM | POA: Diagnosis not present

## 2022-03-08 DIAGNOSIS — R2681 Unsteadiness on feet: Secondary | ICD-10-CM | POA: Diagnosis not present

## 2022-03-09 ENCOUNTER — Telehealth: Payer: Self-pay | Admitting: Hematology and Oncology

## 2022-03-09 NOTE — Telephone Encounter (Signed)
Contacted patient to scheduled appointments. Left message with appointment details and a call back number if patient had any questions or could not accommodate the time we provided.   

## 2022-03-10 DIAGNOSIS — R2681 Unsteadiness on feet: Secondary | ICD-10-CM | POA: Diagnosis not present

## 2022-03-10 DIAGNOSIS — M62552 Muscle wasting and atrophy, not elsewhere classified, left thigh: Secondary | ICD-10-CM | POA: Diagnosis not present

## 2022-03-10 DIAGNOSIS — R2689 Other abnormalities of gait and mobility: Secondary | ICD-10-CM | POA: Diagnosis not present

## 2022-03-10 DIAGNOSIS — M62551 Muscle wasting and atrophy, not elsewhere classified, right thigh: Secondary | ICD-10-CM | POA: Diagnosis not present

## 2022-03-10 DIAGNOSIS — Z9181 History of falling: Secondary | ICD-10-CM | POA: Diagnosis not present

## 2022-03-10 LAB — CULTURE, BLOOD (ROUTINE X 2)
Culture: NO GROWTH
Special Requests: ADEQUATE

## 2022-03-11 DIAGNOSIS — R2681 Unsteadiness on feet: Secondary | ICD-10-CM | POA: Diagnosis not present

## 2022-03-11 DIAGNOSIS — R2689 Other abnormalities of gait and mobility: Secondary | ICD-10-CM | POA: Diagnosis not present

## 2022-03-11 DIAGNOSIS — M62551 Muscle wasting and atrophy, not elsewhere classified, right thigh: Secondary | ICD-10-CM | POA: Diagnosis not present

## 2022-03-11 DIAGNOSIS — M62552 Muscle wasting and atrophy, not elsewhere classified, left thigh: Secondary | ICD-10-CM | POA: Diagnosis not present

## 2022-03-11 DIAGNOSIS — Z9181 History of falling: Secondary | ICD-10-CM | POA: Diagnosis not present

## 2022-03-15 DIAGNOSIS — Z9181 History of falling: Secondary | ICD-10-CM | POA: Diagnosis not present

## 2022-03-15 DIAGNOSIS — R2681 Unsteadiness on feet: Secondary | ICD-10-CM | POA: Diagnosis not present

## 2022-03-16 ENCOUNTER — Ambulatory Visit: Payer: Medicare Other | Admitting: Internal Medicine

## 2022-03-16 DIAGNOSIS — Z9181 History of falling: Secondary | ICD-10-CM | POA: Diagnosis not present

## 2022-03-16 DIAGNOSIS — R2689 Other abnormalities of gait and mobility: Secondary | ICD-10-CM | POA: Diagnosis not present

## 2022-03-16 DIAGNOSIS — M62551 Muscle wasting and atrophy, not elsewhere classified, right thigh: Secondary | ICD-10-CM | POA: Diagnosis not present

## 2022-03-16 DIAGNOSIS — R2681 Unsteadiness on feet: Secondary | ICD-10-CM | POA: Diagnosis not present

## 2022-03-16 DIAGNOSIS — M62552 Muscle wasting and atrophy, not elsewhere classified, left thigh: Secondary | ICD-10-CM | POA: Diagnosis not present

## 2022-03-17 DIAGNOSIS — M62551 Muscle wasting and atrophy, not elsewhere classified, right thigh: Secondary | ICD-10-CM | POA: Diagnosis not present

## 2022-03-17 DIAGNOSIS — Z9181 History of falling: Secondary | ICD-10-CM | POA: Diagnosis not present

## 2022-03-17 DIAGNOSIS — R2689 Other abnormalities of gait and mobility: Secondary | ICD-10-CM | POA: Diagnosis not present

## 2022-03-17 DIAGNOSIS — M62552 Muscle wasting and atrophy, not elsewhere classified, left thigh: Secondary | ICD-10-CM | POA: Diagnosis not present

## 2022-03-17 DIAGNOSIS — R2681 Unsteadiness on feet: Secondary | ICD-10-CM | POA: Diagnosis not present

## 2022-03-18 ENCOUNTER — Ambulatory Visit: Payer: Medicare Other | Admitting: Hematology and Oncology

## 2022-03-18 DIAGNOSIS — Z9181 History of falling: Secondary | ICD-10-CM | POA: Diagnosis not present

## 2022-03-18 DIAGNOSIS — M62552 Muscle wasting and atrophy, not elsewhere classified, left thigh: Secondary | ICD-10-CM | POA: Diagnosis not present

## 2022-03-18 DIAGNOSIS — R2689 Other abnormalities of gait and mobility: Secondary | ICD-10-CM | POA: Diagnosis not present

## 2022-03-18 DIAGNOSIS — M62551 Muscle wasting and atrophy, not elsewhere classified, right thigh: Secondary | ICD-10-CM | POA: Diagnosis not present

## 2022-03-18 DIAGNOSIS — R2681 Unsteadiness on feet: Secondary | ICD-10-CM | POA: Diagnosis not present

## 2022-03-22 ENCOUNTER — Telehealth: Payer: Self-pay | Admitting: Hematology and Oncology

## 2022-03-22 DIAGNOSIS — R2681 Unsteadiness on feet: Secondary | ICD-10-CM | POA: Diagnosis not present

## 2022-03-22 DIAGNOSIS — M62552 Muscle wasting and atrophy, not elsewhere classified, left thigh: Secondary | ICD-10-CM | POA: Diagnosis not present

## 2022-03-22 DIAGNOSIS — M62551 Muscle wasting and atrophy, not elsewhere classified, right thigh: Secondary | ICD-10-CM | POA: Diagnosis not present

## 2022-03-22 DIAGNOSIS — Z9181 History of falling: Secondary | ICD-10-CM | POA: Diagnosis not present

## 2022-03-22 DIAGNOSIS — R2689 Other abnormalities of gait and mobility: Secondary | ICD-10-CM | POA: Diagnosis not present

## 2022-03-22 NOTE — Telephone Encounter (Signed)
Patient returned phone call. Notified patient of upcoming appointments.

## 2022-03-24 ENCOUNTER — Encounter: Payer: Self-pay | Admitting: Family Medicine

## 2022-03-24 ENCOUNTER — Ambulatory Visit (INDEPENDENT_AMBULATORY_CARE_PROVIDER_SITE_OTHER): Payer: Medicare Other | Admitting: Family Medicine

## 2022-03-24 ENCOUNTER — Ambulatory Visit (INDEPENDENT_AMBULATORY_CARE_PROVIDER_SITE_OTHER): Payer: Medicare Other

## 2022-03-24 VITALS — BP 144/76 | HR 70 | Temp 97.5°F | Ht 63.0 in | Wt 118.0 lb

## 2022-03-24 DIAGNOSIS — J069 Acute upper respiratory infection, unspecified: Secondary | ICD-10-CM | POA: Diagnosis not present

## 2022-03-24 DIAGNOSIS — R5383 Other fatigue: Secondary | ICD-10-CM | POA: Diagnosis not present

## 2022-03-24 DIAGNOSIS — R058 Other specified cough: Secondary | ICD-10-CM

## 2022-03-24 DIAGNOSIS — R059 Cough, unspecified: Secondary | ICD-10-CM | POA: Diagnosis not present

## 2022-03-24 LAB — CBC WITH DIFFERENTIAL/PLATELET
Basophils Absolute: 0 10*3/uL (ref 0.0–0.1)
Basophils Relative: 0.4 % (ref 0.0–3.0)
Eosinophils Absolute: 0.1 10*3/uL (ref 0.0–0.7)
Eosinophils Relative: 1.6 % (ref 0.0–5.0)
HCT: 32.4 % — ABNORMAL LOW (ref 36.0–46.0)
Hemoglobin: 11.4 g/dL — ABNORMAL LOW (ref 12.0–15.0)
Lymphocytes Relative: 17.6 % (ref 12.0–46.0)
Lymphs Abs: 1.2 10*3/uL (ref 0.7–4.0)
MCHC: 35.2 g/dL (ref 30.0–36.0)
MCV: 94.2 fl (ref 78.0–100.0)
Monocytes Absolute: 0.7 10*3/uL (ref 0.1–1.0)
Monocytes Relative: 11 % (ref 3.0–12.0)
Neutro Abs: 4.7 10*3/uL (ref 1.4–7.7)
Neutrophils Relative %: 69.4 % (ref 43.0–77.0)
Platelets: 181 10*3/uL (ref 150.0–400.0)
RBC: 3.44 Mil/uL — ABNORMAL LOW (ref 3.87–5.11)
RDW: 13.8 % (ref 11.5–15.5)
WBC: 6.7 10*3/uL (ref 4.0–10.5)

## 2022-03-24 MED ORDER — DOXYCYCLINE HYCLATE 100 MG PO TABS: 100.0000 mg | ORAL_TABLET | Freq: Two times a day (BID) | ORAL | 0 refills | Status: DC

## 2022-03-24 MED ORDER — BENZONATATE 200 MG PO CAPS: 200.0000 mg | ORAL_CAPSULE | Freq: Two times a day (BID) | ORAL | 0 refills | Status: DC | PRN

## 2022-03-24 NOTE — Patient Instructions (Signed)
Please go downstairs for a chest X ray and blood work.    We will be in touch today with your results and recommendations.

## 2022-03-24 NOTE — Progress Notes (Signed)
Subjective:  Tami Jacobson is a 88 y.o. female who presents for 1 1/2 week hx of URI symptoms. She is now having a productive cough and worsening fatigue.   Denies fever, chills, dizziness, chest pain, palpitations, shortness of breath, abdominal pain, N/V/D, LE edema.   States her friend with the same symptoms is taking Amoxicillin and she would like to also take this if she needs an antibiotic.   She took a course of cefdinir the first week of December for possible infection after presenting to the ED for fever and generalized weakness.    Treatment to date: Cold and flu medication OTC.   + sick contacts.  No other aggravating or relieving factors.  No other c/o.  ROS as in subjective.   Objective: Vitals:   03/24/22 1328  BP: (!) 144/76  Pulse: 70  Temp: (!) 97.5 F (36.4 C)  SpO2: 98%    General appearance: Alert, WD/WN, no distress, mildly ill appearing                             Skin: warm, no rash                           Head: no sinus tenderness                            Eyes: conjunctiva normal, corneas clear, PERRLA                            Ears: pearly TMs, external ear canals normal                          Nose: septum midline, turbinates swollen, with erythema and clear discharge             Mouth/throat: MMM, tongue normal, mild pharyngeal erythema                           Neck: supple, no adenopathy, no thyromegaly, nontender                          Heart: RRR                         Lungs: CTA bilaterally, no wheezes, rales, or rhonchi      Assessment: Productive cough - Plan: CBC with Differential/Platelet, DG Chest 2 View, benzonatate (TESSALON) 200 MG capsule, doxycycline (VIBRA-TABS) 100 MG tablet  Upper respiratory tract infection, unspecified type   Plan: Stat CXR and CBC ordered.  Doxycycline and Tessalon prescribed.  May continue OTC cold and cough medication.   Tylenol OTC prn.       Follow up if worsening or not back to  baseline when completing the antibiotic.

## 2022-03-24 NOTE — Progress Notes (Signed)
Her labs are fine. Normal WBC count. No change to the treatment plan we discussed.

## 2022-03-24 NOTE — Progress Notes (Signed)
Please let her know that her chest x-ray does not show any acute changes.  No sign of pneumonia.  I did prescribe an antibiotic called doxycycline as well as Tessalon Perles for cough.  She should let us know if she is getting any worse or not back to baseline when she completes the antibiotic.

## 2022-03-26 DIAGNOSIS — R2681 Unsteadiness on feet: Secondary | ICD-10-CM | POA: Diagnosis not present

## 2022-03-26 DIAGNOSIS — Z9181 History of falling: Secondary | ICD-10-CM | POA: Diagnosis not present

## 2022-03-30 DIAGNOSIS — R2689 Other abnormalities of gait and mobility: Secondary | ICD-10-CM | POA: Diagnosis not present

## 2022-03-30 DIAGNOSIS — Z9181 History of falling: Secondary | ICD-10-CM | POA: Diagnosis not present

## 2022-03-30 DIAGNOSIS — R2681 Unsteadiness on feet: Secondary | ICD-10-CM | POA: Diagnosis not present

## 2022-03-30 DIAGNOSIS — M62551 Muscle wasting and atrophy, not elsewhere classified, right thigh: Secondary | ICD-10-CM | POA: Diagnosis not present

## 2022-03-30 DIAGNOSIS — M62552 Muscle wasting and atrophy, not elsewhere classified, left thigh: Secondary | ICD-10-CM | POA: Diagnosis not present

## 2022-03-31 DIAGNOSIS — R2681 Unsteadiness on feet: Secondary | ICD-10-CM | POA: Diagnosis not present

## 2022-03-31 DIAGNOSIS — M62552 Muscle wasting and atrophy, not elsewhere classified, left thigh: Secondary | ICD-10-CM | POA: Diagnosis not present

## 2022-03-31 DIAGNOSIS — M62551 Muscle wasting and atrophy, not elsewhere classified, right thigh: Secondary | ICD-10-CM | POA: Diagnosis not present

## 2022-03-31 DIAGNOSIS — R2689 Other abnormalities of gait and mobility: Secondary | ICD-10-CM | POA: Diagnosis not present

## 2022-03-31 DIAGNOSIS — Z9181 History of falling: Secondary | ICD-10-CM | POA: Diagnosis not present

## 2022-04-06 DIAGNOSIS — R2681 Unsteadiness on feet: Secondary | ICD-10-CM | POA: Diagnosis not present

## 2022-04-06 DIAGNOSIS — Z9181 History of falling: Secondary | ICD-10-CM | POA: Diagnosis not present

## 2022-04-06 DIAGNOSIS — M62552 Muscle wasting and atrophy, not elsewhere classified, left thigh: Secondary | ICD-10-CM | POA: Diagnosis not present

## 2022-04-06 DIAGNOSIS — M62551 Muscle wasting and atrophy, not elsewhere classified, right thigh: Secondary | ICD-10-CM | POA: Diagnosis not present

## 2022-04-06 DIAGNOSIS — R2689 Other abnormalities of gait and mobility: Secondary | ICD-10-CM | POA: Diagnosis not present

## 2022-04-12 DIAGNOSIS — R2689 Other abnormalities of gait and mobility: Secondary | ICD-10-CM | POA: Diagnosis not present

## 2022-04-12 DIAGNOSIS — M62551 Muscle wasting and atrophy, not elsewhere classified, right thigh: Secondary | ICD-10-CM | POA: Diagnosis not present

## 2022-04-12 DIAGNOSIS — R2681 Unsteadiness on feet: Secondary | ICD-10-CM | POA: Diagnosis not present

## 2022-04-12 DIAGNOSIS — Z9181 History of falling: Secondary | ICD-10-CM | POA: Diagnosis not present

## 2022-04-12 DIAGNOSIS — M62552 Muscle wasting and atrophy, not elsewhere classified, left thigh: Secondary | ICD-10-CM | POA: Diagnosis not present

## 2022-04-13 DIAGNOSIS — R2689 Other abnormalities of gait and mobility: Secondary | ICD-10-CM | POA: Diagnosis not present

## 2022-04-13 DIAGNOSIS — Z9181 History of falling: Secondary | ICD-10-CM | POA: Diagnosis not present

## 2022-04-13 DIAGNOSIS — M62552 Muscle wasting and atrophy, not elsewhere classified, left thigh: Secondary | ICD-10-CM | POA: Diagnosis not present

## 2022-04-13 DIAGNOSIS — R2681 Unsteadiness on feet: Secondary | ICD-10-CM | POA: Diagnosis not present

## 2022-04-13 DIAGNOSIS — M62551 Muscle wasting and atrophy, not elsewhere classified, right thigh: Secondary | ICD-10-CM | POA: Diagnosis not present

## 2022-04-14 ENCOUNTER — Inpatient Hospital Stay: Payer: Medicare Other | Attending: Hematology and Oncology | Admitting: Hematology and Oncology

## 2022-04-14 ENCOUNTER — Other Ambulatory Visit: Payer: Self-pay

## 2022-04-14 VITALS — BP 145/59 | HR 76 | Temp 97.9°F | Resp 14 | Ht 63.0 in | Wt 114.8 lb

## 2022-04-14 DIAGNOSIS — Z170 Estrogen receptor positive status [ER+]: Secondary | ICD-10-CM | POA: Diagnosis not present

## 2022-04-14 DIAGNOSIS — Z7984 Long term (current) use of oral hypoglycemic drugs: Secondary | ICD-10-CM | POA: Diagnosis not present

## 2022-04-14 DIAGNOSIS — Z923 Personal history of irradiation: Secondary | ICD-10-CM | POA: Insufficient documentation

## 2022-04-14 DIAGNOSIS — C50311 Malignant neoplasm of lower-inner quadrant of right female breast: Secondary | ICD-10-CM | POA: Diagnosis not present

## 2022-04-14 DIAGNOSIS — Z853 Personal history of malignant neoplasm of breast: Secondary | ICD-10-CM

## 2022-04-14 DIAGNOSIS — E119 Type 2 diabetes mellitus without complications: Secondary | ICD-10-CM | POA: Diagnosis not present

## 2022-04-14 DIAGNOSIS — Z79810 Long term (current) use of selective estrogen receptor modulators (SERMs): Secondary | ICD-10-CM | POA: Insufficient documentation

## 2022-04-14 DIAGNOSIS — Z79899 Other long term (current) drug therapy: Secondary | ICD-10-CM | POA: Insufficient documentation

## 2022-04-14 NOTE — Progress Notes (Signed)
[place]  [phone] Fax:([phone]) [phone]     ID: Tami Jacobson DOB: [birth year]  MR#: [id number]  HBZ#:[id number]  Patient Care Team: Binnie Rail, MD as PCP - General (Internal Medicine) Stanford Breed Denice Bors, MD as Consulting Physician (Cardiology) Magrinat, Virgie Dad, MD (Inactive) as Consulting Physician (Hematology and Oncology) Newton Pigg, MD as Consulting Physician (Obstetrics and Gynecology) Alphonsa Overall, MD as Consulting Physician (General Surgery) Rozetta Nunnery, MD (Inactive) as Consulting Physician (Otolaryngology) Kyung Rudd, MD as Consulting Physician (Radiation Oncology) Delice Bison, Charlestine Massed, NP as Nurse Practitioner (Hematology and Oncology) Luberta Mutter, MD as Consulting Physician (Ophthalmology) Charlton Haws, [facility] as Pharmacist (Pharmacist) OTHER MD:   CHIEF COMPLAINT: Estrogen receptor positive breast cancer  CURRENT TREATMENT:  tamoxifen   INTERVAL HISTORY: Tami Jacobson returns today for follow-up of her estrogen receptor positive breast cancer.   She continues on tamoxifen.  She tells me that she has no side effects with the medication, Last mammogram July 2023 with no concerns for malignancy. Rest of the pertinent 10 point ROS reviewed and negative.    COVID 19 VACCINATION STATUS: Moderna x3, most recently 03/2020   BREAST CANCER HISTORY: From the recent update summary:  The patient had bilateral screening mammography with tomography at the [facility] 06/28/2016 showing only post surgical changes in both breasts. The patient had a history of right lateral breast cancer diagnosed in 2010, T1 cN0, status post lumpectomy March of that year, and she also has a history of T1 CN 0 left breast cancer status post lumpectomy in 2008. She had excisional biopsy on the left of what proved to be atypical lobular hyperplasia March 2013.  On 09/23/2016 the patient was evaluated for pain in the inferior right  breast and a feeling of lumpiness. Right [facility] found the breast density to be category C. In the right breast there was skin thickening around the areola. This appeared unchanged. On tomography however there was a suggestion of a small spiculated mass in the inferior central right breast measuring 0.5 cm. This was palpable at the 5:00 position 2 cm from the nipple. Targeted ultrasonography confirmed an irregular mass in this position measuring 0.8 cm. Ultrasound of the right axilla was negative.  Biopsy of this right breast lower inner quadrant mass 09/27/2016 showed ([id number]) invasive lobular carcinoma, E-cadherin negative, grade not stated, estrogen receptor 80% positive, progesterone receptor 5% positive, both with strong staining intensity, with an MIB-1 of 5%, and no HER-2 amplification, the signals ratio being 1.21 and the number per cell 2.30.  The patient's subsequent history is as detailed below.   PAST MEDICAL HISTORY: Past Medical History:  Diagnosis Date   Arthritis    left hip   Breast cancer, right ([place]) 11/2016   Family history of breast cancer    Family history of colon cancer    Family history of pancreatic cancer    GERD (gastroesophageal reflux disease)    Non-insulin dependent type 2 diabetes mellitus ([place])    PAC (premature atrial contraction)    Personal history of radiation therapy 12/2016   Premature ventricular contraction    Seasonal allergies    Sensitive skin    Vitamin B12 deficiency     PAST SURGICAL HISTORY: Past Surgical History:  Procedure Laterality Date   ABDOMINAL HYSTERECTOMY  1979   partial   BREAST BIOPSY  07/02/2011   Procedure: BREAST BIOPSY WITH NEEDLE LOCALIZATION;  Surgeon: Shann Medal, MD;  Location: [facility] OR;  Service: General;  Laterality: Left;  left breast atypical hyperplasia needle localization biopsy   BREAST BIOPSY Left 06/13/2006   BREAST BIOPSY Right 06/27/2008    BREAST BIOPSY Right 09/27/2016   BREAST EXCISIONAL BIOPSY Left 07/02/2011   BREAST LUMPECTOMY Left 07/04/2006   BREAST LUMPECTOMY Right 06/26/2008   BREAST LUMPECTOMY Right 09/27/2016   BREAST LUMPECTOMY WITH RADIOACTIVE SEED LOCALIZATION Right 11/18/2016   Procedure: RIGHT BREAST LUMPECTOMY WITH RADIOACTIVE SEED LOCALIZATION ERAS PATHWAY;  Surgeon: Alphonsa Overall, MD;  Location: [facility];  Service: General;  Laterality: Right;  ERAS PATHWAY   CATARACT EXTRACTION Bilateral    CHOLECYSTECTOMY, LAPAROSCOPIC  07/07/2001   had appendex taking out at the same time   ESOPHAGEAL DILATION  2006   LAPAROSCOPIC APPENDECTOMY  07/07/2001   TONSILLECTOMY  1942    FAMILY HISTORY Family History  Problem Relation Age of Onset   Colon cancer Mother 74   Heart attack Mother    Breast cancer Sister 66   Heart disease Maternal Uncle        CABG   Breast cancer Sister 14   Heart disease Brother    Other Father        suicide   Breast cancer Sister 2   Heart disease Sister    COPD Brother    Heart disease Brother    Breast cancer Other 10       niece - brother's daughter   Pancreatic cancer Other        brother's son, dx in his 54s  The patient's father committed suicide at the age of 42. The patient's mother had a history of colon cancer diagnosed at age 79, she died at the age of 48 from unrelated causes. The patient is 30 sisters. 2 sisters died from breast cancer, 1 diagnosed age 64 diagnosed age 13. A third sister was diagnosed with breast cancer at age 99. The patient has 2 brothers. There is no other history of cancer in the family to the patient's knowledge.   GYNECOLOGIC HISTORY:  GX P1, first live birth age 88. The patient took Premarin alone for nearly 30 years, status post hysterectomy. No LMP recorded. Patient has had a hysterectomy.   SOCIAL HISTORY:  She worked as a Network engineer for [employer]. Her husband of more than 65 year, Marcello Moores, "just left me and  moved to an assisted living facility.". Their divorce was finalized August 2018. The patient tells me that her daughter Margarita Grizzle is estranged. The patient lives by herself, with no pets. [place] in [place]    ADVANCED DIRECTIVES: Not in place. At the 10/22/2019 visit the patient tells me she has named her friend Tawnya Crook as her healthcare power of attorney.  We have her phone number on file   HEALTH MAINTENANCE: Social History   Tobacco Use   Smoking status: Never   Smokeless tobacco: Never  Vaping Use   Vaping Use: Never used  Substance Use Topics   Alcohol use: No    Alcohol/week: 0.0 standard drinks of alcohol   Drug use: No     Allergies  Allergen Reactions   Statins Other (See Comments)    GI UPSET   Effexor [Venlafaxine]     Constipation, shaking, dizzy   Gabapentin     drowsniess   Cephalexin Other (See Comments)    Reaction not recalled by the patient   Codeine Other (See Comments)    Reaction not  recalled by the patient   Lansoprazole Other (See Comments)    Reaction not recalled by the patient   Latex Rash        Levofloxacin Other (See Comments)    Reaction not recalled by the patient   Nitrofurantoin Other (See Comments)    Reaction not recalled by the patient   Rofecoxib Other (See Comments)    Reaction not recalled by the patient     Current Outpatient Medications  Medication Sig Dispense Refill   acetaminophen (TYLENOL 8 HOUR) 650 MG CR tablet Take 2 tablets every 8 hours by oral route.     amLODipine (NORVASC) 2.5 MG tablet Take 1 tablet (2.5 mg total) by mouth daily. 90 tablet 1   benzonatate (TESSALON) 200 MG capsule Take 1 capsule (200 mg total) by mouth 2 (two) times daily as needed for cough. 20 capsule 0   clobetasol cream (TEMOVATE) 0.05 % APPLY CREAM TOPICALLY TWICE DAILY AS NEEDED FOR IRRITATION FROM LICHEN SCLEROSUS     cyanocobalamin (,VITAMIN B-12,) 1000 MCG/ML injection Inject 1,000 mcg into the  muscle every 30 (thirty) days.     doxycycline (VIBRA-TABS) 100 MG tablet Take 1 tablet (100 mg total) by mouth 2 (two) times daily. 20 tablet 0   flecainide (TAMBOCOR) 50 MG tablet TAKE 1 TABLET BY MOUTH  TWICE DAILY 180 tablet 3   losartan (COZAAR) 100 MG tablet Take 1 tablet (100 mg total) by mouth daily. 90 tablet 1   metFORMIN (GLUCOPHAGE-XR) 500 MG 24 hr tablet TAKE 1 TABLET BY MOUTH ONCE  DAILY WITH BREAKFAST 100 tablet 2   metoprolol succinate (TOPROL-XL) 50 MG 24 hr tablet TAKE 1 AND 1/2 TABLETS BY  MOUTH DAILY . TAKE WITH OR  IMMEDIATELY FOLLOWING A  MEAL 135 tablet 3   omeprazole (PRILOSEC) 40 MG capsule Take 40 mg by mouth daily.     No current facility-administered medications for this visit.    OBJECTIVE:  white woman who appears frail  Vitals:   04/14/22 1312  BP: (!) 145/59  Pulse: 76  Resp: 14  Temp: 97.9 F (36.6 C)  SpO2: 98%       Body mass index is 20.34 kg/m.   Wt Readings from Last 3 Encounters:  04/14/22 114 lb 12.8 oz (52.1 kg)  03/24/22 118 lb (53.5 kg)  03/05/22 121 lb (54.9 kg)      ECOG FS:1 - Symptomatic but completely ambulatory  Sclerae unicteric, EOMs intact Wearing a mask No cervical or supraclavicular adenopathy MSK no focal spinal tenderness, no upper extremity lymphedema Neuro: nonfocal, well oriented, appropriate affect Breasts: The right breast has undergone lumpectomy and radiation, with a chronically inverted nipple.  No concerning changes in right breast. No regional adenopathy. Left breast normal to inspection and palpation.  LAB RESULTS:  CMP     Component Value Date/Time   NA 129 (L) 03/05/2022 1819   NA 136 02/28/2017 1453   K 3.7 03/05/2022 1819   K 5.1 02/28/2017 1453   CL 96 (L) 03/05/2022 1819   CL 106 07/11/2012 0901   CO2 26 03/05/2022 1819   CO2 26 02/28/2017 1453   GLUCOSE 176 (H) 03/05/2022 1819   GLUCOSE 179 (H) 02/28/2017 1453   GLUCOSE 110 (H) 07/11/2012 0901   BUN 12 03/05/2022 1819   BUN 18.2  02/28/2017 1453   CREATININE 0.74 03/05/2022 1819   CREATININE 1.19 (H) 11/04/2020 1036   CREATININE 1.20 (H) 11/16/2019 1302   CREATININE 1.1 02/28/2017 1453  CALCIUM 8.7 (L) 03/05/2022 1819   CALCIUM 10.1 02/28/2017 1453   PROT 7.1 03/05/2022 1819   PROT 7.6 02/28/2017 1453   ALBUMIN 3.7 03/05/2022 1819   ALBUMIN 3.9 02/28/2017 1453   AST 24 03/05/2022 1819   AST 17 11/04/2020 1036   AST 12 02/28/2017 1453   ALT 13 03/05/2022 1819   ALT 14 11/04/2020 1036   ALT 10 02/28/2017 1453   ALKPHOS 46 03/05/2022 1819   ALKPHOS 84 02/28/2017 1453   BILITOT 1.2 03/05/2022 1819   BILITOT 0.7 11/04/2020 1036   BILITOT 0.70 02/28/2017 1453   GFRNONAA >60 03/05/2022 1819   GFRNONAA 43 (L) 11/04/2020 1036   GFRAA 46 (L) 10/22/2019 1307    No results found for: "TOTALPROTELP", "ALBUMINELP", "A1GS", "A2GS", "BETS", "BETA2SER", "GAMS", "MSPIKE", "SPEI"  No results found for: "KPAFRELGTCHN", "LAMBDASER", "KAPLAMBRATIO"  Lab Results  Component Value Date   WBC 6.7 03/24/2022   NEUTROABS 4.7 03/24/2022   HGB 11.4 (L) 03/24/2022   HCT 32.4 (L) 03/24/2022   MCV 94.2 03/24/2022   PLT 181.0 03/24/2022      Chemistry      Component Value Date/Time   NA 129 (L) 03/05/2022 1819   NA 136 02/28/2017 1453   K 3.7 03/05/2022 1819   K 5.1 02/28/2017 1453   CL 96 (L) 03/05/2022 1819   CL 106 07/11/2012 0901   CO2 26 03/05/2022 1819   CO2 26 02/28/2017 1453   BUN 12 03/05/2022 1819   BUN 18.2 02/28/2017 1453   CREATININE 0.74 03/05/2022 1819   CREATININE 1.19 (H) 11/04/2020 1036   CREATININE 1.20 (H) 11/16/2019 1302   CREATININE 1.1 02/28/2017 1453      Component Value Date/Time   CALCIUM 8.7 (L) 03/05/2022 1819   CALCIUM 10.1 02/28/2017 1453   ALKPHOS 46 03/05/2022 1819   ALKPHOS 84 02/28/2017 1453   AST 24 03/05/2022 1819   AST 17 11/04/2020 1036   AST 12 02/28/2017 1453   ALT 13 03/05/2022 1819   ALT 14 11/04/2020 1036   ALT 10 02/28/2017 1453   BILITOT 1.2 03/05/2022 1819    BILITOT 0.7 11/04/2020 1036   BILITOT 0.70 02/28/2017 1453       Lab Results  Component Value Date   LABCA2 18 10/07/2009    No components found for: "BRAXEN407"  No results for input(s): "INR" in the last 168 hours.  Urinalysis    Component Value Date/Time   COLORURINE YELLOW (A) 03/06/2022 0105   APPEARANCEUR CLEAR (A) 03/06/2022 0105   LABSPEC <1.005 (L) 03/06/2022 0105   PHURINE 6.0 03/06/2022 0105   GLUCOSEU NEGATIVE 03/06/2022 0105   HGBUR NEGATIVE 03/06/2022 0105   BILIRUBINUR NEGATIVE 03/06/2022 0105   KETONESUR NEGATIVE 03/06/2022 0105   PROTEINUR NEGATIVE 03/06/2022 0105   NITRITE NEGATIVE 03/06/2022 0105   LEUKOCYTESUR NEGATIVE 03/06/2022 0105    STUDIES: DG Chest 2 View  Result Date: 03/24/2022 CLINICAL DATA:  Productive cough and fatigue for 10 days. History of breast cancer. EXAM: CHEST - 2 VIEW COMPARISON:  Chest radiographs 03/05/2022 and chest CT 02/14/2020 FINDINGS: The cardiomediastinal silhouette is unchanged with normal heart size. Aortic atherosclerosis is noted. Biapical pleuroparenchymal lung scarring, right greater than left, is unchanged with mild nodularity again noted on the right. No acute airspace consolidation, edema, pleural effusion, or pneumothorax is identified. No acute osseous abnormality is seen. IMPRESSION: No active cardiopulmonary disease. Electronically Signed   By: Logan Bores M.D.   On: 03/24/2022 14:06     ELIGIBLE  FOR AVAILABLE RESEARCH PROTOCOL: no  ASSESSMENT: 89 y.o. [place] woman   (1) status post left lumpectomy March 2008 for a pT1c invasive ductal carcinoma, grade 1, strongly estrogen and progesterone receptor positive, HER-2 not amplified, with a borderline MIB-1  (a) received tamoxifen, but very intermittently  (2) status post right lumpectomy and sentinel lymph node sampling 06/28/2008 for a pT1c pN0, stage IA invasive ductal carcinoma, grade 1, estrogen receptor 96% positive, progesterone receptor negative,  HER-2/neu negative, with an MIB-1 of 14%  (a) received anastrozole April 2010 to April 2013  (3) status post excision of an area of lobular carcinoma in situ, left breast, 07/02/2011  (a) on tamoxifen April 2013 to April 2015.  (4) status post biopsy of the right breast lower inner quadrant 09/27/2016 for a clinical T1a N0 invasive lobular carcinoma, E-cadherin negative, estrogen and progesterone receptor positive, with no HER-2 amplification and an MIB-1 of 5%.  (5) right lumpectomy without sentinel lymph node sampling 11/18/2016 found a pT1b cN0, stage IA invasive lobular breast cancer, grade 2, with negative margins  (6) adjuvant radiation 12/21/2016 - 01/17/2017  Site/dose:   Right breast/ 42.5 Gy in 17 fractions                    Boost: 7.5 Gy in 3 fractions  (7) tamoxifen started 03/05/2017  (8) offered genetics testing at meeting with genetics counselor 11/15/2016, but declined   PLAN:  She completed 5 yrs of Tamoxifen and she can at this point discontinue anti estrogens. Mammogram July 2023 with no findings suspicious for malignancy. No concerns on Physical exam. Chronically inverted right nipple, no other changes concerning for recurrence. She at this point can return to clinic once a yr or as needed.  Total time spent: 20 min *Total Encounter Time as defined by the Centers for Medicare and Medicaid Services includes, in addition to the face-to-face time of a patient visit (documented in the note above) non-face-to-face time: obtaining and reviewing outside history, ordering and reviewing medications, tests or procedures, care coordination (communications with other health care professionals or caregivers) and documentation in the medical record.

## 2022-04-15 ENCOUNTER — Other Ambulatory Visit: Payer: Self-pay | Admitting: Internal Medicine

## 2022-04-15 DIAGNOSIS — R2681 Unsteadiness on feet: Secondary | ICD-10-CM | POA: Diagnosis not present

## 2022-04-15 DIAGNOSIS — Z9181 History of falling: Secondary | ICD-10-CM | POA: Diagnosis not present

## 2022-04-16 ENCOUNTER — Other Ambulatory Visit: Payer: Self-pay | Admitting: Cardiology

## 2022-04-16 ENCOUNTER — Other Ambulatory Visit: Payer: Self-pay | Admitting: Internal Medicine

## 2022-04-16 ENCOUNTER — Other Ambulatory Visit: Payer: Self-pay

## 2022-04-16 DIAGNOSIS — R002 Palpitations: Secondary | ICD-10-CM

## 2022-04-18 NOTE — Progress Notes (Deleted)
    Subjective:    Patient ID: Tami Jacobson, female    DOB: December 10, 1927, 89 y.o.   MRN: [id number]      HPI Tami Jacobson is here for No chief complaint on file.   Congestion -      Medications and allergies reviewed with patient and updated if appropriate.  Current Outpatient Medications on File Prior to Visit  Medication Sig Dispense Refill   acetaminophen (TYLENOL 8 HOUR) 650 MG CR tablet Take 2 tablets every 8 hours by oral route.     amLODipine (NORVASC) 2.5 MG tablet Take 1 tablet (2.5 mg total) by mouth daily. 90 tablet 1   benzonatate (TESSALON) 200 MG capsule Take 1 capsule (200 mg total) by mouth 2 (two) times daily as needed for cough. 20 capsule 0   clobetasol cream (TEMOVATE) 0.05 % APPLY CREAM TOPICALLY TWICE DAILY AS NEEDED FOR IRRITATION FROM LICHEN SCLEROSUS     cyanocobalamin (,VITAMIN B-12,) 1000 MCG/ML injection Inject 1,000 mcg into the muscle every 30 (thirty) days.     doxycycline (VIBRA-TABS) 100 MG tablet Take 1 tablet (100 mg total) by mouth 2 (two) times daily. 20 tablet 0   flecainide (TAMBOCOR) 50 MG tablet TAKE 1 TABLET BY MOUTH  TWICE DAILY 180 tablet 3   losartan (COZAAR) 100 MG tablet Take 1 tablet (100 mg total) by mouth daily. 90 tablet 1   metFORMIN (GLUCOPHAGE-XR) 500 MG 24 hr tablet TAKE 1 TABLET BY MOUTH ONCE  DAILY WITH BREAKFAST 100 tablet 2   metoprolol succinate (TOPROL-XL) 50 MG 24 hr tablet Take 1 tablet (50 mg total) by mouth daily. 150 tablet 0   omeprazole (PRILOSEC) 40 MG capsule TAKE 1 CAPSULE BY MOUTH DAILY 100 capsule 2   No current facility-administered medications on file prior to visit.    Review of Systems     Objective:  There were no vitals filed for this visit. BP Readings from Last 3 Encounters:  04/14/22 (!) 145/59  03/24/22 (!) 144/76  03/06/22 (!) 162/49   Wt Readings from Last 3 Encounters:  04/14/22 114 lb 12.8 oz (52.1 kg)  03/24/22 118 lb (53.5 kg)  03/05/22 121 lb (54.9 kg)   There is no height or  weight on file to calculate BMI.    Physical Exam         Assessment & Plan:    See Problem List for Assessment and Plan of chronic medical problems.

## 2022-04-19 DIAGNOSIS — R2681 Unsteadiness on feet: Secondary | ICD-10-CM | POA: Diagnosis not present

## 2022-04-19 DIAGNOSIS — Z9181 History of falling: Secondary | ICD-10-CM | POA: Diagnosis not present

## 2022-04-19 DIAGNOSIS — R2689 Other abnormalities of gait and mobility: Secondary | ICD-10-CM | POA: Diagnosis not present

## 2022-04-19 DIAGNOSIS — M62551 Muscle wasting and atrophy, not elsewhere classified, right thigh: Secondary | ICD-10-CM | POA: Diagnosis not present

## 2022-04-19 DIAGNOSIS — M62552 Muscle wasting and atrophy, not elsewhere classified, left thigh: Secondary | ICD-10-CM | POA: Diagnosis not present

## 2022-04-20 ENCOUNTER — Ambulatory Visit: Payer: Medicare Other | Admitting: Internal Medicine

## 2022-04-21 DIAGNOSIS — R2681 Unsteadiness on feet: Secondary | ICD-10-CM | POA: Diagnosis not present

## 2022-04-21 DIAGNOSIS — Z9181 History of falling: Secondary | ICD-10-CM | POA: Diagnosis not present

## 2022-04-22 ENCOUNTER — Ambulatory Visit (INDEPENDENT_AMBULATORY_CARE_PROVIDER_SITE_OTHER): Payer: Medicare Other | Admitting: Family Medicine

## 2022-04-22 ENCOUNTER — Encounter: Payer: Self-pay | Admitting: Family Medicine

## 2022-04-22 VITALS — BP 138/84 | HR 70 | Temp 97.6°F | Ht 63.0 in | Wt 113.0 lb

## 2022-04-22 DIAGNOSIS — R051 Acute cough: Secondary | ICD-10-CM | POA: Diagnosis not present

## 2022-04-22 DIAGNOSIS — E538 Deficiency of other specified B group vitamins: Secondary | ICD-10-CM

## 2022-04-22 DIAGNOSIS — R634 Abnormal weight loss: Secondary | ICD-10-CM | POA: Diagnosis not present

## 2022-04-22 MED ORDER — CYANOCOBALAMIN 1000 MCG/ML IJ SOLN
1000.0000 ug | Freq: Once | INTRAMUSCULAR | Status: AC
  Administered 2022-04-22: 1000 ug via INTRAMUSCULAR

## 2022-04-22 NOTE — Progress Notes (Signed)
Subjective:  Tami Jacobson is a 89 y.o. female who presents for cough and chest congestion last week and earlier this week. Improving. Back to baseline today.   She took Mucinex and her cough loosened up.   Denies fever, chills, dizziness, chest pain, palpitations, shortness of breath, abdominal pain, N/V/D, urinary symptoms, LE edema.   Concerned about losing weight. Appetite is fairly good.  Eats a good breakfast.   Requests B12 injection.   Lives at [facility] assisted living. States the food has not been good as usual. She is out of Glucerna.    No other aggravating or relieving factors.  No other c/o.  ROS as in subjective.   Objective: Vitals:   04/22/22 1336  BP: 138/84  Pulse: 70  Temp: 97.6 F (36.4 C)  SpO2: 98%    General appearance: Alert, WD/WN, no distress, mildly ill appearing                             Skin: warm, no rash                           Head: no sinus tenderness                            Eyes: conjunctiva normal, corneas clear, PERRLA                            Ears: pearly TMs, external ear canals normal                          Nose: septum midline, no discharge             Mouth/throat: MMM, tongue normal                           Neck: supple, no adenopathy                          Heart: RRR                         Lungs: CTA bilaterally, no wheezes, rales, or rhonchi      Assessment: Acute cough  Weight loss  B12 deficiency - Plan: cyanocobalamin (VITAMIN B12) injection 1,000 mcg   Plan: Here today for recent cough and chest congestion. Symptoms have improved since making the appt. Feels back to baseline.  Recent weight loss and states the food has not been as good at her assisted living facility. She has been out of Glucerna, she will get more.   B12 injection given today.  Follow up with PCP as scheduled next month.

## 2022-04-22 NOTE — Patient Instructions (Addendum)
I am glad that you are feeling better.   Stay hydrated and take Mucinex as needed for chest congestion.    Start back on Glucerna drinks for protein supplement and weight gain.   Follow up with Dr. Quay Burow next month as scheduled.

## 2022-04-26 DIAGNOSIS — M62552 Muscle wasting and atrophy, not elsewhere classified, left thigh: Secondary | ICD-10-CM | POA: Diagnosis not present

## 2022-04-26 DIAGNOSIS — R2689 Other abnormalities of gait and mobility: Secondary | ICD-10-CM | POA: Diagnosis not present

## 2022-04-26 DIAGNOSIS — R2681 Unsteadiness on feet: Secondary | ICD-10-CM | POA: Diagnosis not present

## 2022-04-26 DIAGNOSIS — Z9181 History of falling: Secondary | ICD-10-CM | POA: Diagnosis not present

## 2022-04-26 DIAGNOSIS — M62551 Muscle wasting and atrophy, not elsewhere classified, right thigh: Secondary | ICD-10-CM | POA: Diagnosis not present

## 2022-04-27 DIAGNOSIS — R2681 Unsteadiness on feet: Secondary | ICD-10-CM | POA: Diagnosis not present

## 2022-04-27 DIAGNOSIS — Z9181 History of falling: Secondary | ICD-10-CM | POA: Diagnosis not present

## 2022-04-28 DIAGNOSIS — M62552 Muscle wasting and atrophy, not elsewhere classified, left thigh: Secondary | ICD-10-CM | POA: Diagnosis not present

## 2022-04-28 DIAGNOSIS — Z9181 History of falling: Secondary | ICD-10-CM | POA: Diagnosis not present

## 2022-04-28 DIAGNOSIS — R2689 Other abnormalities of gait and mobility: Secondary | ICD-10-CM | POA: Diagnosis not present

## 2022-04-28 DIAGNOSIS — M62551 Muscle wasting and atrophy, not elsewhere classified, right thigh: Secondary | ICD-10-CM | POA: Diagnosis not present

## 2022-04-28 DIAGNOSIS — R2681 Unsteadiness on feet: Secondary | ICD-10-CM | POA: Diagnosis not present

## 2022-04-29 DIAGNOSIS — R2681 Unsteadiness on feet: Secondary | ICD-10-CM | POA: Diagnosis not present

## 2022-04-29 DIAGNOSIS — M62551 Muscle wasting and atrophy, not elsewhere classified, right thigh: Secondary | ICD-10-CM | POA: Diagnosis not present

## 2022-04-29 DIAGNOSIS — Z9181 History of falling: Secondary | ICD-10-CM | POA: Diagnosis not present

## 2022-04-29 DIAGNOSIS — R2689 Other abnormalities of gait and mobility: Secondary | ICD-10-CM | POA: Diagnosis not present

## 2022-04-29 DIAGNOSIS — M62552 Muscle wasting and atrophy, not elsewhere classified, left thigh: Secondary | ICD-10-CM | POA: Diagnosis not present

## 2022-04-30 DIAGNOSIS — R2681 Unsteadiness on feet: Secondary | ICD-10-CM | POA: Diagnosis not present

## 2022-04-30 DIAGNOSIS — R2689 Other abnormalities of gait and mobility: Secondary | ICD-10-CM | POA: Diagnosis not present

## 2022-04-30 DIAGNOSIS — M62552 Muscle wasting and atrophy, not elsewhere classified, left thigh: Secondary | ICD-10-CM | POA: Diagnosis not present

## 2022-04-30 DIAGNOSIS — Z9181 History of falling: Secondary | ICD-10-CM | POA: Diagnosis not present

## 2022-04-30 DIAGNOSIS — M62551 Muscle wasting and atrophy, not elsewhere classified, right thigh: Secondary | ICD-10-CM | POA: Diagnosis not present

## 2022-05-05 DIAGNOSIS — R2681 Unsteadiness on feet: Secondary | ICD-10-CM | POA: Diagnosis not present

## 2022-05-05 DIAGNOSIS — Z9181 History of falling: Secondary | ICD-10-CM | POA: Diagnosis not present

## 2022-05-06 DIAGNOSIS — R2681 Unsteadiness on feet: Secondary | ICD-10-CM | POA: Diagnosis not present

## 2022-05-06 DIAGNOSIS — R2689 Other abnormalities of gait and mobility: Secondary | ICD-10-CM | POA: Diagnosis not present

## 2022-05-06 DIAGNOSIS — Z9181 History of falling: Secondary | ICD-10-CM | POA: Diagnosis not present

## 2022-05-06 DIAGNOSIS — M62552 Muscle wasting and atrophy, not elsewhere classified, left thigh: Secondary | ICD-10-CM | POA: Diagnosis not present

## 2022-05-06 DIAGNOSIS — M62551 Muscle wasting and atrophy, not elsewhere classified, right thigh: Secondary | ICD-10-CM | POA: Diagnosis not present

## 2022-05-07 DIAGNOSIS — R2681 Unsteadiness on feet: Secondary | ICD-10-CM | POA: Diagnosis not present

## 2022-05-07 DIAGNOSIS — Z9181 History of falling: Secondary | ICD-10-CM | POA: Diagnosis not present

## 2022-05-07 DIAGNOSIS — M62551 Muscle wasting and atrophy, not elsewhere classified, right thigh: Secondary | ICD-10-CM | POA: Diagnosis not present

## 2022-05-07 DIAGNOSIS — M62552 Muscle wasting and atrophy, not elsewhere classified, left thigh: Secondary | ICD-10-CM | POA: Diagnosis not present

## 2022-05-07 DIAGNOSIS — R2689 Other abnormalities of gait and mobility: Secondary | ICD-10-CM | POA: Diagnosis not present

## 2022-05-10 DIAGNOSIS — Z9181 History of falling: Secondary | ICD-10-CM | POA: Diagnosis not present

## 2022-05-10 DIAGNOSIS — R2681 Unsteadiness on feet: Secondary | ICD-10-CM | POA: Diagnosis not present

## 2022-05-10 DIAGNOSIS — M62552 Muscle wasting and atrophy, not elsewhere classified, left thigh: Secondary | ICD-10-CM | POA: Diagnosis not present

## 2022-05-10 DIAGNOSIS — R2689 Other abnormalities of gait and mobility: Secondary | ICD-10-CM | POA: Diagnosis not present

## 2022-05-10 DIAGNOSIS — M62551 Muscle wasting and atrophy, not elsewhere classified, right thigh: Secondary | ICD-10-CM | POA: Diagnosis not present

## 2022-05-12 DIAGNOSIS — R2689 Other abnormalities of gait and mobility: Secondary | ICD-10-CM | POA: Diagnosis not present

## 2022-05-12 DIAGNOSIS — M62552 Muscle wasting and atrophy, not elsewhere classified, left thigh: Secondary | ICD-10-CM | POA: Diagnosis not present

## 2022-05-12 DIAGNOSIS — Z9181 History of falling: Secondary | ICD-10-CM | POA: Diagnosis not present

## 2022-05-12 DIAGNOSIS — M62551 Muscle wasting and atrophy, not elsewhere classified, right thigh: Secondary | ICD-10-CM | POA: Diagnosis not present

## 2022-05-12 DIAGNOSIS — R2681 Unsteadiness on feet: Secondary | ICD-10-CM | POA: Diagnosis not present

## 2022-05-14 DIAGNOSIS — Z9181 History of falling: Secondary | ICD-10-CM | POA: Diagnosis not present

## 2022-05-14 DIAGNOSIS — R2681 Unsteadiness on feet: Secondary | ICD-10-CM | POA: Diagnosis not present

## 2022-05-17 DIAGNOSIS — Z9181 History of falling: Secondary | ICD-10-CM | POA: Diagnosis not present

## 2022-05-17 DIAGNOSIS — R2681 Unsteadiness on feet: Secondary | ICD-10-CM | POA: Diagnosis not present

## 2022-05-18 DIAGNOSIS — R2689 Other abnormalities of gait and mobility: Secondary | ICD-10-CM | POA: Diagnosis not present

## 2022-05-18 DIAGNOSIS — M62552 Muscle wasting and atrophy, not elsewhere classified, left thigh: Secondary | ICD-10-CM | POA: Diagnosis not present

## 2022-05-18 DIAGNOSIS — Z9181 History of falling: Secondary | ICD-10-CM | POA: Diagnosis not present

## 2022-05-18 DIAGNOSIS — R2681 Unsteadiness on feet: Secondary | ICD-10-CM | POA: Diagnosis not present

## 2022-05-18 DIAGNOSIS — M62551 Muscle wasting and atrophy, not elsewhere classified, right thigh: Secondary | ICD-10-CM | POA: Diagnosis not present

## 2022-05-19 DIAGNOSIS — M62552 Muscle wasting and atrophy, not elsewhere classified, left thigh: Secondary | ICD-10-CM | POA: Diagnosis not present

## 2022-05-19 DIAGNOSIS — R2681 Unsteadiness on feet: Secondary | ICD-10-CM | POA: Diagnosis not present

## 2022-05-19 DIAGNOSIS — M62551 Muscle wasting and atrophy, not elsewhere classified, right thigh: Secondary | ICD-10-CM | POA: Diagnosis not present

## 2022-05-19 DIAGNOSIS — Z9181 History of falling: Secondary | ICD-10-CM | POA: Diagnosis not present

## 2022-05-19 DIAGNOSIS — R2689 Other abnormalities of gait and mobility: Secondary | ICD-10-CM | POA: Diagnosis not present

## 2022-05-21 DIAGNOSIS — M62551 Muscle wasting and atrophy, not elsewhere classified, right thigh: Secondary | ICD-10-CM | POA: Diagnosis not present

## 2022-05-21 DIAGNOSIS — M62552 Muscle wasting and atrophy, not elsewhere classified, left thigh: Secondary | ICD-10-CM | POA: Diagnosis not present

## 2022-05-21 DIAGNOSIS — R2689 Other abnormalities of gait and mobility: Secondary | ICD-10-CM | POA: Diagnosis not present

## 2022-05-21 DIAGNOSIS — Z9181 History of falling: Secondary | ICD-10-CM | POA: Diagnosis not present

## 2022-05-21 DIAGNOSIS — R2681 Unsteadiness on feet: Secondary | ICD-10-CM | POA: Diagnosis not present

## 2022-05-24 DIAGNOSIS — M62552 Muscle wasting and atrophy, not elsewhere classified, left thigh: Secondary | ICD-10-CM | POA: Diagnosis not present

## 2022-05-24 DIAGNOSIS — M62551 Muscle wasting and atrophy, not elsewhere classified, right thigh: Secondary | ICD-10-CM | POA: Diagnosis not present

## 2022-05-24 DIAGNOSIS — R2689 Other abnormalities of gait and mobility: Secondary | ICD-10-CM | POA: Diagnosis not present

## 2022-05-24 DIAGNOSIS — R2681 Unsteadiness on feet: Secondary | ICD-10-CM | POA: Diagnosis not present

## 2022-05-24 DIAGNOSIS — Z9181 History of falling: Secondary | ICD-10-CM | POA: Diagnosis not present

## 2022-05-25 ENCOUNTER — Telehealth: Payer: Self-pay

## 2022-05-25 NOTE — Telephone Encounter (Signed)
Called patient to schedule Medicare Annual Wellness Visit (AWV). Left message for patient to call back and schedule Medicare Annual Wellness Visit (AWV).  Last date of AWV: 06/09/21  Please schedule an appointment at any time with [facility].  If any questions, please contact me.  Thank you ,  Norton Blizzard, [place] ([place])  [place] [phone]

## 2022-05-26 DIAGNOSIS — R2681 Unsteadiness on feet: Secondary | ICD-10-CM | POA: Diagnosis not present

## 2022-05-26 DIAGNOSIS — Z9181 History of falling: Secondary | ICD-10-CM | POA: Diagnosis not present

## 2022-05-27 ENCOUNTER — Encounter: Payer: Self-pay | Admitting: Internal Medicine

## 2022-05-27 DIAGNOSIS — R2681 Unsteadiness on feet: Secondary | ICD-10-CM | POA: Diagnosis not present

## 2022-05-27 DIAGNOSIS — M62551 Muscle wasting and atrophy, not elsewhere classified, right thigh: Secondary | ICD-10-CM | POA: Diagnosis not present

## 2022-05-27 DIAGNOSIS — Z9181 History of falling: Secondary | ICD-10-CM | POA: Diagnosis not present

## 2022-05-27 DIAGNOSIS — M62552 Muscle wasting and atrophy, not elsewhere classified, left thigh: Secondary | ICD-10-CM | POA: Diagnosis not present

## 2022-05-27 DIAGNOSIS — R2689 Other abnormalities of gait and mobility: Secondary | ICD-10-CM | POA: Diagnosis not present

## 2022-05-27 NOTE — Progress Notes (Signed)
Subjective:    Patient ID: Tami Jacobson, female    DOB: 04/24/1927, 89 y.o.   MRN: [id number]     HPI Tami Jacobson is here for follow up of her chronic medical problems, including htn, DM, B12 def, GERD, anemia  She feels she is doing well.  She is eating well and has gained a little weight.  She is doing some walking.   Right ear - little pain at times - under ear  Medications and allergies reviewed with patient and updated if appropriate.  Current Outpatient Medications on File Prior to Visit  Medication Sig Dispense Refill   acetaminophen (TYLENOL 8 HOUR) 650 MG CR tablet Take 2 tablets every 8 hours by oral route.     clobetasol cream (TEMOVATE) 0.05 % APPLY CREAM TOPICALLY TWICE DAILY AS NEEDED FOR IRRITATION FROM LICHEN SCLEROSUS     cyanocobalamin (,VITAMIN B-12,) 1000 MCG/ML injection Inject 1,000 mcg into the muscle every 30 (thirty) days.     flecainide (TAMBOCOR) 50 MG tablet TAKE 1 TABLET BY MOUTH  TWICE DAILY 180 tablet 3   metoprolol succinate (TOPROL-XL) 50 MG 24 hr tablet Take 1 tablet (50 mg total) by mouth daily. 150 tablet 0   omeprazole (PRILOSEC) 40 MG capsule TAKE 1 CAPSULE BY MOUTH DAILY 100 capsule 2   No current facility-administered medications on file prior to visit.     Review of Systems  Constitutional:  Negative for fever.  Respiratory:  Negative for cough, shortness of breath and wheezing.   Cardiovascular:  Negative for chest pain, palpitations and leg swelling.  Neurological:  Negative for light-headedness and headaches.       Objective:   Vitals:   05/28/22 1010  BP: 122/70  Pulse: 70  Temp: 98.4 F (36.9 C)  SpO2: 99%   BP Readings from Last 3 Encounters:  05/28/22 122/70  04/22/22 138/84  04/14/22 (!) 145/59   Wt Readings from Last 3 Encounters:  05/28/22 117 lb (53.1 kg)  04/22/22 113 lb (51.3 kg)  04/14/22 114 lb 12.8 oz (52.1 kg)   Body mass index is 20.73 kg/m.    Physical Exam Constitutional:       General: She is not in acute distress.    Appearance: Normal appearance.  HENT:     Head: Normocephalic and atraumatic.  Eyes:     Conjunctiva/sclera: Conjunctivae normal.  Cardiovascular:     Rate and Rhythm: Normal rate and regular rhythm.     Heart sounds: Normal heart sounds.  Pulmonary:     Effort: Pulmonary effort is normal. No respiratory distress.     Breath sounds: Normal breath sounds. No wheezing.  Musculoskeletal:     Cervical back: Neck supple.     Right lower leg: No edema.     Left lower leg: No edema.  Lymphadenopathy:     Cervical: No cervical adenopathy.  Skin:    General: Skin is warm and dry.     Findings: No rash.  Neurological:     Mental Status: She is alert. Mental status is at baseline.  Psychiatric:        Mood and Affect: Mood normal.        Behavior: Behavior normal.        Lab Results  Component Value Date   WBC 6.7 03/24/2022   HGB 11.4 (L) 03/24/2022   HCT 32.4 (L) 03/24/2022   PLT 181.0 03/24/2022   GLUCOSE 176 (H) 03/05/2022   CHOL 173 02/22/2019  TRIG 157.0 (H) 02/22/2019   HDL 50.40 02/22/2019   LDLDIRECT 128.3 12/26/2012   LDLCALC 92 02/22/2019   ALT 13 03/05/2022   AST 24 03/05/2022   NA 129 (L) 03/05/2022   K 3.7 03/05/2022   CL 96 (L) 03/05/2022   CREATININE 0.74 03/05/2022   BUN 12 03/05/2022   CO2 26 03/05/2022   TSH 2.519 05/15/2019   HGBA1C 6.7 (H) 11/11/2021   MICROALBUR 1.0 08/23/2018     Assessment & Plan:    See Problem List for Assessment and Plan of chronic medical problems.

## 2022-05-27 NOTE — Patient Instructions (Addendum)
     B12 injection given today.    Blood work was ordered.   The lab is on the first floor.    Medications changes include :   start vitamin B12 supplement 1000 mcg daily      Return in about 4 months (around 09/26/2022) for follow up.

## 2022-05-28 ENCOUNTER — Ambulatory Visit (INDEPENDENT_AMBULATORY_CARE_PROVIDER_SITE_OTHER): Payer: Medicare Other | Admitting: Internal Medicine

## 2022-05-28 ENCOUNTER — Ambulatory Visit: Payer: Medicare Other | Admitting: Internal Medicine

## 2022-05-28 ENCOUNTER — Encounter: Payer: Self-pay | Admitting: Internal Medicine

## 2022-05-28 VITALS — BP 122/70 | HR 70 | Temp 98.4°F | Ht 63.0 in | Wt 117.0 lb

## 2022-05-28 DIAGNOSIS — E538 Deficiency of other specified B group vitamins: Secondary | ICD-10-CM

## 2022-05-28 DIAGNOSIS — R2681 Unsteadiness on feet: Secondary | ICD-10-CM | POA: Diagnosis not present

## 2022-05-28 DIAGNOSIS — D649 Anemia, unspecified: Secondary | ICD-10-CM | POA: Diagnosis not present

## 2022-05-28 DIAGNOSIS — M62552 Muscle wasting and atrophy, not elsewhere classified, left thigh: Secondary | ICD-10-CM | POA: Diagnosis not present

## 2022-05-28 DIAGNOSIS — E118 Type 2 diabetes mellitus with unspecified complications: Secondary | ICD-10-CM | POA: Diagnosis not present

## 2022-05-28 DIAGNOSIS — M62551 Muscle wasting and atrophy, not elsewhere classified, right thigh: Secondary | ICD-10-CM | POA: Diagnosis not present

## 2022-05-28 DIAGNOSIS — R2689 Other abnormalities of gait and mobility: Secondary | ICD-10-CM | POA: Diagnosis not present

## 2022-05-28 DIAGNOSIS — K219 Gastro-esophageal reflux disease without esophagitis: Secondary | ICD-10-CM

## 2022-05-28 DIAGNOSIS — Z9181 History of falling: Secondary | ICD-10-CM | POA: Diagnosis not present

## 2022-05-28 DIAGNOSIS — I10 Essential (primary) hypertension: Secondary | ICD-10-CM | POA: Diagnosis not present

## 2022-05-28 MED ORDER — AMLODIPINE BESYLATE 2.5 MG PO TABS: 2.5000 mg | ORAL_TABLET | Freq: Every day | ORAL | 1 refills | Status: DC

## 2022-05-28 MED ORDER — LOSARTAN POTASSIUM 100 MG PO TABS: 100.0000 mg | ORAL_TABLET | Freq: Every day | ORAL | 1 refills | Status: DC

## 2022-05-28 MED ORDER — CYANOCOBALAMIN 1000 MCG/ML IJ SOLN
1000.0000 ug | Freq: Once | INTRAMUSCULAR | Status: AC
  Administered 2022-05-28: 1000 ug via INTRAMUSCULAR

## 2022-05-28 MED ORDER — METFORMIN HCL ER 500 MG PO TB24: 500.0000 mg | ORAL_TABLET | Freq: Every day | ORAL | 2 refills | Status: DC

## 2022-05-28 NOTE — Assessment & Plan Note (Signed)
Chronic Has been on B12 injections for a long time-no longer driving and it is difficult for her to get here Will give B12 injection today She will start 1000 mcg of vitamin B12 daily Will recheck B12 level in 4 months and monitor to see if she can avoid monthly injections since she no longer drives

## 2022-05-28 NOTE — Assessment & Plan Note (Signed)
Chronic GERD controlled Continue omeprazole 40 mg daily 

## 2022-05-28 NOTE — Assessment & Plan Note (Signed)
Chronic   Lab Results  Component Value Date   HGBA1C 6.7 (H) 11/11/2021   Sugars well controlled Check A1c Continue metformin XR 500 mg daily Stressed regular exercise, diabetic diet

## 2022-05-28 NOTE — Assessment & Plan Note (Signed)
Chronic Check CBC

## 2022-05-28 NOTE — Assessment & Plan Note (Signed)
Chronic BP well controlled Continue amodipine 2.5 mg daily, losartan 100 mg daily, metoprolol xl 50 mg daily cmp

## 2022-06-02 ENCOUNTER — Encounter: Payer: Self-pay | Admitting: Podiatry

## 2022-06-02 ENCOUNTER — Ambulatory Visit (INDEPENDENT_AMBULATORY_CARE_PROVIDER_SITE_OTHER): Payer: Medicare Other | Admitting: Podiatry

## 2022-06-02 VITALS — BP 150/52

## 2022-06-02 DIAGNOSIS — M2012 Hallux valgus (acquired), left foot: Secondary | ICD-10-CM | POA: Diagnosis not present

## 2022-06-02 DIAGNOSIS — E119 Type 2 diabetes mellitus without complications: Secondary | ICD-10-CM | POA: Diagnosis not present

## 2022-06-02 DIAGNOSIS — M79674 Pain in right toe(s): Secondary | ICD-10-CM

## 2022-06-02 DIAGNOSIS — Z9181 History of falling: Secondary | ICD-10-CM | POA: Diagnosis not present

## 2022-06-02 DIAGNOSIS — M79675 Pain in left toe(s): Secondary | ICD-10-CM | POA: Diagnosis not present

## 2022-06-02 DIAGNOSIS — E118 Type 2 diabetes mellitus with unspecified complications: Secondary | ICD-10-CM

## 2022-06-02 DIAGNOSIS — M2011 Hallux valgus (acquired), right foot: Secondary | ICD-10-CM

## 2022-06-02 DIAGNOSIS — R2681 Unsteadiness on feet: Secondary | ICD-10-CM | POA: Diagnosis not present

## 2022-06-02 DIAGNOSIS — B351 Tinea unguium: Secondary | ICD-10-CM | POA: Diagnosis not present

## 2022-06-02 DIAGNOSIS — S91112A Laceration without foreign body of left great toe without damage to nail, initial encounter: Secondary | ICD-10-CM | POA: Diagnosis not present

## 2022-06-02 NOTE — Progress Notes (Signed)
ANNUAL DIABETIC FOOT EXAM  Subjective: Tami Jacobson presents today for annual diabetic foot examination.  Chief Complaint  Patient presents with   Nail Problem    DFC BS-did not check today A1C- do not remember  PCP-Burns, Tami Jacobson PCP VST-1 week ago    Patient confirms h/o diabetes.  Patient denies any h/o foot wounds.  Patient denies any numbness, tingling, burning, or pins/needle sensation in feet.  Risk factors: diabetes, HTN, CHF, dyslipidemia.  Tami Rail, MD is patient's PCP.   Past Medical History:  Diagnosis Date   Arthritis    left hip   Breast cancer, right ([place]) 11/2016   Family history of breast cancer    Family history of colon cancer    Family history of pancreatic cancer    GERD (gastroesophageal reflux disease)    Non-insulin dependent type 2 diabetes mellitus ([place])    PAC (premature atrial contraction)    Personal history of radiation therapy 12/2016   Premature ventricular contraction    Seasonal allergies    Sensitive skin    Vitamin B12 deficiency    Patient Active Problem List   Diagnosis Date Noted   Weakness generalized 12/23/2021   Decreased hearing 12/18/2021   Excessive cerumen in both ear canals 11/20/2021   Decreased activities of daily living (ADL) 11/11/2021   Right leg pain 09/12/2021   Lumbar radiculopathy, right 08/10/2021   Anemia 08/09/2021   Atrial fibrillation ([place]) 07/23/2021   Fall 07/22/2021   Low back pain 07/06/2021   Dry mouth 12/09/2020   Tremor [id number]   Lichen sclerosus et atrophicus of the vulva 04/04/2020   Cataract 04/04/2020   Mild memory disturbance 01/30/2020   Palpitations 05/23/2019   Chronic diastolic CHF (congestive heart failure) ([place]) 05/14/2019   Mild tricuspid regurgitation 05/13/2019   LVH (left ventricular hypertrophy), mild 05/13/2019   Genetic testing 08/30/2017   Family history of breast cancer    Family history of colon cancer    Family history of pancreatic cancer     Abnormal electrocardiogram 05/05/2015   BPPV (benign paroxysmal positional vertigo) 10/01/2014   Murmur 04/18/2014   Anal fissure 12/16/2011   Fibrocystic breast changes. Left.  Biopsy 07/02/2011. 07/14/2011   History of breast cancer, Right, T1c, N0, lumpectomy 06/26/2008.   Left, T1c, N0, lumpectomy 06/24/2006. 05/12/2011   Constipation 07/03/2009   Diabetes mellitus type 2, controlled ([place]) 06/11/2009   PREMATURE VENTRICULAR CONTRACTIONS 08/19/2008   B12 deficiency 08/29/2007   ESOPHAGEAL STRICTURE 04/27/2007   GERD (gastroesophageal reflux disease) 04/27/2007   DIVERTICULOSIS, COLON 04/27/2007   OTHER ALOPECIA 02/14/2007   Dyslipidemia 01/02/2007   Essential hypertension 01/02/2007   Carotid stenosis 08/22/2006   Past Surgical History:  Procedure Laterality Date   ABDOMINAL HYSTERECTOMY  1979   partial   BREAST BIOPSY  07/02/2011   Procedure: BREAST BIOPSY WITH NEEDLE LOCALIZATION;  Surgeon: Shann Medal, MD;  Location: [facility];  Service: General;  Laterality: Left;  left breast atypical hyperplasia needle localization biopsy   BREAST BIOPSY Left 06/13/2006   BREAST BIOPSY Right 06/27/2008   BREAST BIOPSY Right 09/27/2016   BREAST EXCISIONAL BIOPSY Left 07/02/2011   BREAST LUMPECTOMY Left 07/04/2006   BREAST LUMPECTOMY Right 06/26/2008   BREAST LUMPECTOMY Right 09/27/2016   BREAST LUMPECTOMY WITH RADIOACTIVE SEED LOCALIZATION Right 11/18/2016   Procedure: RIGHT BREAST LUMPECTOMY WITH RADIOACTIVE SEED LOCALIZATION ERAS PATHWAY;  Surgeon: Alphonsa Overall, MD;  Location: [facility];  Service: General;  Laterality: Right;  ERAS PATHWAY  CATARACT EXTRACTION Bilateral    CHOLECYSTECTOMY, LAPAROSCOPIC  07/07/2001   had appendex taking out at the same time   ESOPHAGEAL DILATION  2006   LAPAROSCOPIC APPENDECTOMY  07/07/2001   TONSILLECTOMY  1942   Current Outpatient Medications on File Prior to Visit  Medication Sig Dispense Refill   acetaminophen (TYLENOL 8 HOUR) 650 MG  CR tablet Take 2 tablets every 8 hours by oral route.     amLODipine (NORVASC) 2.5 MG tablet Take 1 tablet (2.5 mg total) by mouth daily. For blood pressure 100 tablet 1   clobetasol cream (TEMOVATE) 0.05 % APPLY CREAM TOPICALLY TWICE DAILY AS NEEDED FOR IRRITATION FROM LICHEN SCLEROSUS     cyanocobalamin (,VITAMIN B-12,) 1000 MCG/ML injection Inject 1,000 mcg into the muscle every 30 (thirty) days.     flecainide (TAMBOCOR) 50 MG tablet TAKE 1 TABLET BY MOUTH  TWICE DAILY 180 tablet 3   losartan (COZAAR) 100 MG tablet Take 1 tablet (100 mg total) by mouth daily. For blood pressure 100 tablet 1   metFORMIN (GLUCOPHAGE-XR) 500 MG 24 hr tablet Take 1 tablet (500 mg total) by mouth daily with breakfast. For your sugar 100 tablet 2   metoprolol succinate (TOPROL-XL) 50 MG 24 hr tablet Take 1 tablet (50 mg total) by mouth daily. 150 tablet 0   omeprazole (PRILOSEC) 40 MG capsule TAKE 1 CAPSULE BY MOUTH DAILY 100 capsule 2   No current facility-administered medications on file prior to visit.    Allergies  Allergen Reactions   Statins Other (See Comments)    GI UPSET   Effexor [Venlafaxine]     Constipation, shaking, dizzy   Gabapentin     drowsniess   Cephalexin Other (See Comments)    Reaction not recalled by the patient   Codeine Other (See Comments)    Reaction not recalled by the patient   Lansoprazole Other (See Comments)    Reaction not recalled by the patient   Latex Rash        Levofloxacin Other (See Comments)    Reaction not recalled by the patient   Nitrofurantoin Other (See Comments)    Reaction not recalled by the patient   Rofecoxib Other (See Comments)    Reaction not recalled by the patient    Social History   Occupational History   Occupation: retire  Tobacco Use   Smoking status: Never   Smokeless tobacco: Never  Vaping Use   Vaping Use: Never used  Substance and Sexual Activity   Alcohol use: No    Alcohol/week: 0.0 standard drinks of alcohol   Drug use:  No   Sexual activity: Not Currently   Family History  Problem Relation Age of Onset   Colon cancer Mother 24   Heart attack Mother    Breast cancer Sister 24   Heart disease Maternal Uncle        CABG   Breast cancer Sister 12   Heart disease Brother    Other Father        suicide   Breast cancer Sister 37   Heart disease Sister    COPD Brother    Heart disease Brother    Breast cancer Other 106       niece - brother's daughter   Pancreatic cancer Other        brother's son, dx in his 34s   Immunization History  Administered Date(s) Administered   Fluad Quad(high Dose 65+) 01/04/2019, 01/07/2020, 12/09/2020, 12/18/2021   Influenza Split 12/14/2011  Influenza Whole 02/01/2007, 01/03/2009, 12/15/2010   Influenza, High Dose Seasonal PF 01/01/2014, 12/25/2014, 12/15/2015, 12/24/2016, 12/27/2017   Influenza,inj,Quad PF,6+ Mos 12/26/2012   Moderna Sars-Covid-2 Vaccination 06/03/2019, 06/30/2019, 03/07/2020   Pneumococcal Conjugate-13 07/02/2014   Pneumococcal Polysaccharide-23 02/16/2002, 02/09/2012   Td 06/10/2009     Review of Systems: Negative except as noted in the HPI.   Objective: Vitals:   06/02/22 1142  BP: (!) 150/52   Tami Jacobson is a pleasant 89 y.o. female in NAD. AAO X 3.  Vascular Examination: CFT <3 seconds b/l LE. Faintly palpable DP pulses b/l LE. Faintly palpable PT pulse(s) b/l LE. Pedal hair absent. No pain with calf compression b/l. Lower extremity skin temperature gradient within normal limits. No edema noted b/l LE. Mild varicosities b/l LE. No ischemia or gangrene noted b/l LE. No cyanosis or clubbing noted b/l LE.  Dermatological Examination: Pedal skin thin, shiny and atrophic b/l LE. Evidence of healing laceration distal tip left hallux and proximal medial nailbed left hallux. There is dried heme. No erythema, no edema, no drainage, no fluctuance. No interdigital macerations noted b/l LE. Anonychia noted bilateral great toes. Nailbed(s)  epithelialized.  Hyperkeratotic lesion(s) nailbed right hallux.  No erythema, no edema, no drainage, no fluctuance.  Toenails 2-5 b/l thickened, incurvated with sharp edges distally.   Neurological Examination: Protective sensation intact 5/5 intact bilaterally with 10g monofilament b/l. Vibratory sensation intact b/l.  Musculoskeletal Examination: Muscle strength 5/5 to all lower extremity muscle groups bilaterally. HAV with bunion deformity noted b/l LE. Wearing appropriate fitting shoe gear. Utilizes rollator for ambulation assistance.  Footwear Assessment: Does the patient wear appropriate shoes? Yes. Does the patient need inserts/orthotics? No.  Lab Results  Component Value Date   HGBA1C 6.7 (H) 11/11/2021   ADA Risk Categorization: Low Risk :  Patient has all of the following: Intact protective sensation No prior foot ulcer  No severe deformity Pedal pulses present  Assessment: 1. Pain due to onychomycosis of toenails of both feet   2. Laceration of left great toe without foreign body present or damage to nail, initial encounter   3. Hallux valgus, acquired, bilateral   4. Controlled type 2 diabetes mellitus with complication, without long-term current use of insulin ([place])   5. Encounter for diabetic foot exam [facility])      Plan: -Patient was evaluated and treated. All patient's and/or POA's questions/concerns answered on today's visit. -Discussed injury sustained left great toe during pedicure. Advised patient against getting pedicures in the future. -Diabetic foot examination performed today. -Toenails 2-5 b/l debrided in length and girth via gentle filing with dremel without incident. -Stressed the importance of good glycemic control and the detriment of not  controlling glucose levels in relation to the foot. -Discussed diabetic foot care principles. Literature dispensed on today. -Patient to continue soft, supportive shoe gear daily. -Instructed patient to apply  antibiotic ointment to left great toe once daily for an additional week. -Dispensed tube foam. Apply to bilateral great toes every morning. Remove every evening. -Patient/POA to call should there be question/concern in the interim. Return in about 3 months (around 08/31/2022).  Tami Jacobson, DPM

## 2022-06-02 NOTE — Patient Instructions (Signed)
Do not get any more pedicures.  Apply antibiotic ointment to left great toe once daily for one week.  For protection, apply tube foam to both great toes every morning; remove every evening.

## 2022-06-03 DIAGNOSIS — R2689 Other abnormalities of gait and mobility: Secondary | ICD-10-CM | POA: Diagnosis not present

## 2022-06-03 DIAGNOSIS — M62552 Muscle wasting and atrophy, not elsewhere classified, left thigh: Secondary | ICD-10-CM | POA: Diagnosis not present

## 2022-06-03 DIAGNOSIS — M62551 Muscle wasting and atrophy, not elsewhere classified, right thigh: Secondary | ICD-10-CM | POA: Diagnosis not present

## 2022-06-03 DIAGNOSIS — R2681 Unsteadiness on feet: Secondary | ICD-10-CM | POA: Diagnosis not present

## 2022-06-03 DIAGNOSIS — Z9181 History of falling: Secondary | ICD-10-CM | POA: Diagnosis not present

## 2022-06-04 DIAGNOSIS — Z9181 History of falling: Secondary | ICD-10-CM | POA: Diagnosis not present

## 2022-06-04 DIAGNOSIS — R2681 Unsteadiness on feet: Secondary | ICD-10-CM | POA: Diagnosis not present

## 2022-06-07 DIAGNOSIS — Z9181 History of falling: Secondary | ICD-10-CM | POA: Diagnosis not present

## 2022-06-07 DIAGNOSIS — M62551 Muscle wasting and atrophy, not elsewhere classified, right thigh: Secondary | ICD-10-CM | POA: Diagnosis not present

## 2022-06-07 DIAGNOSIS — M62552 Muscle wasting and atrophy, not elsewhere classified, left thigh: Secondary | ICD-10-CM | POA: Diagnosis not present

## 2022-06-07 DIAGNOSIS — R2681 Unsteadiness on feet: Secondary | ICD-10-CM | POA: Diagnosis not present

## 2022-06-07 DIAGNOSIS — R2689 Other abnormalities of gait and mobility: Secondary | ICD-10-CM | POA: Diagnosis not present

## 2022-06-09 DIAGNOSIS — M62551 Muscle wasting and atrophy, not elsewhere classified, right thigh: Secondary | ICD-10-CM | POA: Diagnosis not present

## 2022-06-09 DIAGNOSIS — M62552 Muscle wasting and atrophy, not elsewhere classified, left thigh: Secondary | ICD-10-CM | POA: Diagnosis not present

## 2022-06-09 DIAGNOSIS — R2681 Unsteadiness on feet: Secondary | ICD-10-CM | POA: Diagnosis not present

## 2022-06-09 DIAGNOSIS — Z9181 History of falling: Secondary | ICD-10-CM | POA: Diagnosis not present

## 2022-06-09 DIAGNOSIS — R2689 Other abnormalities of gait and mobility: Secondary | ICD-10-CM | POA: Diagnosis not present

## 2022-06-11 DIAGNOSIS — M62552 Muscle wasting and atrophy, not elsewhere classified, left thigh: Secondary | ICD-10-CM | POA: Diagnosis not present

## 2022-06-11 DIAGNOSIS — R2689 Other abnormalities of gait and mobility: Secondary | ICD-10-CM | POA: Diagnosis not present

## 2022-06-11 DIAGNOSIS — M62551 Muscle wasting and atrophy, not elsewhere classified, right thigh: Secondary | ICD-10-CM | POA: Diagnosis not present

## 2022-06-11 DIAGNOSIS — R2681 Unsteadiness on feet: Secondary | ICD-10-CM | POA: Diagnosis not present

## 2022-06-11 DIAGNOSIS — Z9181 History of falling: Secondary | ICD-10-CM | POA: Diagnosis not present

## 2022-06-17 DIAGNOSIS — R2689 Other abnormalities of gait and mobility: Secondary | ICD-10-CM | POA: Diagnosis not present

## 2022-06-17 DIAGNOSIS — R2681 Unsteadiness on feet: Secondary | ICD-10-CM | POA: Diagnosis not present

## 2022-06-17 DIAGNOSIS — M62551 Muscle wasting and atrophy, not elsewhere classified, right thigh: Secondary | ICD-10-CM | POA: Diagnosis not present

## 2022-06-17 DIAGNOSIS — Z9181 History of falling: Secondary | ICD-10-CM | POA: Diagnosis not present

## 2022-06-17 DIAGNOSIS — M62552 Muscle wasting and atrophy, not elsewhere classified, left thigh: Secondary | ICD-10-CM | POA: Diagnosis not present

## 2022-06-22 DIAGNOSIS — R2689 Other abnormalities of gait and mobility: Secondary | ICD-10-CM | POA: Diagnosis not present

## 2022-06-22 DIAGNOSIS — M62552 Muscle wasting and atrophy, not elsewhere classified, left thigh: Secondary | ICD-10-CM | POA: Diagnosis not present

## 2022-06-22 DIAGNOSIS — M62551 Muscle wasting and atrophy, not elsewhere classified, right thigh: Secondary | ICD-10-CM | POA: Diagnosis not present

## 2022-06-22 DIAGNOSIS — R2681 Unsteadiness on feet: Secondary | ICD-10-CM | POA: Diagnosis not present

## 2022-06-22 DIAGNOSIS — Z9181 History of falling: Secondary | ICD-10-CM | POA: Diagnosis not present

## 2022-06-24 DIAGNOSIS — R2681 Unsteadiness on feet: Secondary | ICD-10-CM | POA: Diagnosis not present

## 2022-06-24 DIAGNOSIS — M62551 Muscle wasting and atrophy, not elsewhere classified, right thigh: Secondary | ICD-10-CM | POA: Diagnosis not present

## 2022-06-24 DIAGNOSIS — M62552 Muscle wasting and atrophy, not elsewhere classified, left thigh: Secondary | ICD-10-CM | POA: Diagnosis not present

## 2022-06-24 DIAGNOSIS — R2689 Other abnormalities of gait and mobility: Secondary | ICD-10-CM | POA: Diagnosis not present

## 2022-06-24 DIAGNOSIS — Z9181 History of falling: Secondary | ICD-10-CM | POA: Diagnosis not present

## 2022-06-28 DIAGNOSIS — M62552 Muscle wasting and atrophy, not elsewhere classified, left thigh: Secondary | ICD-10-CM | POA: Diagnosis not present

## 2022-06-28 DIAGNOSIS — R2689 Other abnormalities of gait and mobility: Secondary | ICD-10-CM | POA: Diagnosis not present

## 2022-06-28 DIAGNOSIS — M62551 Muscle wasting and atrophy, not elsewhere classified, right thigh: Secondary | ICD-10-CM | POA: Diagnosis not present

## 2022-06-28 DIAGNOSIS — R2681 Unsteadiness on feet: Secondary | ICD-10-CM | POA: Diagnosis not present

## 2022-06-28 DIAGNOSIS — Z9181 History of falling: Secondary | ICD-10-CM | POA: Diagnosis not present

## 2022-07-01 DIAGNOSIS — M62552 Muscle wasting and atrophy, not elsewhere classified, left thigh: Secondary | ICD-10-CM | POA: Diagnosis not present

## 2022-07-01 DIAGNOSIS — R2689 Other abnormalities of gait and mobility: Secondary | ICD-10-CM | POA: Diagnosis not present

## 2022-07-01 DIAGNOSIS — Z9181 History of falling: Secondary | ICD-10-CM | POA: Diagnosis not present

## 2022-07-01 DIAGNOSIS — M62551 Muscle wasting and atrophy, not elsewhere classified, right thigh: Secondary | ICD-10-CM | POA: Diagnosis not present

## 2022-07-01 DIAGNOSIS — R2681 Unsteadiness on feet: Secondary | ICD-10-CM | POA: Diagnosis not present

## 2022-07-02 DIAGNOSIS — M62551 Muscle wasting and atrophy, not elsewhere classified, right thigh: Secondary | ICD-10-CM | POA: Diagnosis not present

## 2022-07-02 DIAGNOSIS — R2689 Other abnormalities of gait and mobility: Secondary | ICD-10-CM | POA: Diagnosis not present

## 2022-07-02 DIAGNOSIS — M62552 Muscle wasting and atrophy, not elsewhere classified, left thigh: Secondary | ICD-10-CM | POA: Diagnosis not present

## 2022-07-02 DIAGNOSIS — Z9181 History of falling: Secondary | ICD-10-CM | POA: Diagnosis not present

## 2022-07-02 DIAGNOSIS — R2681 Unsteadiness on feet: Secondary | ICD-10-CM | POA: Diagnosis not present

## 2022-07-05 DIAGNOSIS — M62552 Muscle wasting and atrophy, not elsewhere classified, left thigh: Secondary | ICD-10-CM | POA: Diagnosis not present

## 2022-07-05 DIAGNOSIS — R2689 Other abnormalities of gait and mobility: Secondary | ICD-10-CM | POA: Diagnosis not present

## 2022-07-05 DIAGNOSIS — Z9181 History of falling: Secondary | ICD-10-CM | POA: Diagnosis not present

## 2022-07-05 DIAGNOSIS — M62551 Muscle wasting and atrophy, not elsewhere classified, right thigh: Secondary | ICD-10-CM | POA: Diagnosis not present

## 2022-07-05 DIAGNOSIS — R2681 Unsteadiness on feet: Secondary | ICD-10-CM | POA: Diagnosis not present

## 2022-07-06 DIAGNOSIS — D044 Carcinoma in situ of skin of scalp and neck: Secondary | ICD-10-CM | POA: Diagnosis not present

## 2022-07-06 DIAGNOSIS — D692 Other nonthrombocytopenic purpura: Secondary | ICD-10-CM | POA: Diagnosis not present

## 2022-07-06 DIAGNOSIS — Z85828 Personal history of other malignant neoplasm of skin: Secondary | ICD-10-CM | POA: Diagnosis not present

## 2022-07-06 DIAGNOSIS — L821 Other seborrheic keratosis: Secondary | ICD-10-CM | POA: Diagnosis not present

## 2022-07-12 DIAGNOSIS — M62552 Muscle wasting and atrophy, not elsewhere classified, left thigh: Secondary | ICD-10-CM | POA: Diagnosis not present

## 2022-07-12 DIAGNOSIS — Z9181 History of falling: Secondary | ICD-10-CM | POA: Diagnosis not present

## 2022-07-12 DIAGNOSIS — R2681 Unsteadiness on feet: Secondary | ICD-10-CM | POA: Diagnosis not present

## 2022-07-12 DIAGNOSIS — R2689 Other abnormalities of gait and mobility: Secondary | ICD-10-CM | POA: Diagnosis not present

## 2022-07-12 DIAGNOSIS — M62551 Muscle wasting and atrophy, not elsewhere classified, right thigh: Secondary | ICD-10-CM | POA: Diagnosis not present

## 2022-07-13 DIAGNOSIS — R2681 Unsteadiness on feet: Secondary | ICD-10-CM | POA: Diagnosis not present

## 2022-07-13 DIAGNOSIS — Z9181 History of falling: Secondary | ICD-10-CM | POA: Diagnosis not present

## 2022-07-13 DIAGNOSIS — M62552 Muscle wasting and atrophy, not elsewhere classified, left thigh: Secondary | ICD-10-CM | POA: Diagnosis not present

## 2022-07-13 DIAGNOSIS — M62551 Muscle wasting and atrophy, not elsewhere classified, right thigh: Secondary | ICD-10-CM | POA: Diagnosis not present

## 2022-07-13 DIAGNOSIS — R2689 Other abnormalities of gait and mobility: Secondary | ICD-10-CM | POA: Diagnosis not present

## 2022-07-19 ENCOUNTER — Other Ambulatory Visit: Payer: Self-pay | Admitting: Cardiology

## 2022-07-19 DIAGNOSIS — R002 Palpitations: Secondary | ICD-10-CM

## 2022-07-20 ENCOUNTER — Other Ambulatory Visit: Payer: Self-pay | Admitting: Cardiology

## 2022-08-24 ENCOUNTER — Encounter: Payer: Self-pay | Admitting: Internal Medicine

## 2022-08-24 ENCOUNTER — Ambulatory Visit: Payer: Medicare Other | Admitting: Internal Medicine

## 2022-08-24 ENCOUNTER — Ambulatory Visit (INDEPENDENT_AMBULATORY_CARE_PROVIDER_SITE_OTHER): Payer: Medicare Other | Admitting: Internal Medicine

## 2022-08-24 VITALS — BP 136/60 | HR 62 | Temp 97.9°F | Ht 63.0 in | Wt 121.0 lb

## 2022-08-24 DIAGNOSIS — E538 Deficiency of other specified B group vitamins: Secondary | ICD-10-CM

## 2022-08-24 DIAGNOSIS — L609 Nail disorder, unspecified: Secondary | ICD-10-CM | POA: Diagnosis not present

## 2022-08-24 DIAGNOSIS — I10 Essential (primary) hypertension: Secondary | ICD-10-CM

## 2022-08-24 MED ORDER — CYANOCOBALAMIN 1000 MCG/ML IJ SOLN
1000.0000 ug | Freq: Once | INTRAMUSCULAR | Status: AC
  Administered 2022-08-24: 1000 ug via INTRAMUSCULAR

## 2022-08-24 NOTE — Assessment & Plan Note (Signed)
Chronic BP well controlled Continue amodipine 2.5 mg daily, losartan 100 mg daily, metoprolol xl 50 mg daily

## 2022-08-24 NOTE — Progress Notes (Signed)
    Subjective:    Patient ID: Tami Jacobson, female    DOB: 1928/02/08, 89 y.o.   MRN: [id number]      HPI Tami Jacobson is here for  Chief Complaint  Patient presents with    Finger nail coming off    Finger nail on thumb is coming off on the side (finger hurts when you mash on it)    Left thumb nail coming off - it was tender for while - denies injury -- it does not hurt now.  Half of the nail is not attached and looks like it is coming off.  She is not sure what she should do at this point.   She wants to know if she should continue B12 injections.  She is taking some oral B12 but does not feel it is working as well.  Medications and allergies reviewed with patient and updated if appropriate.  Current Outpatient Medications on File Prior to Visit  Medication Sig Dispense Refill   acetaminophen (TYLENOL 8 HOUR) 650 MG CR tablet Take 2 tablets every 8 hours by oral route.     amLODipine (NORVASC) 2.5 MG tablet Take 1 tablet (2.5 mg total) by mouth daily. For blood pressure 100 tablet 1   clobetasol cream (TEMOVATE) 0.05 % APPLY CREAM TOPICALLY TWICE DAILY AS NEEDED FOR IRRITATION FROM LICHEN SCLEROSUS     cyanocobalamin (,VITAMIN B-12,) 1000 MCG/ML injection Inject 1,000 mcg into the muscle every 30 (thirty) days.     flecainide (TAMBOCOR) 50 MG tablet TAKE 1 TABLET BY MOUTH TWICE  DAILY 200 tablet 2   losartan (COZAAR) 100 MG tablet Take 1 tablet (100 mg total) by mouth daily. For blood pressure 100 tablet 1   metFORMIN (GLUCOPHAGE-XR) 500 MG 24 hr tablet Take 1 tablet (500 mg total) by mouth daily with breakfast. For your sugar 100 tablet 2   metoprolol succinate (TOPROL-XL) 50 MG 24 hr tablet TAKE 1 TABLET BY MOUTH DAILY 100 tablet 2   omeprazole (PRILOSEC) 40 MG capsule TAKE 1 CAPSULE BY MOUTH DAILY 100 capsule 2   No current facility-administered medications on file prior to visit.    Review of Systems     Objective:   Vitals:   08/24/22 1315  BP: 136/60  Pulse:  62  Temp: 97.9 F (36.6 C)  SpO2: 91%   BP Readings from Last 3 Encounters:  08/24/22 136/60  06/02/22 (!) 150/52  05/28/22 122/70   Wt Readings from Last 3 Encounters:  08/24/22 121 lb (54.9 kg)  05/28/22 117 lb (53.1 kg)  04/22/22 113 lb (51.3 kg)   Body mass index is 21.43 kg/m.    Physical Exam Constitutional:      General: She is not in acute distress.    Appearance: Normal appearance. She is not ill-appearing.  Skin:    General: Skin is warm and dry.     Comments: Left thumbnail is thickened and slightly yellow, the distal half of the nail on the lateral aspect only is not attached, but the medial aspect of the nail is.  No nail base swelling or erythema, no tenderness with palpation throughout the distal.  Sensation intact  Neurological:     Mental Status: She is alert.            Assessment & Plan:    See Problem List for Assessment and Plan of chronic medical problems.

## 2022-08-24 NOTE — Assessment & Plan Note (Signed)
Chronic Has been on B12 injections for a long time do to having poor oral absorption -no longer driving and it is difficult for her to get here - will do injection when she is able to get here Will give B12 injection today Continue 1000 mcg of vitamin B12 daily

## 2022-08-24 NOTE — Patient Instructions (Signed)
      B12 injection today    Medications changes include :   none     See  you next month

## 2022-08-24 NOTE — Assessment & Plan Note (Addendum)
Acute Left thumb nail is coming off - it is slightly thickened and slightly yellow - possible onychomycosis There may have been in that she does not realize there was an injury, but not at this point there is no evidence of infection and no pain Would not remove the nail-would keep it covered if that does not rip off Monitor

## 2022-09-08 ENCOUNTER — Encounter: Payer: Self-pay | Admitting: Internal Medicine

## 2022-09-08 ENCOUNTER — Ambulatory Visit (INDEPENDENT_AMBULATORY_CARE_PROVIDER_SITE_OTHER): Payer: Medicare Other | Admitting: Internal Medicine

## 2022-09-08 VITALS — BP 128/78 | HR 62 | Temp 97.9°F | Ht 63.0 in | Wt 122.0 lb

## 2022-09-08 DIAGNOSIS — L989 Disorder of the skin and subcutaneous tissue, unspecified: Secondary | ICD-10-CM | POA: Diagnosis not present

## 2022-09-08 DIAGNOSIS — D692 Other nonthrombocytopenic purpura: Secondary | ICD-10-CM | POA: Diagnosis not present

## 2022-09-08 DIAGNOSIS — I10 Essential (primary) hypertension: Secondary | ICD-10-CM

## 2022-09-08 DIAGNOSIS — L609 Nail disorder, unspecified: Secondary | ICD-10-CM

## 2022-09-08 NOTE — Assessment & Plan Note (Signed)
Subacute Left thumbnail is coming off-possibly related to injury or onychomycosis It is slowly becoming unattached to the finger-new nail is growing underneath Do not have the equipment cut the nail and she will continue to keep a Band-Aid on the finger so that the nail does not rip off

## 2022-09-08 NOTE — Assessment & Plan Note (Signed)
Chronic BP initially elevated, but better on repeat Continue amodipine 2.5 mg daily, losartan 100 mg daily, metoprolol xl 50 mg daily

## 2022-09-08 NOTE — Assessment & Plan Note (Signed)
Gross right forehead She states that this came up quickly over the past 2 weeks or so No pain Has not been with dermatology approximately 1 month Unfortunately she will have to wait until then to have this evaluated, but reassured her waiting that long is not a concern

## 2022-09-08 NOTE — Progress Notes (Signed)
Subjective:    Patient ID: Tami Jacobson, female    DOB: 01-24-1928, 89 y.o.   MRN: [id number]      HPI Gerlene is here for  Chief Complaint  Patient presents with   Rash    Rash on right side of forehead; bruise on right hand    Growth on right forehead - came out in 2 weeks - getting larger.  No pain, no itch.  Has appt with dermatology 7/9.   Bruises on b/l hands - no significant trauma  Dry mouth, burning lips and tongue. - does not drink much fluids.  She sleeps with her mouth open.  Left thumb - nail still growing out and nail coming off.  No pain.   Medications and allergies reviewed with patient and updated if appropriate.  Current Outpatient Medications on File Prior to Visit  Medication Sig Dispense Refill   acetaminophen (TYLENOL 8 HOUR) 650 MG CR tablet Take 2 tablets every 8 hours by oral route.     amLODipine (NORVASC) 2.5 MG tablet Take 1 tablet (2.5 mg total) by mouth daily. For blood pressure 100 tablet 1   clobetasol cream (TEMOVATE) 0.05 % APPLY CREAM TOPICALLY TWICE DAILY AS NEEDED FOR IRRITATION FROM LICHEN SCLEROSUS     cyanocobalamin (,VITAMIN B-12,) 1000 MCG/ML injection Inject 1,000 mcg into the muscle every 30 (thirty) days.     flecainide (TAMBOCOR) 50 MG tablet TAKE 1 TABLET BY MOUTH TWICE  DAILY 200 tablet 2   losartan (COZAAR) 100 MG tablet Take 1 tablet (100 mg total) by mouth daily. For blood pressure 100 tablet 1   metFORMIN (GLUCOPHAGE-XR) 500 MG 24 hr tablet Take 1 tablet (500 mg total) by mouth daily with breakfast. For your sugar 100 tablet 2   metoprolol succinate (TOPROL-XL) 50 MG 24 hr tablet TAKE 1 TABLET BY MOUTH DAILY 100 tablet 2   omeprazole (PRILOSEC) 40 MG capsule TAKE 1 CAPSULE BY MOUTH DAILY 100 capsule 2   No current facility-administered medications on file prior to visit.    Review of Systems     Objective:   Vitals:   09/08/22 1405  BP: (!) 146/60  Pulse: 62  Temp: 97.9 F (36.6 C)  SpO2: 98%   BP  Readings from Last 3 Encounters:  09/08/22 (!) 146/60  08/24/22 136/60  06/02/22 (!) 150/52   Wt Readings from Last 3 Encounters:  09/08/22 122 lb (55.3 kg)  08/24/22 121 lb (54.9 kg)  05/28/22 117 lb (53.1 kg)   Body mass index is 21.61 kg/m.    Physical Exam Constitutional:      General: She is not in acute distress.    Appearance: Normal appearance. She is not ill-appearing.  HENT:     Head: Normocephalic and atraumatic.  Skin:    General: Skin is warm and dry.     Findings: Bruising (Large bruise right lateral hand-nontender, smaller bruise left hand-nontender) present.     Comments: Gross and right forehead just less than 1 cm in size-no discoloration-nontender.  Left thumbnail three quarters of it is not attached to the base fortunately finger-new nail growing underneath.  Fingernail is still attached to the medial aspect of the finger.  No surrounding erythema.  Nontender  Neurological:     Mental Status: She is alert.            Assessment & Plan:    See Problem List for Assessment and Plan of chronic medical problems.

## 2022-09-08 NOTE — Patient Instructions (Addendum)
     See the dermatologist for your forehead

## 2022-09-08 NOTE — Assessment & Plan Note (Signed)
New She has a bruise on both hands-nontender She does not recall any significant trauma, but discussed it may have been very very mild trauma Reassured her and discussed because Discussed that she may have additional bruises similar to this in the future

## 2022-09-15 ENCOUNTER — Ambulatory Visit (INDEPENDENT_AMBULATORY_CARE_PROVIDER_SITE_OTHER): Payer: Medicare Other | Admitting: Podiatry

## 2022-09-15 VITALS — BP 135/53

## 2022-09-15 DIAGNOSIS — E118 Type 2 diabetes mellitus with unspecified complications: Secondary | ICD-10-CM

## 2022-09-15 DIAGNOSIS — M79675 Pain in left toe(s): Secondary | ICD-10-CM | POA: Diagnosis not present

## 2022-09-15 DIAGNOSIS — B351 Tinea unguium: Secondary | ICD-10-CM

## 2022-09-15 DIAGNOSIS — M79674 Pain in right toe(s): Secondary | ICD-10-CM | POA: Diagnosis not present

## 2022-09-15 NOTE — Progress Notes (Signed)
  Subjective:  Patient ID: Tami Jacobson, female    DOB: [birth year],  MRN: [id number]  Tami Jacobson presents to clinic today for preventative diabetic foot care and painful elongated mycotic toenails 1-5 bilaterally which are tender when wearing enclosed shoe gear. Pain is relieved with periodic professional debridement.  Chief Complaint  Patient presents with   Diabetes    DFC BS - DIDN'T TAKE IT A1C - 7 [facility]P - 08/2022   New problem(s): None.   PCP is Burns, Bobette Mo, MD.  Allergies  Allergen Reactions   Statins Other (See Comments)    GI UPSET   Effexor [Venlafaxine]     Constipation, shaking, dizzy   Gabapentin     drowsniess   Cephalexin Other (See Comments)    Reaction not recalled by the patient   Codeine Other (See Comments)    Reaction not recalled by the patient   Lansoprazole Other (See Comments)    Reaction not recalled by the patient   Latex Rash        Levofloxacin Other (See Comments)    Reaction not recalled by the patient   Nitrofurantoin Other (See Comments)    Reaction not recalled by the patient   Rofecoxib Other (See Comments)    Reaction not recalled by the patient     Review of Systems: Negative except as noted in the HPI.  Objective: No changes noted in today's physical examination. Vitals:   09/15/22 1421  BP: (!) 135/53   Tami Jacobson is a pleasant 89 y.o. female WD, WN in NAD. AAO x 3.  Vascular Examination: CFT <3 seconds b/l LE. Faintly palpable DP pulses b/l LE. Faintly palpable PT pulse(s) b/l LE. Pedal hair absent. No pain with calf compression b/l. Lower extremity skin temperature gradient within normal limits. No edema noted b/l LE. Mild varicosities b/l LE. No ischemia or gangrene noted b/l LE. No cyanosis or clubbing noted b/l LE.  Dermatological Examination: Pedal skin thin, shiny and atrophic b/l LE. Evidence of healing laceration distal tip left hallux and proximal medial nailbed left hallux. There is  dried heme. No erythema, no edema, no drainage, no fluctuance. No interdigital macerations noted b/l LE. Anonychia noted bilateral great toes. Nailbed(s) epithelialized.  Hyperkeratotic lesion(s) nailbed right hallux.  No erythema, no edema, no drainage, no fluctuance.  Toenails 2-5 b/l thickened, incurvated with sharp edges distally.   Neurological Examination: Protective sensation intact 5/5 intact bilaterally with 10g monofilament b/l. Vibratory sensation intact b/l.  Musculoskeletal Examination: Muscle strength 5/5 to all lower extremity muscle groups bilaterally. HAV with bunion deformity noted b/l LE. Wearing appropriate fitting shoe gear. Utilizes rollator for ambulation assistance.  Assessment/Plan: 1. Pain due to onychomycosis of toenails of both feet   2. Controlled type 2 diabetes mellitus with complication, without long-term current use of insulin ([facility])   -Consent given for treatment as described below: -Examined patient. -Continue diabetic foot care principles: inspect feet daily, monitor glucose as recommended by PCP and/or Endocrinologist, and follow prescribed diet per PCP, Endocrinologist and/or dietician. -Patient to continue soft, supportive shoe gear daily. -Toenails were debrided in length and girth 2-5 left foot and 2-5 right foot with sterile nail nippers and dremel without iatrogenic bleeding.  -Patient/POA to call should there be question/concern in the interim.   Return in about 3 months (around 12/16/2022).  Freddie Breech, DPM

## 2022-09-19 ENCOUNTER — Encounter: Payer: Self-pay | Admitting: Podiatry

## 2022-09-20 ENCOUNTER — Other Ambulatory Visit: Payer: Self-pay | Admitting: Internal Medicine

## 2022-09-20 DIAGNOSIS — Z1231 Encounter for screening mammogram for malignant neoplasm of breast: Secondary | ICD-10-CM

## 2022-09-22 DIAGNOSIS — L820 Inflamed seborrheic keratosis: Secondary | ICD-10-CM | POA: Diagnosis not present

## 2022-09-26 ENCOUNTER — Encounter: Payer: Self-pay | Admitting: Internal Medicine

## 2022-09-26 NOTE — Progress Notes (Unsigned)
Subjective:    Patient ID: Tami Jacobson, female    DOB: 01-27-1928, 89 y.o.   MRN: [id number]     HPI Tami Jacobson is here for follow up of her chronic medical problems.  Her left thumb nail fell off.    Feeling a little tired during the day.    Medications and allergies reviewed with patient and updated if appropriate.  Current Outpatient Medications on File Prior to Visit  Medication Sig Dispense Refill   acetaminophen (TYLENOL 8 HOUR) 650 MG CR tablet Take 2 tablets every 8 hours by oral route.     amLODipine (NORVASC) 2.5 MG tablet Take 1 tablet (2.5 mg total) by mouth daily. For blood pressure 100 tablet 1   clobetasol cream (TEMOVATE) 0.05 % APPLY CREAM TOPICALLY TWICE DAILY AS NEEDED FOR IRRITATION FROM LICHEN SCLEROSUS     cyanocobalamin (,VITAMIN B-12,) 1000 MCG/ML injection Inject 1,000 mcg into the muscle every 30 (thirty) days.     flecainide (TAMBOCOR) 50 MG tablet TAKE 1 TABLET BY MOUTH TWICE  DAILY 200 tablet 2   losartan (COZAAR) 100 MG tablet Take 1 tablet (100 mg total) by mouth daily. For blood pressure 100 tablet 1   metFORMIN (GLUCOPHAGE-XR) 500 MG 24 hr tablet Take 1 tablet (500 mg total) by mouth daily with breakfast. For your sugar 100 tablet 2   metoprolol succinate (TOPROL-XL) 50 MG 24 hr tablet TAKE 1 TABLET BY MOUTH DAILY 100 tablet 2   omeprazole (PRILOSEC) 40 MG capsule TAKE 1 CAPSULE BY MOUTH DAILY 100 capsule 2   No current facility-administered medications on file prior to visit.     Review of Systems  Constitutional:  Negative for fever.  Respiratory:  Negative for cough, shortness of breath and wheezing.   Cardiovascular:  Negative for chest pain, palpitations and leg swelling.  Gastrointestinal:        Occ gerd  Neurological:  Positive for light-headedness (if stands up quickly - occasionally). Negative for headaches.       Objective:   Vitals:   09/27/22 1053  BP: 118/62  Pulse: 64  Temp: 98.5 F (36.9 C)  SpO2: 95%    BP Readings from Last 3 Encounters:  09/27/22 118/62  09/15/22 (!) 135/53  09/08/22 128/78   Wt Readings from Last 3 Encounters:  09/27/22 121 lb (54.9 kg)  09/08/22 122 lb (55.3 kg)  08/24/22 121 lb (54.9 kg)   Body mass index is 21.43 kg/m.    Physical Exam Constitutional:      General: She is not in acute distress.    Appearance: Normal appearance.  HENT:     Head: Normocephalic and atraumatic.  Eyes:     Conjunctiva/sclera: Conjunctivae normal.  Cardiovascular:     Rate and Rhythm: Normal rate and regular rhythm.     Heart sounds: Normal heart sounds.  Pulmonary:     Effort: Pulmonary effort is normal. No respiratory distress.     Breath sounds: Normal breath sounds. No wheezing.  Musculoskeletal:     Cervical back: Neck supple.     Right lower leg: No edema.     Left lower leg: No edema.  Lymphadenopathy:     Cervical: No cervical adenopathy.  Skin:    General: Skin is warm and dry.     Findings: No rash.  Neurological:     Mental Status: She is alert. Mental status is at baseline.  Psychiatric:        Mood and Affect:  Mood normal.        Behavior: Behavior normal.        Lab Results  Component Value Date   WBC 6.7 03/24/2022   HGB 11.4 (L) 03/24/2022   HCT 32.4 (L) 03/24/2022   PLT 181.0 03/24/2022   GLUCOSE 176 (H) 03/05/2022   CHOL 173 02/22/2019   TRIG 157.0 (H) 02/22/2019   HDL 50.40 02/22/2019   LDLDIRECT 128.3 12/26/2012   LDLCALC 92 02/22/2019   ALT 13 03/05/2022   AST 24 03/05/2022   NA 129 (L) 03/05/2022   K 3.7 03/05/2022   CL 96 (L) 03/05/2022   CREATININE 0.74 03/05/2022   BUN 12 03/05/2022   CO2 26 03/05/2022   TSH 2.519 05/15/2019   HGBA1C 6.7 (H) 11/11/2021   MICROALBUR 1.0 08/23/2018     Assessment & Plan:    See Problem List for Assessment and Plan of chronic medical problems.

## 2022-09-26 NOTE — Patient Instructions (Addendum)
    B12 injection today    Blood work was ordered.   The lab is on the first floor.     Medications changes include :   none     Return in about 6 months (around 03/29/2023) for Physical Exam, move up october nurse visit to mid-September.

## 2022-09-27 ENCOUNTER — Ambulatory Visit (INDEPENDENT_AMBULATORY_CARE_PROVIDER_SITE_OTHER): Payer: Medicare Other | Admitting: Internal Medicine

## 2022-09-27 VITALS — BP 118/62 | HR 64 | Temp 98.5°F | Ht 63.0 in | Wt 121.0 lb

## 2022-09-27 DIAGNOSIS — E538 Deficiency of other specified B group vitamins: Secondary | ICD-10-CM

## 2022-09-27 DIAGNOSIS — I482 Chronic atrial fibrillation, unspecified: Secondary | ICD-10-CM

## 2022-09-27 DIAGNOSIS — E118 Type 2 diabetes mellitus with unspecified complications: Secondary | ICD-10-CM | POA: Diagnosis not present

## 2022-09-27 DIAGNOSIS — I10 Essential (primary) hypertension: Secondary | ICD-10-CM

## 2022-09-27 DIAGNOSIS — E785 Hyperlipidemia, unspecified: Secondary | ICD-10-CM | POA: Diagnosis not present

## 2022-09-27 DIAGNOSIS — Z7984 Long term (current) use of oral hypoglycemic drugs: Secondary | ICD-10-CM | POA: Diagnosis not present

## 2022-09-27 DIAGNOSIS — I5032 Chronic diastolic (congestive) heart failure: Secondary | ICD-10-CM

## 2022-09-27 DIAGNOSIS — K219 Gastro-esophageal reflux disease without esophagitis: Secondary | ICD-10-CM | POA: Diagnosis not present

## 2022-09-27 LAB — COMPREHENSIVE METABOLIC PANEL
ALT: 11 U/L (ref 0–35)
AST: 16 U/L (ref 0–37)
Albumin: 4 g/dL (ref 3.5–5.2)
Alkaline Phosphatase: 66 U/L (ref 39–117)
BUN: 13 mg/dL (ref 6–23)
CO2: 29 mEq/L (ref 19–32)
Calcium: 10.1 mg/dL (ref 8.4–10.5)
Chloride: 104 mEq/L (ref 96–112)
Creatinine, Ser: 0.97 mg/dL (ref 0.40–1.20)
GFR: 50.57 mL/min — ABNORMAL LOW (ref >60.00)
Glucose, Bld: 126 mg/dL — ABNORMAL HIGH (ref 70–99)
Potassium: 4.6 mEq/L (ref 3.5–5.1)
Sodium: 138 mEq/L (ref 135–145)
Total Bilirubin: 0.6 mg/dL (ref 0.2–1.2)
Total Protein: 7.2 g/dL (ref 6.0–8.3)

## 2022-09-27 LAB — CBC WITH DIFFERENTIAL/PLATELET
Basophils Absolute: 0 10*3/uL (ref 0.0–0.1)
Basophils Relative: 0.5 % (ref 0.0–3.0)
Eosinophils Absolute: 0.2 10*3/uL (ref 0.0–0.7)
Eosinophils Relative: 3.3 % (ref 0.0–5.0)
HCT: 32.7 % — ABNORMAL LOW (ref 36.0–46.0)
Hemoglobin: 11.2 g/dL — ABNORMAL LOW (ref 12.0–15.0)
Lymphocytes Relative: 20.7 % (ref 12.0–46.0)
Lymphs Abs: 1.1 10*3/uL (ref 0.7–4.0)
MCHC: 34.2 g/dL (ref 30.0–36.0)
MCV: 93.7 fl (ref 78.0–100.0)
Monocytes Absolute: 0.6 10*3/uL (ref 0.1–1.0)
Monocytes Relative: 11.5 % (ref 3.0–12.0)
Neutro Abs: 3.5 10*3/uL (ref 1.4–7.7)
Neutrophils Relative %: 64 % (ref 43.0–77.0)
Platelets: 173 10*3/uL (ref 150.0–400.0)
RBC: 3.49 Mil/uL — ABNORMAL LOW (ref 3.87–5.11)
RDW: 13.8 % (ref 11.5–15.5)
WBC: 5.5 10*3/uL (ref 4.0–10.5)

## 2022-09-27 LAB — HEMOGLOBIN A1C: Hgb A1c MFr Bld: 6.9 % — ABNORMAL HIGH (ref 4.6–6.5)

## 2022-09-27 MED ORDER — CYANOCOBALAMIN 1000 MCG/ML IJ SOLN
1000.0000 ug | Freq: Once | INTRAMUSCULAR | Status: AC
Start: 2022-09-27 — End: 2022-09-27
  Administered 2022-09-27: 1000 ug via INTRAMUSCULAR

## 2022-09-27 NOTE — Assessment & Plan Note (Signed)
Chronic GERD controlled Continue omeprazole 40 mg daily 

## 2022-09-27 NOTE — Assessment & Plan Note (Signed)
Chronic   Lab Results  Component Value Date   HGBA1C 6.7 (H) 11/11/2021   Sugars well controlled Check A1c Continue metformin XR 500 mg daily Encouraged regular exercise, diabetic diet

## 2022-09-27 NOTE — Assessment & Plan Note (Addendum)
Chronic Has been on B12 injections for a long time due to having poor oral absorption of iron Continue monthly B12 injections B12 injection due today-given

## 2022-09-27 NOTE — Assessment & Plan Note (Addendum)
Chronic BP well controlled Continue amodipine 2.5 mg daily, losartan 100 mg daily, metoprolol xl 50 mg daily  

## 2022-09-27 NOTE — Assessment & Plan Note (Signed)
Chronic Follows with cardiology-Dr. Jens Som On flecainide, metoprolol XL 50 mg daily

## 2022-09-27 NOTE — Assessment & Plan Note (Signed)
Chronic Euvolemic Continue metoprolol XL 25 mg daily

## 2022-09-27 NOTE — Assessment & Plan Note (Signed)
Chronic Check lipid panel  Controlled with lifestyle - intolerant of statins and PCSK9  Regular exercise and healthy diet encouraged  

## 2022-10-06 ENCOUNTER — Telehealth: Payer: Self-pay | Admitting: Internal Medicine

## 2022-10-06 ENCOUNTER — Ambulatory Visit (INDEPENDENT_AMBULATORY_CARE_PROVIDER_SITE_OTHER): Payer: Medicare Other

## 2022-10-06 DIAGNOSIS — Z0000 Encounter for general adult medical examination without abnormal findings: Secondary | ICD-10-CM

## 2022-10-06 NOTE — Telephone Encounter (Signed)
She will be an article stating the possible side effects of statins including dry eyes and dry mouth.  Patient wondered if some of her medications were causing her dry mouth/lips.  Yes that is possible.  Many medications potentially cause dry mouth.  Of the ones she is taking probably the most common to do that would be the metoprolol and flecainide-continue current medications.

## 2022-10-06 NOTE — Progress Notes (Signed)
Subjective:   Tami Jacobson is a 89 y.o. female who presents for Medicare Annual (Subsequent) preventive examination.  Visit Complete: Virtual  I connected with  Tami Jacobson on 10/06/22 by a audio enabled telemedicine application and verified that I am speaking with the correct person using two identifiers.  Patient Location: Home, But in an apartment at a Assistant Living Facility   Provider Location: Office/Clinic  I discussed the limitations of evaluation and management by telemedicine. The patient expressed understanding and agreed to proceed.  Review of Systems    Defer to PCP Cardiac Risk Factors include: advanced age (>76men, >30 women);diabetes mellitus;dyslipidemia;hypertension     Objective:    Today's Vitals   10/06/22 1204  PainSc: 0-No pain   There is no height or weight on file to calculate BMI.     10/06/2022   12:22 PM 03/05/2022    5:39 PM 02/17/2022    1:32 PM 06/09/2021    2:04 PM 05/06/2020   12:49 PM 05/15/2019   10:08 AM 05/15/2019   10:00 AM  Advanced Directives  Does Patient Have a Medical Advance Directive? Yes No No Yes Yes  No  Type of Estate agent of [employer] Power of [place];Living will Healthcare Power of [place];Living will    Does patient want to make changes to medical advance directive? No - Patient declined   No - Patient declined No - Patient declined    Copy of Healthcare Power of Attorney in Chart? No - copy requested   Yes - validated most recent copy scanned in chart (See row information) No - copy requested    Would patient like information on creating a medical advance directive?  No - Patient declined    No - Patient declined     Current Medications (verified) Outpatient Encounter Medications as of 10/06/2022  Medication Sig   acetaminophen (TYLENOL 8 HOUR) 650 MG CR tablet Take 2 tablets every 8 hours by oral route.   amLODipine (NORVASC) 2.5 MG tablet Take 1 tablet (2.5 mg total) by  mouth daily. For blood pressure   clobetasol cream (TEMOVATE) 0.05 % APPLY CREAM TOPICALLY TWICE DAILY AS NEEDED FOR IRRITATION FROM LICHEN SCLEROSUS   cyanocobalamin (,VITAMIN B-12,) 1000 MCG/ML injection Inject 1,000 mcg into the muscle every 30 (thirty) days.   flecainide (TAMBOCOR) 50 MG tablet TAKE 1 TABLET BY MOUTH TWICE  DAILY   losartan (COZAAR) 100 MG tablet Take 1 tablet (100 mg total) by mouth daily. For blood pressure   metFORMIN (GLUCOPHAGE-XR) 500 MG 24 hr tablet Take 1 tablet (500 mg total) by mouth daily with breakfast. For your sugar   metoprolol succinate (TOPROL-XL) 50 MG 24 hr tablet TAKE 1 TABLET BY MOUTH DAILY   omeprazole (PRILOSEC) 40 MG capsule TAKE 1 CAPSULE BY MOUTH DAILY   No facility-administered encounter medications on file as of 10/06/2022.    Allergies (verified) Statins, Effexor [venlafaxine], Gabapentin, Cephalexin, Codeine, Lansoprazole, Latex, Levofloxacin, Nitrofurantoin, and Rofecoxib   History: Past Medical History:  Diagnosis Date   Arthritis    left hip   Breast cancer, right (HCC) 11/2016   Family history of breast cancer    Family history of colon cancer    Family history of pancreatic cancer    GERD (gastroesophageal reflux disease)    Non-insulin dependent type 2 diabetes mellitus (HCC)    PAC (premature atrial contraction)    Personal history of radiation therapy 12/2016   Premature ventricular contraction  Seasonal allergies    Sensitive skin    Vitamin B12 deficiency    Past Surgical History:  Procedure Laterality Date   ABDOMINAL HYSTERECTOMY  1979   partial   BREAST BIOPSY  07/02/2011   Procedure: BREAST BIOPSY WITH NEEDLE LOCALIZATION;  Surgeon: Kandis Cocking, MD;  Location[facility];  Service: General;  Laterality: Left;  left breast atypical hyperplasia needle localization biopsy   BREAST BIOPSY Left 06/13/2006   BREAST BIOPSY Right 06/27/2008   BREAST BIOPSY Right 09/27/2016   BREAST EXCISIONAL BIOPSY Left 07/02/2011    BREAST LUMPECTOMY Left 07/04/2006   BREAST LUMPECTOMY Right 06/26/2008   BREAST LUMPECTOMY Right 09/27/2016   BREAST LUMPECTOMY WITH RADIOACTIVE SEED LOCALIZATION Right 11/18/2016   Procedure: RIGHT BREAST LUMPECTOMY WITH RADIOACTIVE SEED LOCALIZATION ERAS PATHWAY;  Surgeon: Ovidio Kin, MD;  Location: [facility];  Service: General;  Laterality: Right;  ERAS PATHWAY   CATARACT EXTRACTION Bilateral    CHOLECYSTECTOMY, LAPAROSCOPIC  07/07/2001   had appendex taking out at the same time   ESOPHAGEAL DILATION  2006   LAPAROSCOPIC APPENDECTOMY  07/07/2001   TONSILLECTOMY  1942   Family History  Problem Relation Age of Onset   Colon cancer Mother 54   Heart attack Mother    Breast cancer Sister 56   Heart disease Maternal Uncle        CABG   Breast cancer Sister 93   Heart disease Brother    Other Father        suicide   Breast cancer Sister 3   Heart disease Sister    COPD Brother    Heart disease Brother    Breast cancer Other 51       niece - brother's daughter   Pancreatic cancer Other        brother's son, dx in his 55s   Social History   Socioeconomic History   Marital status: Divorced    Spouse name: Maisie Fus   Number of children: 1   Years of education: Not on file   Highest education level: Not on file  Occupational History   Occupation: retire  Tobacco Use   Smoking status: Never   Smokeless tobacco: Never  Vaping Use   Vaping Use: Never used  Substance and Sexual Activity   Alcohol use: No    Alcohol/week: 0.0 standard drinks of alcohol   Drug use: No   Sexual activity: Not Currently  Other Topics Concern   Not on file  Social History Narrative   Not on file   Social Determinants of Health   Financial Resource Strain: Low Risk  (10/06/2022)   Overall Financial Resource Strain (CARDIA)    Difficulty of Paying Living Expenses: Not hard at all  Food Insecurity: No Food Insecurity (10/06/2022)   Hunger Vital Sign    Worried About Running Out  of Food in the Last Year: Never true    Ran Out of Food in the Last Year: Never true  Transportation Needs: No Transportation Needs (10/06/2022)   PRAPARE - Administrator, Civil Service (Medical): No    Lack of Transportation (Non-Medical): No  Physical Activity: Inactive (10/06/2022)   Exercise Vital Sign    Days of Exercise per Week: 0 days    Minutes of Exercise per Session: 0 min  Stress: No Stress Concern Present (10/06/2022)   Tami Jacobson of Occupational Health - Occupational Stress Questionnaire    Feeling of Stress : Not at all  Social Connections:  Moderately Integrated (10/06/2022)   Social Connection and Isolation Panel [NHANES]    Frequency of Communication with Friends and Family: Three times a week    Frequency of Social Gatherings with Friends and Family: Three times a week    Attends Religious Services: More than 4 times per year    Active Member of Clubs or Organizations: Yes    Attends Banker Meetings: More than 4 times per year    Marital Status: Widowed    Tobacco Counseling Counseling given: Not Answered   Clinical Intake:  Pre-visit preparation completed: Yes  Pain : 0-10 Pain Score: 0-No pain     BMI - recorded: 21.44 Nutritional Status: BMI of 19-24  Normal Nutritional Risks: None Diabetes: Yes CBG done?: No (pt was not in office) Did pt. bring in CBG monitor from home?: No (pt not in office)  Nutrition Risk Assessment:  Has the patient had any N/V/D within the last 2 months?  Yes  pt stated she had D Does the patient have any non-healing wounds?  No  Has the patient had any unintentional weight loss or weight gain?  No   Diabetes:  Is the patient diabetic?  Yes  If diabetic, was a CBG obtained today?  No  Did the patient bring in their glucometer from home?   N/A How often do you monitor your CBG's? Every two weeks.   Financial Strains and Diabetes Management:  Are you having any financial strains with the  device, your supplies or your medication? No .  Does the patient want to be seen by Chronic Care Management for management of their diabetes?  No  Would the patient like to be referred to a Nutritionist or for Diabetic Management?  No   Diabetic Exams:  Diabetic Eye Exam: Completed 12/03/21 Diabetic Foot Exam: Completed 09/15/22   How often do you need to have someone help you when you read instructions, pamphlets, or other written materials from your doctor or pharmacy?: 1 - Never What is the last grade level you completed in school?: 12th grade  Interpreter Needed?: No      Activities of Daily Living    10/06/2022   12:10 PM  In your present state of health, do you have any difficulty performing the following activities:  Hearing? 0  Vision? 0  Difficulty concentrating or making decisions? 0  Walking or climbing stairs? 0  Dressing or bathing? 0  Doing errands, shopping? 0  Preparing Food and eating ? N  Using the Toilet? N  In the past six months, have you accidently leaked urine? N  Do you have problems with loss of bowel control? N  Managing your Medications? N  Managing your Finances? N  Housekeeping or managing your Housekeeping? N  Comment pt is in assistant living    Patient Care Team: Pincus Sanes, MD as PCP - General (Internal Medicine) Jens Som Madolyn Frieze, MD as Consulting Physician (Cardiology) Magrinat, Valentino Hue, MD (Inactive) as Consulting Physician (Hematology and Oncology) Tracey Harries, MD as Consulting Physician (Obstetrics and Gynecology) Ovidio Kin, MD as Consulting Physician (General Surgery) Drema Halon, MD (Inactive) as Consulting Physician (Otolaryngology) Dorothy Puffer, MD as Consulting Physician (Radiation Oncology) Axel Filler Larna Daughters, NP as Nurse Practitioner (Hematology and Oncology) Maris Berger, MD as Consulting Physician (Ophthalmology) Kathyrn Sheriff, [facility] (Inactive) as Pharmacist (Pharmacist)  Indicate any  recent Medical Services you may have received from other than [facility] providers in the past year (date may be approximate).  Assessment:   This is a routine wellness examination for Tami Jacobson.  Hearing/Vision screen Vision Screening - Comments:: Pt sees Eye provider   Dietary issues and exercise activities discussed:     Goals Addressed               This Visit's Progress     Patient Stated (pt-stated)        She wants to be able to walk more, do better what she currently is.      Depression Screen    10/06/2022   12:22 PM 09/27/2022   11:08 AM 05/28/2022   10:10 AM 02/23/2022    2:33 PM 11/11/2021    1:37 PM 06/09/2021    1:49 PM 05/06/2020   12:48 PM  PHQ 2/9 Scores  PHQ - 2 Score 0 0 0 0 0 0 0  PHQ- 9 Score     0      Fall Risk    10/06/2022   12:23 PM 09/27/2022   11:08 AM 05/28/2022   10:09 AM 03/01/2022    2:26 PM 02/23/2022    2:33 PM  Fall Risk   Falls in the past year? 0 0 0 1 1  Comment pt dose use a walker      Number falls in past yr: 0 0 0 0 1  Injury with Fall? 0 0 0 1 1  Risk for fall due to : No Fall Risks No Fall Risks No Fall Risks No Fall Risks History of fall(s)  Follow up Falls evaluation completed Falls evaluation completed Falls evaluation completed Falls evaluation completed Falls evaluation completed    MEDICARE RISK AT HOME:  Medicare Risk at Home - 10/06/22 1223     Any stairs in or around the home? Yes    If so, are there any without handrails? Yes    Home free of loose throw rugs in walkways, pet beds, electrical cords, etc? Yes    Adequate lighting in your home to reduce risk of falls? Yes    Life alert? No    Use of a cane, walker or w/c? Yes   has cane and walker, but use walker   Grab bars in the bathroom? Yes    Shower chair or bench in shower? Yes   yes, pt have an assistant always on standby   Elevated toilet seat or a handicapped toilet? Yes             TIMED UP AND GO:  Was the test performed?  No    Cognitive  Function:    01/30/2020    2:02 PM 09/22/2017   12:03 PM 09/27/2016    8:50 AM  MMSE - Mini Mental State Exam  Not completed:   Refused  Orientation to time 5 5   Orientation to Place 5 5   Registration 3 3   Attention/ Calculation 5 5   Recall 3 3   Language- name 2 objects 2 2   Language- repeat 1 1   Language- follow 3 step command 2 3   Language- read & follow direction 0 1   Write a sentence 1 1   Copy design 0 1   Total score 27 30         10/06/2022   12:25 PM  6CIT Screen  What Year? 0 points  What month? 0 points  What time? 3 points  Count back from 20 0 points  Months in reverse 0 points  Repeat phrase 0  points  Total Score 3 points    Immunizations Immunization History  Administered Date(s) Administered   Fluad Quad(high Dose 65+) 01/04/2019, 01/07/2020, 12/09/2020, 12/18/2021   Influenza Split 12/14/2011   Influenza Whole 02/01/2007, 01/03/2009, 12/15/2010   Influenza, High Dose Seasonal PF 01/01/2014, 12/25/2014, 12/15/2015, 12/24/2016, 12/27/2017   Influenza,inj,Quad PF,6+ Mos 12/26/2012   Moderna Sars-Covid-2 Vaccination 06/03/2019, 06/30/2019, 03/07/2020   Pneumococcal Conjugate-13 07/02/2014   Pneumococcal Polysaccharide-23 02/16/2002, 02/09/2012   Td 06/10/2009    TDAP status: Due, Education has been provided regarding the importance of this vaccine. Advised may receive this vaccine at local pharmacy or Health Dept. Aware to provide a copy of the vaccination record if obtained from local pharmacy or Health Dept. Verbalized acceptance and understanding.  Flu Vaccine status: Up to date  Pneumococcal vaccine status: Up to date  Covid-19 vaccine status: Completed vaccines  Qualifies for Shingles Vaccine? No   Zostavax completed No   Shingrix Completed?: No.    Education has been provided regarding the importance of this vaccine. Patient has been advised to call insurance company to determine out of pocket expense if they have not yet received this  vaccine. Advised may also receive vaccine at local pharmacy or Health Dept. Verbalized acceptance and understanding.  Screening Tests Health Maintenance  Topic Date Due   Zoster Vaccines- Shingrix (1 of 2) Never done   DTaP/Tdap/Td (2 - Tdap) 06/11/2019   COVID-19 Vaccine (4 - 2023-24 season) 12/04/2021   INFLUENZA VACCINE  11/04/2022   OPHTHALMOLOGY EXAM  12/04/2022   FOOT EXAM  09/15/2023   Medicare Annual Wellness (AWV)  10/06/2023   Pneumonia Vaccine 5+ Years old  Completed   DEXA SCAN  Completed   HPV VACCINES  Aged Out    Health Maintenance  Health Maintenance Due  Topic Date Due   Zoster Vaccines- Shingrix (1 of 2) Never done   DTaP/Tdap/Td (2 - Tdap) 06/11/2019   COVID-19 Vaccine (4 - 2023-24 season) 12/04/2021    Colorectal cancer screening: Type of screening: Colonoscopy. Completed 11/13/2009. Repeat every 10 years  Mammogram status: No longer required due to N/A.  Bone Density status: Completed 08/10/2010. Results reflect: Bone density results: NORMAL. Repeat every N/A years.  Lung Cancer Screening: (Low Dose CT Chest recommended if Age 38-80 years, 20 pack-year currently smoking OR have quit w/in 15years.) does not qualify.   Lung Cancer Screening Referral: N/A  Additional Screening:  Hepatitis C Screening: does not qualify; Completed N/A  Vision Screening: Recommended annual ophthalmology exams for early detection of glaucoma and other disorders of the eye. Is the patient up to date with their annual eye exam?  Yes  Who is the provider or what is the name of the office in which the patient attends annual eye exams? [facility]  If pt is not established with a provider, would they like to be referred to a provider to establish care? No .   Dental Screening: Recommended annual dental exams for proper oral hygiene  Diabetic Foot Exam: Diabetic Foot Exam: Completed 10/06/22  Community Resource Referral / Chronic Care Management: CRR required this visit?  No    CCM required this visit?  No     Plan:     I have personally reviewed and noted the following in the patient's chart:   Medical and social history Use of alcohol, tobacco or illicit drugs  Current medications and supplements including opioid prescriptions. Patient is not currently taking opioid prescriptions. Functional ability and status Nutritional status Physical activity Advanced directives  List of other physicians Hospitalizations, surgeries, and ER visits in previous 12 months Vitals Screenings to include cognitive, depression, and falls Referrals and appointments  In addition, I have reviewed and discussed with patient certain preventive protocols, quality metrics, and best practice recommendations. A written personalized care plan for preventive services as well as general preventive health recommendations were provided to patient.     Young Berry Danessa Mensch, CMA   10/06/2022   After Visit Summary: (Mail) Due to this being a telephonic visit, the after visit summary with patients personalized plan was offered to patient via mail   Nurse Notes:  Tami Jacobson , Thank you for taking time to come for your Medicare Wellness Visit. I appreciate your ongoing commitment to your health goals. Please review the following plan we discussed and let me know if I can assist you in the future.   These are the goals we discussed:  Goals       Client understands the importance of follow-up with providers by attending scheduled visits      My goal is to rejoin the [employer] to get more physically active and I would love to join [employer].      Maintian current health status      Continue to eat healthy, stay active and independent as much as possible      Manage My Medicine      Timeframe:  Long-Range Goal Priority:  Medium Start Date:  03/09/2021                           Expected End Date:  03/09/2022                     Follow Up Date 09/07/2021   - call for medicine refill 2 or 3  days before it runs out - call if I am sick and can't take my medicine - keep a list of all the medicines I take; vitamins and herbals too - learn to read medicine labels    Why is this important?   These steps will help you keep on track with your medicines.      Patient Stated      I want to continue to do the crossword puzzle and read as much as possible. I will stay socially active by going to church and participating in some of my churches' activity.       Patient Stated      Continue to be independent and socially active.      Patient Stated (pt-stated)      She wants to be able to walk more, do better what she currently is.        This is a list of the screening recommended for you and due dates:  Health Maintenance  Topic Date Due   Zoster (Shingles) Vaccine (1 of 2) Never done   DTaP/Tdap/Td vaccine (2 - Tdap) 06/11/2019   COVID-19 Vaccine (4 - 2023-24 season) 12/04/2021   Flu Shot  11/04/2022   Eye exam for diabetics  12/04/2022   Complete foot exam   09/15/2023   Medicare Annual Wellness Visit  10/06/2023   Pneumonia Vaccine  Completed   DEXA scan (bone density measurement)  Completed   HPV Vaccine  Aged Out

## 2022-10-08 NOTE — Telephone Encounter (Signed)
Spoke with patient today and info given. 

## 2022-10-25 ENCOUNTER — Ambulatory Visit: Payer: Medicare Other | Admitting: Family Medicine

## 2022-11-02 ENCOUNTER — Ambulatory Visit
Admission: RE | Admit: 2022-11-02 | Discharge: 2022-11-02 | Disposition: A | Discharge status: Home / Self Care | Payer: Medicare Other | Source: Ambulatory Visit | Attending: Internal Medicine | Admitting: Internal Medicine

## 2022-11-02 DIAGNOSIS — Z1231 Encounter for screening mammogram for malignant neoplasm of breast: Secondary | ICD-10-CM

## 2022-11-02 HISTORY — DX: Malignant neoplasm of unspecified site of unspecified female breast: C50.919

## 2022-11-08 DIAGNOSIS — L603 Nail dystrophy: Secondary | ICD-10-CM | POA: Diagnosis not present

## 2022-11-12 NOTE — Progress Notes (Signed)
HPI: FU palpitations and cerebrovascular disease; history of PACs and PVCs with normal LV function. She had significant palpitations in the past despite beta-blockade. She is therefore on flecainide 50 mg p.o. b.i.d. A previous catheterization in June 2000 showed normal coronary arteries and normal LV function. Nuclear study February 2017 showed ejection fraction 72% and normal perfusion. Carotid dopplers 2/18 showed 1-39 bilateral stenosis. Chest CT April 2021 showed biapical scarring and coronary artery calcification. Echocardiogram December 2021 showed normal LV function, severe left ventricular hypertrophy, grade 2 diastolic dysfunction, mild left atrial enlargement, mild mitral regurgitation.  Since I last saw her, she denies dyspnea, chest pain, palpitations or syncope.  Current Outpatient Medications  Medication Sig Dispense Refill   acetaminophen (TYLENOL 8 HOUR) 650 MG CR tablet Take 2 tablets every 8 hours by oral route.     amLODipine (NORVASC) 2.5 MG tablet Take 1 tablet (2.5 mg total) by mouth daily. For blood pressure 100 tablet 1   clobetasol cream (TEMOVATE) 0.05 % APPLY CREAM TOPICALLY TWICE DAILY AS NEEDED FOR IRRITATION FROM LICHEN SCLEROSUS     cyanocobalamin (,VITAMIN B-12,) 1000 MCG/ML injection Inject 1,000 mcg into the muscle every 30 (thirty) days.     flecainide (TAMBOCOR) 50 MG tablet TAKE 1 TABLET BY MOUTH TWICE  DAILY 200 tablet 2   losartan (COZAAR) 100 MG tablet Take 1 tablet (100 mg total) by mouth daily. For blood pressure 100 tablet 1   metFORMIN (GLUCOPHAGE-XR) 500 MG 24 hr tablet Take 1 tablet (500 mg total) by mouth daily with breakfast. For your sugar 100 tablet 2   metoprolol succinate (TOPROL-XL) 50 MG 24 hr tablet TAKE 1 TABLET BY MOUTH DAILY 100 tablet 2   omeprazole (PRILOSEC) 40 MG capsule TAKE 1 CAPSULE BY MOUTH DAILY 100 capsule 2   No current facility-administered medications for this visit.     Past Medical History:  Diagnosis Date    Arthritis    left hip   Breast cancer (HCC)    Breast cancer, right (HCC) 11/2016   Family history of breast cancer    Family history of colon cancer    Family history of pancreatic cancer    GERD (gastroesophageal reflux disease)    Non-insulin dependent type 2 diabetes mellitus (HCC)    PAC (premature atrial contraction)    Personal history of radiation therapy 12/2016   Premature ventricular contraction    Seasonal allergies    Sensitive skin    Vitamin B12 deficiency     Past Surgical History:  Procedure Laterality Date   ABDOMINAL HYSTERECTOMY  1979   partial   BREAST BIOPSY  07/02/2011   Procedure: BREAST BIOPSY WITH NEEDLE LOCALIZATION;  Surgeon: Kandis Cocking, MD;  Location: [facility] OR;  Service: General;  Laterality: Left;  left breast atypical hyperplasia needle localization biopsy   BREAST BIOPSY Left 06/13/2006   BREAST BIOPSY Right 06/27/2008   BREAST BIOPSY Right 09/27/2016   BREAST EXCISIONAL BIOPSY Left 07/02/2011   BREAST LUMPECTOMY Left 07/04/2006   BREAST LUMPECTOMY Right 06/26/2008   BREAST LUMPECTOMY Right 09/27/2016   BREAST LUMPECTOMY WITH RADIOACTIVE SEED LOCALIZATION Right 11/18/2016   Procedure: RIGHT BREAST LUMPECTOMY WITH RADIOACTIVE SEED LOCALIZATION ERAS PATHWAY;  Surgeon: Ovidio Kin, MD;  Location: [facility];  Service: General;  Laterality: Right;  ERAS PATHWAY   CATARACT EXTRACTION Bilateral    CHOLECYSTECTOMY, LAPAROSCOPIC  07/07/2001   had appendex taking out at the same time   ESOPHAGEAL DILATION  2006  LAPAROSCOPIC APPENDECTOMY  07/07/2001   TONSILLECTOMY  1942    Social History   Socioeconomic History   Marital status: Divorced    Spouse name: Maisie Fus   Number of children: 1   Years of education: Not on file   Highest education level: Not on file  Occupational History   Occupation: retire  Tobacco Use   Smoking status: Never   Smokeless tobacco: Never  Vaping Use   Vaping status: Never Used  Substance and Sexual  Activity   Alcohol use: No    Alcohol/week: 0.0 standard drinks of alcohol   Drug use: No   Sexual activity: Not Currently  Other Topics Concern   Not on file  Social History Narrative   Not on file   Social Determinants of Health   Financial Resource Strain: Low Risk  (10/06/2022)   Overall Financial Resource Strain (CARDIA)    Difficulty of Paying Living Expenses: Not hard at all  Food Insecurity: No Food Insecurity (10/06/2022)   Hunger Vital Sign    Worried About Running Out of Food in the Last Year: Never true    Ran Out of Food in the Last Year: Never true  Transportation Needs: No Transportation Needs (10/06/2022)   PRAPARE - Administrator, Civil Service (Medical): No    Lack of Transportation (Non-Medical): No  Physical Activity: Inactive (10/06/2022)   Exercise Vital Sign    Days of Exercise per Week: 0 days    Minutes of Exercise per Session: 0 min  Stress: No Stress Concern Present (10/06/2022)   [employer] of Occupational Health - Occupational Stress Questionnaire    Feeling of Stress : Not at all  Social Connections: Moderately Integrated (10/06/2022)   Social Connection and Isolation Panel [NHANES]    Frequency of Communication with Friends and Family: Three times a week    Frequency of Social Gatherings with Friends and Family: Three times a week    Attends Religious Services: More than 4 times per year    Active Member of Clubs or Organizations: Yes    Attends Banker Meetings: More than 4 times per year    Marital Status: Widowed  Intimate Partner Violence: Not At Risk (10/06/2022)   Humiliation, Afraid, Rape, and Kick questionnaire    Fear of Current or Ex-Partner: No    Emotionally Abused: No    Physically Abused: No    Sexually Abused: No    Family History  Problem Relation Age of Onset   Colon cancer Mother 33   Heart attack Mother    Breast cancer Sister 84   Heart disease Maternal Uncle        CABG   Breast cancer Sister 69    Heart disease Brother    Other Father        suicide   Breast cancer Sister 64   Heart disease Sister    COPD Brother    Heart disease Brother    Breast cancer Other 73       niece - brother's daughter   Pancreatic cancer Other        brother's son, dx in his 61s    ROS: no fevers or chills, productive cough, hemoptysis, dysphasia, odynophagia, melena, hematochezia, dysuria, hematuria, rash, seizure activity, orthopnea, PND, pedal edema, claudication. Remaining systems are negative.  Physical Exam: Well-developed well-nourished in no acute distress.  Skin is warm and dry.  HEENT is normal.  Neck is supple.  Chest is clear to auscultation  with normal expansion.  Cardiovascular exam is regular rate and rhythm.  Abdominal exam nontender or distended. No masses palpated. Extremities show no edema. neuro grossly intact  EKG Interpretation Date/Time:  Monday November 22 2022 10:45:09 EDT Ventricular Rate:  62 PR Interval:  224 QRS Duration:  78 QT Interval:  410 QTC Calculation: 416 R Axis:   -56  Text Interpretation: Sinus rhythm with 1st degree A-V block Left axis deviation When compared with ECG of 05-Mar-2022 18:17, PREVIOUS ECG IS PRESENT Confirmed by Olga Millers ([id number]) on 11/22/2022 10:47:20 AM    A/P  1 coronary artery disease-based on previous CT demonstrating coronary calcification.  Patient denies chest pain.  Continue aspirin.  Intolerant to statins.  2 hypertension-blood pressure controlled.  Continue present medications.  3 palpitations-previous monitor showed PACs and PVCs.  Continue beta-blocker and flecainide.  As outlined in previous notes her palpitations were not controlled with beta-blockade alone but did improve with flecainide.  She has coronary calcification noted on previous CT but has never had documented coronary disease and has tolerated flecainide for years.  I have therefore elected to continue.  4 carotid artery disease-mild on most recent  Dopplers.  Olga Millers, MD

## 2022-11-19 ENCOUNTER — Ambulatory Visit: Payer: Medicare Other | Admitting: Cardiology

## 2022-11-22 ENCOUNTER — Ambulatory Visit: Payer: Medicare Other | Admitting: Cardiology

## 2022-11-22 ENCOUNTER — Encounter: Payer: Self-pay | Admitting: Cardiology

## 2022-11-22 VITALS — BP 128/62 | HR 62 | Ht 63.0 in | Wt 123.2 lb

## 2022-11-22 DIAGNOSIS — I2510 Atherosclerotic heart disease of native coronary artery without angina pectoris: Secondary | ICD-10-CM | POA: Diagnosis not present

## 2022-11-22 DIAGNOSIS — R002 Palpitations: Secondary | ICD-10-CM

## 2022-11-22 DIAGNOSIS — I2584 Coronary atherosclerosis due to calcified coronary lesion: Secondary | ICD-10-CM

## 2022-11-22 DIAGNOSIS — I10 Essential (primary) hypertension: Secondary | ICD-10-CM | POA: Diagnosis not present

## 2022-11-22 NOTE — Patient Instructions (Signed)
  Follow-Up: At [facility], you and your health needs are our priority.  As part of our continuing mission to provide you with exceptional heart care, we have created designated Provider Care Teams.  These Care Teams include your primary Cardiologist (physician) and Advanced Practice Providers (APPs -  Physician Assistants and Nurse Practitioners) who all work together to provide you with the care you need, when you need it.  We recommend signing up for the patient portal called "MyChart".  Sign up information is provided on this After Visit Summary.  MyChart is used to connect with patients for Virtual Visits (Telemedicine).  Patients are able to view lab/test results, encounter notes, upcoming appointments, etc.  Non-urgent messages can be sent to your provider as well.   To learn more about what you can do with MyChart, go to [email or web address].    Your next appointment:   12 month(s)  Provider:   Brian Crenshaw MD    

## 2022-12-01 ENCOUNTER — Ambulatory Visit (INDEPENDENT_AMBULATORY_CARE_PROVIDER_SITE_OTHER): Payer: Medicare Other | Admitting: Internal Medicine

## 2022-12-01 ENCOUNTER — Encounter: Payer: Self-pay | Admitting: Internal Medicine

## 2022-12-01 VITALS — BP 136/64 | HR 80 | Temp 98.0°F | Ht 63.0 in | Wt 124.0 lb

## 2022-12-01 DIAGNOSIS — I10 Essential (primary) hypertension: Secondary | ICD-10-CM | POA: Diagnosis not present

## 2022-12-01 DIAGNOSIS — L602 Onychogryphosis: Secondary | ICD-10-CM | POA: Diagnosis not present

## 2022-12-01 DIAGNOSIS — E538 Deficiency of other specified B group vitamins: Secondary | ICD-10-CM

## 2022-12-01 MED ORDER — CYANOCOBALAMIN 1000 MCG/ML IJ SOLN
1000.0000 ug | Freq: Once | INTRAMUSCULAR | Status: AC
Start: 2022-12-01 — End: 2022-12-01
  Administered 2022-12-01: 1000 ug via INTRAMUSCULAR

## 2022-12-01 NOTE — Patient Instructions (Addendum)
   B12 injection   Medications changes include :   start taking a hair, skin and nails vitamin.  Start a multivitamin.      Return for follow up as scheduled.

## 2022-12-01 NOTE — Assessment & Plan Note (Signed)
Chronic BP well controlled Continue amodipine 2.5 mg daily, losartan 100 mg daily, metoprolol xl 50 mg daily

## 2022-12-01 NOTE — Assessment & Plan Note (Signed)
Chronic Has been on B12 injections for a long time due to having poor oral absorption of iron Continue monthly B12 injections B12 injection due today-given

## 2022-12-01 NOTE — Assessment & Plan Note (Addendum)
Subacute Has had lef thumb, right middle finger and right pinky finger thickening and the left thumb nail has partially fallen off They are sore Saw derm - no fungal infection detected but still possible - applying ketoconazole cream Will start hair, skin and nails vitamin and MVI Continue B12 injections monthly

## 2022-12-01 NOTE — Addendum Note (Signed)
Addended by: Karma Ganja on: 12/01/2022 04:28 PM   Modules accepted: Orders

## 2022-12-01 NOTE — Progress Notes (Signed)
Subjective:    Patient ID: Tami Jacobson, female    DOB: Aug 04, 1927, 89 y.o.   MRN: [id number]      HPI Tami Jacobson is here for  Chief Complaint  Patient presents with   Fingernail loss   Lip discomfort    Bottom lip becomes red and irritated    Nails keep getting thickened end and are falling off.  Saw derm  - not fungal.    Eats breakfast  - scrambled eggs, oatmeal, toast, fruit, coffee, cranberry juice  Eats soup for lunch  Supper - soup or whatever is there --- does not like the food cooked for dinner  Also states dry skin and hair is thinning.     Medications and allergies reviewed with patient and updated if appropriate.  Current Outpatient Medications on File Prior to Visit  Medication Sig Dispense Refill   acetaminophen (TYLENOL 8 HOUR) 650 MG CR tablet Take 2 tablets every 8 hours by oral route.     amLODipine (NORVASC) 2.5 MG tablet Take 1 tablet (2.5 mg total) by mouth daily. For blood pressure 100 tablet 1   clobetasol cream (TEMOVATE) 0.05 % APPLY CREAM TOPICALLY TWICE DAILY AS NEEDED FOR IRRITATION FROM LICHEN SCLEROSUS     cyanocobalamin (,VITAMIN B-12,) 1000 MCG/ML injection Inject 1,000 mcg into the muscle every 30 (thirty) days.     flecainide (TAMBOCOR) 50 MG tablet TAKE 1 TABLET BY MOUTH TWICE  DAILY 200 tablet 2   losartan (COZAAR) 100 MG tablet Take 1 tablet (100 mg total) by mouth daily. For blood pressure 100 tablet 1   metFORMIN (GLUCOPHAGE-XR) 500 MG 24 hr tablet Take 1 tablet (500 mg total) by mouth daily with breakfast. For your sugar 100 tablet 2   metoprolol succinate (TOPROL-XL) 50 MG 24 hr tablet TAKE 1 TABLET BY MOUTH DAILY 100 tablet 2   omeprazole (PRILOSEC) 40 MG capsule TAKE 1 CAPSULE BY MOUTH DAILY 100 capsule 2   No current facility-administered medications on file prior to visit.    Review of Systems     Objective:   Vitals:   12/01/22 1408  BP: (!) 142/54  Pulse: 80  Temp: 98 F (36.7 C)  SpO2: 97%   BP Readings  from Last 3 Encounters:  12/01/22 (!) 142/54  11/22/22 128/62  09/27/22 118/62   Wt Readings from Last 3 Encounters:  12/01/22 124 lb (56.2 kg)  11/22/22 123 lb 3.2 oz (55.9 kg)  09/27/22 121 lb (54.9 kg)   Body mass index is 21.97 kg/m.    Physical Exam Constitutional:      General: She is not in acute distress.    Appearance: Normal appearance. She is not ill-appearing.  HENT:     Head: Normocephalic and atraumatic.  Skin:    General: Skin is warm and dry.     Findings: No erythema or rash.     Comments: Left thumb, right middle and pinky finger nails slightly thickened.  Tenderness about left thumb - nail slowly growing out but thickened at end.    Neurological:     Mental Status: She is alert. Mental status is at baseline.  Psychiatric:        Mood and Affect: Mood normal.        Behavior: Behavior normal.        Thought Content: Thought content normal.        Judgment: Judgment normal.            Assessment & Plan:  See Problem List for Assessment and Plan of chronic medical problems.

## 2022-12-20 ENCOUNTER — Ambulatory Visit: Payer: Medicare Other

## 2022-12-27 ENCOUNTER — Telehealth: Payer: Self-pay

## 2022-12-27 ENCOUNTER — Ambulatory Visit (INDEPENDENT_AMBULATORY_CARE_PROVIDER_SITE_OTHER): Payer: Medicare Other

## 2022-12-27 DIAGNOSIS — Z23 Encounter for immunization: Secondary | ICD-10-CM | POA: Diagnosis not present

## 2022-12-27 NOTE — Telephone Encounter (Signed)
Patient seen in office today and wanted to let Dr. Lawerance Bach know her nails were looking better after taking recommended vitamins.   She also wanted to let Dr. Lawerance Bach know she had COVID as well.

## 2022-12-27 NOTE — Progress Notes (Signed)
Patient presented in the office for her HD flu vaccine. HD Flu vaccine was administered into her Left deltoid muscle.  She tolerated the injection well and the injection site looked fine. Patient was advised to report to the office immediately if she noticed any adverse reaction.

## 2023-01-01 ENCOUNTER — Other Ambulatory Visit: Payer: Self-pay | Admitting: Internal Medicine

## 2023-01-02 ENCOUNTER — Emergency Department (HOSPITAL_COMMUNITY)
Admission: EM | Admit: 2023-01-02 | Discharge: 2023-01-02 | Disposition: A | Discharge status: Home / Self Care | Payer: Medicare Other | Attending: Emergency Medicine | Admitting: Emergency Medicine

## 2023-01-02 ENCOUNTER — Emergency Department (HOSPITAL_COMMUNITY): Payer: Medicare Other

## 2023-01-02 ENCOUNTER — Other Ambulatory Visit: Payer: Self-pay

## 2023-01-02 ENCOUNTER — Encounter (HOSPITAL_COMMUNITY): Payer: Self-pay

## 2023-01-02 DIAGNOSIS — M4854XA Collapsed vertebra, not elsewhere classified, thoracic region, initial encounter for fracture: Secondary | ICD-10-CM | POA: Diagnosis not present

## 2023-01-02 DIAGNOSIS — Z91040 Latex allergy status: Secondary | ICD-10-CM | POA: Diagnosis not present

## 2023-01-02 DIAGNOSIS — S065X0A Traumatic subdural hemorrhage without loss of consciousness, initial encounter: Secondary | ICD-10-CM | POA: Diagnosis not present

## 2023-01-02 DIAGNOSIS — M8580 Other specified disorders of bone density and structure, unspecified site: Secondary | ICD-10-CM | POA: Diagnosis not present

## 2023-01-02 DIAGNOSIS — E119 Type 2 diabetes mellitus without complications: Secondary | ICD-10-CM | POA: Diagnosis not present

## 2023-01-02 DIAGNOSIS — Y92129 Unspecified place in nursing home as the place of occurrence of the external cause: Secondary | ICD-10-CM | POA: Diagnosis not present

## 2023-01-02 DIAGNOSIS — Z853 Personal history of malignant neoplasm of breast: Secondary | ICD-10-CM | POA: Insufficient documentation

## 2023-01-02 DIAGNOSIS — S065XAA Traumatic subdural hemorrhage with loss of consciousness status unknown, initial encounter: Secondary | ICD-10-CM

## 2023-01-02 DIAGNOSIS — R5383 Other fatigue: Secondary | ICD-10-CM | POA: Diagnosis not present

## 2023-01-02 DIAGNOSIS — Z7984 Long term (current) use of oral hypoglycemic drugs: Secondary | ICD-10-CM | POA: Diagnosis not present

## 2023-01-02 DIAGNOSIS — Z79899 Other long term (current) drug therapy: Secondary | ICD-10-CM | POA: Diagnosis not present

## 2023-01-02 DIAGNOSIS — R58 Hemorrhage, not elsewhere classified: Secondary | ICD-10-CM | POA: Diagnosis not present

## 2023-01-02 DIAGNOSIS — W1830XA Fall on same level, unspecified, initial encounter: Secondary | ICD-10-CM | POA: Diagnosis not present

## 2023-01-02 DIAGNOSIS — S0181XA Laceration without foreign body of other part of head, initial encounter: Secondary | ICD-10-CM | POA: Diagnosis not present

## 2023-01-02 DIAGNOSIS — S22010A Wedge compression fracture of first thoracic vertebra, initial encounter for closed fracture: Secondary | ICD-10-CM | POA: Diagnosis not present

## 2023-01-02 DIAGNOSIS — Z043 Encounter for examination and observation following other accident: Secondary | ICD-10-CM | POA: Diagnosis not present

## 2023-01-02 DIAGNOSIS — W19XXXA Unspecified fall, initial encounter: Secondary | ICD-10-CM | POA: Diagnosis not present

## 2023-01-02 DIAGNOSIS — R519 Headache, unspecified: Secondary | ICD-10-CM | POA: Diagnosis present

## 2023-01-02 DIAGNOSIS — R6889 Other general symptoms and signs: Secondary | ICD-10-CM | POA: Diagnosis not present

## 2023-01-02 DIAGNOSIS — R000 Tachycardia, unspecified: Secondary | ICD-10-CM | POA: Diagnosis not present

## 2023-01-02 DIAGNOSIS — Z7401 Bed confinement status: Secondary | ICD-10-CM | POA: Diagnosis not present

## 2023-01-02 DIAGNOSIS — M47813 Spondylosis without myelopathy or radiculopathy, cervicothoracic region: Secondary | ICD-10-CM | POA: Diagnosis not present

## 2023-01-02 DIAGNOSIS — Z743 Need for continuous supervision: Secondary | ICD-10-CM | POA: Diagnosis not present

## 2023-01-02 DIAGNOSIS — R400 Somnolence: Secondary | ICD-10-CM | POA: Diagnosis not present

## 2023-01-02 LAB — CBC WITH DIFFERENTIAL/PLATELET
Abs Immature Granulocytes: 0.02 10*3/uL (ref 0.00–0.07)
Basophils Absolute: 0 10*3/uL (ref 0.0–0.1)
Basophils Relative: 0 %
Eosinophils Absolute: 0 10*3/uL (ref 0.0–0.5)
Eosinophils Relative: 0 %
HCT: 36.6 % (ref 36.0–46.0)
Hemoglobin: 11.6 g/dL — ABNORMAL LOW (ref 12.0–15.0)
Immature Granulocytes: 0 %
Lymphocytes Relative: 5 %
Lymphs Abs: 0.4 10*3/uL — ABNORMAL LOW (ref 0.7–4.0)
MCH: 31.5 pg (ref 26.0–34.0)
MCHC: 31.7 g/dL (ref 30.0–36.0)
MCV: 99.5 fL (ref 80.0–100.0)
Monocytes Absolute: 0.5 10*3/uL (ref 0.1–1.0)
Monocytes Relative: 6 %
Neutro Abs: 6.9 10*3/uL (ref 1.7–7.7)
Neutrophils Relative %: 89 %
Platelets: 145 10*3/uL — ABNORMAL LOW (ref 150–400)
RBC: 3.68 MIL/uL — ABNORMAL LOW (ref 3.87–5.11)
RDW: 14.2 % (ref 11.5–15.5)
WBC: 7.8 10*3/uL (ref 4.0–10.5)
nRBC: 0 % (ref 0.0–0.2)

## 2023-01-02 LAB — BASIC METABOLIC PANEL
Anion gap: 11 (ref 5–15)
BUN: 19 mg/dL (ref 8–23)
CO2: 21 mmol/L — ABNORMAL LOW (ref 22–32)
Calcium: 9.4 mg/dL (ref 8.9–10.3)
Chloride: 102 mmol/L (ref 98–111)
Creatinine, Ser: 0.79 mg/dL (ref 0.44–1.00)
GFR, Estimated: >60 mL/min (ref >60)
Glucose, Bld: 161 mg/dL — ABNORMAL HIGH (ref 70–99)
Potassium: 4.1 mmol/L (ref 3.5–5.1)
Sodium: 134 mmol/L — ABNORMAL LOW (ref 135–145)

## 2023-01-02 MED ORDER — LIDOCAINE-EPINEPHRINE (PF) 2 %-1:200000 IJ SOLN
20.0000 mL | Freq: Once | INTRAMUSCULAR | Status: AC
  Administered 2023-01-02: 20 mL via INTRADERMAL
  Filled 2023-01-02: qty 20

## 2023-01-02 MED ORDER — ACETAMINOPHEN 500 MG PO TABS
1000.0000 mg | ORAL_TABLET | Freq: Once | ORAL | Status: AC
  Administered 2023-01-02: 1000 mg via ORAL
  Filled 2023-01-02: qty 2

## 2023-01-02 NOTE — ED Triage Notes (Signed)
BIB EMS/ mechanical fall from independent living facility/ pt reports sitting on bedside commode and fell forward landing head first on hardwood floor/ lac to forehead/ denies LOC/ denies blood thinners/ A&Ox4

## 2023-01-02 NOTE — ED Notes (Signed)
[facility] called for transport    no eta at this time

## 2023-01-02 NOTE — ED Provider Notes (Signed)
[place] EMERGENCY DEPARTMENT AT [facility] Provider Note  CSN: [id number] Arrival date & time: 01/02/23 8295  Chief Complaint(s) Fall BIB EMS/ mechanical fall from independent living facility/ pt reports sitting on bedside commode and fell forward landing head first on hardwood floor/ lac to forehead/ denies LOC/ denies blood thinners/ A&Ox4  HPI Tami Jacobson is a 89 y.o. female here after mechanical fall. Sustained forehead laceration. Complians of frontal headache. No neck pain, BP or other injuries. No blood thinners.   HPI  Past Medical History Past Medical History:  Diagnosis Date   Arthritis    left hip   Breast cancer (HCC)    Breast cancer, right (HCC) 11/2016   Family history of breast cancer    Family history of colon cancer    Family history of pancreatic cancer    GERD (gastroesophageal reflux disease)    Non-insulin dependent type 2 diabetes mellitus (HCC)    PAC (premature atrial contraction)    Personal history of radiation therapy 12/2016   Premature ventricular contraction    Seasonal allergies    Sensitive skin    Vitamin B12 deficiency    Patient Active Problem List   Diagnosis Date Noted   Thickened nails 12/01/2022   Senile purpura (HCC) 09/08/2022   Skin abnormality 09/08/2022   Nail disorder 08/24/2022   Weakness generalized 12/23/2021   Decreased hearing 12/18/2021   Excessive cerumen in both ear canals 11/20/2021   Decreased activities of daily living (ADL) 11/11/2021   Right leg pain 09/12/2021   Lumbar radiculopathy, right 08/10/2021   Anemia 08/09/2021   Atrial fibrillation (HCC) 07/23/2021   Fall 07/22/2021   Low back pain 07/06/2021   Dry mouth 12/09/2020   Tremor 12/09/2020   Lichen sclerosus et atrophicus of the vulva 04/04/2020   Cataract 04/04/2020   Mild memory disturbance 01/30/2020   Palpitations 05/23/2019   Chronic diastolic CHF (congestive heart failure) (HCC) 05/14/2019   Mild tricuspid  regurgitation 05/13/2019   LVH (left ventricular hypertrophy), mild 05/13/2019   Genetic testing 08/30/2017   Family history of breast cancer    Family history of colon cancer    Family history of pancreatic cancer    Abnormal electrocardiogram 05/05/2015   BPPV (benign paroxysmal positional vertigo) 10/01/2014   Murmur 04/18/2014   Anal fissure 12/16/2011   Fibrocystic breast changes. Left.  Biopsy 07/02/2011. 07/14/2011   History of breast cancer, Right, T1c, N0, lumpectomy 06/26/2008.   Left, T1c, N0, lumpectomy 06/24/2006. 05/12/2011   Constipation 07/03/2009   Diabetes mellitus type 2, controlled (HCC) 06/11/2009   PREMATURE VENTRICULAR CONTRACTIONS 08/19/2008   B12 deficiency 08/29/2007   ESOPHAGEAL STRICTURE 04/27/2007   GERD (gastroesophageal reflux disease) 04/27/2007   DIVERTICULOSIS, COLON 04/27/2007   OTHER ALOPECIA 02/14/2007   Dyslipidemia 01/02/2007   Essential hypertension 01/02/2007   Carotid stenosis 08/22/2006   Home Medication(s) Prior to Admission medications   Medication Sig Start Date End Date Taking? Authorizing Provider  acetaminophen (TYLENOL 8 HOUR) 650 MG CR tablet Take 2 tablets every 8 hours by oral route.    [provider]  amLODipine (NORVASC) 2.5 MG tablet Take 1 tablet (2.5 mg total) by mouth daily. For blood pressure 05/28/22   Burns, Bobette Mo, MD  clobetasol cream (TEMOVATE) 0.05 % APPLY CREAM TOPICALLY TWICE DAILY AS NEEDED FOR IRRITATION FROM LICHEN SCLEROSUS    [provider]  cyanocobalamin (,VITAMIN B-12,) 1000 MCG/ML injection Inject 1,000 mcg into the muscle every 30 (thirty) days.  [provider]  flecainide (TAMBOCOR) 50 MG tablet TAKE 1 TABLET BY MOUTH TWICE  DAILY 07/21/22   Lewayne Bunting, MD  losartan (COZAAR) 100 MG tablet Take 1 tablet (100 mg total) by mouth daily. For blood pressure 05/28/22   Pincus Sanes, MD  metFORMIN (GLUCOPHAGE-XR) 500 MG 24 hr tablet Take 1 tablet (500 mg total) by mouth daily  with breakfast. For your sugar 05/28/22   Pincus Sanes, MD  metoprolol succinate (TOPROL-XL) 50 MG 24 hr tablet TAKE 1 TABLET BY MOUTH DAILY 07/21/22   Lewayne Bunting, MD  omeprazole (PRILOSEC) 40 MG capsule TAKE 1 CAPSULE BY MOUTH DAILY 04/16/22   Pincus Sanes, MD                                                                                                                                    Allergies Statins, Effexor [venlafaxine], Gabapentin, Cephalexin, Codeine, Lansoprazole, Latex, Levofloxacin, Nitrofurantoin, and Rofecoxib  Review of Systems Review of Systems As noted in HPI  Physical Exam Vital Signs  I have reviewed the triage vital signs BP (!) 143/58 (BP Location: Left Arm)   Pulse 70   Temp 98 F (36.7 C) (Oral)   Resp 11   Wt 52.6 kg   SpO2 98%   BMI 20.55 kg/m   Physical Exam Constitutional:      General: She is not in acute distress.    Appearance: She is well-developed. She is not diaphoretic.  HENT:     Head: Normocephalic. Laceration present.      Right Ear: External ear normal.     Left Ear: External ear normal.     Nose: Nose normal.  Eyes:     General: No scleral icterus.       Right eye: No discharge.        Left eye: No discharge.     Conjunctiva/sclera: Conjunctivae normal.     Pupils: Pupils are equal, round, and reactive to light.  Cardiovascular:     Rate and Rhythm: Normal rate and regular rhythm.     Pulses:          Radial pulses are 2+ on the right side and 2+ on the left side.       Dorsalis pedis pulses are 2+ on the right side and 2+ on the left side.     Heart sounds: Normal heart sounds. No murmur heard.    No friction rub. No gallop.  Pulmonary:     Effort: Pulmonary effort is normal. No respiratory distress.     Breath sounds: Normal breath sounds. No stridor. No wheezing.  Abdominal:     General: There is no distension.     Palpations: Abdomen is soft.     Tenderness: There is no abdominal tenderness.  Musculoskeletal:         General: No tenderness.     Cervical back: Normal range  of motion and neck supple. No bony tenderness.     Thoracic back: No bony tenderness.     Lumbar back: No bony tenderness.     Comments: Clavicles stable. Chest stable to AP/Lat compression. Pelvis stable to Lat compression. No obvious extremity deformity. No chest or abdominal wall contusion.  Skin:    General: Skin is warm and dry.     Findings: No erythema or rash.  Neurological:     Mental Status: She is alert and oriented to person, place, and time.     Comments: Moving all extremities     ED Results and Treatments Labs (all labs ordered are listed, but only abnormal results are displayed) Labs Reviewed  CBC WITH DIFFERENTIAL/PLATELET - Abnormal; Notable for the following components:      Result Value   RBC 3.68 (*)    Hemoglobin 11.6 (*)    Platelets 145 (*)    Lymphs Abs 0.4 (*)    All other components within normal limits  BASIC METABOLIC PANEL - Abnormal; Notable for the following components:   Sodium 134 (*)    CO2 21 (*)    Glucose, Bld 161 (*)    All other components within normal limits                                                                                                                         EKG  EKG Interpretation Date/Time:  Sunday January 02 2023 05:42:07 EDT Ventricular Rate:  70 PR Interval:  186 QRS Duration:  104 QT Interval:  397 QTC Calculation: 429 R Axis:   -78  Text Interpretation: Sinus rhythm Abnormal R-wave progression, late transition Inferior infarct, old No acute changes Confirmed by Drema Pry [phone]) on 01/02/2023 6:33:44 AM       Radiology CT Head Wo Contrast  Addendum Date: 01/02/2023   ADDENDUM REPORT: 01/02/2023 07:25 ADDENDUM: Study discussed by telephone with Dr. Drema Pry on 01/02/2023 at 0640 hours. Electronically Signed   By: Odessa Fleming M.D.   On: 01/02/2023 07:25   Result Date: 01/02/2023 CLINICAL DATA:  89 year old female status post fall  forward from commode. EXAM: CT HEAD WITHOUT CONTRAST TECHNIQUE: Contiguous axial images were obtained from the base of the skull through the vertex without intravenous contrast. RADIATION DOSE REDUCTION: This exam was performed according to the departmental dose-optimization program which includes automated exposure control, adjustment of the mA and/or kV according to patient size and/or use of iterative reconstruction technique. COMPARISON:  Brain MRI 12/30/2009.  Head CT 07/22/2021. FINDINGS: Brain: Mixed density right side subdural hematoma with scattered globular hyperdense internal blood products measures up to 8 mm in thickness along the superior right convexity and is new since last year. No left subdural hematoma. No IVH or acute ventriculomegaly. No midline shift. No other acute intracranial hemorrhage. Normal basilar cisterns. Stable gray-white matter differentiation throughout the brain. Mild for age cerebral white matter hypodensity. No cortically based acute infarct identified. Vascular: No suspicious intracranial  vascular hyperdensity. Calcified atherosclerosis at the skull base. Skull: No acute fracture identified. Sinuses/Orbits: Visualized paranasal sinuses and mastoids are clear. Other: Left anterior forehead, scalp soft tissue deficiency on series 4, image 20 suspicious for acute laceration. Minimal associated hematoma. Underlying left frontal bone appears intact. Visualized orbit soft tissues are within normal limits. Traumatic Brain Injury Risk Stratification Skull Fracture: No - Low/mBIG 1 Subdural Hematoma (SDH): 8mm plus - High/mBIG 3 Subarachnoid Hemorrhage [facility]): No Epidural Hematoma (EDH): No - Low/mBIG 1 Cerebral contusion, intra-axial, intraparenchymal Hemorrhage (IPH): No Intraventricular Hemorrhage (IVH): No - Low/mBIG 1 Midline Shift > 1mm or Edema/effacement of sulci/vents: No - Low/mBIG 1 ---------------------------------------------------- IMPRESSION: 1. Positive for mixed density  right side Subdural Hematoma, up to 8 mm in thickness and new from last year. No midline shift. No other acute intracranial abnormality. 2. Left forehead scalp soft tissue injury.  No skull fracture. Electronically Signed: By: Odessa Fleming M.D. On: 01/02/2023 06:32   CT Cervical Spine Wo Contrast  Result Date: 01/02/2023 CLINICAL DATA:  89 year old female status post fall forward from commode. EXAM: CT CERVICAL SPINE WITHOUT CONTRAST TECHNIQUE: Multidetector CT imaging of the cervical spine was performed without intravenous contrast. Multiplanar CT image reconstructions were also generated. RADIATION DOSE REDUCTION: This exam was performed according to the departmental dose-optimization program which includes automated exposure control, adjustment of the mA and/or kV according to patient size and/or use of iterative reconstruction technique. COMPARISON:  Head CT today.  Cervical spine CT 07/22/2021. FINDINGS: Alignment: Stable straightening of cervical lordosis. Cervicothoracic junction alignment is within normal limits. Bilateral posterior element alignment is within normal limits. Skull base and vertebrae: Osteopenia. Visualized skull base is intact. No atlanto-occipital dissociation. C1 and C2 appear intact and aligned. No acute osseous abnormality identified. Soft tissues and spinal canal: No prevertebral fluid or swelling. No visible canal hematoma. Disc levels: Stable mild for age cervical spine degeneration. No CT evidence of cervical spinal stenosis. Calcified cervical carotid atherosclerosis, otherwise negative visible noncontrast neck soft tissues. Upper chest: Mild T1 superior endplate compression fracture is new since last year (series 6, image 36). Chronic cervicothoracic junction level facet arthropathy. T2 vertebra appears intact. Stable apical lung scarring. IMPRESSION: 1. Mild T1 superior endplate compression fracture is new since last year. No retropulsion or complicating features. Underlying  Osteopenia. 2. No acute traumatic injury identified in the cervical spine. Mild for age cervical spine degeneration. Electronically Signed   By: Odessa Fleming M.D.   On: 01/02/2023 06:35    Medications Ordered in ED Medications  lidocaine-EPINEPHrine (XYLOCAINE W/EPI) 2 %-1:200000 (PF) injection 20 mL (20 mLs Intradermal Given 01/02/23 0622)  acetaminophen (TYLENOL) tablet 1,000 mg (1,000 mg Oral Given 01/02/23 8413)   Procedures .Marland KitchenLaceration Repair  Date/Time: 01/02/2023 7:28 AM  Performed by: Nira Conn, MD Authorized by: Nira Conn, MD   Consent:    Consent obtained:  Verbal   Consent given by:  Patient   Risks discussed:  Infection, pain, poor cosmetic result and poor wound healing   Alternatives discussed:  No treatment Universal protocol:    Patient identity confirmed:  Verbally with patient and arm band Anesthesia:    Anesthesia method:  Local infiltration   Local anesthetic:  Lidocaine 2% WITH epi Laceration details:    Location:  Face   Face location:  Forehead   Length (cm):  3   Depth (mm):  3 Pre-procedure details:    Preparation:  Patient was prepped and draped in usual sterile fashion and imaging obtained  to evaluate for foreign bodies Exploration:    Hemostasis achieved with:  Direct pressure   Imaging obtained: x-ray     Imaging outcome: foreign body not noted     Wound extent: fascia not violated, no foreign body and no underlying fracture     Contaminated: no   Treatment:    Area cleansed with:  Povidone-iodine and saline   Amount of cleaning:  Standard   Irrigation solution:  Sterile saline   Irrigation volume:  500cc   Irrigation method:  Pressure wash   Layers/structures repaired:  Deep subcutaneous Deep subcutaneous:    Suture size:  4-0   Suture material:  Vicryl   Suture technique:  Buried horizontal mattress   Number of sutures:  1 Skin repair:    Repair method:  Sutures   Suture size:  5-0   Suture material:  Fast-absorbing  gut   Suture technique:  Running locked and horizontal mattress   Number of sutures:  8 Approximation:    Approximation:  Close Repair type:    Repair type:  Intermediate Post-procedure details:    Procedure completion:  Tolerated   (including critical care time) Medical Decision Making / ED Course   Medical Decision Making Amount and/or Complexity of Data Reviewed Labs: ordered. Radiology: ordered.  Risk OTC drugs. Prescription drug management.    Mechanical fall resulting in head trauma at her independent living facility.  Sustained lacerations of the forehead that was thoroughly irrigated and closed as above  CT head and cervical spine notable for right subdural hematoma.  Also noted to have T1 compression fracture.  CBC with stable Hb  Discussed with Dr. Conchita Paris who reviewed the patient's films and discussed limiting her physical exam.  Based on findings, he does not feel patient requires admission to the hospital at this time.  He does request that she follow-up in clinic for repeat CT scan.    Final Clinical Impression(s) / ED Diagnoses Final diagnoses:  Fall at nursing home, initial encounter  Facial laceration, initial encounter  Subdural hematoma (HCC)  Compression fracture of T1 vertebra, initial encounter [facility])   The patient appears reasonably screened and/or stabilized for discharge and I doubt any other medical condition or other [facility] requiring further screening, evaluation, or treatment in the ED at this time. I have discussed the findings, Dx and Tx plan with the patient/family who expressed understanding and agree(s) with the plan. Discharge instructions discussed at length. The patient/family was given strict return precautions who verbalized understanding of the instructions. No further questions at time of discharge.  Disposition: Discharge  Condition: Good  ED Discharge Orders     None        Follow Up: Pincus Sanes, MD [address] [place] [address] [phone]  Call  as needed  Lisbeth Renshaw, MD [address] [place] [address] [phone]  Call  to schedule an appointment for close follow up for subdural hematoma    This chart was dictated using voice recognition software.  Despite best efforts to proofread,  errors can occur which can change the documentation meaning.    Nira Conn, MD 01/02/23 [phone]

## 2023-01-02 NOTE — Discharge Instructions (Addendum)
Thank you for allowing [place] to take care of you today.  We hope you begin feeling better soon. During your work up we noted blood around your brain - it appeared that some of it was old and small portion was from today. We also noted a fracture of your T1 vertebrae likely from a fall in the past year since you do not feel any pain in the area.  To-Do: Please follow-up with your primary doctor and neurosurgery. Please return to the Emergency Department or call 911 if you experience severe headaches, persistent confusion, chest pain, shortness of breath, severe pain, severe fever, or have any reason to think that you need emergency medical care.  Thank you again.  Hope you feel better soon.  For pain control you may take 1000 mg of acetaminophen (Tylenol) every 8 hours as needed. Refrain from using NSAIDS, including aspirin, Ibuprofen (Motrin, Advil, etc.), or Aleve until cleared by neurosurgery.  Please limit acetaminophen (Tylenol) to 4000 mg for a 24hr period. Please note that other over-the-counter medicine may contain acetaminophen as a component of their ingredients.   Do not let your laceration (cut) get wet for the next 48 hours. After that you may allow soapy water to drain down the wound to clean it.  Please do not scrub.  Do not submerge the wound under water for the next 2 weeks.   To minimize scarring, you can apply a vaseline based ointment for the next 2 weeks and keep it out of direct sun light. After that, you may apply sunscreen for the next several months.   Your stitches will dissolve on their own.   Return if your wound appears to be infected (see laceration care instructions).

## 2023-01-02 NOTE — ED Notes (Signed)
Spoke with [facility] and was told they cannot give me an exact time, however, she is next on the list.

## 2023-01-05 DIAGNOSIS — S065XAA Traumatic subdural hemorrhage with loss of consciousness status unknown, initial encounter: Secondary | ICD-10-CM | POA: Diagnosis not present

## 2023-01-10 ENCOUNTER — Other Ambulatory Visit: Payer: Self-pay | Admitting: Neurosurgery

## 2023-01-10 DIAGNOSIS — S065XAA Traumatic subdural hemorrhage with loss of consciousness status unknown, initial encounter: Secondary | ICD-10-CM

## 2023-01-11 ENCOUNTER — Ambulatory Visit: Payer: Medicare Other | Admitting: Podiatry

## 2023-01-11 ENCOUNTER — Encounter: Payer: Self-pay | Admitting: Internal Medicine

## 2023-01-11 ENCOUNTER — Encounter: Payer: Self-pay | Admitting: Podiatry

## 2023-01-11 DIAGNOSIS — M79675 Pain in left toe(s): Secondary | ICD-10-CM | POA: Diagnosis not present

## 2023-01-11 DIAGNOSIS — M79674 Pain in right toe(s): Secondary | ICD-10-CM | POA: Diagnosis not present

## 2023-01-11 DIAGNOSIS — B351 Tinea unguium: Secondary | ICD-10-CM | POA: Diagnosis not present

## 2023-01-11 DIAGNOSIS — E118 Type 2 diabetes mellitus with unspecified complications: Secondary | ICD-10-CM

## 2023-01-11 NOTE — Progress Notes (Unsigned)
Subjective:    Patient ID: Tami Jacobson, female    DOB: [birth year], 89 y.o.   MRN: [id number]     HPI Tami Jacobson is here for follow up from the ED  ED 9/29 had mechanical fall at living facility.  She stated she was sitting on bedside commode and fell forward landing head first on nightstand. Sustained laceration to forehead.  No LOC.   Not on blood thinners.  A&O x 4.    CT head - Subdural hematoma up to 8 mm in thickness new from last year.  No shift.  Left forehead scalp soft tissue injury, no skull fx.    Laceration repair - forehead - 8 sutures.  There were self dissolving.    Ct neck - mild T1 superior endplate compression fx - new since last year.  No complicating features.  No acute traumatic injury in c-spine.    Advised f/u with neurosurgery.   She did see them and has imaging scheduled for f/u- Ct of head the week of 10/28.     Medications and allergies reviewed with patient and updated if appropriate.  Current Outpatient Medications on File Prior to Visit  Medication Sig Dispense Refill   acetaminophen (TYLENOL 8 HOUR) 650 MG CR tablet Take 2 tablets every 8 hours by oral route.     amLODipine (NORVASC) 2.5 MG tablet TAKE 1 TABLET BY MOUTH DAILY FOR BLOOD PRESSURE 100 tablet 2   clobetasol cream (TEMOVATE) 0.05 % APPLY CREAM TOPICALLY TWICE DAILY AS NEEDED FOR IRRITATION FROM LICHEN SCLEROSUS     cyanocobalamin (,VITAMIN B-12,) 1000 MCG/ML injection Inject 1,000 mcg into the muscle every 30 (thirty) days.     flecainide (TAMBOCOR) 50 MG tablet TAKE 1 TABLET BY MOUTH TWICE  DAILY 200 tablet 2   losartan (COZAAR) 100 MG tablet TAKE 1 TABLET BY MOUTH DAILY FOR BLOOD PRESSURE 100 tablet 2   metFORMIN (GLUCOPHAGE-XR) 500 MG 24 hr tablet Take 1 tablet (500 mg total) by mouth daily with breakfast. For your sugar 100 tablet 2   metoprolol succinate (TOPROL-XL) 50 MG 24 hr tablet TAKE 1 TABLET BY MOUTH DAILY 100 tablet 2   omeprazole (PRILOSEC) 40 MG capsule TAKE 1  CAPSULE BY MOUTH DAILY 100 capsule 2   No current facility-administered medications on file prior to visit.     Review of Systems  Constitutional:  Negative for fever.  Eyes:  Negative for visual disturbance.  Respiratory:  Negative for shortness of breath.   Cardiovascular:  Negative for chest pain and palpitations.  Neurological:  Positive for dizziness (a little -not new), light-headedness (a little -not new) and headaches (mild). Negative for numbness.       Objective:   Vitals:   01/12/23 1345  BP: (!) 140/58  Pulse: 78  Temp: 97.8 F (36.6 C)  SpO2: 98%   BP Readings from Last 3 Encounters:  01/12/23 (!) 140/58  01/02/23 (!) 117/96  12/01/22 136/64   Wt Readings from Last 3 Encounters:  01/12/23 120 lb (54.4 kg)  01/02/23 116 lb (52.6 kg)  12/01/22 124 lb (56.2 kg)   Body mass index is 21.26 kg/m.    Physical Exam Constitutional:      General: She is not in acute distress.    Appearance: Normal appearance.  HENT:     Head: Normocephalic.  Eyes:     Conjunctiva/sclera: Conjunctivae normal.  Cardiovascular:     Rate and Rhythm: Normal rate and regular rhythm.  Heart sounds: Murmur (2/6 systolic) heard.  Pulmonary:     Effort: Pulmonary effort is normal. No respiratory distress.     Breath sounds: Normal breath sounds. No wheezing.  Musculoskeletal:     Cervical back: Neck supple.     Right lower leg: No edema.     Left lower leg: No edema.  Lymphadenopathy:     Cervical: No cervical adenopathy.  Skin:    General: Skin is warm and dry.     Findings: No rash.  Neurological:     Mental Status: She is alert. Mental status is at baseline.  Psychiatric:        Mood and Affect: Mood normal.        Behavior: Behavior normal.        Lab Results  Component Value Date   WBC 7.8 01/02/2023   HGB 11.6 (L) 01/02/2023   HCT 36.6 01/02/2023   PLT 145 (L) 01/02/2023   GLUCOSE 161 (H) 01/02/2023   CHOL 173 02/22/2019   TRIG 157.0 (H) 02/22/2019    HDL 50.40 02/22/2019   LDLDIRECT 128.3 12/26/2012   LDLCALC 92 02/22/2019   ALT 11 09/27/2022   AST 16 09/27/2022   NA 134 (L) 01/02/2023   K 4.1 01/02/2023   CL 102 01/02/2023   CREATININE 0.79 01/02/2023   BUN 19 01/02/2023   CO2 21 (L) 01/02/2023   TSH 2.519 05/15/2019   HGBA1C 6.9 (H) 09/27/2022   MICROALBUR 1.0 08/23/2018   CT Head Wo Contrast Addendum: ADDENDUM REPORT: 01/02/2023 07:25   ADDENDUM:  Study discussed by telephone with Dr. Drema Pry on 01/02/2023 at  0640 hours.   Electronically Signed    By: Odessa Fleming M.D.    On: 01/02/2023 07:25 Narrative: CLINICAL DATA:  89 year old female status post fall forward from commode.  EXAM: CT HEAD WITHOUT CONTRAST  TECHNIQUE: Contiguous axial images were obtained from the base of the skull through the vertex without intravenous contrast.  RADIATION DOSE REDUCTION: This exam was performed according to the departmental dose-optimization program which includes automated exposure control, adjustment of the mA and/or kV according to patient size and/or use of iterative reconstruction technique.  COMPARISON:  Brain MRI 12/30/2009.  Head CT 07/22/2021.  FINDINGS: Brain: Mixed density right side subdural hematoma with scattered globular hyperdense internal blood products measures up to 8 mm in thickness along the superior right convexity and is new since last year.  No left subdural hematoma. No IVH or acute ventriculomegaly. No midline shift.  No other acute intracranial hemorrhage. Normal basilar cisterns. Stable gray-white matter differentiation throughout the brain. Mild for age cerebral white matter hypodensity. No cortically based acute infarct identified.  Vascular: No suspicious intracranial vascular hyperdensity. Calcified atherosclerosis at the skull base.  Skull: No acute fracture identified.  Sinuses/Orbits: Visualized paranasal sinuses and mastoids are clear.  Other: Left anterior forehead, scalp  soft tissue deficiency on series 4, image 20 suspicious for acute laceration. Minimal associated hematoma. Underlying left frontal bone appears intact. Visualized orbit soft tissues are within normal limits.  Traumatic Brain Injury Risk Stratification  Skull Fracture: No - Low/mBIG 1  Subdural Hematoma (SDH): 8mm plus - High/mBIG 3  Subarachnoid Hemorrhage [facility]): No  Epidural Hematoma (EDH): No - Low/mBIG 1  Cerebral contusion, intra-axial, intraparenchymal Hemorrhage (IPH): No  Intraventricular Hemorrhage (IVH): No - Low/mBIG 1  Midline Shift > 1mm or Edema/effacement of sulci/vents: No - Low/mBIG 1  ----------------------------------------------------  IMPRESSION: 1. Positive for mixed density right side Subdural Hematoma, up  to 8 mm in thickness and new from last year. No midline shift. No other acute intracranial abnormality. 2. Left forehead scalp soft tissue injury.  No skull fracture.  Electronically Signed: By: Odessa Fleming M.D. On: 01/02/2023 06:32 CT Cervical Spine Wo Contrast CLINICAL DATA:  89 year old female status post fall forward from commode.  EXAM: CT CERVICAL SPINE WITHOUT CONTRAST  TECHNIQUE: Multidetector CT imaging of the cervical spine was performed without intravenous contrast. Multiplanar CT image reconstructions were also generated.  RADIATION DOSE REDUCTION: This exam was performed according to the departmental dose-optimization program which includes automated exposure control, adjustment of the mA and/or kV according to patient size and/or use of iterative reconstruction technique.  COMPARISON:  Head CT today.  Cervical spine CT 07/22/2021.  FINDINGS: Alignment: Stable straightening of cervical lordosis. Cervicothoracic junction alignment is within normal limits. Bilateral posterior element alignment is within normal limits.  Skull base and vertebrae: Osteopenia. Visualized skull base is intact. No atlanto-occipital dissociation. C1  and C2 appear intact and aligned. No acute osseous abnormality identified.  Soft tissues and spinal canal: No prevertebral fluid or swelling. No visible canal hematoma.  Disc levels: Stable mild for age cervical spine degeneration. No CT evidence of cervical spinal stenosis. Calcified cervical carotid atherosclerosis, otherwise negative visible noncontrast neck soft tissues.  Upper chest: Mild T1 superior endplate compression fracture is new since last year (series 6, image 36). Chronic cervicothoracic junction level facet arthropathy. T2 vertebra appears intact. Stable apical lung scarring.  IMPRESSION: 1. Mild T1 superior endplate compression fracture is new since last year. No retropulsion or complicating features. Underlying Osteopenia. 2. No acute traumatic injury identified in the cervical spine. Mild for age cervical spine degeneration.  Electronically Signed   By: Odessa Fleming M.D.   On: 01/02/2023 06:35    Assessment & Plan:    See Problem List for Assessment and Plan of chronic medical problems.

## 2023-01-11 NOTE — Patient Instructions (Incomplete)
      Medications changes include :       A referral was ordered and someone will call you to schedule an appointment.     No follow-ups on file.

## 2023-01-12 ENCOUNTER — Ambulatory Visit (INDEPENDENT_AMBULATORY_CARE_PROVIDER_SITE_OTHER): Payer: Medicare Other | Admitting: Internal Medicine

## 2023-01-12 VITALS — BP 140/58 | HR 78 | Temp 97.8°F | Ht 63.0 in | Wt 120.0 lb

## 2023-01-12 DIAGNOSIS — Z7984 Long term (current) use of oral hypoglycemic drugs: Secondary | ICD-10-CM

## 2023-01-12 DIAGNOSIS — I10 Essential (primary) hypertension: Secondary | ICD-10-CM | POA: Diagnosis not present

## 2023-01-12 DIAGNOSIS — E118 Type 2 diabetes mellitus with unspecified complications: Secondary | ICD-10-CM

## 2023-01-12 DIAGNOSIS — S0181XA Laceration without foreign body of other part of head, initial encounter: Secondary | ICD-10-CM | POA: Diagnosis not present

## 2023-01-12 DIAGNOSIS — S065XAA Traumatic subdural hemorrhage with loss of consciousness status unknown, initial encounter: Secondary | ICD-10-CM | POA: Insufficient documentation

## 2023-01-12 MED ORDER — LANCETS MISC. MISC: 3 refills | Status: DC

## 2023-01-12 MED ORDER — LANCET DEVICE MISC: 0 refills | Status: DC

## 2023-01-12 MED ORDER — BLOOD GLUCOSE MONITORING SUPPL DEVI: 0 refills | Status: DC

## 2023-01-12 MED ORDER — BLOOD GLUCOSE TEST VI STRP: ORAL_STRIP | 3 refills | Status: DC

## 2023-01-12 NOTE — Assessment & Plan Note (Signed)
Chronic   Lab Results  Component Value Date   HGBA1C 6.9 (H) 09/27/2022   Sugars controlled Continue metformin XR 500 mg daily Encouraged regular exercise, diabetic diet Needs glucometer - does not have one that works - rx sent to pharmacy

## 2023-01-12 NOTE — Assessment & Plan Note (Addendum)
Acute As a result of fall at home 9/29 Evaluated in the ED CT revealed no fracture of the skull, but she does have a subdural hematoma 8 mm in thickness without shift Laceration repaired with sutures-healing well - there is a scab - no evidence of an infection She will monitor

## 2023-01-12 NOTE — Assessment & Plan Note (Signed)
Chronic BP well controlled Continue amodipine 2.5 mg daily, losartan 100 mg daily, metoprolol xl 50 mg daily

## 2023-01-12 NOTE — Assessment & Plan Note (Addendum)
New As a result of fall 9/29 Evaluated in ED-CT scan showed subdural hematoma up to 8 mm in thickness without shift Has followed up with neurosurgery - they have ordered a Ct scan for the week of 10/28 for follow up She has no concerning symptoms - no headaches, dizziness/lightheadedness, vision changes

## 2023-01-16 NOTE — Progress Notes (Signed)
Subjective:  Patient ID: Tami Jacobson, female    DOB: 1928/01/11,  MRN: [id number]  Tami Jacobson presents to clinic today for preventative diabetic foot care and painful, discolored, thick toenails which interfere with daily activities. Patient states she had a fall since our last visit. Sustained a head injury and is being monitored and will have a follow up CT. States she feels fine today. Chief Complaint  Patient presents with   Diabetes    BS- A1C-7 PCPV-   New problem(s): None.   PCP is Burns, Bobette Mo, MD.  Allergies  Allergen Reactions   Statins Other (See Comments)    GI UPSET   Effexor [Venlafaxine]     Constipation, shaking, dizzy   Gabapentin     drowsniess   Cephalexin Other (See Comments)    Reaction not recalled by the patient   Codeine Other (See Comments)    Reaction not recalled by the patient   Lansoprazole Other (See Comments)    Reaction not recalled by the patient   Latex Rash        Levofloxacin Other (See Comments)    Reaction not recalled by the patient   Nitrofurantoin Other (See Comments)    Reaction not recalled by the patient   Rofecoxib Other (See Comments)    Reaction not recalled by the patient     Review of Systems: Negative except as noted in the HPI.  Objective: No changes noted in today's physical examination. There were no vitals filed for this visit. Tami Jacobson is a pleasant 89 y.o. female WD, WN in NAD. AAO x 3.  Vascular Examination: CFT <3 seconds b/l. DP/PT pulses faintly palpable b/l. Skin temperature gradient warm to warm b/l. No pain with calf compression. No ischemia or gangrene. No cyanosis or clubbing noted b/l. No edema noted b/l LE.   Neurological Examination: Sensation grossly intact b/l with 10 gram monofilament. Vibratory sensation intact b/l.   Dermatological Examination: Pedal skin warm and supple b/l.   No open wounds. No interdigital macerations.  Toenails 1-5 b/l thick,  discolored, elongated with subungual debris and pain on dorsal palpation.    Anonychia noted bilateral great toes. Nailbed(s) epithelialized.  No hyperkeratotic nor porokeratotic lesions present on today's visit.  Musculoskeletal Examination: Muscle strength 5/5 to all lower extremity muscle groups bilaterally. HAV with bunion deformity noted b/l LE.  Radiographs: None  Last A1c:      Latest Ref Rng & Units 09/27/2022   12:01 PM  Hemoglobin A1C  Hemoglobin-A1c 4.6 - 6.5 % 6.9    Assessment/Plan: 1. Pain due to onychomycosis of toenails of both feet   2. Controlled type 2 diabetes mellitus with complication, without long-term current use of insulin (HCC)     -Patient was evaluated and treated. All patient's and/or POA's questions/concerns answered on today's visit. -Continue supportive shoe gear daily. -Mycotic toenails 2-5 bilaterally were debrided in length and girth with sterile nail nippers and dremel without iatrogenic bleeding. -Patient/POA to call should there be question/concern in the interim.   Return in about 3 months (around 04/13/2023).  Freddie Breech, DPM

## 2023-01-19 ENCOUNTER — Ambulatory Visit: Payer: Medicare Other

## 2023-01-20 ENCOUNTER — Other Ambulatory Visit: Payer: Self-pay | Admitting: Internal Medicine

## 2023-01-26 DIAGNOSIS — Z85828 Personal history of other malignant neoplasm of skin: Secondary | ICD-10-CM | POA: Diagnosis not present

## 2023-01-26 DIAGNOSIS — L814 Other melanin hyperpigmentation: Secondary | ICD-10-CM | POA: Diagnosis not present

## 2023-01-26 DIAGNOSIS — L570 Actinic keratosis: Secondary | ICD-10-CM | POA: Diagnosis not present

## 2023-01-26 DIAGNOSIS — L821 Other seborrheic keratosis: Secondary | ICD-10-CM | POA: Diagnosis not present

## 2023-01-27 DIAGNOSIS — Z961 Presence of intraocular lens: Secondary | ICD-10-CM | POA: Diagnosis not present

## 2023-01-27 DIAGNOSIS — E119 Type 2 diabetes mellitus without complications: Secondary | ICD-10-CM | POA: Diagnosis not present

## 2023-01-27 DIAGNOSIS — H5000 Unspecified esotropia: Secondary | ICD-10-CM | POA: Diagnosis not present

## 2023-01-27 DIAGNOSIS — H52203 Unspecified astigmatism, bilateral: Secondary | ICD-10-CM | POA: Diagnosis not present

## 2023-01-27 LAB — HM DIABETES EYE EXAM

## 2023-02-02 ENCOUNTER — Ambulatory Visit
Admission: RE | Admit: 2023-02-02 | Discharge: 2023-02-02 | Disposition: A | Discharge status: Home / Self Care | Payer: Medicare Other | Source: Ambulatory Visit | Attending: Neurosurgery | Admitting: Neurosurgery

## 2023-02-02 DIAGNOSIS — S065XAA Traumatic subdural hemorrhage with loss of consciousness status unknown, initial encounter: Secondary | ICD-10-CM

## 2023-02-02 DIAGNOSIS — I629 Nontraumatic intracranial hemorrhage, unspecified: Secondary | ICD-10-CM | POA: Diagnosis not present

## 2023-02-02 DIAGNOSIS — G939 Disorder of brain, unspecified: Secondary | ICD-10-CM | POA: Diagnosis not present

## 2023-02-07 DIAGNOSIS — S065XAA Traumatic subdural hemorrhage with loss of consciousness status unknown, initial encounter: Secondary | ICD-10-CM | POA: Diagnosis not present

## 2023-02-15 ENCOUNTER — Other Ambulatory Visit: Payer: Self-pay | Admitting: Neurosurgery

## 2023-02-15 DIAGNOSIS — S065XAA Traumatic subdural hemorrhage with loss of consciousness status unknown, initial encounter: Secondary | ICD-10-CM

## 2023-03-12 ENCOUNTER — Other Ambulatory Visit: Payer: Self-pay | Admitting: Cardiology

## 2023-03-12 DIAGNOSIS — R002 Palpitations: Secondary | ICD-10-CM

## 2023-03-25 ENCOUNTER — Ambulatory Visit: Payer: Medicare Other | Admitting: Internal Medicine

## 2023-04-04 ENCOUNTER — Ambulatory Visit
Admission: RE | Admit: 2023-04-04 | Discharge: 2023-04-04 | Disposition: A | Discharge status: Home / Self Care | Payer: Medicare Other | Source: Ambulatory Visit | Attending: Neurosurgery

## 2023-04-04 DIAGNOSIS — Z09 Encounter for follow-up examination after completed treatment for conditions other than malignant neoplasm: Secondary | ICD-10-CM | POA: Diagnosis not present

## 2023-04-04 DIAGNOSIS — S065XAA Traumatic subdural hemorrhage with loss of consciousness status unknown, initial encounter: Secondary | ICD-10-CM

## 2023-04-04 DIAGNOSIS — I6200 Nontraumatic subdural hemorrhage, unspecified: Secondary | ICD-10-CM | POA: Diagnosis not present

## 2023-04-06 ENCOUNTER — Other Ambulatory Visit: Payer: Self-pay | Admitting: Cardiology

## 2023-04-11 DIAGNOSIS — D696 Thrombocytopenia, unspecified: Secondary | ICD-10-CM | POA: Insufficient documentation

## 2023-04-11 NOTE — Progress Notes (Signed)
 Subjective:    Patient ID: Tami Jacobson, female    DOB: 1927-12-07, 89 y.o.   MRN: [id number]     HPI Tami Jacobson is here for follow up of her chronic medical problems.  Feeling good - no concerns.    No falls recently.  Had Ct recently with neurosurgery to re-eval the subdural hematoma.   Medications and allergies reviewed with patient and updated if appropriate.  Current Outpatient Medications on File Prior to Visit  Medication Sig Dispense Refill   acetaminophen  (TYLENOL  8 HOUR) 650 MG CR tablet Take 2 tablets every 8 hours by oral route.     amLODipine  (NORVASC ) 2.5 MG tablet TAKE 1 TABLET BY MOUTH DAILY FOR BLOOD PRESSURE 100 tablet 2   Blood Glucose Monitoring Suppl DEVI UAD to check sugars.  E11.9.  May substitute to any manufacturer covered by patient's insurance. 1 each 0   clobetasol cream (TEMOVATE) 0.05 % APPLY CREAM TOPICALLY TWICE DAILY AS NEEDED FOR IRRITATION FROM LICHEN SCLEROSUS     cyanocobalamin  (,VITAMIN B-12,) 1000 MCG/ML injection Inject 1,000 mcg into the muscle every 30 (thirty) days.     flecainide  (TAMBOCOR ) 50 MG tablet TAKE 1 TABLET BY MOUTH TWICE  DAILY 200 tablet 2   Glucose Blood (BLOOD GLUCOSE TEST STRIPS) STRP UAD to check sugars daily and prn.  E11.9.  May substitute to any manufacturer covered by patient's insurance. 100 strip 3   Lancet Device MISC UAD for help check sugars.  E11.9.  May substitute to any manufacturer covered by patient's insurance. 1 each 0   Lancets Misc. MISC UAD to check sugars daily and prn.  E11.9.  May substitute to any manufacturer covered by patient's insurance. 100 each 3   losartan  (COZAAR ) 100 MG tablet TAKE 1 TABLET BY MOUTH DAILY FOR BLOOD PRESSURE 100 tablet 2   metFORMIN  (GLUCOPHAGE -XR) 500 MG 24 hr tablet Take 1 tablet (500 mg total) by mouth daily with breakfast. For your sugar 100 tablet 2   metoprolol  succinate (TOPROL -XL) 50 MG 24 hr tablet TAKE 1 TABLET BY MOUTH DAILY 100 tablet 2   omeprazole   (PRILOSEC) 40 MG capsule TAKE 1 CAPSULE BY MOUTH DAILY 100 capsule 2   No current facility-administered medications on file prior to visit.     Review of Systems  Constitutional:  Negative for fever.  HENT:  Positive for rhinorrhea.   Eyes:  Positive for discharge (watery eyes).  Respiratory:  Negative for cough, shortness of breath and wheezing.   Cardiovascular:  Negative for chest pain, palpitations and leg swelling.  Neurological:  Negative for light-headedness and headaches.       Objective:   Vitals:   04/12/23 1352  BP: 130/66  Pulse: 62  Temp: 97.9 F (36.6 C)  SpO2: 94%   BP Readings from Last 3 Encounters:  04/12/23 130/66  01/12/23 (!) 140/58  01/02/23 (!) 117/96   Wt Readings from Last 3 Encounters:  04/12/23 119 lb (54 kg)  01/12/23 120 lb (54.4 kg)  01/02/23 116 lb (52.6 kg)   Body mass index is 21.08 kg/m.    Physical Exam Constitutional:      General: She is not in acute distress.    Appearance: Normal appearance.  HENT:     Head: Normocephalic and atraumatic.  Eyes:     Conjunctiva/sclera: Conjunctivae normal.  Cardiovascular:     Rate and Rhythm: Normal rate and regular rhythm.     Heart sounds: Murmur (2/6 sys) heard.  Pulmonary:  Effort: Pulmonary effort is normal. No respiratory distress.     Breath sounds: Normal breath sounds. No wheezing.  Musculoskeletal:     Cervical back: Neck supple.     Right lower leg: No edema.     Left lower leg: No edema.  Lymphadenopathy:     Cervical: No cervical adenopathy.  Skin:    General: Skin is warm and dry.     Findings: No rash.  Neurological:     Mental Status: She is alert. Mental status is at baseline.  Psychiatric:        Mood and Affect: Mood normal.        Behavior: Behavior normal.        Lab Results  Component Value Date   WBC 7.8 01/02/2023   HGB 11.6 (L) 01/02/2023   HCT 36.6 01/02/2023   PLT 145 (L) 01/02/2023   GLUCOSE 161 (H) 01/02/2023   CHOL 173 02/22/2019    TRIG 157.0 (H) 02/22/2019   HDL 50.40 02/22/2019   LDLDIRECT 128.3 12/26/2012   LDLCALC 92 02/22/2019   ALT 11 09/27/2022   AST 16 09/27/2022   NA 134 (L) 01/02/2023   K 4.1 01/02/2023   CL 102 01/02/2023   CREATININE 0.79 01/02/2023   BUN 19 01/02/2023   CO2 21 (L) 01/02/2023   TSH 2.519 05/15/2019   HGBA1C 6.9 (H) 09/27/2022   MICROALBUR 1.0 08/23/2018     Assessment & Plan:    See Problem List for Assessment and Plan of chronic medical problems.

## 2023-04-11 NOTE — Patient Instructions (Addendum)
     B12 injection today   Blood work was ordered.       Medications changes include :   None     Return in 6 months (on 10/10/2023) for Physical Exam.

## 2023-04-12 ENCOUNTER — Ambulatory Visit: Payer: Medicare Other | Admitting: Podiatry

## 2023-04-12 ENCOUNTER — Encounter: Payer: Self-pay | Admitting: Internal Medicine

## 2023-04-12 ENCOUNTER — Ambulatory Visit (INDEPENDENT_AMBULATORY_CARE_PROVIDER_SITE_OTHER): Payer: Medicare Other | Admitting: Internal Medicine

## 2023-04-12 VITALS — BP 130/66 | HR 62 | Temp 97.9°F | Ht 63.0 in | Wt 119.0 lb

## 2023-04-12 DIAGNOSIS — I482 Chronic atrial fibrillation, unspecified: Secondary | ICD-10-CM

## 2023-04-12 DIAGNOSIS — I10 Essential (primary) hypertension: Secondary | ICD-10-CM | POA: Diagnosis not present

## 2023-04-12 DIAGNOSIS — E118 Type 2 diabetes mellitus with unspecified complications: Secondary | ICD-10-CM

## 2023-04-12 DIAGNOSIS — K219 Gastro-esophageal reflux disease without esophagitis: Secondary | ICD-10-CM | POA: Diagnosis not present

## 2023-04-12 DIAGNOSIS — E538 Deficiency of other specified B group vitamins: Secondary | ICD-10-CM

## 2023-04-12 DIAGNOSIS — D649 Anemia, unspecified: Secondary | ICD-10-CM

## 2023-04-12 DIAGNOSIS — D696 Thrombocytopenia, unspecified: Secondary | ICD-10-CM

## 2023-04-12 DIAGNOSIS — I5032 Chronic diastolic (congestive) heart failure: Secondary | ICD-10-CM

## 2023-04-12 DIAGNOSIS — Z7984 Long term (current) use of oral hypoglycemic drugs: Secondary | ICD-10-CM

## 2023-04-12 LAB — HEMOGLOBIN A1C: Hgb A1c MFr Bld: 6.8 % — ABNORMAL HIGH (ref 4.6–6.5)

## 2023-04-12 LAB — COMPREHENSIVE METABOLIC PANEL
ALT: 11 U/L (ref 0–35)
AST: 16 U/L (ref 0–37)
Albumin: 4.1 g/dL (ref 3.5–5.2)
Alkaline Phosphatase: 71 U/L (ref 39–117)
BUN: 16 mg/dL (ref 6–23)
CO2: 25 meq/L (ref 19–32)
Calcium: 9.9 mg/dL (ref 8.4–10.5)
Chloride: 101 meq/L (ref 96–112)
Creatinine, Ser: 0.95 mg/dL (ref 0.40–1.20)
GFR: 51.66 mL/min — ABNORMAL LOW (ref >60.00)
Glucose, Bld: 155 mg/dL — ABNORMAL HIGH (ref 70–99)
Potassium: 4.7 meq/L (ref 3.5–5.1)
Sodium: 137 meq/L (ref 135–145)
Total Bilirubin: 0.5 mg/dL (ref 0.2–1.2)
Total Protein: 7 g/dL (ref 6.0–8.3)

## 2023-04-12 LAB — CBC WITH DIFFERENTIAL/PLATELET
Basophils Absolute: 0 10*3/uL (ref 0.0–0.1)
Basophils Relative: 0.8 % (ref 0.0–3.0)
Eosinophils Absolute: 0.2 10*3/uL (ref 0.0–0.7)
Eosinophils Relative: 4 % (ref 0.0–5.0)
HCT: 31.6 % — ABNORMAL LOW (ref 36.0–46.0)
Hemoglobin: 11 g/dL — ABNORMAL LOW (ref 12.0–15.0)
Lymphocytes Relative: 18.6 % (ref 12.0–46.0)
Lymphs Abs: 0.9 10*3/uL (ref 0.7–4.0)
MCHC: 34.7 g/dL (ref 30.0–36.0)
MCV: 95.1 fL (ref 78.0–100.0)
Monocytes Absolute: 0.6 10*3/uL (ref 0.1–1.0)
Monocytes Relative: 11.6 % (ref 3.0–12.0)
Neutro Abs: 3.3 10*3/uL (ref 1.4–7.7)
Neutrophils Relative %: 65 % (ref 43.0–77.0)
Platelets: 170 10*3/uL (ref 150.0–400.0)
RBC: 3.33 Mil/uL — ABNORMAL LOW (ref 3.87–5.11)
RDW: 13.9 % (ref 11.5–15.5)
WBC: 5 10*3/uL (ref 4.0–10.5)

## 2023-04-12 LAB — VITAMIN B12: Vitamin B-12: >1537 pg/mL — ABNORMAL HIGH (ref 211–911)

## 2023-04-12 MED ORDER — CYANOCOBALAMIN 1000 MCG/ML IJ SOLN
1000.0000 ug | Freq: Once | INTRAMUSCULAR | Status: AC
Start: 2023-04-12 — End: 2023-04-12
  Administered 2023-04-12: 1000 ug via INTRAMUSCULAR

## 2023-04-12 NOTE — Assessment & Plan Note (Signed)
 Chronic GERD controlled Continue omeprazole 40 mg daily

## 2023-04-12 NOTE — Addendum Note (Signed)
 Addended by: Karma Ganja on: 04/12/2023 03:02 PM   Modules accepted: Orders

## 2023-04-12 NOTE — Assessment & Plan Note (Signed)
Chronic Euvolemic Continue metoprolol XL 25 mg daily

## 2023-04-12 NOTE — Assessment & Plan Note (Signed)
 Chronic BP well-controlled Cbc, cmp Continue amodipine 2.5 mg daily, losartan 100 mg daily, metoprolol xl 50 mg daily

## 2023-04-12 NOTE — Assessment & Plan Note (Signed)
 Chronic  Lab Results  Component Value Date   HGBA1C 6.9 (H) 09/27/2022   Sugars controlled Continue metformin XR 500 mg daily Encouraged regular exercise, diabetic diet

## 2023-04-12 NOTE — Assessment & Plan Note (Signed)
 Chronic Follows with cardiology-Dr. Jens Som On flecainide, metoprolol XL 50 mg daily Cbc, cmp

## 2023-04-12 NOTE — Assessment & Plan Note (Signed)
Chronic Mild cbc 

## 2023-04-12 NOTE — Assessment & Plan Note (Signed)
 Chronic Taking B12 orally  B12 injection today - will give injections when she is here - has difficulty getting here regularly

## 2023-04-25 ENCOUNTER — Ambulatory Visit: Payer: Medicare Other | Admitting: Podiatry

## 2023-04-25 ENCOUNTER — Encounter: Payer: Self-pay | Admitting: Podiatry

## 2023-04-25 DIAGNOSIS — B351 Tinea unguium: Secondary | ICD-10-CM | POA: Diagnosis not present

## 2023-04-25 DIAGNOSIS — M79675 Pain in left toe(s): Secondary | ICD-10-CM | POA: Diagnosis not present

## 2023-04-25 DIAGNOSIS — M79674 Pain in right toe(s): Secondary | ICD-10-CM

## 2023-04-25 DIAGNOSIS — E118 Type 2 diabetes mellitus with unspecified complications: Secondary | ICD-10-CM

## 2023-04-25 NOTE — Progress Notes (Signed)
This patient returns to my office for at risk foot care.  This patient requires this care by a professional since this patient will be at risk due to having diabetes and thrombocytopenia. This patient is unable to cut nails herself since the patient cannot reach her nails.These nails are painful walking and wearing shoes.  This patient presents for at risk foot care today.  General Appearance  Alert, conversant and in no acute stress.  Vascular  Dorsalis pedis and posterior tibial  pulses are  weakly palpable  bilaterally.  Capillary return is within normal limits  bilaterally. Cold feet. bilaterally.  Neurologic  Senn-Weinstein monofilament wire test within normal limits  bilaterally. Muscle power within normal limits bilaterally.  Nails Thick disfigured discolored nails with subungual debris  from hallux to fifth toes bilaterally. No evidence of bacterial infection or drainage bilaterally.  Orthopedic  No limitations of motion  feet .  No crepitus or effusions noted.  No bony pathology or digital deformities noted.  Skin  normotropic skin with no porokeratosis noted bilaterally.  No signs of infections or ulcers noted.     Onychomycosis  Pain in right toes  Pain in left toes  Consent was obtained for treatment procedures.   Mechanical debridement of nails 1-5  bilaterally performed with a nail nipper.  Filed with dremel without incident.    Return office visit    3 months                  Told patient to return for periodic foot care and evaluation due to potential at risk complications.   Helane Gunther DPM

## 2023-04-26 ENCOUNTER — Other Ambulatory Visit: Payer: Self-pay | Admitting: Internal Medicine

## 2023-05-02 ENCOUNTER — Telehealth: Payer: Self-pay

## 2023-05-02 ENCOUNTER — Telehealth: Payer: Self-pay | Admitting: Cardiology

## 2023-05-02 NOTE — Telephone Encounter (Signed)
Pt c/o medication issue:  1. Name of Medication:  metoprolol succinate (TOPROL-XL) 50 MG 24 hr tablet  2. How are you currently taking this medication (dosage and times per day)?   3. Are you having a reaction (difficulty breathing--STAT)?   4. What is your medication issue?   Patient would like to know when her Metoprolol was changed. Please advise.

## 2023-05-02 NOTE — Telephone Encounter (Signed)
Patient identification verified by 2 forms. Marilynn Rail, RN    Called and spoke to patient  Patient states:   -would  like to confirm dose for Metoprolol   -unsure if she should be taking 75mg  (1.5tablets) daily   -does not wish to wait until 11/2023 for follow up, would like appointment in February  Informed patient:   -Per chart review should be taking Metoprolol 50mg  daily   -Next available appointment with doctor Crenshaw 08/2023  Patient scheduled for 08/23/23 at 9am with Dr. Jens Som  Patient has no further questions at this time

## 2023-05-02 NOTE — Telephone Encounter (Unsigned)
Copied from CRM [id number]. Topic: Clinical - Medical Advice >> May 02, 2023  3:22 PM Melissa C wrote: Reason for CRM: patient was in earlier this month and forgot to ask the doctor about getting an RSV shot, she would like to get one and was hoping that someone could call her about getting it. Please advise with patient. Thank you

## 2023-05-03 NOTE — Telephone Encounter (Signed)
Message left for patient to proceed with getting RSV vaccine and she can get this from any local pharmacy of her choice.

## 2023-05-05 ENCOUNTER — Other Ambulatory Visit (HOSPITAL_BASED_OUTPATIENT_CLINIC_OR_DEPARTMENT_OTHER): Payer: Self-pay

## 2023-05-05 MED ORDER — COVID-19 MRNA VAC-TRIS(PFIZER) 30 MCG/0.3ML IM SUSY
0.3000 mL | PREFILLED_SYRINGE | Freq: Once | INTRAMUSCULAR | 0 refills | Status: AC
  Filled 2023-05-05: qty 0.3, 1d supply, fill #0

## 2023-05-09 ENCOUNTER — Ambulatory Visit: Payer: Medicare Other

## 2023-05-12 ENCOUNTER — Other Ambulatory Visit (HOSPITAL_BASED_OUTPATIENT_CLINIC_OR_DEPARTMENT_OTHER): Payer: Self-pay

## 2023-05-12 MED ORDER — AREXVY 120 MCG/0.5ML IM SUSR
0.5000 mL | Freq: Once | INTRAMUSCULAR | 0 refills | Status: AC
  Filled 2023-05-12: qty 0.5, 1d supply, fill #0

## 2023-07-25 ENCOUNTER — Ambulatory Visit (INDEPENDENT_AMBULATORY_CARE_PROVIDER_SITE_OTHER): Payer: Medicare Other | Admitting: Podiatry

## 2023-07-25 ENCOUNTER — Encounter: Payer: Self-pay | Admitting: Podiatry

## 2023-07-25 DIAGNOSIS — M79674 Pain in right toe(s): Secondary | ICD-10-CM | POA: Diagnosis not present

## 2023-07-25 DIAGNOSIS — B351 Tinea unguium: Secondary | ICD-10-CM | POA: Diagnosis not present

## 2023-07-25 DIAGNOSIS — E118 Type 2 diabetes mellitus with unspecified complications: Secondary | ICD-10-CM

## 2023-07-25 DIAGNOSIS — M79675 Pain in left toe(s): Secondary | ICD-10-CM

## 2023-07-25 NOTE — Progress Notes (Signed)
 This patient returns to my office for at risk foot care.  This patient requires this care by a professional since this patient will be at risk due to having diabetes and thrombocytopenia. This patient is unable to cut nails herself since the patient cannot reach her nails.These nails are painful walking and wearing shoes.  This patient presents for at risk foot care today.  General Appearance  Alert, conversant and in no acute stress.  Vascular  Dorsalis pedis and posterior tibial  pulses are  weakly palpable  bilaterally.  Capillary return is within normal limits  bilaterally. Cold feet. bilaterally.  Neurologic  Senn-Weinstein monofilament wire test within normal limits  bilaterally. Muscle power within normal limits bilaterally.  Nails Thick disfigured discolored nails with subungual debris  from hallux to fifth toes bilaterally. No evidence of bacterial infection or drainage bilaterally.  Orthopedic  No limitations of motion  feet .  No crepitus or effusions noted.  No bony pathology or digital deformities noted.  Skin  normotropic skin with no porokeratosis noted bilaterally.  No signs of infections or ulcers noted.     Onychomycosis  Pain in right toes  Pain in left toes  Consent was obtained for treatment procedures.   Mechanical debridement of nails 1-5  bilaterally performed with a nail nipper.  Filed with dremel without incident.    Return office visit    3 months                  Told patient to return for periodic foot care and evaluation due to potential at risk complications.   Helane Gunther DPM

## 2023-07-27 DIAGNOSIS — L570 Actinic keratosis: Secondary | ICD-10-CM | POA: Diagnosis not present

## 2023-07-27 DIAGNOSIS — Z85828 Personal history of other malignant neoplasm of skin: Secondary | ICD-10-CM | POA: Diagnosis not present

## 2023-07-27 DIAGNOSIS — D692 Other nonthrombocytopenic purpura: Secondary | ICD-10-CM | POA: Diagnosis not present

## 2023-07-27 DIAGNOSIS — D0439 Carcinoma in situ of skin of other parts of face: Secondary | ICD-10-CM | POA: Diagnosis not present

## 2023-07-27 DIAGNOSIS — L821 Other seborrheic keratosis: Secondary | ICD-10-CM | POA: Diagnosis not present

## 2023-08-09 NOTE — Progress Notes (Signed)
 HPI: FU palpitations and cerebrovascular disease; history of PACs and PVCs with normal LV function. She had significant palpitations in the past despite beta-blockade. She is therefore on flecainide  50 mg p.o. b.i.d. A previous catheterization in June 2000 showed normal coronary arteries and normal LV function. Nuclear study February 2017 showed ejection fraction 72% and normal perfusion. Carotid dopplers 2/18 showed 1-39 bilateral stenosis. Chest CT April 2021 showed biapical scarring and coronary artery calcification. Echocardiogram December 2021 showed normal LV function, severe left ventricular hypertrophy, grade 2 diastolic dysfunction, mild left atrial enlargement, mild mitral regurgitation.  Patient fell in September 2024 resulting in subdural hematoma.  Since I last saw her, she denies dyspnea, chest pain, palpitations or syncope.  Current Outpatient Medications  Medication Sig Dispense Refill   acetaminophen  (TYLENOL  8 HOUR) 650 MG CR tablet Take 2 tablets every 8 hours by oral route.     amLODipine  (NORVASC ) 2.5 MG tablet TAKE 1 TABLET BY MOUTH DAILY FOR BLOOD PRESSURE 100 tablet 2   Blood Glucose Monitoring Suppl DEVI UAD to check sugars.  E11.9.  May substitute to any manufacturer covered by patient's insurance. 1 each 0   clobetasol cream (TEMOVATE) 0.05 % APPLY CREAM TOPICALLY TWICE DAILY AS NEEDED FOR IRRITATION FROM LICHEN SCLEROSUS     cyanocobalamin  (,VITAMIN B-12,) 1000 MCG/ML injection Inject 1,000 mcg into the muscle every 30 (thirty) days.     flecainide  (TAMBOCOR ) 50 MG tablet TAKE 1 TABLET BY MOUTH TWICE  DAILY 200 tablet 2   Glucose Blood (BLOOD GLUCOSE TEST STRIPS) STRP UAD to check sugars daily and prn.  E11.9.  May substitute to any manufacturer covered by patient's insurance. 100 strip 3   Lancet Device MISC UAD for help check sugars.  E11.9.  May substitute to any manufacturer covered by patient's insurance. 1 each 0   Lancets Misc. MISC UAD to check sugars daily and  prn.  E11.9.  May substitute to any manufacturer covered by patient's insurance. 100 each 3   losartan  (COZAAR ) 100 MG tablet TAKE 1 TABLET BY MOUTH DAILY FOR BLOOD PRESSURE 100 tablet 2   metFORMIN  (GLUCOPHAGE -XR) 500 MG 24 hr tablet TAKE 1 TABLET BY MOUTH DAILY  WITH BREAKFAST FOR YOUR SUGAR 100 tablet 2   metoprolol  succinate (TOPROL -XL) 50 MG 24 hr tablet TAKE 1 TABLET BY MOUTH DAILY 100 tablet 2   omeprazole  (PRILOSEC) 40 MG capsule TAKE 1 CAPSULE BY MOUTH DAILY 100 capsule 2   No current facility-administered medications for this visit.     Past Medical History:  Diagnosis Date   Arthritis    left hip   Breast cancer (HCC)    Breast cancer, right (HCC) 11/2016   Family history of breast cancer    Family history of colon cancer    Family history of pancreatic cancer    GERD (gastroesophageal reflux disease)    Non-insulin  dependent type 2 diabetes mellitus (HCC)    PAC (premature atrial contraction)    Personal history of radiation therapy 12/2016   Premature ventricular contraction    Seasonal allergies    Sensitive skin    Vitamin B12 deficiency     Past Surgical History:  Procedure Laterality Date   ABDOMINAL HYSTERECTOMY  1979   partial   BREAST BIOPSY  07/02/2011   Procedure: BREAST BIOPSY WITH NEEDLE LOCALIZATION;  Surgeon: Thayne Fine, MD;  Location: [facility] OR;  Service: General;  Laterality: Left;  left breast atypical hyperplasia needle localization biopsy   BREAST BIOPSY  Left 06/13/2006   BREAST BIOPSY Right 06/27/2008   BREAST BIOPSY Right 09/27/2016   BREAST EXCISIONAL BIOPSY Left 07/02/2011   BREAST LUMPECTOMY Left 07/04/2006   BREAST LUMPECTOMY Right 06/26/2008   BREAST LUMPECTOMY Right 09/27/2016   BREAST LUMPECTOMY WITH RADIOACTIVE SEED LOCALIZATION Right 11/18/2016   Procedure: RIGHT BREAST LUMPECTOMY WITH RADIOACTIVE SEED LOCALIZATION ERAS PATHWAY;  Surgeon: Juanita Norlander, MD;  Location: [facility];  Service: General;  Laterality: Right;   ERAS PATHWAY   CATARACT EXTRACTION Bilateral    CHOLECYSTECTOMY, LAPAROSCOPIC  07/07/2001   had appendex taking out at the same time   ESOPHAGEAL DILATION  2006   LAPAROSCOPIC APPENDECTOMY  07/07/2001   TONSILLECTOMY  1942    Social History   Socioeconomic History   Marital status: Divorced    Spouse name: Andy Bannister   Number of children: 1   Years of education: Not on file   Highest education level: Not on file  Occupational History   Occupation: retire  Tobacco Use   Smoking status: Never   Smokeless tobacco: Never  Vaping Use   Vaping status: Never Used  Substance and Sexual Activity   Alcohol use: No    Alcohol/week: 0.0 standard drinks of alcohol   Drug use: No   Sexual activity: Not Currently  Other Topics Concern   Not on file  Social History Narrative   Not on file   Social Drivers of Health   Financial Resource Strain: Low Risk  (10/06/2022)   Overall Financial Resource Strain (CARDIA)    Difficulty of Paying Living Expenses: Not hard at all  Food Insecurity: No Food Insecurity (10/06/2022)   Hunger Vital Sign    Worried About Radiation protection practitioner of Food in the Last Year: Never true    Ran Out of Food in the Last Year: Never true  Transportation Needs: No Transportation Needs (10/06/2022)   PRAPARE - Administrator, Civil Service (Medical): No    Lack of Transportation (Non-Medical): No  Physical Activity: Inactive (10/06/2022)   Exercise Vital Sign    Days of Exercise per Week: 0 days    Minutes of Exercise per Session: 0 min  Stress: No Stress Concern Present (10/06/2022)   [employer] of Occupational Health - Occupational Stress Questionnaire    Feeling of Stress : Not at all  Social Connections: Moderately Integrated (10/06/2022)   Social Connection and Isolation Panel [NHANES]    Frequency of Communication with Friends and Family: Three times a week    Frequency of Social Gatherings with Friends and Family: Three times a week    Attends Religious  Services: More than 4 times per year    Active Member of Clubs or Organizations: Yes    Attends Banker Meetings: More than 4 times per year    Marital Status: Widowed  Intimate Partner Violence: Not At Risk (10/06/2022)   Humiliation, Afraid, Rape, and Kick questionnaire    Fear of Current or Ex-Partner: No    Emotionally Abused: No    Physically Abused: No    Sexually Abused: No    Family History  Problem Relation Age of Onset   Colon cancer Mother 83   Heart attack Mother    Breast cancer Sister 42   Heart disease Maternal Uncle        CABG   Breast cancer Sister 17   Heart disease Brother    Other Father        suicide   Breast cancer  Sister 69   Heart disease Sister    COPD Brother    Heart disease Brother    Breast cancer Other 62       niece - brother's daughter   Pancreatic cancer Other        brother's son, dx in his 80s    ROS: no fevers or chills, productive cough, hemoptysis, dysphasia, odynophagia, melena, hematochezia, dysuria, hematuria, rash, seizure activity, orthopnea, PND, pedal edema, claudication. Remaining systems are negative.  Physical Exam: Well-developed well-nourished in no acute distress.  Skin is warm and dry.  HEENT is normal.  Neck is supple.  Chest is clear to auscultation with normal expansion.  Cardiovascular exam is regular rate and rhythm.  Abdominal exam nontender or distended. No masses palpated. Extremities show no edema. neuro grossly intact  EKG Interpretation Date/Time:  Tuesday Aug 23 2023 09:19:05 EDT Ventricular Rate:  69 PR Interval:  216 QRS Duration:  98 QT Interval:  408 QTC Calculation: 437 R Axis:   -44  Text Interpretation: Sinus rhythm with 1st degree A-V block Left axis deviation Low voltage QRS Incomplete right bundle branch block Cannot rule out Inferior infarct , age undetermined Cannot rule out Anterior infarct , age undetermined Confirmed by Alexandria Angel ([id number]) on 08/23/2023 9:21:10 AM     A/P  1 coronary artery disease-this is based on a previous CT showing coronary calcification.  She is not having chest pain.  Continue low-dose aspirin .  Intolerant to statins.  2 palpitations-previous monitor demonstrated PACs and PVCs.  Continue flecainide  and beta-blocker.  Based on previous notes her palpitations were not controlled with beta-blocker alone.  She does have coronary calcification but has never had documented coronary disease and has tolerated flecainide  for years.  I have therefore elected to continue with this therapy.  3 hypertension-patient's blood pressure is controlled.  Continue present medical regimen.  Alexandria Angel, MD

## 2023-08-11 ENCOUNTER — Other Ambulatory Visit: Payer: Self-pay | Admitting: Internal Medicine

## 2023-08-23 ENCOUNTER — Ambulatory Visit: Payer: Medicare Other | Attending: Cardiology | Admitting: Cardiology

## 2023-08-23 ENCOUNTER — Encounter: Payer: Self-pay | Admitting: Cardiology

## 2023-08-23 VITALS — BP 122/50 | HR 92 | Ht 63.0 in | Wt 124.2 lb

## 2023-08-23 DIAGNOSIS — R002 Palpitations: Secondary | ICD-10-CM | POA: Diagnosis not present

## 2023-08-23 DIAGNOSIS — I2510 Atherosclerotic heart disease of native coronary artery without angina pectoris: Secondary | ICD-10-CM

## 2023-08-23 DIAGNOSIS — I10 Essential (primary) hypertension: Secondary | ICD-10-CM | POA: Diagnosis not present

## 2023-08-23 NOTE — Patient Instructions (Signed)
 Medication Instructions:   No changes  *If you need a refill on your cardiac medications before your next appointment, please call your pharmacy*   Lab Work:  Not needed  If you have labs (blood work) drawn today and your tests are completely normal, you will receive your results only by: MyChart Message (if you have MyChart) OR A paper copy in the mail If you have any lab test that is abnormal or we need to change your treatment, we will call you to review the results.   Testing/Procedures:  Not needed  Follow-Up: At [facility], you and your health needs are our priority.  As part of our continuing mission to provide you with exceptional heart care, we have created designated Provider Care Teams.  These Care Teams include your primary Cardiologist (physician) and Advanced Practice Providers (APPs -  Physician Assistants and Nurse Practitioners) who all work together to provide you with the care you need, when you need it.  We recommend signing up for the patient portal called "MyChart".  Sign up information is provided on this After Visit Summary.  MyChart is used to connect with patients for Virtual Visits (Telemedicine).  Patients are able to view lab/test results, encounter notes, upcoming appointments, etc.  Non-urgent messages can be sent to your provider as well.   To learn more about what you can do with MyChart, go to [email or web address].    Your next appointment:   12 month(s)  The format for your next appointment:   In Person  Provider:   Dr Alexandria Angel

## 2023-08-26 ENCOUNTER — Ambulatory Visit (INDEPENDENT_AMBULATORY_CARE_PROVIDER_SITE_OTHER)

## 2023-08-26 VITALS — BP 130/60 | HR 64 | Ht 62.0 in | Wt 126.0 lb

## 2023-08-26 DIAGNOSIS — Z0000 Encounter for general adult medical examination without abnormal findings: Secondary | ICD-10-CM | POA: Diagnosis not present

## 2023-08-26 NOTE — Progress Notes (Signed)
 Subjective:   Tami Jacobson is a 89 y.o. who presents for a Medicare Wellness preventive visit.  As a reminder, Annual Wellness Visits don't include a physical exam, and some assessments may be limited, especially if this visit is performed virtually. We may recommend an in-person follow-up visit with your provider if needed.  Visit Complete: In person   Persons Participating in Visit: Patient.  Cardiac Risk Factors include: advanced age (>87men, >4 women);hypertension;diabetes mellitus;dyslipidemia;Other (see comment), Risk factor comments: A-Fib, LVH, CHF     Objective:     Today's Vitals   08/26/23 1017  Weight: 126 lb (57.2 kg)  Height: 5\' 2"  (1.575 m)   Body mass index is 23.05 kg/m.     08/26/2023   10:10 AM 10/06/2022   12:22 PM 03/05/2022    5:39 PM 02/17/2022    1:32 PM 06/09/2021    2:04 PM 05/06/2020   12:49 PM 05/15/2019   10:08 AM  Advanced Directives  Does Patient Have a Medical Advance Directive? Yes Yes No No Yes Yes   Type of Estate agent of [place];Living will Healthcare Power of [employer] [place];Living will Healthcare Power of [place];Living will   Does patient want to make changes to medical advance directive? No - Patient declined No - Patient declined   No - Patient declined No - Patient declined   Copy of Healthcare Power of Attorney in Chart? Yes - validated most recent copy scanned in chart (See row information) No - copy requested   Yes - validated most recent copy scanned in chart (See row information) No - copy requested   Would patient like information on creating a medical advance directive?   No - Patient declined    No - Patient declined    Current Medications (verified) Outpatient Encounter Medications as of 08/26/2023  Medication Sig   acetaminophen  (TYLENOL  8 HOUR) 650 MG CR tablet Take 2 tablets every 8 hours by oral route.   amLODipine  (NORVASC ) 2.5 MG tablet TAKE 1 TABLET BY MOUTH DAILY  FOR BLOOD PRESSURE   Blood Glucose Monitoring Suppl DEVI UAD to check sugars.  E11.9.  May substitute to any manufacturer covered by patient's insurance.   clobetasol cream (TEMOVATE) 0.05 % APPLY CREAM TOPICALLY TWICE DAILY AS NEEDED FOR IRRITATION FROM LICHEN SCLEROSUS   cyanocobalamin  (,VITAMIN B-12,) 1000 MCG/ML injection Inject 1,000 mcg into the muscle every 30 (thirty) days.   flecainide  (TAMBOCOR ) 50 MG tablet TAKE 1 TABLET BY MOUTH TWICE  DAILY   Glucose Blood (BLOOD GLUCOSE TEST STRIPS) STRP UAD to check sugars daily and prn.  E11.9.  May substitute to any manufacturer covered by patient's insurance.   Lancet Device MISC UAD for help check sugars.  E11.9.  May substitute to any manufacturer covered by patient's insurance.   Lancets Misc. MISC UAD to check sugars daily and prn.  E11.9.  May substitute to any manufacturer covered by patient's insurance.   losartan  (COZAAR ) 100 MG tablet TAKE 1 TABLET BY MOUTH DAILY FOR BLOOD PRESSURE   metFORMIN  (GLUCOPHAGE -XR) 500 MG 24 hr tablet TAKE 1 TABLET BY MOUTH DAILY  WITH BREAKFAST FOR YOUR SUGAR   metoprolol  succinate (TOPROL -XL) 50 MG 24 hr tablet TAKE 1 TABLET BY MOUTH DAILY   omeprazole  (PRILOSEC) 40 MG capsule TAKE 1 CAPSULE BY MOUTH DAILY   No facility-administered encounter medications on file as of 08/26/2023.    Allergies (verified) Statins, Effexor  [venlafaxine ], Gabapentin , Cephalexin, Codeine, Lansoprazole, Latex, Levofloxacin, Nitrofurantoin, and  Rofecoxib   History: Past Medical History:  Diagnosis Date   Arthritis    left hip   Breast cancer (HCC)    Breast cancer, right (HCC) 11/2016   Family history of breast cancer    Family history of colon cancer    Family history of pancreatic cancer    GERD (gastroesophageal reflux disease)    Non-insulin  dependent type 2 diabetes mellitus (HCC)    PAC (premature atrial contraction)    Personal history of radiation therapy 12/2016   Premature ventricular contraction    Seasonal  allergies    Sensitive skin    Vitamin B12 deficiency    Past Surgical History:  Procedure Laterality Date   ABDOMINAL HYSTERECTOMY  1979   partial   BREAST BIOPSY  07/02/2011   Procedure: BREAST BIOPSY WITH NEEDLE LOCALIZATION;  Surgeon: Thayne Fine, MD;  Location: [facility] OR;  Service: General;  Laterality: Left;  left breast atypical hyperplasia needle localization biopsy   BREAST BIOPSY Left 06/13/2006   BREAST BIOPSY Right 06/27/2008   BREAST BIOPSY Right 09/27/2016   BREAST EXCISIONAL BIOPSY Left 07/02/2011   BREAST LUMPECTOMY Left 07/04/2006   BREAST LUMPECTOMY Right 06/26/2008   BREAST LUMPECTOMY Right 09/27/2016   BREAST LUMPECTOMY WITH RADIOACTIVE SEED LOCALIZATION Right 11/18/2016   Procedure: RIGHT BREAST LUMPECTOMY WITH RADIOACTIVE SEED LOCALIZATION ERAS PATHWAY;  Surgeon: Juanita Norlander, MD;  Location: [facility];  Service: General;  Laterality: Right;  ERAS PATHWAY   CATARACT EXTRACTION Bilateral    CHOLECYSTECTOMY, LAPAROSCOPIC  07/07/2001   had appendex taking out at the same time   ESOPHAGEAL DILATION  2006   LAPAROSCOPIC APPENDECTOMY  07/07/2001   TONSILLECTOMY  1942   Family History  Problem Relation Age of Onset   Colon cancer Mother 67   Heart attack Mother    Breast cancer Sister 54   Heart disease Maternal Uncle        CABG   Breast cancer Sister 67   Heart disease Brother    Other Father        suicide   Breast cancer Sister 86   Heart disease Sister    COPD Brother    Heart disease Brother    Breast cancer Other 68       niece - brother's daughter   Pancreatic cancer Other        brother's son, dx in his 32s   Social History   Socioeconomic History   Marital status: Divorced    Spouse name: Andy Bannister   Number of children: 1   Years of education: Not on file   Highest education level: Not on file  Occupational History   Occupation: retire  Tobacco Use   Smoking status: Never   Smokeless tobacco: Never  Vaping Use   Vaping  status: Never Used  Substance and Sexual Activity   Alcohol use: No    Alcohol/week: 0.0 standard drinks of alcohol   Drug use: No   Sexual activity: Not Currently  Other Topics Concern   Not on file  Social History Narrative   Lives with a friend-per pt/2025   Social Drivers of Health   Financial Resource Strain: Low Risk  (08/26/2023)   Overall Financial Resource Strain (CARDIA)    Difficulty of Paying Living Expenses: Not hard at all  Food Insecurity: No Food Insecurity (08/26/2023)   Hunger Vital Sign    Worried About Running Out of Food in the Last Year: Never true    Ran Out  of Food in the Last Year: Never true  Transportation Needs: No Transportation Needs (10/06/2022)   PRAPARE - Administrator, Civil Service (Medical): No    Lack of Transportation (Non-Medical): No  Physical Activity: Insufficiently Active (08/26/2023)   Exercise Vital Sign    Days of Exercise per Week: 7 days    Minutes of Exercise per Session: 10 min  Stress: No Stress Concern Present (08/26/2023)   [employer] of Occupational Health - Occupational Stress Questionnaire    Feeling of Stress : Only a little  Social Connections: Moderately Integrated (08/26/2023)   Social Connection and Isolation Panel [NHANES]    Frequency of Communication with Friends and Family: More than three times a week    Frequency of Social Gatherings with Friends and Family: Once a week    Attends Religious Services: More than 4 times per year    Active Member of [employer] or Organizations: Yes    Attends Banker Meetings: Never    Marital Status: Widowed    Tobacco Counseling Counseling given: Not Answered    Clinical Intake:  Pre-visit preparation completed: Yes  Pain : No/denies pain     BMI - recorded: 23.05 Nutritional Status: BMI of 19-24  Normal Nutritional Risks: None Diabetes: Yes CBG done?: No Did pt. bring in CBG monitor from home?: No  Lab Results  Component Value Date    HGBA1C 6.8 (H) 04/12/2023   HGBA1C 6.9 (H) 09/27/2022   HGBA1C 6.7 (H) 11/11/2021     How often do you need to have someone help you when you read instructions, pamphlets, or other written materials from your doctor or pharmacy?: 1 - Never  Interpreter Needed?: No  Information entered by :: Kalika Smay, RMA   Activities of Daily Living     08/26/2023   10:08 AM 10/06/2022   12:10 PM  In your present state of health, do you have any difficulty performing the following activities:  Hearing? 1 0  Comment hearing loss in lt ear-per pt   Vision? 0 0  Difficulty concentrating or making decisions? 0 0  Walking or climbing stairs? 0 0  Dressing or bathing? 0 0  Doing errands, shopping? 0 0  Comment [employer] transportation   Preparing Food and eating ? N N  Using the Toilet? N N  In the past six months, have you accidently leaked urine? N N  Do you have problems with loss of bowel control? N N  Managing your Medications? N N  Managing your Finances? N N  Housekeeping or managing your Housekeeping? 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    Patient Care Team: Colene Dauphin, MD as PCP - General (Internal Medicine) Audery Blazing Deannie Fabian, MD as PCP - Cardiology (Cardiology) Audery Blazing Deannie Fabian, MD as Consulting Physician (Cardiology) Magrinat, Rozella Cornfield, MD (Inactive) as Consulting Physician (Hematology and Oncology) Dolph Friar, MD as Consulting Physician (Obstetrics and Gynecology) Juanita Norlander, MD as Consulting Physician (General Surgery) Prescott Brodie, MD (Inactive) as Consulting Physician (Otolaryngology) Johna Myers, MD as Consulting Physician (Radiation Oncology) Debbie Fails Laura Polio, NP as Nurse Practitioner (Hematology and Oncology) Dema Filler, MD as Consulting Physician (Ophthalmology) Jonathan Neighbor, [facility] (Inactive) as Pharmacist (Pharmacist)  Indicate any recent Medical Services you may have received from other than [facility] providers in the  past year (date may be approximate).     Assessment:    This is a routine wellness examination for Tami Jacobson.  Hearing/Vision screen  Hearing Screening - Comments:: hearing loss in lt ear-per pt Vision Screening - Comments:: Wears eyeglasses/   Goals Addressed   None    Depression Screen     08/26/2023   10:27 AM 10/06/2022   12:22 PM 09/27/2022   11:08 AM 05/28/2022   10:10 AM 02/23/2022    2:33 PM 11/11/2021    1:37 PM 06/09/2021    1:49 PM  PHQ 2/9 Scores  PHQ - 2 Score 0 0 0 0 0 0 0  PHQ- 9 Score 1     0     Fall Risk     08/26/2023   10:23 AM 10/06/2022   12:23 PM 09/27/2022   11:08 AM 05/28/2022   10:09 AM 03/01/2022    2:26 PM  Fall Risk   Falls in the past year? 1 0 0 0 1  Comment  pt dose use a walker     Number falls in past yr: 0 0 0 0 0  Injury with Fall? 1 0 0 0 1  Risk for fall due to : Impaired balance/gait No Fall Risks No Fall Risks No Fall Risks No Fall Risks  Follow up Falls evaluation completed;Falls prevention discussed Falls evaluation completed Falls evaluation completed Falls evaluation completed Falls evaluation completed    MEDICARE RISK AT HOME:  Medicare Risk at Home Any stairs in or around the home?: No If so, are there any without handrails?: No Home free of loose throw rugs in walkways, pet beds, electrical cords, etc?: Yes Adequate lighting in your home to reduce risk of falls?: Yes Life alert?: No Use of a cane, walker or w/c?: Yes (walker and cane at times) Grab bars in the bathroom?: Yes Shower chair or bench in shower?: Yes Elevated toilet seat or a handicapped toilet?: Yes  TIMED UP AND GO:  Was the test performed?  Yes  Length of time to ambulate 10 feet: 20 sec Gait slow and steady with assistive device  Cognitive Function: 6CIT completed    01/30/2020    2:02 PM 09/22/2017   12:03 PM 09/27/2016    8:50 AM  MMSE - Mini Mental State Exam  Not completed:   Refused  Orientation to time 5 5   Orientation to Place 5 5    Registration 3 3   Attention/ Calculation 5 5   Recall 3 3   Language- name 2 objects 2 2   Language- repeat 1 1   Language- follow 3 step command 2 3   Language- read & follow direction 0 1   Write a sentence 1 1   Copy design 0 1   Total score 27 30         08/26/2023   10:24 AM 10/06/2022   12:25 PM  6CIT Screen  What Year? 0 points 0 points  What month? 0 points 0 points  What time? 0 points 3 points  Count back from 20 0 points 0 points  Months in reverse 0 points 0 points  Repeat phrase 4 points 0 points  Total Score 4 points 3 points    Immunizations Immunization History  Administered Date(s) Administered   Fluad Quad(high Dose 65+) 01/04/2019, 01/07/2020, 12/09/2020, 12/18/2021   Fluad Trivalent(High Dose 65+) 12/27/2022   Influenza Split 12/14/2011   Influenza Whole 02/01/2007, 01/03/2009, 12/15/2010   Influenza, High Dose Seasonal PF 01/01/2014, 12/25/2014, 12/15/2015, 12/24/2016, 12/27/2017   Influenza,inj,Quad PF,6+ Mos 12/26/2012   Moderna Sars-Covid-2 Vaccination 06/03/2019, 06/30/2019, 03/07/2020   Pfizer(Comirnaty)Fall Seasonal Vaccine  12 years and older 05/05/2023   Pneumococcal Conjugate-13 07/02/2014   Pneumococcal Polysaccharide-23 02/16/2002, 02/09/2012   Respiratory Syncytial Virus Vaccine ,Recomb Aduvanted(Arexvy ) 05/12/2023   Td 06/10/2009    Screening Tests Health Maintenance  Topic Date Due   Zoster Vaccines- Shingrix (1 of 2) Never done   DTaP/Tdap/Td (2 - Tdap) 06/11/2019   COVID-19 Vaccine (5 - Moderna risk 2024-25 season) 11/02/2023   INFLUENZA VACCINE  11/04/2023   OPHTHALMOLOGY EXAM  01/27/2024   FOOT EXAM  07/24/2024   Medicare Annual Wellness (AWV)  08/25/2024   Pneumonia Vaccine 42+ Years old  Completed   DEXA SCAN  Completed   HPV VACCINES  Aged Out   Meningococcal B Vaccine  Aged Out    Health Maintenance  Health Maintenance Due  Topic Date Due   Zoster Vaccines- Shingrix (1 of 2) Never done   DTaP/Tdap/Td (2 - Tdap)  06/11/2019   Health Maintenance Items Addressed: See Nurse Notes  Additional Screening:  Vision Screening: Recommended annual ophthalmology exams for early detection of glaucoma and other disorders of the eye.  Dental Screening: Recommended annual dental exams for proper oral hygiene  Community Resource Referral / Chronic Care Management: CRR required this visit?  No   CCM required this visit?  No   Plan:    I have personally reviewed and noted the following in the patient's chart:   Medical and social history Use of alcohol, tobacco or illicit drugs  Current medications and supplements including opioid prescriptions. Patient is not currently taking opioid prescriptions. Functional ability and status Nutritional status Physical activity Advanced directives List of other physicians Hospitalizations, surgeries, and ER visits in previous 12 months Vitals Screenings to include cognitive, depression, and falls Referrals and appointments  In addition, I have reviewed and discussed with patient certain preventive protocols, quality metrics, and best practice recommendations. A written personalized care plan for preventive services as well as general preventive health recommendations were provided to patient.   Perian Tedder L Treyshawn Muldrew, CMA   08/26/2023   After Visit Summary: (In Person-Printed) AVS printed and given to the patient  Notes: Please refer to Routing Comments.

## 2023-08-26 NOTE — Patient Instructions (Signed)
 Tami Jacobson , Thank you for taking time out of your busy schedule to complete your Annual Wellness Visit with me. I enjoyed our conversation and look forward to speaking with you again next year. I, as well as your care team,  appreciate your ongoing commitment to your health goals. Please review the following plan we discussed and let me know if I can assist you in the future. Your Game plan/ To Do List     Follow up Visits: Next Medicare AWV with our clinical staff: 08/29/2024.   Have you seen your provider in the last 6 months (3 months if uncontrolled diabetes)? Yes Next Office Visit with your provider: 10/10/2023.  Clinician Recommendations:  Aim for 30 minutes of exercise or brisk walking, 6-8 glasses of water, and 5 servings of fruits and vegetables each day. You are due for a shingles vaccine and a tetanus vaccine.  These can be given at your local pharmacy.      This is a list of the screening recommended for you and due dates:  Health Maintenance  Topic Date Due   Zoster (Shingles) Vaccine (1 of 2) Never done   DTaP/Tdap/Td vaccine (2 - Tdap) 06/11/2019   Medicare Annual Wellness Visit  10/06/2023   COVID-19 Vaccine (5 - Moderna risk 2024-25 season) 11/02/2023   Flu Shot  11/04/2023   Eye exam for diabetics  01/27/2024   Complete foot exam   07/24/2024   Pneumonia Vaccine  Completed   DEXA scan (bone density measurement)  Completed   HPV Vaccine  Aged Out   Meningitis B Vaccine  Aged Out    Advanced directives: (In Chart) A copy of your advanced directives are scanned into your chart should your provider ever need it. Advance Care Planning is important because it:  [x]  Makes sure you receive the medical care that is consistent with your values, goals, and preferences  [x]  It provides guidance to your family and loved ones and reduces their decisional burden about whether or not they are making the right decisions based on your wishes.  Follow the link provided in your after  visit summary or read over the paperwork we have mailed to you to help you started getting your Advance Directives in place. If you need assistance in completing these, please reach out to us  so that we can help you!  See attachments for Preventive Care and Fall Prevention Tips.

## 2023-09-08 ENCOUNTER — Other Ambulatory Visit: Payer: Self-pay | Admitting: Internal Medicine

## 2023-09-08 DIAGNOSIS — Z1231 Encounter for screening mammogram for malignant neoplasm of breast: Secondary | ICD-10-CM

## 2023-10-07 ENCOUNTER — Other Ambulatory Visit: Payer: Self-pay | Admitting: Internal Medicine

## 2023-10-10 ENCOUNTER — Encounter: Payer: Medicare Other | Admitting: Internal Medicine

## 2023-10-24 ENCOUNTER — Encounter: Payer: Self-pay | Admitting: Podiatry

## 2023-10-24 ENCOUNTER — Telehealth: Payer: Self-pay

## 2023-10-24 ENCOUNTER — Ambulatory Visit (INDEPENDENT_AMBULATORY_CARE_PROVIDER_SITE_OTHER): Admitting: Podiatry

## 2023-10-24 DIAGNOSIS — M79675 Pain in left toe(s): Secondary | ICD-10-CM

## 2023-10-24 DIAGNOSIS — E118 Type 2 diabetes mellitus with unspecified complications: Secondary | ICD-10-CM | POA: Diagnosis not present

## 2023-10-24 DIAGNOSIS — M79674 Pain in right toe(s): Secondary | ICD-10-CM

## 2023-10-24 DIAGNOSIS — B351 Tinea unguium: Secondary | ICD-10-CM

## 2023-10-24 NOTE — Progress Notes (Signed)
 This patient returns to my office for at risk foot care.  This patient requires this care by a professional since this patient will be at risk due to having diabetes and thrombocytopenia. This patient is unable to cut nails herself since the patient cannot reach her nails.These nails are painful walking and wearing shoes.  This patient presents for at risk foot care today.  General Appearance  Alert, conversant and in no acute stress.  Vascular  Dorsalis pedis and posterior tibial  pulses are  weakly palpable  bilaterally.  Capillary return is within normal limits  bilaterally. Cold feet. bilaterally.  Neurologic  Senn-Weinstein monofilament wire test within normal limits  bilaterally. Muscle power within normal limits bilaterally.  Nails Thick disfigured discolored nails with subungual debris  from hallux to fifth toes bilaterally. No evidence of bacterial infection or drainage bilaterally.  Orthopedic  No limitations of motion  feet .  No crepitus or effusions noted.  No bony pathology or digital deformities noted.  Skin  normotropic skin with no porokeratosis noted bilaterally.  No signs of infections or ulcers noted.     Onychomycosis  Pain in right toes  Pain in left toes  Consent was obtained for treatment procedures.   Mechanical debridement of nails 1-5  bilaterally performed with a nail nipper.  Filed with dremel without incident.    Return office visit    3 months                  Told patient to return for periodic foot care and evaluation due to potential at risk complications.   Helane Gunther DPM

## 2023-10-24 NOTE — Telephone Encounter (Signed)
 Copied from CRM [id number]. Topic: Appointments - Appointment Info/Confirmation >> Oct 24, 2023  9:26 AM Donna BRAVO wrote: Patient/patient representative is calling for information regarding an appointment.  Patient returning missed call from Dr Geofm office.  Patient does not know what it is about. Patient will call back this afternoon, she does not want to miss her appt on 10/31/23 >> Oct 24, 2023  9:37 AM Mesmerise C wrote: Patient stated she found out the call was to confirm her appointment next Monday has no further questions and doesn't need call back

## 2023-10-25 NOTE — Telephone Encounter (Signed)
 Message left for patient.  There is no current documentation in her chart to indicate who called her.

## 2023-10-31 ENCOUNTER — Encounter: Payer: Self-pay | Admitting: Internal Medicine

## 2023-10-31 ENCOUNTER — Ambulatory Visit (INDEPENDENT_AMBULATORY_CARE_PROVIDER_SITE_OTHER): Admitting: Internal Medicine

## 2023-10-31 VITALS — BP 110/68 | HR 62 | Temp 98.0°F | Ht 62.0 in | Wt 124.0 lb

## 2023-10-31 DIAGNOSIS — E785 Hyperlipidemia, unspecified: Secondary | ICD-10-CM

## 2023-10-31 DIAGNOSIS — K219 Gastro-esophageal reflux disease without esophagitis: Secondary | ICD-10-CM | POA: Diagnosis not present

## 2023-10-31 DIAGNOSIS — D696 Thrombocytopenia, unspecified: Secondary | ICD-10-CM | POA: Diagnosis not present

## 2023-10-31 DIAGNOSIS — E538 Deficiency of other specified B group vitamins: Secondary | ICD-10-CM

## 2023-10-31 DIAGNOSIS — I10 Essential (primary) hypertension: Secondary | ICD-10-CM | POA: Diagnosis not present

## 2023-10-31 DIAGNOSIS — D649 Anemia, unspecified: Secondary | ICD-10-CM

## 2023-10-31 DIAGNOSIS — I482 Chronic atrial fibrillation, unspecified: Secondary | ICD-10-CM | POA: Diagnosis not present

## 2023-10-31 DIAGNOSIS — I5032 Chronic diastolic (congestive) heart failure: Secondary | ICD-10-CM | POA: Diagnosis not present

## 2023-10-31 DIAGNOSIS — Z0000 Encounter for general adult medical examination without abnormal findings: Secondary | ICD-10-CM | POA: Diagnosis not present

## 2023-10-31 DIAGNOSIS — E118 Type 2 diabetes mellitus with unspecified complications: Secondary | ICD-10-CM | POA: Diagnosis not present

## 2023-10-31 LAB — CBC WITH DIFFERENTIAL/PLATELET
Basophils Absolute: 0 K/uL (ref 0.0–0.1)
Basophils Relative: 0.6 % (ref 0.0–3.0)
Eosinophils Absolute: 0.2 K/uL (ref 0.0–0.7)
Eosinophils Relative: 3.4 % (ref 0.0–5.0)
HCT: 30.8 % — ABNORMAL LOW (ref 36.0–46.0)
Hemoglobin: 10.6 g/dL — ABNORMAL LOW (ref 12.0–15.0)
Lymphocytes Relative: 18.7 % (ref 12.0–46.0)
Lymphs Abs: 0.9 K/uL (ref 0.7–4.0)
MCHC: 34.5 g/dL (ref 30.0–36.0)
MCV: 94 fl (ref 78.0–100.0)
Monocytes Absolute: 0.6 K/uL (ref 0.1–1.0)
Monocytes Relative: 11.8 % (ref 3.0–12.0)
Neutro Abs: 3.1 K/uL (ref 1.4–7.7)
Neutrophils Relative %: 65.5 % (ref 43.0–77.0)
Platelets: 171 K/uL (ref 150.0–400.0)
RBC: 3.28 Mil/uL — ABNORMAL LOW (ref 3.87–5.11)
RDW: 13.8 % (ref 11.5–15.5)
WBC: 4.7 K/uL (ref 4.0–10.5)

## 2023-10-31 LAB — COMPREHENSIVE METABOLIC PANEL WITH GFR
ALT: 12 U/L (ref 0–35)
AST: 16 U/L (ref 0–37)
Albumin: 4.3 g/dL (ref 3.5–5.2)
Alkaline Phosphatase: 64 U/L (ref 39–117)
BUN: 13 mg/dL (ref 6–23)
CO2: 29 meq/L (ref 19–32)
Calcium: 9.9 mg/dL (ref 8.4–10.5)
Chloride: 100 meq/L (ref 96–112)
Creatinine, Ser: 1.04 mg/dL (ref 0.40–1.20)
GFR: 46.16 mL/min — ABNORMAL LOW (ref >60.00)
Glucose, Bld: 125 mg/dL — ABNORMAL HIGH (ref 70–99)
Potassium: 4.5 meq/L (ref 3.5–5.1)
Sodium: 136 meq/L (ref 135–145)
Total Bilirubin: 0.6 mg/dL (ref 0.2–1.2)
Total Protein: 7.1 g/dL (ref 6.0–8.3)

## 2023-10-31 LAB — HEMOGLOBIN A1C: Hgb A1c MFr Bld: 6.5 % (ref 4.6–6.5)

## 2023-10-31 LAB — LIPID PANEL
Cholesterol: 178 mg/dL (ref 0–200)
HDL: 40.7 mg/dL (ref >39.00)
LDL Cholesterol: 93 mg/dL (ref 0–99)
NonHDL: 136.91
Total CHOL/HDL Ratio: 4
Triglycerides: 222 mg/dL — ABNORMAL HIGH (ref 0.0–149.0)
VLDL: 44.4 mg/dL — ABNORMAL HIGH (ref 0.0–40.0)

## 2023-10-31 LAB — IBC PANEL
Iron: 101 ug/dL (ref 42–145)
Saturation Ratios: 32.8 % (ref 20.0–50.0)
TIBC: 308 ug/dL (ref 250.0–450.0)
Transferrin: 220 mg/dL (ref 212.0–360.0)

## 2023-10-31 NOTE — Assessment & Plan Note (Signed)
 Chronic Taking B12 orally Check B12 level B12 injection today - will give injections when she is here - has difficulty getting here regularly

## 2023-10-31 NOTE — Patient Instructions (Addendum)
 Blood work was ordered.       Medications changes include :   None     Return in about 6 months (around 05/02/2024) for follow up.    Health Maintenance, Female Adopting a healthy lifestyle and getting preventive care are important in promoting health and wellness. Ask your health care provider about: The right schedule for you to have regular tests and exams. Things you can do on your own to prevent diseases and keep yourself healthy. What should I know about diet, weight, and exercise? Eat a healthy diet  Eat a diet that includes plenty of vegetables, fruits, low-fat dairy products, and lean protein. Do not eat a lot of foods that are high in solid fats, added sugars, or sodium. Maintain a healthy weight Body mass index (BMI) is used to identify weight problems. It estimates body fat based on height and weight. Your health care provider can help determine your BMI and help you achieve or maintain a healthy weight. Get regular exercise Get regular exercise. This is one of the most important things you can do for your health. Most adults should: Exercise for at least 150 minutes each week. The exercise should increase your heart rate and make you sweat (moderate-intensity exercise). Do strengthening exercises at least twice a week. This is in addition to the moderate-intensity exercise. Spend less time sitting. Even light physical activity can be beneficial. Watch cholesterol and blood lipids Have your blood tested for lipids and cholesterol at 88 years of age, then have this test every 5 years. Have your cholesterol levels checked more often if: Your lipid or cholesterol levels are high. You are older than 88 years of age. You are at high risk for heart disease. What should I know about cancer screening? Depending on your health history and family history, you may need to have cancer screening at various ages. This may include screening for: Breast cancer. Cervical  cancer. Colorectal cancer. Skin cancer. Lung cancer. What should I know about heart disease, diabetes, and high blood pressure? Blood pressure and heart disease High blood pressure causes heart disease and increases the risk of stroke. This is more likely to develop in people who have high blood pressure readings or are overweight. Have your blood pressure checked: Every 3-5 years if you are 28-59 years of age. Every year if you are 29 years old or older. Diabetes Have regular diabetes screenings. This checks your fasting blood sugar level. Have the screening done: Once every three years after age 22 if you are at a normal weight and have a low risk for diabetes. More often and at a younger age if you are overweight or have a high risk for diabetes. What should I know about preventing infection? Hepatitis B If you have a higher risk for hepatitis B, you should be screened for this virus. Talk with your health care provider to find out if you are at risk for hepatitis B infection. Hepatitis C Testing is recommended for: Everyone born from [birth year] through 1965. Anyone with known risk factors for hepatitis C. Sexually transmitted infections (STIs) Get screened for STIs, including gonorrhea and chlamydia, if: You are sexually active and are younger than 88 years of age. You are older than 88 years of age and your health care provider tells you that you are at risk for this type of infection. Your sexual activity has changed since you were last screened, and you are at increased risk for chlamydia or  gonorrhea. Ask your health care provider if you are at risk. Ask your health care provider about whether you are at high risk for HIV. Your health care provider may recommend a prescription medicine to help prevent HIV infection. If you choose to take medicine to prevent HIV, you should first get tested for HIV. You should then be tested every 3 months for as long as you are taking the  medicine. Pregnancy If you are about to stop having your period (premenopausal) and you may become pregnant, seek counseling before you get pregnant. Take 400 to 800 micrograms (mcg) of folic acid  every day if you become pregnant. Ask for birth control (contraception) if you want to prevent pregnancy. Osteoporosis and menopause Osteoporosis is a disease in which the bones lose minerals and strength with aging. This can result in bone fractures. If you are 85 years old or older, or if you are at risk for osteoporosis and fractures, ask your health care provider if you should: Be screened for bone loss. Take a calcium or vitamin D  supplement to lower your risk of fractures. Be given hormone replacement therapy (HRT) to treat symptoms of menopause. Follow these instructions at home: Alcohol use Do not drink alcohol if: Your health care provider tells you not to drink. You are pregnant, may be pregnant, or are planning to become pregnant. If you drink alcohol: Limit how much you have to: 0-1 drink a day. Know how much alcohol is in your drink. In the [place] one drink equals one 12 oz bottle of beer (355 mL), one 5 oz glass of wine (148 mL), or one 1 oz glass of hard liquor (44 mL). Lifestyle Do not use any products that contain nicotine or tobacco. These products include cigarettes, chewing tobacco, and vaping devices, such as e-cigarettes. If you need help quitting, ask your health care provider. Do not use street drugs. Do not share needles. Ask your health care provider for help if you need support or information about quitting drugs. General instructions Schedule regular health, dental, and eye exams. Stay current with your vaccines. Tell your health care provider if: You often feel depressed. You have ever been abused or do not feel safe at home. Summary Adopting a healthy lifestyle and getting preventive care are important in promoting health and wellness. Follow your health care  provider's instructions about healthy diet, exercising, and getting tested or screened for diseases. Follow your health care provider's instructions on monitoring your cholesterol and blood pressure. This information is not intended to replace advice given to you by your health care provider. Make sure you discuss any questions you have with your health care provider. Document Revised: 08/11/2020 Document Reviewed: 08/11/2020 Elsevier Patient Education  2024 [employer].

## 2023-10-31 NOTE — Assessment & Plan Note (Signed)
 Chronic Check lipid panel, CMP Controlled with lifestyle - intolerant of statins and PCSK9

## 2023-10-31 NOTE — Assessment & Plan Note (Signed)
Chronic ?Check CBC, iron panel ?

## 2023-10-31 NOTE — Progress Notes (Signed)
 Subjective:    Patient ID: Tami Jacobson, female    DOB: 03/02/1928, 89 y.o.   MRN: [id number]      HPI Tami Jacobson is here for a Physical exam and her chronic medical problems.    Dry mouth, dry skin.  Drinks ice tea and some water.    Medications and allergies reviewed with patient and updated if appropriate.  Current Outpatient Medications on File Prior to Visit  Medication Sig Dispense Refill   acetaminophen  (TYLENOL  8 HOUR) 650 MG CR tablet Take 2 tablets every 8 hours by oral route.     amLODipine  (NORVASC ) 2.5 MG tablet TAKE 1 TABLET BY MOUTH DAILY FOR BLOOD PRESSURE 100 tablet 2   Blood Glucose Monitoring Suppl DEVI UAD to check sugars.  E11.9.  May substitute to any manufacturer covered by patient's insurance. 1 each 0   clobetasol cream (TEMOVATE) 0.05 % APPLY CREAM TOPICALLY TWICE DAILY AS NEEDED FOR IRRITATION FROM LICHEN SCLEROSUS     cyanocobalamin  (,VITAMIN B-12,) 1000 MCG/ML injection Inject 1,000 mcg into the muscle every 30 (thirty) days.     flecainide  (TAMBOCOR ) 50 MG tablet TAKE 1 TABLET BY MOUTH TWICE  DAILY 200 tablet 2   Glucose Blood (BLOOD GLUCOSE TEST STRIPS) STRP UAD to check sugars daily and prn.  E11.9.  May substitute to any manufacturer covered by patient's insurance. 100 strip 3   Lancet Device MISC UAD for help check sugars.  E11.9.  May substitute to any manufacturer covered by patient's insurance. 1 each 0   Lancets Misc. MISC UAD to check sugars daily and prn.  E11.9.  May substitute to any manufacturer covered by patient's insurance. 100 each 3   losartan  (COZAAR ) 100 MG tablet TAKE 1 TABLET BY MOUTH DAILY FOR BLOOD PRESSURE 100 tablet 2   metFORMIN  (GLUCOPHAGE -XR) 500 MG 24 hr tablet TAKE 1 TABLET BY MOUTH DAILY  WITH BREAKFAST FOR YOUR SUGAR 100 tablet 2   metoprolol  succinate (TOPROL -XL) 50 MG 24 hr tablet TAKE 1 TABLET BY MOUTH DAILY 100 tablet 2   omeprazole  (PRILOSEC) 40 MG capsule TAKE 1 CAPSULE BY MOUTH DAILY 100 capsule 2   No  current facility-administered medications on file prior to visit.    Review of Systems  Constitutional:  Negative for fever.  Eyes:  Negative for visual disturbance.  Respiratory:  Positive for cough (residual from recent URI). Negative for shortness of breath and wheezing.   Cardiovascular:  Negative for chest pain, palpitations and leg swelling.  Gastrointestinal:  Negative for abdominal pain, blood in stool, constipation and diarrhea.       GERD controlled  Genitourinary:  Negative for dysuria.  Musculoskeletal:  Negative for arthralgias and back pain.  Skin:  Negative for rash.       Dry skin, areas of hyperpigmentation, thinning hair  Neurological:  Positive for headaches (seldom). Negative for light-headedness.  Psychiatric/Behavioral:  Negative for dysphoric mood. The patient is not nervous/anxious.        Objective:   Vitals:   10/31/23 1304  BP: 110/68  Pulse: 62  Temp: 98 F (36.7 C)  SpO2: 96%   Filed Weights   10/31/23 1304  Weight: 124 lb (56.2 kg)   Body mass index is 22.68 kg/m.  BP Readings from Last 3 Encounters:  10/31/23 110/68  08/26/23 130/60  08/23/23 (!) 122/50    Wt Readings from Last 3 Encounters:  10/31/23 124 lb (56.2 kg)  08/26/23 126 lb (57.2 kg)  08/23/23 124 lb 3.2  oz (56.3 kg)       Physical Exam Constitutional: She appears well-developed and well-nourished. No distress.  HENT:  Head: Normocephalic and atraumatic.  Right Ear: External ear normal. Normal ear canal and TM Left Ear: External ear normal.  Normal ear canal and TM Mouth/Throat: Oropharynx is clear and moist. Poor dentition. Eyes: Conjunctivae normal.  Neck: Neck supple. No tracheal deviation present. No thyromegaly present.  No carotid bruit  Cardiovascular: Normal rate, regular rhythm and normal heart sounds.   2/6 sys murmur heard.  No edema. Pulmonary/Chest: Effort normal and breath sounds normal. No respiratory distress. She has no wheezes. She has no rales.   Breast: deferred   Abdominal: Soft. She exhibits no distension. There is no tenderness.  Lymphadenopathy: She has no cervical adenopathy.  Skin: Skin is warm and dry. She is not diaphoretic.  Psychiatric: She has a normal mood and affect. Her behavior is normal.     Lab Results  Component Value Date   WBC 5.0 04/12/2023   HGB 11.0 (L) 04/12/2023   HCT 31.6 (L) 04/12/2023   PLT 170.0 04/12/2023   GLUCOSE 155 (H) 04/12/2023   CHOL 173 02/22/2019   TRIG 157.0 (H) 02/22/2019   HDL 50.40 02/22/2019   LDLDIRECT 128.3 12/26/2012   LDLCALC 92 02/22/2019   ALT 11 04/12/2023   AST 16 04/12/2023   NA 137 04/12/2023   K 4.7 04/12/2023   CL 101 04/12/2023   CREATININE 0.95 04/12/2023   BUN 16 04/12/2023   CO2 25 04/12/2023   TSH 2.519 05/15/2019   HGBA1C 6.8 (H) 04/12/2023         Assessment & Plan:   Physical exam: Screening blood work  ordered Exercise  sedentary Weight  is normal Substance abuse  none   Reviewed recommended immunizations.   Health Maintenance  Topic Date Due   Zoster Vaccines- Shingrix (1 of 2) Never done   DTaP/Tdap/Td (2 - Tdap) 06/11/2019   COVID-19 Vaccine (5 - Moderna risk 2024-25 season) 11/02/2023   INFLUENZA VACCINE  11/04/2023   OPHTHALMOLOGY EXAM  01/27/2024   FOOT EXAM  07/24/2024   Medicare Annual Wellness (AWV)  08/25/2024   Pneumococcal Vaccine: 50+ Years  Completed   DEXA SCAN  Completed   Hepatitis B Vaccines  Aged Out   HPV VACCINES  Aged Out   Meningococcal B Vaccine  Aged Out          See Problem List for Assessment and Plan of chronic medical problems.

## 2023-10-31 NOTE — Assessment & Plan Note (Signed)
 Chronic Follows with cardiology-Dr. Pietro On flecainide , metoprolol  XL 50 mg daily Cbc, cmp, TSH

## 2023-10-31 NOTE — Assessment & Plan Note (Signed)
Chronic Mild cbc 

## 2023-10-31 NOTE — Assessment & Plan Note (Signed)
Chronic Euvolemic Continue metoprolol XL 25 mg daily

## 2023-10-31 NOTE — Assessment & Plan Note (Signed)
 Chronic BP well-controlled Cbc, cmp Continue amodipine 2.5 mg daily, losartan 100 mg daily, metoprolol xl 50 mg daily

## 2023-10-31 NOTE — Assessment & Plan Note (Signed)
 Chronic GERD controlled Continue omeprazole 40 mg daily

## 2023-10-31 NOTE — Assessment & Plan Note (Signed)
 Chronic  Lab Results  Component Value Date   HGBA1C 6.8 (H) 04/12/2023   Sugars controlled Continue metformin  XR 500 mg daily Encouraged regular exercise, diabetic diet

## 2023-11-01 LAB — VITAMIN B12: Vitamin B-12: 1282 pg/mL — ABNORMAL HIGH (ref 211–911)

## 2023-11-01 LAB — FERRITIN: Ferritin: 87 ng/mL (ref 10.0–291.0)

## 2023-11-01 LAB — TSH: TSH: 1.69 u[IU]/mL (ref 0.35–5.50)

## 2023-11-02 ENCOUNTER — Ambulatory Visit: Payer: Self-pay | Admitting: Internal Medicine

## 2023-11-03 ENCOUNTER — Ambulatory Visit
Admission: RE | Admit: 2023-11-03 | Discharge: 2023-11-03 | Disposition: A | Discharge status: Home / Self Care | Source: Ambulatory Visit | Attending: Internal Medicine | Admitting: Internal Medicine

## 2023-11-03 DIAGNOSIS — Z1231 Encounter for screening mammogram for malignant neoplasm of breast: Secondary | ICD-10-CM

## 2023-11-10 ENCOUNTER — Encounter: Payer: Self-pay | Admitting: Internal Medicine

## 2023-11-11 ENCOUNTER — Other Ambulatory Visit: Payer: Self-pay | Admitting: Cardiology

## 2023-11-11 DIAGNOSIS — R002 Palpitations: Secondary | ICD-10-CM

## 2023-11-14 ENCOUNTER — Telehealth: Payer: Self-pay | Admitting: Internal Medicine

## 2023-11-14 NOTE — Telephone Encounter (Signed)
 Copied from CRM #[id number]. Topic: Clinical - Medication Refill >> Nov 14, 2023  9:34 AM Shereese L wrote: Medication: metoprolol  succinate (TOPROL -XL) 50 MG 24 hr tablet  Has the patient contacted their pharmacy? Yes (Agent: If no, request that the patient contact the pharmacy for the refill. If patient does not wish to contact the pharmacy document the reason why and proceed with request.) (Agent: If yes, when and what did the pharmacy advise?)  This is the patient's preferred pharmacy:  [facility] - [place], [place] - [address] [facility] [address] [place] [address] [place]  [address] Phone: [phone] Fax: [phone]   Is this the correct pharmacy for this prescription? Yes If no, delete pharmacy and type the correct one.   Has the prescription been filled recently? Yes  Is the patient out of the medication? Yes  Has the patient been seen for an appointment in the last year OR does the patient have an upcoming appointment? Yes  Can we respond through MyChart? Yes  Agent: Please be advised that Rx refills may take up to 3 business days. We ask that you follow-up with your pharmacy.

## 2023-11-14 NOTE — Telephone Encounter (Signed)
 Copied from CRM [id number]. Topic: Clinical - Lab/Test Results >> Nov 14, 2023  9:37 AM Charolett L wrote: Reason for CRM: patient would like a call back to go over her results for her mammogram

## 2023-11-15 NOTE — Telephone Encounter (Signed)
Spoke with patient today and results given. 

## 2023-11-15 NOTE — Telephone Encounter (Signed)
 Refill already addressed by Dr. Pietro.

## 2023-11-15 NOTE — Telephone Encounter (Signed)
 Patient informed.

## 2023-11-25 ENCOUNTER — Other Ambulatory Visit: Payer: Self-pay | Admitting: Cardiology

## 2023-11-28 NOTE — Telephone Encounter (Unsigned)
 Copied from CRM #[id number]. Topic: General - Other >> Nov 28, 2023 11:05 AM Mercedes MATSU wrote: Reason for CRM: Patient calling in requesting to speak with Tobias and did not disclose the nature of the call. Patient can be reached at [phone].

## 2023-11-30 ENCOUNTER — Observation Stay (HOSPITAL_COMMUNITY)

## 2023-11-30 ENCOUNTER — Emergency Department (HOSPITAL_COMMUNITY)

## 2023-11-30 ENCOUNTER — Encounter (HOSPITAL_COMMUNITY): Payer: Self-pay

## 2023-11-30 ENCOUNTER — Other Ambulatory Visit: Payer: Self-pay

## 2023-11-30 ENCOUNTER — Observation Stay (HOSPITAL_COMMUNITY)
Admission: EM | Admit: 2023-11-30 | Discharge: 2023-12-02 | Disposition: A | Discharge status: Home / Self Care | Attending: Internal Medicine | Admitting: Internal Medicine

## 2023-11-30 DIAGNOSIS — R197 Diarrhea, unspecified: Secondary | ICD-10-CM | POA: Insufficient documentation

## 2023-11-30 DIAGNOSIS — Z79899 Other long term (current) drug therapy: Secondary | ICD-10-CM | POA: Insufficient documentation

## 2023-11-30 DIAGNOSIS — I4891 Unspecified atrial fibrillation: Secondary | ICD-10-CM | POA: Insufficient documentation

## 2023-11-30 DIAGNOSIS — R1310 Dysphagia, unspecified: Secondary | ICD-10-CM | POA: Diagnosis present

## 2023-11-30 DIAGNOSIS — R001 Bradycardia, unspecified: Secondary | ICD-10-CM | POA: Diagnosis not present

## 2023-11-30 DIAGNOSIS — M1990 Unspecified osteoarthritis, unspecified site: Secondary | ICD-10-CM | POA: Diagnosis not present

## 2023-11-30 DIAGNOSIS — I440 Atrioventricular block, first degree: Secondary | ICD-10-CM | POA: Diagnosis not present

## 2023-11-30 DIAGNOSIS — R9431 Abnormal electrocardiogram [ECG] [EKG]: Secondary | ICD-10-CM | POA: Diagnosis present

## 2023-11-30 DIAGNOSIS — M542 Cervicalgia: Secondary | ICD-10-CM | POA: Diagnosis not present

## 2023-11-30 DIAGNOSIS — D61818 Other pancytopenia: Secondary | ICD-10-CM | POA: Diagnosis not present

## 2023-11-30 DIAGNOSIS — R070 Pain in throat: Secondary | ICD-10-CM | POA: Diagnosis not present

## 2023-11-30 DIAGNOSIS — R531 Weakness: Secondary | ICD-10-CM | POA: Insufficient documentation

## 2023-11-30 DIAGNOSIS — I10 Essential (primary) hypertension: Secondary | ICD-10-CM | POA: Insufficient documentation

## 2023-11-30 DIAGNOSIS — E119 Type 2 diabetes mellitus without complications: Secondary | ICD-10-CM | POA: Insufficient documentation

## 2023-11-30 DIAGNOSIS — Z743 Need for continuous supervision: Secondary | ICD-10-CM | POA: Diagnosis not present

## 2023-11-30 DIAGNOSIS — Z7984 Long term (current) use of oral hypoglycemic drugs: Secondary | ICD-10-CM | POA: Insufficient documentation

## 2023-11-30 DIAGNOSIS — I700 Atherosclerosis of aorta: Secondary | ICD-10-CM | POA: Diagnosis not present

## 2023-11-30 DIAGNOSIS — I499 Cardiac arrhythmia, unspecified: Secondary | ICD-10-CM | POA: Diagnosis not present

## 2023-11-30 DIAGNOSIS — R0602 Shortness of breath: Secondary | ICD-10-CM | POA: Diagnosis not present

## 2023-11-30 DIAGNOSIS — R002 Palpitations: Secondary | ICD-10-CM | POA: Diagnosis present

## 2023-11-30 DIAGNOSIS — R000 Tachycardia, unspecified: Secondary | ICD-10-CM | POA: Diagnosis not present

## 2023-11-30 DIAGNOSIS — E876 Hypokalemia: Secondary | ICD-10-CM | POA: Diagnosis not present

## 2023-11-30 DIAGNOSIS — I4430 Unspecified atrioventricular block: Secondary | ICD-10-CM | POA: Diagnosis present

## 2023-11-30 LAB — CBC WITH DIFFERENTIAL/PLATELET
Abs Immature Granulocytes: 0.01 K/uL (ref 0.00–0.07)
Basophils Absolute: 0 K/uL (ref 0.0–0.1)
Basophils Relative: 0 %
Eosinophils Absolute: 0.1 K/uL (ref 0.0–0.5)
Eosinophils Relative: 3 %
HCT: 29.5 % — ABNORMAL LOW (ref 36.0–46.0)
Hemoglobin: 9.8 g/dL — ABNORMAL LOW (ref 12.0–15.0)
Immature Granulocytes: 0 %
Lymphocytes Relative: 15 %
Lymphs Abs: 0.6 K/uL — ABNORMAL LOW (ref 0.7–4.0)
MCH: 32.5 pg (ref 26.0–34.0)
MCHC: 33.2 g/dL (ref 30.0–36.0)
MCV: 97.7 fL (ref 80.0–100.0)
Monocytes Absolute: 0.4 K/uL (ref 0.1–1.0)
Monocytes Relative: 11 %
Neutro Abs: 2.8 K/uL (ref 1.7–7.7)
Neutrophils Relative %: 71 %
Platelets: 126 K/uL — ABNORMAL LOW (ref 150–400)
RBC: 3.02 MIL/uL — ABNORMAL LOW (ref 3.87–5.11)
RDW: 13.8 % (ref 11.5–15.5)
WBC: 3.9 K/uL — ABNORMAL LOW (ref 4.0–10.5)
nRBC: 0 % (ref 0.0–0.2)

## 2023-11-30 LAB — TROPONIN I (HIGH SENSITIVITY)
Troponin I (High Sensitivity): 22 ng/L — ABNORMAL HIGH (ref <18)
Troponin I (High Sensitivity): 66 ng/L — ABNORMAL HIGH (ref <18)
Troponin I (High Sensitivity): 98 ng/L — ABNORMAL HIGH (ref <18)

## 2023-11-30 LAB — RESP PANEL BY RT-PCR (RSV, FLU A&B, COVID)  RVPGX2
Influenza A by PCR: NEGATIVE
Influenza B by PCR: NEGATIVE
Resp Syncytial Virus by PCR: NEGATIVE
SARS Coronavirus 2 by RT PCR: NEGATIVE

## 2023-11-30 LAB — COMPREHENSIVE METABOLIC PANEL WITH GFR
ALT: 8 U/L (ref 0–44)
AST: 15 U/L (ref 15–41)
Albumin: 2.3 g/dL — ABNORMAL LOW (ref 3.5–5.0)
Alkaline Phosphatase: 43 U/L (ref 38–126)
Anion gap: 7 (ref 5–15)
BUN: 7 mg/dL — ABNORMAL LOW (ref 8–23)
CO2: 15 mmol/L — ABNORMAL LOW (ref 22–32)
Calcium: 6.5 mg/dL — ABNORMAL LOW (ref 8.9–10.3)
Chloride: 118 mmol/L — ABNORMAL HIGH (ref 98–111)
Creatinine, Ser: 0.62 mg/dL (ref 0.44–1.00)
GFR, Estimated: >60 mL/min (ref >60)
Glucose, Bld: 118 mg/dL — ABNORMAL HIGH (ref 70–99)
Potassium: 2.6 mmol/L — CL (ref 3.5–5.1)
Sodium: 140 mmol/L (ref 135–145)
Total Bilirubin: 0.7 mg/dL (ref 0.0–1.2)
Total Protein: 4.4 g/dL — ABNORMAL LOW (ref 6.5–8.1)

## 2023-11-30 LAB — GROUP A STREP BY PCR: Group A Strep by PCR: NOT DETECTED

## 2023-11-30 LAB — CBG MONITORING, ED: Glucose-Capillary: 178 mg/dL — ABNORMAL HIGH (ref 70–99)

## 2023-11-30 LAB — BASIC METABOLIC PANEL WITH GFR
Anion gap: 8 (ref 5–15)
BUN: 9 mg/dL (ref 8–23)
CO2: 23 mmol/L (ref 22–32)
Calcium: 9.9 mg/dL (ref 8.9–10.3)
Chloride: 106 mmol/L (ref 98–111)
Creatinine, Ser: 0.86 mg/dL (ref 0.44–1.00)
GFR, Estimated: >60 mL/min (ref >60)
Glucose, Bld: 165 mg/dL — ABNORMAL HIGH (ref 70–99)
Potassium: 5.1 mmol/L (ref 3.5–5.1)
Sodium: 137 mmol/L (ref 135–145)

## 2023-11-30 LAB — GLUCOSE, CAPILLARY
Glucose-Capillary: 138 mg/dL — ABNORMAL HIGH (ref 70–99)
Glucose-Capillary: 141 mg/dL — ABNORMAL HIGH (ref 70–99)

## 2023-11-30 LAB — PROTIME-INR
INR: 1 (ref 0.8–1.2)
Prothrombin Time: 13.8 s (ref 11.4–15.2)

## 2023-11-30 LAB — MAGNESIUM: Magnesium: 1.7 mg/dL (ref 1.7–2.4)

## 2023-11-30 MED ORDER — ENOXAPARIN SODIUM 40 MG/0.4ML IJ SOSY
40.0000 mg | PREFILLED_SYRINGE | INTRAMUSCULAR | Status: DC
  Administered 2023-11-30 – 2023-12-02 (×3): 40 mg via SUBCUTANEOUS
  Filled 2023-11-30 (×3): qty 0.4

## 2023-11-30 MED ORDER — ACETAMINOPHEN 650 MG RE SUPP: 650.0000 mg | Freq: Four times a day (QID) | RECTAL | Status: DC | PRN

## 2023-11-30 MED ORDER — ALBUTEROL SULFATE (2.5 MG/3ML) 0.083% IN NEBU: 2.5000 mg | INHALATION_SOLUTION | Freq: Four times a day (QID) | RESPIRATORY_TRACT | Status: DC | PRN

## 2023-11-30 MED ORDER — CALCIUM GLUCONATE-NACL 2-0.675 GM/100ML-% IV SOLN
2.0000 g | Freq: Once | INTRAVENOUS | Status: DC
  Filled 2023-11-30: qty 100

## 2023-11-30 MED ORDER — ACETAMINOPHEN 325 MG PO TABS: 650.0000 mg | ORAL_TABLET | Freq: Four times a day (QID) | ORAL | Status: DC | PRN

## 2023-11-30 MED ORDER — POTASSIUM CHLORIDE 10 MEQ/100ML IV SOLN
10.0000 meq | INTRAVENOUS | Status: AC
  Administered 2023-11-30 (×3): 10 meq via INTRAVENOUS
  Filled 2023-11-30 (×3): qty 100

## 2023-11-30 MED ORDER — BISACODYL 5 MG PO TBEC: 5.0000 mg | DELAYED_RELEASE_TABLET | Freq: Every day | ORAL | Status: DC | PRN

## 2023-11-30 MED ORDER — LIDOCAINE VISCOUS HCL 2 % MT SOLN
15.0000 mL | Freq: Once | OROMUCOSAL | Status: AC
  Administered 2023-11-30: 15 mL via OROMUCOSAL
  Filled 2023-11-30: qty 15

## 2023-11-30 MED ORDER — POLYETHYLENE GLYCOL 3350 17 G PO PACK: 17.0000 g | PACK | Freq: Every day | ORAL | Status: DC | PRN

## 2023-11-30 MED ORDER — METOPROLOL SUCCINATE ER 50 MG PO TB24
50.0000 mg | ORAL_TABLET | Freq: Every day | ORAL | Status: DC
  Administered 2023-11-30: 50 mg via ORAL
  Filled 2023-11-30 (×2): qty 2

## 2023-11-30 MED ORDER — MAGNESIUM SULFATE 2 GM/50ML IV SOLN
2.0000 g | Freq: Once | INTRAVENOUS | Status: AC
  Administered 2023-11-30: 2 g via INTRAVENOUS
  Filled 2023-11-30: qty 50

## 2023-11-30 MED ORDER — PANTOPRAZOLE SODIUM 40 MG PO TBEC
40.0000 mg | DELAYED_RELEASE_TABLET | Freq: Every day | ORAL | Status: DC
  Administered 2023-11-30 – 2023-12-02 (×3): 40 mg via ORAL
  Filled 2023-11-30 (×3): qty 1
  Filled 2023-11-30: qty 2

## 2023-11-30 MED ORDER — FLECAINIDE ACETATE 50 MG PO TABS
50.0000 mg | ORAL_TABLET | Freq: Two times a day (BID) | ORAL | Status: DC
  Administered 2023-11-30 – 2023-12-01 (×3): 50 mg via ORAL
  Filled 2023-11-30 (×3): qty 1

## 2023-11-30 MED ORDER — HYDRALAZINE HCL 20 MG/ML IJ SOLN: 10.0000 mg | Freq: Four times a day (QID) | INTRAMUSCULAR | Status: DC | PRN

## 2023-11-30 MED ORDER — INSULIN ASPART 100 UNIT/ML IJ SOLN: 0.0000 [IU] | Freq: Every day | INTRAMUSCULAR | Status: DC

## 2023-11-30 MED ORDER — SODIUM CHLORIDE 0.9 % IV BOLUS
500.0000 mL | Freq: Once | INTRAVENOUS | Status: AC
  Administered 2023-11-30: 500 mL via INTRAVENOUS

## 2023-11-30 MED ORDER — POTASSIUM CHLORIDE CRYS ER 20 MEQ PO TBCR
40.0000 meq | EXTENDED_RELEASE_TABLET | Freq: Once | ORAL | Status: AC
  Administered 2023-11-30: 40 meq via ORAL
  Filled 2023-11-30: qty 2

## 2023-11-30 MED ORDER — INSULIN ASPART 100 UNIT/ML IJ SOLN
0.0000 [IU] | Freq: Three times a day (TID) | INTRAMUSCULAR | Status: DC
  Administered 2023-11-30: 2 [IU] via SUBCUTANEOUS
  Administered 2023-12-01: 3 [IU] via SUBCUTANEOUS
  Administered 2023-12-01: 1 [IU] via SUBCUTANEOUS
  Administered 2023-12-01 – 2023-12-02 (×2): 2 [IU] via SUBCUTANEOUS
  Administered 2023-12-02: 1 [IU] via SUBCUTANEOUS

## 2023-11-30 NOTE — H&P (Signed)
 [facility] History and Physical  Tami Jacobson FMW:[id number] DOB: 06-Jun-1927 DOA: 11/30/2023 PCP: Geofm Glade PARAS, MD  Presented from: [facility] Chief Complaint: Throat pounding, palpitation  History of Present Illness: Tami Jacobson is a 89 y.o. female with PMH significant for DM2, HTN, dysrhythmia on flecainide , breast cancer. Lives in an independent facility. Last night, patient was brought to the ED with complaint of 'throat pounding', possible anxiety attack.   She woke up from her sleep in the middle of the night with dry mouth, difficulty swallowing, palpitation, felt like she was going to pass out Patient had been out of her metoprolol  for about a week.  Reports compliance to her medicines. Also reports that for the last 1 week, she had 2-3 episodes of bowel movement per day  In the ED, patient is afebrile, heart rate 105, blood pressure in 130s, breathing room air Oral exam was normal EKG without acute ischemia Initial troponin 22 elevated trended up to 66 Blood work showed potassium level slightly low at 2.6, BUN/creatinine normal, calcium  low at 6.5 with albumin low at 2.3, serum bicarb low at 15 WBC 3.9, hemoglobin 9.8, platelet 126 Respiratory virus panel unremarkable Chest x-ray without infiltrates Patient was given IV fluid, IV potassium replacement Throat pain improved with viscous lidocaine . Hospitalist service was consulted for observation.  I received this patient as a carryover admission from last night. At the time of my evaluation, patient was propped up in bed.  Not in distress. Alert, awake and oriented x 3.  Hard of hearing. Able to give the details of the history as above. Family not at bedside.  Review of Systems:  All systems were reviewed and were negative unless otherwise mentioned in the HPI   Past medical history: Past Medical History:  Diagnosis Date   Arthritis    left hip   Breast cancer (HCC)    Breast cancer, right (HCC)  11/2016   Family history of breast cancer    Family history of colon cancer    Family history of pancreatic cancer    GERD (gastroesophageal reflux disease)    Non-insulin  dependent type 2 diabetes mellitus (HCC)    PAC (premature atrial contraction)    Personal history of radiation therapy 12/2016   Premature ventricular contraction    Seasonal allergies    Sensitive skin    Vitamin B12 deficiency     Past surgical history: Past Surgical History:  Procedure Laterality Date   ABDOMINAL HYSTERECTOMY  1979   partial   BREAST BIOPSY  07/02/2011   Procedure: BREAST BIOPSY WITH NEEDLE LOCALIZATION;  Surgeon: Alm VEAR Angle, MD;  Location: [facility] OR;  Service: General;  Laterality: Left;  left breast atypical hyperplasia needle localization biopsy   BREAST BIOPSY Left 06/13/2006   BREAST BIOPSY Right 06/27/2008   BREAST BIOPSY Right 09/27/2016   BREAST EXCISIONAL BIOPSY Left 07/02/2011   BREAST LUMPECTOMY Left 07/04/2006   BREAST LUMPECTOMY Right 06/26/2008   BREAST LUMPECTOMY Right 09/27/2016   BREAST LUMPECTOMY WITH RADIOACTIVE SEED LOCALIZATION Right 11/18/2016   Procedure: RIGHT BREAST LUMPECTOMY WITH RADIOACTIVE SEED LOCALIZATION ERAS PATHWAY;  Surgeon: Angle Alm, MD;  Location: [facility];  Service: General;  Laterality: Right;  ERAS PATHWAY   CATARACT EXTRACTION Bilateral    CHOLECYSTECTOMY, LAPAROSCOPIC  07/07/2001   had appendex taking out at the same time   ESOPHAGEAL DILATION  2006   LAPAROSCOPIC APPENDECTOMY  07/07/2001   TONSILLECTOMY  1942    Social History:  reports that  she has never smoked. She has never used smokeless tobacco. She reports that she does not drink alcohol and does not use drugs.  Allergies:  Allergies  Allergen Reactions   Statins Other (See Comments)    GI UPSET   Effexor  [Venlafaxine ]     Constipation, shaking, dizzy   Gabapentin      drowsniess   Cephalexin Other (See Comments)    Reaction not recalled by the patient    Codeine Other (See Comments)    Reaction not recalled by the patient   Lansoprazole Other (See Comments)    Reaction not recalled by the patient   Latex Rash        Levofloxacin Other (See Comments)    Reaction not recalled by the patient   Nitrofurantoin Other (See Comments)    Reaction not recalled by the patient   Rofecoxib Other (See Comments)    Reaction not recalled by the patient    Statins, Effexor  [venlafaxine ], Gabapentin , Cephalexin, Codeine, Lansoprazole, Latex, Levofloxacin, Nitrofurantoin, and Rofecoxib   Family history:  Family History  Problem Relation Age of Onset   Colon cancer Mother 27   Heart attack Mother    Other Father        suicide   Breast cancer Sister 33   Breast cancer Sister 13   Breast cancer Sister 18   Heart disease Sister    Heart disease Maternal Uncle        CABG   Heart disease Brother    COPD Brother    Heart disease Brother    Pancreatic cancer Other        brother's son, dx in his 60s   Breast cancer Niece 52     Physical Exam: Vitals:   11/30/23 0545 11/30/23 0645 11/30/23 0732 11/30/23 1030  BP: (!) 128/58 133/64  (!) 142/116  Pulse: 95 95  81  Resp: 12 15  (!) 23  Temp:   98.3 F (36.8 C)   TempSrc:   Oral   SpO2: 100% 97%  100%   Wt Readings from Last 3 Encounters:  10/31/23 56.2 kg  08/26/23 57.2 kg  08/23/23 56.3 kg   There is no height or weight on file to calculate BMI.  General exam: Pleasant, elderly Caucasian female Skin: No rashes, lesions or ulcers. HEENT: Atraumatic, normocephalic, no obvious bleeding Lungs: Clear to auscultation bilaterally,  CVS: S1, S2, no murmur,   GI/Abd: Soft, nontender, nondistended, bowel sound present,   CNS: Alert, awake, oriented x 3.  Hard of hearing Psychiatry: Mood appropriate Extremities: No pedal edema, no calf tenderness,     ----------------------------------------------------------------------------------------------------------------------------------------- ----------------------------------------------------------------------------------------------------------------------------------------- -----------------------------------------------------------------------------------------------------------------------------------------  Assessment/Plan: Severe hypokalemia Hypomagnesemia Presented with palpitation, anxiety feeling in the setting of low potassium level of 2.6 IV and oral replacement given. Repeat potassium this morning showed an improvement. Magnesium  level also low at 1.7..  In the setting of known history of dysrhythmia I would like to keep magnesium  level above 2.  Replacement ordered. Recent Labs  Lab 11/30/23 0209 11/30/23 0441 11/30/23 0930  K 2.6*  --  5.1  MG  --  1.7  --    Hypocalcemia Calcium  level low at 6.5 in the setting of low of 2.3.  Corrected calcium  level is still low at 8. I had planned for calcium  gluconate but calcium  level on repeat BMP showed improvement to 9.9. Recent Labs    01/02/23 0649 04/12/23 1436 10/31/23 1339 11/30/23 0209 11/30/23 0930  CALCIUM  9.4 9.9 9.9 6.5* 9.9  ALBUMIN  --  4.1 4.3 2.3*  --    Mild diarrhea Reports 2-3 bowel moods a day for last 1 week, not since yesterday.  It seems patient had loss of electrolytes as above due to diarrhea.  Elevated troponin Uptrending Obtain echo to look for WMA. No prior history of CAD Recent Labs    11/30/23 0209 11/30/23 0440 11/30/23 0802  TROPONINIHS 22* 66* 98*   Palpitations  h/o dysrhythmia Woke up with palpitation.  Likely secondary to low electrolytes and missing her meds.   By the time of her home heart rate was only 100s  EKG with normal sinus rhythm PTA meds- flecainide  50 mg twice daily, Toprol  50 mg daily Reportedly ran out of meds recently, not sure if only 1 or all.. In any  case, I have resumed both of them.   Hypertension PTA meds- Toprol  50 mg daily, amlodipine  2.5 mg daily, losartan  100 mg daily Currently blood pressure in 130s.   Resume Toprol .  Keep amlodipine  and losartan  on hold.  Type 2 diabetes mellitus A1c 6.5 in July 2025 PTA meds- metformin . I would keep metformin  on hold Start SSI/Accu-Cheks Recent Labs  Lab 11/30/23 1128  GLUCAP 178*   Mild pancytopenia All 3 cell lines are slightly depressed.  Viral illness hurst Recent Labs  Lab 11/30/23 0209  WBC 3.9*  NEUTROABS 2.8  HGB 9.8*  HCT 29.5*  MCV 97.7  PLT 126*   Throat pain Throat pain improved with viscous lidocaine .  Mobility: Encourage ambulation PT Orders:   PT Follow up Rec:    Goals of care:   Code Status: Full Code    DVT prophylaxis:  enoxaparin  (LOVENOX ) injection 40 mg Start: 11/30/23 0830   Antimicrobials: None Fluid: None Consultants: None Family Communication: None at bedside  Status: Observation Level of care:  Telemetry Cardiac   Patient is from: Independent leaving facility Anticipated d/c to: Back to memory living facility in 1 to 2 days  Diet:  Diet Order             Diet heart healthy/carb modified Fluid consistency: Thin  Diet effective now                    ------------------------------------------------------------------------------------- Severity of Illness: The appropriate patient status for this patient is OBSERVATION. Observation status is judged to be reasonable and necessary in order to provide the required intensity of service to ensure the patient's safety. The patient's presenting symptoms, physical exam findings, and initial radiographic and laboratory data in the context of their medical condition is felt to place them at decreased risk for further clinical deterioration. Furthermore, it is anticipated that the patient will be medically stable for discharge from the hospital within 2 midnights of admission.   -------------------------------------------------------------------------------------  Home Meds: Prior to Admission medications   Medication Sig Start Date End Date Taking? Authorizing Provider  acetaminophen  (TYLENOL  8 HOUR) 650 MG CR tablet Take 650 mg by mouth daily as needed for pain.   Yes [provider]  amLODipine  (NORVASC ) 2.5 MG tablet TAKE 1 TABLET BY MOUTH DAILY FOR BLOOD PRESSURE 08/12/23  Yes Burns, Glade PARAS, MD  cyanocobalamin  (VITAMIN B12) 1000 MCG tablet Take 1,000 mcg by mouth daily.   Yes [provider]  diphenhydramine-acetaminophen  (TYLENOL  PM) 25-500 MG TABS tablet Take 1 tablet by mouth at bedtime as needed (for sleep).   Yes [provider]  flecainide  (TAMBOCOR ) 50 MG tablet TAKE 1 TABLET BY MOUTH TWICE  DAILY 11/28/23  Yes Pietro Rogue  S, MD  losartan  (COZAAR ) 100 MG tablet TAKE 1 TABLET BY MOUTH DAILY FOR BLOOD PRESSURE 08/12/23  Yes Burns, Glade PARAS, MD  metFORMIN  (GLUCOPHAGE -XR) 500 MG 24 hr tablet TAKE 1 TABLET BY MOUTH DAILY  WITH BREAKFAST FOR YOUR SUGAR 04/26/23  Yes Burns, Glade PARAS, MD  metoprolol  succinate (TOPROL -XL) 50 MG 24 hr tablet TAKE 1 TABLET BY MOUTH DAILY 11/14/23  Yes Pietro Redell RAMAN, MD  omeprazole  (PRILOSEC) 40 MG capsule TAKE 1 CAPSULE BY MOUTH DAILY 10/10/23  Yes Geofm Glade PARAS, MD  Blood Glucose Monitoring Suppl DEVI UAD to check sugars.  E11.9.  May substitute to any manufacturer covered by patient's insurance. 01/12/23   Geofm Glade PARAS, MD  Glucose Blood (BLOOD GLUCOSE TEST STRIPS) STRP UAD to check sugars daily and prn.  E11.9.  May substitute to any manufacturer covered by patient's insurance. 01/12/23   Geofm Glade PARAS, MD  Lancet Device MISC UAD for help check sugars.  E11.9.  May substitute to any manufacturer covered by patient's insurance. 01/12/23   Geofm Glade PARAS, MD  Lancets Misc. MISC UAD to check sugars daily and prn.  E11.9.  May substitute to any manufacturer covered by patient's insurance. 01/12/23   Geofm Glade PARAS, MD    Labs on Admission:   CBC: Recent Labs  Lab 11/30/23 0209  WBC 3.9*  NEUTROABS 2.8  HGB 9.8*  HCT 29.5*  MCV 97.7  PLT 126*    Basic Metabolic Panel: Recent Labs  Lab 11/30/23 0209 11/30/23 0441 11/30/23 0930  NA 140  --  137  K 2.6*  --  5.1  CL 118*  --  106  CO2 15*  --  23  GLUCOSE 118*  --  165*  BUN 7*  --  9  CREATININE 0.62  --  0.86  CALCIUM  6.5*  --  9.9  MG  --  1.7  --     Liver Function Tests: Recent Labs  Lab 11/30/23 0209  AST 15  ALT 8  ALKPHOS 43  BILITOT 0.7  PROT 4.4*  ALBUMIN 2.3*   No results for input(s): LIPASE, AMYLASE in the last 168 hours. No results for input(s): AMMONIA in the last 168 hours.  Cardiac Enzymes: No results for input(s): CKTOTAL, CKMB, CKMBINDEX, TROPONINI in the last 168 hours.  BNP (last 3 results) No results for input(s): BNP in the last 8760 hours.  ProBNP (last 3 results) No results for input(s): PROBNP in the last 8760 hours.  CBG: Recent Labs  Lab 11/30/23 1128  GLUCAP 178*    Lipase     Component Value Date/Time   LIPASE 10.0 (L) 02/10/2016 1029     Urinalysis    Component Value Date/Time   COLORURINE YELLOW (A) 03/06/2022 0105   APPEARANCEUR CLEAR (A) 03/06/2022 0105   LABSPEC <1.005 (L) 03/06/2022 0105   PHURINE 6.0 03/06/2022 0105   GLUCOSEU NEGATIVE 03/06/2022 0105   HGBUR NEGATIVE 03/06/2022 0105   BILIRUBINUR NEGATIVE 03/06/2022 0105   KETONESUR NEGATIVE 03/06/2022 0105   PROTEINUR NEGATIVE 03/06/2022 0105   NITRITE NEGATIVE 03/06/2022 0105   LEUKOCYTESUR NEGATIVE 03/06/2022 0105     Drugs of Abuse  No results found for: LABOPIA, COCAINSCRNUR, LABBENZ, AMPHETMU, THCU, LABBARB    Radiological Exams on Admission: DG Chest 2 View Result Date: 11/30/2023 CLINICAL DATA:  Tachycardia EXAM: CHEST - 2 VIEW COMPARISON:  03/24/22 FINDINGS: Cardiac shadow is within normal limits. Aortic calcifications are noted. The lungs are well aerated  bilaterally. No focal  infiltrate or effusion is seen. No bony abnormality is noted. IMPRESSION: No active cardiopulmonary disease. Electronically Signed   By: Oneil Devonshire M.D.   On: 11/30/2023 02:44     Signed, Chapman Rota, MD [facility] 11/30/2023

## 2023-11-30 NOTE — Progress Notes (Signed)
 2D echo attempted, patient care needed. Will try later

## 2023-11-30 NOTE — ED Notes (Signed)
Phlebotomy to collect troponin

## 2023-11-30 NOTE — ED Triage Notes (Signed)
 PT BIB EMS FROM [facility] WITH C/O THROAT POUNDING AND POSSIBLE ANXIETY. EMS ADVISES THAT PT HAS BEEN OUT OF HER B/P MEDS FOR ABOUT 1 WEEK AND PT HAS BEEN WORRIED ABOUT IT

## 2023-11-30 NOTE — ED Provider Notes (Signed)
 [place] EMERGENCY DEPARTMENT AT [facility] Provider Note   CSN: [id number] Arrival date & time: 11/30/23  0126     Patient presents with: Anxiety   Tami Jacobson is a 89 y.o. female.   Patient with a history of hypertension, diabetes presents with throat and neck pain.  Symptoms started about midnight that woke her from sleep.  Complains of dry mouth and difficulty swallowing.  Had pain in her throat which has since improved but not resolved.  Denies chest pain.  States she felt like she was going to pass out and felt palpitations and short of breath.  Never did have chest pain.  Is concerned because she has been her metoprolol  for the past 1 week.  Concerned that she could be having a heart attack. Denies any history of CAD or stents.  Has never had this pain previously. No nausea, vomiting or diaphoresis.  Some associated shortness of breath.  The history is provided by the patient.  Anxiety Associated symptoms include shortness of breath. Pertinent negatives include no chest pain, no abdominal pain and no headaches.       Prior to Admission medications   Medication Sig Start Date End Date Taking? Authorizing Provider  acetaminophen  (TYLENOL  8 HOUR) 650 MG CR tablet Take 2 tablets every 8 hours by oral route.    [provider]  amLODipine  (NORVASC ) 2.5 MG tablet TAKE 1 TABLET BY MOUTH DAILY FOR BLOOD PRESSURE 08/12/23   Geofm Glade PARAS, MD  Blood Glucose Monitoring Suppl DEVI UAD to check sugars.  E11.9.  May substitute to any manufacturer covered by patient's insurance. 01/12/23   Geofm Glade PARAS, MD  clobetasol cream (TEMOVATE) 0.05 % APPLY CREAM TOPICALLY TWICE DAILY AS NEEDED FOR IRRITATION FROM LICHEN SCLEROSUS    [provider]  cyanocobalamin  (,VITAMIN B-12,) 1000 MCG/ML injection Inject 1,000 mcg into the muscle every 30 (thirty) days.    [provider]  flecainide  (TAMBOCOR ) 50 MG tablet TAKE 1 TABLET BY MOUTH TWICE  DAILY  11/28/23   Pietro Redell RAMAN, MD  Glucose Blood (BLOOD GLUCOSE TEST STRIPS) STRP UAD to check sugars daily and prn.  E11.9.  May substitute to any manufacturer covered by patient's insurance. 01/12/23   Geofm Glade PARAS, MD  Lancet Device MISC UAD for help check sugars.  E11.9.  May substitute to any manufacturer covered by patient's insurance. 01/12/23   Geofm Glade PARAS, MD  Lancets Misc. MISC UAD to check sugars daily and prn.  E11.9.  May substitute to any manufacturer covered by patient's insurance. 01/12/23   Geofm Glade PARAS, MD  losartan  (COZAAR ) 100 MG tablet TAKE 1 TABLET BY MOUTH DAILY FOR BLOOD PRESSURE 08/12/23   Geofm Glade PARAS, MD  metFORMIN  (GLUCOPHAGE -XR) 500 MG 24 hr tablet TAKE 1 TABLET BY MOUTH DAILY  WITH BREAKFAST FOR YOUR SUGAR 04/26/23   Burns, Glade PARAS, MD  metoprolol  succinate (TOPROL -XL) 50 MG 24 hr tablet TAKE 1 TABLET BY MOUTH DAILY 11/14/23   Pietro Redell RAMAN, MD  omeprazole  (PRILOSEC) 40 MG capsule TAKE 1 CAPSULE BY MOUTH DAILY 10/10/23   Geofm Glade PARAS, MD    Allergies: Statins, Effexor  [venlafaxine ], Gabapentin , Cephalexin, Codeine, Lansoprazole, Latex, Levofloxacin, Nitrofurantoin, and Rofecoxib    Review of Systems  Constitutional:  Negative for activity change, appetite change and fatigue.  HENT:  Positive for sore throat. Negative for congestion.   Respiratory:  Positive for shortness of breath. Negative for chest tightness.   Cardiovascular:  Negative for chest  pain.  Gastrointestinal:  Negative for abdominal pain, nausea and vomiting.  Genitourinary:  Negative for dysuria and hematuria.  Musculoskeletal:  Negative for arthralgias and myalgias.  Skin:  Negative for color change and rash.  Neurological:  Negative for dizziness, weakness and headaches.   all other systems are negative except as noted in the HPI and PMH.    Updated Vital Signs BP 137/71   Pulse (!) 105   Temp 98.6 F (37 C)   Resp 18   SpO2 99%   Physical Exam Vitals and nursing note reviewed.   Constitutional:      General: She is not in acute distress.    Appearance: She is well-developed.  HENT:     Head: Normocephalic and atraumatic.     Mouth/Throat:     Mouth: Mucous membranes are dry.     Pharynx: No oropharyngeal exudate.     Comments: Controlling secretions, uvula midline, no asymmetry Eyes:     Conjunctiva/sclera: Conjunctivae normal.     Pupils: Pupils are equal, round, and reactive to light.  Neck:     Comments: No meningismus. Cardiovascular:     Rate and Rhythm: Normal rate and regular rhythm.     Heart sounds: Normal heart sounds. No murmur heard. Pulmonary:     Effort: Pulmonary effort is normal. No respiratory distress.     Breath sounds: Normal breath sounds.  Abdominal:     Palpations: Abdomen is soft.     Tenderness: There is no abdominal tenderness. There is no guarding or rebound.  Musculoskeletal:        General: No tenderness. Normal range of motion.     Cervical back: Normal range of motion and neck supple.  Skin:    General: Skin is warm.  Neurological:     Mental Status: She is alert and oriented to person, place, and time.     Cranial Nerves: No cranial nerve deficit.     Motor: No abnormal muscle tone.     Coordination: Coordination normal.     Comments:  5/5 strength throughout. CN 2-12 intact.Equal grip strength.   Psychiatric:        Behavior: Behavior normal.     (all labs ordered are listed, but only abnormal results are displayed) Labs Reviewed  CBC WITH DIFFERENTIAL/PLATELET - Abnormal; Notable for the following components:      Result Value   WBC 3.9 (*)    RBC 3.02 (*)    Hemoglobin 9.8 (*)    HCT 29.5 (*)    Platelets 126 (*)    Lymphs Abs 0.6 (*)    All other components within normal limits  COMPREHENSIVE METABOLIC PANEL WITH GFR - Abnormal; Notable for the following components:   Potassium 2.6 (*)    Chloride 118 (*)    CO2 15 (*)    Glucose, Bld 118 (*)    BUN 7 (*)    Calcium  6.5 (*)    Total Protein 4.4  (*)    Albumin 2.3 (*)    All other components within normal limits  TROPONIN I (HIGH SENSITIVITY) - Abnormal; Notable for the following components:   Troponin I (High Sensitivity) 22 (*)    All other components within normal limits  GROUP A STREP BY PCR  RESP PANEL BY RT-PCR (RSV, FLU A&B, COVID)  RVPGX2  PROTIME-INR  MAGNESIUM   TROPONIN I (HIGH SENSITIVITY)    EKG: EKG Interpretation Date/Time:  Wednesday November 30 2023 01:39:18 EDT Ventricular Rate:  96 PR  Interval:    QRS Duration:  110 QT Interval:  387 QTC Calculation: 490 R Axis:   -79  Text Interpretation: Sinus rhythm Abnormal R-wave progression, late transition Inferior infarct, old Lateral leads are also involved Confirmed by Carita Senior [phone]) on 11/30/2023 1:41:58 AM  Radiology: [facility] Chest 2 View Result Date: 11/30/2023 CLINICAL DATA:  Tachycardia EXAM: CHEST - 2 VIEW COMPARISON:  03/24/22 FINDINGS: Cardiac shadow is within normal limits. Aortic calcifications are noted. The lungs are well aerated bilaterally. No focal infiltrate or effusion is seen. No bony abnormality is noted. IMPRESSION: No active cardiopulmonary disease. Electronically Signed   By: Oneil Devonshire M.D.   On: 11/30/2023 02:44     Procedures   Medications Ordered in the ED - No data to display                                  Medical Decision Making Amount and/or Complexity of Data Reviewed Labs: ordered. Decision-making details documented in ED Course. Radiology: ordered and independent interpretation performed. Decision-making details documented in ED Course. ECG/medicine tests: ordered and independent interpretation performed. Decision-making details documented in ED Course.  Risk Prescription drug management. Decision regarding hospitalization.   Throat pain with shortness of breath and near syncope that woke her from sleep.  Vitals are stable.  EKG without acute ischemia.  Some concern for anginal equivalent.  Will obtain labs and  imaging.  Throat pain improved with viscous lidocaine .  EKG without acute ischemia.  Initial troponin 22.  Unclear significance.  Will trend.  Chest x-ray negative for infiltrate.  Labs significant for severe hypokalemia of 2.6.  IV and p.o. supplementation ordered. Also with bicarb of 15.  Will give IV fluids.  Plan admission for correction of her hypokalemia as well as trending her troponin.  Unclear whether this represents anginal equivalent.  Admission discussed with Dr. Charlton.     Final diagnoses:  None    ED Discharge Orders     None          Angeliz Settlemyre, Senior, MD 11/30/23 0330

## 2023-12-01 ENCOUNTER — Observation Stay (HOSPITAL_BASED_OUTPATIENT_CLINIC_OR_DEPARTMENT_OTHER)

## 2023-12-01 DIAGNOSIS — E876 Hypokalemia: Secondary | ICD-10-CM | POA: Diagnosis not present

## 2023-12-01 DIAGNOSIS — D61818 Other pancytopenia: Secondary | ICD-10-CM | POA: Diagnosis not present

## 2023-12-01 DIAGNOSIS — I441 Atrioventricular block, second degree: Secondary | ICD-10-CM | POA: Diagnosis not present

## 2023-12-01 DIAGNOSIS — R7989 Other specified abnormal findings of blood chemistry: Secondary | ICD-10-CM | POA: Diagnosis not present

## 2023-12-01 DIAGNOSIS — R9431 Abnormal electrocardiogram [ECG] [EKG]: Secondary | ICD-10-CM | POA: Diagnosis not present

## 2023-12-01 DIAGNOSIS — I10 Essential (primary) hypertension: Secondary | ICD-10-CM | POA: Diagnosis not present

## 2023-12-01 DIAGNOSIS — E119 Type 2 diabetes mellitus without complications: Secondary | ICD-10-CM | POA: Diagnosis not present

## 2023-12-01 DIAGNOSIS — Z79899 Other long term (current) drug therapy: Secondary | ICD-10-CM | POA: Diagnosis not present

## 2023-12-01 DIAGNOSIS — Z7984 Long term (current) use of oral hypoglycemic drugs: Secondary | ICD-10-CM | POA: Diagnosis not present

## 2023-12-01 DIAGNOSIS — I440 Atrioventricular block, first degree: Secondary | ICD-10-CM | POA: Diagnosis not present

## 2023-12-01 DIAGNOSIS — R197 Diarrhea, unspecified: Secondary | ICD-10-CM | POA: Diagnosis not present

## 2023-12-01 DIAGNOSIS — I4891 Unspecified atrial fibrillation: Secondary | ICD-10-CM | POA: Diagnosis not present

## 2023-12-01 LAB — ECHOCARDIOGRAM COMPLETE
AR max vel: 3.25 cm2
AV Area VTI: 3.83 cm2
AV Area mean vel: 2.91 cm2
AV Mean grad: 3 mmHg
AV Peak grad: 5.1 mmHg
Ao pk vel: 1.13 m/s
Area-P 1/2: 2.71 cm2
Calc EF: 74.4 %
Height: 63 in
MV M vel: 4.95 m/s
MV Peak grad: 98 mmHg
MV VTI: 2.65 cm2
S' Lateral: 2.4 cm
Single Plane A2C EF: 78.7 %
Single Plane A4C EF: 68.5 %
Weight: 1904.77 [oz_av]

## 2023-12-01 LAB — BASIC METABOLIC PANEL WITH GFR
Anion gap: 8 (ref 5–15)
BUN: 11 mg/dL (ref 8–23)
CO2: 24 mmol/L (ref 22–32)
Calcium: 9.8 mg/dL (ref 8.9–10.3)
Chloride: 105 mmol/L (ref 98–111)
Creatinine, Ser: 0.92 mg/dL (ref 0.44–1.00)
GFR, Estimated: 58 mL/min — ABNORMAL LOW (ref >60)
Glucose, Bld: 127 mg/dL — ABNORMAL HIGH (ref 70–99)
Potassium: 4.7 mmol/L (ref 3.5–5.1)
Sodium: 137 mmol/L (ref 135–145)

## 2023-12-01 LAB — CBC
HCT: 31.3 % — ABNORMAL LOW (ref 36.0–46.0)
Hemoglobin: 10.7 g/dL — ABNORMAL LOW (ref 12.0–15.0)
MCH: 32.6 pg (ref 26.0–34.0)
MCHC: 34.2 g/dL (ref 30.0–36.0)
MCV: 95.4 fL (ref 80.0–100.0)
Platelets: 147 K/uL — ABNORMAL LOW (ref 150–400)
RBC: 3.28 MIL/uL — ABNORMAL LOW (ref 3.87–5.11)
RDW: 13.7 % (ref 11.5–15.5)
WBC: 4.8 K/uL (ref 4.0–10.5)
nRBC: 0 % (ref 0.0–0.2)

## 2023-12-01 LAB — TROPONIN I (HIGH SENSITIVITY)
Troponin I (High Sensitivity): 110 ng/L (ref <18)
Troponin I (High Sensitivity): 92 ng/L — ABNORMAL HIGH (ref <18)

## 2023-12-01 LAB — GLUCOSE, CAPILLARY
Glucose-Capillary: 133 mg/dL — ABNORMAL HIGH (ref 70–99)
Glucose-Capillary: 169 mg/dL — ABNORMAL HIGH (ref 70–99)
Glucose-Capillary: 154 mg/dL — ABNORMAL HIGH (ref 70–99)
Glucose-Capillary: 126 mg/dL — ABNORMAL HIGH (ref 70–99)

## 2023-12-01 LAB — MAGNESIUM: Magnesium: 2 mg/dL (ref 1.7–2.4)

## 2023-12-01 LAB — TSH: TSH: 2.066 u[IU]/mL (ref 0.350–4.500)

## 2023-12-01 MED ORDER — ORAL CARE MOUTH RINSE: 15.0000 mL | OROMUCOSAL | Status: DC | PRN

## 2023-12-01 NOTE — Plan of Care (Signed)

## 2023-12-01 NOTE — Progress Notes (Signed)
 Mobility Specialist Progress Note:    12/01/23 1405  Mobility  Activity Pivoted/transferred from chair to bed  Level of Assistance Minimal assist, patient does 75% or more  Assistive Device Other (Comment) (HHA)  Distance Ambulated (ft) 3 ft  Activity Response Tolerated well  Mobility Referral Yes  Mobility visit 1 Mobility  Mobility Specialist Start Time (ACUTE ONLY) 1405  Mobility Specialist Stop Time (ACUTE ONLY) 1417  Mobility Specialist Time Calculation (min) (ACUTE ONLY) 12 min   Pt pleasant and agreeable to getting back to bed. Moving well and ambulating well. No C/o any symptoms. Returned pt to bed w/ all needs met.   Venetia Keel Mobility Specialist Please Neurosurgeon or Rehab Office at [phone]

## 2023-12-01 NOTE — Evaluation (Signed)
 Physical Therapy Brief Evaluation and Discharge Note Patient Details Name: Tami Jacobson MRN: [id number] DOB: 11-26-1927 Today's Date: 12/01/2023   History of Present Illness  Pt is a 89 y/o female admitted 8/27 to ED with c/o throat pounding, possible anxiety attack.  Work up continues.  Arthritis L hip, Breast CA, DM, PAC,  Clinical Impression  Pt is at or close to baseline functioning and should be safe at home with PRN help from her significant other. There are no further acute PT needs.  Will sign off at this time.        PT Assessment    Assistance Needed at Discharge       Equipment Recommendations None recommended by PT  Recommendations for Other Services       Precautions/Restrictions Precautions Precautions: Fall (lower fall risk) Recall of Precautions/Restrictions: Intact        Mobility  Bed Mobility          Transfers Overall transfer level: Modified independent                      Ambulation/Gait Ambulation/Gait assistance: Modified independent (Device/Increase time) Gait Distance (Feet): 400 Feet Assistive device: Rollator (4 wheels) Gait Pattern/deviations: Step-through pattern   General Gait Details: generally steady with no AD in home-like environment to rollator with longer distance.  Minor waggle with rollator that does not produce any instability with pt.  Pt used brakes on the rollator appropriately.  Home Activity Instructions    Stairs            Modified Rankin (Stroke Patients Only)        Balance     Sitting balance-Leahy Scale: Normal       Standing balance-Leahy Scale: Fair            Pertinent Vitals/Pain   Pain Assessment Pain Assessment: Faces Pain Score: 0-No pain Pain Intervention(s): Monitored during session     Home Living   Living Arrangements: Spouse/significant other       Home Equipment: Rollator (4 wheels);BSC/3in1;Shower seat;Grab bars - toilet;Grab bars -  tub/shower        Prior Function        UE/LE Assessment               Communication   Communication Communication: No apparent difficulties     Cognition         General Comments General comments (skin integrity, edema, etc.): VSS, HR between 60 and low 80's bpm during activity.  Sats appropriate in the 90's    Exercises     Assessment/Plan    PT Problem List         PT Visit Diagnosis Unsteadiness on feet (R26.81)    No Skilled PT Patient will have necessary level of assist by caregiver at discharge;Patient is modified independent with all activity/mobility   Co-evaluation                AMPAC 6 Clicks Help needed turning from your back to your side while in a flat bed without using bedrails?: None Help needed moving from lying on your back to sitting on the side of a flat bed without using bedrails?: None Help needed moving to and from a bed to a chair (including a wheelchair)?: None Help needed standing up from a chair using your arms (e.g., wheelchair or bedside chair)?: None Help needed to walk in hospital room?: None Help needed climbing 3-5 steps with a railing? :  A Little 6 Click Score: 23      End of Session   Activity Tolerance: Patient tolerated treatment well Patient left: in chair;with call bell/phone within reach Nurse Communication: Mobility status PT Visit Diagnosis: Unsteadiness on feet (R26.81)     Time: 8952-8880 PT Time Calculation (min) (ACUTE ONLY): 32 min  Charges:   PT Evaluation $PT Eval Moderate Complexity: 1 Mod PT Treatments $Gait Training: 8-22 mins    12/01/2023  India HERO., PT Acute Rehabilitation Services [phone]  (office)  Vinie GAILS Lynne Takemoto  12/01/2023, 11:29 AM

## 2023-12-01 NOTE — Progress Notes (Addendum)
   11/30/23 2353  ECG Monitoring  CV Strip Heart Rate 33  Cardiac Rhythm SB;Heart block;Other (Comment) (CONVERTED IN/OUT 2 TYPE 2- RN TINE NOTIFIED)  Heart Block Type 2nd degree AVB (Mobitz II)   2353H- Central Telemetry notified that patient had episode of Bradycardia as low as 33bpm, tagged as 2nd degree AV block. Seen patient asleep, rousable, asymptomatic. Rechecked vital signs and notified Dr. Alfornia, new order was placed. EKG completed and updated MD. STAT labs test facilitated. Continue to provide care per plan, keep safety precautions in place.

## 2023-12-01 NOTE — Consult Note (Addendum)
 Cardiology Consultation   Patient ID: Tami Jacobson MRN: [id number]; DOB: 22-Mar-1928  Admit date: 11/30/2023 Date of Consult: 12/01/2023  PCP:  Geofm Glade PARAS, MD   [facility] Providers Cardiologist:  Redell Shallow, MD      Patient Profile: Tami Jacobson is a 89 y.o. female with a hx of breast cancer with radiation therapy on 12/2016, coronary calcifications, cerebrovascular disease, palpitations, PACs, and PVCs with normal LV function previously on flecainide  and metoprolol  who is being seen 12/01/2023 for the evaluation of elevated troponins at the request of Ava Swayze DO.  History of Present Illness: Tami Jacobson is a 89 year old female with prior cardiac history listed below  Has a remote history of having a catheterization done in 2000 that found normal coronary arteries. Chest CT on 02/2020 showed coronary calcifications and aortic atherosclerosis.   Patient wore a Holter monitor before 2013 that showed PVCs.  These PVCs reportedly correlated with the patient's symptoms. Patient was originally placed on beta-blockers for her palpitations but continued to have symptoms.  Because of this she was placed on flecainide .  Because of dyspnea on exertion and palpitations a nuclear stress test was done on 05/2015 that showed normal wall motion, normal perfusion, and was read as low risk.  Most recent echocardiogram prior to admission was done on 03/2020 and showed an LVEF of 60 to 65%, no RWMA, severe concentric LVH, G2 DD, mild left atrial dilation, mild MR, and mild to moderate pulmonic regurgitation.  On 12/2022 patient had an mechanical fall and developed a subdural hematoma.  Patient presented to the emergency department for throat pain, and weakness.  On arrival the patient was found to be hypokalemic with a potassium of 2.6.  Last night patient's telemetry showed that she was bradycardic and was having episodes of 2:1 AV block.  On interview patient reported  that presented to the emergency department because she felt weak, anxious, and had a throat pain.  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  This was part of the reason why she was anxious.  Denies any chest pain, shortness of breath, fever, chills, nausea, vomiting, diaphoresis, palpitations, dizziness, and lightheadedness.  Patient denies wearing any cardiac monitor recently.  Regularly walks about 300 m indoors using a walker.  Denies alcohol use, tobacco use, illicit substance use.  Reported that she drinks decaffeinated coffee.  Labs showed potassium of 4.7, creatinine of 0.92 with a GFR of 58, magnesium  of 1.7, hemoglobin of 10.7, platelet count of 147, and High-sensitivity troponins 22 > 66 > 98 > 110 > 92.  EKG showed normal sinus rhythm with a rate of 71, first-degree AV block, and left anterior fascicular block.  Chest x-ray showed no active cardiopulmonary disease.  Echo showed an LVEF of 60-65%, No RWMA, G1DD, normal RV function, mild pulmonic regurgitation, and a variable IVC.   Past Medical History:  Diagnosis Date   Arthritis    left hip   Breast cancer (HCC)    Breast cancer, right (HCC) 11/2016   Family history of breast cancer    Family history of colon cancer    Family history of pancreatic cancer    GERD (gastroesophageal reflux disease)    Non-insulin  dependent type 2 diabetes mellitus (HCC)    PAC (premature atrial contraction)    Personal history of radiation therapy 12/2016   Premature ventricular contraction    Seasonal allergies    Sensitive  skin    Vitamin B12 deficiency     Past Surgical History:  Procedure Laterality Date   ABDOMINAL HYSTERECTOMY  1979   partial   BREAST BIOPSY  07/02/2011   Procedure: BREAST BIOPSY WITH NEEDLE LOCALIZATION;  Surgeon: Alm VEAR Angle, MD;  Location: [facility] OR;  Service: General;  Laterality: Left;  left breast atypical hyperplasia needle localization biopsy   BREAST  BIOPSY Left 06/13/2006   BREAST BIOPSY Right 06/27/2008   BREAST BIOPSY Right 09/27/2016   BREAST EXCISIONAL BIOPSY Left 07/02/2011   BREAST LUMPECTOMY Left 07/04/2006   BREAST LUMPECTOMY Right 06/26/2008   BREAST LUMPECTOMY Right 09/27/2016   BREAST LUMPECTOMY WITH RADIOACTIVE SEED LOCALIZATION Right 11/18/2016   Procedure: RIGHT BREAST LUMPECTOMY WITH RADIOACTIVE SEED LOCALIZATION ERAS PATHWAY;  Surgeon: Angle Alm, MD;  Location: [facility];  Service: General;  Laterality: Right;  ERAS PATHWAY   CATARACT EXTRACTION Bilateral    CHOLECYSTECTOMY, LAPAROSCOPIC  07/07/2001   had appendex taking out at the same time   ESOPHAGEAL DILATION  2006   LAPAROSCOPIC APPENDECTOMY  07/07/2001   TONSILLECTOMY  1942     Home Medications:  Prior to Admission medications   Medication Sig Start Date End Date Taking? Authorizing Provider  acetaminophen  (TYLENOL  8 HOUR) 650 MG CR tablet Take 650 mg by mouth daily as needed for pain.   Yes [provider]  amLODipine  (NORVASC ) 2.5 MG tablet TAKE 1 TABLET BY MOUTH DAILY FOR BLOOD PRESSURE 08/12/23  Yes Burns, Glade PARAS, MD  cyanocobalamin  (VITAMIN B12) 1000 MCG tablet Take 1,000 mcg by mouth daily.   Yes [provider]  diphenhydramine-acetaminophen  (TYLENOL  PM) 25-500 MG TABS tablet Take 1 tablet by mouth at bedtime as needed (for sleep).   Yes [provider]  flecainide  (TAMBOCOR ) 50 MG tablet TAKE 1 TABLET BY MOUTH TWICE  DAILY 11/28/23  Yes Pietro Redell RAMAN, MD  losartan  (COZAAR ) 100 MG tablet TAKE 1 TABLET BY MOUTH DAILY FOR BLOOD PRESSURE 08/12/23  Yes Burns, Glade PARAS, MD  metFORMIN  (GLUCOPHAGE -XR) 500 MG 24 hr tablet TAKE 1 TABLET BY MOUTH DAILY  WITH BREAKFAST FOR YOUR SUGAR 04/26/23  Yes Burns, Glade PARAS, MD  metoprolol  succinate (TOPROL -XL) 50 MG 24 hr tablet TAKE 1 TABLET BY MOUTH DAILY 11/14/23  Yes Pietro Redell RAMAN, MD  omeprazole  (PRILOSEC) 40 MG capsule TAKE 1 CAPSULE BY MOUTH DAILY 10/10/23  Yes Geofm Glade PARAS, MD  Blood Glucose Monitoring Suppl DEVI UAD to check sugars.  E11.9.  May substitute to any manufacturer covered by patient's insurance. 01/12/23   Geofm Glade PARAS, MD  Glucose Blood (BLOOD GLUCOSE TEST STRIPS) STRP UAD to check sugars daily and prn.  E11.9.  May substitute to any manufacturer covered by patient's insurance. 01/12/23   Geofm Glade PARAS, MD  Lancet Device MISC UAD for help check sugars.  E11.9.  May substitute to any manufacturer covered by patient's insurance. 01/12/23   Geofm Glade PARAS, MD  Lancets Misc. MISC UAD to check sugars daily and prn.  E11.9.  May substitute to any manufacturer covered by patient's insurance. 01/12/23   Geofm Glade PARAS, MD    Scheduled Meds:  enoxaparin  (LOVENOX ) injection  40 mg Subcutaneous Q24H   flecainide   50 mg Oral BID   insulin  aspart  0-5 Units Subcutaneous QHS   insulin  aspart  0-9 Units Subcutaneous TID WC   pantoprazole   40 mg Oral Daily   Continuous Infusions:  PRN Meds: acetaminophen  **OR** acetaminophen , albuterol , bisacodyl , hydrALAZINE , polyethylene glycol  Allergies:    Allergies  Allergen Reactions   Statins Other (See Comments)    GI UPSET   Effexor  [Venlafaxine ]     Constipation, shaking, dizzy   Gabapentin      drowsniess   Cephalexin Other (See Comments)    Reaction not recalled by the patient   Codeine Other (See Comments)    Reaction not recalled by the patient   Lansoprazole Other (See Comments)    Reaction not recalled by the patient   Latex Rash        Levofloxacin Other (See Comments)    Reaction not recalled by the patient   Nitrofurantoin Other (See Comments)    Reaction not recalled by the patient   Rofecoxib Other (See Comments)    Reaction not recalled by the patient     Social History:   Social History   Socioeconomic History   Marital status: Divorced    Spouse name: Debby   Number of children: 1   Years of education: Not on file   Highest education level: Not on file  Occupational History    Occupation: retire  Tobacco Use   Smoking status: Never   Smokeless tobacco: Never  Vaping Use   Vaping status: Never Used  Substance and Sexual Activity   Alcohol use: No    Alcohol/week: 0.0 standard drinks of alcohol   Drug use: No   Sexual activity: Not Currently  Other Topics Concern   Not on file  Social History Narrative   Lives with a friend-per pt/2025   Social Drivers of Health   Financial Resource Strain: Low Risk  (08/26/2023)   Overall Financial Resource Strain (CARDIA)    Difficulty of Paying Living Expenses: Not hard at all  Food Insecurity: No Food Insecurity (08/26/2023)   Hunger Vital Sign    Worried About Running Out of Food in the Last Year: Never true    Ran Out of Food in the Last Year: Never true  Transportation Needs: No Transportation Needs (10/06/2022)   PRAPARE - Administrator, Civil Service (Medical): No    Lack of Transportation (Non-Medical): No  Physical Activity: Insufficiently Active (08/26/2023)   Exercise Vital Sign    Days of Exercise per Week: 7 days    Minutes of Exercise per Session: 10 min  Stress: No Stress Concern Present (08/26/2023)   [employer] of Occupational Health - Occupational Stress Questionnaire    Feeling of Stress : Only a little  Social Connections: Moderately Integrated (08/26/2023)   Social Connection and Isolation Panel    Frequency of Communication with Friends and Family: More than three times a week    Frequency of Social Gatherings with Friends and Family: Once a week    Attends Religious Services: More than 4 times per year    Active Member of [employer] or Organizations: Yes    Attends Banker Meetings: Never    Marital Status: Widowed  Intimate Partner Violence: Not At Risk (08/26/2023)   Humiliation, Afraid, Rape, and Kick questionnaire    Fear of Current or Ex-Partner: No    Emotionally Abused: No    Physically Abused: No    Sexually Abused: No    Family History:    Family  History  Problem Relation Age of Onset   Colon cancer Mother 63   Heart attack Mother    Other Father        suicide   Breast cancer Sister 28   Breast cancer Sister  76   Breast cancer Sister 22   Heart disease Sister    Heart disease Maternal Uncle        CABG   Heart disease Brother    COPD Brother    Heart disease Brother    Pancreatic cancer Other        brother's son, dx in his 55s   Breast cancer Niece 72     ROS:  Please see the history of present illness.   All other ROS reviewed and negative.     Physical Exam/Data: Vitals:   11/30/23 2003 12/01/23 0000 12/01/23 0446 12/01/23 0800  BP: 138/64 (!) 126/58 (!) 123/52 (!) 122/52  Pulse: 74 74 68 76  Resp: 17 18 16 14   Temp:  98 F (36.7 C) 98.3 F (36.8 C) 97.9 F (36.6 C)  TempSrc:  Oral Oral Oral  SpO2: 97% 96% 96% 96%  Weight:   54 kg   Height:        Intake/Output Summary (Last 24 hours) at 12/01/2023 1010 Last data filed at 12/01/2023 0824 Gross per 24 hour  Intake 300 ml  Output --  Net 300 ml      12/01/2023    4:46 AM 11/30/2023    5:00 PM 10/31/2023    1:04 PM  Last 3 Weights  Weight (lbs) 119 lb 0.8 oz 121 lb 0.5 oz 124 lb  Weight (kg) 54 kg 54.9 kg 56.246 kg     Body mass index is 21.09 kg/m.  General:  Well nourished, well developed, in no acute distress, alert and orientated. Appearing younger than stated age HEENT: normal Neck: no JVD Vascular: positive carotid bruit left greater than right; Distal pulses 2+ bilaterally Cardiac:  normal S1, S2; RRR; no murmur Lungs:  clear to auscultation bilaterally, no wheezing, rhonchi or rales  Abd: soft, nontender, no hepatomegaly  Ext: no edema Musculoskeletal:  No deformities. Skin: warm and dry  Neuro:  no focal abnormalities noted Psych:  Normal affect   EKG:  The EKG was personally reviewed and demonstrates:  normal sinus rhythm with a rate of 71, first-degree AV block, and left anterior fascicular block. Telemetry:  Telemetry was  personally reviewed and demonstrates:  multiple runs of 2:1 AV block the first happened yesterday at about 9pm the most recent happened when I was in the room talking to the patient (Ongoing). The longest run of 2:1 AV block lasted about 10 seconds. Patient denies any symptoms.     Relevant CV Studies: Echocardiogram on 12/01/23 1. Left ventricular ejection fraction, by estimation, is 60 to 65%. The  left ventricle has normal function. The left ventricle has no regional  wall motion abnormalities. Left ventricular diastolic parameters are  consistent with Grade I diastolic  dysfunction (impaired relaxation).   2. Right ventricular systolic function is normal. The right ventricular  size is normal.   3. Left atrial size was mildly dilated.   4. The mitral valve is rheumatic. No evidence of mitral valve  regurgitation. No evidence of mitral stenosis. The mean mitral valve  gradient is 4.0 mmHg.   5. The aortic valve is normal in structure. Aortic valve regurgitation is  not visualized. No aortic stenosis is present.   6. The inferior vena cava is normal in size with greater than 50%  respiratory variability, suggesting right atrial pressure of 3 mmHg.   Laboratory Data: High Sensitivity Troponin:   Recent Labs  Lab 11/30/23 0209 11/30/23 0440 11/30/23 0802 12/01/23 0249 12/01/23 0555  TROPONINIHS 22* 66* 98* 110* 92*     Chemistry Recent Labs  Lab 11/30/23 0209 11/30/23 0441 11/30/23 0930 12/01/23 0249  NA 140  --  137 137  K 2.6*  --  5.1 4.7  CL 118*  --  106 105  CO2 15*  --  23 24  GLUCOSE 118*  --  165* 127*  BUN 7*  --  9 11  CREATININE 0.62  --  0.86 0.92  CALCIUM  6.5*  --  9.9 9.8  MG  --  1.7  --   --   GFRNONAA >60  --  >60 58*  ANIONGAP 7  --  8 8    Recent Labs  Lab 11/30/23 0209  PROT 4.4*  ALBUMIN 2.3*  AST 15  ALT 8  ALKPHOS 43  BILITOT 0.7   Lipids No results for input(s): CHOL, TRIG, HDL, LABVLDL, LDLCALC, CHOLHDL in the last  168 hours.  Hematology Recent Labs  Lab 11/30/23 0209 12/01/23 0249  WBC 3.9* 4.8  RBC 3.02* 3.28*  HGB 9.8* 10.7*  HCT 29.5* 31.3*  MCV 97.7 95.4  MCH 32.5 32.6  MCHC 33.2 34.2  RDW 13.8 13.7  PLT 126* 147*   Thyroid  No results for input(s): TSH, FREET4 in the last 168 hours.  BNPNo results for input(s): BNP, PROBNP in the last 168 hours.  DDimer No results for input(s): DDIMER in the last 168 hours.  Radiology/Studies:  DG Chest 2 View Result Date: 11/30/2023 CLINICAL DATA:  Tachycardia EXAM: CHEST - 2 VIEW COMPARISON:  03/24/22 FINDINGS: Cardiac shadow is within normal limits. Aortic calcifications are noted. The lungs are well aerated bilaterally. No focal infiltrate or effusion is seen. No bony abnormality is noted. IMPRESSION: No active cardiopulmonary disease. Electronically Signed   By: Oneil Devonshire M.D.   On: 11/30/2023 02:44     Assessment and Plan: Tami Jacobson is a 89 y.o. female with a hx of breast cancer with radiation therapy on 12/2016, coronary calcifications, cerebrovascular disease, palpitations, PACs, and PVCs with normal LV function previously on flecainide  and metoprolol  who is being seen 12/01/2023 for the evaluation of elevated troponins at the request of Ava Swayze DO.  2:1 AV heart block History of PVCs and PACs PTA was on Metoprolol  succinate 50mg  daily and flecanide 50mg  twice daily for palpitations secondary to PVC's and PAC's.  It appears like the last time the patient wore a cardiac monitor was back in 2012. - EKG showed normal sinus rhythm with a rate of 71, first-degree AV block, and left anterior fascicular block. - Telemetry showed multiple runs of 2:1 AV block the longest lasted about 10 seconds.  Heart rates are generally in the 60s to 80s.  These episodes of 2:1 AV block happen during the day and night.  Patient denies any symptoms such as lightheadedness, and dizziness. - Echo this hospitalization showed a normal LVEF of 60-65%,  No RWMA, G1DD, normal RV function, mild pulmonic regurgitation, and a variable IVC.   Ordered flecanide, TSH, Mag Stop Metoprolol  Hold flecanide Reasonable to consider live 14 day zio monitor prior to discharge   Weakness Patient reported some weakness. This is likely related to the patients hypokalemia of 2.6. Potassium was replaced by primary.   Carotid bruit Carotid ultrasound in 2016 showed 1 to 39% stenosis bilaterally.  May consider outpatient carotid ultrasound.   Coronary calcifications Elevated high-sensitivity troponins 22 > 66 > 98 > 110 > 92. Coronary calcification seen on CT in 2021.  Nuclear stress test in 2017 was negative.  Denied any chest pain or shortness of breath.  Elevated troponins most consistent with demand ischemia from anemia, hypokalemia, or 2:1 AV block.   Otherwise managed per primary  Risk Assessment/Risk Scores:    For questions or updates, please contact [facility] Please consult [email or web address] for contact info under   Signed, Morse Clause, PA-C  12/01/2023 10:10 AM  Patient seen and examined, note reviewed with the signed Advanced Practice Provider. I personally reviewed laboratory data, imaging studies and relevant notes. I independently examined the patient and formulated the important aspects of the plan. I have personally discussed the plan with the patient and/or family. Comments or changes to the note/plan are indicated below.  Patient seen and examined at her bed.  She tells me she feels a lot better.  She also no significant complaint at this time.  GEN:  Well nourished, well developed in no acute distress HEENT: Mucous membranes moist, missing tooth NECK: No JVD; No carotid bruits LYMPHATICS: No lymphadenopathy CARDIAC: S1S2 noted, RRR, no murmurs, rubs, gallops RESPIRATORY:  Clear to auscultation without rales, wheezing or rhonchi  ABDOMEN: Soft, non-tender, non-distended, bowel sounds noted, no guarding EXTREMITIES:No  cyanosis, no cyanosis, no clubbing MUSCULOSKELETAL: No deformity  SKIN: Warm and dry NEUROLOGIC:  Alert and oriented x 3, nonfocal PSYCHIATRIC:  Normal affect, good insight   2-1 AV block History of PACs/PVCs Lecture light abnormality Coronary calcification Elevated troponin: Type II demand ischemia  Her EKG and monitor that showed evidence of 2-1 AV block.  Currently she is on a beta-blocker we will going to stop this.  She has been on flecainide  for very long time on clear flecainide  level we will hold this and get her flecainide  level. Get TSH as well as her magnesium  level today. I discussed with the patient in great detail about the findings as well as the implications in the future and potential for need for permanent pacemaker.  She is agreeable to wear the monitor but she makes it very clear that if there is a need for a pacemaker she will not be interested at her age in pursuing a pacemaker which I do think is unreasonable.  There is no clinical symptoms for angina at this time.  Do think her elevated troponin is type II mismatch no need for any further ischemic evaluation at this time.   Waverly Chavarria DO, MS [facility] Attending Cardiologist [facility]  [address] [place], [place] [address] [phone] Website: [email or web address]

## 2023-12-01 NOTE — Progress Notes (Signed)
 Progress Note   Patient: Tami Jacobson FMW:[id number] DOB: 1927-04-09 DOA: 11/30/2023     0 DOS: the patient was seen and examined on 12/01/2023   Brief hospital course: The patient is a 89 yr old woman who presented to [facility] ED on 11/30/2023 with complaints of feeling a throbbing in her throat. In the ED she was found to have a 2:1 AV block, hypokalemia, and hypomagnesemia. Overnight she was found to be bradycardic into the 30's. The patient has been on metoprolol  and flecainide  as outpatient. Troponins were [phone], 92. Echocardiogram has been ordered and cardiology is consulted.  Assessment and Plan: Severe hypokalemia Hypomagnesemia Presented with palpitation, anxiety feeling in the setting of low potassium level of 2.6 IV and oral replacement given. Potassium has corrected to 4.7. Magnesium  level also presented as low at 1.7. In the setting of known history of dysrhythmia I would like to keep magnesium  level above 2.  Replacement ordered. Will continue to follow.   Hypocalcemia Calcium  level low at 6.5 in the setting of low albuin of 2.3.  Corrected calcium  level is still low at 8. I had planned for calcium  gluconate but calcium  level on repeat BMP showed improvement to 9.8. Monitor.  Mild diarrhea Reports 2-3 bowel moods a day for last 1 week, not since yesterday.  It seems patient had loss of electrolytes as above due to diarrhea. Only one bowel movement on 8/27. Monitor.   Elevated troponin Troponins have increased from 22 initially to 66,98,110, and 92. Echocardiogram has been ordered and cardiology has been consulted.   Palpitations  h/o dysrhythmia Woke up with palpitation.  Likely secondary to low electrolytes and missing her meds.   By the time of her home heart rate was only 100s  EKG demonstrates 1st degree AV block. She was notably bradycardic into the 30's overnight. Metoprolol  and flecainade have been held, although she reportedly had run out of these meds  anyway. Anticipate cardiology's guidance here.    Hypertension Metoprolol  has been stopped due to bradycardia. Blood pressures are within range without antihypertensives.   Type 2 diabetes mellitus A1c 6.5 in July 2025 PTA meds- metformin . Will restart metformin  as dye requiring procedures are unlikely. Will follow glucoses with FSBS and SSI.  Mild pancytopenia All 3 cell lines are slightly depressed. Monitor.  Throat pain  Throat pain improved with viscous lidocaine . Respiratory viral panel is negative.    Mobility: Encourage ambulation PT Orders:    PT Follow up Rec:     Goals of care:   Code Status: Full Code    DVT prophylaxis:  enoxaparin  (LOVENOX ) injection 40 mg Start: 11/30/23 0830   Antimicrobials: None Fluid: None Consultants: None Family Communication: None at bedside       Subjective: The patient is resting comfortably. No new complaints.   Physical Exam: Vitals:   12/01/23 0000 12/01/23 0446 12/01/23 0800 12/01/23 1047  BP: (!) 126/58 (!) 123/52 (!) 122/52 (!) 141/66  Pulse: 74 68 76 69  Resp: 18 16 14 18   Temp: 98 F (36.7 C) 98.3 F (36.8 C) 97.9 F (36.6 C) 98.5 F (36.9 C)  TempSrc: Oral Oral Oral Oral  SpO2: 96% 96% 96% 98%  Weight:  54 kg    Height:       Exam:  Constitutional:  The patient is awake, alert, and oriented x 3. No acute distress. Respiratory:  No increased work of breathing. No wheezes, rales, or rhonchi No tactile fremitus Cardiovascular:  Regular rate and rhythm No  murmurs, ectopy, or gallups. No lateral PMI. No thrills. Abdomen:  Abdomen is soft, non-tender, non-distended No hernias, masses, or organomegaly Normoactive bowel sounds.  Musculoskeletal:  No cyanosis, clubbing, or edema Skin:  No rashes, lesions, ulcers palpation of skin: no induration or nodules Neurologic:  CN 2-12 intact Sensation all 4 extremities intact Psychiatric:  Mental status Mood, affect appropriate Orientation to person,  place, time  judgment and insight appear intact  Data Reviewed:  CBC CMP Troponin EKG  Family Communication: None available  Disposition: Status is: Observation The patient remains OBS appropriate and will d/c before 2 midnights.  [facility]    Time spent: 36 minutes  Author: Loveah Like, DO 12/01/2023 2:31 PM  For on call review [email or web address].

## 2023-12-01 NOTE — Plan of Care (Signed)
  Problem: Tissue Perfusion: Goal: Adequacy of tissue perfusion will improve Outcome: Progressing   Problem: Clinical Measurements: Goal: Cardiovascular complication will be avoided Outcome: Progressing   Problem: Activity: Goal: Risk for activity intolerance will decrease Outcome: Progressing   Problem: Elimination: Goal: Will not experience complications related to bowel motility Outcome: Progressing   Problem: Safety: Goal: Ability to remain free from injury will improve Outcome: Progressing   Problem: Skin Integrity: Goal: Risk for impaired skin integrity will decrease Outcome: Progressing

## 2023-12-01 NOTE — Progress Notes (Signed)
 Overnight floor coverage  Notified by RN that telemetry reported patient having episodes of bradycardia with heart rate as low as 33.  Patient is asymptomatic and blood pressure stable.  Stat EKG done and showing sinus rhythm with first-degree AV block, LAFB, and no significant change compared to previous EKGs.  Per review of chart, electrolytes were abnormal and replaced with improvement on repeat labs.  TSH was normal on labs a month ago.  Troponin had trended up on labs yesterday morning and patient has no known history of CAD.  Echocardiogram pending to rule out wall motion abnormalities.  -Metoprolol  discontinued -Check repeat troponin -Continue cardiac monitoring

## 2023-12-01 NOTE — Progress Notes (Signed)
   12/01/23 0600  Assess: MEWS Score  ECG Heart Rate 69  Assess: MEWS Score  MEWS Temp 0  MEWS Systolic 0  MEWS Pulse 0  MEWS RR 0  MEWS LOC 0  MEWS Score 0  MEWS Score Color Green  Provider Notification  Provider Name/Title Dr. Alfornia  Date Provider Notified 12/01/23  Time Provider Notified [phone]  Method of Notification Page (secure chat)  Notification Reason Critical Result (Troponin 110ng/L)  Provider response See new orders  Date of Provider Response 12/01/23  Time of Provider Response 0418  Assess: SIRS CRITERIA  SIRS Temperature  0  SIRS Respirations  0  SIRS Pulse 0  SIRS WBC 0  SIRS Score Sum  0

## 2023-12-02 ENCOUNTER — Other Ambulatory Visit

## 2023-12-02 ENCOUNTER — Encounter: Payer: Self-pay | Admitting: *Deleted

## 2023-12-02 ENCOUNTER — Other Ambulatory Visit: Payer: Self-pay | Admitting: Cardiology

## 2023-12-02 DIAGNOSIS — I4430 Unspecified atrioventricular block: Secondary | ICD-10-CM | POA: Diagnosis present

## 2023-12-02 DIAGNOSIS — I441 Atrioventricular block, second degree: Secondary | ICD-10-CM | POA: Diagnosis not present

## 2023-12-02 DIAGNOSIS — R7989 Other specified abnormal findings of blood chemistry: Secondary | ICD-10-CM | POA: Diagnosis not present

## 2023-12-02 DIAGNOSIS — E876 Hypokalemia: Secondary | ICD-10-CM | POA: Diagnosis not present

## 2023-12-02 LAB — BASIC METABOLIC PANEL WITH GFR
Anion gap: 10 (ref 5–15)
BUN: 10 mg/dL (ref 8–23)
CO2: 24 mmol/L (ref 22–32)
Calcium: 9.5 mg/dL (ref 8.9–10.3)
Chloride: 101 mmol/L (ref 98–111)
Creatinine, Ser: 0.95 mg/dL (ref 0.44–1.00)
GFR, Estimated: 56 mL/min — ABNORMAL LOW (ref >60)
Glucose, Bld: 107 mg/dL — ABNORMAL HIGH (ref 70–99)
Potassium: 4.5 mmol/L (ref 3.5–5.1)
Sodium: 135 mmol/L (ref 135–145)

## 2023-12-02 LAB — CBC WITH DIFFERENTIAL/PLATELET
Abs Immature Granulocytes: 0.01 K/uL (ref 0.00–0.07)
Basophils Absolute: 0 K/uL (ref 0.0–0.1)
Basophils Relative: 1 %
Eosinophils Absolute: 0.2 K/uL (ref 0.0–0.5)
Eosinophils Relative: 5 %
HCT: 29 % — ABNORMAL LOW (ref 36.0–46.0)
Hemoglobin: 9.9 g/dL — ABNORMAL LOW (ref 12.0–15.0)
Immature Granulocytes: 0 %
Lymphocytes Relative: 27 %
Lymphs Abs: 1.1 K/uL (ref 0.7–4.0)
MCH: 32.2 pg (ref 26.0–34.0)
MCHC: 34.1 g/dL (ref 30.0–36.0)
MCV: 94.5 fL (ref 80.0–100.0)
Monocytes Absolute: 0.6 K/uL (ref 0.1–1.0)
Monocytes Relative: 14 %
Neutro Abs: 2.2 K/uL (ref 1.7–7.7)
Neutrophils Relative %: 53 %
Platelets: 134 K/uL — ABNORMAL LOW (ref 150–400)
RBC: 3.07 MIL/uL — ABNORMAL LOW (ref 3.87–5.11)
RDW: 13.7 % (ref 11.5–15.5)
WBC: 4.1 K/uL (ref 4.0–10.5)
nRBC: 0 % (ref 0.0–0.2)

## 2023-12-02 LAB — GLUCOSE, CAPILLARY
Glucose-Capillary: 124 mg/dL — ABNORMAL HIGH (ref 70–99)
Glucose-Capillary: 179 mg/dL — ABNORMAL HIGH (ref 70–99)

## 2023-12-02 NOTE — Progress Notes (Unsigned)
 Enrolled for [employer] to mail a ZIO AT Live Telemetry monitor to patients address on file.   Letter with instructions mailed to patient.  Dr. Pietro to read.

## 2023-12-02 NOTE — Progress Notes (Signed)
 Mobility Specialist Progress Note:    12/02/23 1111  Mobility  Activity Ambulated with assistance  Level of Assistance Standby assist, set-up cues, supervision of patient - no hands on  Assistive Device Four wheel walker  Distance Ambulated (ft) 500 ft  Activity Response Tolerated well  Mobility Referral Yes  Mobility visit 1 Mobility  Mobility Specialist Start Time (ACUTE ONLY) 1111  Mobility Specialist Stop Time (ACUTE ONLY) 1124  Mobility Specialist Time Calculation (min) (ACUTE ONLY) 13 min   Pt pleasant and eager to session. No c/o any symptoms. Pt has light swaying to the right using the rollator but corrects self very quickly. Pt moving fast and well. Returned pt to chair w/ all needs met. RN notified.   Venetia Keel Mobility Specialist Please Neurosurgeon or Rehab Office at [phone]

## 2023-12-02 NOTE — Assessment & Plan Note (Signed)
 Likel due to 2:1 AV heart block. Flecainide  and metoprolol  stopped by cardiology. No PT needs identified.

## 2023-12-02 NOTE — Assessment & Plan Note (Signed)
 Flecainide  and metoprolol  stopped per cardiology. The patient will be on cardiac monitor for 14 days and follow up with cardiology in 4 weeks.

## 2023-12-02 NOTE — Assessment & Plan Note (Signed)
 Resolved. Potassium 4.5 on discharge.

## 2023-12-02 NOTE — Plan of Care (Signed)

## 2023-12-02 NOTE — Progress Notes (Signed)
 Ordering 14 day live Zio for heart block. Dr. Pietro to read.

## 2023-12-02 NOTE — Discharge Summary (Signed)
 Physician Discharge Summary   Patient: Tami Jacobson MRN: [id number] DOB: November 19, 1927  Admit date:     11/30/2023  Discharge date:   Discharge Physician: Brigida Bureau   PCP: Geofm Glade PARAS, MD   Recommendations at discharge:    Discharge to home with cardiac monitoring as per cardiology. Follow up with PCP in 7-10 days.  Follow up with cardiology in 4 weeks.  Discharge Diagnoses: Principal Problem:   Hypokalemia Active Problems:   Abnormal electrocardiogram   Atrial fibrillation (HCC)   Weakness generalized   AV heart block  Resolved: Hypokalemia Hospital Course: The patient is a 89 yr old woman who presented to [facility] ED on 11/30/2023 with complaints of feeling a throbbing in her throat. In the ED she was found to have a 2:1 AV block, hypokalemia, and hypomagnesemia. Overnight she was found to be bradycardic into the 30's. The patient has been on metoprolol  and flecainide  as outpatient. Troponins were [phone], 92. Echocardiogram has been ordered and cardiology is consulted.   Echocardiogram has demonstrated EF of 60-65% with normal function and no regional wall motion abnormalities. There was a Grade I diastolic dysfunction. RV systolic funtion was normal and RV size 3as normal. Cardiology discontinued the patient's metoprolol  and flecainide . They have also checked a flecainide  level which is still pending. They have set the patient up with a 14 day monitor.   The patient has been evaluated by PT/OT. They do not recommend any further PT intervention.  She will be discharged to home in fair condition.  Assessment and Plan: AV heart block Flecainide  and metoprolol  stopped per cardiology. The patient will be on cardiac monitor for 14 days and follow up with cardiology in 4 weeks.  Weakness generalized Likel due to 2:1 AV heart block. Flecainide  and metoprolol  stopped by cardiology. No PT needs identified.  Hypokalemia Resolved. Potassium 4.5 on discharge.  Abnormal  electrocardiogram 2:1 AV block.         Consultants: Cardiology Procedures performed: None  Disposition: Home Diet recommendation:  Discharge Diet Orders (From admission, onward)     Start     Ordered   12/02/23 0000  Diet - low sodium heart healthy        12/02/23 1502           Carb modified diet DISCHARGE MEDICATION: Allergies as of 12/02/2023       Reactions   Statins Other (See Comments)   GI UPSET   Effexor  [venlafaxine ]    Constipation, shaking, dizzy   Gabapentin     drowsniess   Cephalexin Other (See Comments)   Reaction not recalled by the patient   Codeine Other (See Comments)   Reaction not recalled by the patient   Lansoprazole Other (See Comments)   Reaction not recalled by the patient   Latex Rash      Levofloxacin Other (See Comments)   Reaction not recalled by the patient   Nitrofurantoin Other (See Comments)   Reaction not recalled by the patient   Rofecoxib Other (See Comments)   Reaction not recalled by the patient        Medication List     STOP taking these medications    amLODipine  2.5 MG tablet Commonly known as: NORVASC    flecainide  50 MG tablet Commonly known as: TAMBOCOR    losartan  100 MG tablet Commonly known as: COZAAR    metoprolol  succinate 50 MG 24 hr tablet Commonly known as: TOPROL -XL       TAKE these medications  Blood Glucose Monitoring Suppl Devi UAD to check sugars.  E11.9.  May substitute to any manufacturer covered by patient's insurance.   BLOOD GLUCOSE TEST STRIPS Strp UAD to check sugars daily and prn.  E11.9.  May substitute to any manufacturer covered by patient's insurance.   cyanocobalamin  1000 MCG tablet Commonly known as: VITAMIN B12 Take 1,000 mcg by mouth daily.   diphenhydramine-acetaminophen  25-500 MG Tabs tablet Commonly known as: TYLENOL  PM Take 1 tablet by mouth at bedtime as needed (for sleep).   Lancet Device Misc UAD for help check sugars.  E11.9.  May substitute to any  manufacturer covered by patient's insurance.   Lancets Misc. Misc UAD to check sugars daily and prn.  E11.9.  May substitute to any manufacturer covered by patient's insurance.   metFORMIN  500 MG 24 hr tablet Commonly known as: GLUCOPHAGE -XR TAKE 1 TABLET BY MOUTH DAILY  WITH BREAKFAST FOR YOUR SUGAR   omeprazole  40 MG capsule Commonly known as: PRILOSEC TAKE 1 CAPSULE BY MOUTH DAILY   Tylenol  8 Hour 650 MG CR tablet Generic drug: acetaminophen  Take 650 mg by mouth daily as needed for pain.        Discharge Exam: Filed Weights   11/30/23 1700 12/01/23 0446 12/02/23 0547  Weight: 54.9 kg 54 kg 54.1 kg   Exam:  Constitutional:  The patient is awake, alert, and oriented x 3. No acute distress. Respiratory:  No increased work of breathing. No wheezes, rales, or rhonchi No tactile fremitus Cardiovascular:  Regular rate and rhythm No murmurs, ectopy, or gallups. No lateral PMI. No thrills. Abdomen:  Abdomen is soft, non-tender, non-distended No hernias, masses, or organomegaly Normoactive bowel sounds.  Musculoskeletal:  No cyanosis, clubbing, or edema Skin:  No rashes, lesions, ulcers palpation of skin: no induration or nodules Neurologic:  CN 2-12 intact Sensation all 4 extremities intact Psychiatric:  Mental status Mood, affect appropriate Orientation to person, place, time  judgment and insight appear intact   Condition at discharge: fair  The results of significant diagnostics from this hospitalization (including imaging, microbiology, ancillary and laboratory) are listed below for reference.   Imaging Studies: ECHOCARDIOGRAM COMPLETE Result Date: 12/01/2023    ECHOCARDIOGRAM REPORT   Patient Name:   Tami Jacobson Date of Exam: 12/01/2023 Medical Rec #:  [id number]            Height:       63.0 in Accession #:    [id number]           Weight:       119.0 lb Date of Birth:  [birth year]            BSA:          1.551 m Patient Age:    89 years              BP:           122/52 mmHg Patient Gender: F                    HR:           72 bpm. Exam Location:  Inpatient Procedure: 2D Echo, Cardiac Doppler and Color Doppler (Both Spectral and Color            Flow Doppler were utilized during procedure). Indications:    Abnormal EKG R94.31  History:        Patient has prior history of Echocardiogram examinations, most  recent 03/20/2020. Arrythmias:PVC and PAC.  Sonographer:    BERNARDA ROCKS Referring Phys: [id number] BINAYA DAHAL IMPRESSIONS  1. Left ventricular ejection fraction, by estimation, is 60 to 65%. The left ventricle has normal function. The left ventricle has no regional wall motion abnormalities. Left ventricular diastolic parameters are consistent with Grade I diastolic dysfunction (impaired relaxation).  2. Right ventricular systolic function is normal. The right ventricular size is normal.  3. Left atrial size was mildly dilated.  4. The mitral valve is rheumatic. No evidence of mitral valve regurgitation. No evidence of mitral stenosis. The mean mitral valve gradient is 4.0 mmHg.  5. The aortic valve is normal in structure. Aortic valve regurgitation is not visualized. No aortic stenosis is present.  6. The inferior vena cava is normal in size with greater than 50% respiratory variability, suggesting right atrial pressure of 3 mmHg. FINDINGS  Left Ventricle: Left ventricular ejection fraction, by estimation, is 60 to 65%. The left ventricle has normal function. The left ventricle has no regional wall motion abnormalities. The left ventricular internal cavity size was normal in size. There is  no left ventricular hypertrophy. Left ventricular diastolic parameters are consistent with Grade I diastolic dysfunction (impaired relaxation). Right Ventricle: The right ventricular size is normal. No increase in right ventricular wall thickness. Right ventricular systolic function is normal. Left Atrium: Left atrial size was mildly dilated. Right  Atrium: Right atrial size was normal in size. Pericardium: There is no evidence of pericardial effusion. Mitral Valve: The mitral valve is rheumatic. There is mild thickening of the mitral valve leaflet(s). There is moderate calcification of the mitral valve leaflet(s). Moderately decreased mobility of the mitral valve leaflets. Mild mitral annular calcification. No evidence of mitral valve regurgitation. No evidence of mitral valve stenosis. MV peak gradient, 7.8 mmHg. The mean mitral valve gradient is 4.0 mmHg. Tricuspid Valve: The tricuspid valve is normal in structure. Tricuspid valve regurgitation is not demonstrated. No evidence of tricuspid stenosis. Aortic Valve: The aortic valve is normal in structure. Aortic valve regurgitation is not visualized. No aortic stenosis is present. Aortic valve mean gradient measures 3.0 mmHg. Aortic valve peak gradient measures 5.1 mmHg. Aortic valve area, by VTI measures 3.83 cm. Pulmonic Valve: The pulmonic valve was normal in structure. Pulmonic valve regurgitation is mild. No evidence of pulmonic stenosis. Aorta: The aortic root is normal in size and structure. Venous: The inferior vena cava is normal in size with greater than 50% respiratory variability, suggesting right atrial pressure of 3 mmHg. IAS/Shunts: No atrial level shunt detected by color flow Doppler.  LEFT VENTRICLE PLAX 2D LVIDd:         4.10 cm     Diastology LVIDs:         2.40 cm     LV e' medial:    6.64 cm/s LV PW:         0.90 cm     LV E/e' medial:  15.8 LV IVS:        1.10 cm     LV e' lateral:   8.16 cm/s LVOT diam:     2.00 cm     LV E/e' lateral: 12.9 LV SV:         105 LV SV Index:   67 LVOT Area:     3.14 cm  LV Volumes (MOD) LV vol d, MOD A2C: 92.5 ml LV vol d, MOD A4C: 50.1 ml LV vol s, MOD A2C: 19.7 ml LV vol s, MOD A4C: 15.8 ml  LV SV MOD A2C:     72.8 ml LV SV MOD A4C:     50.1 ml LV SV MOD BP:      51.4 ml RIGHT VENTRICLE RV Basal diam:  2.90 cm RV S prime:     14.50 cm/s TAPSE (M-mode):  2.8 cm LEFT ATRIUM             Index        RIGHT ATRIUM           Index LA diam:        2.60 cm 1.68 cm/m   RA Area:     12.60 cm LA Vol (A2C):   29.3 ml 18.89 ml/m  RA Volume:   26.10 ml  16.83 ml/m LA Vol (A4C):   54.4 ml 35.07 ml/m LA Biplane Vol: 42.3 ml 27.27 ml/m  AORTIC VALVE                    PULMONIC VALVE AV Area (Vmax):    3.25 cm     PV Vmax:          1.44 m/s AV Area (Vmean):   2.91 cm     PV Peak grad:     8.4 mmHg AV Area (VTI):     3.83 cm     PR End Diast Vel: 3.97 msec AV Vmax:           113.00 cm/s AV Vmean:          78.800 cm/s AV VTI:            0.273 m AV Peak Grad:      5.1 mmHg AV Mean Grad:      3.0 mmHg LVOT Vmax:         117.00 cm/s LVOT Vmean:        72.900 cm/s LVOT VTI:          0.333 m LVOT/AV VTI ratio: 1.22  AORTA Ao Root diam: 2.80 cm Ao Asc diam:  3.10 cm MITRAL VALVE                TRICUSPID VALVE MV Area (PHT): 2.71 cm     TR Peak grad:   30.7 mmHg MV Area VTI:   2.65 cm     TR Vmax:        277.00 cm/s MV Peak grad:  7.8 mmHg MV Mean grad:  4.0 mmHg     SHUNTS MV Vmax:       1.40 m/s     Systemic VTI:  0.33 m MV Vmean:      90.3 cm/s    Systemic Diam: 2.00 cm MV Decel Time: 280 msec MR Peak grad: 98.0 mmHg MR Vmax:      495.00 cm/s MV E velocity: 105.00 cm/s MV A velocity: 131.00 cm/s MV E/A ratio:  0.80 Aditya Sabharwal Electronically signed by Ria Commander Signature Date/Time: 12/01/2023/11:31:10 AM    Final    DG Chest 2 View Result Date: 11/30/2023 CLINICAL DATA:  Tachycardia EXAM: CHEST - 2 VIEW COMPARISON:  03/24/22 FINDINGS: Cardiac shadow is within normal limits. Aortic calcifications are noted. The lungs are well aerated bilaterally. No focal infiltrate or effusion is seen. No bony abnormality is noted. IMPRESSION: No active cardiopulmonary disease. Electronically Signed   By: Oneil Devonshire M.D.   On: 11/30/2023 02:44   MM 3D SCREENING MAMMOGRAM BILATERAL BREAST Result Date: 11/08/2023 CLINICAL DATA:  Screening. EXAM: DIGITAL SCREENING BILATERAL  MAMMOGRAM WITH TOMOSYNTHESIS  AND CAD TECHNIQUE: Bilateral screening digital craniocaudal and mediolateral oblique mammograms were obtained. Bilateral screening digital breast tomosynthesis was performed. The images were evaluated with computer-aided detection. COMPARISON:  Previous exam(s). ACR Breast Density Category c: The breasts are heterogeneously dense, which may obscure small masses. FINDINGS: There are no findings suspicious for malignancy. IMPRESSION: No mammographic evidence of malignancy. A result letter of this screening mammogram will be mailed directly to the patient. RECOMMENDATION: Screening mammogram in one year. (Code:SM-B-01Y) BI-RADS CATEGORY  1: Negative. Electronically Signed   By: Toribio Agreste M.D.   On: 11/08/2023 09:47    Microbiology: Results for orders placed or performed during the hospital encounter of 11/30/23  Group A Strep by PCR     Status: None   Collection Time: 11/30/23  1:53 AM   Specimen: Throat; Sterile Swab  Result Value Ref Range Status   Group A Strep by PCR NOT DETECTED NOT DETECTED Final    Comment: Performed at [facility], [address], [place], [place] [address]  Resp panel by RT-PCR (RSV, Flu A&B, Covid) Throat     Status: None   Collection Time: 11/30/23  4:17 AM   Specimen: Throat; Nasal Swab  Result Value Ref Range Status   SARS Coronavirus 2 by RT PCR NEGATIVE NEGATIVE Final   Influenza A by PCR NEGATIVE NEGATIVE Final   Influenza B by PCR NEGATIVE NEGATIVE Final    Comment: (NOTE) The Xpert Xpress SARS-CoV-2/FLU/RSV plus assay is intended as an aid in the diagnosis of influenza from Nasopharyngeal swab specimens and should not be used as a sole basis for treatment. Nasal washings and aspirates are unacceptable for Xpert Xpress SARS-CoV-2/FLU/RSV testing.  Fact Sheet for Patients: BloggerCourse.com  Fact Sheet for Healthcare Providers: SeriousBroker.it  This test is not yet  approved or cleared by the [place]  FDA and has been authorized for detection and/or diagnosis of SARS-CoV-2 by FDA under an Emergency Use Authorization (EUA). This EUA will remain in effect (meaning this test can be used) for the duration of the COVID-19 declaration under Section 564(b)(1) of the Act, 21 U.S.C. section 360bbb-3(b)(1), unless the authorization is terminated or revoked.     Resp Syncytial Virus by PCR NEGATIVE NEGATIVE Final    Comment: (NOTE) Fact Sheet for Patients: BloggerCourse.com  Fact Sheet for Healthcare Providers: SeriousBroker.it  This test is not yet approved or cleared by the [place]  FDA and has been authorized for detection and/or diagnosis of SARS-CoV-2 by FDA under an Emergency Use Authorization (EUA). This EUA will remain in effect (meaning this test can be used) for the duration of the COVID-19 declaration under Section 564(b)(1) of the Act, 21 U.S.C. section 360bbb-3(b)(1), unless the authorization is terminated or revoked.  Performed at [facility], [address] [place], [place] [address]     Labs: CBC: Recent Labs  Lab 11/30/23 [phone] 12/01/23 0249 12/02/23 0257  WBC 3.9* 4.8 4.1  NEUTROABS 2.8  --  2.2  HGB 9.8* 10.7* 9.9*  HCT 29.5* 31.3* 29.0*  MCV 97.7 95.4 94.5  PLT 126* 147* 134*   Basic Metabolic Panel: Recent Labs  Lab 11/30/23 0209 11/30/23 0441 11/30/23 0930 12/01/23 0249 12/01/23 1312 12/02/23 0257  NA 140  --  137 137  --  135  K 2.6*  --  5.1 4.7  --  4.5  CL 118*  --  106 105  --  101  CO2 15*  --  23 24  --  24  GLUCOSE 118*  --  165* 127*  --  107*  BUN 7*  --  9 11  --  10  CREATININE 0.62  --  0.86 0.92  --  0.95  CALCIUM  6.5*  --  9.9 9.8  --  9.5  MG  --  1.7  --   --  2.0  --    Liver Function Tests: Recent Labs  Lab 11/30/23 0209  AST 15  ALT 8  ALKPHOS 43  BILITOT 0.7  PROT 4.4*  ALBUMIN 2.3*   CBG: Recent Labs  Lab  12/01/23 1125 12/01/23 1556 12/01/23 2057 12/02/23 0553 12/02/23 1202  GLUCAP 169* 154* 126* 124* 179*    Discharge time spent: greater than 30 minutes.  Signed: Glenn Christo, DO [facility] 12/02/2023

## 2023-12-02 NOTE — Care Management Obs Status (Signed)
 MEDICARE OBSERVATION STATUS NOTIFICATION   Patient Details  Name: Tami Jacobson MRN: [id number] Date of Birth: 11-15-1927   Medicare Observation Status Notification Given:  Yes    Vonzell Arrie Sharps 12/02/2023, 8:03 AM

## 2023-12-02 NOTE — Progress Notes (Signed)
 Progress Note  Patient Name: Tami Jacobson Date of Encounter: 12/02/2023  Primary Cardiologist: Redell Shallow, MD   Subjective   Patient seen and examined at the bedside. She is awake and offers no complaints at this time.  Inpatient Medications    Scheduled Meds:  enoxaparin  (LOVENOX ) injection  40 mg Subcutaneous Q24H   insulin  aspart  0-5 Units Subcutaneous QHS   insulin  aspart  0-9 Units Subcutaneous TID WC   pantoprazole   40 mg Oral Daily   Continuous Infusions:  PRN Meds: acetaminophen  **OR** acetaminophen , albuterol , bisacodyl , hydrALAZINE , mouth rinse, polyethylene glycol   Vital Signs    Vitals:   12/01/23 1555 12/01/23 1946 12/02/23 0026 12/02/23 0547  BP:  (!) 117/57 (!) 115/56 (!) 129/58  Pulse: 70 74 66 73  Resp: 16 17 18 20   Temp: 98.2 F (36.8 C) (!) 97.4 F (36.3 C) (!) 97.5 F (36.4 C) 98 F (36.7 C)  TempSrc: Oral Oral Oral Oral  SpO2:  96% 96% 97%  Weight:    54.1 kg  Height:        Intake/Output Summary (Last 24 hours) at 12/02/2023 0934 Last data filed at 12/02/2023 0551 Gross per 24 hour  Intake 240 ml  Output 1 ml  Net 239 ml   Filed Weights   11/30/23 1700 12/01/23 0446 12/02/23 0547  Weight: 54.9 kg 54 kg 54.1 kg    Telemetry    Sinus rhythm HR 84 bpm wirh intermitted 2:1 AV block  - Personally Reviewed  ECG     None today but reviewed - Personally Reviewed  Physical Exam    General: Comfortable Head: Atraumatic, normal size  Eyes: PEERLA, EOMI  Neck: Supple, normal JVD Cardiac: Normal S1, S2; RRR; no murmurs, rubs, or gallops Lungs: Clear to auscultation bilaterally Abd: Soft, nontender, no hepatomegaly  Ext: warm, no edema Musculoskeletal: No deformities, BUE and BLE strength normal and equal Skin: Warm and dry, no rashes   Neuro: Alert and oriented to person, place, time, and situation, CNII-XII grossly intact, no focal deficits  Psych: Normal mood and affect   Labs    Chemistry Recent Labs  Lab  11/30/23 0209 11/30/23 0930 12/01/23 0249 12/02/23 0257  NA 140 137 137 135  K 2.6* 5.1 4.7 4.5  CL 118* 106 105 101  CO2 15* 23 24 24   GLUCOSE 118* 165* 127* 107*  BUN 7* 9 11 10   CREATININE 0.62 0.86 0.92 0.95  CALCIUM  6.5* 9.9 9.8 9.5  PROT 4.4*  --   --   --   ALBUMIN 2.3*  --   --   --   AST 15  --   --   --   ALT 8  --   --   --   ALKPHOS 43  --   --   --   BILITOT 0.7  --   --   --   GFRNONAA >60 >60 58* 56*  ANIONGAP 7 8 8 10      Hematology Recent Labs  Lab 11/30/23 0209 12/01/23 0249 12/02/23 0257  WBC 3.9* 4.8 4.1  RBC 3.02* 3.28* 3.07*  HGB 9.8* 10.7* 9.9*  HCT 29.5* 31.3* 29.0*  MCV 97.7 95.4 94.5  MCH 32.5 32.6 32.2  MCHC 33.2 34.2 34.1  RDW 13.8 13.7 13.7  PLT 126* 147* 134*    Cardiac EnzymesNo results for input(s): TROPONINI in the last 168 hours. No results for input(s): TROPIPOC in the last 168 hours.   BNPNo results for  input(s): BNP, PROBNP in the last 168 hours.   DDimer No results for input(s): DDIMER in the last 168 hours.   Radiology    ECHOCARDIOGRAM COMPLETE Result Date: 12/01/2023    ECHOCARDIOGRAM REPORT   Patient Name:   Tami Jacobson Date of Exam: 12/01/2023 Medical Rec #:  [id number]            Height:       63.0 in Accession #:    [id number]           Weight:       119.0 lb Date of Birth:  1928/02/21            BSA:          1.551 m Patient Age:    89 years             BP:           122/52 mmHg Patient Gender: F                    HR:           72 bpm. Exam Location:  Inpatient Procedure: 2D Echo, Cardiac Doppler and Color Doppler (Both Spectral and Color            Flow Doppler were utilized during procedure). Indications:    Abnormal EKG R94.31  History:        Patient has prior history of Echocardiogram examinations, most                 recent 03/20/2020. Arrythmias:PVC and PAC.  Sonographer:    BERNARDA ROCKS Referring Phys: [id number] BINAYA DAHAL IMPRESSIONS  1. Left ventricular ejection fraction, by estimation, is 60  to 65%. The left ventricle has normal function. The left ventricle has no regional wall motion abnormalities. Left ventricular diastolic parameters are consistent with Grade I diastolic dysfunction (impaired relaxation).  2. Right ventricular systolic function is normal. The right ventricular size is normal.  3. Left atrial size was mildly dilated.  4. The mitral valve is rheumatic. No evidence of mitral valve regurgitation. No evidence of mitral stenosis. The mean mitral valve gradient is 4.0 mmHg.  5. The aortic valve is normal in structure. Aortic valve regurgitation is not visualized. No aortic stenosis is present.  6. The inferior vena cava is normal in size with greater than 50% respiratory variability, suggesting right atrial pressure of 3 mmHg. FINDINGS  Left Ventricle: Left ventricular ejection fraction, by estimation, is 60 to 65%. The left ventricle has normal function. The left ventricle has no regional wall motion abnormalities. The left ventricular internal cavity size was normal in size. There is  no left ventricular hypertrophy. Left ventricular diastolic parameters are consistent with Grade I diastolic dysfunction (impaired relaxation). Right Ventricle: The right ventricular size is normal. No increase in right ventricular wall thickness. Right ventricular systolic function is normal. Left Atrium: Left atrial size was mildly dilated. Right Atrium: Right atrial size was normal in size. Pericardium: There is no evidence of pericardial effusion. Mitral Valve: The mitral valve is rheumatic. There is mild thickening of the mitral valve leaflet(s). There is moderate calcification of the mitral valve leaflet(s). Moderately decreased mobility of the mitral valve leaflets. Mild mitral annular calcification. No evidence of mitral valve regurgitation. No evidence of mitral valve stenosis. MV peak gradient, 7.8 mmHg. The mean mitral valve gradient is 4.0 mmHg. Tricuspid Valve: The tricuspid valve is normal in  structure. Tricuspid valve regurgitation is not  demonstrated. No evidence of tricuspid stenosis. Aortic Valve: The aortic valve is normal in structure. Aortic valve regurgitation is not visualized. No aortic stenosis is present. Aortic valve mean gradient measures 3.0 mmHg. Aortic valve peak gradient measures 5.1 mmHg. Aortic valve area, by VTI measures 3.83 cm. Pulmonic Valve: The pulmonic valve was normal in structure. Pulmonic valve regurgitation is mild. No evidence of pulmonic stenosis. Aorta: The aortic root is normal in size and structure. Venous: The inferior vena cava is normal in size with greater than 50% respiratory variability, suggesting right atrial pressure of 3 mmHg. IAS/Shunts: No atrial level shunt detected by color flow Doppler.  LEFT VENTRICLE PLAX 2D LVIDd:         4.10 cm     Diastology LVIDs:         2.40 cm     LV e' medial:    6.64 cm/s LV PW:         0.90 cm     LV E/e' medial:  15.8 LV IVS:        1.10 cm     LV e' lateral:   8.16 cm/s LVOT diam:     2.00 cm     LV E/e' lateral: 12.9 LV SV:         105 LV SV Index:   67 LVOT Area:     3.14 cm  LV Volumes (MOD) LV vol d, MOD A2C: 92.5 ml LV vol d, MOD A4C: 50.1 ml LV vol s, MOD A2C: 19.7 ml LV vol s, MOD A4C: 15.8 ml LV SV MOD A2C:     72.8 ml LV SV MOD A4C:     50.1 ml LV SV MOD BP:      51.4 ml RIGHT VENTRICLE RV Basal diam:  2.90 cm RV S prime:     14.50 cm/s TAPSE (M-mode): 2.8 cm LEFT ATRIUM             Index        RIGHT ATRIUM           Index LA diam:        2.60 cm 1.68 cm/m   RA Area:     12.60 cm LA Vol (A2C):   29.3 ml 18.89 ml/m  RA Volume:   26.10 ml  16.83 ml/m LA Vol (A4C):   54.4 ml 35.07 ml/m LA Biplane Vol: 42.3 ml 27.27 ml/m  AORTIC VALVE                    PULMONIC VALVE AV Area (Vmax):    3.25 cm     PV Vmax:          1.44 m/s AV Area (Vmean):   2.91 cm     PV Peak grad:     8.4 mmHg AV Area (VTI):     3.83 cm     PR End Diast Vel: 3.97 msec AV Vmax:           113.00 cm/s AV Vmean:          78.800 cm/s AV  VTI:            0.273 m AV Peak Grad:      5.1 mmHg AV Mean Grad:      3.0 mmHg LVOT Vmax:         117.00 cm/s LVOT Vmean:        72.900 cm/s LVOT VTI:          0.333  m LVOT/AV VTI ratio: 1.22  AORTA Ao Root diam: 2.80 cm Ao Asc diam:  3.10 cm MITRAL VALVE                TRICUSPID VALVE MV Area (PHT): 2.71 cm     TR Peak grad:   30.7 mmHg MV Area VTI:   2.65 cm     TR Vmax:        277.00 cm/s MV Peak grad:  7.8 mmHg MV Mean grad:  4.0 mmHg     SHUNTS MV Vmax:       1.40 m/s     Systemic VTI:  0.33 m MV Vmean:      90.3 cm/s    Systemic Diam: 2.00 cm MV Decel Time: 280 msec MR Peak grad: 98.0 mmHg MR Vmax:      495.00 cm/s MV E velocity: 105.00 cm/s MV A velocity: 131.00 cm/s MV E/A ratio:  0.80 Aditya Sabharwal Electronically signed by Ria Commander Signature Date/Time: 12/01/2023/11:31:10 AM    Final     Cardiac Studies   Echo   Patient Profile     89 y.o. female  with a hx of breast cancer with radiation therapy on 12/2016, coronary calcifications, cerebrovascular disease, palpitations, PACs, and PVCs . Assessment & Plan    2:1 AV heart block  History of PVC /PACs Weakness  Carotid bruit  Coronary calcification  Elevated high-sensitivity troponin 22>66>98>110>92   She still have evidence of 2:1 AV nodal - holding her beta blocker for now. Her flecainide  level is pending.  TSH level is normal.  For now hold the flecainide  and the beta blocker.   She is agreeable to wear the monitor but she makes it very clear that if there is a need for a pacemaker she will not be interested at her age in pursuing a pacemaker which I do think is unreasonable.   There is no clinical symptoms for angina at this time.  Do think her elevated troponin is type II mismatch no need for any further ischemic evaluation at this time.    For questions or updates, please contact [facility] Please consult [email or web address] for contact info under Cardiology/STEMI.      Signed, Virdell Hoiland, DO  12/02/2023, 9:34  AM

## 2023-12-02 NOTE — Progress Notes (Signed)
 Patient has received her discharge papers and is currently waiting for a friend to pick her up. Patient has no further questions or concerns.

## 2023-12-02 NOTE — Assessment & Plan Note (Signed)
 2:1 AV block.

## 2023-12-02 NOTE — TOC Transition Note (Signed)
 Transition of Care [facility]) - Discharge Note   Patient Details  Name: Tami Jacobson MRN: [id number] Date of Birth: [birth year]  Transition of Care [facility]) CM/SW Contact:  Waddell Barnie Rama, RN Phone Number: 12/02/2023, 3:18 PM   Clinical Narrative:    For dc today, her friend will transport her home today, Cards will put zio patch on prior to dc.          Patient Goals and CMS Choice            Discharge Placement                       Discharge Plan and Services Additional resources added to the After Visit Summary for                                       Social Drivers of Health (SDOH) Interventions SDOH Screenings   Food Insecurity: No Food Insecurity (12/01/2023)  Housing: Unknown (12/01/2023)  Transportation Needs: No Transportation Needs (12/01/2023)  Utilities: Not At Risk (12/01/2023)  Alcohol Screen: Low Risk  (08/26/2023)  Depression (PHQ2-9): Low Risk  (10/31/2023)  Financial Resource Strain: Low Risk  (08/26/2023)  Physical Activity: Insufficiently Active (08/26/2023)  Social Connections: Moderately Integrated (12/01/2023)  Stress: No Stress Concern Present (08/26/2023)  Tobacco Use: Low Risk  (11/30/2023)  Health Literacy: Adequate Health Literacy (08/26/2023)     Readmission Risk Interventions     No data to display

## 2023-12-02 NOTE — Plan of Care (Signed)
   Problem: Coping: Goal: Ability to adjust to condition or change in health will improve Outcome: Progressing   Problem: Fluid Volume: Goal: Ability to maintain a balanced intake and output will improve Outcome: Progressing   Problem: Metabolic: Goal: Ability to maintain appropriate glucose levels will improve Outcome: Progressing

## 2023-12-02 NOTE — Plan of Care (Signed)
   Problem: Coping: Goal: Ability to adjust to condition or change in health will improve Outcome: Progressing   Problem: Safety: Goal: Ability to remain free from injury will improve Outcome: Progressing

## 2023-12-02 NOTE — TOC CM/SW Note (Signed)
 Transition of Care [facility]) - Inpatient Brief Assessment   Patient Details  Name: Tami Jacobson MRN: [id number] Date of Birth: 1927/05/25  Transition of Care [facility]) CM/SW Contact:    Waddell Barnie Rama, RN Phone Number: 12/02/2023, 3:16 PM   Clinical Narrative: From home with friend, has PCP and insurance on file, states has no HH services in place at this time, has a rollator at home.  States her POA or her friend's son  will transport them home at [employer] and POA, Alexis, [employer],  is support system, states gets medications from [facility] [facility].  Pta self ambulatory with rollator.   There are no ICM needs identified  at this time.  Please place consult for ICM needs.     Transition of Care Asessment: Insurance and Status: Insurance coverage has been reviewed Patient has primary care physician: Yes Home environment has been reviewed: from [facility] Prior level of function:: ambulatory with rollator Prior/Current Home Services: Current home services (rollator) Social Drivers of Health Review: SDOH reviewed no interventions necessary Readmission risk has been reviewed: Yes Transition of care needs: no transition of care needs at this time

## 2023-12-06 ENCOUNTER — Encounter: Payer: Self-pay | Admitting: Internal Medicine

## 2023-12-06 NOTE — Progress Notes (Unsigned)
 Subjective:    Patient ID: Tami Jacobson, female    DOB: [birth year], 89 y.o.   MRN: [id number]     HPI Tami Jacobson is here for follow up of her recent hospitalization.  She is also having cold symptoms.  Admitted 8/27-8/29  She presented to the emergency room with complaints of throbbing in her throat.  She was weak and shaky.  In the emergency room she was found to have a 2: 1 AV block, hypokalemia and hypomagnesemia.  Overnight she was found to be bradycardic into the 30s.  She was on metoprolol  and flecainide  as an outpatient, but was out of her metoprolol  for a week ana a half.  Troponins trended up-22-110.  Cardiology was consulted.  Echocardiogram showed EF 60-65% with no RWMA, grade 1 DD, RV normal.  Metoprolol  and flecainide  discontinued.  14-day Holter monitor ordered.  Amlodipine  and losartan  also discontinued.  Generalized weakness-likely related to 2: 1 AV block.  PT, OT evaluated her and did not recommend further PT/OT.  Hypokalemia, hypomagnesemia-resolved  She feels weak and tired and that has not changed since her medications were stopped.    BP at home 131/77, 131/75 the past two days.    Two weeks ago had some nausea, but did not vomit.  Has not had that since then.  She denies any abdominal pain.  She does sometimes have a couple of bowel movements after eating breakfast.  Sometimes the stools are loose, but not always.  She does feel like she has frequent bowel movements.  Medications and allergies reviewed with patient and updated if appropriate.  Current Outpatient Medications on File Prior to Visit  Medication Sig Dispense Refill   acetaminophen  (TYLENOL  8 HOUR) 650 MG CR tablet Take 650 mg by mouth daily as needed for pain.     Blood Glucose Monitoring Suppl DEVI UAD to check sugars.  E11.9.  May substitute to any manufacturer covered by patient's insurance. 1 each 0   cyanocobalamin  (VITAMIN B12) 1000 MCG tablet Take 1,000 mcg by mouth daily.      diphenhydramine-acetaminophen  (TYLENOL  PM) 25-500 MG TABS tablet Take 1 tablet by mouth at bedtime as needed (for sleep).     Glucose Blood (BLOOD GLUCOSE TEST STRIPS) STRP UAD to check sugars daily and prn.  E11.9.  May substitute to any manufacturer covered by patient's insurance. 100 strip 3   Lancet Device MISC UAD for help check sugars.  E11.9.  May substitute to any manufacturer covered by patient's insurance. 1 each 0   Lancets Misc. MISC UAD to check sugars daily and prn.  E11.9.  May substitute to any manufacturer covered by patient's insurance. 100 each 3   metFORMIN  (GLUCOPHAGE -XR) 500 MG 24 hr tablet TAKE 1 TABLET BY MOUTH DAILY  WITH BREAKFAST FOR YOUR SUGAR 100 tablet 2   omeprazole  (PRILOSEC) 40 MG capsule TAKE 1 CAPSULE BY MOUTH DAILY 100 capsule 2   No current facility-administered medications on file prior to visit.     Review of Systems  Constitutional:  Positive for fatigue (and weak). Negative for appetite change and fever.  HENT:  Positive for postnasal drip and rhinorrhea.   Respiratory:  Positive for cough (occ from drainage). Negative for shortness of breath and wheezing.   Cardiovascular:  Negative for chest pain, palpitations and leg swelling.  Gastrointestinal:  Negative for abdominal pain.  Genitourinary:  Positive for frequency.  Neurological:  Positive for dizziness (if she gets up too quick). Negative for light-headedness  and headaches.       Objective:   Vitals:   12/07/23 1012  BP: (!) 140/60  Pulse: (!) 104  Temp: 98 F (36.7 C)  SpO2: 99%   BP Readings from Last 3 Encounters:  12/07/23 (!) 140/60  12/02/23 (!) 124/98  10/31/23 110/68   Wt Readings from Last 3 Encounters:  12/07/23 121 lb (54.9 kg)  12/02/23 119 lb 4.3 oz (54.1 kg)  10/31/23 124 lb (56.2 kg)   Body mass index is 21.43 kg/m.    Physical Exam Constitutional:      General: She is not in acute distress.    Appearance: Normal appearance.  HENT:     Head: Normocephalic  and atraumatic.  Eyes:     Conjunctiva/sclera: Conjunctivae normal.  Cardiovascular:     Rate and Rhythm: Normal rate and regular rhythm.     Heart sounds: Murmur (3/6 sys) heard.  Pulmonary:     Effort: Pulmonary effort is normal. No respiratory distress.     Breath sounds: Normal breath sounds. No wheezing.  Abdominal:     General: There is no distension.     Palpations: Abdomen is soft.     Tenderness: There is no abdominal tenderness.  Musculoskeletal:     Cervical back: Neck supple.     Right lower leg: No edema.     Left lower leg: No edema.  Lymphadenopathy:     Cervical: No cervical adenopathy.  Skin:    General: Skin is warm and dry.     Findings: No rash.  Neurological:     Mental Status: She is alert. Mental status is at baseline.  Psychiatric:        Mood and Affect: Mood normal.        Behavior: Behavior normal.        Lab Results  Component Value Date   WBC 4.1 12/02/2023   HGB 9.9 (L) 12/02/2023   HCT 29.0 (L) 12/02/2023   PLT 134 (L) 12/02/2023   GLUCOSE 107 (H) 12/02/2023   CHOL 178 10/31/2023   TRIG 222.0 (H) 10/31/2023   HDL 40.70 10/31/2023   LDLDIRECT 128.3 12/26/2012   LDLCALC 93 10/31/2023   ALT 8 11/30/2023   AST 15 11/30/2023   NA 135 12/02/2023   K 4.5 12/02/2023   CL 101 12/02/2023   CREATININE 0.95 12/02/2023   BUN 10 12/02/2023   CO2 24 12/02/2023   TSH 2.066 12/01/2023   INR 1.0 11/30/2023   HGBA1C 6.5 10/31/2023     Assessment & Plan:    See Problem List for Assessment and Plan of chronic medical problems.

## 2023-12-06 NOTE — Patient Instructions (Addendum)
    Have blood work done today    Medications changes include :   None     Return for follow up as scheduled.

## 2023-12-07 ENCOUNTER — Ambulatory Visit: Admitting: Internal Medicine

## 2023-12-07 VITALS — BP 140/60 | HR 104 | Temp 98.0°F | Ht 63.0 in | Wt 121.0 lb

## 2023-12-07 DIAGNOSIS — I4430 Unspecified atrioventricular block: Secondary | ICD-10-CM

## 2023-12-07 DIAGNOSIS — K219 Gastro-esophageal reflux disease without esophagitis: Secondary | ICD-10-CM | POA: Diagnosis not present

## 2023-12-07 DIAGNOSIS — E118 Type 2 diabetes mellitus with unspecified complications: Secondary | ICD-10-CM | POA: Diagnosis not present

## 2023-12-07 DIAGNOSIS — E876 Hypokalemia: Secondary | ICD-10-CM | POA: Diagnosis not present

## 2023-12-07 DIAGNOSIS — I10 Essential (primary) hypertension: Secondary | ICD-10-CM | POA: Diagnosis not present

## 2023-12-07 DIAGNOSIS — Z7984 Long term (current) use of oral hypoglycemic drugs: Secondary | ICD-10-CM | POA: Diagnosis not present

## 2023-12-07 DIAGNOSIS — Z23 Encounter for immunization: Secondary | ICD-10-CM

## 2023-12-07 DIAGNOSIS — D649 Anemia, unspecified: Secondary | ICD-10-CM | POA: Diagnosis not present

## 2023-12-07 DIAGNOSIS — R194 Change in bowel habit: Secondary | ICD-10-CM

## 2023-12-07 LAB — CBC WITH DIFFERENTIAL/PLATELET
Basophils Absolute: 0 K/uL (ref 0.0–0.1)
Basophils Relative: 0.5 % (ref 0.0–3.0)
Eosinophils Absolute: 0.1 K/uL (ref 0.0–0.7)
Eosinophils Relative: 2 % (ref 0.0–5.0)
HCT: 33.4 % — ABNORMAL LOW (ref 36.0–46.0)
Hemoglobin: 11.3 g/dL — ABNORMAL LOW (ref 12.0–15.0)
Lymphocytes Relative: 14.2 % (ref 12.0–46.0)
Lymphs Abs: 0.8 K/uL (ref 0.7–4.0)
MCHC: 34 g/dL (ref 30.0–36.0)
MCV: 94.4 fl (ref 78.0–100.0)
Monocytes Absolute: 0.6 K/uL (ref 0.1–1.0)
Monocytes Relative: 10.2 % (ref 3.0–12.0)
Neutro Abs: 4.2 K/uL (ref 1.4–7.7)
Neutrophils Relative %: 73.1 % (ref 43.0–77.0)
Platelets: 197 K/uL (ref 150.0–400.0)
RBC: 3.54 Mil/uL — ABNORMAL LOW (ref 3.87–5.11)
RDW: 14.1 % (ref 11.5–15.5)
WBC: 5.8 K/uL (ref 4.0–10.5)

## 2023-12-07 LAB — FLECAINIDE LEVEL: Flecainide: 0.54 ug/mL (ref 0.20–1.00)

## 2023-12-07 NOTE — Assessment & Plan Note (Signed)
 Chronic  Lab Results  Component Value Date   HGBA1C 6.5 10/31/2023   Sugars controlled Continue metformin  XR 500 mg daily Encouraged regular exercise, diabetic diet

## 2023-12-07 NOTE — Assessment & Plan Note (Signed)
 Chronic GERD controlled Continue omeprazole 40 mg daily

## 2023-12-07 NOTE — Assessment & Plan Note (Signed)
Resolved in hospital 

## 2023-12-07 NOTE — Addendum Note (Signed)
 Addended by: CLAUDENE TOBIAS PARAS on: 12/07/2023 01:36 PM   Modules accepted: Orders

## 2023-12-07 NOTE — Assessment & Plan Note (Signed)
 Acute on chronic Has chronic anemia that has been normocytic-likely from chronic disease Has gotten worse recently, but this was when she was in the hospital Recheck CBC today May need to consider hematology referral

## 2023-12-07 NOTE — Assessment & Plan Note (Signed)
 Subacute She is experiencing frequent bowel movements.  Sometimes they are loose.  She sometimes has to go a couple of times after eating breakfast No nausea, abdominal pain She would like to try something to help with the frequent bowel movements Advised doing 1-2 tablespoons of Benefiber daily to see if that helps

## 2023-12-07 NOTE — Assessment & Plan Note (Addendum)
 Chronic Metoprolol , amlodipine  and losartan  discontinued in the hospital BP well-controlled at home in the last couple of days and is acceptable here Advised that at this point she does not need any medication Has cardiology follow-up later this month

## 2023-12-07 NOTE — Assessment & Plan Note (Addendum)
 New He will follow-up with cardiology as an outpatient Metoprolol  and flecainide  discontinued Has cardiac monitor for 2 weeks-being put on here today

## 2023-12-08 ENCOUNTER — Ambulatory Visit: Payer: Self-pay | Admitting: Internal Medicine

## 2023-12-09 ENCOUNTER — Ambulatory Visit: Payer: Self-pay | Admitting: Student

## 2023-12-09 ENCOUNTER — Inpatient Hospital Stay: Admitting: Internal Medicine

## 2023-12-09 ENCOUNTER — Telehealth: Payer: Self-pay | Admitting: Cardiology

## 2023-12-09 NOTE — Telephone Encounter (Signed)
 Patient returned RN's call regarding results.

## 2023-12-09 NOTE — Telephone Encounter (Signed)
 Returned patient's call regarding the Results:  Adams, Zane, PA-C to [facility] Triage    12/09/23 10:57 AM Result Note Can someone please contact the patient to discuss these results. I already tried to contact the patient but was unable to reach her.  Her flecanide level was within the normal reference range. This is reassuring. We will not make any changes to her medications at this time. It looks like the patient is currently wearing a cardiac monitor and has a follow up visit on 01/02/24 with [facility] which we will keep.    Thanks    Zane Adams PA-C Flecainide  level   This message was given to the pt. She was confused about taking her Flecainide  and Metoprolol . And also the Amlodipine  and Losartan . All meds were d/c by provider during recent hospital admission. She is currently wearing a 14 day heart monitor (has 11 more days left, per pt). She recently saw her PCP on 12/07/23 and they advised her that she currently does not need any medications. Informed pt about this (to NOT take the above stated meds until otherwise advised by Cardiology) and she verbalized understanding. She does continue to check her BP once daily and it has been WNL. Advised pt to call us  back if she experiences any negative symptoms such as headaches, chest pains, dizziness/lightheadedness; she verbalized understanding. She lives at [facility] and dislikes the food they provide/prepare there. She has felt better the last few days. Has OV on 01/02/24 with Rana, NP. No other concerns for now.

## 2023-12-09 NOTE — Telephone Encounter (Signed)
 Left message for patient to call back

## 2023-12-09 NOTE — Telephone Encounter (Signed)
 Patient is calling back to get the results. States she would like to get a call back today. Please advise

## 2023-12-09 NOTE — Telephone Encounter (Signed)
Pt returning nurses call regarding results. Please advise 

## 2023-12-26 ENCOUNTER — Other Ambulatory Visit: Payer: Self-pay | Admitting: Internal Medicine

## 2024-01-02 ENCOUNTER — Ambulatory Visit: Attending: Emergency Medicine | Admitting: Emergency Medicine

## 2024-01-02 ENCOUNTER — Encounter: Payer: Self-pay | Admitting: Emergency Medicine

## 2024-01-02 VITALS — BP 132/64 | HR 93 | Ht 63.0 in | Wt 124.6 lb

## 2024-01-02 DIAGNOSIS — I6523 Occlusion and stenosis of bilateral carotid arteries: Secondary | ICD-10-CM

## 2024-01-02 DIAGNOSIS — R002 Palpitations: Secondary | ICD-10-CM | POA: Diagnosis not present

## 2024-01-02 DIAGNOSIS — E876 Hypokalemia: Secondary | ICD-10-CM | POA: Diagnosis not present

## 2024-01-02 DIAGNOSIS — I10 Essential (primary) hypertension: Secondary | ICD-10-CM | POA: Diagnosis not present

## 2024-01-02 DIAGNOSIS — I441 Atrioventricular block, second degree: Secondary | ICD-10-CM

## 2024-01-02 DIAGNOSIS — I2510 Atherosclerotic heart disease of native coronary artery without angina pectoris: Secondary | ICD-10-CM

## 2024-01-02 DIAGNOSIS — I4430 Unspecified atrioventricular block: Secondary | ICD-10-CM | POA: Diagnosis not present

## 2024-01-02 NOTE — Progress Notes (Signed)
 Cardiology Office Note:    Date:  01/02/2024  ID:  Fia, Hebert December 04, 1927, MRN [id number] PCP: Geofm Glade PARAS, MD  [facility] Providers Cardiologist:  Redell Shallow, MD Cardiology APP:  Rana Lum CROME, NP       Patient Profile:       Chief Complaint: Hospital follow-up History of Present Illness:  Tami Jacobson is a 89 y.o. female with visit-pertinent history of breast cancer with radiation therapy on 12/2016, coronary calcifications, cerebrovascular disease, palpitations, PACs and PVCs with normal LV function previously on flecainide  and metoprolol   She has remote history of having a cardiac catheterization done in 2000 and found normal coronary arteries.  Chest CT on 02/2020 showed coronary calcifications and aortic atherosclerosis.  Patient wore a Holter monitor before 2013 that showed PVCs.  These PVCs were correlated with patient's symptoms.  She was originally placed on beta-blockers for palpitations but continued to have symptoms.  Due to this she was placed on flecainide .  She had a nuclear stress test completed 05/2015 that showed normal wall motion, normal perfusion and overall low risk study.  Echocardiogram completed 03/2020 showed LVEF 60 to 65%, no RWMA, severe concentric LVH, grade 2 DD, mild left atrial dilation, mild MR, and mild to moderate pulmonic regurgitation.  On 12/2022 patient had a mechanical fall and developed a subdural hematoma.  Patient was recently admitted to the hospital on 8/27.  She had presented for throat pain and weakness.  On arrival she was found to be hypokalemic with potassium of 2.6.  Patient's telemetry showed that she was bradycardiac and having episodes of 2:1 AV block.  Echocardiogram showed LVEF 60 to 65%, no RWMA, grade 1 DD, normal RV function, mild pulmonic regurgitation.  Her PTA flecainide  and metoprolol  were held at discharge.  Her amlodipine  as well as losartan  were also discontinued at discharge.  She was  discharged home on 14-day cardiac monitor.   Discussed the use of AI scribe software for clinical note transcription with the patient, who gave verbal consent to proceed.  History of Present Illness Tami Jacobson is a 89 year old female with palpitations who presents for follow-up.  Today patient is doing well without acute cardiovascular concerns.  She felt weak at the time of hospitalization but reports improvement in weakness since discontinuing the medications.  She now feels good but gets tired easily, attributing this to her age. She walks daily for about 25 minutes using a walker and does not experience chest pain, shortness of breath, lightheadedness, or dizziness unless she turns quickly. She has never experienced syncope or presyncope. She feels sleepy despite sleeping nine hours a night, which she attributes to inactivity.  She monitors her blood pressure and blood sugar regularly at home, with her blood pressure usually around 129/60 and her blood sugar being stable.   She denies orthopnea, PND, LE, hematochezia, melena   Review of systems:  Please see the history of present illness. All other systems are reviewed and otherwise negative.      Studies Reviewed:    EKG Interpretation Date/Time:  Monday January 02 2024 14:18:02 EDT Ventricular Rate:  93 PR Interval:  182 QRS Duration:  86 QT Interval:  354 QTC Calculation: 440 R Axis:   -77  Text Interpretation: Normal sinus rhythm Low voltage QRS Left anterior fascicular block Inferior infarct (cited on or before 01-Dec-2023) Possible Anterolateral infarct , age undetermined When compared with ECG of 01-Dec-2023 02:17, No significant change was found Confirmed  by Rana Dixon [phone]) on 01/02/2024 4:41:08 PM   Echocardiogram 12/01/2023 1. Left ventricular ejection fraction, by estimation, is 60 to 65%. The  left ventricle has normal function. The left ventricle has no regional  wall motion abnormalities.  Left ventricular diastolic parameters are  consistent with Grade I diastolic  dysfunction (impaired relaxation).   2. Right ventricular systolic function is normal. The right ventricular  size is normal.   3. Left atrial size was mildly dilated.   4. The mitral valve is rheumatic. No evidence of mitral valve  regurgitation. No evidence of mitral stenosis. The mean mitral valve  gradient is 4.0 mmHg.   5. The aortic valve is normal in structure. Aortic valve regurgitation is  not visualized. No aortic stenosis is present.   6. The inferior vena cava is normal in size with greater than 50%  respiratory variability, suggesting right atrial pressure of 3 mmHg.   Echocardiogram 03/20/2020  1. Left ventricular ejection fraction, by estimation, is 60 to 65%. The  left ventricle has normal function. The left ventricle has no regional  wall motion abnormalities. There is severe concentric left ventricular  hypertrophy. Left ventricular diastolic   parameters are consistent with Grade II diastolic dysfunction  (pseudonormalization).   2. Right ventricular systolic function is normal. The right ventricular  size is normal.   3. Left atrial size was mildly dilated.   4. The mitral valve is grossly normal. Mild mitral valve regurgitation.   5. The aortic valve is tricuspid. Aortic valve regurgitation is not  visualized. No aortic stenosis is present.   6. There are two eccentric jets of pulmonic insufficiency.   7. The inferior vena cava is normal in size with greater than 50%  respiratory variability, suggesting right atrial pressure of 3 mmHg.   Echocardiogram 05/11/2019  1. Left ventricular ejection fraction, by visual estimation, is 55 to  60%. The left ventricle has normal function. Left ventricular septal wall  thickness was mildly increased. Mildly increased left ventricular  posterior wall thickness. There is mildly  increased left ventricular hypertrophy.   2. Elevated left ventricular  end-diastolic pressure.   3. Left ventricular diastolic parameters are consistent with Grade I  diastolic dysfunction (impaired relaxation).   4. The left ventricle has no regional wall motion abnormalities.   5. Global right ventricle has normal systolic function.The right  ventricular size is normal. No increase in right ventricular wall  thickness.   6. Left atrial size was mildly dilated.   7. Right atrial size was normal.   8. The mitral valve is normal in structure. No evidence of mitral valve  regurgitation. No evidence of mitral stenosis.   9. The tricuspid valve is normal in structure.  10. The tricuspid valve is normal in structure. Tricuspid valve  regurgitation is mild.  11. The aortic valve is normal in structure. Aortic valve regurgitation is  not visualized. No evidence of aortic valve sclerosis or stenosis.  12. The pulmonic valve was normal in structure. Pulmonic valve  regurgitation is not visualized.  13. Normal pulmonary artery systolic pressure.  14. The inferior vena cava is normal in size with greater than 50%  respiratory variability, suggesting right atrial pressure of 3 mmHg.   Carotid duplex 05/25/2016 Heterogeneous plaque, bilaterally Stable 1-39% R ICA stenosis Stable 1-39% LICA stenosis With normal subclavian arteries, bilaterally Patent vertebral arteries with antegrade flow  Lexiscan  Myoview 05/13/2015 The left ventricular ejection fraction is hyperdynamic (>65%). Nuclear stress EF: 72%. This is a  low risk study.   Normal perfusion. EF 72% with normal wall motion. This is a low risk study.   Risk Assessment/Calculations:              Physical Exam:   VS:  BP 132/64 (BP Location: Left Arm, Patient Position: Sitting, Cuff Size: Normal)   Pulse 93   Ht 5' 3 (1.6 m)   Wt 124 lb 9.6 oz (56.5 kg)   SpO2 91%   BMI 22.07 kg/m    Wt Readings from Last 3 Encounters:  01/02/24 124 lb 9.6 oz (56.5 kg)  12/07/23 121 lb (54.9 kg)  12/02/23 119 lb  4.3 oz (54.1 kg)    GEN: Well nourished, well developed in no acute distress NECK: No JVD; No carotid bruits CARDIAC: RRR, no murmurs, rubs, gallops RESPIRATORY:  Clear to auscultation without rales, wheezing or rhonchi  ABDOMEN: Soft, non-tender, non-distended EXTREMITIES:  No edema; No acute deformity      Assessment and Plan:  Coronary artery disease Coronary calcification seen on CT in 2021 Nuclear stress test in 2017 was low risk study Elevated troponins during recent admission most consistent with demand ischemia from anemia, hypokalemia or 2:1 AV block - Today she is stable without anginal symptoms.  She walks 25 minutes daily without exertional dyspnea or chest pains.  No indication further ischemic evaluation at this time - She is intolerant to statins  2:1 AV block She had evidence of 2:1 AV block on telemetry during admission 11/2023.  These episodes occurred during the day and at night.  The longest run of AV block lasted about 10 seconds.  She was asymptomatic.  Her PTA flecainide  and metoprolol  were discontinued as she was taking these for history of PVCs and PACs.  Discharged on 14-day monitor which is pending - Flecainide  level 11/2023 was WNL - Today patient remains asymptomatic without any lightheadedness, dizziness, syncope or presyncope - She walks 25 minutes a day on her walker without any exertional symptoms - She has made it clear today that she is not interested in pursuing a pacemaker which is reasonable given her age - Will review heart monitor results once available - Today she remains stable.  No changes to current regimen  Hypertension Blood pressure today is well-controlled at 132/64 Home blood pressure average 130s over 70s Her amlodipine  2.5 mg daily, losartan  100 mg daily, and metoprolol  succinate 50 mg daily were subsequently discontinued during admission 11/2023 - Today her blood pressure remains well-controlled on no  antihypertensives  Hypokalemia Potassium 2.6 on recent admission.  Resolved at discharge with potassium of 4.5 - Repeat BMET today  Palpitations Previous heart monitor demonstrated PACs and PVCs She was managed on flecainide  and metoprolol  succinate as based on previous notes her palpitations are not controlled on beta-blocker alone.  As noted above flecainide  and metoprolol  discontinued due to 2:1 AV block - Today she reports no palpitations - Well-controlled no change in current therapy  Carotid artery disease Carotid ultrasound in 2016 showed 1-39% stenosis bilaterally - She is with no acute neurological symptoms - She does not prefer further routine surveillance at this time - She is intolerant to statins  T2DM A1c 6.5% in 10/2023 - Managed on metformin       Dispo:  Return in about 3 months (around 04/02/2024).  Signed, Lum LITTIE Louis, NP

## 2024-01-02 NOTE — Patient Instructions (Addendum)
 Medication Instructions:  NO CHANGES   Lab Work: BMET AND MAGNESIUM  TO BE DONE TODAY.   Testing/Procedures: NONE  Follow-Up: At [facility], you and your health needs are our priority.  As part of our continuing mission to provide you with exceptional heart care, our providers are all part of one team.  This team includes your primary Cardiologist (physician) and Advanced Practice Providers or APPs (Physician Assistants and Nurse Practitioners) who all work together to provide you with the care you need, when you need it.  Your next appointment:    3 MONTHS  Provider:   Redell Shallow, MD    We recommend signing up for the patient portal called MyChart.  Sign up information is provided on this After Visit Summary.  MyChart is used to connect with patients for Virtual Visits (Telemedicine).  Patients are able to view lab/test results, encounter notes, upcoming appointments, etc.  Non-urgent messages can be sent to your provider as well.   To learn more about what you can do with MyChart, go to [email or web address].

## 2024-01-03 LAB — BASIC METABOLIC PANEL WITH GFR
BUN/Creatinine Ratio: 16 (ref 12–28)
BUN: 18 mg/dL (ref 10–36)
CO2: 20 mmol/L (ref 20–29)
Calcium: 10 mg/dL (ref 8.7–10.3)
Chloride: 101 mmol/L (ref 96–106)
Creatinine, Ser: 1.13 mg/dL — ABNORMAL HIGH (ref 0.57–1.00)
Glucose: 132 mg/dL — ABNORMAL HIGH (ref 70–99)
Potassium: 4.3 mmol/L (ref 3.5–5.2)
Sodium: 136 mmol/L (ref 134–144)
eGFR: 45 mL/min/1.73 — ABNORMAL LOW (ref >59)

## 2024-01-03 LAB — MAGNESIUM: Magnesium: 2 mg/dL (ref 1.6–2.3)

## 2024-01-05 ENCOUNTER — Ambulatory Visit: Payer: Self-pay | Admitting: Emergency Medicine

## 2024-01-22 NOTE — Progress Notes (Unsigned)
    Subjective:    Patient ID: Tami Jacobson, female    DOB: 16-Apr-1927, 89 y.o.   MRN: [id number]      HPI Tami Jacobson is here for No chief complaint on file.  Problems with bowels and update on Cardiology appt.  Taken off metoprolol  and flecainide  due to AV block - does not want PPM.        Medications and allergies reviewed with patient and updated if appropriate.  Current Outpatient Medications on File Prior to Visit  Medication Sig Dispense Refill   acetaminophen  (TYLENOL  8 HOUR) 650 MG CR tablet Take 650 mg by mouth daily as needed for pain.     cyanocobalamin  (VITAMIN B12) 1000 MCG tablet Take 1,000 mcg by mouth daily.     diphenhydramine-acetaminophen  (TYLENOL  PM) 25-500 MG TABS tablet Take 1 tablet by mouth at bedtime as needed (for sleep).     Glucose Blood (BLOOD GLUCOSE TEST STRIPS) STRP UAD to check sugars daily and prn.  E11.9.  May substitute to any manufacturer covered by patient's insurance. (Patient not taking: Reported on 01/02/2024) 100 strip 3   Lancet Device MISC UAD for help check sugars.  E11.9.  May substitute to any manufacturer covered by patient's insurance. (Patient not taking: Reported on 01/02/2024) 1 each 0   Lancets Misc. MISC UAD to check sugars daily and prn.  E11.9.  May substitute to any manufacturer covered by patient's insurance. (Patient not taking: Reported on 01/02/2024) 100 each 3   metFORMIN  (GLUCOPHAGE -XR) 500 MG 24 hr tablet TAKE 1 TABLET BY MOUTH DAILY  WITH BREAKFAST FOR YOUR SUGAR 100 tablet 2   omeprazole  (PRILOSEC) 40 MG capsule TAKE 1 CAPSULE BY MOUTH DAILY 100 capsule 2   Specialty Vitamins Products (NERVIVE NERVE RELIEF) TABS Take 1 tablet by mouth daily.     No current facility-administered medications on file prior to visit.    Review of Systems     Objective:  There were no vitals filed for this visit. BP Readings from Last 3 Encounters:  01/02/24 132/64  12/07/23 (!) 140/60  12/02/23 (!) 124/98   Wt Readings from  Last 3 Encounters:  01/02/24 124 lb 9.6 oz (56.5 kg)  12/07/23 121 lb (54.9 kg)  12/02/23 119 lb 4.3 oz (54.1 kg)   There is no height or weight on file to calculate BMI.    Physical Exam         Assessment & Plan:    See Problem List for Assessment and Plan of chronic medical problems.

## 2024-01-23 ENCOUNTER — Ambulatory Visit (INDEPENDENT_AMBULATORY_CARE_PROVIDER_SITE_OTHER): Admitting: Internal Medicine

## 2024-01-23 ENCOUNTER — Encounter: Payer: Self-pay | Admitting: Internal Medicine

## 2024-01-23 VITALS — BP 140/84 | HR 86 | Temp 98.1°F | Ht 63.0 in | Wt 122.0 lb

## 2024-01-23 DIAGNOSIS — E119 Type 2 diabetes mellitus without complications: Secondary | ICD-10-CM

## 2024-01-23 DIAGNOSIS — E1169 Type 2 diabetes mellitus with other specified complication: Secondary | ICD-10-CM | POA: Diagnosis not present

## 2024-01-23 DIAGNOSIS — G629 Polyneuropathy, unspecified: Secondary | ICD-10-CM | POA: Insufficient documentation

## 2024-01-23 DIAGNOSIS — K219 Gastro-esophageal reflux disease without esophagitis: Secondary | ICD-10-CM | POA: Diagnosis not present

## 2024-01-23 DIAGNOSIS — Z7984 Long term (current) use of oral hypoglycemic drugs: Secondary | ICD-10-CM | POA: Diagnosis not present

## 2024-01-23 DIAGNOSIS — K5904 Chronic idiopathic constipation: Secondary | ICD-10-CM | POA: Diagnosis not present

## 2024-01-23 DIAGNOSIS — E785 Hyperlipidemia, unspecified: Secondary | ICD-10-CM | POA: Diagnosis not present

## 2024-01-23 DIAGNOSIS — G6289 Other specified polyneuropathies: Secondary | ICD-10-CM

## 2024-01-23 MED ORDER — DULOXETINE HCL 20 MG PO CPEP: 20.0000 mg | ORAL_CAPSULE | Freq: Every day | ORAL | 1 refills | Status: DC

## 2024-01-23 NOTE — Assessment & Plan Note (Addendum)
 New Started about 6 months B/l feet - plantar surface > dorsal Did not tolerated gabapentin  in the past Start duloxetine 20 mg daily

## 2024-01-23 NOTE — Assessment & Plan Note (Signed)
 Chronic GERD controlled Continue omeprazole 40 mg daily

## 2024-01-23 NOTE — Assessment & Plan Note (Signed)
 Chronic Controlled with lifestyle - intolerant of statins and PCSK9

## 2024-01-23 NOTE — Assessment & Plan Note (Addendum)
 Chronic Associated with hyperlipidemia  Lab Results  Component Value Date   HGBA1C 6.5 10/31/2023   Sugars controlled Continue metformin  XR 500 mg daily Encouraged walking more, diabetic diet

## 2024-01-23 NOTE — Patient Instructions (Addendum)
       Medications changes include :   duloxetine 20 mg nightly       Return for follow up as scheduled.

## 2024-01-23 NOTE — Assessment & Plan Note (Signed)
 Chronic Her bowels symptoms were likely related to constipation Advised treating her constipation more to prevent this from occurring again -- taking a stool softener - may need to adjust dose

## 2024-01-26 ENCOUNTER — Ambulatory Visit: Admitting: Podiatry

## 2024-01-30 DIAGNOSIS — Z85828 Personal history of other malignant neoplasm of skin: Secondary | ICD-10-CM | POA: Diagnosis not present

## 2024-01-30 DIAGNOSIS — L814 Other melanin hyperpigmentation: Secondary | ICD-10-CM | POA: Diagnosis not present

## 2024-01-30 DIAGNOSIS — L570 Actinic keratosis: Secondary | ICD-10-CM | POA: Diagnosis not present

## 2024-01-30 DIAGNOSIS — L905 Scar conditions and fibrosis of skin: Secondary | ICD-10-CM | POA: Diagnosis not present

## 2024-01-30 DIAGNOSIS — L821 Other seborrheic keratosis: Secondary | ICD-10-CM | POA: Diagnosis not present

## 2024-01-30 DIAGNOSIS — L720 Epidermal cyst: Secondary | ICD-10-CM | POA: Diagnosis not present

## 2024-01-31 DIAGNOSIS — Z961 Presence of intraocular lens: Secondary | ICD-10-CM | POA: Diagnosis not present

## 2024-01-31 DIAGNOSIS — H52203 Unspecified astigmatism, bilateral: Secondary | ICD-10-CM | POA: Diagnosis not present

## 2024-01-31 DIAGNOSIS — E119 Type 2 diabetes mellitus without complications: Secondary | ICD-10-CM | POA: Diagnosis not present

## 2024-01-31 DIAGNOSIS — H353131 Nonexudative age-related macular degeneration, bilateral, early dry stage: Secondary | ICD-10-CM | POA: Diagnosis not present

## 2024-01-31 DIAGNOSIS — H5000 Unspecified esotropia: Secondary | ICD-10-CM | POA: Diagnosis not present

## 2024-01-31 LAB — OPHTHALMOLOGY REPORT-SCANNED

## 2024-02-27 ENCOUNTER — Ambulatory Visit: Admitting: Podiatry

## 2024-02-27 ENCOUNTER — Encounter: Payer: Self-pay | Admitting: Podiatry

## 2024-02-27 DIAGNOSIS — M79674 Pain in right toe(s): Secondary | ICD-10-CM | POA: Diagnosis not present

## 2024-02-27 DIAGNOSIS — E118 Type 2 diabetes mellitus with unspecified complications: Secondary | ICD-10-CM

## 2024-02-27 DIAGNOSIS — M79675 Pain in left toe(s): Secondary | ICD-10-CM

## 2024-02-27 DIAGNOSIS — B351 Tinea unguium: Secondary | ICD-10-CM

## 2024-02-27 NOTE — Progress Notes (Signed)
 This patient returns to my office for at risk foot care.  This patient requires this care by a professional since this patient will be at risk due to having diabetes and thrombocytopenia. This patient is unable to cut nails herself since the patient cannot reach her nails.These nails are painful walking and wearing shoes.  This patient presents for at risk foot care today.  General Appearance  Alert, conversant and in no acute stress.  Vascular  Dorsalis pedis and posterior tibial  pulses are  weakly palpable  bilaterally.  Capillary return is within normal limits  bilaterally. Cold feet. bilaterally.  Neurologic  Senn-Weinstein monofilament wire test within normal limits  bilaterally. Muscle power within normal limits bilaterally.  Nails Thick disfigured discolored nails with subungual debris  from hallux to fifth toes bilaterally. No evidence of bacterial infection or drainage bilaterally.  Orthopedic  No limitations of motion  feet .  No crepitus or effusions noted.  No bony pathology or digital deformities noted.  Skin  normotropic skin with no porokeratosis noted bilaterally.  No signs of infections or ulcers noted.     Onychomycosis  Pain in right toes  Pain in left toes  Consent was obtained for treatment procedures.   Mechanical debridement of nails 1-5  bilaterally performed with a nail nipper.  Filed with dremel without incident.    Return office visit    3 months                  Told patient to return for periodic foot care and evaluation due to potential at risk complications.   Helane Gunther DPM

## 2024-03-06 ENCOUNTER — Ambulatory Visit: Admitting: Cardiology

## 2024-03-28 NOTE — Progress Notes (Signed)
 "    HPI: FU palpitations and cerebrovascular disease; history of PACs and PVCs with normal LV function. She had significant palpitations in the past treated with beta-blockade and flecainide  (symptoms were not controlled with beta-blocker alone).  A previous catheterization in June 2000 showed normal coronary arteries and normal LV function. Nuclear study February 2017 showed ejection fraction 72% and normal perfusion. Carotid dopplers 2/18 showed 1-39 bilateral stenosis. Chest CT April 2021 showed biapical scarring and coronary artery calcification. Patient fell in September 2024 resulting in subdural hematoma.  Echocardiogram August 2025 showed normal LV function, grade 1 diastolic dysfunction, mild left atrial enlargement.  Patient noted to have 2-1 AV block on telemetry during admission August 2025.  Flecainide  and metoprolol  discontinued.  Since I last saw her, she occasionally feels her heart flutter when she is excited but otherwise denies dyspnea, chest pain or syncope.  Current Outpatient Medications  Medication Sig Dispense Refill   cyanocobalamin  (VITAMIN B12) 1000 MCG tablet Take 1,000 mcg by mouth daily.     diphenhydramine-acetaminophen  (TYLENOL  PM) 25-500 MG TABS tablet Take 1 tablet by mouth at bedtime as needed (for sleep).     metFORMIN  (GLUCOPHAGE -XR) 500 MG 24 hr tablet TAKE 1 TABLET BY MOUTH DAILY  WITH BREAKFAST FOR YOUR SUGAR 100 tablet 2   Multiple Vitamin (MULTIVITAMIN) tablet Take 1 tablet by mouth daily.     omeprazole  (PRILOSEC) 40 MG capsule TAKE 1 CAPSULE BY MOUTH DAILY 100 capsule 2   No current facility-administered medications for this visit.     Past Medical History:  Diagnosis Date   Arthritis    left hip   Breast cancer (HCC)    Breast cancer, right (HCC) 11/2016   Family history of breast cancer    Family history of colon cancer    Family history of pancreatic cancer    GERD (gastroesophageal reflux disease)    Non-insulin  dependent type 2 diabetes  mellitus (HCC)    PAC (premature atrial contraction)    Personal history of radiation therapy 12/2016   Premature ventricular contraction    Seasonal allergies    Sensitive skin    Vitamin B12 deficiency     Past Surgical History:  Procedure Laterality Date   ABDOMINAL HYSTERECTOMY  1979   partial   BREAST BIOPSY  07/02/2011   Procedure: BREAST BIOPSY WITH NEEDLE LOCALIZATION;  Surgeon: Alm VEAR Angle, MD;  Location: [facility] OR;  Service: General;  Laterality: Left;  left breast atypical hyperplasia needle localization biopsy   BREAST BIOPSY Left 06/13/2006   BREAST BIOPSY Right 06/27/2008   BREAST BIOPSY Right 09/27/2016   BREAST EXCISIONAL BIOPSY Left 07/02/2011   BREAST LUMPECTOMY Left 07/04/2006   BREAST LUMPECTOMY Right 06/26/2008   BREAST LUMPECTOMY Right 09/27/2016   BREAST LUMPECTOMY WITH RADIOACTIVE SEED LOCALIZATION Right 11/18/2016   Procedure: RIGHT BREAST LUMPECTOMY WITH RADIOACTIVE SEED LOCALIZATION ERAS PATHWAY;  Surgeon: Angle Alm, MD;  Location: [facility];  Service: General;  Laterality: Right;  ERAS PATHWAY   CATARACT EXTRACTION Bilateral    CHOLECYSTECTOMY, LAPAROSCOPIC  07/07/2001   had appendex taking out at the same time   ESOPHAGEAL DILATION  2006   LAPAROSCOPIC APPENDECTOMY  07/07/2001   TONSILLECTOMY  1942    Social History   Socioeconomic History   Marital status: Divorced    Spouse name: Debby   Number of children: 1   Years of education: Not on file   Highest education level: Not on file  Occupational History   Occupation: retire  Tobacco Use   Smoking status: Never   Smokeless tobacco: Never  Vaping Use   Vaping status: Never Used  Substance and Sexual Activity   Alcohol use: No    Alcohol/week: 0.0 standard drinks of alcohol   Drug use: No   Sexual activity: Not Currently  Other Topics Concern   Not on file  Social History Narrative   Lives with a friend-per pt/2025   Social Drivers of Health   Tobacco Use: Low  Risk (04/06/2024)   Patient History    Smoking Tobacco Use: Never    Smokeless Tobacco Use: Never    Passive Exposure: Not on file  Financial Resource Strain: Low Risk (08/26/2023)   Overall Financial Resource Strain (CARDIA)    Difficulty of Paying Living Expenses: Not hard at all  Food Insecurity: No Food Insecurity (12/01/2023)   Epic    Worried About Radiation Protection Practitioner of Food in the Last Year: Never true    Ran Out of Food in the Last Year: Never true  Transportation Needs: No Transportation Needs (12/01/2023)   Epic    Lack of Transportation (Medical): No    Lack of Transportation (Non-Medical): No  Physical Activity: Insufficiently Active (08/26/2023)   Exercise Vital Sign    Days of Exercise per Week: 7 days    Minutes of Exercise per Session: 10 min  Stress: No Stress Concern Present (08/26/2023)   [employer] of Occupational Health - Occupational Stress Questionnaire    Feeling of Stress : Only a little  Social Connections: Moderately Integrated (12/01/2023)   Social Connection and Isolation Panel    Frequency of Communication with Friends and Family: More than three times a week    Frequency of Social Gatherings with Friends and Family: Once a week    Attends Religious Services: More than 4 times per year    Active Member of [employer] or Organizations: Yes    Attends Banker Meetings: Never    Marital Status: Widowed  Intimate Partner Violence: Not At Risk (12/01/2023)   Epic    Fear of Current or Ex-Partner: No    Emotionally Abused: No    Physically Abused: No    Sexually Abused: No  Depression (PHQ2-9): Low Risk (12/07/2023)   Depression (PHQ2-9)    PHQ-2 Score: 0  Alcohol Screen: Low Risk (08/26/2023)   Alcohol Screen    Last Alcohol Screening Score (AUDIT): 0  Housing: Unknown (12/01/2023)   Epic    Unable to Pay for Housing in the Last Year: No    Number of Times Moved in the Last Year: Not on file    Homeless in the Last Year: No  Utilities: Not At Risk  (12/01/2023)   Epic    Threatened with loss of utilities: No  Health Literacy: Adequate Health Literacy (08/26/2023)   B1300 Health Literacy    Frequency of need for help with medical instructions: Never    Family History  Problem Relation Age of Onset   Colon cancer Mother 31   Heart attack Mother    Other Father        suicide   Breast cancer Sister 107   Breast cancer Sister 46   Breast cancer Sister 48   Heart disease Sister    Heart disease Maternal Uncle        CABG   Heart disease Brother    COPD Brother    Heart disease Brother    Pancreatic cancer Other  brother's son, dx in his 75s   Breast cancer Niece 45    ROS: no fevers or chills, productive cough, hemoptysis, dysphasia, odynophagia, melena, hematochezia, dysuria, hematuria, rash, seizure activity, orthopnea, PND, pedal edema, claudication. Remaining systems are negative.  Physical Exam: Well-developed well-nourished in no acute distress.  Skin is warm and dry.  HEENT is normal.  Neck is supple.  Chest is clear to auscultation with normal expansion.  Cardiovascular exam is regular rate and rhythm.  2/6 systolic murmur left sternal border. Abdominal exam nontender or distended. No masses palpated. Extremities show no edema. neuro grossly intact  EKG Interpretation Date/Time:  Friday April 06 2024 09:54:54 EST Ventricular Rate:  82 PR Interval:  168 QRS Duration:  92 QT Interval:  388 QTC Calculation: 453 R Axis:   -56  Text Interpretation: Normal sinus rhythm Left axis deviation Inferior-posterior infarct Confirmed by Pietro Rogue ([id number]) on 04/06/2024 9:55:48 AM    A/P  1 coronary artery disease-based on previous CT showing coronary calcification.  She denies chest pain.  She is intolerant to statins.  2 palpitations-previous monitor showed PACs and PVCs.  Beta-blocker and flecainide  discontinued previously secondary to bradycardia.  Symptoms are reasonable.  Will follow.  3  hypertension-patient's blood pressure is controlled.    Rogue Pietro, MD    "

## 2024-04-06 ENCOUNTER — Encounter: Payer: Self-pay | Admitting: Cardiology

## 2024-04-06 ENCOUNTER — Ambulatory Visit: Attending: Cardiology | Admitting: Cardiology

## 2024-04-06 VITALS — BP 124/60 | HR 82 | Ht 63.0 in | Wt 123.1 lb

## 2024-04-06 DIAGNOSIS — R002 Palpitations: Secondary | ICD-10-CM | POA: Diagnosis not present

## 2024-04-06 DIAGNOSIS — I10 Essential (primary) hypertension: Secondary | ICD-10-CM | POA: Diagnosis not present

## 2024-04-06 NOTE — Patient Instructions (Signed)

## 2024-05-02 ENCOUNTER — Ambulatory Visit: Admitting: Internal Medicine

## 2024-05-11 ENCOUNTER — Ambulatory Visit: Admitting: Internal Medicine

## 2024-05-15 ENCOUNTER — Ambulatory Visit: Admitting: Internal Medicine

## 2024-05-28 ENCOUNTER — Ambulatory Visit: Admitting: Podiatry

## 2024-08-29 ENCOUNTER — Ambulatory Visit
